# Patient Record
Sex: Male | Born: 1960 | Race: Black or African American | Hispanic: No | Marital: Single | Smoking: Current every day smoker
Health system: Southern US, Community
[De-identification: ages and names within clinical notes are randomized; demographics above are authoritative.]

## PROBLEM LIST (undated history)

## (undated) DIAGNOSIS — M86171 Other acute osteomyelitis, right ankle and foot: Secondary | ICD-10-CM

## (undated) DIAGNOSIS — Z86718 Personal history of other venous thrombosis and embolism: Secondary | ICD-10-CM

## (undated) DIAGNOSIS — F329 Major depressive disorder, single episode, unspecified: Secondary | ICD-10-CM

## (undated) DIAGNOSIS — F4321 Adjustment disorder with depressed mood: Secondary | ICD-10-CM

## (undated) DIAGNOSIS — F1011 Alcohol abuse, in remission: Secondary | ICD-10-CM

## (undated) DIAGNOSIS — F1491 Cocaine use, unspecified, in remission: Secondary | ICD-10-CM

## (undated) DIAGNOSIS — Z720 Tobacco use: Secondary | ICD-10-CM

## (undated) DIAGNOSIS — Z86010 Personal history of colonic polyps: Secondary | ICD-10-CM

## (undated) DIAGNOSIS — I739 Peripheral vascular disease, unspecified: Secondary | ICD-10-CM

## (undated) DIAGNOSIS — I8390 Asymptomatic varicose veins of unspecified lower extremity: Secondary | ICD-10-CM

## (undated) DIAGNOSIS — G629 Polyneuropathy, unspecified: Secondary | ICD-10-CM

## (undated) DIAGNOSIS — J302 Other seasonal allergic rhinitis: Secondary | ICD-10-CM

## (undated) DIAGNOSIS — I499 Cardiac arrhythmia, unspecified: Secondary | ICD-10-CM

## (undated) DIAGNOSIS — IMO0002 Reserved for concepts with insufficient information to code with codable children: Secondary | ICD-10-CM

## (undated) DIAGNOSIS — Z8631 Personal history of diabetic foot ulcer: Secondary | ICD-10-CM

## (undated) DIAGNOSIS — I10 Essential (primary) hypertension: Secondary | ICD-10-CM

## (undated) DIAGNOSIS — Z87898 Personal history of other specified conditions: Secondary | ICD-10-CM

## (undated) DIAGNOSIS — I480 Paroxysmal atrial fibrillation: Secondary | ICD-10-CM

## (undated) DIAGNOSIS — E1165 Type 2 diabetes mellitus with hyperglycemia: Secondary | ICD-10-CM

## (undated) HISTORY — PX: COLONOSCOPY: SHX174

## (undated) HISTORY — DX: Essential (primary) hypertension: I10

## (undated) HISTORY — DX: Personal history of diabetic foot ulcer: Z86.31

## (undated) HISTORY — DX: Personal history of other venous thrombosis and embolism: Z86.718

## (undated) HISTORY — DX: Personal history of colonic polyps: Z86.010

## (undated) HISTORY — DX: Polyneuropathy, unspecified: G62.9

---

## 1998-08-17 ENCOUNTER — Emergency Department (HOSPITAL_COMMUNITY): Admission: EM | Admit: 1998-08-17 | Discharge: 1998-08-17 | Payer: Self-pay | Admitting: Emergency Medicine

## 1998-08-17 ENCOUNTER — Encounter: Payer: Self-pay | Admitting: Emergency Medicine

## 1998-08-23 ENCOUNTER — Encounter: Admission: RE | Admit: 1998-08-23 | Discharge: 1998-08-23 | Payer: Self-pay | Admitting: Internal Medicine

## 1998-08-23 ENCOUNTER — Encounter: Payer: Self-pay | Admitting: Emergency Medicine

## 1998-08-23 ENCOUNTER — Ambulatory Visit (HOSPITAL_COMMUNITY): Admission: RE | Admit: 1998-08-23 | Discharge: 1998-08-23 | Payer: Self-pay | Admitting: Emergency Medicine

## 1998-09-06 ENCOUNTER — Encounter: Admission: RE | Admit: 1998-09-06 | Discharge: 1998-09-06 | Payer: Self-pay | Admitting: Hematology and Oncology

## 1998-09-20 ENCOUNTER — Encounter: Admission: RE | Admit: 1998-09-20 | Discharge: 1998-09-20 | Payer: Self-pay | Admitting: Internal Medicine

## 1998-11-15 ENCOUNTER — Encounter: Admission: RE | Admit: 1998-11-15 | Discharge: 1998-11-15 | Payer: Self-pay | Admitting: Internal Medicine

## 1998-11-22 ENCOUNTER — Encounter: Admission: RE | Admit: 1998-11-22 | Discharge: 1998-11-22 | Payer: Self-pay | Admitting: Hematology and Oncology

## 1998-11-29 ENCOUNTER — Encounter: Admission: RE | Admit: 1998-11-29 | Discharge: 1998-11-29 | Payer: Self-pay | Admitting: Internal Medicine

## 1998-12-13 ENCOUNTER — Encounter: Admission: RE | Admit: 1998-12-13 | Discharge: 1998-12-13 | Payer: Self-pay | Admitting: Internal Medicine

## 1998-12-20 ENCOUNTER — Encounter: Admission: RE | Admit: 1998-12-20 | Discharge: 1998-12-20 | Payer: Self-pay | Admitting: Internal Medicine

## 1998-12-27 ENCOUNTER — Encounter: Admission: RE | Admit: 1998-12-27 | Discharge: 1998-12-27 | Payer: Self-pay | Admitting: Internal Medicine

## 1999-01-10 ENCOUNTER — Encounter: Admission: RE | Admit: 1999-01-10 | Discharge: 1999-01-10 | Payer: Self-pay | Admitting: Internal Medicine

## 1999-10-04 ENCOUNTER — Encounter: Admission: RE | Admit: 1999-10-04 | Discharge: 1999-10-04 | Payer: Self-pay | Admitting: Internal Medicine

## 1999-11-01 ENCOUNTER — Encounter: Admission: RE | Admit: 1999-11-01 | Discharge: 1999-11-01 | Payer: Self-pay | Admitting: Internal Medicine

## 2000-11-30 ENCOUNTER — Encounter: Admission: RE | Admit: 2000-11-30 | Discharge: 2000-11-30 | Payer: Self-pay | Admitting: Internal Medicine

## 2001-03-27 ENCOUNTER — Encounter: Admission: RE | Admit: 2001-03-27 | Discharge: 2001-03-27 | Payer: Self-pay

## 2001-05-21 ENCOUNTER — Ambulatory Visit (HOSPITAL_COMMUNITY): Admission: RE | Admit: 2001-05-21 | Discharge: 2001-05-21 | Payer: Self-pay | Admitting: *Deleted

## 2002-09-14 ENCOUNTER — Emergency Department (HOSPITAL_COMMUNITY): Admission: EM | Admit: 2002-09-14 | Discharge: 2002-09-14 | Payer: Self-pay | Admitting: Emergency Medicine

## 2002-11-19 ENCOUNTER — Encounter: Admission: RE | Admit: 2002-11-19 | Discharge: 2002-11-19 | Payer: Self-pay | Admitting: Internal Medicine

## 2003-05-11 ENCOUNTER — Encounter: Admission: RE | Admit: 2003-05-11 | Discharge: 2003-05-11 | Payer: Self-pay | Admitting: Internal Medicine

## 2003-06-04 ENCOUNTER — Ambulatory Visit (HOSPITAL_COMMUNITY): Admission: RE | Admit: 2003-06-04 | Discharge: 2003-06-04 | Payer: Self-pay | Admitting: Family Medicine

## 2003-06-17 ENCOUNTER — Encounter (HOSPITAL_BASED_OUTPATIENT_CLINIC_OR_DEPARTMENT_OTHER): Admission: RE | Admit: 2003-06-17 | Discharge: 2003-09-15 | Payer: Self-pay | Admitting: Internal Medicine

## 2004-09-19 ENCOUNTER — Ambulatory Visit: Payer: Self-pay | Admitting: Internal Medicine

## 2004-09-19 ENCOUNTER — Inpatient Hospital Stay (HOSPITAL_COMMUNITY): Admission: AD | Admit: 2004-09-19 | Discharge: 2004-09-22 | Payer: Self-pay | Admitting: Internal Medicine

## 2005-08-30 ENCOUNTER — Emergency Department (HOSPITAL_COMMUNITY): Admission: EM | Admit: 2005-08-30 | Discharge: 2005-08-30 | Payer: Self-pay | Admitting: Emergency Medicine

## 2006-01-10 ENCOUNTER — Emergency Department (HOSPITAL_COMMUNITY): Admission: EM | Admit: 2006-01-10 | Discharge: 2006-01-10 | Payer: Self-pay | Admitting: Emergency Medicine

## 2006-01-15 ENCOUNTER — Emergency Department (HOSPITAL_COMMUNITY): Admission: EM | Admit: 2006-01-15 | Discharge: 2006-01-15 | Payer: Self-pay | Admitting: Emergency Medicine

## 2006-03-14 ENCOUNTER — Emergency Department (HOSPITAL_COMMUNITY): Admission: EM | Admit: 2006-03-14 | Discharge: 2006-03-14 | Payer: Self-pay | Admitting: Family Medicine

## 2006-07-05 ENCOUNTER — Emergency Department (HOSPITAL_COMMUNITY): Admission: EM | Admit: 2006-07-05 | Discharge: 2006-07-05 | Payer: Self-pay | Admitting: Family Medicine

## 2006-07-18 ENCOUNTER — Ambulatory Visit: Payer: Self-pay | Admitting: Internal Medicine

## 2006-07-18 ENCOUNTER — Encounter (INDEPENDENT_AMBULATORY_CARE_PROVIDER_SITE_OTHER): Payer: Self-pay | Admitting: Unknown Physician Specialty

## 2006-07-18 DIAGNOSIS — Z9189 Other specified personal risk factors, not elsewhere classified: Secondary | ICD-10-CM | POA: Insufficient documentation

## 2006-07-18 DIAGNOSIS — L84 Corns and callosities: Secondary | ICD-10-CM | POA: Insufficient documentation

## 2006-07-18 DIAGNOSIS — B351 Tinea unguium: Secondary | ICD-10-CM

## 2006-07-18 DIAGNOSIS — E1165 Type 2 diabetes mellitus with hyperglycemia: Secondary | ICD-10-CM

## 2006-07-18 DIAGNOSIS — I872 Venous insufficiency (chronic) (peripheral): Secondary | ICD-10-CM | POA: Insufficient documentation

## 2006-07-18 LAB — CONVERTED CEMR LAB
Blood Glucose, Fingerstick: 487
Potassium: 4 meq/L (ref 3.5–5.3)
Sodium: 131 meq/L — ABNORMAL LOW (ref 135–145)

## 2006-07-24 ENCOUNTER — Encounter (INDEPENDENT_AMBULATORY_CARE_PROVIDER_SITE_OTHER): Payer: Self-pay | Admitting: *Deleted

## 2006-07-26 ENCOUNTER — Telehealth: Payer: Self-pay | Admitting: *Deleted

## 2009-06-06 ENCOUNTER — Emergency Department (HOSPITAL_COMMUNITY): Admission: EM | Admit: 2009-06-06 | Discharge: 2009-06-06 | Payer: Self-pay | Admitting: Emergency Medicine

## 2010-03-30 ENCOUNTER — Emergency Department (HOSPITAL_COMMUNITY): Admission: EM | Admit: 2010-03-30 | Discharge: 2010-03-30 | Payer: Self-pay | Admitting: Emergency Medicine

## 2010-04-01 ENCOUNTER — Emergency Department (HOSPITAL_COMMUNITY): Admission: EM | Admit: 2010-04-01 | Discharge: 2010-04-01 | Payer: Self-pay | Admitting: Family Medicine

## 2010-04-11 ENCOUNTER — Emergency Department (HOSPITAL_COMMUNITY): Admission: EM | Admit: 2010-04-11 | Discharge: 2010-04-11 | Payer: Self-pay | Admitting: Emergency Medicine

## 2010-08-24 LAB — POCT I-STAT, CHEM 8
Glucose, Bld: 327 mg/dL — ABNORMAL HIGH (ref 70–99)
HCT: 51 % (ref 39.0–52.0)
Hemoglobin: 17.3 g/dL — ABNORMAL HIGH (ref 13.0–17.0)
Potassium: 4.1 mEq/L (ref 3.5–5.1)
Sodium: 135 mEq/L (ref 135–145)

## 2010-08-24 LAB — CULTURE, ROUTINE-ABSCESS: Gram Stain: NONE SEEN

## 2010-08-24 LAB — GLUCOSE, CAPILLARY: Glucose-Capillary: 312 mg/dL — ABNORMAL HIGH (ref 70–99)

## 2010-09-12 LAB — COMPREHENSIVE METABOLIC PANEL
ALT: 34 U/L (ref 0–53)
AST: 61 U/L — ABNORMAL HIGH (ref 0–37)
CO2: 24 mEq/L (ref 19–32)
Chloride: 100 mEq/L (ref 96–112)
GFR calc Af Amer: >60 mL/min (ref >60)
GFR calc non Af Amer: >60 mL/min (ref >60)
Potassium: 3.8 mEq/L (ref 3.5–5.1)
Sodium: 138 mEq/L (ref 135–145)
Total Bilirubin: 0.7 mg/dL (ref 0.3–1.2)

## 2010-09-12 LAB — URINALYSIS, ROUTINE W REFLEX MICROSCOPIC
Hgb urine dipstick: NEGATIVE
Nitrite: NEGATIVE
Specific Gravity, Urine: 1.037 — ABNORMAL HIGH (ref 1.005–1.030)
Urobilinogen, UA: 1 mg/dL (ref 0.0–1.0)
pH: 5.5 (ref 5.0–8.0)

## 2010-09-12 LAB — RAPID URINE DRUG SCREEN, HOSP PERFORMED
Barbiturates: NOT DETECTED
Benzodiazepines: NOT DETECTED
Opiates: NOT DETECTED

## 2010-09-12 LAB — ETHANOL: Alcohol, Ethyl (B): 162 mg/dL — ABNORMAL HIGH (ref 0–10)

## 2010-09-12 LAB — DIFFERENTIAL
Basophils Relative: 2 % — ABNORMAL HIGH (ref 0–1)
Eosinophils Relative: 0 % (ref 0–5)
Monocytes Absolute: 0.2 10*3/uL (ref 0.1–1.0)

## 2010-09-12 LAB — CBC
RBC: 5.39 MIL/uL (ref 4.22–5.81)
WBC: 7.3 10*3/uL (ref 4.0–10.5)

## 2010-09-12 LAB — URINE MICROSCOPIC-ADD ON

## 2010-12-22 ENCOUNTER — Ambulatory Visit (INDEPENDENT_AMBULATORY_CARE_PROVIDER_SITE_OTHER): Payer: Medicaid Other | Admitting: Family Medicine

## 2010-12-22 ENCOUNTER — Encounter: Payer: Self-pay | Admitting: Family Medicine

## 2010-12-22 DIAGNOSIS — E1169 Type 2 diabetes mellitus with other specified complication: Secondary | ICD-10-CM

## 2010-12-22 DIAGNOSIS — I10 Essential (primary) hypertension: Secondary | ICD-10-CM

## 2010-12-22 DIAGNOSIS — E11621 Type 2 diabetes mellitus with foot ulcer: Secondary | ICD-10-CM

## 2010-12-22 DIAGNOSIS — L97509 Non-pressure chronic ulcer of other part of unspecified foot with unspecified severity: Secondary | ICD-10-CM

## 2010-12-22 DIAGNOSIS — E119 Type 2 diabetes mellitus without complications: Secondary | ICD-10-CM

## 2010-12-22 LAB — CBC
Platelets: 210 10*3/uL (ref 150–400)
RDW: 13.5 % (ref 11.5–15.5)
WBC: 4.6 10*3/uL (ref 4.0–10.5)

## 2010-12-22 LAB — COMPREHENSIVE METABOLIC PANEL
ALT: 21 U/L (ref 0–53)
CO2: 24 mEq/L (ref 19–32)
Calcium: 10 mg/dL (ref 8.4–10.5)
Chloride: 102 mEq/L (ref 96–112)
Sodium: 135 mEq/L (ref 135–145)
Total Protein: 7.4 g/dL (ref 6.0–8.3)

## 2010-12-22 LAB — POCT GLYCOSYLATED HEMOGLOBIN (HGB A1C): Hemoglobin A1C: 9.8

## 2010-12-22 NOTE — Patient Instructions (Signed)
I will let you know the results of your labs and whether we need to adjust your medications.  I would like for you to see me again in a month to see how you are doing.  We are sending over a referral to podiatry to have your feet checked.

## 2010-12-22 NOTE — Progress Notes (Signed)
  Subjective:    Patient ID: Steven [name], male    DOB: Nov 20, 1960, 50 y.o.   MRN: [id number]  HPI Patient is here because he needs a regular doctor to follow his diabetes and want to be seen for his feet. Patient reports that he has had diabetes for a few years, does not check his blood sugars at home.  Currently only on Metformin but states he has been out >56months.  Does have history of sores on the bottom of his feet and on his legs in the past.  Currently he denies chest pain, increased thirst or urination, headache, nausea, vomiting, vision changes.  Does report a sore on the bottom of his foot currently that has been there for a few months.  Area has thick callus but he says is painful.  Denies any foul odor, drainage, bleeding, fever, or chills.    Review of Systems SEE HPI    Objective:   Physical Exam  Constitutional: He appears well-nourished. No distress.       Disheveled appearing  HENT:  Head: Normocephalic and atraumatic.  Eyes: Conjunctivae are normal. Pupils are equal, round, and reactive to light.  Cardiovascular: Normal rate, regular rhythm and normal heart sounds.   Pulmonary/Chest: Effort normal and breath sounds normal.  Musculoskeletal: Normal range of motion. He exhibits no edema.  Skin:       Large area on bottom of foot that is callused over but tender to touch.  No drainage.   Legs with chronic venous stasis changes bilaterally.  Psychiatric:       Very poor historian and has difficulty with medical information.  Insight and judgement seem very poor.          Assessment & Plan:

## 2010-12-25 ENCOUNTER — Encounter: Payer: Self-pay | Admitting: Family Medicine

## 2010-12-26 NOTE — Assessment & Plan Note (Signed)
Thickened callus with tenderness suspicious for underlying foot ulcer.  No signs of systemic infection at this time.  WIll refer to podiatry for further management as well as evaluation of onychomycosis.

## 2010-12-26 NOTE — Assessment & Plan Note (Signed)
Will get A1C today to assess level of glycemic control.  I suspect this will be high given the unknown amount of time he has been out of his medications.  Will contact his pharmacy to see when his last fill was.

## 2010-12-26 NOTE — Assessment & Plan Note (Addendum)
Blood pressure normal today, patient reports he has been on medication for this in the past.  Will contact pharmacy to see what this may be.   Will get chemistry today, if Cr looks ok would likely benefit from Ace-I given comorbidity of DMII.  Given red flags.

## 2010-12-29 ENCOUNTER — Telehealth: Payer: Self-pay | Admitting: Family Medicine

## 2010-12-29 MED ORDER — METFORMIN HCL 1000 MG PO TABS: 1000.0000 mg | ORAL_TABLET | Freq: Two times a day (BID) | ORAL | Status: DC

## 2010-12-29 NOTE — Telephone Encounter (Signed)
Spoke with Mr. Devereux about increasing Metformin, patient agreeable.  Reminded him that I wanted to see him again within one month of previous appt.to see how he is doing, patient agreeable to this as well.  Rx for metformin phoned in to pharmacy.

## 2011-03-08 ENCOUNTER — Inpatient Hospital Stay (INDEPENDENT_AMBULATORY_CARE_PROVIDER_SITE_OTHER)
Admission: RE | Admit: 2011-03-08 | Discharge: 2011-03-08 | Disposition: A | Discharge status: Home / Self Care | Payer: Medicaid Other | Source: Ambulatory Visit | Attending: Emergency Medicine | Admitting: Emergency Medicine

## 2011-03-08 DIAGNOSIS — R05 Cough: Secondary | ICD-10-CM

## 2011-03-08 DIAGNOSIS — M25519 Pain in unspecified shoulder: Secondary | ICD-10-CM

## 2011-03-08 DIAGNOSIS — J069 Acute upper respiratory infection, unspecified: Secondary | ICD-10-CM

## 2011-03-23 ENCOUNTER — Encounter: Payer: Self-pay | Admitting: Family Medicine

## 2011-03-23 ENCOUNTER — Ambulatory Visit (INDEPENDENT_AMBULATORY_CARE_PROVIDER_SITE_OTHER): Payer: Medicaid Other | Admitting: Family Medicine

## 2011-03-23 VITALS — BP 161/100 | HR 88 | Temp 98.1°F | Wt 216.9 lb

## 2011-03-23 DIAGNOSIS — F1010 Alcohol abuse, uncomplicated: Secondary | ICD-10-CM

## 2011-03-23 DIAGNOSIS — R079 Chest pain, unspecified: Secondary | ICD-10-CM

## 2011-03-23 DIAGNOSIS — E119 Type 2 diabetes mellitus without complications: Secondary | ICD-10-CM

## 2011-03-23 DIAGNOSIS — L97509 Non-pressure chronic ulcer of other part of unspecified foot with unspecified severity: Secondary | ICD-10-CM

## 2011-03-23 DIAGNOSIS — M25519 Pain in unspecified shoulder: Secondary | ICD-10-CM | POA: Insufficient documentation

## 2011-03-23 DIAGNOSIS — Z23 Encounter for immunization: Secondary | ICD-10-CM

## 2011-03-23 DIAGNOSIS — E1169 Type 2 diabetes mellitus with other specified complication: Secondary | ICD-10-CM

## 2011-03-23 DIAGNOSIS — I10 Essential (primary) hypertension: Secondary | ICD-10-CM

## 2011-03-23 DIAGNOSIS — R0789 Other chest pain: Secondary | ICD-10-CM | POA: Insufficient documentation

## 2011-03-23 DIAGNOSIS — E11621 Type 2 diabetes mellitus with foot ulcer: Secondary | ICD-10-CM

## 2011-03-23 DIAGNOSIS — R0781 Pleurodynia: Secondary | ICD-10-CM

## 2011-03-23 LAB — POCT GLYCOSYLATED HEMOGLOBIN (HGB A1C): Hemoglobin A1C: 9.1

## 2011-03-23 MED ORDER — TRAMADOL HCL 50 MG PO TABS: 50.0000 mg | ORAL_TABLET | Freq: Four times a day (QID) | ORAL | Status: AC | PRN

## 2011-03-23 MED ORDER — METHOCARBAMOL 750 MG PO TABS: 750.0000 mg | ORAL_TABLET | Freq: Four times a day (QID) | ORAL | Status: AC

## 2011-03-23 MED ORDER — LISINOPRIL 10 MG PO TABS: 10.0000 mg | ORAL_TABLET | Freq: Every day | ORAL | Status: DC

## 2011-03-23 NOTE — Patient Instructions (Signed)
It was good seeing you today.   Please go to the main hospital to get your chest x ray. I have sent over a blood pressure medicine called lisinopril please make an appointment to come back for lab work in one week to check your kidney function For your shoulder try the tramadol and methacarbamol for the pain.  I think the pain is coming from your neck, if this continues we should probably get some pictures of your neck. For your foot, please call your podiatrist to schedule a follow up appointment.  If you have questions please call our office.

## 2011-03-28 NOTE — Progress Notes (Signed)
  Subjective:    Patient ID: Steven [name], male    DOB: December 23, 1960, 50 y.o.   MRN: [id number]  HPI  1. Diabetes mellitus:  Comes in as a walk-in today like to followup on multiple things. For his diabetes he is currently only on metformin. Has been fairly noncompliant with his medications in the past. He does admit to missing occasional doses of his metformin. He has been in the process of moving and has been quite forgetful of a lot of his medications and been unable to take his blood sugars recently. 2. Foot ulcer: has had chronic foot ulceration of the right foot with thick callus formation. Originally referred to podiatry but has been unable to followup due to him being in the process of moving. Area on sole of foot is painful at times. Denies fever, chills, nausea, vomiting. 3. Shoulder pain: Has had shoulder pain for the past 3 weeks. Pain is located in the upper left trapezius. Denies any neck pain. He does admit to getting intoxicated and falling. Unsure that this is related to his current pain. Pain is mostly a throbbing pain with some radiation into the deltoid area. No numbness or tingling down the arm. 4. Rib pain: Has pain in his lower ribs on the right side. As above he did have a fairly recent fall after becoming intoxicated. He Does become intoxicated quite often especially on the weekends. Denies shortness of breath.  5. Hypertension: Blood pressure elevated today. He does have a history of hypertension in the past. Unsure of medications he  Has been on before. He denies headache, weakness, nausea, chest pain, shortness of breath, palpitations.  Review of Systems     Objective:   Physical Exam  Constitutional:       Disheveled appearing, NAD  HENT:  Head: Normocephalic and atraumatic.  Neck: Normal range of motion. Neck supple.       No bony tenderness   Cardiovascular: Normal rate, regular rhythm and normal heart sounds.  Exam reveals no gallop and no friction rub.   No  murmur heard. Pulmonary/Chest: Effort normal and breath sounds normal. No respiratory distress. He has no wheezes. He has no rales. He exhibits tenderness.       R lower ribs tender  Musculoskeletal:       Left shoulder: He exhibits tenderness. He exhibits normal range of motion and no bony tenderness.       AROM is full with pain PROM full and w/o pain Tenderness and spasm in in upper trapezius Hawkins and neer negative O'briens and empty can negativ Scarf negative Strength 5/5 Sensation intact. Biceps, Triceps and brachioradialis reflexes 2+ a  Skin:             Assessment & Plan:

## 2011-03-29 DIAGNOSIS — F1010 Alcohol abuse, uncomplicated: Secondary | ICD-10-CM | POA: Insufficient documentation

## 2011-03-29 NOTE — Assessment & Plan Note (Signed)
Hemoglobin A1c shows slightly improved glycemic control since increasing metformin 1000 mg. He is still suboptimally controlled will likely benefit from a  Sulfonylurea.  Question his compliance with his medications. Will call to discuss this with him.

## 2011-03-29 NOTE — Assessment & Plan Note (Signed)
BP elevated on today's visit, he has had a history of hypertension in the past.   I will add on lisinopril today for blood pressure control as well as renal prophylaxis with his diabetes he. I explained to him numerous times during the visit the importance of returning in one week to have his renal function rechecked with This new medication And to recheck his blood pressure.

## 2011-03-29 NOTE — Assessment & Plan Note (Signed)
Rib pain with recent fall concerning for possible rib fracture. Chest x-ray ordered to evaluate for this.  Given Red flags to look for at home.

## 2011-03-29 NOTE — Assessment & Plan Note (Signed)
Still persistent diabetic ulcer on the sole of right foot. I did trim away part of the callus and dead skin using a #15 blade today.   Advised him numerous times in the visit that he should revisit with his podiatrist Before this turns into an infected wound. He states that he will call his podiatrist once he returns home to schedule this appointment.

## 2011-03-29 NOTE — Assessment & Plan Note (Signed)
Good stability and good rotator cuff function. I think that his pain is related to Possible cervical disc disease or simply trapezius spasm. Will treat acutely with muscle relaxants as well as tramadol.

## 2011-03-29 NOTE — Assessment & Plan Note (Signed)
Patient with episodes of binge drinking on the weekends. He has had recent episodes of becoming so intoxicated he has fallen. I advised him on the adverse effects of alcohol and that he may or to consider cutting back his alcohol intake.

## 2011-04-17 ENCOUNTER — Encounter (HOSPITAL_COMMUNITY): Payer: Self-pay | Admitting: Emergency Medicine

## 2011-04-17 ENCOUNTER — Inpatient Hospital Stay (HOSPITAL_COMMUNITY)
Admission: EM | Admit: 2011-04-17 | Discharge: 2011-04-25 | DRG: 617 | Disposition: A | Discharge status: Home Health | Payer: Medicaid Other | Attending: Family Medicine | Admitting: Family Medicine

## 2011-04-17 ENCOUNTER — Emergency Department (HOSPITAL_COMMUNITY): Payer: Medicaid Other

## 2011-04-17 DIAGNOSIS — M9080 Osteopathy in diseases classified elsewhere, unspecified site: Secondary | ICD-10-CM | POA: Diagnosis present

## 2011-04-17 DIAGNOSIS — N179 Acute kidney failure, unspecified: Secondary | ICD-10-CM | POA: Diagnosis not present

## 2011-04-17 DIAGNOSIS — I872 Venous insufficiency (chronic) (peripheral): Secondary | ICD-10-CM | POA: Diagnosis present

## 2011-04-17 DIAGNOSIS — E1169 Type 2 diabetes mellitus with other specified complication: Principal | ICD-10-CM | POA: Diagnosis present

## 2011-04-17 DIAGNOSIS — L02619 Cutaneous abscess of unspecified foot: Secondary | ICD-10-CM | POA: Diagnosis present

## 2011-04-17 DIAGNOSIS — E871 Hypo-osmolality and hyponatremia: Secondary | ICD-10-CM

## 2011-04-17 DIAGNOSIS — L97909 Non-pressure chronic ulcer of unspecified part of unspecified lower leg with unspecified severity: Secondary | ICD-10-CM

## 2011-04-17 DIAGNOSIS — L97509 Non-pressure chronic ulcer of other part of unspecified foot with unspecified severity: Secondary | ICD-10-CM | POA: Diagnosis present

## 2011-04-17 DIAGNOSIS — M869 Osteomyelitis, unspecified: Secondary | ICD-10-CM | POA: Diagnosis present

## 2011-04-17 DIAGNOSIS — F17200 Nicotine dependence, unspecified, uncomplicated: Secondary | ICD-10-CM | POA: Diagnosis present

## 2011-04-17 DIAGNOSIS — I10 Essential (primary) hypertension: Secondary | ICD-10-CM | POA: Insufficient documentation

## 2011-04-17 DIAGNOSIS — E1165 Type 2 diabetes mellitus with hyperglycemia: Secondary | ICD-10-CM | POA: Insufficient documentation

## 2011-04-17 DIAGNOSIS — E785 Hyperlipidemia, unspecified: Secondary | ICD-10-CM | POA: Diagnosis present

## 2011-04-17 DIAGNOSIS — I739 Peripheral vascular disease, unspecified: Secondary | ICD-10-CM | POA: Diagnosis present

## 2011-04-17 DIAGNOSIS — F1210 Cannabis abuse, uncomplicated: Secondary | ICD-10-CM | POA: Diagnosis present

## 2011-04-17 DIAGNOSIS — R062 Wheezing: Secondary | ICD-10-CM | POA: Diagnosis not present

## 2011-04-17 DIAGNOSIS — F1010 Alcohol abuse, uncomplicated: Secondary | ICD-10-CM | POA: Diagnosis present

## 2011-04-17 DIAGNOSIS — IMO0002 Reserved for concepts with insufficient information to code with codable children: Principal | ICD-10-CM | POA: Diagnosis present

## 2011-04-17 DIAGNOSIS — Z86718 Personal history of other venous thrombosis and embolism: Secondary | ICD-10-CM

## 2011-04-17 DIAGNOSIS — E11621 Type 2 diabetes mellitus with foot ulcer: Secondary | ICD-10-CM | POA: Insufficient documentation

## 2011-04-17 DIAGNOSIS — E118 Type 2 diabetes mellitus with unspecified complications: Secondary | ICD-10-CM

## 2011-04-17 HISTORY — DX: Peripheral vascular disease, unspecified: I73.9

## 2011-04-17 LAB — CBC
MCH: 30.6 pg (ref 26.0–34.0)
Platelets: 215 10*3/uL (ref 150–400)
RBC: 5.04 MIL/uL (ref 4.22–5.81)
WBC: 15.7 10*3/uL — ABNORMAL HIGH (ref 4.0–10.5)

## 2011-04-17 LAB — GLUCOSE, CAPILLARY: Glucose-Capillary: 208 mg/dL — ABNORMAL HIGH (ref 70–99)

## 2011-04-17 LAB — DIFFERENTIAL
Eosinophils Absolute: 0 10*3/uL (ref 0.0–0.7)
Lymphocytes Relative: 14 % (ref 12–46)
Lymphs Abs: 2.1 10*3/uL (ref 0.7–4.0)
Neutrophils Relative %: 75 % (ref 43–77)

## 2011-04-17 LAB — BASIC METABOLIC PANEL
Calcium: 10.3 mg/dL (ref 8.4–10.5)
GFR calc non Af Amer: >90 mL/min (ref >90)
Sodium: 132 mEq/L — ABNORMAL LOW (ref 135–145)

## 2011-04-17 MED ORDER — MORPHINE SULFATE 4 MG/ML IJ SOLN
4.0000 mg | Freq: Once | INTRAMUSCULAR | Status: AC
  Administered 2011-04-17: 4 mg via INTRAVENOUS
  Filled 2011-04-17: qty 2

## 2011-04-17 MED ORDER — SODIUM CHLORIDE 0.9 % IV SOLN
Freq: Once | INTRAVENOUS | Status: AC
  Administered 2011-04-17: 23:00:00 via INTRAVENOUS

## 2011-04-17 MED ORDER — PIPERACILLIN-TAZOBACTAM 3.375 G IVPB
3.3750 g | Freq: Once | INTRAVENOUS | Status: AC
  Administered 2011-04-17: 3.375 g via INTRAVENOUS
  Filled 2011-04-17: qty 50

## 2011-04-17 MED ORDER — VANCOMYCIN HCL IN DEXTROSE 1-5 GM/200ML-% IV SOLN
1000.0000 mg | Freq: Once | INTRAVENOUS | Status: AC
  Administered 2011-04-17: 1000 mg via INTRAVENOUS

## 2011-04-17 NOTE — ED Provider Notes (Signed)
History     CSN: [id number] Arrival date & time: 04/17/2011  9:14 PM   First MD Initiated Contact with Patient 04/17/11 2122      Chief Complaint  Patient presents with  . Foot Ulcer    (Consider location/radiation/quality/duration/timing/severity/associated sxs/prior treatment) Patient is a 50 y.o. male presenting with lower extremity pain. The history is provided by the patient.  Foot Pain The current episode started more than 1 month ago. Associated symptoms include chills. Pertinent negatives include no nausea or vomiting. Associated symptoms comments: The patient has been being treated by [facility] for right plantar ulcer x 2 months. Now worse, with foul odor and increased pain and drainage x 2-3 days.. The symptoms are aggravated by nothing. The treatment provided no relief.    Past Medical History  Diagnosis Date  . DM (diabetes mellitus)   . HTN (hypertension)   . History of DVT (deep vein thrombosis)   . Personal history of diabetic foot ulcer     History reviewed. No pertinent past surgical history.  Family History  Problem Relation Age of Onset  . Diabetes Mother   . Stroke Brother     History  Substance Use Topics  . Smoking status: Current Everyday Smoker -- 0.5 packs/day    Types: Cigarettes  . Smokeless tobacco: Never Used  . Alcohol Use: 2.5 - 3.0 oz/week    5-6 drink(s) per week     Beer and liquor      Review of Systems  Constitutional: Positive for chills.  HENT: Negative.   Respiratory: Negative.   Cardiovascular: Negative.   Gastrointestinal: Negative for nausea and vomiting.  Skin:       See HPI.  Neurological: Negative.     Allergies  Review of patient's allergies indicates no known allergies.  Home Medications   Current Outpatient Rx  Name Route Sig Dispense Refill  . IBUPROFEN 200 MG PO TABS Oral Take 400 mg by mouth every 6 (six) hours as needed. For pain     . LISINOPRIL 10 MG PO TABS Oral Take 10 mg by mouth daily.       Marland Kitchen METFORMIN HCL 1000 MG PO TABS Oral Take 1,000 mg by mouth 2 (two) times daily with a meal.        BP 155/73  Pulse 67  Temp(Src) 99.7 F (37.6 C) (Oral)  Resp 18  SpO2 99%  Physical Exam  Constitutional: He is oriented to person, place, and time. He appears well-developed and well-nourished.  Neck: Normal range of motion.  Pulmonary/Chest: Effort normal.  Neurological: He is alert and oriented to person, place, and time.  Skin:       Ulceration of right foot on plantar surface overlying 3rd MTP. No bleeding. Active, malodorous drainage. Minimally tender. No cellulitic changes.    ED Course  Procedures (including critical care time)  Labs Reviewed  GLUCOSE, CAPILLARY - Abnormal; Notable for the following:    Glucose-Capillary 208 (*)    All other components within normal limits  BASIC METABOLIC PANEL - Abnormal; Notable for the following:    Sodium 132 (*)    Chloride 95 (*)    Glucose, Bld 196 (*)    All other components within normal limits  CBC - Abnormal; Notable for the following:    WBC 15.7 (*)    All other components within normal limits  DIFFERENTIAL - Abnormal; Notable for the following:    Neutro Abs 11.8 (*)    Monocytes Absolute 1.7 (*)  All other components within normal limits  POCT CBG MONITORING   Dg Foot Complete Right  04/17/2011  *RADIOLOGY REPORT*  Clinical Data: Soft tissue wound under right foot.  RIGHT FOOT COMPLETE - 3+ VIEW  Comparison: None.  Findings: Soft tissue defect under the plantar are surface of the forefoot is noted.  No destructive bone lesion.  No evidence of aggressive periosteal reaction.  There is a fracture line with evidence of healing at the base of the distal phalanx of the great toe.  Bony framework is otherwise intact.  Degenerative changes are noted.  There is marked deformity of the distal fibula worrisome for post-traumatic changes.  IMPRESSION: No definite evidence of osteomyelitis.  Healing fracture involving the  distal phalanx of the great toe.  Post-traumatic changes involving the distal fibula.  Original Report Authenticated By: Donavan Burnet, M.D.     No diagnosis found.    MDM          Rodena Medin, PA 04/23/11 [phone]

## 2011-04-17 NOTE — ED Notes (Signed)
Pt reports wound to bottom of rt foot for approx x2 months - pt admits to occasional chills at home, foul smelling drainage from wound site. Pt [facility] at which point pt was referred to come to ED for eval. Pt w/ wound to bottom of rt foot under second toe - wound is approx 3cmx3cmx2cm w/ yellow purulent drainage and a black center. Pt A&Ox4, in no acute distress, reports 10/10 pain to rt foot. Pt provided w/ sandwich and PO fluids.

## 2011-04-17 NOTE — ED Provider Notes (Signed)
Pt has hx of diabetes, has been going to the wound center for an ulcer on the sole of his right foot. They saw him today and sent him to the ED for evaluation, he has started to have drainage and foul smell. Unaware of fevers. Pt is poor historian. Pt has an ulcer on the sole of his right foot under the MT of his second toe with some redness surrounding it.   Medical screening examination/treatment/procedure(s) were conducted as a shared visit with non-physician practitioner(s) and myself.  I personally evaluated the patient during the encounter Devoria Albe, MD, Franz Dell, MD 04/17/11 2306

## 2011-04-17 NOTE — ED Notes (Signed)
Pt sent by PCP for eval of right foot diabetic ulcer x 3 months that may be infected

## 2011-04-18 ENCOUNTER — Encounter (HOSPITAL_COMMUNITY): Payer: Self-pay | Admitting: Family Medicine

## 2011-04-18 DIAGNOSIS — L97409 Non-pressure chronic ulcer of unspecified heel and midfoot with unspecified severity: Secondary | ICD-10-CM

## 2011-04-18 LAB — CBC
HCT: 41 % (ref 39.0–52.0)
HCT: 40.5 % (ref 39.0–52.0)
Hemoglobin: 14.3 g/dL (ref 13.0–17.0)
MCH: 30.5 pg (ref 26.0–34.0)
MCHC: 34.9 g/dL (ref 30.0–36.0)
MCHC: 33.8 g/dL (ref 30.0–36.0)
MCV: 87.5 fL (ref 78.0–100.0)
Platelets: 226 10*3/uL (ref 150–400)
RBC: 4.69 MIL/uL (ref 4.22–5.81)
RDW: 13.1 % (ref 11.5–15.5)
WBC: 12.9 10*3/uL — ABNORMAL HIGH (ref 4.0–10.5)

## 2011-04-18 LAB — COMPREHENSIVE METABOLIC PANEL
ALT: 7 U/L (ref 0–53)
Albumin: 2.8 g/dL — ABNORMAL LOW (ref 3.5–5.2)
Alkaline Phosphatase: 91 U/L (ref 39–117)
BUN: 7 mg/dL (ref 6–23)
Chloride: 97 mEq/L (ref 96–112)
GFR calc Af Amer: >90 mL/min (ref >90)
Glucose, Bld: 215 mg/dL — ABNORMAL HIGH (ref 70–99)
Potassium: 3.2 mEq/L — ABNORMAL LOW (ref 3.5–5.1)
Sodium: 133 mEq/L — ABNORMAL LOW (ref 135–145)
Total Bilirubin: 0.5 mg/dL (ref 0.3–1.2)

## 2011-04-18 LAB — GLUCOSE, CAPILLARY
Glucose-Capillary: 344 mg/dL — ABNORMAL HIGH (ref 70–99)
Glucose-Capillary: 232 mg/dL — ABNORMAL HIGH (ref 70–99)
Glucose-Capillary: 218 mg/dL — ABNORMAL HIGH (ref 70–99)

## 2011-04-18 LAB — LIPID PANEL
HDL: 40 mg/dL (ref >39)
LDL Cholesterol: 91 mg/dL (ref 0–99)
VLDL: 17 mg/dL (ref 0–40)

## 2011-04-18 MED ORDER — POTASSIUM CHLORIDE CRYS ER 20 MEQ PO TBCR
40.0000 meq | EXTENDED_RELEASE_TABLET | Freq: Once | ORAL | Status: AC
  Administered 2011-04-18: 40 meq via ORAL
  Filled 2011-04-18: qty 2

## 2011-04-18 MED ORDER — IBUPROFEN 400 MG PO TABS
400.0000 mg | ORAL_TABLET | Freq: Four times a day (QID) | ORAL | Status: DC | PRN
  Administered 2011-04-18 – 2011-04-21 (×10): 400 mg via ORAL
  Filled 2011-04-18 (×11): qty 1

## 2011-04-18 MED ORDER — PIPERACILLIN-TAZOBACTAM 3.375 G IVPB
3.3750 g | Freq: Three times a day (TID) | INTRAVENOUS | Status: DC
  Administered 2011-04-18 – 2011-04-22 (×12): 3.375 g via INTRAVENOUS
  Filled 2011-04-18 (×14): qty 50

## 2011-04-18 MED ORDER — LISINOPRIL 10 MG PO TABS
10.0000 mg | ORAL_TABLET | Freq: Every day | ORAL | Status: DC
  Administered 2011-04-18 – 2011-04-23 (×6): 10 mg via ORAL
  Filled 2011-04-18 (×6): qty 1

## 2011-04-18 MED ORDER — MORPHINE SULFATE 2 MG/ML IJ SOLN: 2.0000 mg | INTRAMUSCULAR | Status: DC | PRN

## 2011-04-18 MED ORDER — INSULIN ASPART 100 UNIT/ML ~~LOC~~ SOLN
0.0000 [IU] | Freq: Three times a day (TID) | SUBCUTANEOUS | Status: DC
  Administered 2011-04-18 – 2011-04-19 (×4): 3 [IU] via SUBCUTANEOUS
  Administered 2011-04-19: 2 [IU] via SUBCUTANEOUS
  Filled 2011-04-18: qty 3

## 2011-04-18 MED ORDER — MORPHINE SULFATE 2 MG/ML IJ SOLN
2.0000 mg | INTRAMUSCULAR | Status: DC | PRN
  Administered 2011-04-18 – 2011-04-21 (×11): 2 mg via INTRAVENOUS
  Filled 2011-04-18 (×11): qty 1

## 2011-04-18 MED ORDER — HEPARIN SODIUM (PORCINE) 5000 UNIT/ML IJ SOLN
5000.0000 [IU] | Freq: Three times a day (TID) | INTRAMUSCULAR | Status: DC
  Administered 2011-04-18 – 2011-04-25 (×17): 5000 [IU] via SUBCUTANEOUS
  Filled 2011-04-18 (×10): qty 1

## 2011-04-18 MED ORDER — THIAMINE HCL 100 MG/ML IJ SOLN
Freq: Once | INTRAVENOUS | Status: AC
  Administered 2011-04-18: 06:00:00 via INTRAVENOUS
  Filled 2011-04-18 (×2): qty 1000

## 2011-04-18 MED ORDER — VANCOMYCIN HCL IN DEXTROSE 1-5 GM/200ML-% IV SOLN
1000.0000 mg | Freq: Three times a day (TID) | INTRAVENOUS | Status: DC
  Administered 2011-04-18 – 2011-04-20 (×6): 1000 mg via INTRAVENOUS
  Filled 2011-04-18 (×10): qty 200

## 2011-04-18 NOTE — Progress Notes (Signed)
Clinical Social Worker attempted to complete psychosocial assessment but pt. was asleep. Clinical Social Worker will return later. Steven [name], MSW, Steven [name] [phone]

## 2011-04-18 NOTE — Consult Note (Signed)
Reason for Consult: Right foot ulcer with infection rule out osteomyelitis Referring Physician: Hospitalist service team  Steven [name] is an 50 y.o. male.  HPI: Patient is a 50 year old gentleman with a several month history of ulceration beneath the second and third metatarsal heads of the right foot patient is in pain swelling and drainage from the foot and presents at this time for evaluation and treatment. Patient has uncontrolled type 2 diabetes states he is on oral insulin at home.  Past Medical History  Diagnosis Date  . DM (diabetes mellitus)   . HTN (hypertension)   . History of DVT (deep vein thrombosis)   . Personal history of diabetic foot ulcer   . Peripheral vascular disease     History reviewed. No pertinent past surgical history.  Family History  Problem Relation Age of Onset  . Diabetes Mother   . Stroke Brother     Social History:  reports that he has been smoking Cigarettes.  He has a 15 pack-year smoking history. He has never used smokeless tobacco. He reports that he drinks about 2.5 - 3 ounces of alcohol per week. He reports that he uses illicit drugs (Marijuana) about twice per week.  Allergies: No Known Allergies  Medications: I have reviewed the patient's current medications.  Results for orders placed during the hospital encounter of 04/17/11 (from the past 48 hour(s))  GLUCOSE, CAPILLARY     Status: Abnormal   Collection Time   04/17/11  9:40 PM      Component Value Range Comment   Glucose-Capillary 208 (*) 70 - 99 (mg/dL)   CBC     Status: Abnormal   Collection Time   04/17/11 10:26 PM      Component Value Range Comment   WBC 15.7 (*) 4.0 - 10.5 (K/uL)    RBC 5.04  4.22 - 5.81 (MIL/uL)    Hemoglobin 15.4  13.0 - 17.0 (g/dL)    HCT 04.5  40.9 - 81.1 (%)    MCV 87.1  78.0 - 100.0 (fL)    MCH 30.6  26.0 - 34.0 (pg)    MCHC 35.1  30.0 - 36.0 (g/dL)    RDW 91.4  78.2 - 95.6 (%)    Platelets 215  150 - 400 (K/uL)   DIFFERENTIAL     Status:  Abnormal   Collection Time   04/17/11 10:26 PM      Component Value Range Comment   Neutrophils Relative 75  43 - 77 (%)    Neutro Abs 11.8 (*) 1.7 - 7.7 (K/uL)    Lymphocytes Relative 14  12 - 46 (%)    Lymphs Abs 2.1  0.7 - 4.0 (K/uL)    Monocytes Relative 11  3 - 12 (%)    Monocytes Absolute 1.7 (*) 0.1 - 1.0 (K/uL)    Eosinophils Relative 0  0 - 5 (%)    Eosinophils Absolute 0.0  0.0 - 0.7 (K/uL)    Basophils Relative 0  0 - 1 (%)    Basophils Absolute 0.0  0.0 - 0.1 (K/uL)   BASIC METABOLIC PANEL     Status: Abnormal   Collection Time   04/17/11 10:32 PM      Component Value Range Comment   Sodium 132 (*) 135 - 145 (mEq/L)    Potassium 3.7  3.5 - 5.1 (mEq/L)    Chloride 95 (*) 96 - 112 (mEq/L)    CO2 24  19 - 32 (mEq/L)    Glucose, Bld  196 (*) 70 - 99 (mg/dL)    BUN 7  6 - 23 (mg/dL)    Creatinine, Ser 0.98  0.50 - 1.35 (mg/dL)    Calcium 11.9  8.4 - 10.5 (mg/dL)    GFR calc non Af Amer >90  >90 (mL/min)    GFR calc Af Amer >90  >90 (mL/min)   CBC     Status: Abnormal   Collection Time   04/18/11  1:59 AM      Component Value Range Comment   WBC 14.8 (*) 4.0 - 10.5 (K/uL)    RBC 4.69  4.22 - 5.81 (MIL/uL)    Hemoglobin 14.3  13.0 - 17.0 (g/dL)    HCT 14.7  82.9 - 56.2 (%)    MCV 87.4  78.0 - 100.0 (fL)    MCH 30.5  26.0 - 34.0 (pg)    MCHC 34.9  30.0 - 36.0 (g/dL)    RDW 13.0  86.5 - 78.4 (%)    Platelets 211  150 - 400 (K/uL)   CREATININE, SERUM     Status: Normal   Collection Time   04/18/11  1:59 AM      Component Value Range Comment   Creatinine, Ser 0.61  0.50 - 1.35 (mg/dL)    GFR calc non Af Amer >90  >90 (mL/min)    GFR calc Af Amer >90  >90 (mL/min)   GLUCOSE, CAPILLARY     Status: Abnormal   Collection Time   04/18/11  2:52 AM      Component Value Range Comment   Glucose-Capillary 344 (*) 70 - 99 (mg/dL)   COMPREHENSIVE METABOLIC PANEL     Status: Abnormal   Collection Time   04/18/11  6:23 AM      Component Value Range Comment   Sodium 133 (*) 135 -  145 (mEq/L)    Potassium 3.2 (*) 3.5 - 5.1 (mEq/L)    Chloride 97  96 - 112 (mEq/L)    CO2 25  19 - 32 (mEq/L)    Glucose, Bld 215 (*) 70 - 99 (mg/dL)    BUN 7  6 - 23 (mg/dL)    Creatinine, Ser 6.96  0.50 - 1.35 (mg/dL)    Calcium 9.6  8.4 - 10.5 (mg/dL)    Total Protein 6.9  6.0 - 8.3 (g/dL)    Albumin 2.8 (*) 3.5 - 5.2 (g/dL)    AST 8  0 - 37 (U/L)    ALT 7  0 - 53 (U/L)    Alkaline Phosphatase 91  39 - 117 (U/L)    Total Bilirubin 0.5  0.3 - 1.2 (mg/dL)    GFR calc non Af Amer >90  >90 (mL/min)    GFR calc Af Amer >90  >90 (mL/min)   CBC     Status: Abnormal   Collection Time   04/18/11  6:23 AM      Component Value Range Comment   WBC 12.9 (*) 4.0 - 10.5 (K/uL)    RBC 4.63  4.22 - 5.81 (MIL/uL)    Hemoglobin 13.7  13.0 - 17.0 (g/dL)    HCT 29.5  28.4 - 13.2 (%)    MCV 87.5  78.0 - 100.0 (fL)    MCH 29.6  26.0 - 34.0 (pg)    MCHC 33.8  30.0 - 36.0 (g/dL)    RDW 44.0  10.2 - 72.5 (%)    Platelets 226  150 - 400 (K/uL)   HIV ANTIBODY  Status: Normal   Collection Time   04/18/11  6:23 AM      Component Value Range Comment   HIV NON REACTIVE  NON REACTIVE    RPR     Status: Normal   Collection Time   04/18/11  6:23 AM      Component Value Range Comment   RPR NON REACTIVE  NON REACTIVE    LIPID PANEL     Status: Normal   Collection Time   04/18/11  6:23 AM      Component Value Range Comment   Cholesterol 148  0 - 200 (mg/dL)    Triglycerides 83  <409 (mg/dL)    HDL 40  >81 (mg/dL)    Total CHOL/HDL Ratio 3.7      VLDL 17  0 - 40 (mg/dL)    LDL Cholesterol 91  0 - 99 (mg/dL)   GLUCOSE, CAPILLARY     Status: Abnormal   Collection Time   04/18/11  7:35 AM      Component Value Range Comment   Glucose-Capillary 242 (*) 70 - 99 (mg/dL)   SEDIMENTATION RATE     Status: Abnormal   Collection Time   04/18/11 11:26 AM      Component Value Range Comment   Sed Rate 50 (*) 0 - 16 (mm/hr)   GLUCOSE, CAPILLARY     Status: Abnormal   Collection Time   04/18/11 11:35 AM       Component Value Range Comment   Glucose-Capillary 232 (*) 70 - 99 (mg/dL)    Comment 1 Documented in Chart      Comment 2 Notify RN     GLUCOSE, CAPILLARY     Status: Abnormal   Collection Time   04/18/11  4:41 PM      Component Value Range Comment   Glucose-Capillary 219 (*) 70 - 99 (mg/dL)    Comment 1 Documented in Chart      Comment 2 Notify RN       Dg Foot Complete Right  04/17/2011  *RADIOLOGY REPORT*  Clinical Data: Soft tissue wound under right foot.  RIGHT FOOT COMPLETE - 3+ VIEW  Comparison: None.  Findings: Soft tissue defect under the plantar are surface of the forefoot is noted.  No destructive bone lesion.  No evidence of aggressive periosteal reaction.  There is a fracture line with evidence of healing at the base of the distal phalanx of the great toe.  Bony framework is otherwise intact.  Degenerative changes are noted.  There is marked deformity of the distal fibula worrisome for post-traumatic changes.  IMPRESSION: No definite evidence of osteomyelitis.  Healing fracture involving the distal phalanx of the great toe.  Post-traumatic changes involving the distal fibula.  Original Report Authenticated By: Donavan Burnet, M.D.    ROS Blood pressure 138/87, pulse 77, temperature 98.2 F (36.8 C), temperature source Oral, resp. rate 18, height 6\' 4"  (1.93 m), weight 92.942 kg (204 lb 14.4 oz), SpO2 98.00%. Physical Exam on physical exam patient is a good dorsalis pedis pulse he has is swelling with a necrotic ulcer beneath the second metatarsal head. With probing with a Q-tip this probes to bone includes both the second and third metatarsal head. Review of the radiograph shows no definite osteomyelitis but with the open wound to the metatarsal head patient most likely has a chronic osteomyelitis.  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 Plan we will obtain an MRI scan with  contrast I am concerned that he may have  involvement of the third metatarsal which would require a different surgical intervention. Patient will at least require a second ray amputation of the right foot. Patient does have good palpable pulses in review of his ABIs shows adequate circulation. Patient's current on Zosyn and I will follow up after the MRI is obtained.  Naraly Fritcher V 04/18/2011, 6:15 PM

## 2011-04-18 NOTE — Progress Notes (Signed)
*  PRELIMINARY RESULTS*  ABIs has been performed.  Pedal waveforms and ABIs are within normal limits.  Right ABI 1.08, Left ABI 1.11 with triphasic pedal waveforms X 4.  Steven [name] 04/18/2011, 2:34 PM

## 2011-04-18 NOTE — Progress Notes (Signed)
PGY-1 Progress Note  S: No acute events after admission. Pt states pain is the same  O.  Filed Vitals:   04/18/11 0530  BP: 126/80  Pulse: 88  Temp: 99.9 F (37.7 C)  Resp: 16   CBC    Component Value Date/Time   WBC 12.9* 04/18/2011 0623   RBC 4.63 04/18/2011 0623   HGB 13.7 04/18/2011 0623   HCT 40.5 04/18/2011 0623   PLT 226 04/18/2011 0623   MCV 87.5 04/18/2011 0623   MCH 29.6 04/18/2011 0623   MCHC 33.8 04/18/2011 0623   RDW 13.1 04/18/2011 0623   LYMPHSABS 2.1 04/17/2011 2226   MONOABS 1.7* 04/17/2011 2226   EOSABS 0.0 04/17/2011 2226   BASOSABS 0.0 04/17/2011 2226    BMET    Component Value Date/Time   NA 133* 04/18/2011 0623   K 3.2* 04/18/2011 0623   CL 97 04/18/2011 0623   CO2 25 04/18/2011 0623   GLUCOSE 215* 04/18/2011 0623   BUN 7 04/18/2011 0623   CREATININE 0.53 04/18/2011 0623   CREATININE 0.74 12/22/2010 1107   CALCIUM 9.6 04/18/2011 0623   GFRNONAA >90 04/18/2011 0623   GFRAA >90 04/18/2011 0623    CBG (last 3)   Basename 04/18/11 0735 04/18/11 0252 04/17/11 2140  GLUCAP 242* 344* 208*    On exam, he is lying in bed in NAD. Lungs CTAB, Heart RRR. Abd soft. Right lower extremity unchanged. Dressing is not in place. Wound has malodorous purluent discharge, tender to palpation of entire foot, sensory intact. Right lower leg with swelling, no open venous stasis wounds. Neuro grossly intact.  A/P: 50 yo male p/w infected diabetic ulcer of right foot - Wound care consult - F/U remainder of labs (HIV, RPR, drug screen, EtOh, ESR) - K+ was low, will replete PO x1 now - KVO fluids after banana bag is complete - Continue Vanc/Zosyn - Continue SSI - Follow B/P (restart Lisinopril, if needed) - Continue CIWA - Nicotine patch, if requested - Ppx Heparin - Carb modified diet - Dispo pending clinical improvement  Will discuss patient with primary team and attending Dr. Leveda Anna.  Dee Paden M. Lebron Nauert, M.D. 9:51 AM

## 2011-04-18 NOTE — Progress Notes (Signed)
ANTIBIOTIC CONSULT NOTE - INITIAL  Pharmacy Consult for vancomycin,zosyn Indication: diabetic foot ulcer  No Known Allergies  Patient Measurements:   Adjusted Body Weight:   Vital Signs: Temp: 99.7 F (37.6 C) (11/05 2329) Temp src: Oral (11/05 2329) BP: 155/73 mmHg (11/05 2329) Pulse Rate: 67  (11/05 2329) Intake/Output from previous day:   Intake/Output from this shift:    Labs:  Basename 04/18/11 0159 04/17/11 2232 04/17/11 2226  WBC 14.8* -- 15.7*  HGB 14.3 -- 15.4  PLT 211 -- 215  LABCREA -- -- --  CREATININE -- 0.53 --   The CrCl is unknown because both a height and weight (above a minimum accepted value) are required for this calculation. No results found for this basename: VANCOTROUGH:2,VANCOPEAK:2,VANCORANDOM:2,GENTTROUGH:2,GENTPEAK:2,GENTRANDOM:2,TOBRATROUGH:2,TOBRAPEAK:2,TOBRARND:2,AMIKACINPEAK:2,AMIKACINTROU:2,AMIKACIN:2, in the last 72 hours   Microbiology: No results found for this or any previous visit (from the past 720 hour(s)).  Medical History: Past Medical History  Diagnosis Date  . DM (diabetes mellitus)   . HTN (hypertension)   . History of DVT (deep vein thrombosis)   . Personal history of diabetic foot ulcer     Medications:   Assessment: 50 yo to start vanc and zosyn for diabetic foot ulcer  Goal of Therapy:  Vancomycin trough level 10-15 mcg/ml  Plan:  Follow up culture results  Jordana Dugue Poteet 04/18/2011,2:27 AM

## 2011-04-18 NOTE — Progress Notes (Signed)
Inpatient Diabetes Program Recommendations  AACE/ADA: New Consensus Statement on Inpatient Glycemic Control (2009)  Target Ranges:  Prepandial:   less than 140 mg/dL      Peak postprandial:   less than 180 mg/dL (1-2 hours)      Critically ill patients:  140 - 180 mg/dL   Reason for Visit: hyperglycemia, glucose greater than 200 mg/dL  Inpatient Diabetes Program Recommendations Insulin - Basal: lPlease add Lantus 10-15 units daily or at HS  Note: HgBA1C is high at 9.8.  Pt would benefit from Lantus at home as well if willing to use.

## 2011-04-18 NOTE — Consults (Signed)
WOC consult Note Reason for Consult:requested to evaluate patient for wound care to LLE.    Wound type: assessment of patient, has full thickness DM ulcer of LLE plantar surface between 1st and 2nd met head.    Measurement: 1.0cm x 1.5cm x 2.0cm   Wound bed: yellow wound base with macerated tissue present, tunnels 2cm towards 1 o'clock , pt does have pain with probing of this area   Drainage (amount, consistency, odor): yellow thick drainage with some note odor  Peri wound: hyperkeratotic periwound and noted maceration  Dressing procedure/placement/frequency: with noted drainage will recommend alginate dressing to be packed in to wound daily cover with dry dressing until ortho consult can be obtained for eval. For debridement of area.    Pt will benefit from wide debridement of area with wound care and offloading of area for wound healing to occur.  Would suggest ortho consult for debridement and fitting of offloading shoe/orthotic.   Re consult if needed, will not follow at this time. Thanks  Latice Waitman [employer], CWOCN [phone])

## 2011-04-18 NOTE — H&P (Signed)
PGY-1 History and Physical Exam Family Medicine Teaching Service Emanuelle Bastos M. Merril Isakson, MD Service Pager: [phone]  Steven [name] is an 50 y.o. male.   Chief Complaint: foot pain HPI: Pt is a 50 yo M with PMH of DM, chronic foot ulcer, venous insufficiency, HTN and alcohol abuse who presented to ED for worsening pain of chronic foot ulcer of right foot. Patient is a poor historian but he states this ulcer has been there for over one year. Starting within the last few days, the ulcer became acutely more painful and began having drainage. The drainage is purulent and malodorous. He states he has had increased swelling of the right leg. He is endorsing pain to the entire bottom of his foot as well as some pain on top of his foot. He has recently seen Dr. Ashley Royalty at the [facility] who strongly recommended a Podiatry visit for this. Pt states he has seen wound care in the past but he has been in the process of moving and has not seen anyone in a long time. Pt did see wound care today (he states it was at an urgent care center) who sent him to the ED for further evaluation. He denies fever, chills, headache, N/V, decreased appetite, changes in bowel regimen, dysuria. He endorses foot pain, difficulty walking and right leg swelling.  Past Medical History  Diagnosis Date  . DM (diabetes mellitus)   . HTN (hypertension)   . History of DVT (deep vein thrombosis)   . Personal history of diabetic foot ulcer     History reviewed. No pertinent past surgical history.  Family History  Problem Relation Age of Onset  . Diabetes Mother   . Stroke Brother    Social History:  reports that he has been smoking Cigarettes.  He has a 15 pack-year smoking history. He has never used smokeless tobacco. He reports that he drinks about 2.5 - 3 ounces of alcohol per week. He reports that he uses illicit drugs (Marijuana) about twice per week. Lives alone in an apartment in [place]. He is not employed; he is on  disability.  Allergies: No Known Allergies  Medications Prior to Admission  Medication Dose Route Frequency Provider Last Rate Last Dose  . 0.9 %  sodium chloride infusion   Intravenous Once Rodena Medin, PA 100 mL/hr at 04/17/11 2238    . heparin injection 5,000 Units  5,000 Units Subcutaneous Q8H Bennett Ram, MD      . ibuprofen (ADVIL,MOTRIN) tablet 400 mg  400 mg Oral Q6H PRN Kischa Altice, MD      . insulin aspart (novoLOG) injection 0-9 Units  0-9 Units Subcutaneous TID WC Chenita Ruda, MD      . morphine 2 MG/ML injection 2 mg  2 mg Intravenous Q2H PRN Angline Schweigert, MD      . morphine 4 MG/ML injection 4 mg  4 mg Intravenous Once Rodena Medin, PA   4 mg at 04/17/11 2215  . piperacillin-tazobactam (ZOSYN) IVPB 3.375 g  3.375 g Intravenous Once Rodena Medin, PA   3.375 g at 04/17/11 2243  . sodium chloride 0.9 % 1,000 mL with thiamine 100 mg, folic acid 1 mg, multivitamins adult 10 mL infusion   Intravenous Once Bren Borys, MD      . vancomycin (VANCOCIN) IVPB 1000 mg/200 mL premix  1,000 mg Intravenous Once Rodena Medin, PA   1,000 mg at 04/17/11 2215   Medications Prior to Admission  Medication Sig Dispense  Refill  . lisinopril (PRINIVIL,ZESTRIL) 10 MG tablet Take 10 mg by mouth daily.        . metFORMIN (GLUCOPHAGE) 1000 MG tablet Take 1,000 mg by mouth 2 (two) times daily with a meal.          Results for orders placed during the hospital encounter of 04/17/11 (from the past 48 hour(s))  GLUCOSE, CAPILLARY     Status: Abnormal   Collection Time   04/17/11  9:40 PM      Component Value Range Comment   Glucose-Capillary 208 (*) 70 - 99 (mg/dL)   CBC     Status: Abnormal   Collection Time   04/17/11 10:26 PM      Component Value Range Comment   WBC 15.7 (*) 4.0 - 10.5 (K/uL)    RBC 5.04  4.22 - 5.81 (MIL/uL)    Hemoglobin 15.4  13.0 - 17.0 (g/dL)    HCT 60.4  54.0 - 98.1 (%)    MCV 87.1  78.0 - 100.0 (fL)    MCH 30.6  26.0 - 34.0 (pg)    MCHC 35.1   30.0 - 36.0 (g/dL)    RDW 19.1  47.8 - 29.5 (%)    Platelets 215  150 - 400 (K/uL)   DIFFERENTIAL     Status: Abnormal   Collection Time   04/17/11 10:26 PM      Component Value Range Comment   Neutrophils Relative 75  43 - 77 (%)    Neutro Abs 11.8 (*) 1.7 - 7.7 (K/uL)    Lymphocytes Relative 14  12 - 46 (%)    Lymphs Abs 2.1  0.7 - 4.0 (K/uL)    Monocytes Relative 11  3 - 12 (%)    Monocytes Absolute 1.7 (*) 0.1 - 1.0 (K/uL)    Eosinophils Relative 0  0 - 5 (%)    Eosinophils Absolute 0.0  0.0 - 0.7 (K/uL)    Basophils Relative 0  0 - 1 (%)    Basophils Absolute 0.0  0.0 - 0.1 (K/uL)   BASIC METABOLIC PANEL     Status: Abnormal   Collection Time   04/17/11 10:32 PM      Component Value Range Comment   Sodium 132 (*) 135 - 145 (mEq/L)    Potassium 3.7  3.5 - 5.1 (mEq/L)    Chloride 95 (*) 96 - 112 (mEq/L)    CO2 24  19 - 32 (mEq/L)    Glucose, Bld 196 (*) 70 - 99 (mg/dL)    BUN 7  6 - 23 (mg/dL)    Creatinine, Ser 6.21  0.50 - 1.35 (mg/dL)    Calcium 30.8  8.4 - 10.5 (mg/dL)    GFR calc non Af Amer >90  >90 (mL/min)    GFR calc Af Amer >90  >90 (mL/min)   CBC     Status: Abnormal   Collection Time   04/18/11  1:59 AM      Component Value Range Comment   WBC 14.8 (*) 4.0 - 10.5 (K/uL)    RBC 4.69  4.22 - 5.81 (MIL/uL)    Hemoglobin 14.3  13.0 - 17.0 (g/dL)    HCT 65.7  84.6 - 96.2 (%)    MCV 87.4  78.0 - 100.0 (fL)    MCH 30.5  26.0 - 34.0 (pg)    MCHC 34.9  30.0 - 36.0 (g/dL)    RDW 95.2  84.1 - 32.4 (%)    Platelets 211  150 -  400 (K/uL)    Dg Foot Complete Right  04/17/2011  *RADIOLOGY REPORT*  Clinical Data: Soft tissue wound under right foot.  RIGHT FOOT COMPLETE - 3+ VIEW  Comparison: None.  Findings: Soft tissue defect under the plantar are surface of the forefoot is noted.  No destructive bone lesion.  No evidence of aggressive periosteal reaction.  There is a fracture line with evidence of healing at the base of the distal phalanx of the great toe.  Bony framework  is otherwise intact.  Degenerative changes are noted.  There is marked deformity of the distal fibula worrisome for post-traumatic changes.  IMPRESSION: No definite evidence of osteomyelitis.  Healing fracture involving the distal phalanx of the great toe.  Post-traumatic changes involving the distal fibula.  Original Report Authenticated By: Donavan Burnet, M.D.    Review of Systems  All other systems reviewed and are negative.  *Please see HPI for ROS  Blood pressure 155/73, pulse 67, temperature 99.7 F (37.6 C), temperature source Oral, resp. rate 18, SpO2 99.00%. Physical Exam  Vitals reviewed. Constitutional: He is oriented to person, place, and time. He appears well-developed and well-nourished. He is cooperative. He appears distressed (Mildly distressed).  HENT:  Head: Normocephalic and atraumatic.  Nose: Nose normal.  Mouth/Throat: Mucous membranes are not dry. Posterior oropharyngeal erythema present. No oropharyngeal exudate or posterior oropharyngeal edema.  Eyes: Lids are normal. Scleral icterus is present.  Neck: Normal range of motion. Neck supple. No thyromegaly present.  Cardiovascular: Normal rate and normal heart sounds.  Exam reveals no gallop and no friction rub.   No murmur heard.      Irregular rhythm  Respiratory: Effort normal and breath sounds normal. He has no wheezes. He has no rales.  GI: Soft. Bowel sounds are normal. He exhibits no distension and no mass. There is no tenderness.  Musculoskeletal:       Ulcerated callous of right foot proximal to 3rd toe. 2x2cm, thickened skin with central ulceration. Productive of purulent fluid. On probing, does not go to bone. No bleeding. Sensation of feet bilaterally intact and equal. Pulses of left foot palpable 2+. Pulses of right foot, not palpable but were located with doppler; monophasic. Patient has difficulty ambulating due to pain.  Neurological: He is alert and oriented to person, place, and time. No cranial nerve  deficit or sensory deficit.  Skin: Skin is warm and dry. Lesion and rash noted.       Multiple hyperpigmented macular lesions of torso, back and arms with excoriations. Pt had faint rash of palms.     Assessment/Plan 50 yo M with PMH of DM, HTN, chronic foot ulcer, venous stasis presenting with infected foot wound. 1. Admit to FPTS. Attending Dr. Sheffield Slider. Floor bed with fall precautions. 2. DM foot ulcer: Increased pain and discharge from wound within the last few days. Does appear to be an acute infection of chronic wound. XRay does not indicate osteomyelitis at this time. The ucler does not go to the bone, and no indication for surgical consult at this time. Will treat with Vanc and Zosyn for wide coverage of a foot ulcer in a diabetic. (Pharmacy to dose these antibiotics.) Will get wound culture and blood cultures. Wound care to see patient tomorrow for further recommendations on dressing the foot. Continue to monitor for fevers, increased pain, swelling or increased discharge. Will give Morphine 2mg  IV prn for pain as well as IBU prn.  3. Diabetes: Patient on Metformin as an outpatient. His  recent A1C was 9.8. Will do CBG checks qAc and Hs and give SSI as needed. Will also consider diabetes education while patient is in the hospital. 4. Hypertension: BP stable at this time. Continue home dose of Lisinopril at this time. Will continue to monitor blood pressure.  5. Alcohol use: Pt states he drinks every other day (beer and liquor.) Will start CIWA protocol and monitor for signs of withdrawal 6. Chronic Venous statis: Pt's right leg is more swollen than the left. He has signs of previous ulcerations. He will keep his leg elevated while in the hospital which should help with swelling, but will likely need compression hose as an outpatient. 7. HLD: Checking fasting lipid panel in the am. On no medications as an outpatient at this time.  8. Current everyday smoker: Pt smokes 1/2 ppd for the last 30 years.  Will offer Nicotine patch if he asks for it, as well as smoking cessation education. 9. Illicit drug use: Pt endorses using Marijuana. He has been positive for cocaine in the past. Will check UDS with EtOH level, as well as HIV and RPR since we are unsure of an IV drug use or other risky behaviors in the past. 10. FEN/GI: Carb modified diet. Banana bag x1 tonight, will KVO fluids in the morning. 11. PPx: Heparin 5000U Sq TID 12. Dispo: Pending clinical improvement.  Steven [name] 04/18/2011, 2:20 AM   R2 Addendum History and Physical   S: 50 yo M with known history of diabetes presents after being evaluated at an urgenct care for evalautaion and treatment of chronic diabeteic foot ulcer.  Please see Intern note for details of HPI, Allergies, Meds, PMHx, Soc Hx, ROS.      O:  VS: T 100.7  99.7, BP 155/73, HR 67, RR 18, O2sat 99 % on RA GEN: alert, thin male.  No distress.  HEENT: NCAT, Pupils pinpoint but round, scleral icterus . lower palpebral xanthomas.  Dry mucus membranes.  No thrush. No cervical lymphadenopathy.  CV: S1S2, PVC, no MRG Resp: nml WOB, CTA b/l ABD: NABSs, soft, NT, mildly distended  SKIN: xerotic, diffuse areas of excoriations.  RLE  Skin atrophic, lacks hairs, multiple small healed shallow ulcers consistent with arterial insufficiency. R foot swollen. Tender to palpation on mid dorsum and plantar surface immediately surrounding the wound. Wound is about 1 cm proximal to 3rd distal phalanx,  5x5 mm with surrounding  thick callus about 2 mm of circumferential undermining below calus. Depth is to subcutaneous tissue does not extend down to bone. No surrounding erythema or streaking. Right DP and PT pulses are non-palpable but are appreciated with doppler and are monophasic. L DP and PT pulses are 2+. EXT: R 5th toes is medial dislocated, non tender.  NEURO: A &O x 3, 5/5 strength bilateral upper and lower extremities.   A/P: 50 yo M with diabetic foot ulcer and evidence of  arterial insufficiency.  1. DM foot ulcer: reviewed complete foot x-ray. There is no evidence of osteomyelitis on x-ray. Given the chronic nature of patient's ulcer one would expect lytic changes on x-rays if there was bony involvement. Plan Td IV vein and Zosyn per pharmacy obtain blood and wound cultures consult wound care for the private. Currently patient does not be criteria for sepsis. Will discuss obtaining MRI in the morning with attending physician. Currently, I do not feel that an MRI is needed.   2. Hyperglycemia: Patient is a nondiabetic who is poorly controlled on oral therapy. Plan  to monitor CBGs q. a.c. and each bedtime and administer sliding scale insulin.  3. Hypertension: Hypertension likely multifactorial secondary to baseline hypertension and pain. Will continue patient's home at the hypertensive regimen as well as pain control with IV morphine and ibuprofen.  4. EtOH abuse: Patient reports drinking 3-4 beers every other day. Suspect that he drinks much more. Plan to obtain blood alcohol level. Place patient on CIWA.  protocol.  5. Hyponatremia: This is mildly hyponatremic and hypochloremic possibly secondary to poor by mouth intake and excessive alcohol intake. Plan to replete with IV fluid hydration recheck sodium and chloride in the morning.  6. History of illicit drug use: Patient has screen positive for cocaine in the past. We'll obtain urine drug screen this admission. Patient denies IV drug abuse. Given his risk factors will test for HIV.  7. DVT prophylaxis: Heparin 5000 units subcutaneous 3 times a day.

## 2011-04-18 NOTE — H&P (Signed)
I interviewed and examined this patient and discussed the care plan with Dr. Armen Pickup and [facility] and agree with assessment and plan as documented in their admission note for today. I found decreased sensation on filament testing in his distal great toes, but 2+ pedal pulses bilaterally except for 1+ left posterior tibial pulse. He is edentulous, has muddy sclerae, and mild nasal congestion. Otherwise I agree with the physical examination.    Marycatherine Maniscalco A. Sheffield Slider, MD Family Medicine Teaching Service Attending  04/18/2011 9:58 AM

## 2011-04-19 ENCOUNTER — Inpatient Hospital Stay (HOSPITAL_COMMUNITY): Payer: Medicaid Other

## 2011-04-19 LAB — BASIC METABOLIC PANEL
CO2: 26 mEq/L (ref 19–32)
Chloride: 97 mEq/L (ref 96–112)
Glucose, Bld: 198 mg/dL — ABNORMAL HIGH (ref 70–99)
Potassium: 3.7 mEq/L (ref 3.5–5.1)
Sodium: 133 mEq/L — ABNORMAL LOW (ref 135–145)

## 2011-04-19 LAB — CBC
Hemoglobin: 13.7 g/dL (ref 13.0–17.0)
MCH: 29.8 pg (ref 26.0–34.0)
RBC: 4.6 MIL/uL (ref 4.22–5.81)
WBC: 12.9 10*3/uL — ABNORMAL HIGH (ref 4.0–10.5)

## 2011-04-19 LAB — GLUCOSE, CAPILLARY
Glucose-Capillary: 112 mg/dL — ABNORMAL HIGH (ref 70–99)
Glucose-Capillary: 129 mg/dL — ABNORMAL HIGH (ref 70–99)

## 2011-04-19 LAB — HEMOGLOBIN A1C
Hgb A1c MFr Bld: 9.4 % — ABNORMAL HIGH (ref <5.7)
Mean Plasma Glucose: 223 mg/dL — ABNORMAL HIGH (ref <117)

## 2011-04-19 MED ORDER — INSULIN ASPART 100 UNIT/ML ~~LOC~~ SOLN
0.0000 [IU] | Freq: Three times a day (TID) | SUBCUTANEOUS | Status: DC
  Administered 2011-04-19 – 2011-04-20 (×3): 8 [IU] via SUBCUTANEOUS
  Administered 2011-04-20: 5 [IU] via SUBCUTANEOUS

## 2011-04-19 MED ORDER — GADOBENATE DIMEGLUMINE 529 MG/ML IV SOLN
20.0000 mL | Freq: Once | INTRAVENOUS | Status: AC
  Administered 2011-04-19: 20 mL via INTRAVENOUS

## 2011-04-19 MED ORDER — POLYETHYLENE GLYCOL 3350 17 G PO PACK
17.0000 g | PACK | Freq: Every day | ORAL | Status: DC
  Administered 2011-04-19 – 2011-04-22 (×4): 17 g via ORAL
  Filled 2011-04-19 (×4): qty 1

## 2011-04-19 NOTE — Progress Notes (Signed)
PGY-1 Progress Note  Radiologist concerned about MRI of foot. Contacted Dr. Lajoyce Corners about the results. Will proceed with surgery. Spoke with patient and his sister, Steven [name], about the plan. They do not have any questions at this time.   Amber M. Hairford, M.D.

## 2011-04-19 NOTE — Progress Notes (Signed)
Inpatient Diabetes Program Recommendations  AACE/ADA: New Consensus Statement on Inpatient Glycemic Control (2009)  Target Ranges:  Prepandial:   less than 140 mg/dL      Peak postprandial:   less than 180 mg/dL (1-2 hours)      Critically ill patients:  140 - 180 mg/dL   Reason for Visit: Glucose levels continue to be high  Inpatient Diabetes Program Recommendations Insulin - Basal: Fasting glucose levels still high and needs Lantus at least 10-15 uinits daily or HS while here.  Due to high HgBA1C, pt oculd benefit from Lantus at home as well.  Note: Another potential while here is to increase correction scale to moderate and add HS scale.

## 2011-04-19 NOTE — Progress Notes (Signed)
Seen and examined.  Appreciate ortho help.  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  Will likely need surgical intervention

## 2011-04-19 NOTE — Progress Notes (Signed)
Clinical Social Worker completed psychosocial assessment which can be found in the shadow chart. Theresia Bough, MSW, Theresia Majors [phone]

## 2011-04-19 NOTE — Progress Notes (Signed)
PGY-1 Daily Progress Note Family Medicine Teaching Service Steven [name] M. Dalis Beers, MD Service Pager: [phone]  Subjective: No acute events overnight. Patient has no complaints this morning. Patient was seen by ortho yesterday who wants an MRI, and then they will make a plan for surgery. Patient is aware of this and states understanding  Objective: Vital signs in last 24 hours: Temp:  [98.2 F (36.8 C)-98.9 F (37.2 C)] 98.9 F (37.2 C) (11/07 0702) Pulse Rate:  [70-81] 70  (11/07 0702) Resp:  [18-19] 19  (11/07 0702) BP: (120-139)/(76-87) 120/76 mmHg (11/07 0702) SpO2:  [93 %-99 %] 93 % (11/07 0702) Weight change:  Last BM Date: 04/16/11  Intake/Output from previous day: 11/06 0701 - 11/07 0700 In: 600 [P.O.:600] Out: 3400 [Urine:3400] Intake/Output this shift:    General appearance: alert, cooperative and no distress Head: Normocephalic, without obvious abnormality, atraumatic Resp: clear to auscultation bilaterally Cardio: regular rate and rhythm, S1, S2 normal, no murmur, click, rub or gallop GI: soft, non-tender; bowel sounds normal; no masses,  no organomegaly Extremities: right lower extremity with warmth from mid thigh. Large dressing in place on right foot, with moderate amount of drainage noted on bandage, with a foul odor. Moves all extremities. Sensation intact. Skin: Hyperpigmented macular lesions of arms and chest with excoriations. Hyperpigmentation of right lower extremity with swelling. No open wounds of leg. Neurologic: Grossly normal  Lab Results:  [facility] 04/19/11 0605 04/18/11 0623  WBC 12.9* 12.9*  HGB 13.7 13.7  HCT 40.1 40.5  PLT 228 226   BMET  Basename 04/19/11 0605 04/18/11 0623  NA 133* 133*  K 3.7 3.2*  CL 97 97  CO2 26 25  GLUCOSE 198* 215*  BUN 7 7  CREATININE 0.54 0.53  CALCIUM 9.9 9.6   CBG (last 3)   Basename 04/19/11 0621 04/19/11 0619 04/18/11 2159  GLUCAP 193* 112* 218*    Studies/Results: Dg Foot Complete  Right  04/17/2011  *RADIOLOGY REPORT*  Clinical Data: Soft tissue wound under right foot.  RIGHT FOOT COMPLETE - 3+ VIEW  Comparison: None.  Findings: Soft tissue defect under the plantar are surface of the forefoot is noted.  No destructive bone lesion.  No evidence of aggressive periosteal reaction.  There is a fracture line with evidence of healing at the base of the distal phalanx of the great toe.  Bony framework is otherwise intact.  Degenerative changes are noted.  There is marked deformity of the distal fibula worrisome for post-traumatic changes.  IMPRESSION: No definite evidence of osteomyelitis.  Healing fracture involving the distal phalanx of the great toe.  Post-traumatic changes involving the distal fibula.  Original Report Authenticated By: Donavan Burnet, M.D.   04/18/2011 Lower extremity Arterial Doppler Bilaterally Summary: Normal ABI's and pedal waveforms at rest.  Medications:  I have reviewed the patient's current medications. Scheduled:   . heparin  5,000 Units Subcutaneous Q8H  . insulin aspart  0-9 Units Subcutaneous TID WC  . lisinopril  10 mg Oral Daily  . piperacillin-tazobactam (ZOSYN)  IV  3.375 g Intravenous Q8H  . potassium chloride  40 mEq Oral Once  . vancomycin  1,000 mg Intravenous Q8H   Continuous:  AVW:[id number], morphine Anti-infectives     Start     Dose/Rate Route Frequency Ordered Stop   04/17/11 2200   vancomycin (VANCOCIN) IVPB 1000 mg/200 mL premix        1,000 mg 200 mL/hr over 60 Minutes Intravenous  Once 04/17/11 2200 04/17/11 2315  04/17/11 2200   piperacillin-tazobactam (ZOSYN) IVPB 3.375 g        3.375 g 12.5 mL/hr over 240 Minutes Intravenous  Once 04/17/11 2200 04/17/11 2313          Assessment/Plan: 50 yo male with PMH of uncontrolled DM, chronic foot ulcer, HTN, tobacco use and alcohol use presenting with 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 1. Foot wound- Chronic foot ulcer, now infected. On admission, Xray did not  show osteo. Wound culture final results pending. WBC stable at 12.9. Arterial dopplers yesterday were wnl. We consulted wound care as well as Orthopedic Surgery yesterday. We greatly appreciate their consult, especially Ortho's recommendations for further evaluation and treatment. We will get MRI today to evaluate the extent of infection and await Ortho's recs on how they would like to proceed with surgical treatment. For now, continue Vanc and Zosyn, as well as Morphine for pain control. PT/OT consults to help with ambulation. Continue to monitor patient and dress wounds, per wound consult.  2. DM- Uncontrolled. On Metformin as an outpatient. Admitted with CBG's around 300 and started on SSI. Pt required 11U in 24 hours. CBG now in 100's. Continue to monitor. Continue SSI. 3. HTN- On home dose of Lisinopril 10mg . Blood pressure stable. Continue to monitor. 4. Tobacco use- 1/2 ppd smoker. Pharmacy student discussed smoking with patient today. He did not seem aware that smoking made his circulation worse, and had numerous effects on his health. He does not state any desire to quit. He was offered Nicotine patches which he has refused. 5. Alcohol use- On CIWA protocol, but has not had any s/sx of withdrawal since admission 6. PPx- Heparin SQ  7. FEN/GI- Carb modified diet. IVF to [facility].  8. Dispo- Pending decision of ortho today, as well as overall clinical improvement.   Code: Full PCP: Ashley Royalty  LOS: 2 days   Lou Irigoyen 04/19/2011, 9:21 AM

## 2011-04-20 LAB — WOUND CULTURE

## 2011-04-20 LAB — BASIC METABOLIC PANEL
CO2: 25 mEq/L (ref 19–32)
Chloride: 94 mEq/L — ABNORMAL LOW (ref 96–112)
Creatinine, Ser: 0.54 mg/dL (ref 0.50–1.35)

## 2011-04-20 LAB — VANCOMYCIN, TROUGH: Vancomycin Tr: 10.4 ug/mL (ref 10.0–20.0)

## 2011-04-20 LAB — CBC
MCV: 86.5 fL (ref 78.0–100.0)
Platelets: 251 10*3/uL (ref 150–400)
RBC: 4.59 MIL/uL (ref 4.22–5.81)
WBC: 12.3 10*3/uL — ABNORMAL HIGH (ref 4.0–10.5)

## 2011-04-20 LAB — GLUCOSE, CAPILLARY
Glucose-Capillary: 252 mg/dL — ABNORMAL HIGH (ref 70–99)
Glucose-Capillary: 286 mg/dL — ABNORMAL HIGH (ref 70–99)

## 2011-04-20 MED ORDER — CHLORHEXIDINE GLUCONATE 4 % EX LIQD
60.0000 mL | Freq: Once | CUTANEOUS | Status: AC
  Administered 2011-04-21: 4 via TOPICAL
  Filled 2011-04-20: qty 118

## 2011-04-20 MED ORDER — VANCOMYCIN HCL 500 MG IV SOLR
500.0000 mg | INTRAVENOUS | Status: AC
  Filled 2011-04-20: qty 500

## 2011-04-20 MED ORDER — CEFAZOLIN SODIUM 1-5 GM-% IV SOLN
1.0000 g | INTRAVENOUS | Status: DC
  Filled 2011-04-20: qty 50

## 2011-04-20 MED ORDER — CEFAZOLIN SODIUM 1-5 GM-% IV SOLN
1.0000 g | INTRAVENOUS | Status: AC
  Administered 2011-04-21: 1 g via INTRAVENOUS
  Filled 2011-04-20: qty 50

## 2011-04-20 MED ORDER — CHLORHEXIDINE GLUCONATE 4 % EX LIQD
60.0000 mL | Freq: Once | CUTANEOUS | Status: DC
  Filled 2011-04-20: qty 118

## 2011-04-20 MED ORDER — VANCOMYCIN HCL 1000 MG IV SOLR
1500.0000 mg | Freq: Three times a day (TID) | INTRAVENOUS | Status: DC
  Administered 2011-04-20 – 2011-04-22 (×5): 1500 mg via INTRAVENOUS
  Filled 2011-04-20 (×8): qty 1500

## 2011-04-20 NOTE — Progress Notes (Signed)
PGY-1 Daily Progress Note Family Medicine Teaching Service Yutaka Holberg M. Charidy Cappelletti, MD Service Pager: [phone]  Subjective: Patient reporting pain in his right foot. Otherwise, no acute events. Pt is afebrile.  Objective: Vital signs in last 24 hours: Temp:  [98.7 F (37.1 C)-99.1 F (37.3 C)] 98.7 F (37.1 C) (11/08 0640) Pulse Rate:  [64-76] 64  (11/08 0640) Resp:  [16-20] 16  (11/08 0640) BP: (122-169)/(76-94) 127/81 mmHg (11/08 0956) SpO2:  [94 %-98 %] 95 % (11/08 0640) Weight change:  Last BM Date: 04/16/11  Intake/Output from previous day: 11/07 0701 - 11/08 0700 In: 1340 [P.O.:960; I.V.:80; IV Piggyback:300] Out: 2775 [Urine:2775] Intake/Output this shift: Total I/O In: 240 [P.O.:240] Out: 300 [Urine:300]  General appearance: Awake, alert, cooperative and no distress Head: Normocephalic, without obvious abnormality, atraumatic Resp: Clear to auscultation bilaterally, good effort Cardio: regular rate and rhythm, S1, S2 normal, no murmur, click, rub or gallop GI: soft, non-tender; bowel sounds normal Extremities: Right lower extremity with warmth from mid thigh to foot. Dressing in place on right foot; foul odor noted. Moves all extremities. Sensation intact. Skin: Hyperpigmented macular lesions of arms and chest with excoriations. Hyperpigmentation of right lower extremity with swelling. No open wounds of leg. Neurologic: Grossly normal  Lab Results:  Basename 04/20/11 0654 04/19/11 0605  WBC 12.3* 12.9*  HGB 13.6 13.7  HCT 39.7 40.1  PLT 251 228   BMET  Basename 04/20/11 0654 04/19/11 0605  NA 131* 133*  K 3.8 3.7  CL 94* 97  CO2 25 26  GLUCOSE 212* 198*  BUN 7 7  CREATININE 0.54 0.54  CALCIUM 9.9 9.9   CBG (last 3)   Basename 04/20/11 1112 04/20/11 0721 04/19/11 2127  GLUCAP 252* 231* 129*    Studies/Results: Mr Foot Right W Wo Contrast  04/19/2011  *RADIOLOGY REPORT*  Clinical Data: Nonhealing ulcer.  Question osteomyelitis.  MRI OF THE RIGHT  FOREFOOT WITHOUT AND WITH CONTRAST  Technique:  Multiplanar, multisequence MR imaging was performed both before and after administration of intravenous contrast.  Contrast: 20mL MULTIHANCE GADOBENATE DIMEGLUMINE 529 MG/ML IV SOLN  Comparison: Radiographs 04/17/2011.  Findings: There is a large irregular complex fluid collection within the plantar aspect of the forefoot.  This lies deep to a skin ulcer plantar to the second metatarsal head.  This primarily lies superficial to the plantar fascia, although extends more deeply in some areas and is associated with the flexor tendon sheaths.  Distally, there is extension of this fluid collection along the flexor tendons of the second ray into the plantar aspect of the second toe.  A component within the toe measures up to 1.8 cm in diameter.  The primary collection within the plantar aspect of the forefoot measures 2.5 x 4.2 x 7.4 cm and is associated with multiple foci of low T1 signal with susceptibility artifact, likely representing soft tissue emphysema.  There is a moderate effusion of the second metatarsal phalangeal joint with diffuse synovial enhancement following contrast.  There is also diffuse low-level marrow edema within the second metatarsal head and the second proximal phalanx.  Although gross cortical destruction is not identified, these osseous changes are suspicious for early osteomyelitis.  The other toes and metatarsals appear unremarkable.  There is diffuse forefoot soft tissue edema and enhancement consistent with cellulitis and/or myofasciitis.  No other focal fluid collections are identified.  IMPRESSION:  1.  Large complex abscess within the plantar aspect of the forefoot with distal extension along the flexor tendons into the second  toe. There is associated soft tissue emphysema. 2.  Probable early osteomyelitis of the second metatarsal head and second proximal phalanx.  Suspected septic arthritis of the second metatarsal phalangeal joint. 3.   Diffuse forefoot cellulitis and probable myofasciitis.  These results were called by telephone on 04/19/2011  at  1335 hours to  the patient's nurse Lauren, who verbally acknowledged these results.  Original Report Authenticated By: Gerrianne Scale, M.D.   04/18/2011 Lower extremity Arterial Doppler Bilaterally Summary: Normal ABI's and pedal waveforms at rest.  Medications:  I have reviewed the patient's current medications. Scheduled:    . heparin  5,000 Units Subcutaneous Q8H  . insulin aspart  0-15 Units Subcutaneous TID WC  . lisinopril  10 mg Oral Daily  . piperacillin-tazobactam (ZOSYN)  IV  3.375 g Intravenous Q8H  . polyethylene glycol  17 g Oral Daily  . vancomycin  1,000 mg Intravenous Q8H  . DISCONTD: insulin aspart  0-9 Units Subcutaneous TID WC   Continuous:  ONG:[id number], morphine Anti-infectives     Start     Dose/Rate Route Frequency Ordered Stop   04/18/11 0300   piperacillin-tazobactam (ZOSYN) IVPB 3.375 g        3.375 g 12.5 mL/hr over 240 Minutes Intravenous 3 times per day 04/18/11 0239     04/18/11 0300   vancomycin (VANCOCIN) IVPB 1000 mg/200 mL premix        1,000 mg 200 mL/hr over 60 Minutes Intravenous Every 8 hours 04/18/11 0240     04/17/11 2200   vancomycin (VANCOCIN) IVPB 1000 mg/200 mL premix        1,000 mg 200 mL/hr over 60 Minutes Intravenous  Once 04/17/11 2200 04/17/11 2315   04/17/11 2200   piperacillin-tazobactam (ZOSYN) IVPB 3.375 g        3.375 g 12.5 mL/hr over 240 Minutes Intravenous  Once 04/17/11 2200 04/17/11 2313          Assessment/Plan: 50 yo male with PMH of uncontrolled DM, chronic foot ulcer, HTN, tobacco use and alcohol use presenting with 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  1. Foot wound- Chronic foot ulcer, now infected. On admission, Xray did not show osteo. MRI did show osteo and soft tissue swelling. Wound culture final results show Group B strep, although this was probably an ineffective culture.  WBC stable at 12.3. Arterial dopplers were wnl. Wound care and Ortho consulted. Patient will have surgery tomorrow (04/21/11) for amputation of 2nd ray due to osteomyelitis. For now, continue Vanc and Zosyn, as well as Morphine for pain control. PT/OT consults to help with ambulation. He will need 4 weeks of antibiotics post-op. For now, we will plan on continuing IV abx at least 24 hours post-op then transition to PO for 4 weeks. He will also need home health at home, which I will order today since he will likely be going home over the weekend. Continue to monitor patient and dress wounds, per wound consult.   2. DM- Uncontrolled. On Metformin as an outpatient. Admitted with CBG's around 300 and started on SSI. Sensitivity changed to moderate yesterday. Required 16U in 24 hours. Since patient has an acute infection, we have not started Lantus yet. Will readdress this issue and possibly start Lantus, although I am not sure if patient will take it as an outpatient. Continue to monitor. Continue moderate SSI.  3. HTN- On home dose of Lisinopril 10mg . Blood pressure stable. Continue to monitor.  4. Tobacco use- 1/2 ppd smoker. He  did not seem aware that smoking made his circulation worse, and had numerous effects on his health. He does not state any desire to quit. He was offered Nicotine patches which he has refused.  5. Alcohol use- On CIWA protocol, but has not had any s/sx of withdrawal since admission  6. Social- Will have Theresia Bough CSW see patient today to discuss post-operative plans. Ortho states he will be stable for discharge following operation but he will need to be non-weightbearing which may require him to have assistance. I am not sure if going to his home will allow for this. Will ask Nelva Bush to further evaluate.  7. PPx- Heparin SQ   8. FEN/GI- Carb modified diet. IVF to [facility].   9. Dispo- Pending surgery and overall clinical improvement.   Code: Full PCP: Ashley Royalty  LOS: 3 days    Steven [name] 04/20/2011, 1:31 PM

## 2011-04-20 NOTE — Progress Notes (Signed)
  Subjective:    Patient ID: Steven [name], male    DOB: 1960-07-26, 50 y.o.   MRN: [id number]  HPI  Seen and examined.  Steven [name] feels good.  I fully agree with surg planned for tomorrow by Dr. Lajoyce Corners.  Will have PT see to begin instructing on non weight bearing he needs post op.    Review of Systems     Objective:   Physical Exam        Assessment & Plan:

## 2011-04-20 NOTE — Progress Notes (Signed)
Clinical Social Worker provided pt. With the Department of Social Services number contact number to follow up on his food stamp amount each month. No additional needs requested. Clinical Social Worker signing off. Theresia Bough, MSW, Theresia Majors [phone]

## 2011-04-20 NOTE — Progress Notes (Signed)
ANTIBIOTIC CONSULT NOTE - FOLLOW UP  Pharmacy Consult for Vancomycin, Zosyn Indication: Nonhealing foot ulcer  No Known Allergies  Patient Measurements: Height: 6\' 4"  (193 cm) Weight: 204 lb 14.4 oz (92.942 kg) IBW/kg (Calculated) : 86.8    Vital Signs: Temp: 98.7 F (37.1 C) (11/08 1402) Temp src: Oral (11/08 1402) BP: 115/73 mmHg (11/08 1402) Pulse Rate: 74  (11/08 1402) Intake/Output from previous day: 11/07 0701 - 11/08 0700 In: 1340 [P.O.:960; I.V.:80; IV Piggyback:300] Out: 2775 [Urine:2775] Intake/Output from this shift: Total I/O In: 480 [P.O.:480] Out: 600 [Urine:600]  Labs:  [facility] 04/20/11 0654 04/19/11 0605 04/18/11 0623  WBC 12.3* 12.9* 12.9*  HGB 13.6 13.7 13.7  PLT 251 228 226  LABCREA -- -- --  CREATININE 0.54 0.54 0.53   Estimated Creatinine Clearance: 132.6 ml/min (by C-G formula based on Cr of 0.54).  Basename 04/20/11 1300  VANCOTROUGH 10.4  VANCOPEAK --  VANCORANDOM --  GENTTROUGH --  GENTPEAK --  GENTRANDOM --  TOBRATROUGH --  TOBRAPEAK --  TOBRARND --  AMIKACINPEAK --  AMIKACINTROU --  AMIKACIN --     Microbiology: Recent Results (from the past 720 hour(s))  WOUND CULTURE     Status: Normal   Collection Time   04/18/11  3:30 AM      Component Value Range Status Comment   Specimen Description FOOT RIGHT   Final    Special Requests NONE   Final    Gram Stain     Final    Value: ABUNDANT WBC PRESENT, PREDOMINANTLY PMN     NO SQUAMOUS EPITHELIAL CELLS SEEN     ABUNDANT GRAM NEGATIVE RODS     MODERATE GRAM POSITIVE COCCI     IN PAIRS IN CHAINS   Culture     Final    Value: MODERATE GROUP B STREP(S.AGALACTIAE)ISOLATED     Note: TESTING AGAINST S. AGALACTIAE NOT ROUTINELY PERFORMED DUE TO PREDICTABILITY OF AMP/PEN/VAN SUSCEPTIBILITY.   Report Status 04/20/2011 FINAL   Final     Anti-infectives     Start     Dose/Rate Route Frequency Ordered Stop   04/18/11 0300  piperacillin-tazobactam (ZOSYN) IVPB 3.375 g       3.375  g 12.5 mL/hr over 240 Minutes Intravenous 3 times per day 04/18/11 0239     04/18/11 0300   vancomycin (VANCOCIN) IVPB 1000 mg/200 mL premix        1,000 mg 200 mL/hr over 60 Minutes Intravenous Every 8 hours 04/18/11 0240     04/17/11 2200   vancomycin (VANCOCIN) IVPB 1000 mg/200 mL premix        1,000 mg 200 mL/hr over 60 Minutes Intravenous  Once 04/17/11 2200 04/17/11 2315   04/17/11 2200  piperacillin-tazobactam (ZOSYN) IVPB 3.375 g       3.375 g 12.5 mL/hr over 240 Minutes Intravenous  Once 04/17/11 2200 04/17/11 2313          Assessment: Results of MRI noted: Probably early osteo. Vancomycin trough = 10.4 mcg/ml.  For Osteo, goal for Vancomycin should be 15-20 mcg/ml.  Goal of Therapy:  Vancomycin trough level 15-20 mcg/ml  Plan:  Increase Vancomycin to 1500 mg IV q 8 hours. Continue Zosyn for now, same schedule.   Tytianna Greenley, Elisha Headland, Pharm.D. 04/20/2011 2:17 PM

## 2011-04-20 NOTE — Progress Notes (Signed)
Seen and examined.  Please see my separate note

## 2011-04-20 NOTE — Progress Notes (Signed)
Inpatient Diabetes Program Recommendations  AACE/ADA: New Consensus Statement on Inpatient Glycemic Control (2009)  Target Ranges:  Prepandial:   less than 140 mg/dL      Peak postprandial:   less than 180 mg/dL (1-2 hours)      Critically ill patients:  140 - 180 mg/dL   Reason for Visit: Pt with infection and cbg's running greater than 200mg /dL  Inpatient Diabetes Program Recommendations Insulin - Basal: Fasting glucose levels still high and needs Lantus at least 10-15 uinits daily or HS while here.  Due to high HgBA1C, pt oculd benefit from Lantus at home as well.  Note: Noted hyperglycemia with probable cause as infection.  See AACE/ADA Concensus Statement on glucose control while in the hospital.  If basal insulin is not going to be utilized while here, then another means of glucose control would be in order. If moderate correction with meals does not normalize glucose, then consider correction q 4hrs and/or  increase to resistant correction.  If Lantus were to be used, less correction would most probably be needed.  Considering the HgBA1C results, of 9.8%, the uncontrolled hyperlgycemia is not temporary but has been ongoing.

## 2011-04-20 NOTE — Progress Notes (Signed)
  Review of the patient's MRI scan shows a midfoot abscess as well as osteomyelitis of the second metatarsal with ulceration.  We'll plan for right foot second ray amputation Friday morning. Plan for application of tissue graft possible placement of antibiotic beads.  Patient will need to be nonweightbearing postoperatively. Okay for discharge after surgery. Would recommend 4 weeks of antibiotics postoperatively.

## 2011-04-20 NOTE — Progress Notes (Signed)
Noted order for possible HHPT and wound care, will need specific orders for wound care, must be complex dressing for insurance to cover. Will continue to follow.  CRoyal RN MPH

## 2011-04-21 ENCOUNTER — Inpatient Hospital Stay (HOSPITAL_COMMUNITY): Payer: Medicaid Other | Admitting: Anesthesiology

## 2011-04-21 ENCOUNTER — Encounter (HOSPITAL_COMMUNITY): Payer: Self-pay | Admitting: Anesthesiology

## 2011-04-21 ENCOUNTER — Other Ambulatory Visit: Payer: Self-pay | Admitting: Orthopedic Surgery

## 2011-04-21 ENCOUNTER — Encounter (HOSPITAL_COMMUNITY)
Admission: EM | Disposition: A | Discharge status: Home / Self Care | Payer: Self-pay | Source: Home / Self Care | Attending: Family Medicine

## 2011-04-21 HISTORY — PX: AMPUTATION: SHX166

## 2011-04-21 LAB — CBC
HCT: 41.6 % (ref 39.0–52.0)
Hemoglobin: 14.4 g/dL (ref 13.0–17.0)
MCH: 30.2 pg (ref 26.0–34.0)
MCV: 87.2 fL (ref 78.0–100.0)
Platelets: 286 10*3/uL (ref 150–400)
RBC: 4.77 MIL/uL (ref 4.22–5.81)
WBC: 11.6 10*3/uL — ABNORMAL HIGH (ref 4.0–10.5)

## 2011-04-21 LAB — BASIC METABOLIC PANEL
CO2: 29 mEq/L (ref 19–32)
Calcium: 10.5 mg/dL (ref 8.4–10.5)
Chloride: 96 mEq/L (ref 96–112)
Glucose, Bld: 223 mg/dL — ABNORMAL HIGH (ref 70–99)
Potassium: 4.2 mEq/L (ref 3.5–5.1)
Sodium: 134 mEq/L — ABNORMAL LOW (ref 135–145)

## 2011-04-21 LAB — GLUCOSE, CAPILLARY
Glucose-Capillary: 222 mg/dL — ABNORMAL HIGH (ref 70–99)
Glucose-Capillary: 200 mg/dL — ABNORMAL HIGH (ref 70–99)

## 2011-04-21 LAB — PROTIME-INR: INR: 1.04 (ref 0.00–1.49)

## 2011-04-21 SURGERY — AMPUTATION, FOOT, RAY
Anesthesia: General | Site: Foot | Laterality: Right | Wound class: Dirty or Infected

## 2011-04-21 MED ORDER — ONDANSETRON HCL 4 MG/2ML IJ SOLN
INTRAMUSCULAR | Status: DC | PRN
  Administered 2011-04-21: 4 mg via INTRAVENOUS

## 2011-04-21 MED ORDER — FENTANYL CITRATE 0.05 MG/ML IJ SOLN
INTRAMUSCULAR | Status: DC | PRN
  Administered 2011-04-21: 100 ug via INTRAVENOUS
  Administered 2011-04-21: 50 ug via INTRAVENOUS

## 2011-04-21 MED ORDER — VANCOMYCIN HCL 1000 MG IV SOLR
1000.0000 mg | INTRAVENOUS | Status: DC | PRN
  Administered 2011-04-21: 1.5 g via INTRAVENOUS

## 2011-04-21 MED ORDER — ONDANSETRON HCL 4 MG PO TABS
4.0000 mg | ORAL_TABLET | Freq: Four times a day (QID) | ORAL | Status: DC | PRN
  Administered 2011-04-24: 4 mg via ORAL
  Filled 2011-04-21: qty 1

## 2011-04-21 MED ORDER — INSULIN ASPART 100 UNIT/ML ~~LOC~~ SOLN
0.0000 [IU] | Freq: Three times a day (TID) | SUBCUTANEOUS | Status: DC
  Administered 2011-04-21: 3 [IU] via SUBCUTANEOUS
  Administered 2011-04-21: 5 [IU] via SUBCUTANEOUS
  Administered 2011-04-22: 3 [IU] via SUBCUTANEOUS

## 2011-04-21 MED ORDER — METOCLOPRAMIDE HCL 10 MG PO TABS: 5.0000 mg | ORAL_TABLET | Freq: Three times a day (TID) | ORAL | Status: DC | PRN

## 2011-04-21 MED ORDER — OXYCODONE-ACETAMINOPHEN 5-325 MG PO TABS
1.0000 | ORAL_TABLET | ORAL | Status: DC | PRN
  Administered 2011-04-21 – 2011-04-24 (×10): 2 via ORAL
  Administered 2011-04-24: 1 via ORAL
  Administered 2011-04-24 – 2011-04-25 (×5): 2 via ORAL
  Filled 2011-04-21: qty 1
  Filled 2011-04-21 (×15): qty 2

## 2011-04-21 MED ORDER — ONDANSETRON HCL 4 MG/2ML IJ SOLN: 4.0000 mg | Freq: Four times a day (QID) | INTRAMUSCULAR | Status: DC | PRN

## 2011-04-21 MED ORDER — METHOCARBAMOL 500 MG PO TABS
500.0000 mg | ORAL_TABLET | Freq: Four times a day (QID) | ORAL | Status: DC | PRN
  Administered 2011-04-23 – 2011-04-24 (×2): 500 mg via ORAL
  Filled 2011-04-21 (×2): qty 1

## 2011-04-21 MED ORDER — METHOCARBAMOL 100 MG/ML IJ SOLN
500.0000 mg | Freq: Four times a day (QID) | INTRAVENOUS | Status: DC | PRN
  Filled 2011-04-21: qty 5

## 2011-04-21 MED ORDER — MEPERIDINE HCL 25 MG/ML IJ SOLN: 6.2500 mg | INTRAMUSCULAR | Status: DC | PRN

## 2011-04-21 MED ORDER — LIDOCAINE HCL (CARDIAC) 20 MG/ML IV SOLN
INTRAVENOUS | Status: DC | PRN
  Administered 2011-04-21: 100 mg via INTRAVENOUS

## 2011-04-21 MED ORDER — LACTATED RINGERS IV SOLN
INTRAVENOUS | Status: DC
  Administered 2011-04-21: 11:00:00 via INTRAVENOUS

## 2011-04-21 MED ORDER — DIPHENHYDRAMINE HCL 12.5 MG/5ML PO ELIX
12.5000 mg | ORAL_SOLUTION | ORAL | Status: DC | PRN
  Administered 2011-04-22 – 2011-04-23 (×2): 25 mg via ORAL
  Filled 2011-04-21 (×2): qty 10

## 2011-04-21 MED ORDER — SODIUM CHLORIDE 0.9 % IR SOLN
Status: DC | PRN
  Administered 2011-04-21: 1000 mL

## 2011-04-21 MED ORDER — WARFARIN SODIUM 7.5 MG PO TABS
7.5000 mg | ORAL_TABLET | Freq: Once | ORAL | Status: AC
  Administered 2011-04-21: 7.5 mg via ORAL
  Filled 2011-04-21 (×2): qty 1

## 2011-04-21 MED ORDER — HYDROMORPHONE HCL PF 1 MG/ML IJ SOLN
0.2500 mg | INTRAMUSCULAR | Status: DC | PRN
  Administered 2011-04-21 (×2): 0.5 mg via INTRAVENOUS

## 2011-04-21 MED ORDER — LACTATED RINGERS IV SOLN
INTRAVENOUS | Status: DC | PRN
  Administered 2011-04-21: 07:00:00 via INTRAVENOUS

## 2011-04-21 MED ORDER — HYDROCODONE-ACETAMINOPHEN 5-325 MG PO TABS
1.0000 | ORAL_TABLET | ORAL | Status: DC | PRN
  Administered 2011-04-22: 2 via ORAL
  Filled 2011-04-21: qty 2

## 2011-04-21 MED ORDER — SODIUM CHLORIDE 0.9 % IV SOLN: INTRAVENOUS | Status: DC

## 2011-04-21 MED ORDER — PROPOFOL 10 MG/ML IV EMUL
INTRAVENOUS | Status: DC | PRN
  Administered 2011-04-21: 200 mg via INTRAVENOUS

## 2011-04-21 MED ORDER — SENNOSIDES-DOCUSATE SODIUM 8.6-50 MG PO TABS
1.0000 | ORAL_TABLET | Freq: Every evening | ORAL | Status: DC | PRN
  Administered 2011-04-21: 1 via ORAL
  Filled 2011-04-21: qty 1

## 2011-04-21 MED ORDER — PATIENT'S GUIDE TO USING COUMADIN BOOK
Freq: Once | Status: DC
  Filled 2011-04-21: qty 1

## 2011-04-21 MED ORDER — PROMETHAZINE HCL 25 MG/ML IJ SOLN: 6.2500 mg | INTRAMUSCULAR | Status: DC | PRN

## 2011-04-21 MED ORDER — WARFARIN VIDEO
Freq: Once | Status: DC
  Filled 2011-04-21: qty 1

## 2011-04-21 MED ORDER — METOCLOPRAMIDE HCL 5 MG/ML IJ SOLN
5.0000 mg | Freq: Three times a day (TID) | INTRAMUSCULAR | Status: DC | PRN
  Filled 2011-04-21: qty 2

## 2011-04-21 MED ORDER — HYDROMORPHONE HCL PF 1 MG/ML IJ SOLN
0.5000 mg | INTRAMUSCULAR | Status: DC | PRN
  Administered 2011-04-21: 1 mg via INTRAVENOUS
  Filled 2011-04-21: qty 1

## 2011-04-21 MED ORDER — INSULIN GLARGINE 100 UNIT/ML ~~LOC~~ SOLN
10.0000 [IU] | Freq: Every day | SUBCUTANEOUS | Status: DC
  Administered 2011-04-21 – 2011-04-25 (×4): 10 [IU] via SUBCUTANEOUS
  Filled 2011-04-21 (×2): qty 3

## 2011-04-21 SURGICAL SUPPLY — 44 items
BANDAGE COBAN STERILE 4 (GAUZE/BANDAGES/DRESSINGS) ×2 IMPLANT
BANDAGE ESMARK 6X9 LF (GAUZE/BANDAGES/DRESSINGS) IMPLANT
BLADE SAW SGTL MED 73X18.5 STR (BLADE) IMPLANT
BNDG COHESIVE 4X5 TAN STRL (GAUZE/BANDAGES/DRESSINGS) IMPLANT
BNDG COHESIVE 6X5 TAN STRL LF (GAUZE/BANDAGES/DRESSINGS) IMPLANT
BNDG ESMARK 6X9 LF (GAUZE/BANDAGES/DRESSINGS)
CLOTH BEACON ORANGE TIMEOUT ST (SAFETY) ×2 IMPLANT
CUFF TOURNIQUET SINGLE 34IN LL (TOURNIQUET CUFF) IMPLANT
CUFF TOURNIQUET SINGLE 44IN (TOURNIQUET CUFF) IMPLANT
DRAPE U-SHAPE 47X51 STRL (DRAPES) ×2 IMPLANT
DRSG ADAPTIC 3X8 NADH LF (GAUZE/BANDAGES/DRESSINGS) ×2 IMPLANT
DRSG PAD ABDOMINAL 8X10 ST (GAUZE/BANDAGES/DRESSINGS) ×2 IMPLANT
DURAPREP 26ML APPLICATOR (WOUND CARE) ×2 IMPLANT
ELECT REM PT RETURN 9FT ADLT (ELECTROSURGICAL) ×2
ELECTRODE REM PT RTRN 9FT ADLT (ELECTROSURGICAL) ×1 IMPLANT
GLOVE BIOGEL PI IND STRL 9 (GLOVE) ×1 IMPLANT
GLOVE BIOGEL PI INDICATOR 9 (GLOVE) ×1
GLOVE SURG ORTHO 9.0 STRL STRW (GLOVE) ×2 IMPLANT
GOWN PREVENTION PLUS XLARGE (GOWN DISPOSABLE) ×2 IMPLANT
GOWN SRG XL XLNG 56XLVL 4 (GOWN DISPOSABLE) ×1 IMPLANT
GOWN STRL NON-REIN XL XLG LVL4 (GOWN DISPOSABLE) ×1
KIT BASIN OR (CUSTOM PROCEDURE TRAY) ×2 IMPLANT
KIT ROOM TURNOVER OR (KITS) ×2 IMPLANT
MANIFOLD NEPTUNE II (INSTRUMENTS) ×2 IMPLANT
MATRIX SURGICAL PSM 7X10CM (Tissue) ×2 IMPLANT
MICROMATRIX 500MG (Tissue) ×2 IMPLANT
NS IRRIG 1000ML POUR BTL (IV SOLUTION) ×2 IMPLANT
PACK ORTHO EXTREMITY (CUSTOM PROCEDURE TRAY) ×2 IMPLANT
PAD ARMBOARD 7.5X6 YLW CONV (MISCELLANEOUS) ×2 IMPLANT
PAD CAST 4YDX4 CTTN HI CHSV (CAST SUPPLIES) IMPLANT
PADDING CAST COTTON 4X4 STRL (CAST SUPPLIES)
PADDING WEBRIL 4 STERILE (GAUZE/BANDAGES/DRESSINGS) ×2 IMPLANT
SOLUTION PARTIC MCRMTRX 500MG (Tissue) ×1 IMPLANT
SPONGE GAUZE 4X4 12PLY (GAUZE/BANDAGES/DRESSINGS) ×2 IMPLANT
SPONGE LAP 18X18 X RAY DECT (DISPOSABLE) ×2 IMPLANT
STAPLER VISISTAT 35W (STAPLE) ×2 IMPLANT
STOCKINETTE IMPERVIOUS LG (DRAPES) IMPLANT
SUCTION FRAZIER TIP 10 FR DISP (SUCTIONS) ×2 IMPLANT
SUT ETHILON 2 0 PSLX (SUTURE) ×4 IMPLANT
TOWEL OR 17X24 6PK STRL BLUE (TOWEL DISPOSABLE) ×2 IMPLANT
TOWEL OR 17X26 10 PK STRL BLUE (TOWEL DISPOSABLE) ×2 IMPLANT
TUBE CONNECTING 12X1/4 (SUCTIONS) ×2 IMPLANT
UNDERPAD 30X30 INCONTINENT (UNDERPADS AND DIAPERS) ×2 IMPLANT
WATER STERILE IRR 1000ML POUR (IV SOLUTION) IMPLANT

## 2011-04-21 NOTE — Op Note (Signed)
04/17/2011 - 04/21/2011  8:16 AM  PATIENT:  Steven [name]  50 y.o. male  PRE-OPERATIVE DIAGNOSIS:  RIGHT FOOT 2ND RAY OSTEOMYELITIS  POST-OPERATIVE DIAGNOSIS:  RIGHT FOOT 2ND RAY OSTEOMYELITIS and large plantar abscess.  PROCEDURE:  Procedure(s): AMPUTATION RAY. Irrigation and debridement abscess plantar aspect right foot. Application of a cell tissue graft and a cell powder   SURGEON:  Surgeon(s): Nadara Mustard, MD      ANESTHESIA:   general  EBL:            SPECIMEN:  Source of Specimen:  Right foot second ray  DISPOSITION OF SPECIMEN:  PATHOLOGY    TOURNIQUET:  * No tourniquets in log *  DICTATION: .Dragon Dictation patient was brought to OR room 10 and underwent a general anesthetic. After adequate levels of anesthesia were obtained patient's right lower extremity was prepped using DuraPrep and draped into a sterile field. A V. incision was made around the dorsum of the second ray the second ray in the tissue plantar was resected in one block of tissue. There was a large abscess on the plantar aspect of the right foot and this abscess with debridement of skin soft tissue muscle and tendon. This was treated back to healthy viable tissue. This left a large soft tissue defect plantarly. Hemostasis was obtained. The dorsal incision was closed using 2-0 nylon. The plantar wound was then repaired using the Ace cell tissue graft. The Ace cell powder was also applied. The wounds were covered with Adaptic orthopedic sponges ABDs dressing web roll and Coban. Patient was extubated taken to the PACU in stable condition.  PLAN OF CARE: Admit to inpatient   PATIENT DISPOSITION:  PACU - hemodynamically stable.   Delay start of Pharmacological VTE agent (>24hrs) due to surgical blood loss or risk of bleeding:  no

## 2011-04-21 NOTE — Progress Notes (Signed)
ANTICOAGULATION CONSULT NOTE - Initial Consult  Pharmacy Consult for coumadin Indication: VTE prophylaxis and   No Known Allergies  Patient Measurements: Height: 6\' 4"  (193 cm) Weight: 204 lb 14.4 oz (92.942 kg) IBW/kg (Calculated) : 86.8  Adjusted Body Weight:   Vital Signs: Temp: 98.9 F (37.2 C) (11/09 1431) Temp src: Oral (11/09 1431) BP: 116/69 mmHg (11/09 1431) Pulse Rate: 72  (11/09 1431)  Labs:  Basename 04/21/11 0520 04/20/11 0654 04/19/11 0605  HGB 14.4 13.6 --  HCT 41.6 39.7 40.1  PLT 286 251 228  APTT -- -- --  LABPROT -- -- --  INR -- -- --  HEPARINUNFRC -- -- --  CREATININE 0.58 0.54 0.54  CKTOTAL -- -- --  CKMB -- -- --  TROPONINI -- -- --   Estimated Creatinine Clearance: 132.6 ml/min (by C-G formula based on Cr of 0.58).  Medical History: Past Medical History  Diagnosis Date  . DM (diabetes mellitus)   . HTN (hypertension)   . History of DVT (deep vein thrombosis)   . Personal history of diabetic foot ulcer   . Peripheral vascular disease     Medications:  Scheduled:    . ceFAZolin (ANCEF) IV  1 g Intravenous 60 min Pre-Op  . chlorhexidine  60 mL Topical Once  . heparin  5,000 Units Subcutaneous Q8H  . insulin aspart  0-9 Units Subcutaneous TID WC  . insulin glargine  10 Units Subcutaneous Q1200  . lisinopril  10 mg Oral Daily  . patient's guide to using coumadin book   Does not apply Once  . piperacillin-tazobactam (ZOSYN)  IV  3.375 g Intravenous Q8H  . polyethylene glycol  17 g Oral Daily  . vancomycin  1,500 mg Intravenous Q8H  . vancomycin  500 mg Intravenous NOW  . warfarin  7.5 mg Oral Once  . warfarin   Does not apply Once  . DISCONTD: insulin aspart  0-15 Units Subcutaneous TID WC    Assessment: 50 yo male s/p ortho procedure will be started on coumadin for DVT px. Coumadin score = 10 Goal of Therapy:  INR 2-3   Plan:  1) Coumadin 7.5mg  po x1 2) Daily PT/INR 3) Stat PT/INR Sadiel Mota, Tsz-Yin 04/21/2011,5:49 PM

## 2011-04-21 NOTE — Transfer of Care (Signed)
Immediate Anesthesia Transfer of Care Note  Patient: Steven [name]  Procedure(s) Performed:  AMPUTATION RAY - Rt foot 2nd ray ampt  Patient Location: PACU  Anesthesia Type: General  Level of Consciousness: awake, alert  and oriented  Airway & Oxygen Therapy: Patient Spontanous Breathing and Patient connected to nasal cannula oxygen  Post-op Assessment: Report given to PACU RN  Post vital signs: Reviewed and stable  Complications: No apparent anesthesia complications

## 2011-04-21 NOTE — Preoperative (Signed)
Beta Blockers   Reason not to administer Beta Blockers:Not Applicable 

## 2011-04-21 NOTE — Progress Notes (Signed)
Continue to follow for d/c needs, await dressing change orders, and will ask for HHPT visit , this will be limited by the fact that pt has Medicaid , will also ask for [facility] aide visits.  CRoyal RN MPH

## 2011-04-21 NOTE — Progress Notes (Signed)
PGY-1 Daily Progress Note Family Medicine Teaching Service Steven Caudle M. Tylor Gambrill, MD Service Pager: [phone]  Subjective: Patient had surgery this morning with ortho. According to Dr. Audrie Lia op note, it appears everything went well. We will follow up patient later today since he is not available for interview at this time.  Objective: Vital signs in last 24 hours: Temp:  [97.5 F (36.4 C)-98.7 F (37.1 C)] 97.8 F (36.6 C) (11/09 0830) Pulse Rate:  [67-88] 74  (11/09 0900) Resp:  [18-20] 18  (11/09 0900) BP: (115-139)/(70-98) 115/70 mmHg (11/09 0825) SpO2:  [96 %-100 %] 98 % (11/09 0930) Weight change:  Last BM Date: 04/16/11  Intake/Output from previous day: 11/08 0701 - 11/09 0700 In: 1420 [P.O.:1040; I.V.:80; IV Piggyback:300] Out: 3100 [Urine:3100] Intake/Output this shift: Total I/O In: 1200 [I.V.:700; IV Piggyback:500] Out: -   Physical Exam: Unable to do exam.  Lab Results:  [facility] 04/21/11 0520 04/20/11 0654  WBC 11.6* 12.3*  HGB 14.4 13.6  HCT 41.6 39.7  PLT 286 251   BMET  Basename 04/21/11 0520 04/20/11 0654  NA 134* 131*  K 4.2 3.8  CL 96 94*  CO2 29 25  GLUCOSE 223* 212*  BUN 7 7  CREATININE 0.58 0.54  CALCIUM 10.5 9.9   CBG (last 3)   Basename 04/21/11 0826 04/21/11 0543 04/20/11 2128  GLUCAP 222* 255* 237*    Studies/Results: Mr Foot Right W Wo Contrast  04/19/2011  *RADIOLOGY REPORT*  Clinical Data: Nonhealing ulcer.  Question osteomyelitis.  MRI OF THE RIGHT FOREFOOT WITHOUT AND WITH CONTRAST  Technique:  Multiplanar, multisequence MR imaging was performed both before and after administration of intravenous contrast.  Contrast: 20mL MULTIHANCE GADOBENATE DIMEGLUMINE 529 MG/ML IV SOLN  Comparison: Radiographs 04/17/2011.  Findings: There is a large irregular complex fluid collection within the plantar aspect of the forefoot.  This lies deep to a skin ulcer plantar to the second metatarsal head.  This primarily lies superficial to the plantar  fascia, although extends more deeply in some areas and is associated with the flexor tendon sheaths.  Distally, there is extension of this fluid collection along the flexor tendons of the second ray into the plantar aspect of the second toe.  A component within the toe measures up to 1.8 cm in diameter.  The primary collection within the plantar aspect of the forefoot measures 2.5 x 4.2 x 7.4 cm and is associated with multiple foci of low T1 signal with susceptibility artifact, likely representing soft tissue emphysema.  There is a moderate effusion of the second metatarsal phalangeal joint with diffuse synovial enhancement following contrast.  There is also diffuse low-level marrow edema within the second metatarsal head and the second proximal phalanx.  Although gross cortical destruction is not identified, these osseous changes are suspicious for early osteomyelitis.  The other toes and metatarsals appear unremarkable.  There is diffuse forefoot soft tissue edema and enhancement consistent with cellulitis and/or myofasciitis.  No other focal fluid collections are identified.  IMPRESSION:  1.  Large complex abscess within the plantar aspect of the forefoot with distal extension along the flexor tendons into the second toe. There is associated soft tissue emphysema. 2.  Probable early osteomyelitis of the second metatarsal head and second proximal phalanx.  Suspected septic arthritis of the second metatarsal phalangeal joint. 3.  Diffuse forefoot cellulitis and probable myofasciitis.  These results were called by telephone on 04/19/2011  at  1335 hours to  the patient's nurse Steven [name], who verbally acknowledged  these results.  Original Report Authenticated By: Steven [name], M.D.   04/18/2011 Lower extremity Arterial Doppler Bilaterally Summary: Normal ABI's and pedal waveforms at rest.  Medications:  I have reviewed the patient's current medications. Scheduled:    . ceFAZolin (ANCEF) IV  1 g Intravenous  60 min Pre-Op  . chlorhexidine  60 mL Topical Once  . heparin  5,000 Units Subcutaneous Q8H  . insulin aspart  0-15 Units Subcutaneous TID WC  . lisinopril  10 mg Oral Daily  . piperacillin-tazobactam (ZOSYN)  IV  3.375 g Intravenous Q8H  . polyethylene glycol  17 g Oral Daily  . vancomycin  1,500 mg Intravenous Q8H  . vancomycin  500 mg Intravenous NOW  . DISCONTD: ceFAZolin (ANCEF) IV  1 g Intravenous 60 min Pre-Op  . DISCONTD: chlorhexidine  60 mL Topical Once  . DISCONTD: vancomycin  1,000 mg Intravenous Q8H   Continuous:  ZOX:[id number], ibuprofen, meperidine, morphine, promethazine, DISCONTD: sodium chloride Anti-infectives     Start     Dose/Rate Route Frequency Ordered Stop   04/21/11 0800   ceFAZolin (ANCEF) IVPB 1 g/50 mL premix        1 g 100 mL/hr over 30 Minutes Intravenous 60 min pre-op 04/20/11 1545 04/21/11 0142   04/20/11 2200   vancomycin (VANCOCIN) 1,500 mg in sodium chloride 0.9 % 500 mL IVPB        1,500 mg 250 mL/hr over 120 Minutes Intravenous Every 8 hours 04/20/11 1429     04/20/11 1538   ceFAZolin (ANCEF) IVPB 1 g/50 mL premix  Status:  Discontinued        1 g 100 mL/hr over 30 Minutes Intravenous 60 min pre-op 04/20/11 1538 04/20/11 1545   04/20/11 1430   vancomycin (VANCOCIN) 500 mg in sodium chloride 0.9 % 100 mL IVPB        500 mg 100 mL/hr over 60 Minutes Intravenous NOW 04/20/11 1425 04/21/11 1430   04/18/11 0300   piperacillin-tazobactam (ZOSYN) IVPB 3.375 g        3.375 g 12.5 mL/hr over 240 Minutes Intravenous 3 times per day 04/18/11 0239     04/18/11 0300   vancomycin (VANCOCIN) IVPB 1000 mg/200 mL premix  Status:  Discontinued        1,000 mg 200 mL/hr over 60 Minutes Intravenous Every 8 hours 04/18/11 0240 04/20/11 1429   04/17/11 2200   vancomycin (VANCOCIN) IVPB 1000 mg/200 mL premix        1,000 mg 200 mL/hr over 60 Minutes Intravenous  Once 04/17/11 2200 04/17/11 2315   04/17/11 2200   piperacillin-tazobactam (ZOSYN) IVPB  3.375 g        3.375 g 12.5 mL/hr over 240 Minutes Intravenous  Once 04/17/11 2200 04/17/11 2313          Assessment/Plan: 50 yo male with PMH of uncontrolled DM, chronic foot ulcer, HTN, tobacco use and alcohol use presenting with 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  1. Foot wound- Chronic foot ulcer p/w infection. On admission, Xray did not show osteo but MRI on 1//7 did show osteo and soft tissue swelling/abscess. Arterial dopplers were wnl. Wound care and Ortho consulted. 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 For now, continue Vanc and Zosyn, as well as Morphine for pain control. PT/OT consults to help with ambulation. He will need 4 weeks of antibiotics post-op. We will plan on continuing IV abx  at least 24 hours post-op then transition to PO for 4 weeks. He will also need home health for PT and wound care. Will write a specific order as requested by case management, per wound care. Continue to monitor patient and dress wound.  2. DM- Uncontrolled. On Metformin as an outpatient. Admitted with CBG's around 300 and started on SSI. Sensitivity changed to moderate.Marland Kitchen Required 21U of SSI in 24 hours. Since patient has an acute infection, I have not started Lantus yet. Given his consistently high CBG, will start Lantus today. We appreciate DM coordinator's help on this patient. I am afraid he will not continue insulin regimen as an outpatient, but I do agree with tighter control while in the hospital. Will give Lantus 10U + sensitive SSI and continue CBG monitoring.   3. HTN- On home dose of Lisinopril 10mg . Blood pressure stable. Continue to monitor.  4. Tobacco use- 1/2 ppd smoker. He does not state any desire to quit. He was offered Nicotine patches which he has refused.  5. Alcohol use- On CIWA protocol, but has not had any s/sx of withdrawal since admission  6. Social- Steven [name] CSW saw  patient discuss post-operative plans. Ortho states he will be stable for discharge following operation but he will need to be non-weightbearing which may require him to have assistance. Will follow-up with Steven [name] today on plan for dischage.  7. PPx- Heparin SQ   8. FEN/GI- Carb modified diet. IVF to [facility].   9. Dispo- Pending patient does well post-op as well as overall clinical improvement. Most likely will switch to PO abx and pain medication tomorrow with discharge on Sunday, if home health can be arranged.  Code: Full PCP: Steven [name]  LOS: 4 days   Steven [name] 04/21/2011, 10:00 AM

## 2011-04-21 NOTE — Anesthesia Preprocedure Evaluation (Signed)
Anesthesia Evaluation  Patient identified by MRN, date of birth, ID band Patient awake    Reviewed: Allergy & Precautions, H&P , NPO status , Patient's Chart, lab work & pertinent test results  Airway Mallampati: I TM Distance: >3 FB Neck ROM: Full    Dental  (+) Edentulous Upper and Edentulous Lower   Pulmonary Current Smoker,  clear to auscultation  Pulmonary exam normal       Cardiovascular hypertension, Pt. on medications + Valvular Problems/Murmurs Regular Normal    Neuro/Psych Peripheral neuropathy Negative Psych ROS   GI/Hepatic negative GI ROS, Neg liver ROS,   Endo/Other  Diabetes mellitus-, Poorly Controlled, Type 2, Insulin Dependent and Oral Hypoglycemic Agents  Renal/GU negative Renal ROS  Genitourinary negative   Musculoskeletal negative musculoskeletal ROS (+)   Abdominal (+)  Abdomen: soft.    Peds negative pediatric ROS (+)  Hematology negative hematology ROS (+)   Anesthesia Other Findings   Reproductive/Obstetrics negative OB ROS                           Anesthesia Physical Anesthesia Plan  ASA: III  Anesthesia Plan: General   Post-op Pain Management:    Induction: Intravenous  Airway Management Planned: LMA  Additional Equipment:   Intra-op Plan:   Post-operative Plan: Extubation in OR  Informed Consent: I have reviewed the patients History and Physical, chart, labs and discussed the procedure including the risks, benefits and alternatives for the proposed anesthesia with the patient or authorized representative who has indicated his/her understanding and acceptance.     Plan Discussed with: CRNA, Anesthesiologist and Surgeon  Anesthesia Plan Comments:         Anesthesia Quick Evaluation

## 2011-04-21 NOTE — H&P (Signed)
  Patient was reexamined today prior to surgery for a right foot second ray amputation. There is no change in his physical exam status. The history and physical is updated with no interval change.

## 2011-04-21 NOTE — Anesthesia Postprocedure Evaluation (Signed)
  Anesthesia Post-op Note  Patient: Steven [name]  Procedure(s) Performed:  AMPUTATION RAY - Rt foot 2nd ray ampt  Patient Location: PACU  Anesthesia Type: General  Level of Consciousness: awake, alert  and oriented  Airway and Oxygen Therapy: Patient Spontanous Breathing  Post-op Pain: none  Post-op Assessment: Post-op Vital signs reviewed, Patient's Cardiovascular Status Stable, Respiratory Function Stable, Patent Airway, No signs of Nausea or vomiting and Pain level controlled  Post-op Vital Signs: Reviewed and stable  Complications: No apparent anesthesia complications

## 2011-04-21 NOTE — Progress Notes (Signed)
Seen and examined.  Also discussed in rounds.  Steven [name] is comfortable in his room post op.  Does have some sharp foot pain.  Dressing clean and dry.  He has no nausea, chest pain or SOB.  Continue preop meds/diet.

## 2011-04-21 NOTE — Progress Notes (Signed)
Physical Therapy Treatment Patient Details Name: Steven [name] MRN: [id number] DOB: [birth year] Today's Date: 04/21/2011  Pt just arrived to room from OR. Will follow up tomorrow/POD #1.  Thanks!    [facility], PT [phone] 04/21/2011, 10:38 AM

## 2011-04-22 LAB — COMPREHENSIVE METABOLIC PANEL
AST: 23 U/L (ref 0–37)
Albumin: 2.5 g/dL — ABNORMAL LOW (ref 3.5–5.2)
CO2: 28 mEq/L (ref 19–32)
Calcium: 10.2 mg/dL (ref 8.4–10.5)
Creatinine, Ser: 1.21 mg/dL (ref 0.50–1.35)
GFR calc non Af Amer: 67 mL/min — ABNORMAL LOW (ref >90)
Sodium: 136 mEq/L (ref 135–145)
Total Protein: 7.2 g/dL (ref 6.0–8.3)

## 2011-04-22 LAB — CBC
Hemoglobin: 13.5 g/dL (ref 13.0–17.0)
MCH: 29.7 pg (ref 26.0–34.0)
MCV: 88.1 fL (ref 78.0–100.0)
RBC: 4.54 MIL/uL (ref 4.22–5.81)

## 2011-04-22 LAB — GLUCOSE, CAPILLARY
Glucose-Capillary: 217 mg/dL — ABNORMAL HIGH (ref 70–99)
Glucose-Capillary: 208 mg/dL — ABNORMAL HIGH (ref 70–99)

## 2011-04-22 MED ORDER — POLYETHYLENE GLYCOL 3350 17 G PO PACK
17.0000 g | PACK | Freq: Two times a day (BID) | ORAL | Status: DC
  Administered 2011-04-22 – 2011-04-24 (×5): 17 g via ORAL
  Filled 2011-04-22 (×7): qty 1

## 2011-04-22 MED ORDER — INSULIN ASPART 100 UNIT/ML ~~LOC~~ SOLN
0.0000 [IU] | Freq: Three times a day (TID) | SUBCUTANEOUS | Status: DC
  Administered 2011-04-22 (×2): 5 [IU] via SUBCUTANEOUS
  Administered 2011-04-23 (×2): 3 [IU] via SUBCUTANEOUS
  Administered 2011-04-23: 5 [IU] via SUBCUTANEOUS
  Administered 2011-04-24: 2 [IU] via SUBCUTANEOUS
  Administered 2011-04-24 (×2): 3 [IU] via SUBCUTANEOUS
  Administered 2011-04-25: 8 [IU] via SUBCUTANEOUS
  Administered 2011-04-25: 5 [IU] via SUBCUTANEOUS
  Filled 2011-04-22: qty 3

## 2011-04-22 MED ORDER — VANCOMYCIN HCL 1000 MG IV SOLR
1500.0000 mg | Freq: Two times a day (BID) | INTRAVENOUS | Status: DC
  Filled 2011-04-22: qty 1500

## 2011-04-22 MED ORDER — CLINDAMYCIN HCL 300 MG PO CAPS
600.0000 mg | ORAL_CAPSULE | Freq: Three times a day (TID) | ORAL | Status: DC
  Administered 2011-04-22 – 2011-04-25 (×10): 600 mg via ORAL
  Filled 2011-04-22 (×13): qty 2

## 2011-04-22 MED ORDER — CIPROFLOXACIN HCL 750 MG PO TABS
750.0000 mg | ORAL_TABLET | Freq: Two times a day (BID) | ORAL | Status: DC
  Administered 2011-04-22 – 2011-04-25 (×6): 750 mg via ORAL
  Filled 2011-04-22 (×8): qty 1

## 2011-04-22 NOTE — Progress Notes (Signed)
ANTIBIOTIC CONSULT NOTE - FOLLOW UP  Pharmacy Consult for Vancomycin, Zosyn Indication: Nonhealing foot ulcer  No Known Allergies  Patient Measurements: Height: 6\' 4"  (193 cm) Weight: 204 lb 14.4 oz (92.942 kg) IBW/kg (Calculated) : 86.8    Vital Signs: Temp: 97.4 F (36.3 C) (11/10 0620) BP: 112/70 mmHg (11/10 0620) Pulse Rate: 69  (11/10 0620) Intake/Output from previous day: 11/09 0701 - 11/10 0700 In: 1680 [P.O.:480; I.V.:700; IV Piggyback:500] Out: 1400 [Urine:1400] Intake/Output from this shift: Total I/O In: 175 [P.O.:175] Out: 500 [Urine:500]  Labs:  [facility] 04/22/11 0700 04/21/11 0520 04/20/11 0654  WBC 11.8* 11.6* 12.3*  HGB 13.5 14.4 13.6  PLT 286 286 251  LABCREA -- -- --  CREATININE 1.21 0.58 0.54   Estimated Creatinine Clearance: 87.7 ml/min (by C-G formula based on Cr of 1.21).  Basename 04/20/11 1300  VANCOTROUGH 10.4  VANCOPEAK --  VANCORANDOM --  GENTTROUGH --  GENTPEAK --  GENTRANDOM --  TOBRATROUGH --  TOBRAPEAK --  TOBRARND --  AMIKACINPEAK --  AMIKACINTROU --  AMIKACIN --     Microbiology: Recent Results (from the past 720 hour(s))  WOUND CULTURE     Status: Normal   Collection Time   04/18/11  3:30 AM      Component Value Range Status Comment   Specimen Description FOOT RIGHT   Final    Special Requests NONE   Final    Gram Stain     Final    Value: ABUNDANT WBC PRESENT, PREDOMINANTLY PMN     NO SQUAMOUS EPITHELIAL CELLS SEEN     ABUNDANT GRAM NEGATIVE RODS     MODERATE GRAM POSITIVE COCCI     IN PAIRS IN CHAINS   Culture     Final    Value: MODERATE GROUP B STREP(S.AGALACTIAE)ISOLATED     Note: TESTING AGAINST S. AGALACTIAE NOT ROUTINELY PERFORMED DUE TO PREDICTABILITY OF AMP/PEN/VAN SUSCEPTIBILITY.   Report Status 04/20/2011 FINAL   Final     Anti-infectives     Start     Dose/Rate Route Frequency Ordered Stop   04/22/11 1800   vancomycin (VANCOCIN) 1,500 mg in sodium chloride 0.9 % 500 mL IVPB        1,500 mg 250  mL/hr over 120 Minutes Intravenous Every 12 hours 04/22/11 1152     04/21/11 0800   ceFAZolin (ANCEF) IVPB 1 g/50 mL premix        1 g 100 mL/hr over 30 Minutes Intravenous 60 min pre-op 04/20/11 1545 04/21/11 0142   04/20/11 2200   vancomycin (VANCOCIN) 1,500 mg in sodium chloride 0.9 % 500 mL IVPB  Status:  Discontinued        1,500 mg 250 mL/hr over 120 Minutes Intravenous Every 8 hours 04/20/11 1429 04/22/11 1153   04/20/11 1538   ceFAZolin (ANCEF) IVPB 1 g/50 mL premix  Status:  Discontinued        1 g 100 mL/hr over 30 Minutes Intravenous 60 min pre-op 04/20/11 1538 04/20/11 1545   04/20/11 1430   vancomycin (VANCOCIN) 500 mg in sodium chloride 0.9 % 100 mL IVPB        500 mg 100 mL/hr over 60 Minutes Intravenous NOW 04/20/11 1425 04/21/11 1430   04/18/11 0300   piperacillin-tazobactam (ZOSYN) IVPB 3.375 g        3.375 g 12.5 mL/hr over 240 Minutes Intravenous 3 times per day 04/18/11 0239     04/18/11 0300   vancomycin (VANCOCIN) IVPB 1000 mg/200 mL premix  Status:  Discontinued        1,000 mg 200 mL/hr over 60 Minutes Intravenous Every 8 hours 04/18/11 0240 04/20/11 1429   04/17/11 2200   vancomycin (VANCOCIN) IVPB 1000 mg/200 mL premix        1,000 mg 200 mL/hr over 60 Minutes Intravenous  Once 04/17/11 2200 04/17/11 2315   04/17/11 2200   piperacillin-tazobactam (ZOSYN) IVPB 3.375 g        3.375 g 12.5 mL/hr over 240 Minutes Intravenous  Once 04/17/11 2200 04/17/11 2313          Assessment: Results of MRI noted: Probably early osteo. Vancomycin trough = 10.4 mcg/ml.  For Osteo, goal for Vancomycin should be 15-20 mcg/ml.  Goal of Therapy:  Vancomycin trough level 15-20 mcg/ml  Plan:  Scr 1.2<---0.6, concern for sudden decrease in renal function, will decrease vancomycin to 1500 mg q12h and f/u renal function tomorrow Continue Zosyn for now, same schedule F/u plan to change to po antibiotics   Candi Profit, [place] R, Pharm.D. 04/22/2011 11:53 AM

## 2011-04-22 NOTE — Progress Notes (Signed)
Physical Therapy Evaluation Patient Details Name: Steven [name] MRN: [id number] DOB: 13-Aug-1960 Today's Date: 04/22/2011  Problem List:  Patient Active Problem List  Diagnoses  . ONYCHOMYCOSIS, TOENAILS  . DM  . VENOUS INSUFFICIENCY  . CALLUSES, FEET, BILATERAL  . HX, PERSONAL, HEALTH HAZARDS NEC  . Diabetic foot ulcers  . HTN (hypertension)  . Rib pain  . Shoulder pain  . ETOH abuse    Past Medical History:  Past Medical History  Diagnosis Date  . DM (diabetes mellitus)   . HTN (hypertension)   . History of DVT (deep vein thrombosis)   . Personal history of diabetic foot ulcer   . Peripheral vascular disease    Past Surgical History: History reviewed. No pertinent past surgical history.  PT Assessment/Plan/Recommendation PT Assessment Clinical Impression Statement: Patient s/p 2nd ray amputation right foot.  Pain limiting progress.  Patient has some safety awareness - stressed need to ask for assistance due to decreased balance at this time.   PT Recommendation/Assessment: Patient will need skilled PT in the acute care venue PT Problem List: Decreased strength;Decreased balance;Decreased mobility;Decreased knowledge of use of DME;Decreased safety awareness;Pain PT Therapy Diagnosis : Difficulty walking;Acute pain PT Plan PT Frequency: Min 4X/week PT Treatment/Interventions: DME instruction;Gait training;Functional mobility training;Therapeutic exercise;Cognitive remediation;Patient/family education PT Recommendation Follow Up Recommendations: Home health PT;Other (comment) (Recommend HH Aide for ADL's) Equipment Recommended: Rolling walker with 5" wheels PT Goals  Acute Rehab PT Goals PT Goal Formulation: With patient Time For Goal Achievement: 7 days Pt will go Supine/Side to Sit: Independently;with HOB 0 degrees PT Goal: Supine/Side to Sit - Progress: Not met Pt will go Sit to Supine/Side: Independently;with HOB 0 degrees PT Goal: Sit to Supine/Side - Progress:  Not met Pt will Transfer Sit to Stand/Stand to Sit: with modified independence;with upper extremity assist PT Transfer Goal: Sit to Stand/Stand to Sit - Progress: Not met Pt will Transfer Bed to Chair/Chair to Bed: with modified independence PT Transfer Goal: Bed to Chair/Chair to Bed - Progress: Not met Pt will Ambulate: 51 - 150 feet;with modified independence;with rolling walker PT Goal: Ambulate - Progress: Not met  PT Evaluation Precautions/Restrictions  Precautions Precautions: Fall Precaution Comments: Patient very impulsive with decreased safety awareness. Required Braces or Orthoses: Yes Other Brace/Splint: Post-op shoe right foot Restrictions Weight Bearing Restrictions: No Prior Functioning  Home Living Lives With: Alone Receives Help From: Family (Sister helps with getting groceries, etc.) Type of Home: Apartment Home Layout: One level Home Access: Level entry Bathroom Shower/Tub: Engineer, manufacturing systems: Standard Bathroom Accessibility: Yes How Accessible: Accessible via wheelchair Home Adaptive Equipment: Grab bars in shower;Wheelchair - powered Prior Function Level of Independence: Independent with basic ADLs;Independent with homemaking with ambulation;Independent with gait Driving: No Cognition Cognition Arousal/Alertness: Awake/alert Overall Cognitive Status: Impaired Attention: Impaired Current Attention Level: Selective Attention - Other Comments: Easily distracted.  Cues to redirect to task Orientation Level: Oriented X4 Following Commands: Follows one step commands consistently Safety/Judgement: Decreased awareness of safety precautions;Decreased safety judgement for tasks assessed Decreased Safety/Judgement: Impulsive;Decreased awareness of need for assistance Safety/Judgement - Other Comments: Instructed patient multiple times to ask for assistance to get out of bed.   Problem Solving: Requires assistance for problem solving Cognition -  Other Comments: Patient leaning out of bed to reach urinal on floor.  Bed elevated for foot.  Instructed patient to ask for assistance or place urinal where he can safely reach it. Sensation/Coordination Sensation Light Touch: Appears Intact Coordination Gross Motor Movements are Fluid and  Coordinated: Yes Extremity Assessment RUE Assessment RUE Assessment: Within Functional Limits LUE Assessment LUE Assessment: Within Functional Limits RLE Assessment RLE Assessment: Exceptions to [facility] RLE AROM (degrees) Overall AROM Right Lower Extremity: Within functional limits for tasks assessed;Other (Comment) (Unable to test ankle movement due to bandaging) Right Ankle Dorsiflexion 0-20:  (Unable to test DF/PF due to bandaging/surgery) RLE Strength RLE Overall Strength: Within Functional Limits for tasks assessed Right Ankle Dorsiflexion:  (Unable to test ankle DF/PF due to bandaging/surgery) LLE Assessment LLE Assessment: Within Functional Limits Mobility (including Balance) Bed Mobility Bed Mobility: Yes Supine to Sit: 5: Supervision;With rails;HOB elevated (Comment degrees) (HOB at 30 degrees) Supine to Sit Details (indicate cue type and reason): Cues for safe transition Sitting - Scoot to Edge of Bed: 5: Supervision;With rail Sit to Supine - Right: 5: Supervision;With rail;HOB elevated (comment degrees) (HOB at 30 degrees) Sit to Supine - Right Details (indicate cue type and reason): Cues for safety. Transfers Transfers: Yes Sit to Stand: 4: Min assist;With upper extremity assist;From bed Sit to Stand Details (indicate cue type and reason): Multiple cues for safety for proper hand placement and safe transition.  Assist for balance Stand to Sit: 4: Min assist;With upper extremity assist;To bed Stand to Sit Details: Cues to reach back for bed before sitting.  Assist for balance. Ambulation/Gait Ambulation/Gait: Yes Ambulation/Gait Assistance: 4: Min assist Ambulation/Gait Assistance  Details (indicate cue type and reason): Verbal and tactile cues for safe placement/use of RW.  Cues for gait sequence.  Verbal cues to hold head up and stand upright.  Patient with decreased balance, losing balance x3 during gait, particularly during turns. Ambulation Distance (Feet): 96 Feet Assistive device: Rolling walker Gait Pattern: Step-through pattern;Antalgic;Trunk flexed Gait velocity: Slow gait speed Stairs: No  Posture/Postural Control Posture/Postural Control: Postural limitations Postural Limitations: Patient with flexed posture in standing and with gait.  Head looking down at floor.  Cues to stand upright and look forward. Balance Balance Assessed: No   End of Session PT - End of Session Equipment Utilized During Treatment: Gait belt Activity Tolerance: Patient tolerated treatment well;Patient limited by pain Patient left: in bed;with call bell in reach (With RLE elevated) Nurse Communication: Mobility status for ambulation General Behavior During Session: [facility] for tasks performed Cognition: Impaired Cognitive Impairment: Patient with decreased safety awareness during session.  Vena Austria  [phone] 04/22/2011, 4:18 PM

## 2011-04-22 NOTE — Progress Notes (Signed)
PGY-1 Daily Progress Note Family Medicine Teaching Service Amber M. Hairford, MD Service Pager: [phone]  Subjective: Patient had surgery of RLE yesterday with Dr. Lajoyce Corners. Patient doing well this morning, no complaints. No acute events overnight.  Objective: Vital signs in last 24 hours: Temp:  [97.2 F (36.2 C)-98.9 F (37.2 C)] 97.4 F (36.3 C) (11/10 0620) Pulse Rate:  [69-76] 69  (11/10 0620) Resp:  [16-22] 19  (11/10 0620) BP: (105-116)/(69-74) 112/70 mmHg (11/10 0620) SpO2:  [94 %-100 %] 99 % (11/10 0620) Weight change:  Last BM Date: 04/20/11  Intake/Output from previous day: 11/09 0701 - 11/10 0700 In: 1680 [P.O.:480; I.V.:700; IV Piggyback:500] Out: 1400 [Urine:1400] Intake/Output this shift: Total I/O In: 175 [P.O.:175] Out: 500 [Urine:500]  Physical Exam:  General: AAO x2, NAD HEENT: Atraumatic, normocephalic Cardio: RRR, no MRG Pulm: Expiratory wheeze right lung base.  Abd: Soft, nontender, +BS Extremities: Large dressing in place right foot. Warmth of right LE to mid calf with tenderness. Sensation intact Neuro: Grossly intact  Lab Results:  Basename 04/22/11 0700 04/21/11 0520  WBC 11.8* 11.6*  HGB 13.5 14.4  HCT 40.0 41.6  PLT 286 286   BMET  Basename 04/22/11 0700 04/21/11 0520  NA 136 134*  K 3.9 4.2  CL 99 96  CO2 28 29  GLUCOSE 214* 223*  BUN 12 7  CREATININE 1.21 0.58  CALCIUM 10.2 10.5   CBG (last 3)   Basename 04/22/11 0612 04/21/11 2201 04/21/11 1607  GLUCAP 217* 200* 198*    Studies/Results: No results found. 04/18/2011 Lower extremity Arterial Doppler Bilaterally Summary: Normal ABI's and pedal waveforms at rest.  Medications:  I have reviewed the patient's current medications. Scheduled:    . heparin  5,000 Units Subcutaneous Q8H  . insulin aspart  0-9 Units Subcutaneous TID WC  . insulin glargine  10 Units Subcutaneous Q1200  . lisinopril  10 mg Oral Daily  . patient's guide to using coumadin book   Does not apply  Once  . piperacillin-tazobactam (ZOSYN)  IV  3.375 g Intravenous Q8H  . polyethylene glycol  17 g Oral Daily  . vancomycin  1,500 mg Intravenous Q8H  . vancomycin  500 mg Intravenous NOW  . warfarin  7.5 mg Oral Once  . warfarin   Does not apply Once  . DISCONTD: insulin aspart  0-15 Units Subcutaneous TID WC   Continuous:    . sodium chloride    . lactated ringers 125 mL/hr at 04/21/11 1030   JXB:[id number], ibuprofen, methocarbamol(ROBAXIN) IV, methocarbamol, metoCLOPramide (REGLAN) injection, metoCLOPramide, morphine, ondansetron (ZOFRAN) IV, ondansetron, oxyCODONE-acetaminophen, senna-docusate, DISCONTD: HYDROcodone-acetaminophen, DISCONTD: HYDROmorphone, DISCONTD: HYDROmorphone, DISCONTD: meperidine, DISCONTD: promethazine Anti-infectives     Start     Dose/Rate Route Frequency Ordered Stop   04/21/11 0800   ceFAZolin (ANCEF) IVPB 1 g/50 mL premix        1 g 100 mL/hr over 30 Minutes Intravenous 60 min pre-op 04/20/11 1545 04/21/11 0142   04/20/11 2200   vancomycin (VANCOCIN) 1,500 mg in sodium chloride 0.9 % 500 mL IVPB        1,500 mg 250 mL/hr over 120 Minutes Intravenous Every 8 hours 04/20/11 1429     04/20/11 1538   ceFAZolin (ANCEF) IVPB 1 g/50 mL premix  Status:  Discontinued        1 g 100 mL/hr over 30 Minutes Intravenous 60 min pre-op 04/20/11 1538 04/20/11 1545   04/20/11 1430   vancomycin (VANCOCIN) 500 mg in sodium chloride 0.9 % 100  mL IVPB        500 mg 100 mL/hr over 60 Minutes Intravenous NOW 04/20/11 1425 04/21/11 1430   04/18/11 0300  piperacillin-tazobactam (ZOSYN) IVPB 3.375 g       3.375 g 12.5 mL/hr over 240 Minutes Intravenous 3 times per day 04/18/11 0239     04/18/11 0300   vancomycin (VANCOCIN) IVPB 1000 mg/200 mL premix  Status:  Discontinued        1,000 mg 200 mL/hr over 60 Minutes Intravenous Every 8 hours 04/18/11 0240 04/20/11 1429   04/17/11 2200   vancomycin (VANCOCIN) IVPB 1000 mg/200 mL premix        1,000 mg 200 mL/hr over  60 Minutes Intravenous  Once 04/17/11 2200 04/17/11 2315   04/17/11 2200  piperacillin-tazobactam (ZOSYN) IVPB 3.375 g       3.375 g 12.5 mL/hr over 240 Minutes Intravenous  Once 04/17/11 2200 04/17/11 2313          Assessment/Plan: 50 yo male with PMH of uncontrolled DM, chronic foot ulcer, HTN, tobacco use and alcohol use presenting with 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  1. Foot wound- Chronic foot ulcer p/w infection. On admission, Xray did not show osteo but MRI on 11/7 did show osteo and soft tissue swelling/abscess. Arterial dopplers were wnl. Wound care and Ortho consulted. Patient had surgery yesterday with Dr. 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 Patient is on day #5 of Vanc and Zosyn. Will transition to PO Clinda and Cipro today (for 4 weeks) and continue to monitor. Will give Percocet prn for pain. PT/OT consults pending but patient will need to be completely not weightbearing at this time, per Dr. Lajoyce Corners.  He will also need home health for PT and wound care. Will write a specific order as requested by case management after wound care sees the patient. Continue to monitor. Patient will have follow-up appointment with Dr. Lajoyce Corners in one week.   2. DM- Uncontrolled. On Metformin as an outpatient. Admitted with CBG's around 300 and started on SSI. Sensitivity changed to moderate and still required large doses of SSI. Lantus 10U started yesterday. Continue Lantus and moderate SSI. We appreciate DM coordinator's help on this patient. Continue to monitor CBG  3. HTN- On home dose of Lisinopril 10mg . Blood pressure stable. Continue to monitor.  4. Tobacco use- 1/2 ppd smoker. He does not state any desire to quit. He was offered Nicotine patches which he has refused.  5. Alcohol use- On CIWA protocol, but has not had any s/sx of withdrawal since admission  6. Anticoagulation- Patient was started on  Coumadin post-op as part of an order set. He does not require Coumadin, and this was confirmed with Dr. Lajoyce Corners. Will d/c this order.  7. Wheezing- Patient had new wheezing on exam this morning. Patient is post-op and is an everyday smoker, therefore we will get a CXR to evaluate this change in lung exam. Patient has no complaints of cough or shortness of breath at this time, spO2 99% on RA. Continue to monitor.  6. Social- Theresia Bough CSW saw patient discuss post-operative plans. Ortho states he will be stable for discharge following operation but he will need to be non-weightbearing which may require him to have assistance. Case management contacted. Will arrang HHPT, and we are awaiting wound care to reassess him for specific orders for [facility].  7. PPx- Heparin SQ (D/C Coumadin)  8. FEN/GI- Carb modified diet. IVF to [facility].   9. Dispo- Pending patient does well post-op as well as overall clinical improvement. Most likely will be discharged in next 24-48 hours, if everything goes well.  Code: Full PCP: Ashley Royalty  LOS: 5 days   HAIRFORD, AMBER 04/22/2011, 9:42 AM

## 2011-04-22 NOTE — Progress Notes (Signed)
Seen and examined.  Agree with Dr. Algis Downs assess plan and management.

## 2011-04-23 ENCOUNTER — Inpatient Hospital Stay (HOSPITAL_COMMUNITY): Payer: Medicaid Other

## 2011-04-23 LAB — CBC
HCT: 39.1 % (ref 39.0–52.0)
HCT: 39.7 % (ref 39.0–52.0)
Hemoglobin: 12.7 g/dL — ABNORMAL LOW (ref 13.0–17.0)
MCH: 28.8 pg (ref 26.0–34.0)
MCH: 29.6 pg (ref 26.0–34.0)
MCHC: 32.5 g/dL (ref 30.0–36.0)
MCHC: 33.5 g/dL (ref 30.0–36.0)
MCV: 88.2 fL (ref 78.0–100.0)
RDW: 13.2 % (ref 11.5–15.5)
WBC: 10.4 10*3/uL (ref 4.0–10.5)

## 2011-04-23 LAB — BASIC METABOLIC PANEL
BUN: 14 mg/dL (ref 6–23)
Creatinine, Ser: 1.39 mg/dL — ABNORMAL HIGH (ref 0.50–1.35)
GFR calc non Af Amer: 57 mL/min — ABNORMAL LOW (ref >90)
Glucose, Bld: 117 mg/dL — ABNORMAL HIGH (ref 70–99)
Potassium: 3.8 mEq/L (ref 3.5–5.1)

## 2011-04-23 LAB — GLUCOSE, CAPILLARY
Glucose-Capillary: 134 mg/dL — ABNORMAL HIGH (ref 70–99)
Glucose-Capillary: 269 mg/dL — ABNORMAL HIGH (ref 70–99)
Glucose-Capillary: 195 mg/dL — ABNORMAL HIGH (ref 70–99)
Glucose-Capillary: 153 mg/dL — ABNORMAL HIGH (ref 70–99)

## 2011-04-23 NOTE — Progress Notes (Signed)
  Subjective:    Patient ID: Steven [name], male    DOB: 01-24-1961, 50 y.o.   MRN: [id number]  HPI Steven [name] feels good.  Pain is controled.  Has worked with PT and uses a walker successfully.  He lives alone.  I would like to aim for DC tomorrow.  He will need home health for dressing changes.  We will need to clarify with ortho about wound management.  He may also need home health PT.  Will need prolonged PO antibiotics and per surg may need further debridement.     Review of Systems     Objective:   Physical Exam        Assessment & Plan:

## 2011-04-23 NOTE — Progress Notes (Signed)
Seen and examined earlier.  See my separate progress note.  Agree with Dr. Edmonia James' management.

## 2011-04-23 NOTE — Progress Notes (Addendum)
PGY-1 Daily Progress Note Family Medicine Teaching Service Steven M. Hairford, MD Service Pager: [phone]  Subjective: Patient had surgery of RLE  with Dr. Lajoyce Corners this hospitalization. Patient doing well this morning, no complaints. No acute events overnight. Working with Pt.  Objective: Vital signs in last 24 hours: Temp:  [98 F (36.7 C)-98.7 F (37.1 C)] 98 F (36.7 C) (11/11 0542) Pulse Rate:  [60-78] 67  (11/11 0542) Resp:  [18-20] 18  (11/11 0542) BP: (120-153)/(78-81) 153/81 mmHg (11/11 0542) SpO2:  [95 %-100 %] 98 % (11/11 0542) Weight change:  Last BM Date: 04/20/11  Intake/Output from previous day: 11/10 0701 - 11/11 0700 In: 1225 [P.O.:1225] Out: 2425 [Urine:2425] Intake/Output this shift: Total I/O In: 480 [P.O.:480] Out: -   Physical Exam:  General: AAO x2, NAD HEENT: Atraumatic, normocephalic Cardio: RRR, no MRG Pulm:CTA bilateral. No wheezing  Abd: Soft, nontender, +BS Extremities: Large dressing in place right foot.  Neuro: Grossly intact  Lab Results:  Basename 04/23/11 0500 04/22/11 0700  WBC 11.0* 11.8*  HGB 12.7* 13.5  HCT 39.1 40.0  PLT 320 286   BMET  Basename 04/23/11 0500 04/22/11 0700  NA 138 136  K 3.8 3.9  CL 98 99  CO2 27 28  GLUCOSE 117* 214*  BUN 14 12  CREATININE 1.39* 1.21  CALCIUM 10.5 10.2   CBG (last 3)   Basename 04/23/11 0755 04/23/11 0611 04/22/11 2144  GLUCAP 152* 134* 109*    Studies/Results: Dg Chest 2 View  04/23/2011  *RADIOLOGY REPORT*  Clinical Data: Postop, shortness of breath  CHEST - 2 VIEW  Comparison: 06/06/2009  Findings: Lungs are essentially clear. No pleural effusion or pneumothorax.  Cardiomegaly.  Degenerative changes of the visualized thoracolumbar spine.  IMPRESSION: No evidence of acute cardiopulmonary disease.  Cardiomegaly.  Original Report Authenticated By: Charline Bills, M.D.   04/18/2011 Lower extremity Arterial Doppler Bilaterally Summary: Normal ABI's and pedal waveforms at  rest.  Medications:  I have reviewed the patient's current medications. Scheduled:    . ciprofloxacin  750 mg Oral BID  . clindamycin  600 mg Oral Q8H  . heparin  5,000 Units Subcutaneous Q8H  . insulin aspart  0-15 Units Subcutaneous TID WC  . insulin glargine  10 Units Subcutaneous Q1200  . lisinopril  10 mg Oral Daily  . polyethylene glycol  17 g Oral BID  . DISCONTD: piperacillin-tazobactam (ZOSYN)  IV  3.375 g Intravenous Q8H  . DISCONTD: vancomycin  1,500 mg Intravenous Q8H  . DISCONTD: vancomycin  1,500 mg Intravenous Q12H     JYN:[id number], ibuprofen, methocarbamol(ROBAXIN) IV, methocarbamol, ondansetron, oxyCODONE-acetaminophen, senna-docusate, DISCONTD: morphine Anti-infectives     Start     Dose/Rate Route Frequency Ordered Stop   04/22/11 1800   vancomycin (VANCOCIN) 1,500 mg in sodium chloride 0.9 % 500 mL IVPB  Status:  Discontinued        1,500 mg 250 mL/hr over 120 Minutes Intravenous Every 12 hours 04/22/11 1152 04/22/11 1350   04/22/11 1500   clindamycin (CLEOCIN) capsule 600 mg        600 mg Oral 3 times per day 04/22/11 1353     04/22/11 1500   ciprofloxacin (CIPRO) tablet 750 mg        750 mg Oral 2 times daily 04/22/11 1353     04/21/11 0800   ceFAZolin (ANCEF) IVPB 1 g/50 mL premix        1 g 100 mL/hr over 30 Minutes Intravenous 60 min pre-op 04/20/11 1545 04/21/11  0142   04/20/11 2200   vancomycin (VANCOCIN) 1,500 mg in sodium chloride 0.9 % 500 mL IVPB  Status:  Discontinued        1,500 mg 250 mL/hr over 120 Minutes Intravenous Every 8 hours 04/20/11 1429 04/22/11 1153   04/20/11 1538   ceFAZolin (ANCEF) IVPB 1 g/50 mL premix  Status:  Discontinued        1 g 100 mL/hr over 30 Minutes Intravenous 60 min pre-op 04/20/11 1538 04/20/11 1545   04/20/11 1430   vancomycin (VANCOCIN) 500 mg in sodium chloride 0.9 % 100 mL IVPB        500 mg 100 mL/hr over 60 Minutes Intravenous NOW 04/20/11 1425 04/21/11 1430   04/18/11 0300    piperacillin-tazobactam (ZOSYN) IVPB 3.375 g  Status:  Discontinued        3.375 g 12.5 mL/hr over 240 Minutes Intravenous 3 times per day 04/18/11 0239 04/22/11 1350   04/18/11 0300   vancomycin (VANCOCIN) IVPB 1000 mg/200 mL premix  Status:  Discontinued        1,000 mg 200 mL/hr over 60 Minutes Intravenous Every 8 hours 04/18/11 0240 04/20/11 1429   04/17/11 2200   vancomycin (VANCOCIN) IVPB 1000 mg/200 mL premix        1,000 mg 200 mL/hr over 60 Minutes Intravenous  Once 04/17/11 2200 04/17/11 2315   04/17/11 2200  piperacillin-tazobactam (ZOSYN) IVPB 3.375 g       3.375 g 12.5 mL/hr over 240 Minutes Intravenous  Once 04/17/11 2200 04/17/11 2313          Assessment/Plan: 50 yo male with PMH of uncontrolled DM, chronic foot ulcer, HTN, tobacco use and alcohol use presenting with 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  1. Foot wound- Chronic foot ulcer p/w infection. On admission, Xray did not show osteo but MRI on 11/7 did show osteo and soft tissue swelling/abscess. Arterial dopplers were wnl. Wound care and Ortho consulted. Patient had surgery with Dr. 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 Patient is on day #5 of Vanc and Zosyn. Transitioned  to PO Clinda and Cipro on 11/10/12and this it to be continued for a total of 4 weeks. Will give Percocet prn for pain. PT/OT consults pending but patient will need to be completely not weightbearing at this time, per Dr. Lajoyce Corners.  Per surgery, may need further debridement.  Would like ortho to clarify what they would like for wound management as we try to arrange home health care.  He will also need home health for PT and wound care/dressing changes. Will write a specific order as requested by case management after wound care sees the patient. Continue to monitor. Patient will have follow-up appointment with Dr. Lajoyce Corners in one week- need to  schedule this tomorrow am when his office is open.   2. DM- Uncontrolled. But improving blood sugars. On Metformin as an outpatient. Admitted with CBG's around 300 and started on SSI. Sensitivity changed to moderate and still required large doses of SSI. Lantus 10U started on 04/21/11. Will Continue Lantus and moderate SSI. Pt has required less SSI during past couple of days.  Most likely this is due to treatment of infection. Will monitor over the next 24 hours and in the AM will determine what insulin regimen we will send him home with.  Pt will need close followup as outpatient to continue to adjust insulin  in the setting of healing/improving infection.   Continue to monitor CBG  3. HTN-  Blood pressure stable. Continue to monitor. Increase in creatinine during past 2 days will hold aceinhibitor.   4. Elevated Creatinine- On admission creatinine 0.58, increased yesterday to 1.21 and today to 1.39.  Pt no longer has IV.  Transitioned to po antibiotic yesterday.  No elevation in BUN.  Will hold aceinhibitor for now.  Will obtain FeNA. Pt to drink lots of po fluids today.  Recheck bmet in am.   5. Tobacco use- 1/2 ppd smoker. He does not state any desire to quit. He was offered Nicotine patches which he has refused.  6. Alcohol use- On CIWA protocol, but has not had any s/sx of withdrawal since admission  7. Anticoagulation- Patient was started on Coumadin post-op as part of an order set. He does not require Coumadin, and this was confirmed with Dr. Lajoyce Corners. Will d/c this order.  8. Wheezing- Patient had new wheezing on exam this morning on 11/10. Patient is post-op and is an everyday smoker, therefore we obtained a CXR to evaluate this change in lung exam. CXR- wNL.  Exam this am wnl. Patient has no complaints of cough or shortness of breath at this time, spO2 99% on RA. Continue to monitor.  6. Social- Theresia Bough CSW saw patient discuss post-operative plans. Ortho states he will be stable for  discharge following operation but he will need to be non-weightbearing which may require him to have assistance. Case management contacted. Will arrang HHPT, and we are awaiting wound care to reassess him for specific orders for [facility].   7. PPx- Heparin SQ (D/C Coumadin)  8. FEN/GI- Carb modified diet. IVF to [facility].   9. Dispo- Pending continued clinical improvement. Most likely will be discharged in the morning.  Code: Full PCP: Ashley Royalty  LOS: 6 days   Steven [name] 04/23/2011, 10:59 AM

## 2011-04-23 NOTE — ED Provider Notes (Signed)
Medical screening examination/treatment/procedure(s) were performed by non-physician practitioner and as supervising physician I was immediately available for consultation/collaboration.  Hanley Seamen, MD 04/23/11 1109

## 2011-04-24 LAB — BASIC METABOLIC PANEL
Chloride: 102 mEq/L (ref 96–112)
Creatinine, Ser: 1.46 mg/dL — ABNORMAL HIGH (ref 0.50–1.35)
GFR calc Af Amer: 62 mL/min — ABNORMAL LOW (ref >90)
Sodium: 139 mEq/L (ref 135–145)

## 2011-04-24 LAB — GLUCOSE, CAPILLARY: Glucose-Capillary: 174 mg/dL — ABNORMAL HIGH (ref 70–99)

## 2011-04-24 MED ORDER — SODIUM CHLORIDE 0.9 % IV SOLN
INTRAVENOUS | Status: DC
  Administered 2011-04-24 – 2011-04-25 (×3): via INTRAVENOUS

## 2011-04-24 NOTE — Progress Notes (Signed)
PGY-1 Daily Progress Note Family Medicine Teaching Service Sydnie Sigmund M. Brigette Hopfer, MD Service Pager: [phone]  Subjective: No acute events overnight. Pt is not reporting any pain at this time.   Objective: Vital signs in last 24 hours: Temp:  [97.9 F (36.6 C)-98.8 F (37.1 C)] 98.6 F (37 C) (11/12 0515) Pulse Rate:  [64-80] 80  (11/12 0515) Resp:  [18-20] 18  (11/12 0515) BP: (139-182)/(80-102) 182/102 mmHg (11/12 0515) SpO2:  [93 %-98 %] 93 % (11/12 0515) Weight change:  Last BM Date: 04/20/11  Intake/Output from previous day: 11/11 0701 - 11/12 0700 In: 960 [P.O.:960] Out: 1800 [Urine:1800] Intake/Output this shift: Total I/O In: 360 [P.O.:360] Out: 1200 [Urine:1200]  Physical Exam:  General: Sitting in bed. AAO x3, NAD HEENT: Atraumatic, normocephalic. Pupils equal and reactive to light.  Cardio: RRR, no MRG Pulm: Expiratory wheeze right lung base.  Abd: Soft, nontender, +BS Extremities: Large dressing in place right foot. Warmth of right LE to mid calf with tenderness. Sensation intact Neuro: Grossly intact  Lab Results:  Basename 04/23/11 1200 04/23/11 0500  WBC 10.4 11.0*  HGB 13.3 12.7*  HCT 39.7 39.1  PLT 303 320   BMET  Basename 04/24/11 0525 04/23/11 0500  NA 139 138  K 4.0 3.8  CL 102 98  CO2 28 27  GLUCOSE 149* 117*  BUN 14 14  CREATININE 1.46* 1.39*  CALCIUM 10.7* 10.5   CBG (last 3)   Basename 04/24/11 1119 04/24/11 0707 04/23/11 2152  GLUCAP 177* 147* 153*    Studies/Results: Dg Chest 2 View  04/23/2011  *RADIOLOGY REPORT*  Clinical Data: Postop, shortness of breath  CHEST - 2 VIEW  Comparison: 06/06/2009  Findings: Lungs are essentially clear. No pleural effusion or pneumothorax.  Cardiomegaly.  Degenerative changes of the visualized thoracolumbar spine.  IMPRESSION: No evidence of acute cardiopulmonary disease.  Cardiomegaly.  Original Report Authenticated By: Charline Bills, M.D.   04/18/2011 Lower extremity Arterial Doppler  Bilaterally Summary: Normal ABI's and pedal waveforms at rest.  Medications:  I have reviewed the patient's current medications. Scheduled:    . ciprofloxacin  750 mg Oral BID  . clindamycin  600 mg Oral Q8H  . heparin  5,000 Units Subcutaneous Q8H  . insulin aspart  0-15 Units Subcutaneous TID WC  . insulin glargine  10 Units Subcutaneous Q1200  . polyethylene glycol  17 g Oral BID   Continuous:    . sodium chloride 125 mL/hr at 04/24/11 1429  . DISCONTD: sodium chloride     UVO:[id number], ibuprofen, methocarbamol(ROBAXIN) IV, methocarbamol, ondansetron, oxyCODONE-acetaminophen, senna-docusate Anti-infectives     Start     Dose/Rate Route Frequency Ordered Stop   04/22/11 1800   vancomycin (VANCOCIN) 1,500 mg in sodium chloride 0.9 % 500 mL IVPB  Status:  Discontinued        1,500 mg 250 mL/hr over 120 Minutes Intravenous Every 12 hours 04/22/11 1152 04/22/11 1350   04/22/11 1500   clindamycin (CLEOCIN) capsule 600 mg        600 mg Oral 3 times per day 04/22/11 1353     04/22/11 1500   ciprofloxacin (CIPRO) tablet 750 mg        750 mg Oral 2 times daily 04/22/11 1353     04/21/11 0800   ceFAZolin (ANCEF) IVPB 1 g/50 mL premix        1 g 100 mL/hr over 30 Minutes Intravenous 60 min pre-op 04/20/11 1545 04/21/11 0142   04/20/11 2200   vancomycin (VANCOCIN) 1,500  mg in sodium chloride 0.9 % 500 mL IVPB  Status:  Discontinued        1,500 mg 250 mL/hr over 120 Minutes Intravenous Every 8 hours 04/20/11 1429 04/22/11 1153   04/20/11 1538   ceFAZolin (ANCEF) IVPB 1 g/50 mL premix  Status:  Discontinued        1 g 100 mL/hr over 30 Minutes Intravenous 60 min pre-op 04/20/11 1538 04/20/11 1545   04/20/11 1430   vancomycin (VANCOCIN) 500 mg in sodium chloride 0.9 % 100 mL IVPB        500 mg 100 mL/hr over 60 Minutes Intravenous NOW 04/20/11 1425 04/21/11 1430   04/18/11 0300   piperacillin-tazobactam (ZOSYN) IVPB 3.375 g  Status:  Discontinued        3.375 g 12.5  mL/hr over 240 Minutes Intravenous 3 times per day 04/18/11 0239 04/22/11 1350   04/18/11 0300   vancomycin (VANCOCIN) IVPB 1000 mg/200 mL premix  Status:  Discontinued        1,000 mg 200 mL/hr over 60 Minutes Intravenous Every 8 hours 04/18/11 0240 04/20/11 1429   04/17/11 2200   vancomycin (VANCOCIN) IVPB 1000 mg/200 mL premix        1,000 mg 200 mL/hr over 60 Minutes Intravenous  Once 04/17/11 2200 04/17/11 2315   04/17/11 2200  piperacillin-tazobactam (ZOSYN) IVPB 3.375 g       3.375 g 12.5 mL/hr over 240 Minutes Intravenous  Once 04/17/11 2200 04/17/11 2313          Assessment/Plan: 50 yo male with PMH of uncontrolled DM, chronic foot ulcer, HTN, tobacco use and alcohol use presenting with 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  1. Foot wound- Chronic foot ulcer p/w infection. On admission, Xray did not show osteo but MRI on 11/7 did show osteo and soft tissue swelling/abscess. Arterial dopplers were wnl. Wound care and Ortho consulted. Patient had surgery yesterday with Dr. 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 Patient completed 5 days of Vanc and Zosyn. Transition to PO Clinda and Cipro on 04/22/11, will continue for a total of 4 weeks. Will give Percocet prn for pain. PT/OT consults pending but patient will need to be completely not weightbearing at this time, per Dr. Lajoyce Corners.  He will go home with home health PT and wound care. Will write a specific order as requested by case management after wound care sees the patient. Continue to monitor. Patient will have follow-up appointment with Dr. Lajoyce Corners in one week.   2. DM- Uncontrolled. On Metformin as an outpatient. Admitted with CBG's around 300 and started on SSI. Sensitivity changed to moderate and still required large doses of SSI. Lantus 10U started yesterday. Continue Lantus and moderate SSI. We appreciate DM coordinator's help on this patient,  and would like for her to see him again to discuss outpatient insulin.   3. HTN- Elevated BP today. Could be secondary to AKI as well as stopping his ACE. Starting IVF. Continue to monitor.  4. Tobacco use- 1/2 ppd smoker. He does not state any desire to quit. He was offered Nicotine patches which he has refused.  5. Alcohol use- On CIWA protocol, but has not had any s/sx of withdrawal since admission  6. Acute Kidney injury- Pt's creatnine has been trending upwards since his operation. ACE-I currently being held. Will start IVF today and recheck Bmet in the morning.   7. Wheezing-  Noted on exam over the weekend. Improved. CXR unremarkable. Continue to monitor.  6. Social- Theresia Bough CSW following patient as well as case management. Patient will go home with HHPT and HHRN for wound care. ([facility] Continue to follow with Nelva Bush and Elnita Maxwell to make sure these resources are in place.   7. PPx- Heparin SQ (  8. FEN/GI- Carb modified diet. IVF NS at 125 cc/hr  9. Dispo- Pending improvement of renal function.  Code: Full PCP: Ashley Royalty  LOS: 7 days   Jasaun Carn 04/24/2011, 2:32 PM

## 2011-04-24 NOTE — Progress Notes (Signed)
Pt has selected [facility] for HHPT  And walker. Both ordered and still await final decision re wound care, await surgeon recommendation.  CRoyal RN MPH

## 2011-04-24 NOTE — Progress Notes (Signed)
Physical Therapy Treatment Patient Details Name: Steven [name] MRN: [id number] DOB: Oct 25, 1960 Today's Date: 04/24/2011  PT Assessment/Plan  PT - Assessment/Plan Comments on Treatment Session: Pt progressing well, still limited by impulsivity and decreased safety awareness. VC throughout session for safety and need for assistance when not in therapy.  PT Plan: Discharge plan remains appropriate PT Frequency: Min 4X/week Follow Up Recommendations: Home health PT Equipment Recommended: Rolling walker with 5" wheels PT Goals  Acute Rehab PT Goals PT Goal Formulation: With patient Time For Goal Achievement: 7 days PT Goal: Supine/Side to Sit - Progress: Progressing toward goal PT Goal: Sit to Supine/Side - Progress: Progressing toward goal PT Transfer Goal: Sit to Stand/Stand to Sit - Progress: Progressing toward goal PT Transfer Goal: Bed to Chair/Chair to Bed - Progress: Progressing toward goal PT Goal: Ambulate - Progress: Progressing toward goal  PT Treatment Precautions/Restrictions  Precautions Precautions: Fall Precaution Comments: Patient very impulsive with decreased safety awareness. Required Braces or Orthoses: Yes Other Brace/Splint: Post-op shoe right foot Restrictions Weight Bearing Restrictions: Yes RLE Weight Bearing: Non weight bearing Mobility (including Balance) Bed Mobility Bed Mobility: Yes Supine to Sit: 6: Modified independent (Device/Increase time);HOB elevated (Comment degrees) (30) Supine to Sit Details (indicate cue type and reason): Cues for impulsivity due to safety concerns Sitting - Scoot to Edge of Bed: 6: Modified independent (Device/Increase time) Transfers Transfers: Yes Sit to Stand: 5: Supervision;From bed Sit to Stand Details (indicate cue type and reason): VC for hand placement and for safety to RW Stand to Sit: 5: Supervision;To chair/3-in-1 Stand to Sit Details: VC for hand placement for safety to  recliner Ambulation/Gait Ambulation/Gait: Yes Ambulation/Gait Assistance: 5: Supervision Ambulation/Gait Assistance Details (indicate cue type and reason): VC for RW safety due to impulsivity. Slight LOB x 2 posteriorly. VC for sequencing with WB status.  Ambulation Distance (Feet): 150 Feet Assistive device: Rolling walker Gait Pattern: Trunk flexed;Step-to pattern Gait velocity: Decreased gait speed Stairs: No    Exercise    End of Session PT - End of Session Equipment Utilized During Treatment: Gait belt Activity Tolerance: Patient tolerated treatment well Patient left: in chair;with call bell in reach Nurse Communication: Mobility status for ambulation General Behavior During Session: [facility] for tasks performed Cognition: [facility] for tasks performed Cognitive Impairment: Patient with decreased safety awareness during session.  Milana Kidney 04/24/2011, 10:36 AM

## 2011-04-24 NOTE — Progress Notes (Signed)
Noted surgical note per Dr Lajoyce Corners, will ask pt RN to provide any materials for reinforcement of wound if needed. Home health not appropriate for reinforcement of non draining wound.  CRoyal RN MPH [phone]

## 2011-04-24 NOTE — Progress Notes (Signed)
I have seen and examined this patient. I have discussed the evaluation with Dr Jenelle Mages and the team.  I agree with their findings and plans as documented in their progress note for today.

## 2011-04-25 LAB — BASIC METABOLIC PANEL
BUN: 14 mg/dL (ref 6–23)
Creatinine, Ser: 1.4 mg/dL — ABNORMAL HIGH (ref 0.50–1.35)
GFR calc Af Amer: 65 mL/min — ABNORMAL LOW (ref >90)
GFR calc non Af Amer: 56 mL/min — ABNORMAL LOW (ref >90)
Potassium: 4.3 mEq/L (ref 3.5–5.1)

## 2011-04-25 LAB — CBC
HCT: 40.2 % (ref 39.0–52.0)
MCHC: 32.3 g/dL (ref 30.0–36.0)
MCV: 88.7 fL (ref 78.0–100.0)
RDW: 13.4 % (ref 11.5–15.5)

## 2011-04-25 LAB — GLUCOSE, CAPILLARY: Glucose-Capillary: 159 mg/dL — ABNORMAL HIGH (ref 70–99)

## 2011-04-25 MED ORDER — OXYCODONE-ACETAMINOPHEN 5-325 MG PO TABS: 1.0000 | ORAL_TABLET | ORAL | Status: AC | PRN

## 2011-04-25 MED ORDER — GLIPIZIDE 10 MG PO TABS: 5.0000 mg | ORAL_TABLET | Freq: Two times a day (BID) | ORAL | Status: DC

## 2011-04-25 MED ORDER — CIPROFLOXACIN HCL 750 MG PO TABS: 750.0000 mg | ORAL_TABLET | Freq: Two times a day (BID) | ORAL | Status: AC

## 2011-04-25 MED ORDER — CLINDAMYCIN HCL 300 MG PO CAPS: 600.0000 mg | ORAL_CAPSULE | Freq: Three times a day (TID) | ORAL | Status: AC

## 2011-04-25 NOTE — Progress Notes (Signed)
Clinical Social Worker reviewed pt. Chart and noted that pt. Has a history of substance abuse. Clinical Social Worker attempted to provide pt. With resources however pt. Was in the bathroom. Clinical Social Worker provided RN with a list to give to the pt. As pt. Is to dc today. Theresia Bough, MSW, Theresia Majors [phone]

## 2011-04-25 NOTE — Progress Notes (Signed)
Pt refused therapy today. He said that he has been up a lot today around the room and is going home later today.   04/25/2011 Milana Kidney DPT PAGER: [phone] OFFICE: [phone]

## 2011-04-25 NOTE — Progress Notes (Signed)
I have seen and examined this patient. I have discussed the evaluation with Dr Mikel Cella and the [facility] team.  I agree with their findings and plans as documented in the progress note for today. He states that he is ambulating well with the walker

## 2011-04-25 NOTE — Progress Notes (Addendum)
Inpatient Diabetes Program Recommendations  AACE/ADA: New Consensus Statement on Inpatient Glycemic Control (2009)  Target Ranges:  Prepandial:   less than 140 mg/dL      Peak postprandial:   less than 180 mg/dL (1-2 hours)      Critically ill patients:  140 - 180 mg/dL   Reason for Visit: consult per MD for discharge insulin recommendations  Inpatient Diabetes Program Recommendations Insulin - Basal: Would not recommend pt to be started on lantus at home.  Pt extremely hesitant about taking insulin at home.  Concern would be with compliance due to history with etoh abuse.    Oral Agents: Pt could try a DPP4 Inhibitor as Januvia or Tradjenta or the GLP -1 agonists as Byetta or Victoza which does not hold the potential for hypoglycemia.    Note: At follow-up, please consider Januvia/Tradjenta or Byetta/Victoza as noted above.

## 2011-04-25 NOTE — Progress Notes (Signed)
PGY-1 Daily Progress Note Family Medicine Teaching Service Amber M. Hairford, MD Service Pager: [phone]  Subjective: No acute events overnight. Patient is concerned that his dressing has not been changed. He is eating well. No complaints  Objective: Vital signs in last 24 hours: Temp:  [97.7 F (36.5 C)-98.6 F (37 C)] 98.3 F (36.8 C) (11/13 0640) Pulse Rate:  [53-68] 63  (11/13 0640) Resp:  [18-20] 18  (11/13 0640) BP: (159-166)/(82-92) 159/88 mmHg (11/13 0640) SpO2:  [94 %-97 %] 96 % (11/13 0640) Weight change:  Last BM Date: 04/24/11  Intake/Output from previous day: 11/12 0701 - 11/13 0700 In: 1080 [P.O.:1080] Out: 2300 [Urine:2300] Intake/Output this shift:  Total I/O In: 240 [P.O.:240] Out: -   Physical Exam:  General: Sitting in bed. AAO x3, NAD HEENT: Atraumatic, normocephalic. Pupils equal and reactive to light.  Cardio: RRR, no MRG Pulm: Clear to auscultation bilaterally. Abd: Soft, nontender, +BS Extremities: Large dressing in place right foot. Warmth of right LE to mid calf with tenderness. Sensation intact. Moves all extremities. Neuro: Grossly intact  Lab Results:  Basename 04/25/11 0655 04/23/11 1200  WBC 7.9 10.4  HGB 13.0 13.3  HCT 40.2 39.7  PLT 325 303   BMET  Basename 04/25/11 0655 04/24/11 0525  NA 136 139  K 4.3 4.0  CL 101 102  CO2 27 28  GLUCOSE 217* 149*  BUN 14 14  CREATININE 1.40* 1.46*  CALCIUM 10.0 10.7*   CBG (last 3)   Basename 04/25/11 0649 04/24/11 2128 04/24/11 1618  GLUCAP 220* 201* 174*    Studies/Results: No results found. 04/18/2011 Lower extremity Arterial Doppler Bilaterally Summary: Normal ABI's and pedal waveforms at rest.  Medications:  I have reviewed the patient's current medications. Scheduled:    . ciprofloxacin  750 mg Oral BID  . clindamycin  600 mg Oral Q8H  . heparin  5,000 Units Subcutaneous Q8H  . insulin aspart  0-15 Units Subcutaneous TID WC  . insulin glargine  10 Units  Subcutaneous Q1200  . polyethylene glycol  17 g Oral BID   Continuous:    . sodium chloride 125 mL/hr at 04/25/11 0654  . DISCONTD: sodium chloride     HKV:[id number], ibuprofen, methocarbamol(ROBAXIN) IV, methocarbamol, ondansetron, oxyCODONE-acetaminophen, senna-docusate  Assessment/Plan: 50 yo male with PMH of uncontrolled DM, chronic foot ulcer, HTN, tobacco use and alcohol use presenting with 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  1. Foot wound- Chronic foot ulcer p/w infection. On admission, Xray did not show osteo but MRI on 11/7 did show osteo and soft tissue swelling/abscess. Arterial dopplers were wnl. Wound care and Ortho consulted. Patient had surgery on 04/21/11 with Dr. 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 Patient completed 5 days of Vanc and Zosyn.   -Transition to PO Clinda and Cipro on 04/22/11, will continue for a total of 4 weeks.   -Will give Percocet prn for pain.  - PT/OT consults pending but patient will need to be completely not weightbearing at this time, per Dr. Lajoyce Corners.  He will go home with home health PT and wound care.   - Follow-up with Dr. Lajoyce Corners on 11/15 at 1:15pm  -Follow-up with Dr. Ashley Royalty on 11/19 at 2:00pm  2. DM- Uncontrolled. On Metformin as an outpatient. Admitted with CBG's around 300 and started on SSI. Sensitivity changed to moderate and still required large doses of SSI.   - Started on Lantus 10mg   daily  - Required 11U of SSI  - Patient states he is willing to start insulin as an outpatient. I will defer to Dr. Ashley Royalty for further recommendations on this  3. HTN- Elevated BP today. Currently holding ACE  - Consider starting another HTN agent  -Continue fluids for now  4. Tobacco use- 1/2 ppd smoker. He does not state any desire to quit. He was offered Nicotine patches which he has refused.  5. Alcohol use- On CIWA protocol, but has not had any  s/sx of withdrawal since admission  6. Acute Kidney injury- Pt's creatnine has been trending upwards since his operation. ACE-I currently being held.   -IVF started yesterday with improvement in Cr to 1.4 from 1.46. Continue to encourage PO intake.  7. Social- Theresia Bough CSW following patient as well as case management. Patient will go home with HHPT and HHRN for wound care. ([facility] Continue to follow with Nelva Bush and Elnita Maxwell to make sure these resources are in place.   8. PPx- Heparin SQ  9. FEN/GI- Carb modified diet. IVF NS at 125 cc/hr  10. Dispo- Pending improvement of renal function. Possible discharge in next 24-48 hours.  Code: Full PCP: Ashley Royalty  LOS: 8 days   HAIRFORD, AMBER 04/25/2011, 10:16 AM

## 2011-04-26 ENCOUNTER — Encounter (HOSPITAL_COMMUNITY): Payer: Self-pay | Admitting: Orthopedic Surgery

## 2011-04-26 NOTE — Discharge Summary (Signed)
Physician Discharge Summary  Patient ID: PIO EATHERLY MRN: [id number] DOB/AGE: 1961-02-15 50 y.o.  Admit date: 04/17/2011 Discharge date: 04/26/2011  Admission Diagnoses: Diabetic foot ulcer Diabetes, hypertension, acute infection  Discharge Diagnoses:  Principal Problem:  *Diabetic foot ulcers Active Problems:  DM  VENOUS INSUFFICIENCY  HTN (hypertension)  ETOH abuse   Discharged Condition: stable  Hospital Course: Patient is a 50 year old past medical history controlled diabetes, hypertension, diabetic foot ulcer, venous insufficiency, and alcohol abuse, who presented to the emergency department after being referred from wound care for a acute infection of a chronic right foot diabetic foot ulcer. 1. Diabetic foot ulcer: Patient has had ulcer on the plantar aspect of his right foot for over a year. He has been followed by Dr. Everrett Coombe at family practice has been encouraging patient to see wound care for this. Upon presentation to the emergency department, patient had an x-ray of the foot, which did not show osteomyelitis. On our exam in the emergency department, we were not able to probe to bone. Patient was started on vancomycin and Zosyn for antibiotic coverage. We consult wound care as well as orthopedics for further workup for his foot. Dr. Lajoyce Corners with orthopedics felt that his foot did have osteomyelitis but referred him for an MRI of his foot. MRI revealed osteomyelitis of his second toe, as well as a extensive plantar abscess. Dr. Lajoyce Corners, take the patient to surgery on 04/21/2011 for a second rotate dictation as well as debridement of the plantar abscess. Patient had a graft placed over the abscess was made nonweightbearing. He completed 24 hour course of IV antibiotics postop, then he was transitioned to by mouth ciprofloxacin and clindamycin for a total of 28 days. Patient worked well with physical therapy while in the hospital, he will have home PT to continue helping him  rehabilitation after this operation. Patient will followup with Dr. Lajoyce Corners: 04/27/2011 and he will also followup with Dr. Everrett Coombe at family practice on 05/01/2011. Patient did well with the procedure, and states some understanding of why this happened to him although his health literacy is low. This will be a chronic problem for him and will need to be addressed multiple times by his primary care physician. 2. Diabetes: Uncontrolled. Last A1c was greater than 9. Patient was on metformin at home, which he states he was taking. Patient part high doses of sliding scale insulin while in the hospital and was started on a basal Lantus dose is 10 units daily to help control his sugars while he was inpatient. At discharge, spoke with Dr. Ashley Royalty about a plan for him. For now. Patient will continue metformin as well as glipizide one twice daily with meals only. Patient was not started on insulin when he left the hospital. Patient will followup with Dr. Everrett Coombe, who will further discuss her regimen with him. Patient also states he needs a new common nerve, which can be provided by the outpatient clinic. 3. Hypertension- with known history of hypertension. Well-controlled during his hospital admission on lisinopril. She was restarted on this at discharge. 4. Acute Kidney Injury- postop patient had an increase in his creatinine above his baseline. Patient was restarted on IV fluids and lisinopril was held. At discharge his creatinine was trending down, but would recommend per Dr. Ashley Royalty to recheck a BUN and creatinine at his next office visit to make sure this is further resolving.  Consults: orthopedic surgery  Significant Diagnostic Studies:  BMET    Component Value Date/Time  NA 136 04/25/2011 0655   K 4.3 04/25/2011 0655   CL 101 04/25/2011 0655   CO2 27 04/25/2011 0655   GLUCOSE 217* 04/25/2011 0655   BUN 14 04/25/2011 0655   CREATININE 1.40* 04/25/2011 0655   CREATININE 0.74 12/22/2010 1107     CALCIUM 10.0 04/25/2011 0655   GFRNONAA 56* 04/25/2011 0655   GFRAA 65* 04/25/2011 0655   Dg Foot Complete Right  04/17/2011  *RADIOLOGY REPORT*  Clinical Data: Soft tissue wound under right foot.  RIGHT FOOT COMPLETE - 3+ VIEW  Comparison: None.  Findings: Soft tissue defect under the plantar are surface of the forefoot is noted.  No destructive bone lesion.  No evidence of aggressive periosteal reaction.  There is a fracture line with evidence of healing at the base of the distal phalanx of the great toe.  Bony framework is otherwise intact.  Degenerative changes are noted.  There is marked deformity of the distal fibula worrisome for post-traumatic changes.  IMPRESSION: No definite evidence of osteomyelitis.  Healing fracture involving the distal phalanx of the great toe.  Post-traumatic changes involving the distal fibula.  Original Report Authenticated By: Donavan Burnet, M.D.   Dg Chest 2 View  04/23/2011  *RADIOLOGY REPORT*  Clinical Data: Postop, shortness of breath  CHEST - 2 VIEW  Comparison: 06/06/2009  Findings: Lungs are essentially clear. No pleural effusion or pneumothorax.  Cardiomegaly.  Degenerative changes of the visualized thoracolumbar spine.  IMPRESSION: No evidence of acute cardiopulmonary disease.  Cardiomegaly.  Original Report Authenticated By: Charline Bills, M.D.   Mr Foot Right W Wo Contrast  04/19/2011  *RADIOLOGY REPORT*  Clinical Data: Nonhealing ulcer.  Question osteomyelitis.  MRI OF THE RIGHT FOREFOOT WITHOUT AND WITH CONTRAST  Technique:  Multiplanar, multisequence MR imaging was performed both before and after administration of intravenous contrast.  Contrast: 20mL MULTIHANCE GADOBENATE DIMEGLUMINE 529 MG/ML IV SOLN  Comparison: Radiographs 04/17/2011.  Findings: There is a large irregular complex fluid collection within the plantar aspect of the forefoot.  This lies deep to a skin ulcer plantar to the second metatarsal head.  This primarily lies superficial to  the plantar fascia, although extends more deeply in some areas and is associated with the flexor tendon sheaths.  Distally, there is extension of this fluid collection along the flexor tendons of the second ray into the plantar aspect of the second toe.  A component within the toe measures up to 1.8 cm in diameter.  The primary collection within the plantar aspect of the forefoot measures 2.5 x 4.2 x 7.4 cm and is associated with multiple foci of low T1 signal with susceptibility artifact, likely representing soft tissue emphysema.  There is a moderate effusion of the second metatarsal phalangeal joint with diffuse synovial enhancement following contrast.  There is also diffuse low-level marrow edema within the second metatarsal head and the second proximal phalanx.  Although gross cortical destruction is not identified, these osseous changes are suspicious for early osteomyelitis.  The other toes and metatarsals appear unremarkable.  There is diffuse forefoot soft tissue edema and enhancement consistent with cellulitis and/or myofasciitis.  No other focal fluid collections are identified.  IMPRESSION:  1.  Large complex abscess within the plantar aspect of the forefoot with distal extension along the flexor tendons into the second toe. There is associated soft tissue emphysema. 2.  Probable early osteomyelitis of the second metatarsal head and second proximal phalanx.  Suspected septic arthritis of the second metatarsal phalangeal joint. 3.  Diffuse forefoot cellulitis and probable myofasciitis.  These results were called by telephone on 04/19/2011  at  1335 hours to  the patient's nurse Lauren, who verbally acknowledged these results.  Original Report Authenticated By: Gerrianne Scale, M.D.    Treatments: IV hydration and antibiotics: vancomycin, Zosyn, Cipro and clinda  Discharge Exam: Blood pressure 165/84, pulse 58, temperature 98 F (36.7 C), temperature source Oral, resp. rate 18, height 6\' 4"  (1.93  m), weight 204 lb 14.4 oz (92.942 kg), SpO2 98.00%. Dressing intact on RLE. Erythema of RLE improved, still had venous stasis.  Disposition: Home or Self Care  Discharge Orders    Future Appointments: Provider: Department: Dept Phone: Center:   05/01/2011 2:15 PM Everrett Coombe [facility] [phone] [facility]     Future Orders Please Complete By Expires   Diet - low sodium heart healthy      Increase activity slowly      Discharge instructions      Comments:   Please keep your appointments with Dr. Lajoyce Corners on Friday and Dr. Ashley Royalty on Monday. It is very important! I am giving you a new medication for your blood sugar. ONLY take this medication if you eat. Also, take your antibiotic every day for the next 4 weeks.   Call MD for:  temperature >100.4      Call MD for:  persistant nausea and vomiting      Call MD for:  severe uncontrolled pain      Call MD for:  redness, tenderness, or signs of infection (pain, swelling, redness, odor or green/yellow discharge around incision site)      Call MD for:  difficulty breathing, headache or visual disturbances      Call MD for:  extreme fatigue        Discharge Medication List as of 04/25/2011  3:16 PM    START taking these medications   Details  ciprofloxacin (CIPRO) 750 MG tablet Take 1 tablet (750 mg total) by mouth 2 (two) times daily., Starting 04/25/2011, Until Sun 05/21/11, Normal    clindamycin (CLEOCIN) 300 MG capsule Take 2 capsules (600 mg total) by mouth every 8 (eight) hours., Starting 04/25/2011, Until Sun 05/21/11, Normal    glipiZIDE (GLUCOTROL) 10 MG tablet Take 0.5 tablets (5 mg total) by mouth 2 (two) times daily before a meal., Starting 04/25/2011, Until Wed 04/24/12, Normal    oxyCODONE-acetaminophen (PERCOCET) 5-325 MG per tablet Take 1-2 tablets by mouth every 4 (four) hours as needed., Starting 04/25/2011, Until Fri 05/05/11, Print      CONTINUE these medications which have NOT CHANGED   Details  lisinopril  (PRINIVIL,ZESTRIL) 10 MG tablet Take 10 mg by mouth daily.  , Starting 03/23/2011, Until Fri 03/22/12, Historical Med    metFORMIN (GLUCOPHAGE) 1000 MG tablet Take 1,000 mg by mouth 2 (two) times daily with a meal.  , Starting 12/29/2010, Until Fri 12/29/11, Historical Med      STOP taking these medications     ibuprofen (ADVIL,MOTRIN) 200 MG tablet        Follow-up Information    Follow up with Nadara Mustard, MD on 04/27/2011. (at 1:15pm)    Contact information:   [address] [place] [address] [phone]       Follow up with MATTHEWS,CODY on 05/01/2011. (at 2:00 pm)    Contact information:   [address] [place] [address] [phone]          Signed: Rodman Pickle 04/26/2011, 3:11  PM   

## 2011-04-26 NOTE — Discharge Summary (Signed)
I interviewed and examined this patient and discussed the care plan with Dr. Mikel Cella and the [facility] team and agree with assessment and plan as documented in the discharge note for today. Rather than a new "common nerve" he will need a new glucometer.      Steven [name] A. Sheffield Slider, MD Family Medicine Teaching Service Attending  04/26/2011 7:00 PM

## 2011-04-27 ENCOUNTER — Other Ambulatory Visit (HOSPITAL_COMMUNITY): Payer: Self-pay | Admitting: Orthopedic Surgery

## 2011-05-01 ENCOUNTER — Ambulatory Visit (INDEPENDENT_AMBULATORY_CARE_PROVIDER_SITE_OTHER): Payer: Medicaid Other | Admitting: Family Medicine

## 2011-05-01 ENCOUNTER — Encounter (HOSPITAL_COMMUNITY): Payer: Self-pay | Admitting: Pharmacy Technician

## 2011-05-01 ENCOUNTER — Encounter: Payer: Self-pay | Admitting: Home Health Services

## 2011-05-01 ENCOUNTER — Telehealth: Payer: Self-pay | Admitting: Home Health Services

## 2011-05-01 VITALS — BP 187/93 | HR 73 | Temp 98.8°F | Wt 207.1 lb

## 2011-05-01 DIAGNOSIS — L97509 Non-pressure chronic ulcer of other part of unspecified foot with unspecified severity: Secondary | ICD-10-CM

## 2011-05-01 DIAGNOSIS — I10 Essential (primary) hypertension: Secondary | ICD-10-CM

## 2011-05-01 DIAGNOSIS — E119 Type 2 diabetes mellitus without complications: Secondary | ICD-10-CM

## 2011-05-01 DIAGNOSIS — E1169 Type 2 diabetes mellitus with other specified complication: Secondary | ICD-10-CM

## 2011-05-01 DIAGNOSIS — E11621 Type 2 diabetes mellitus with foot ulcer: Secondary | ICD-10-CM

## 2011-05-01 LAB — GLUCOSE, CAPILLARY: Glucose-Capillary: 459 mg/dL — ABNORMAL HIGH (ref 70–99)

## 2011-05-01 MED ORDER — BLOOD GLUCOSE METER KIT: PACK | Status: DC

## 2011-05-01 NOTE — Telephone Encounter (Signed)
Spoke with pt.  Needs help with this month's co-pays for 2 antibiotics, glipizide, lisinopril.  $12.  Approved by Dennison Nancy, dispensed $12 from [facility].   Pt is aware this is a 1 time donation and that he must meet with our social worker for future plans on medication co-payments.  Gave pt letter for pharmacist to sign and fax back to [place], indicated pt picked up medications as requested.

## 2011-05-01 NOTE — Patient Instructions (Addendum)
It was good seeing you today.  Take your metformin when you get home I will send over a prescription for a meter and strips to your pharmacy. Please fax over the form to show that you picked up your medications We will have our social worker Nelva Bush get in touch with you regarding resources for affording medications Please be sure to take all of your new medications as directed, return in one week after starting lisinopril for labwork

## 2011-05-02 ENCOUNTER — Other Ambulatory Visit: Payer: Self-pay

## 2011-05-02 ENCOUNTER — Encounter (HOSPITAL_COMMUNITY): Payer: Self-pay

## 2011-05-02 ENCOUNTER — Encounter (HOSPITAL_COMMUNITY)
Admission: RE | Admit: 2011-05-02 | Discharge: 2011-05-02 | Payer: Medicaid Other | Source: Ambulatory Visit | Attending: Orthopedic Surgery | Admitting: Orthopedic Surgery

## 2011-05-02 LAB — SURGICAL PCR SCREEN: MRSA, PCR: NEGATIVE

## 2011-05-02 MED ORDER — CEFAZOLIN SODIUM-DEXTROSE 2-3 GM-% IV SOLR
2.0000 g | INTRAVENOUS | Status: AC
  Administered 2011-05-03: 2 g via INTRAVENOUS
  Filled 2011-05-02: qty 50

## 2011-05-02 NOTE — Consult Note (Signed)
Anesthesia:  50 year old with hx of DM2, HTN, ETOH, and venous insufficiency scheduled for I&D of right foot wound abscess.  He was actually taken to the OR 04/21/11 for a 2nd toe ray amp.  LMA was used.  I was asked to evaluate his preoperative EKG.  At this time there are no comparison EKGs. He has no reported hx of CAD or CV symptoms.  Anticipate that since he tolerated a recent operative procedure, that he can proceed.  Unfortunately, his blood sugars do not appear well controlled, having ranged from 159 - 459 during his recent hospitalization.  Primarily glucose readings were in the 200s.  He will get a glucose check on arrival to Short Stay.  I have reviewed his 04/25/11 and 04/22/11 labs.  CXR from 04/23/11 also noted.

## 2011-05-02 NOTE — Pre-Procedure Instructions (Signed)
20 Steven [name]  05/02/2011   Your procedure is scheduled on: 05-03-2011  Report to [facility] at10:10 AM.  Call this number if you have problems the morning of surgery: [phone]   Remember:   Do not eat food:After Midnight.  Do not drink clear liquids: 4 Hours before arrival.  May have water,tea,soda,broth, apple juice,grape juice until 6:10 AM  Take these medicines the morning of surgery with A SIP OF WATER:  May have ain medcation if needed   Do not wear jewelry, make-up or nail polish.  Do not wear lotions, powders, or perfumes. You may wear deodorant.  Do not shave 48 hours prior to surgery.  Do not bring valuables to the hospital.  Contacts, dentures or bridgework may not be worn into surgery.  Leave suitcase in the car. After surgery it may be brought to your room.  For patients admitted to the hospital, checkout time is 11:00 AM the day of discharge.   Patients discharged the day of surgery will not be allowed to drive home.  Name and phone number of your driver:  Special Instructions: CHG Shower Use Special Wash: 1/2 bottle night before surgery and 1/2 bottle morning of surgery.   Please read over the following fact sheets that you were given: Surgical Site Infection Prevention

## 2011-05-03 ENCOUNTER — Ambulatory Visit (HOSPITAL_COMMUNITY)
Admission: RE | Admit: 2011-05-03 | Discharge: 2011-05-03 | Disposition: A | Discharge status: Home / Self Care | Payer: Medicaid Other | Source: Ambulatory Visit | Attending: Orthopedic Surgery | Admitting: Orthopedic Surgery

## 2011-05-03 ENCOUNTER — Encounter (HOSPITAL_COMMUNITY): Payer: Self-pay | Admitting: Vascular Surgery

## 2011-05-03 ENCOUNTER — Ambulatory Visit (HOSPITAL_COMMUNITY): Payer: Medicaid Other | Admitting: Vascular Surgery

## 2011-05-03 ENCOUNTER — Encounter (HOSPITAL_COMMUNITY): Payer: Self-pay | Admitting: *Deleted

## 2011-05-03 ENCOUNTER — Encounter (HOSPITAL_COMMUNITY)
Admission: RE | Disposition: A | Discharge status: Home / Self Care | Payer: Self-pay | Source: Ambulatory Visit | Attending: Orthopedic Surgery

## 2011-05-03 DIAGNOSIS — F17200 Nicotine dependence, unspecified, uncomplicated: Secondary | ICD-10-CM | POA: Insufficient documentation

## 2011-05-03 DIAGNOSIS — L03119 Cellulitis of unspecified part of limb: Secondary | ICD-10-CM | POA: Insufficient documentation

## 2011-05-03 DIAGNOSIS — E11621 Type 2 diabetes mellitus with foot ulcer: Secondary | ICD-10-CM

## 2011-05-03 DIAGNOSIS — K08109 Complete loss of teeth, unspecified cause, unspecified class: Secondary | ICD-10-CM | POA: Insufficient documentation

## 2011-05-03 DIAGNOSIS — Z794 Long term (current) use of insulin: Secondary | ICD-10-CM | POA: Insufficient documentation

## 2011-05-03 DIAGNOSIS — Z01810 Encounter for preprocedural cardiovascular examination: Secondary | ICD-10-CM | POA: Insufficient documentation

## 2011-05-03 DIAGNOSIS — I10 Essential (primary) hypertension: Secondary | ICD-10-CM | POA: Insufficient documentation

## 2011-05-03 DIAGNOSIS — E1149 Type 2 diabetes mellitus with other diabetic neurological complication: Secondary | ICD-10-CM | POA: Insufficient documentation

## 2011-05-03 DIAGNOSIS — L02619 Cutaneous abscess of unspecified foot: Secondary | ICD-10-CM | POA: Insufficient documentation

## 2011-05-03 DIAGNOSIS — L97409 Non-pressure chronic ulcer of unspecified heel and midfoot with unspecified severity: Secondary | ICD-10-CM | POA: Insufficient documentation

## 2011-05-03 HISTORY — PX: I&D EXTREMITY: SHX5045

## 2011-05-03 SURGERY — IRRIGATION AND DEBRIDEMENT EXTREMITY
Anesthesia: General | Site: Foot | Laterality: Right | Wound class: Dirty or Infected

## 2011-05-03 MED ORDER — HYDROMORPHONE HCL PF 1 MG/ML IJ SOLN
0.2500 mg | INTRAMUSCULAR | Status: DC | PRN
  Administered 2011-05-03 (×4): 0.5 mg via INTRAVENOUS

## 2011-05-03 MED ORDER — PROPOFOL 10 MG/ML IV EMUL
INTRAVENOUS | Status: DC | PRN
  Administered 2011-05-03: 50 mg via INTRAVENOUS
  Administered 2011-05-03: 150 mg via INTRAVENOUS

## 2011-05-03 MED ORDER — PROMETHAZINE HCL 25 MG/ML IJ SOLN: 6.2500 mg | INTRAMUSCULAR | Status: DC | PRN

## 2011-05-03 MED ORDER — FENTANYL CITRATE 0.05 MG/ML IJ SOLN: 100.0000 ug | Freq: Once | INTRAMUSCULAR | Status: DC

## 2011-05-03 MED ORDER — SODIUM CHLORIDE 0.9 % IR SOLN
Status: DC | PRN
  Administered 2011-05-03: 1000 mL

## 2011-05-03 MED ORDER — LACTATED RINGERS IV SOLN
INTRAVENOUS | Status: DC | PRN
  Administered 2011-05-03 (×2): via INTRAVENOUS

## 2011-05-03 MED ORDER — MIDAZOLAM HCL 2 MG/2ML IJ SOLN: 0.5000 mg | INTRAMUSCULAR | Status: DC | PRN

## 2011-05-03 MED ORDER — FENTANYL CITRATE 0.05 MG/ML IJ SOLN: 50.0000 ug | INTRAMUSCULAR | Status: DC | PRN

## 2011-05-03 MED ORDER — HYDROCODONE-ACETAMINOPHEN 5-500 MG PO TABS: 1.0000 | ORAL_TABLET | Freq: Four times a day (QID) | ORAL | Status: AC | PRN

## 2011-05-03 MED ORDER — FENTANYL CITRATE 0.05 MG/ML IJ SOLN
INTRAMUSCULAR | Status: DC | PRN
  Administered 2011-05-03: 100 ug via INTRAVENOUS
  Administered 2011-05-03: 50 ug via INTRAVENOUS

## 2011-05-03 MED ORDER — MEPERIDINE HCL 25 MG/ML IJ SOLN: 6.2500 mg | INTRAMUSCULAR | Status: DC | PRN

## 2011-05-03 MED ORDER — EPHEDRINE SULFATE 50 MG/ML IJ SOLN
INTRAMUSCULAR | Status: DC | PRN
  Administered 2011-05-03: 5 mg via INTRAVENOUS

## 2011-05-03 MED ORDER — MIDAZOLAM HCL 2 MG/ML PO SYRP: 0.5000 mg/kg | ORAL_SOLUTION | Freq: Once | ORAL | Status: DC

## 2011-05-03 MED ORDER — HYDROMORPHONE HCL PF 1 MG/ML IJ SOLN
INTRAMUSCULAR | Status: AC
  Filled 2011-05-03: qty 1

## 2011-05-03 SURGICAL SUPPLY — 49 items
BANDAGE COBAN STERILE 6 (GAUZE/BANDAGES/DRESSINGS) ×2 IMPLANT
BANDAGE GAUZE ELAST BULKY 4 IN (GAUZE/BANDAGES/DRESSINGS) ×2 IMPLANT
BLADE SURG 10 STRL SS (BLADE) ×2 IMPLANT
BNDG COHESIVE 4X5 TAN STRL (GAUZE/BANDAGES/DRESSINGS) ×2 IMPLANT
BNDG COHESIVE 6X5 TAN STRL LF (GAUZE/BANDAGES/DRESSINGS) ×4 IMPLANT
BNDG GAUZE STRTCH 6 (GAUZE/BANDAGES/DRESSINGS) ×6 IMPLANT
CLOTH BEACON ORANGE TIMEOUT ST (SAFETY) ×2 IMPLANT
COTTON STERILE ROLL (GAUZE/BANDAGES/DRESSINGS) ×2 IMPLANT
COVER SURGICAL LIGHT HANDLE (MISCELLANEOUS) ×2 IMPLANT
CUFF TOURNIQUET SINGLE 18IN (TOURNIQUET CUFF) ×2 IMPLANT
CUFF TOURNIQUET SINGLE 24IN (TOURNIQUET CUFF) IMPLANT
CUFF TOURNIQUET SINGLE 34IN LL (TOURNIQUET CUFF) IMPLANT
CUFF TOURNIQUET SINGLE 44IN (TOURNIQUET CUFF) IMPLANT
DRAPE U-SHAPE 47X51 STRL (DRAPES) ×2 IMPLANT
DRSG ADAPTIC 3X8 NADH LF (GAUZE/BANDAGES/DRESSINGS) ×2 IMPLANT
DURAPREP 26ML APPLICATOR (WOUND CARE) ×2 IMPLANT
ELECT CAUTERY BLADE 6.4 (BLADE) IMPLANT
ELECT REM PT RETURN 9FT ADLT (ELECTROSURGICAL)
ELECTRODE REM PT RTRN 9FT ADLT (ELECTROSURGICAL) IMPLANT
GLOVE BIOGEL PI IND STRL 9 (GLOVE) ×1 IMPLANT
GLOVE BIOGEL PI INDICATOR 9 (GLOVE) ×1
GLOVE SURG ORTHO 9.0 STRL STRW (GLOVE) ×2 IMPLANT
GOWN PREVENTION PLUS XLARGE (GOWN DISPOSABLE) ×2 IMPLANT
GOWN SRG XL XLNG 56XLVL 4 (GOWN DISPOSABLE) ×1 IMPLANT
GOWN STRL NON-REIN XL XLG LVL4 (GOWN DISPOSABLE) ×1
HANDPIECE INTERPULSE COAX TIP (DISPOSABLE)
KIT BASIN OR (CUSTOM PROCEDURE TRAY) ×2 IMPLANT
KIT ROOM TURNOVER OR (KITS) ×2 IMPLANT
MANIFOLD NEPTUNE II (INSTRUMENTS) ×2 IMPLANT
MICROMATRIX 1000MG (Tissue) ×2 IMPLANT
NS IRRIG 1000ML POUR BTL (IV SOLUTION) ×2 IMPLANT
PACK ORTHO EXTREMITY (CUSTOM PROCEDURE TRAY) ×2 IMPLANT
PAD ARMBOARD 7.5X6 YLW CONV (MISCELLANEOUS) ×2 IMPLANT
PADDING CAST COTTON 6X4 STRL (CAST SUPPLIES) ×2 IMPLANT
SET HNDPC FAN SPRY TIP SCT (DISPOSABLE) IMPLANT
SOLUTION PARTIC MCRMTRX 1000MG (Tissue) ×1 IMPLANT
SPONGE GAUZE 4X4 12PLY (GAUZE/BANDAGES/DRESSINGS) ×2 IMPLANT
SPONGE GAUZE 4X4 STERILE 39 (GAUZE/BANDAGES/DRESSINGS) ×2 IMPLANT
SPONGE LAP 18X18 X RAY DECT (DISPOSABLE) ×2 IMPLANT
STOCKINETTE IMPERVIOUS 9X36 MD (GAUZE/BANDAGES/DRESSINGS) ×2 IMPLANT
SWAB CULTURE LIQ STUART DBL (MISCELLANEOUS) IMPLANT
TOWEL OR 17X24 6PK STRL BLUE (TOWEL DISPOSABLE) ×2 IMPLANT
TOWEL OR 17X26 10 PK STRL BLUE (TOWEL DISPOSABLE) ×2 IMPLANT
TUBE ANAEROBIC SPECIMEN COL (MISCELLANEOUS) IMPLANT
TUBE CONNECTING 12X1/4 (SUCTIONS) ×2 IMPLANT
UNDERPAD 30X30 INCONTINENT (UNDERPADS AND DIAPERS) ×2 IMPLANT
WATER STERILE IRR 1000ML POUR (IV SOLUTION) ×2 IMPLANT
YANKAUER SUCT BULB TIP NO VENT (SUCTIONS) ×2 IMPLANT
surgical matrix 7cm x 10cm ×2 IMPLANT

## 2011-05-03 NOTE — Transfer of Care (Signed)
Immediate Anesthesia Transfer of Care Note  Patient: Steven [name]  Procedure(s) Performed:  IRRIGATION AND DEBRIDEMENT EXTREMITY - Irrigation and Debridement Right Foot and Place Acell Xenograft  Patient Location: PACU  Anesthesia Type: General  Level of Consciousness: awake and alert   Airway & Oxygen Therapy: Patient Spontanous Breathing and Patient connected to nasal cannula oxygen  Post-op Assessment: Report given to PACU RN, Post -op Vital signs reviewed and stable and Patient moving all extremities  Post vital signs: Reviewed and stable  Complications: No apparent anesthesia complications

## 2011-05-03 NOTE — Anesthesia Procedure Notes (Signed)
Procedure Name: LMA Insertion Date/Time: 05/03/2011 12:09 PM Performed by: Carmela Rima Pre-anesthesia Checklist: Patient identified, Timeout performed, Emergency Drugs available, Suction available and Patient being monitored Patient Re-evaluated:Patient Re-evaluated prior to inductionOxygen Delivery Method: Circle System Utilized Preoxygenation: Pre-oxygenation with 100% oxygen Intubation Type: IV induction Ventilation: Mask ventilation without difficulty LMA: LMA inserted LMA Size: 4.0 Tube type: Oral Number of attempts: 1 Placement Confirmation: ETT inserted through vocal cords under direct vision,  breath sounds checked- equal and bilateral,  positive ETCO2 and CO2 detector Tube secured with: Tape Dental Injury: Teeth and Oropharynx as per pre-operative assessment

## 2011-05-03 NOTE — H&P (Signed)
Steven [name] is an 50 y.o. male.   Chief Complaint: Ulcer plantar aspect right foot HPI: Patient presents status post foot salvage surgery with ray amputation and debridement of a large abscess on the plantar aspect of the right foot he presents at this time for repeat irrigation and debridement and placement of tissue graft.  Past Medical History  Diagnosis Date  . DM (diabetes mellitus)   . HTN (hypertension)   . History of DVT (deep vein thrombosis)   . Personal history of diabetic foot ulcer   . Peripheral vascular disease     Past Surgical History  Procedure Date  . Amputation 04/21/2011    Procedure: AMPUTATION RAY;  Surgeon: Nadara Mustard, MD;  Location: [facility] OR;  Service: Orthopedics;  Laterality: Right;  Rt foot 2nd ray ampt    Family History  Problem Relation Age of Onset  . Diabetes Mother   . Stroke Brother    Social History:  reports that he has been smoking Cigarettes.  He has a 15 pack-year smoking history. He has never used smokeless tobacco. He reports that he drinks about 2.5 - 3 ounces of alcohol per week. He reports that he uses illicit drugs.  Allergies: No Known Allergies  Medications Prior to Admission  Medication Dose Route Frequency Provider Last Rate Last Dose  . ceFAZolin (ANCEF) IVPB 2 g/50 mL premix  2 g Intravenous 60 min Pre-Op Nadara Mustard, MD       Medications Prior to Admission  Medication Sig Dispense Refill  . ciprofloxacin (CIPRO) 750 MG tablet Take 1 tablet (750 mg total) by mouth 2 (two) times daily.  50 tablet  2  . clindamycin (CLEOCIN) 300 MG capsule Take 2 capsules (600 mg total) by mouth every 8 (eight) hours.  75 capsule  0  . glipiZIDE (GLUCOTROL) 10 MG tablet Take 0.5 tablets (5 mg total) by mouth 2 (two) times daily before a meal.  60 tablet  0  . lisinopril (PRINIVIL,ZESTRIL) 10 MG tablet Take 10 mg by mouth daily.        . metFORMIN (GLUCOPHAGE) 1000 MG tablet Take 1,000 mg by mouth 2 (two) times daily with a meal.        .  oxyCODONE-acetaminophen (PERCOCET) 5-325 MG per tablet Take 1-2 tablets by mouth every 4 (four) hours as needed.  45 tablet  0    Results for orders placed during the hospital encounter of 05/03/11 (from the past 48 hour(s))  GLUCOSE, CAPILLARY     Status: Abnormal   Collection Time   05/03/11 10:28 AM      Component Value Range Comment   Glucose-Capillary 282 (*) 70 - 99 (mg/dL)    No results found.  ROS  Blood pressure 162/83, pulse 68, temperature 98.4 F (36.9 C), temperature source Oral, resp. rate 18, SpO2 98.00%. Physical Exam on examination patient is ischemic changes to the right lower extremity thin atrophic skin he does have palpable pulses. He is a large ulcer on the plantar aspect of the right foot.  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 Plan patient at present will proceed with irrigation debridement of the wound with placement of xenograft tissue graft. Plan for discharge to home postoperatively.  Johonna Binette V 05/03/2011, 11:49 AM

## 2011-05-03 NOTE — Op Note (Signed)
OPERATIVE REPORT  DATE OF SURGERY: 05/03/2011  PATIENT:  Steven [name],  50 y.o. male  PRE-OPERATIVE DIAGNOSIS:  Abscess Ulcer Right Foot  POST-OPERATIVE DIAGNOSIS:  Abscess Ulcer Right Foot  PROCEDURE:  Procedure(s): IRRIGATION AND DEBRIDEMENT EXTREMITY  SURGEON:  Surgeon(s): Nadara Mustard, MD  ANESTHESIA:   general  EBL: Minimal  SPECIMEN:  No Specimen  TOURNIQUET:  * No tourniquets in log *  PROCEDURE DETAILS: Patient was brought to OR room 10 and underwent a general anesthetic. After adequate levels of anesthesia were obtained patient's right lower extremity was prepped using DuraPrep and draped into a sterile field. The wound was debridement of skin soft tissue the wound was 12 x 8 cm x 3 cm deep. This was debrided back to bleeding viable healthy tissue. The a-CELL tissue graft was applied to the wound with the Ace cell powder deep within the wound. This was stapled in place this is covered by Mepitel ABDs 4 x 4's Kerlix and Coban. Patient was extubated taken the PACU in stable condition plan for discharge to home  PLAN OF CARE: Discharge to home after PACU  PATIENT DISPOSITION:  PACU - hemodynamically stable.   Nadara Mustard, MD 05/03/2011 12:44 PM

## 2011-05-03 NOTE — Anesthesia Preprocedure Evaluation (Addendum)
Anesthesia Evaluation  Patient identified by MRN, date of birth, ID band Patient awake    Reviewed: Allergy & Precautions, H&P , NPO status , Patient's Chart, lab work & pertinent test results  Airway Mallampati: II TM Distance: >3 FB Neck ROM: full    Dental No notable dental hx. (+) Edentulous Upper and Edentulous Lower   Pulmonary neg pulmonary ROS,  clear to auscultation  Pulmonary exam normal       Cardiovascular hypertension, On Medications neg cardio ROS regular Normal    Neuro/Psych Negative Neurological ROS  Negative Psych ROS   GI/Hepatic negative GI ROS, Neg liver ROS,   Endo/Other  Negative Endocrine ROSWell Controlled, Type 2  Renal/GU negative Renal ROS  Genitourinary negative   Musculoskeletal   Abdominal   Peds  Hematology negative hematology ROS (+)   Anesthesia Other Findings   Reproductive/Obstetrics negative OB ROS                           Anesthesia Physical Anesthesia Plan  ASA: III  Anesthesia Plan: General   Post-op Pain Management:    Induction: Intravenous  Airway Management Planned: LMA  Additional Equipment:   Intra-op Plan:   Post-operative Plan: Extubation in OR  Informed Consent: I have reviewed the patients History and Physical, chart, labs and discussed the procedure including the risks, benefits and alternatives for the proposed anesthesia with the patient or authorized representative who has indicated his/her understanding and acceptance.     Plan Discussed with: CRNA  Anesthesia Plan Comments:         Anesthesia Quick Evaluation

## 2011-05-03 NOTE — Anesthesia Postprocedure Evaluation (Signed)
  Anesthesia Post-op Note  Patient: Steven [name]  Procedure(s) Performed:  IRRIGATION AND DEBRIDEMENT EXTREMITY - Irrigation and Debridement Right Foot and Place Acell Xenograft  Patient Location: PACU  Anesthesia Type: General  Level of Consciousness: awake and alert   Airway and Oxygen Therapy: Patient Spontanous Breathing and Patient connected to nasal cannula oxygen  Post-op Pain: moderate  Post-op Assessment: Post-op Vital signs reviewed, Patient's Cardiovascular Status Stable, Respiratory Function Stable, Patent Airway and No signs of Nausea or vomiting  Post-op Vital Signs: stable  Complications: No apparent anesthesia complications

## 2011-05-03 NOTE — Preoperative (Signed)
Beta Blockers   Reason not to administer Beta Blockers:Not Applicable 

## 2011-05-07 NOTE — Assessment & Plan Note (Signed)
Poorly controlled and poorly compliant.  CBG 459 today, has not taken any meds.  I am very hesitant to start insulin on him because of his compliance issues and his inability to check his blood sugars even when he had a working meter.  I will send over an rx for a new meter.  Told him to take metformin when he gets home and get started on glipizide.  Given red flags.

## 2011-05-07 NOTE — Assessment & Plan Note (Addendum)
Has not started on lisinopril.  BP elevated.  Explained to him consequences of HTN especially in combination with diabetes.  Asked him to start on lisinopril, will need to f/u in 1 week after starting this to check creatinine.

## 2011-05-07 NOTE — Assessment & Plan Note (Signed)
Recent infection of ulceration with subsequent amputation of 2nd ray on R foot.  Plan for reirrigation on Wednesday.  Has not gotten antibiotics filled, states he can not afford these.  $12 dollars dispersed from indigent fund to cover co-pays for 2 abx, ACE-I and glipizide.  Explained to him that this would be a one time opportunity and that he would need to arrange a way to get his medications in the future.

## 2011-05-07 NOTE — Progress Notes (Signed)
  Subjective:    Patient ID: Steven [name], male    DOB: 1961/04/22, 50 y.o.   MRN: [id number]  HPI 1.Foot ulceration:  Recently hospitalized for infected diabetic foot ulcer.  Underwent amputation of 2nd ray by Dr. Lajoyce Corners.  States he did follow up with Dr. Lajoyce Corners as directed and they are planning on "cleaning out" foot and replacing graft again on 11/21.  Recently had dressing changed at Dr. Burna Sis office.  He is staying off the foot as directed.  Since his discharge he has not gotten his antibiotics filled because he states he can not afford the $3 co-pay on this medication.  He denies fever, chill, nausea and vomiting.  2. DM:  Was on metformin only prior to hospitalization.  Discharged on metformin and glipizide.  Patient has strong history of non-compliance with medications.  Never got glipizide filled since discharge.  Has not been checking a glucose at home because he does not have a working meter.  He has had symptoms of polyuria and polydipsia 3. HTN:  BP remains elevated.  Never got lisinopril filled, 2/2 to being unable to afford co-pay. Denies chest pain, headache, palpitations.    Review of Systems     Objective:   Physical Exam  Constitutional: He is oriented to person, place, and time.       Disheveled, NAD, in wheelchair  Cardiovascular: Normal rate, regular rhythm and normal heart sounds.   Pulmonary/Chest: Effort normal and breath sounds normal.  Abdominal: Soft. Bowel sounds are normal. He exhibits no distension.  Musculoskeletal:       Dressing on food clean/dry  Neurological: He is alert and oriented to person, place, and time.          Assessment & Plan:

## 2011-05-09 ENCOUNTER — Encounter (HOSPITAL_COMMUNITY): Payer: Self-pay | Admitting: Orthopedic Surgery

## 2011-05-12 ENCOUNTER — Telehealth: Payer: Self-pay | Admitting: Home Health Services

## 2011-05-12 ENCOUNTER — Telehealth: Payer: Self-pay | Admitting: Family Medicine

## 2011-05-12 NOTE — Telephone Encounter (Signed)
Needs more orders for his wound care - today was last day and needs to continue. Has an appt w/ Ashley Royalty on Monday

## 2011-05-12 NOTE — Telephone Encounter (Signed)
Request needs to be sent to Dr. Lajoyce Corners who performed surgery, to determine continued need for wound care.

## 2011-05-12 NOTE — Telephone Encounter (Signed)
The nurse has called back and would really like to get an order because otherwise todays visit will be a DC visit instead of a continuation visit.

## 2011-05-12 NOTE — Telephone Encounter (Signed)
Fwd. To Dr.Matthews for review. .Steven [name]  

## 2011-05-12 NOTE — Telephone Encounter (Signed)
Spoke with Steven [name].  Pt reports being able to get medications but has not faxed back proof of pick up from Pharmacy.  Pt states that [facility] has the paperwork.  Pt reports taking medications daily without missing a day.

## 2011-05-15 ENCOUNTER — Telehealth: Payer: Self-pay | Admitting: *Deleted

## 2011-05-15 ENCOUNTER — Encounter: Payer: Self-pay | Admitting: Family Medicine

## 2011-05-15 ENCOUNTER — Ambulatory Visit (INDEPENDENT_AMBULATORY_CARE_PROVIDER_SITE_OTHER): Payer: Medicaid Other | Admitting: Family Medicine

## 2011-05-15 VITALS — BP 134/95 | HR 93 | Temp 98.5°F | Wt 206.0 lb

## 2011-05-15 DIAGNOSIS — I10 Essential (primary) hypertension: Secondary | ICD-10-CM

## 2011-05-15 DIAGNOSIS — R197 Diarrhea, unspecified: Secondary | ICD-10-CM | POA: Insufficient documentation

## 2011-05-15 DIAGNOSIS — E1169 Type 2 diabetes mellitus with other specified complication: Secondary | ICD-10-CM

## 2011-05-15 DIAGNOSIS — L97509 Non-pressure chronic ulcer of other part of unspecified foot with unspecified severity: Secondary | ICD-10-CM

## 2011-05-15 DIAGNOSIS — Z23 Encounter for immunization: Secondary | ICD-10-CM

## 2011-05-15 DIAGNOSIS — E11621 Type 2 diabetes mellitus with foot ulcer: Secondary | ICD-10-CM

## 2011-05-15 LAB — BASIC METABOLIC PANEL
CO2: 25 mEq/L (ref 19–32)
Chloride: 100 mEq/L (ref 96–112)
Glucose, Bld: 160 mg/dL — ABNORMAL HIGH (ref 70–99)
Potassium: 4.3 mEq/L (ref 3.5–5.3)
Sodium: 135 mEq/L (ref 135–145)

## 2011-05-15 NOTE — Patient Instructions (Signed)
It was good seeing you today.   Bring in  a stool sample in as soon as possible so that we can check for a bacteria called C. Difficile to see if that is causing your diarrhea. If your wound care nurse is still not coming out please give our office or Dr. Audrie Lia office a call so that you can continue to get wound care.  I would like to see you back in two weeks to be sure everything is still going well with your foot.

## 2011-05-15 NOTE — Telephone Encounter (Signed)
Called [facility] again and spoke with Clydie Braun. She called Dr.[name] already for wound care orders. Lorenda Hatchet, Renato Battles

## 2011-05-23 NOTE — Assessment & Plan Note (Signed)
Concern for C. Diff colitis given recent hospitalization and antibiotics. Patient states he is unable to give sample today.  Asked to bring in sample to be tested for C. diff

## 2011-05-23 NOTE — Progress Notes (Signed)
  Subjective:    Patient ID: Steven [name], male    DOB: 09/25/1960, 50 y.o.   MRN: [id number]  HPI  1.  F/u foot:  Recently had 2nd ray amputation on R foot with skin graft placement.  After discharge he was unable to afford antibiotics and given assistance through [employer].  He did get medications filled and is taking these.  He had been having a HH nurse come out for dressing changes, however he states his phone got cut off so he is unsure when she is coming again.  The last time she came was last Wednesday.  He denies any pain or drainage from the foot. He is supposed to f/u with dr. Lajoyce Corners this week as well 2.  Diarrhea:  Has developed diarrhea since being on antibiotics.  Stool is watery with every bowel movement.  He denies abdominal pain, nausea, vomiting.  No blood in stool  3. HTN/Diabetes;  Just recently started on lisinopril.  Tolerating medication well.  Denies headache, chest pain, palpitations, lightheadedness.  Review of Systems     Objective:   Physical Exam  Constitutional:       Disheveled appearing, no distress   Cardiovascular: Normal rate and regular rhythm.   Pulmonary/Chest: Effort normal and breath sounds normal.  Abdominal: Soft. Bowel sounds are normal. He exhibits no distension. There is no tenderness.  Skin:       Foot undressed and cleaned with soap and water.  Graft in place.  No drainage or erythema  Silvadene gauze placed and wrapped with curlex and ace bandage          Assessment & Plan:

## 2011-05-23 NOTE — Assessment & Plan Note (Signed)
Spoke with Dr. Lajoyce Corners and foot cleaned and redressed per his recommendations.  Dr. Lajoyce Corners will have his wound care nurse continue his dressing changes.

## 2011-05-23 NOTE — Assessment & Plan Note (Signed)
Recently got lisinopril filled and started taking, BP improved.  Will check bmet to eval cr on ace-i

## 2011-05-29 ENCOUNTER — Encounter: Payer: Self-pay | Admitting: Family Medicine

## 2011-05-29 ENCOUNTER — Ambulatory Visit (INDEPENDENT_AMBULATORY_CARE_PROVIDER_SITE_OTHER): Payer: Medicaid Other | Admitting: Family Medicine

## 2011-05-29 DIAGNOSIS — L97509 Non-pressure chronic ulcer of other part of unspecified foot with unspecified severity: Secondary | ICD-10-CM

## 2011-05-29 DIAGNOSIS — L02212 Cutaneous abscess of back [any part, except buttock]: Secondary | ICD-10-CM

## 2011-05-29 DIAGNOSIS — E1169 Type 2 diabetes mellitus with other specified complication: Secondary | ICD-10-CM

## 2011-05-29 DIAGNOSIS — L03319 Cellulitis of trunk, unspecified: Secondary | ICD-10-CM

## 2011-05-29 MED ORDER — GLIPIZIDE 10 MG PO TABS: 5.0000 mg | ORAL_TABLET | Freq: Two times a day (BID) | ORAL | Status: DC

## 2011-05-29 MED ORDER — TRAMADOL HCL 50 MG PO TABS: 50.0000 mg | ORAL_TABLET | Freq: Four times a day (QID) | ORAL | Status: DC | PRN

## 2011-05-29 NOTE — Patient Instructions (Addendum)
I would like for you to use ibuprofen every 800mg  every 8 hours for the next week to see if this helps improve your shoulder Also I would like for you to try the tramadol for pain control of your foot and shoulder, Dr. Lajoyce Corners can decide if he wants to put you on anything stronger. I will see you back in 4 weeks to see how your shoulder pain is, please bring a log of your sugars when you return.

## 2011-06-12 DIAGNOSIS — L723 Sebaceous cyst: Secondary | ICD-10-CM | POA: Insufficient documentation

## 2011-06-12 NOTE — Assessment & Plan Note (Signed)
Is to f/u with Dr. Lajoyce Corners next week.  Wound nurse continues to come out.  No red flags today.

## 2011-06-12 NOTE — Progress Notes (Signed)
  Subjective:    Patient ID: Steven [name], male    DOB: 06-Jan-1961, 50 y.o.   MRN: [id number]  HPI 1.  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  Has been following up with Dr. Lajoyce Corners as directed.  Wound nurse is now coming back out to house for dressing changes.  Per patient Dr. Lajoyce Corners is pleased with progress thus far.  Still supposed to stay off foot.  Completed antibiotics.  Requesting refill on pain medication today.   2.  Bump on back:  Has noticed "bump on back" over the past week.  Area sore to touch.    Thinks it needs to be "popped".  Had previous excision of cyst  Many years ago.  Denies fever, chills, nausea.    Review of Systems Per HPI    Objective:   Physical Exam  Constitutional: He appears well-developed and well-nourished.  Musculoskeletal:       Foot dressed, but dressing appears clean/dry and intact  Skin:       Large pustule on upper back, fluctuant without erythema or warmth.  there is a small area that appears to have been draining, no active drainage currently           Assessment & Plan:

## 2011-06-12 NOTE — Assessment & Plan Note (Addendum)
Area with head and previous drainage.  Has had cyst removed from area before.  Large amount of caseous material expressed from area without I&D.  Fluctuance gone s/p drainage.

## 2011-06-28 ENCOUNTER — Ambulatory Visit: Payer: Medicaid Other | Admitting: Family Medicine

## 2011-07-26 ENCOUNTER — Other Ambulatory Visit: Payer: Self-pay | Admitting: Family Medicine

## 2011-07-26 MED ORDER — GLIPIZIDE 10 MG PO TABS: 5.0000 mg | ORAL_TABLET | Freq: Two times a day (BID) | ORAL | Status: DC

## 2011-07-31 ENCOUNTER — Encounter: Payer: Self-pay | Admitting: Family Medicine

## 2011-07-31 ENCOUNTER — Ambulatory Visit (INDEPENDENT_AMBULATORY_CARE_PROVIDER_SITE_OTHER): Payer: Medicaid Other | Admitting: Family Medicine

## 2011-07-31 VITALS — BP 155/98 | HR 82 | Ht 76.0 in | Wt 214.0 lb

## 2011-07-31 DIAGNOSIS — L03211 Cellulitis of face: Secondary | ICD-10-CM

## 2011-07-31 DIAGNOSIS — I10 Essential (primary) hypertension: Secondary | ICD-10-CM

## 2011-07-31 DIAGNOSIS — E119 Type 2 diabetes mellitus without complications: Secondary | ICD-10-CM

## 2011-07-31 DIAGNOSIS — G479 Sleep disorder, unspecified: Secondary | ICD-10-CM

## 2011-07-31 DIAGNOSIS — G478 Other sleep disorders: Secondary | ICD-10-CM

## 2011-07-31 DIAGNOSIS — L0201 Cutaneous abscess of face: Secondary | ICD-10-CM

## 2011-07-31 LAB — POCT GLYCOSYLATED HEMOGLOBIN (HGB A1C): Hemoglobin A1C: 7.3

## 2011-07-31 MED ORDER — GLUCOSE BLOOD VI STRP: ORAL_STRIP | Status: DC

## 2011-07-31 MED ORDER — METFORMIN HCL 1000 MG PO TABS: 1000.0000 mg | ORAL_TABLET | Freq: Two times a day (BID) | ORAL | Status: DC

## 2011-07-31 MED ORDER — GLIPIZIDE 10 MG PO TABS: 5.0000 mg | ORAL_TABLET | Freq: Two times a day (BID) | ORAL | Status: DC

## 2011-07-31 MED ORDER — DOXYCYCLINE HYCLATE 100 MG PO TABS: 100.0000 mg | ORAL_TABLET | Freq: Two times a day (BID) | ORAL | Status: AC

## 2011-07-31 MED ORDER — TRAMADOL HCL 50 MG PO TABS: 50.0000 mg | ORAL_TABLET | Freq: Four times a day (QID) | ORAL | Status: DC | PRN

## 2011-07-31 MED ORDER — LISINOPRIL 20 MG PO TABS
20.0000 mg | ORAL_TABLET | Freq: Every day | ORAL | Status: DC
Start: 2011-07-31 — End: 2011-11-09

## 2011-07-31 NOTE — Patient Instructions (Signed)
Thank you for coming in today, it was good to see you I am going to increase your lisinopril to help with your blood pressure Continue your current diabetes medications (glipizide and metformin) I am going to send over an antibiotic for your face, I want to see you back in two weeks to see if this is getting better.  If the area is getting worse or you start to have redness around the eye or vision changes come back sooner. For your sleep, try not to nap during the day.  You can try melatonin or diphenhydramine(benadryl) that you can get over the counter

## 2011-08-07 DIAGNOSIS — G479 Sleep disorder, unspecified: Secondary | ICD-10-CM | POA: Insufficient documentation

## 2011-08-07 DIAGNOSIS — L0201 Cutaneous abscess of face: Secondary | ICD-10-CM | POA: Insufficient documentation

## 2011-08-07 NOTE — Assessment & Plan Note (Signed)
Advised to focus on sleep hygiene and not napping as much during the day.  Will not start any pharmacological treatment at this time.

## 2011-08-07 NOTE — Assessment & Plan Note (Addendum)
BP still elevated, will increase lisinopril to 20 mg. Recheck 2 weeks

## 2011-08-07 NOTE — Assessment & Plan Note (Addendum)
A1c improved to 7.8 from 9.4.  Continue with curent diet changes.  Plans to walk some once foot is healed.  Continue current medications.  Return in 3 months

## 2011-08-07 NOTE — Assessment & Plan Note (Addendum)
No area of fluctuance that would be drainable.  Will treat with doxycycline, advised to return if getting larger or redness spreading. Recheck 2 weeks

## 2011-08-07 NOTE — Progress Notes (Signed)
  Subjective:    Patient ID: Steven [name], male    DOB: 03/27/1961, 51 y.o.   MRN: [id number]  HPI  1.DM:  Is checking CBG's but only sporadically, states when he checks he gets numbers around 130.  Has changed diet, eating more baked or boiled meats and is drinking diet sodas.  He does not eat a whole lot of sweets.    2. BP:  BP still elevated, not checking BP at home.  Is taking  Lisinopril.  Denies headache, chest pain, nausea or vomiting, weakness, shortness of breath   3. Bump on face:  Has had "bump" on L cheek x1.5 weeks.  Area is non-painful, denies fever, vision change.  No drainage from area.  4. Decreased sleep:  Complains of decreased sleep.  No trouble with falling asleep but wakes up in the middle of the night frequently.  He is taking naps during the day that typically last 2.5 hours.    Review of Systems     Objective:   Physical Exam  Constitutional: He appears well-nourished. No distress.  HENT:  Head: Normocephalic and atraumatic.  Cardiovascular: Normal rate, regular rhythm and normal heart sounds.   Pulmonary/Chest: Effort normal and breath sounds normal.  Musculoskeletal: He exhibits no edema.       Foot wrapped from prior ray amputation.  Undressed to view.  Looks to be healing well.  Wet to dry applied and rewrapped.    Skin:       Raised, indurated area on L cheek.  No fluctuance.  Non tender.            Assessment & Plan:

## 2011-08-14 ENCOUNTER — Ambulatory Visit: Payer: Medicaid Other | Admitting: Family Medicine

## 2011-08-30 ENCOUNTER — Encounter: Payer: Self-pay | Admitting: Family Medicine

## 2011-08-30 ENCOUNTER — Ambulatory Visit (INDEPENDENT_AMBULATORY_CARE_PROVIDER_SITE_OTHER): Payer: Medicaid Other | Admitting: Family Medicine

## 2011-08-30 VITALS — BP 139/87 | HR 86 | Temp 98.3°F | Ht 73.0 in | Wt 213.0 lb

## 2011-08-30 DIAGNOSIS — I10 Essential (primary) hypertension: Secondary | ICD-10-CM

## 2011-08-30 DIAGNOSIS — J3489 Other specified disorders of nose and nasal sinuses: Secondary | ICD-10-CM

## 2011-08-30 DIAGNOSIS — R21 Rash and other nonspecific skin eruption: Secondary | ICD-10-CM

## 2011-08-30 MED ORDER — FLUTICASONE PROPIONATE 50 MCG/ACT NA SUSP: 2.0000 | Freq: Every day | NASAL | Status: DC

## 2011-08-30 NOTE — Progress Notes (Signed)
  Subjective:    Patient ID: Steven [name], male    DOB: 09/07/1960, 51 y.o.   MRN: [id number]  HPI 1. HTN:  Ran out of lisinopril yesterday but had been taking as directed.  Does not check BP at home.  Denies chest pain, headache, shortness of breath, palpitations.  No change in diet.  No exercise 2/2 decreased ambulation from prior foot surgery (currently using motorized wheelchair).  2. Sinus problem:  Woke up this morning with pressure and congestion on left side of nostril along with some nose itchiness. Has had before when weather starts to change.  Does have slight cough and clear rhinorrhea with this.  Cough worse in the morning.  Denies purulent nasal discharge, fever, headache.  3. Bumps on back:  Has had recurrent "bumps" on back.  Typically come to a head and drain spontaneously.   Occasionally painful.  Wants to know what he can do to prevent this..    Review of Systems     Objective:   Physical Exam  Constitutional: He appears well-nourished. No distress.       Disheveled appearing.   HENT:  Head: Normocephalic and atraumatic.  Nose: Mucosal edema present. No rhinorrhea. Right sinus exhibits no maxillary sinus tenderness and no frontal sinus tenderness. Left sinus exhibits no maxillary sinus tenderness and no frontal sinus tenderness.  Mouth/Throat: Oropharynx is clear and moist.       Most teeth extracted, no abscess seen  Cardiovascular: Regular rhythm and normal heart sounds.   Pulmonary/Chest: Effort normal and breath sounds normal.  Musculoskeletal: He exhibits no edema.          Assessment & Plan:

## 2011-08-30 NOTE — Assessment & Plan Note (Signed)
URI vs allergic rhinitis, will give him a trial of intra-nasal steroid to see if this improves symptoms.

## 2011-08-30 NOTE — Assessment & Plan Note (Signed)
Multiple small pustular areas on back and shoulders.  No areas of fluctuance or induration.  Has had abscesses on back before.  Suggested trying CHG bath or bleach in water to help in preventing these.

## 2011-08-30 NOTE — Assessment & Plan Note (Signed)
BP better controlled today.  Continue lisinipril at 20mg .  F/u in 2 months, f/u DM at that time as well.

## 2011-08-30 NOTE — Patient Instructions (Signed)
Thank you for coming in today, it was good to see you Your blood pressure looks pretty good today, go to your pharmacy to pick up your refills on your medications For the recurrent bumps you can try bathing using a medicine called chlorhexidine or you can put 1/2 cup of bleach in a bathtub full of water. I have sent in a nose spray for you to see if this helps your symptoms, if your symptoms get worse or you develop fever please return sooner You can try benadryl to see if this helps with sleep, it may also improve your sinus symptoms. I will see you back in 2 months to follow up.

## 2011-09-21 ENCOUNTER — Other Ambulatory Visit: Payer: Self-pay | Admitting: Family Medicine

## 2011-10-03 ENCOUNTER — Telehealth: Payer: Self-pay | Admitting: *Deleted

## 2011-10-03 DIAGNOSIS — E119 Type 2 diabetes mellitus without complications: Secondary | ICD-10-CM

## 2011-10-03 MED ORDER — GLIPIZIDE 5 MG PO TABS: 5.0000 mg | ORAL_TABLET | Freq: Two times a day (BID) | ORAL | Status: DC

## 2011-10-03 NOTE — Telephone Encounter (Signed)
Received call from pharmacy asking if they can change Glipizide to a 5 mg tab twice daily instead of patient having to cut a 10 mg tab.  States he runs out too early frequently and she is concerned that he is taking the whole tablet.  Consulted with Dr. McDiarmid and he advises OK to change. Patient notified of this change. He states he was unable to break the 10 mg tab  so  at times has been taking the whole tablet . RX called to pharmacy.   Will forward message to Dr. Ashley Royalty  to make sure this is the dosage he wants him to stay on.

## 2011-10-09 NOTE — Telephone Encounter (Signed)
Yes, 5mg  BID is fine for him.  May need to increase at next appt.  Looks like 10mg  tab was ordered when he was in hospital  Thanks

## 2011-10-10 ENCOUNTER — Emergency Department (HOSPITAL_COMMUNITY): Payer: Medicaid Other

## 2011-10-10 ENCOUNTER — Encounter (HOSPITAL_COMMUNITY): Payer: Self-pay | Admitting: Emergency Medicine

## 2011-10-10 ENCOUNTER — Emergency Department (HOSPITAL_COMMUNITY)
Admission: EM | Admit: 2011-10-10 | Discharge: 2011-10-10 | Disposition: A | Discharge status: Home / Self Care | Payer: Medicaid Other | Attending: Emergency Medicine | Admitting: Emergency Medicine

## 2011-10-10 DIAGNOSIS — J329 Chronic sinusitis, unspecified: Secondary | ICD-10-CM | POA: Insufficient documentation

## 2011-10-10 DIAGNOSIS — I739 Peripheral vascular disease, unspecified: Secondary | ICD-10-CM | POA: Insufficient documentation

## 2011-10-10 DIAGNOSIS — Z79899 Other long term (current) drug therapy: Secondary | ICD-10-CM | POA: Insufficient documentation

## 2011-10-10 DIAGNOSIS — Z760 Encounter for issue of repeat prescription: Secondary | ICD-10-CM

## 2011-10-10 DIAGNOSIS — Z86718 Personal history of other venous thrombosis and embolism: Secondary | ICD-10-CM | POA: Insufficient documentation

## 2011-10-10 DIAGNOSIS — I10 Essential (primary) hypertension: Secondary | ICD-10-CM | POA: Insufficient documentation

## 2011-10-10 DIAGNOSIS — R51 Headache: Secondary | ICD-10-CM | POA: Insufficient documentation

## 2011-10-10 DIAGNOSIS — E119 Type 2 diabetes mellitus without complications: Secondary | ICD-10-CM | POA: Insufficient documentation

## 2011-10-10 DIAGNOSIS — F17200 Nicotine dependence, unspecified, uncomplicated: Secondary | ICD-10-CM | POA: Insufficient documentation

## 2011-10-10 LAB — ETHANOL: Alcohol, Ethyl (B): 11 mg/dL (ref 0–11)

## 2011-10-10 LAB — POCT I-STAT, CHEM 8
BUN: 8 mg/dL (ref 6–23)
Calcium, Ion: 1.26 mmol/L (ref 1.12–1.32)
Chloride: 104 mEq/L (ref 96–112)
Glucose, Bld: 103 mg/dL — ABNORMAL HIGH (ref 70–99)

## 2011-10-10 MED ORDER — LISINOPRIL 20 MG PO TABS
20.0000 mg | ORAL_TABLET | Freq: Once | ORAL | Status: AC
  Administered 2011-10-10: 20 mg via ORAL
  Filled 2011-10-10: qty 1

## 2011-10-10 MED ORDER — OXYCODONE HCL 5 MG PO TABS
10.0000 mg | ORAL_TABLET | Freq: Once | ORAL | Status: AC
  Administered 2011-10-10: 10 mg via ORAL
  Filled 2011-10-10: qty 2

## 2011-10-10 MED ORDER — LISINOPRIL 20 MG PO TABS: 20.0000 mg | ORAL_TABLET | Freq: Every day | ORAL | Status: DC

## 2011-10-10 MED ORDER — HYDROCODONE-ACETAMINOPHEN 5-500 MG PO TABS: 1.0000 | ORAL_TABLET | Freq: Four times a day (QID) | ORAL | Status: AC | PRN

## 2011-10-10 MED ORDER — AMOXICILLIN-POT CLAVULANATE 875-125 MG PO TABS: 1.0000 | ORAL_TABLET | Freq: Two times a day (BID) | ORAL | Status: AC

## 2011-10-10 NOTE — Discharge Instructions (Signed)
Sinus Headache A sinus headache is when your sinuses become clogged or swollen. Sinus headaches can range from mild to severe.  CAUSES A sinus headache can have different causes, such as:  Colds.   Sinus infections.   Allergies.  SYMPTOMS  Symptoms of a sinus headache may vary and can include:  Headache.   Pain or pressure in the face.   Congested or runny nose.   Fever.   Inability to smell.   Pain in upper teeth.  Weather changes can make symptoms worse. TREATMENT  The treatment of a sinus headache depends on the cause.  Sinus pain caused by a sinus infection may be treated with antibiotic medicine.   Sinus pain caused by allergies may be helped by allergy medicines (antihistamines) and medicated nasal sprays.   Sinus pain caused by congestion may be helped by flushing the nose and sinuses with saline solution.  HOME CARE INSTRUCTIONS   If antibiotics are prescribed, take them as directed. Finish them even if you start to feel better.   Only take over-the-counter or prescription medicines for pain, discomfort, or fever as directed by your caregiver.   If you have congestion, use a nasal spray to help reduce pressure.  SEEK IMMEDIATE MEDICAL CARE IF:  You have a fever.   You have headaches more than once a week.   You have sensitivity to light or sound.   You have repeated nausea and vomiting.   You have vision problems.   You have sudden, severe pain in your face or head.   You have a seizure.   You are confused.   Your sinus headaches do not get better after treatment. Many people think they have a sinus headache when they actually have migraines or tension headaches.  MAKE SURE YOU:   Understand these instructions.   Will watch your condition.   Will get help right away if you are not doing well or get worse.  Document Released: 07/06/2004 Document Revised: 05/18/2011 Document Reviewed: 08/27/2010 [facility] Patient Information 2012 [place],  [place].Medication Refill, Emergency Department We have refilled your medication today as a courtesy to you. It is best for your medical care, however, to take care of getting refills done through your primary caregiver's office. They have your records and can do a better job of follow-up than we can in the emergency department. On maintenance medications, we often only prescribe enough medications to get you by until you are able to see your regular caregiver. This is a more expensive way to refill medications. In the future, please plan for refills so that you will not have to use the emergency department for this. Thank you for your help. Your help allows [place] to better take care of the daily emergencies that enter our department. Document Released: 09/15/2003 Document Revised: 05/18/2011 Document Reviewed: 05/29/2005 [facility] Patient Information 2012 [place], [place].

## 2011-10-10 NOTE — ED Provider Notes (Signed)
History     CSN: [id number]  Arrival date & time 10/10/11  1191   First MD Initiated Contact with Patient 10/10/11 0542      Chief Complaint  Patient presents with  . Headache  . Hypertension     The history is provided by the patient.   the patient reports a severe headache for 2 days.  He reports is worse today.  He has been out of his lisinopril for approximately 2 weeks.  He denies recent head trauma.  He denies anticoagulant use.  He has a history of diabetes hypertension and DVTs not on anticoagulation.  He denies changes in his vision.  He has no photophobia or phonophobia.  His had no difficulty urinating.  He denies chest pain or shortness of breath.  He denies abdominal pain.  He does report drinking alcohol this evening.  He also has a history of cocaine abuse.    Past Medical History  Diagnosis Date  . DM (diabetes mellitus)   . HTN (hypertension)   . History of DVT (deep vein thrombosis)   . Personal history of diabetic foot ulcer   . Peripheral vascular disease     Past Surgical History  Procedure Date  . Amputation 04/21/2011    Procedure: AMPUTATION RAY;  Surgeon: Nadara Mustard, MD;  Location: [facility] OR;  Service: Orthopedics;  Laterality: Right;  Rt foot 2nd ray ampt  . I&d extremity 05/03/2011    Procedure: IRRIGATION AND DEBRIDEMENT EXTREMITY;  Surgeon: Nadara Mustard, MD;  Location: [facility] OR;  Service: Orthopedics;  Laterality: Right;  Irrigation and Debridement Right Foot and Place Acell Xenograft    Family History  Problem Relation Age of Onset  . Diabetes Mother   . Stroke Brother     History  Substance Use Topics  . Smoking status: Current Everyday Smoker -- 0.5 packs/day for 30 years    Types: Cigarettes  . Smokeless tobacco: Never Used  . Alcohol Use: 2.5 - 3.0 oz/week    5-6 drink(s) per week     t states he hs rank any alcholol rcently      Review of Systems  HENT:       Does report sinus discomfort   Neurological: Positive for headaches.    All other systems reviewed and are negative.    Allergies  Review of patient's allergies indicates no known allergies.  Home Medications   Current Outpatient Rx  Name Route Sig Dispense Refill  . FLUTICASONE PROPIONATE 50 MCG/ACT NA SUSP Nasal Place 2 sprays into the nose daily. 16 g 2  . GLIPIZIDE 5 MG PO TABS Oral Take 1 tablet (5 mg total) by mouth 2 (two) times daily before a meal. 60 tablet 1    Only take this medication WHEN YOU EAT! Do not tak ...  . LISINOPRIL 20 MG PO TABS Oral Take 1 tablet (20 mg total) by mouth daily. 30 tablet 3  . METFORMIN HCL 1000 MG PO TABS  TAKE ONE TABLET BY MOUTH TWICE DAILY WITH MEALS 60 tablet 3  . TRAMADOL HCL 50 MG PO TABS Oral Take 1 tablet (50 mg total) by mouth every 6 (six) hours as needed for pain. Maximum dose= 8 tablets per day 60 tablet 0  . AMOXICILLIN-POT CLAVULANATE 875-125 MG PO TABS Oral Take 1 tablet by mouth 2 (two) times daily. 20 tablet 0  . HYDROCODONE-ACETAMINOPHEN 5-500 MG PO TABS Oral Take 1-2 tablets by mouth every 6 (six) hours as needed for pain.  15 tablet 0  . LISINOPRIL 20 MG PO TABS Oral Take 1 tablet (20 mg total) by mouth daily. 30 tablet 1    BP 159/78  Pulse 77  Temp(Src) 98.5 F (36.9 C) (Oral)  Resp 18  SpO2 98%  Physical Exam  Nursing note and vitals reviewed. Constitutional: He is oriented to person, place, and time. He appears well-developed and well-nourished.  HENT:  Head: Normocephalic and atraumatic.  Eyes: EOM are normal. Pupils are equal, round, and reactive to light.  Neck: Normal range of motion.  Cardiovascular: Normal rate, regular rhythm, normal heart sounds and intact distal pulses.   Pulmonary/Chest: Effort normal and breath sounds normal. No respiratory distress.  Abdominal: Soft. He exhibits no distension. There is no tenderness.  Musculoskeletal: Normal range of motion.  Neurological: He is alert and oriented to person, place, and time.       5/5 strength in major muscle groups of   bilateral upper and lower extremities. Speech normal. No facial asymetry.   Skin: Skin is warm and dry.  Psychiatric: He has a normal mood and affect. Judgment normal.    ED Course  Procedures (including critical care time)  Labs Reviewed  GLUCOSE, CAPILLARY - Abnormal; Notable for the following:    Glucose-Capillary 108 (*)    All other components within normal limits  POCT I-STAT, CHEM 8 - Abnormal; Notable for the following:    Glucose, Bld 103 (*)    All other components within normal limits  ETHANOL   Ct Head Wo Contrast  10/10/2011  *RADIOLOGY REPORT*  Clinical Data: Headache, hypertension.  CT HEAD WITHOUT CONTRAST  Technique:  Contiguous axial images were obtained from the base of the skull through the vertex without contrast.  Comparison: 06/06/2009  Findings: There is no evidence for acute hemorrhage, hydrocephalus, mass lesion, or abnormal extra-axial fluid collection.  No definite CT evidence for acute infarction.  Partially opacified left maxillary sinus.  Otherwise, the visualized paranasal sinuses and mastoid air cells are predominately clear.  IMPRESSION: No acute intracranial abnormality identified.  Left maxillary sinus partially opacified.  May represent acute sinusitis in the appropriate clinical setting.  Original Report Authenticated By: Waneta Martins, M.D.     1. Headache   2. Sinus infection   3. Medication refill       MDM  Patient feels better after the pain medication.  His blood pressure in the emergency department 165/77 is not severe.  He was given his own home medication of lisinopril.  A prescription for lisinopril as been written.  Given the severity of his headache and his hypertension and medication noncompliance a CT scan of his head was obtained which demonstrates no evidence of bleed.  He did however show that there may be some sinus disease and the patient has had coughing congestion and reported sinus discomfort in the history.  Patient  placed on Augmentin for suspected sinusitis and sinus related headache        Lyanne Co, MD 10/10/11 [phone]

## 2011-10-10 NOTE — ED Notes (Signed)
[id number]<AV> Expected date:<BR> Expected time:<BR> Means of arrival:<BR> Comments:<BR> 51 yo male/headache-hx HTN

## 2011-10-10 NOTE — ED Notes (Signed)
Brought in by EMS with c/o headache and high blood pressure. Per EMS, pt ran out Lisinopril and has had not taken his BP medicine for a few days. Pt has been having headache since 2 days ago, the "worst" today.

## 2011-10-31 ENCOUNTER — Ambulatory Visit (INDEPENDENT_AMBULATORY_CARE_PROVIDER_SITE_OTHER): Payer: Medicaid Other | Admitting: Family Medicine

## 2011-10-31 ENCOUNTER — Encounter: Payer: Self-pay | Admitting: Family Medicine

## 2011-10-31 VITALS — BP 146/92 | HR 75 | Temp 98.8°F | Ht 73.0 in | Wt 222.0 lb

## 2011-10-31 DIAGNOSIS — E119 Type 2 diabetes mellitus without complications: Secondary | ICD-10-CM

## 2011-10-31 DIAGNOSIS — T148XXA Other injury of unspecified body region, initial encounter: Secondary | ICD-10-CM

## 2011-10-31 DIAGNOSIS — I10 Essential (primary) hypertension: Secondary | ICD-10-CM

## 2011-10-31 LAB — BASIC METABOLIC PANEL
BUN: 13 mg/dL (ref 6–23)
CO2: 26 mEq/L (ref 19–32)
Chloride: 103 mEq/L (ref 96–112)
Creat: 0.98 mg/dL (ref 0.50–1.35)
Glucose, Bld: 187 mg/dL — ABNORMAL HIGH (ref 70–99)

## 2011-10-31 NOTE — Patient Instructions (Signed)
Thank you for coming in today, it was good to see you Your diabetes control is improved, continue your current medications Be sure to follow up with Dr. Lajoyce Corners to have your foot looked at. I have sent refills in on your medications I will see you back in 3 months or sooner as needed

## 2011-11-02 ENCOUNTER — Telehealth: Payer: Self-pay | Admitting: Family Medicine

## 2011-11-02 NOTE — Telephone Encounter (Signed)
Calling to check status on documents for  [name].

## 2011-11-09 DIAGNOSIS — T148XXA Other injury of unspecified body region, initial encounter: Secondary | ICD-10-CM | POA: Insufficient documentation

## 2011-11-09 NOTE — Progress Notes (Addendum)
  Subjective:    Patient ID: Steven [name], male    DOB: [birth year], 51 y.o.   MRN: [id number]  HPI 1. DM:  Checking sugar at home, typically around 150.  Using medications without side effects.  Needs opthalmology referral.  Denies any lows.    2. HTN:  Does not check BP at home.  Takes lisinopril daily, however has not taken today yet.  Denies chest pain, palpitations, headache, nausea or vomiting.    3.  Blister on foot:  Has had blister on his foot x1 day.  States that he "popped" it yesterday.  Has been applying silvadene and keeping covered. Area non tender, no bleeding.  Denies fever, chills, purulent drainage.     Review of Systems PEr HPI    Objective:   Physical Exam  Constitutional: He appears well-nourished.       In motorized wheelchair   Cardiovascular: Normal rate, regular rhythm and normal heart sounds.   Pulmonary/Chest: Effort normal and breath sounds normal.  Musculoskeletal: Normal range of motion. He exhibits no edema.       R Foot with small blister on bottom of foot.  Does not appear to be ulceration.   No erythema, drainage or tenderness.          Assessment & Plan:

## 2011-11-09 NOTE — Assessment & Plan Note (Signed)
A1c improved, at goal at this point.  Will make no changes to medications.  Advised to continue medications  As directed, and check glucose regularly.  D-LDL and BMEt checked today.

## 2011-11-09 NOTE — Assessment & Plan Note (Addendum)
Blister on bottom of R foot, does not appear ulcerated or infected  at this time.  Part of skin removed by patient, covered with silvadene and redressed.   He is scheduled to follow up with Dr. Lajoyce Corners this week for his foot.

## 2011-11-09 NOTE — Telephone Encounter (Signed)
Received paperwork, will complete this week and mail back.

## 2011-11-09 NOTE — Assessment & Plan Note (Signed)
BP elevated today, has not taken lisinopril yet today.  Reminded to take medications before coming to appointments and to try to take at the same time each day.

## 2011-11-19 ENCOUNTER — Encounter: Payer: Self-pay | Admitting: Family Medicine

## 2011-11-19 DIAGNOSIS — G629 Polyneuropathy, unspecified: Secondary | ICD-10-CM

## 2011-11-19 HISTORY — DX: Polyneuropathy, unspecified: G62.9

## 2011-12-15 ENCOUNTER — Other Ambulatory Visit: Payer: Self-pay | Admitting: Family Medicine

## 2011-12-19 ENCOUNTER — Ambulatory Visit: Payer: Medicaid Other

## 2011-12-19 ENCOUNTER — Encounter: Payer: Self-pay | Admitting: Family Medicine

## 2011-12-19 ENCOUNTER — Ambulatory Visit (INDEPENDENT_AMBULATORY_CARE_PROVIDER_SITE_OTHER): Payer: Medicaid Other | Admitting: Family Medicine

## 2011-12-19 VITALS — BP 119/84 | HR 95 | Temp 98.3°F | Ht 73.0 in | Wt 221.4 lb

## 2011-12-19 DIAGNOSIS — M25512 Pain in left shoulder: Secondary | ICD-10-CM

## 2011-12-19 DIAGNOSIS — R197 Diarrhea, unspecified: Secondary | ICD-10-CM

## 2011-12-19 DIAGNOSIS — M25519 Pain in unspecified shoulder: Secondary | ICD-10-CM

## 2011-12-19 MED ORDER — MELOXICAM 15 MG PO TABS: 15.0000 mg | ORAL_TABLET | Freq: Every day | ORAL | Status: DC

## 2011-12-19 NOTE — Assessment & Plan Note (Signed)
Will watch for now. Most likely related to metformin. Advised to follow up with PCP in two weeks.

## 2011-12-19 NOTE — Patient Instructions (Addendum)
It was nice to meet you Steven [name]. 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 I will refer you to Physical Therapy for evaluation. I sent high dose MOBIC to your pharmacy.  Use as directed x 14 days. If you continue to have worsening pain, develop numbness/tingling in arms, please return to clinic. Follow up with your PCP as needed.

## 2011-12-19 NOTE — Progress Notes (Signed)
Subjective:     Patient ID: Steven [name], male   DOB: November 26, 1960, 51 y.o.   MRN: [id number]  HPI Mr. Oshields is a 51 y/o gentleman with a history of hypertension, diabetes mellitus, and alcohol abuse who presents to clinic today complaining of shoulder pain and diarrhea.   He states that the shoulder pain has been occuring for about the past two months. He describes the pain as a sharp pain localized between his neck and shoulder. The pain is relatively constant, and is not exacerbated by movement. He states he has tried muscle relaxants, tylenol, heating pads, and strengthening exercises to no effect.  Additionally, pain does not wake up him at night.  He denies any recent trauma or injury to shoulder.   He reports the diarrhea has occurred for approximately 1 week, and it seems to occur every time he eats a meal. He states the stool is watery and brown colored, but he denies any black stools or frank blood. He wonders if it could be related to his medication.  DM is well controlled per PCP.  He denies abdominal pain, fever, chills, nausea, vomiting, or decrease in appetite.   Review of Systems As per above.     Objective:   Physical Exam Gen: No acute distress HEENT: NCAT, neck is supple without lymphadenopathy CV: RRR, no murmurs, rubs, or gallops Pulm: LCAT Abd: Soft, non-tender Ext: No edema MSK: ROM intact, Strength 5/5 throughout, tenderness to palpation over the superior aspect of his left trapezius muscle; negative nerve impingement signs Skin: No rash Neuro: Alert and oriented to person, place, and time.     Assessment:  51 year old man presenting to clinic for evaluation of shoulder pain and diarrhea.   Plan:  1. Shoulder pain: Appears to be musculoskeletal in nature. Will write a prescription for meloxicam for symptom control. Recommended to continue using heating pad on tender area. Also counseled that perhaps a steroid injection could help within the future.  2.  Diarrhea: Will watch for now. Most likely related to metformin. Advised to follow up with PCP in two weeks.

## 2011-12-19 NOTE — Assessment & Plan Note (Signed)
On physical exam, patient's strength and impingement testing all normal.  He had a localized tender point under trapezius.  Will start MOBIC 15 mg daily x 14 days.  Last Creatinine was normal.  This could also be frozen shoulder with his history of DM, however DM well controlled and exam was benign.  Patient may also be drug seeking; when I left the room, he continued to shout out "Give me stronger pain meds."  Will refer to PT as well since he does not seem to understand home exercises.

## 2011-12-25 ENCOUNTER — Telehealth: Payer: Self-pay | Admitting: Family Medicine

## 2011-12-25 NOTE — Telephone Encounter (Signed)
Request for Independent Assessment for In-Home Care services to be completed by [place]\.

## 2012-01-02 ENCOUNTER — Ambulatory Visit (INDEPENDENT_AMBULATORY_CARE_PROVIDER_SITE_OTHER): Payer: Medicaid Other | Admitting: Family Medicine

## 2012-01-02 ENCOUNTER — Encounter: Payer: Self-pay | Admitting: Family Medicine

## 2012-01-02 VITALS — BP 144/90 | HR 83 | Ht 73.0 in | Wt 227.0 lb

## 2012-01-02 DIAGNOSIS — M25519 Pain in unspecified shoulder: Secondary | ICD-10-CM

## 2012-01-02 MED ORDER — GABAPENTIN 300 MG PO CAPS: 300.0000 mg | ORAL_CAPSULE | Freq: Three times a day (TID) | ORAL | Status: DC

## 2012-01-02 MED ORDER — PREDNISONE 50 MG PO TABS: 50.0000 mg | ORAL_TABLET | Freq: Every day | ORAL | Status: DC

## 2012-01-02 NOTE — Telephone Encounter (Signed)
Form completed placed up front for patient to pick up.

## 2012-01-02 NOTE — Progress Notes (Signed)
  Subjective:    Patient ID: Steven [name], male    DOB: [birth year], 51 y.o.   MRN: [id number]  HPI 1. Neck/Shoulder pain:  Patient with c/o L neck and Shoulder pain. Present for a few months, worsening over the past couple of weeks. States pain starts at his neck and radiates down his L arm.  State pain feels like "electricity" at times.  He reports numbness in digits 3-5 on the L hand.  Strength is normal.      Review of Systems No pain or weakness in other extremities.      Objective:   Physical Exam  Constitutional: He appears well-nourished. No distress.       In motorized wheelchair   HENT:  Head: Normocephalic and atraumatic.  Cardiovascular: Normal rate and regular rhythm.   Musculoskeletal:       Tenderness in L Lateral neck as well as upper trapezius.  Decreased sensation in 4th and 5th digits on L compared to R.  Strength is 5/5 and he has FROM in shoulder.  Spurling sign positive on the L, patient states feels like electric shock.   Neurological: He is alert.          Assessment & Plan:

## 2012-01-02 NOTE — Patient Instructions (Addendum)
Thank you for coming in today, it was good to see you I want you to go to get x-rays of your neck.  You can have this done at the hospital I am sending in two medications to help with your pain 1. Prednisone 2. Gabapentin While you are taking the prednisone be sure to check your sugars daily.  If you are getting numbers higher than 400 give our office a call.

## 2012-01-02 NOTE — Assessment & Plan Note (Signed)
Radicular in nature, with positive spurlings.  Will get neck films.  Will start on steroid burst, discussed keeping close eye on glucose while on this.  Will also start gabapentin.  F/u with me in 4 weeks.

## 2012-01-05 ENCOUNTER — Ambulatory Visit (HOSPITAL_COMMUNITY)
Admission: RE | Admit: 2012-01-05 | Discharge: 2012-01-05 | Disposition: A | Discharge status: Home / Self Care | Payer: Medicaid Other | Source: Ambulatory Visit | Attending: Family Medicine | Admitting: Family Medicine

## 2012-01-05 DIAGNOSIS — M5412 Radiculopathy, cervical region: Secondary | ICD-10-CM | POA: Insufficient documentation

## 2012-01-05 DIAGNOSIS — M5030 Other cervical disc degeneration, unspecified cervical region: Secondary | ICD-10-CM | POA: Insufficient documentation

## 2012-01-05 DIAGNOSIS — M25519 Pain in unspecified shoulder: Secondary | ICD-10-CM | POA: Insufficient documentation

## 2012-01-08 ENCOUNTER — Ambulatory Visit: Payer: Medicaid Other | Attending: Family Medicine | Admitting: Physical Therapy

## 2012-01-08 DIAGNOSIS — M25519 Pain in unspecified shoulder: Secondary | ICD-10-CM | POA: Insufficient documentation

## 2012-01-08 DIAGNOSIS — R293 Abnormal posture: Secondary | ICD-10-CM | POA: Insufficient documentation

## 2012-01-08 DIAGNOSIS — M546 Pain in thoracic spine: Secondary | ICD-10-CM | POA: Insufficient documentation

## 2012-01-08 DIAGNOSIS — IMO0001 Reserved for inherently not codable concepts without codable children: Secondary | ICD-10-CM | POA: Insufficient documentation

## 2012-01-08 DIAGNOSIS — M542 Cervicalgia: Secondary | ICD-10-CM | POA: Insufficient documentation

## 2012-01-18 ENCOUNTER — Other Ambulatory Visit: Payer: Self-pay | Admitting: Family Medicine

## 2012-01-18 ENCOUNTER — Other Ambulatory Visit: Payer: Self-pay | Admitting: *Deleted

## 2012-01-18 DIAGNOSIS — I10 Essential (primary) hypertension: Secondary | ICD-10-CM

## 2012-01-18 DIAGNOSIS — E119 Type 2 diabetes mellitus without complications: Secondary | ICD-10-CM

## 2012-01-18 MED ORDER — GLIPIZIDE 5 MG PO TABS: 5.0000 mg | ORAL_TABLET | Freq: Two times a day (BID) | ORAL | Status: DC

## 2012-01-18 MED ORDER — LISINOPRIL 20 MG PO TABS: 20.0000 mg | ORAL_TABLET | Freq: Every day | ORAL | Status: DC

## 2012-01-23 ENCOUNTER — Ambulatory Visit: Payer: Medicaid Other | Attending: Family Medicine | Admitting: Rehabilitation

## 2012-01-23 DIAGNOSIS — M546 Pain in thoracic spine: Secondary | ICD-10-CM | POA: Insufficient documentation

## 2012-01-23 DIAGNOSIS — M25519 Pain in unspecified shoulder: Secondary | ICD-10-CM | POA: Insufficient documentation

## 2012-01-23 DIAGNOSIS — R293 Abnormal posture: Secondary | ICD-10-CM | POA: Insufficient documentation

## 2012-01-23 DIAGNOSIS — IMO0001 Reserved for inherently not codable concepts without codable children: Secondary | ICD-10-CM | POA: Insufficient documentation

## 2012-01-23 DIAGNOSIS — M542 Cervicalgia: Secondary | ICD-10-CM | POA: Insufficient documentation

## 2012-01-30 ENCOUNTER — Ambulatory Visit: Payer: Medicaid Other | Admitting: Rehabilitation

## 2012-02-06 ENCOUNTER — Ambulatory Visit: Payer: Medicaid Other | Admitting: Rehabilitation

## 2012-02-29 ENCOUNTER — Emergency Department (HOSPITAL_COMMUNITY)
Admission: EM | Admit: 2012-02-29 | Discharge: 2012-02-29 | Disposition: A | Discharge status: Home / Self Care | Payer: Medicaid Other | Attending: Emergency Medicine | Admitting: Emergency Medicine

## 2012-02-29 ENCOUNTER — Encounter (HOSPITAL_COMMUNITY): Payer: Self-pay | Admitting: Emergency Medicine

## 2012-02-29 DIAGNOSIS — K859 Acute pancreatitis without necrosis or infection, unspecified: Secondary | ICD-10-CM

## 2012-02-29 DIAGNOSIS — F17200 Nicotine dependence, unspecified, uncomplicated: Secondary | ICD-10-CM | POA: Insufficient documentation

## 2012-02-29 DIAGNOSIS — G609 Hereditary and idiopathic neuropathy, unspecified: Secondary | ICD-10-CM | POA: Insufficient documentation

## 2012-02-29 DIAGNOSIS — E119 Type 2 diabetes mellitus without complications: Secondary | ICD-10-CM | POA: Insufficient documentation

## 2012-02-29 DIAGNOSIS — Z86718 Personal history of other venous thrombosis and embolism: Secondary | ICD-10-CM | POA: Insufficient documentation

## 2012-02-29 DIAGNOSIS — I739 Peripheral vascular disease, unspecified: Secondary | ICD-10-CM | POA: Insufficient documentation

## 2012-02-29 DIAGNOSIS — I10 Essential (primary) hypertension: Secondary | ICD-10-CM | POA: Insufficient documentation

## 2012-02-29 LAB — CBC WITH DIFFERENTIAL/PLATELET
Eosinophils Absolute: 0.1 10*3/uL (ref 0.0–0.7)
Eosinophils Relative: 1 % (ref 0–5)
HCT: 45.4 % (ref 39.0–52.0)
Hemoglobin: 15.9 g/dL (ref 13.0–17.0)
Lymphs Abs: 2.4 10*3/uL (ref 0.7–4.0)
MCH: 29.8 pg (ref 26.0–34.0)
MCV: 85.2 fL (ref 78.0–100.0)
Monocytes Absolute: 0.6 10*3/uL (ref 0.1–1.0)
Monocytes Relative: 10 % (ref 3–12)
Neutrophils Relative %: 48 % (ref 43–77)
RBC: 5.33 MIL/uL (ref 4.22–5.81)

## 2012-02-29 LAB — URINE MICROSCOPIC-ADD ON

## 2012-02-29 LAB — COMPREHENSIVE METABOLIC PANEL
Alkaline Phosphatase: 56 U/L (ref 39–117)
BUN: 9 mg/dL (ref 6–23)
GFR calc Af Amer: 85 mL/min — ABNORMAL LOW (ref >90)
Glucose, Bld: 248 mg/dL — ABNORMAL HIGH (ref 70–99)
Potassium: 4.4 mEq/L (ref 3.5–5.1)
Total Protein: 7.5 g/dL (ref 6.0–8.3)

## 2012-02-29 LAB — URINALYSIS, ROUTINE W REFLEX MICROSCOPIC
Ketones, ur: 15 mg/dL — AB
Leukocytes, UA: NEGATIVE
Nitrite: NEGATIVE
Protein, ur: NEGATIVE mg/dL
pH: 5.5 (ref 5.0–8.0)

## 2012-02-29 MED ORDER — PROMETHAZINE HCL 25 MG PO TABS: 25.0000 mg | ORAL_TABLET | Freq: Four times a day (QID) | ORAL | Status: DC | PRN

## 2012-02-29 MED ORDER — OXYCODONE-ACETAMINOPHEN 5-325 MG PO TABS: 1.0000 | ORAL_TABLET | Freq: Four times a day (QID) | ORAL | Status: DC | PRN

## 2012-02-29 MED ORDER — OXYCODONE-ACETAMINOPHEN 5-325 MG PO TABS
1.0000 | ORAL_TABLET | Freq: Once | ORAL | Status: AC
  Administered 2012-02-29: 1 via ORAL
  Filled 2012-02-29: qty 1

## 2012-02-29 NOTE — ED Provider Notes (Signed)
History     CSN: [id number]  Arrival date & time 02/29/12  0546   First MD Initiated Contact with Patient 02/29/12 0601      Chief Complaint  Patient presents with  . Abdominal Pain    (Consider location/radiation/quality/duration/timing/severity/associated sxs/prior treatment) HPI Comments: Patient with no history of abdominal surgery presents today with chief complaint of abdominal pain. He reports the pain began Monday (3 days ago), had a gradual onset and describes the pain as achy and constant. The pain is not worse at during any time of day and is not associated with eating. The pain began on the right lower abdomen and is now worse on the left side. He has never had pain like this before. He has taken Maalox which has provided no relief. The course is constant and unchanged. He states he has had "watery" bowel movement yesterday. His last bowel movement was yesterday. He also made himself vomit yesterday due to the nausea. No other vomiting. His sugar has been running in the 200's, which he states is abnormal. He does not check his sugar daily but does take his medication as directed. Past medical history of diabetes mellitus and hypertension. He denies fever, chills, nausea, chest pain, shortness of breath, constipation, change in appetite, dysuria and hematuria. He drinks alcohol (#3-40oz/day) and smokes. He has never had a colonoscopy. He denies family history of abdominal problems.  Patient is a 51 y.o. male presenting with abdominal pain. The history is provided by the patient.  Abdominal Pain The primary symptoms of the illness include abdominal pain, nausea, vomiting and diarrhea. The primary symptoms of the illness do not include fever, shortness of breath or dysuria.  Symptoms associated with the illness do not include chills, constipation or hematuria. Significant associated medical issues do not include PUD or diverticulitis.    Past Medical History  Diagnosis Date  . DM  (diabetes mellitus)   . HTN (hypertension)   . History of DVT (deep vein thrombosis)   . Personal history of diabetic foot ulcer   . Peripheral vascular disease   . Peripheral neuropathy 11/19/2011  . Personal history of diabetic foot ulcer     Past Surgical History  Procedure Date  . Amputation 04/21/2011    Procedure: AMPUTATION RAY;  Surgeon: Nadara Mustard, MD;  Location: [facility] OR;  Service: Orthopedics;  Laterality: Right;  Rt foot 2nd ray ampt  . I&d extremity 05/03/2011    Procedure: IRRIGATION AND DEBRIDEMENT EXTREMITY;  Surgeon: Nadara Mustard, MD;  Location: [facility] OR;  Service: Orthopedics;  Laterality: Right;  Irrigation and Debridement Right Foot and Place Acell Xenograft    Family History  Problem Relation Age of Onset  . Diabetes Mother   . Stroke Brother     History  Substance Use Topics  . Smoking status: Current Every Day Smoker -- 0.5 packs/day for 30 years    Types: Cigarettes  . Smokeless tobacco: Never Used  . Alcohol Use: 2.5 - 3.0 oz/week    5-6 drink(s) per week     t states he hs rank any alcholol rcently      Review of Systems  Constitutional: Negative for fever, chills and appetite change.  HENT: Negative for sore throat and rhinorrhea.   Eyes: Negative for redness.  Respiratory: Negative for cough and shortness of breath.   Cardiovascular: Negative for chest pain.  Gastrointestinal: Positive for nausea, vomiting, abdominal pain and diarrhea. Negative for constipation and blood in stool.  Genitourinary: Negative for  dysuria and hematuria.  Musculoskeletal: Negative for myalgias.  Skin: Negative for rash.  Neurological: Negative for headaches.    Allergies  Review of patient's allergies indicates no known allergies.  Home Medications   Current Outpatient Rx  Name Route Sig Dispense Refill  . FLUTICASONE PROPIONATE 50 MCG/ACT NA SUSP Nasal Place 2 sprays into the nose daily. 16 g 2  . GABAPENTIN 300 MG PO CAPS Oral Take 1 capsule (300 mg total) by  mouth 3 (three) times daily. 90 capsule 1  . GLIPIZIDE 5 MG PO TABS Oral Take 1 tablet (5 mg total) by mouth 2 (two) times daily before a meal. 60 tablet 2  . LISINOPRIL 20 MG PO TABS Oral Take 1 tablet (20 mg total) by mouth daily. 30 tablet 1  . METFORMIN HCL 1000 MG PO TABS  TAKE ONE TABLET BY MOUTH TWICE DAILY WITH MEALS 60 tablet 3    BP 134/101  Pulse 103  Temp 97.7 F (36.5 C) (Oral)  Resp 18  SpO2 98%  Physical Exam  Nursing note and vitals reviewed. Constitutional: He appears well-developed and well-nourished.       In no acute distress  HENT:  Head: Normocephalic and atraumatic.  Eyes: Conjunctivae normal are normal. Right eye exhibits no discharge. Left eye exhibits no discharge.  Neck: Normal range of motion. Neck supple.  Cardiovascular: Normal rate, regular rhythm and normal heart sounds.   No murmur heard. Pulmonary/Chest: Effort normal and breath sounds normal. No respiratory distress. He has no wheezes.  Abdominal: Soft. Normal appearance and bowel sounds are normal. He exhibits no mass. There is tenderness in the left upper quadrant and left lower quadrant. There is no rebound and no guarding.  Musculoskeletal: Normal range of motion.  Neurological: He is alert.  Skin: Skin is warm and dry.  Psychiatric: He has a normal mood and affect.    ED Course  Procedures (including critical care time)  Labs Reviewed  GLUCOSE, CAPILLARY - Abnormal; Notable for the following:    Glucose-Capillary 219 (*)     All other components within normal limits  COMPREHENSIVE METABOLIC PANEL - Abnormal; Notable for the following:    Sodium 132 (*)     Glucose, Bld 248 (*)     GFR calc non Af Amer 73 (*)     GFR calc Af Amer 85 (*)     All other components within normal limits  URINALYSIS, ROUTINE W REFLEX MICROSCOPIC - Abnormal; Notable for the following:    Glucose, UA >1000 (*)     Bilirubin Urine SMALL (*)     Ketones, ur 15 (*)     All other components within normal  limits  LIPASE, BLOOD - Abnormal; Notable for the following:    Lipase 212 (*)     All other components within normal limits  URINE MICROSCOPIC-ADD ON - Abnormal; Notable for the following:    Casts HYALINE CASTS (*)     All other components within normal limits  CBC WITH DIFFERENTIAL   No results found.   1. Pancreatitis     6:42 AM Patient seen and examined. D/w Dr. Hyacinth Meeker who has seen. Pending labs.  Vital signs reviewed and are as follows: Filed Vitals:   02/29/12 0553  BP: 134/101  Pulse: 103  Temp: 97.7 F (36.5 C)  Resp: 18   Plan: awaiting labs, if abnormal CT. If reassuring, d/c home with course of abx to treat for colitis/diverticulitis, [facility] follow-up.   7:48 AM Lipase  212. Patient informed of results. PO fluid trial and pain medicine given.   Counseled patient extensively on need to decrease alcohol use. Patient urged to follow up with his primary care physician regarding this. Urged patient to intake only clear non-alcoholic liquids for the next 69-62 hours.  The patient was urged to return to the Emergency Department immediately with worsening of current symptoms, worsening abdominal pain, persistent vomiting, blood noted in stools, fever, or any other concerns. The patient verbalized understanding.   Patient counseled on use of narcotic pain medications. Counseled not to combine these medications with others containing tylenol. Urged not to drink alcohol, drive, or perform any other activities that requires focus while taking these medications. The patient verbalizes understanding and agrees with the plan.   MDM  Patient with up early mild pancreatitis. He is tolerating oral fluids and solids. He does not appear dehydrated. His white blood cell count is normal. Would be low risk mortality with Ranson criteria even if LDH positive. Patient is appropriate for discharge and wishes to go home.        [place] 02/29/12 [phone]

## 2012-02-29 NOTE — ED Notes (Signed)
Pt reports generalized belly pain; denies SOB; chest pain; nausea; diarr; vomiting. Reports sugars have been running high in 200's.

## 2012-02-29 NOTE — ED Provider Notes (Signed)
51 year old male who presents with left-sided abdominal pain. States he's had pain for approximately 3 days, occasionally on the right side but predominantly the left side. He has mild nausea, forced himself to vomit one time but otherwise has been tolerating food including a [place] sandwich last night. He admits to using some baking soda to help with his stomach and has subsequently had several watery stools which are nonbloody. The pain is crampy, waxes and wanes in intensity and is not associated with fevers, chills, no prior surgery.  Physical exam:  Well-appearing, soft and non-peritoneal abdomen with mild left-sided periumbilical tenderness, no pain at McBurney's point, no right upper quadrant tenderness, clear lungs, clear heart, pulse 90. Oropharynx is clear and moist, lower strumming is without edema, no obvious rashes, mental status is normal and answers questions appropriately.  Assessment:  Well-appearing, no peritoneal signs, doubt significant pathologic sources of pain including AAA, mesenteric ischemia, bowel perforation, abscess. More likely to be early colitis or diverticulitis was a possibility of pancreatitis though his pain is not in the epigastrium. Vital signs at this time reveal no fever, hypotension and on my exam he is not tachycardic. Labs are pending, will order pain medication,   Medical screening examination/treatment/procedure(s) were conducted as a shared visit with non-physician practitioner(s) and myself.  I personally evaluated the patient during the encounter    Vida Roller, MD 03/01/12 [phone]

## 2012-02-29 NOTE — ED Notes (Signed)
PT's request for food and drink denied until further evaluation and test results.

## 2012-03-01 NOTE — ED Provider Notes (Signed)
Medical screening examination/treatment/procedure(s) were conducted as a shared visit with non-physician practitioner(s) and myself.  I personally evaluated the patient during the encounter  Please see my separate respective documentation pertaining to this patient encounter   Vida Roller, MD 03/01/12 [phone]

## 2012-03-05 ENCOUNTER — Encounter: Payer: Self-pay | Admitting: Family Medicine

## 2012-03-05 ENCOUNTER — Ambulatory Visit (INDEPENDENT_AMBULATORY_CARE_PROVIDER_SITE_OTHER): Payer: Medicaid Other | Admitting: Family Medicine

## 2012-03-05 VITALS — BP 136/89 | HR 90 | Temp 99.0°F | Ht 73.0 in | Wt 220.0 lb

## 2012-03-05 DIAGNOSIS — K859 Acute pancreatitis without necrosis or infection, unspecified: Secondary | ICD-10-CM

## 2012-03-05 LAB — COMPREHENSIVE METABOLIC PANEL
CO2: 22 mEq/L (ref 19–32)
Calcium: 10.2 mg/dL (ref 8.4–10.5)
Chloride: 98 mEq/L (ref 96–112)
Creat: 1.1 mg/dL (ref 0.50–1.35)
Glucose, Bld: 359 mg/dL — ABNORMAL HIGH (ref 70–99)
Total Bilirubin: 0.3 mg/dL (ref 0.3–1.2)

## 2012-03-05 LAB — LIPASE: Lipase: 18 U/L (ref 11–59)

## 2012-03-05 MED ORDER — OXYCODONE-ACETAMINOPHEN 5-325 MG PO TABS: 1.0000 | ORAL_TABLET | Freq: Four times a day (QID) | ORAL | Status: DC | PRN

## 2012-03-05 NOTE — Patient Instructions (Addendum)
Thank you for coming in today, it was good to see you You have pancreatitis.  Be sure to continue to drink lots of clear fluids.   AVOID ANY PRODUCTS WITH ALCOHOL AS THEY WILL MAKE YOUR PROBLEM WORSE Return to our clinic or go to the emergency room if you develop fever, chills, worsening pain, or are unable to eat or drink

## 2012-03-07 NOTE — Assessment & Plan Note (Addendum)
Pain likely to be related to pancreatitis.  No peritoneal signs, fever or increased pain.  He is able to tolerate PO without any problem.  I will give him a few more days of percocet to be used at night.  Will recheck lipase to ensure this is resolving and check CMET to assess hepatic and renal function to be sure he is remaining hydrated.  Advised to avoid EtOH as this will make this more likely to reoccur.

## 2012-03-07 NOTE — Progress Notes (Signed)
  Subjective:    Patient ID: Steven [name], male    DOB: 1960/11/30, 51 y.o.   MRN: [id number]  HPI  1. Abdominal pain:  Here for ED f/u for abdominal pain.  Dx with pancreatitis in ED on 9/19, however was able to tolerate PO and had good pain control with oral pain medications so was discharged home.  Here today with complaint that he still has abdominal pain.  Pain seems to bother him worse when he is laying flat at night.  Better with sitting up.  He is still tolerating PO and has been eating clear liquids for the past couple of days.  Oxycodone rx'ed in ED and this controlled pain well.  He does have a hx of EtOH abuse but he has abstained from drinking since this episode began.  Denies any symptoms of withdrawal including sweats, tremor, restlessness.   Review of Systems No fever, nausea, vomiting, blood in stool, abdominal bruising, decreased urine output.     Objective:   Physical Exam  Constitutional: He appears well-nourished. No distress.  HENT:  Head: Normocephalic and atraumatic.  Mouth/Throat: Oropharynx is clear and moist. No oropharyngeal exudate.  Eyes: Conjunctivae normal are normal. No scleral icterus.  Neck: Neck supple.  Cardiovascular: Normal rate and regular rhythm.   Pulmonary/Chest: Effort normal and breath sounds normal.  Abdominal: Soft. Bowel sounds are normal. There is tenderness. There is no rebound and no guarding.  Musculoskeletal: He exhibits no edema.  Neurological: He is alert.          Assessment & Plan:

## 2012-03-29 ENCOUNTER — Other Ambulatory Visit: Payer: Self-pay | Admitting: *Deleted

## 2012-03-29 DIAGNOSIS — I10 Essential (primary) hypertension: Secondary | ICD-10-CM

## 2012-03-29 MED ORDER — LISINOPRIL 20 MG PO TABS: 20.0000 mg | ORAL_TABLET | Freq: Every day | ORAL | Status: DC

## 2012-04-23 ENCOUNTER — Encounter (HOSPITAL_COMMUNITY): Payer: Self-pay | Admitting: Pharmacy Technician

## 2012-04-23 ENCOUNTER — Other Ambulatory Visit (HOSPITAL_COMMUNITY): Payer: Self-pay | Admitting: Orthopedic Surgery

## 2012-04-23 ENCOUNTER — Encounter (HOSPITAL_COMMUNITY): Payer: Self-pay | Admitting: *Deleted

## 2012-04-23 ENCOUNTER — Telehealth: Payer: Self-pay | Admitting: *Deleted

## 2012-04-23 MED ORDER — DEXTROSE 5 % IV SOLN
3.0000 g | INTRAVENOUS | Status: AC
  Administered 2012-04-24: 3 g via INTRAVENOUS
  Filled 2012-04-23: qty 3000

## 2012-04-23 NOTE — Progress Notes (Signed)
I notified Steven [name] through voice mail to have Dr Lajoyce Corners sign orders.

## 2012-04-23 NOTE — Telephone Encounter (Signed)
Due to patient having Medicaid, office calling to request NPI #  to authorize appt for surgery (amputation) tomorrow. NPI # given.  Gaylene Brooks, RN

## 2012-04-24 ENCOUNTER — Ambulatory Visit (HOSPITAL_COMMUNITY): Payer: Medicaid Other | Admitting: Anesthesiology

## 2012-04-24 ENCOUNTER — Ambulatory Visit (HOSPITAL_COMMUNITY)
Admission: RE | Admit: 2012-04-24 | Discharge: 2012-04-25 | Disposition: A | Discharge status: Home / Self Care | Payer: Medicaid Other | Source: Ambulatory Visit | Attending: Orthopedic Surgery | Admitting: Orthopedic Surgery

## 2012-04-24 ENCOUNTER — Encounter (HOSPITAL_COMMUNITY)
Admission: RE | Disposition: A | Discharge status: Home / Self Care | Payer: Self-pay | Source: Ambulatory Visit | Attending: Orthopedic Surgery

## 2012-04-24 ENCOUNTER — Encounter (HOSPITAL_COMMUNITY): Payer: Self-pay | Admitting: Anesthesiology

## 2012-04-24 ENCOUNTER — Ambulatory Visit (HOSPITAL_COMMUNITY): Payer: Medicaid Other

## 2012-04-24 DIAGNOSIS — M25519 Pain in unspecified shoulder: Secondary | ICD-10-CM

## 2012-04-24 DIAGNOSIS — Z86718 Personal history of other venous thrombosis and embolism: Secondary | ICD-10-CM | POA: Insufficient documentation

## 2012-04-24 DIAGNOSIS — E1149 Type 2 diabetes mellitus with other diabetic neurological complication: Secondary | ICD-10-CM | POA: Insufficient documentation

## 2012-04-24 DIAGNOSIS — E1142 Type 2 diabetes mellitus with diabetic polyneuropathy: Secondary | ICD-10-CM | POA: Insufficient documentation

## 2012-04-24 DIAGNOSIS — E1169 Type 2 diabetes mellitus with other specified complication: Secondary | ICD-10-CM | POA: Insufficient documentation

## 2012-04-24 DIAGNOSIS — Z23 Encounter for immunization: Secondary | ICD-10-CM | POA: Insufficient documentation

## 2012-04-24 DIAGNOSIS — M9080 Osteopathy in diseases classified elsewhere, unspecified site: Secondary | ICD-10-CM | POA: Insufficient documentation

## 2012-04-24 DIAGNOSIS — M869 Osteomyelitis, unspecified: Secondary | ICD-10-CM

## 2012-04-24 DIAGNOSIS — E119 Type 2 diabetes mellitus without complications: Secondary | ICD-10-CM

## 2012-04-24 DIAGNOSIS — I10 Essential (primary) hypertension: Secondary | ICD-10-CM | POA: Insufficient documentation

## 2012-04-24 HISTORY — DX: Asymptomatic varicose veins of unspecified lower extremity: I83.90

## 2012-04-24 HISTORY — PX: TOE AMPUTATION: SHX809

## 2012-04-24 HISTORY — PX: AMPUTATION: SHX166

## 2012-04-24 LAB — COMPREHENSIVE METABOLIC PANEL
ALT: 10 U/L (ref 0–53)
AST: 11 U/L (ref 0–37)
CO2: 25 mEq/L (ref 19–32)
Calcium: 9.8 mg/dL (ref 8.4–10.5)
Chloride: 101 mEq/L (ref 96–112)
GFR calc non Af Amer: >90 mL/min (ref >90)
Sodium: 138 mEq/L (ref 135–145)

## 2012-04-24 LAB — APTT: aPTT: 28 seconds (ref 24–37)

## 2012-04-24 LAB — GLUCOSE, CAPILLARY
Glucose-Capillary: 188 mg/dL — ABNORMAL HIGH (ref 70–99)
Glucose-Capillary: 179 mg/dL — ABNORMAL HIGH (ref 70–99)

## 2012-04-24 LAB — CBC
Hemoglobin: 14.7 g/dL (ref 13.0–17.0)
MCHC: 34.1 g/dL (ref 30.0–36.0)
RBC: 5.03 MIL/uL (ref 4.22–5.81)

## 2012-04-24 LAB — SURGICAL PCR SCREEN: MRSA, PCR: NEGATIVE

## 2012-04-24 SURGERY — AMPUTATION, FOOT, RAY
Anesthesia: General | Site: Foot | Laterality: Right | Wound class: Dirty or Infected

## 2012-04-24 MED ORDER — HYDROMORPHONE HCL PF 1 MG/ML IJ SOLN
0.2500 mg | INTRAMUSCULAR | Status: DC | PRN
  Administered 2012-04-24 (×3): 0.5 mg via INTRAVENOUS

## 2012-04-24 MED ORDER — CEFAZOLIN SODIUM-DEXTROSE 2-3 GM-% IV SOLR
2.0000 g | Freq: Four times a day (QID) | INTRAVENOUS | Status: AC
  Administered 2012-04-24 – 2012-04-25 (×3): 2 g via INTRAVENOUS
  Filled 2012-04-24 (×3): qty 50

## 2012-04-24 MED ORDER — METFORMIN HCL 500 MG PO TABS
1000.0000 mg | ORAL_TABLET | Freq: Two times a day (BID) | ORAL | Status: DC
  Administered 2012-04-24 – 2012-04-25 (×2): 1000 mg via ORAL
  Filled 2012-04-24 (×4): qty 2

## 2012-04-24 MED ORDER — 0.9 % SODIUM CHLORIDE (POUR BTL) OPTIME
TOPICAL | Status: DC | PRN
  Administered 2012-04-24: 1000 mL

## 2012-04-24 MED ORDER — OXYCODONE-ACETAMINOPHEN 5-325 MG PO TABS
1.0000 | ORAL_TABLET | ORAL | Status: DC | PRN
  Administered 2012-04-24 – 2012-04-25 (×5): 2 via ORAL
  Filled 2012-04-24 (×5): qty 2

## 2012-04-24 MED ORDER — ONDANSETRON HCL 4 MG/2ML IJ SOLN: 4.0000 mg | Freq: Four times a day (QID) | INTRAMUSCULAR | Status: DC | PRN

## 2012-04-24 MED ORDER — OXYCODONE HCL 5 MG/5ML PO SOLN: 5.0000 mg | Freq: Once | ORAL | Status: DC | PRN

## 2012-04-24 MED ORDER — GLIPIZIDE 5 MG PO TABS
5.0000 mg | ORAL_TABLET | Freq: Two times a day (BID) | ORAL | Status: DC
  Administered 2012-04-24 – 2012-04-25 (×2): 5 mg via ORAL
  Filled 2012-04-24 (×4): qty 1

## 2012-04-24 MED ORDER — FENTANYL CITRATE 0.05 MG/ML IJ SOLN
INTRAMUSCULAR | Status: DC | PRN
  Administered 2012-04-24 (×2): 50 ug via INTRAVENOUS

## 2012-04-24 MED ORDER — PROPOFOL 10 MG/ML IV BOLUS
INTRAVENOUS | Status: DC | PRN
  Administered 2012-04-24: 200 mg via INTRAVENOUS

## 2012-04-24 MED ORDER — DIPHENHYDRAMINE HCL 50 MG/ML IJ SOLN
INTRAMUSCULAR | Status: AC
  Filled 2012-04-24: qty 1

## 2012-04-24 MED ORDER — DIPHENHYDRAMINE HCL 50 MG/ML IJ SOLN
12.5000 mg | INTRAMUSCULAR | Status: DC | PRN
  Administered 2012-04-24: 12.5 mg via INTRAVENOUS

## 2012-04-24 MED ORDER — MUPIROCIN 2 % EX OINT
TOPICAL_OINTMENT | Freq: Two times a day (BID) | CUTANEOUS | Status: DC
  Administered 2012-04-24: 1 via NASAL
  Filled 2012-04-24 (×2): qty 22

## 2012-04-24 MED ORDER — DEXTROSE 5 % IV SOLN: 3.0000 g | INTRAVENOUS | Status: DC

## 2012-04-24 MED ORDER — HYDROCODONE-ACETAMINOPHEN 5-325 MG PO TABS: 1.0000 | ORAL_TABLET | ORAL | Status: DC | PRN

## 2012-04-24 MED ORDER — FLUTICASONE PROPIONATE 50 MCG/ACT NA SUSP
2.0000 | Freq: Every day | NASAL | Status: DC
  Administered 2012-04-25: 2 via NASAL
  Filled 2012-04-24: qty 16

## 2012-04-24 MED ORDER — OXYCODONE HCL 5 MG PO TABS: 5.0000 mg | ORAL_TABLET | Freq: Once | ORAL | Status: DC | PRN

## 2012-04-24 MED ORDER — METOCLOPRAMIDE HCL 5 MG/ML IJ SOLN: 5.0000 mg | Freq: Three times a day (TID) | INTRAMUSCULAR | Status: DC | PRN

## 2012-04-24 MED ORDER — HYDROMORPHONE HCL PF 1 MG/ML IJ SOLN: 0.5000 mg | INTRAMUSCULAR | Status: DC | PRN

## 2012-04-24 MED ORDER — MIDAZOLAM HCL 5 MG/5ML IJ SOLN
INTRAMUSCULAR | Status: DC | PRN
  Administered 2012-04-24: 1 mg via INTRAVENOUS

## 2012-04-24 MED ORDER — ONDANSETRON HCL 4 MG PO TABS: 4.0000 mg | ORAL_TABLET | Freq: Four times a day (QID) | ORAL | Status: DC | PRN

## 2012-04-24 MED ORDER — GABAPENTIN 300 MG PO CAPS
300.0000 mg | ORAL_CAPSULE | Freq: Three times a day (TID) | ORAL | Status: DC
  Administered 2012-04-24 – 2012-04-25 (×3): 300 mg via ORAL
  Filled 2012-04-24 (×5): qty 1

## 2012-04-24 MED ORDER — HYDROMORPHONE HCL PF 1 MG/ML IJ SOLN
INTRAMUSCULAR | Status: AC
  Filled 2012-04-24: qty 2

## 2012-04-24 MED ORDER — SODIUM CHLORIDE 0.9 % IV SOLN
INTRAVENOUS | Status: DC
  Administered 2012-04-24: 15:00:00 via INTRAVENOUS

## 2012-04-24 MED ORDER — PROMETHAZINE HCL 25 MG/ML IJ SOLN: 6.2500 mg | INTRAMUSCULAR | Status: DC | PRN

## 2012-04-24 MED ORDER — LIDOCAINE HCL (CARDIAC) 20 MG/ML IV SOLN
INTRAVENOUS | Status: DC | PRN
  Administered 2012-04-24: 100 mg via INTRAVENOUS

## 2012-04-24 MED ORDER — LISINOPRIL 20 MG PO TABS
20.0000 mg | ORAL_TABLET | Freq: Every day | ORAL | Status: DC
  Administered 2012-04-25: 20 mg via ORAL
  Filled 2012-04-24: qty 1

## 2012-04-24 MED ORDER — PNEUMOCOCCAL VAC POLYVALENT 25 MCG/0.5ML IJ INJ
0.5000 mL | INJECTION | INTRAMUSCULAR | Status: AC
  Administered 2012-04-25: 0.5 mL via INTRAMUSCULAR
  Filled 2012-04-24: qty 0.5

## 2012-04-24 MED ORDER — LACTATED RINGERS IV SOLN
INTRAVENOUS | Status: DC
  Administered 2012-04-24: 10:00:00 via INTRAVENOUS

## 2012-04-24 MED ORDER — ONDANSETRON HCL 4 MG/2ML IJ SOLN
INTRAMUSCULAR | Status: DC | PRN
  Administered 2012-04-24: 4 mg via INTRAVENOUS

## 2012-04-24 MED ORDER — METOCLOPRAMIDE HCL 10 MG PO TABS: 5.0000 mg | ORAL_TABLET | Freq: Three times a day (TID) | ORAL | Status: DC | PRN

## 2012-04-24 MED ORDER — INFLUENZA VIRUS VACC SPLIT PF IM SUSP
0.5000 mL | INTRAMUSCULAR | Status: AC
  Administered 2012-04-25: 0.5 mL via INTRAMUSCULAR
  Filled 2012-04-24: qty 0.5

## 2012-04-24 MED ORDER — LACTATED RINGERS IV SOLN
INTRAVENOUS | Status: DC | PRN
  Administered 2012-04-24: 10:00:00 via INTRAVENOUS

## 2012-04-24 SURGICAL SUPPLY — 40 items
BANDAGE ESMARK 6X9 LF (GAUZE/BANDAGES/DRESSINGS) IMPLANT
BANDAGE GAUZE ELAST BULKY 4 IN (GAUZE/BANDAGES/DRESSINGS) ×2 IMPLANT
BLADE SAW SGTL MED 73X18.5 STR (BLADE) IMPLANT
BNDG COHESIVE 4X5 TAN STRL (GAUZE/BANDAGES/DRESSINGS) IMPLANT
BNDG COHESIVE 6X5 TAN STRL LF (GAUZE/BANDAGES/DRESSINGS) ×2 IMPLANT
BNDG ESMARK 6X9 LF (GAUZE/BANDAGES/DRESSINGS)
CLOTH BEACON ORANGE TIMEOUT ST (SAFETY) ×2 IMPLANT
CUFF TOURNIQUET SINGLE 34IN LL (TOURNIQUET CUFF) IMPLANT
CUFF TOURNIQUET SINGLE 44IN (TOURNIQUET CUFF) IMPLANT
DRAPE U-SHAPE 47X51 STRL (DRAPES) ×4 IMPLANT
DRSG ADAPTIC 3X8 NADH LF (GAUZE/BANDAGES/DRESSINGS) ×2 IMPLANT
DRSG PAD ABDOMINAL 8X10 ST (GAUZE/BANDAGES/DRESSINGS) ×2 IMPLANT
DURAPREP 26ML APPLICATOR (WOUND CARE) ×2 IMPLANT
ELECT REM PT RETURN 9FT ADLT (ELECTROSURGICAL) ×2
ELECTRODE REM PT RTRN 9FT ADLT (ELECTROSURGICAL) ×1 IMPLANT
GLOVE BIOGEL PI IND STRL 9 (GLOVE) ×1 IMPLANT
GLOVE BIOGEL PI INDICATOR 9 (GLOVE) ×1
GLOVE SURG ORTHO 9.0 STRL STRW (GLOVE) ×2 IMPLANT
GOWN PREVENTION PLUS XLARGE (GOWN DISPOSABLE) ×2 IMPLANT
GOWN SRG XL XLNG 56XLVL 4 (GOWN DISPOSABLE) ×1 IMPLANT
GOWN STRL NON-REIN XL XLG LVL4 (GOWN DISPOSABLE) ×1
KIT BASIN OR (CUSTOM PROCEDURE TRAY) ×2 IMPLANT
KIT ROOM TURNOVER OR (KITS) ×2 IMPLANT
MANIFOLD NEPTUNE II (INSTRUMENTS) IMPLANT
NS IRRIG 1000ML POUR BTL (IV SOLUTION) ×2 IMPLANT
PACK ORTHO EXTREMITY (CUSTOM PROCEDURE TRAY) ×2 IMPLANT
PAD ARMBOARD 7.5X6 YLW CONV (MISCELLANEOUS) ×4 IMPLANT
PAD CAST 4YDX4 CTTN HI CHSV (CAST SUPPLIES) IMPLANT
PADDING CAST COTTON 4X4 STRL (CAST SUPPLIES)
SPONGE GAUZE 4X4 12PLY (GAUZE/BANDAGES/DRESSINGS) ×2 IMPLANT
SPONGE LAP 18X18 X RAY DECT (DISPOSABLE) ×2 IMPLANT
STAPLER VISISTAT 35W (STAPLE) ×2 IMPLANT
STOCKINETTE IMPERVIOUS LG (DRAPES) IMPLANT
SUCTION FRAZIER TIP 10 FR DISP (SUCTIONS) ×2 IMPLANT
SUT ETHILON 2 0 PSLX (SUTURE) ×4 IMPLANT
TOWEL OR 17X24 6PK STRL BLUE (TOWEL DISPOSABLE) ×2 IMPLANT
TOWEL OR 17X26 10 PK STRL BLUE (TOWEL DISPOSABLE) ×2 IMPLANT
TUBE CONNECTING 12X1/4 (SUCTIONS) ×2 IMPLANT
UNDERPAD 30X30 INCONTINENT (UNDERPADS AND DIAPERS) ×2 IMPLANT
WATER STERILE IRR 1000ML POUR (IV SOLUTION) IMPLANT

## 2012-04-24 NOTE — Transfer of Care (Signed)
Immediate Anesthesia Transfer of Care Note  Patient: Steven [name]  Procedure(s) Performed: Procedure(s) (LRB) with comments: AMPUTATION RAY (Right) - Right foot 1st ray amputation  Patient Location: PACU  Anesthesia Type:General  Level of Consciousness: awake, alert  and oriented  Airway & Oxygen Therapy: Patient Spontanous Breathing and Patient connected to nasal cannula oxygen  Post-op Assessment: Report given to PACU RN and Post -op Vital signs reviewed and stable  Post vital signs: Reviewed and stable  Complications: No apparent anesthesia complications

## 2012-04-24 NOTE — Evaluation (Signed)
Physical Therapy Evaluation Patient Details Name: Steven [name] MRN: [id number] DOB: 05-16-1961 Today's Date: 04/24/2012 Time: 8295-6213 PT Time Calculation (min): 25 min  PT Assessment / Plan / Recommendation Clinical Impression  Pt is a 51 y/o  male s/p 1st ray amputation of R foot.  Pt will be seen once more by PT to evaluate pt on crutches.   No PT follow-up needed.      PT Assessment  Patient needs continued PT services    Follow Up Recommendations  No PT follow up    Does the patient have the potential to tolerate intense rehabilitation      Barriers to Discharge Decreased caregiver support lives alone     Equipment Recommendations  None recommended by PT    Recommendations for Other Services     Frequency Min 5X/week    Precautions / Restrictions Restrictions Weight Bearing Restrictions: Yes RLE Weight Bearing: Touchdown weight bearing   Pertinent Vitals/Pain Pt c/o 8/10 pain in foot.        Mobility  Bed Mobility Bed Mobility: Supine to Sit;Sit to Supine Supine to Sit: 7: Independent Sit to Supine: 7: Independent Transfers Transfers: Sit to Stand;Stand to Sit Sit to Stand: 6: Modified independent (Device/Increase time) Stand to Sit: 6: Modified independent (Device/Increase time) Details for Transfer Assistance: cues for tdwb on R LE.  Ambulation/Gait Ambulation/Gait Assistance: 6: Modified independent (Device/Increase time) Ambulation Distance (Feet): 30 Feet Assistive device: Rolling walker Ambulation/Gait Assistance Details: pt demonstrates safety and independence.  Gait Pattern: Step-to pattern;Decreased weight shift to right General Gait Details: Pt TDWB on R heel.  Stairs: No Wheelchair Mobility Wheelchair Mobility: No    Shoulder Instructions     Exercises     PT Diagnosis: Abnormality of gait;Acute pain  PT Problem List: Pain;Decreased mobility PT Treatment Interventions: Gait training   PT Goals Acute Rehab PT Goals PT Goal  Formulation: With patient Time For Goal Achievement: 04/25/12 Potential to Achieve Goals: Good Pt will Ambulate: 16 - 50 feet;with modified independence;with crutches PT Goal: Ambulate - Progress: Goal set today  Visit Information  Last PT Received On: 04/24/12    Subjective Data  Subjective: Agree to PT eval Patient Stated Goal: return to home   Prior Functioning  Home Living Lives With: Alone Available Help at Discharge: Friend(s);Available PRN/intermittently Type of Home: Apartment Home Access: Level entry Home Layout: One level Bathroom Shower/Tub: Tub/shower unit;Curtain Firefighter: Standard Bathroom Accessibility: Yes How Accessible: Accessible via wheelchair;Accessible via walker Home Adaptive Equipment: Bedside commode/3-in-1;Grab bars in shower;Grab bars around toilet;Walker - standard;Wheelchair - powered Prior Function Level of Independence: Independent Able to Take Stairs?: Yes Driving: No Vocation: On disability Communication Communication: No difficulties Dominant Hand: Right    Cognition  Overall Cognitive Status: Appears within functional limits for tasks assessed/performed Arousal/Alertness: Awake/alert Orientation Level: Appears intact for tasks assessed Behavior During Session: [facility] for tasks performed    Extremity/Trunk Assessment Right Upper Extremity Assessment RUE ROM/Strength/Tone: Within functional levels Left Upper Extremity Assessment LUE ROM/Strength/Tone: Within functional levels Right Lower Extremity Assessment RLE ROM/Strength/Tone: Unable to fully assess Left Lower Extremity Assessment LLE ROM/Strength/Tone: Within functional levels   Balance    End of Session PT - End of Session Equipment Utilized During Treatment: Gait belt Activity Tolerance: Patient tolerated treatment well Patient left: in bed;with call bell/phone within reach Nurse Communication: Mobility status  GP Functional Assessment Tool Used: clinical  judgement Functional Limitation: Mobility: Walking and moving around Mobility: Walking and Moving Around Current Status ([id number]): At  least 1 percent but less than 20 percent impaired, limited or restricted Mobility: Walking and Moving Around Goal Status [phone]): 0 percent impaired, limited or restricted   Steven [name] 04/24/2012, 7:28 PM  Steven [name] DPT [phone]

## 2012-04-24 NOTE — Op Note (Signed)
OPERATIVE REPORT  DATE OF SURGERY: 04/24/2012  PATIENT:  Steven [name],  51 y.o. 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 Risks and benefits were discussed including infection neurovascular injury nonhealing of the wound need for higher level amputation. Patient states he understands and wished to proceed at this time. Description of procedure patient brought to operating room underwent a general anesthetic. After adequate levels of anesthesia were obtained patient's right lower family was prepped using DuraPrep and draped into a sterile field. Patient underwent a racquet incision which ellipsed out the ulcer and bone in one block of tissue. The first ray was resected to the base of the first metatarsal medial cuneiforms joint. Electrocautery was used for hemostasis the wound is irrigated with normal saline there is no evidence of abscess no necrotic tissue. The skin was closed using 2-0 nylon. The wound was covered Adaptic orthopedic sponges ABDs dressing Kerlix and Coban. Patient was extubated taken to the PACU in stable condition plan for overnight observation.  PLAN OF CARE: Admit for overnight observation  PATIENT DISPOSITION:  PACU - hemodynamically stable.   Nadara Mustard, MD 04/24/2012 11:09 AM

## 2012-04-24 NOTE — Progress Notes (Signed)
Orthopedic Tech Progress Note Patient Details:  Steven [name] Feb 01, 1961 [id number]  Patient ID: Steven [name], male   DOB: 10/14/1960, 51 y.o.   MRN: [id number] Pt already has post op shoe;rn notified  Nikki Dom 04/24/2012, 2:12 PM

## 2012-04-24 NOTE — Anesthesia Procedure Notes (Signed)
Procedure Name: LMA Insertion Date/Time: 04/24/2012 10:34 AM Performed by: Marena Chancy Pre-anesthesia Checklist: Patient identified, Timeout performed, Emergency Drugs available, Suction available and Patient being monitored Patient Re-evaluated:Patient Re-evaluated prior to inductionOxygen Delivery Method: Circle system utilized Preoxygenation: Pre-oxygenation with 100% oxygen Intubation Type: IV induction LMA: LMA inserted LMA Size: 5.0 Placement Confirmation: positive ETCO2 Tube secured with: Tape Dental Injury: Teeth and Oropharynx as per pre-operative assessment

## 2012-04-24 NOTE — Anesthesia Preprocedure Evaluation (Addendum)
Anesthesia Evaluation  Patient identified by MRN, date of birth, ID band Patient awake    Reviewed: Allergy & Precautions, H&P , NPO status , Patient's Chart, lab work & pertinent test results  Airway Mallampati: II TM Distance: >3 FB Neck ROM: full    Dental No notable dental hx. (+) Edentulous Upper and Edentulous Lower   Pulmonary neg pulmonary ROS,  breath sounds clear to auscultation  Pulmonary exam normal       Cardiovascular hypertension, On Medications negative cardio ROS  Rhythm:regular Rate:Normal     Neuro/Psych  Neuromuscular disease negative neurological ROS  negative psych ROS   GI/Hepatic negative GI ROS, Neg liver ROS,   Endo/Other  negative endocrine ROSdiabetes  Renal/GU negative Renal ROS  negative genitourinary   Musculoskeletal   Abdominal   Peds  Hematology negative hematology ROS (+)   Anesthesia Other Findings   Reproductive/Obstetrics negative OB ROS                           Anesthesia Physical Anesthesia Plan  ASA: III  Anesthesia Plan: General   Post-op Pain Management:    Induction: Intravenous  Airway Management Planned: LMA  Additional Equipment:   Intra-op Plan:   Post-operative Plan: Extubation in OR  Informed Consent: I have reviewed the patients History and Physical, chart, labs and discussed the procedure including the risks, benefits and alternatives for the proposed anesthesia with the patient or authorized representative who has indicated his/her understanding and acceptance.     Plan Discussed with: CRNA and Surgeon  Anesthesia Plan Comments:         Anesthesia Quick Evaluation

## 2012-04-24 NOTE — Anesthesia Postprocedure Evaluation (Signed)
  Anesthesia Post-op Note  Patient: Steven [name]  Procedure(s) Performed: Procedure(s) (LRB) with comments: AMPUTATION RAY (Right) - Right foot 1st ray amputation  Patient Location: PACU  Anesthesia Type:General  Level of Consciousness: awake  Airway and Oxygen Therapy: Patient Spontanous Breathing  Post-op Pain: mild  Post-op Assessment: Post-op Vital signs reviewed, Patient's Cardiovascular Status Stable, Respiratory Function Stable, Patent Airway, No signs of Nausea or vomiting and Pain level controlled  Post-op Vital Signs: stable  Complications: No apparent anesthesia complications

## 2012-04-24 NOTE — H&P (Signed)
Steven [name] is an 51 y.o. male.   Chief Complaint: Osteomyelitis right foot great toe HPI: Patient is a 51 year old gentleman with diabetic insensate  neuropathy who has had a chronic ulcer of the right great toe. Due to failure conservative care chronic ulceration osteomyelitis patient presents at this time for surgical intervention.  Past Medical History  Diagnosis Date  . DM (diabetes mellitus)   . HTN (hypertension)   . History of DVT (deep vein thrombosis)   . Personal history of diabetic foot ulcer   . Peripheral vascular disease   . Peripheral neuropathy 11/19/2011  . Personal history of diabetic foot ulcer   . Insomnia   . Sinus congestion   . Varicose vein     legs    Past Surgical History  Procedure Date  . Amputation 04/21/2011    Procedure: AMPUTATION RAY;  Surgeon: Nadara Mustard, MD;  Location: [facility] OR;  Service: Orthopedics;  Laterality: Right;  Rt foot 2nd ray ampt  . I&d extremity 05/03/2011    Procedure: IRRIGATION AND DEBRIDEMENT EXTREMITY;  Surgeon: Nadara Mustard, MD;  Location: [facility] OR;  Service: Orthopedics;  Laterality: Right;  Irrigation and Debridement Right Foot and Place Acell Xenograft    Family History  Problem Relation Age of Onset  . Diabetes Mother   . Stroke Brother    Social History:  reports that he has been smoking Cigarettes.  He has a 15 pack-year smoking history. He has never used smokeless tobacco. He reports that he does not drink alcohol or use illicit drugs.  Allergies: No Known Allergies  No prescriptions prior to admission    No results found for this or any previous visit (from the past 48 hour(s)). No results found.  Review of Systems  All other systems reviewed and are negative.    There were no vitals taken for this visit. Physical Exam  On examination patient is a palpable dorsalis pedis pulse there is ulceration abscess osteomyelitis of the right great toe. Assessment/Plan Assessment: Osteomyelitis abscess right great  toe.  Plan: We'll plan for right foot first ray amputation. Risks and benefits were discussed including infection neurovascular injury nonhealing of the wound need for additional surgery. Patient states he understands and wished to proceed at this time.  Steven [name] 04/24/2012, 6:43 AM

## 2012-04-24 NOTE — Preoperative (Signed)
Beta Blockers   Reason not to administer Beta Blockers:Not Applicable 

## 2012-04-25 ENCOUNTER — Encounter (HOSPITAL_COMMUNITY): Payer: Self-pay | Admitting: General Practice

## 2012-04-25 LAB — GLUCOSE, CAPILLARY: Glucose-Capillary: 164 mg/dL — ABNORMAL HIGH (ref 70–99)

## 2012-04-25 MED ORDER — OXYCODONE-ACETAMINOPHEN 5-325 MG PO TABS: 1.0000 | ORAL_TABLET | ORAL | Status: DC | PRN

## 2012-04-25 NOTE — Progress Notes (Signed)
Orthopedic Tech Progress Note Patient Details:  Steven [name] 04-30-1961 [id number] Crutches ordered for discharge for home use. Fit to previous sizing with PT crutches. Ortho Devices Type of Ortho Device: Crutches Ortho Device/Splint Interventions: Ordered   [place] 04/25/2012, 9:42 AM

## 2012-04-25 NOTE — Discharge Summary (Signed)
Physician Discharge Summary  Patient ID: Steven [name] MRN: [id number] DOB/AGE: [birth year] 51 y.o.  Admit date: 04/24/2012 Discharge date: 04/25/2012  Admission Diagnoses: Osteomyelitis with ulceration right foot first metatarsal Discharge Diagnoses: Same Active Problems:  * No active hospital problems. *    Discharged Condition: stable  Hospital Course: Patient's hospital course was essentially unremarkable. He underwent a right foot first ray amputation. Postoperatively patient was stable and he was discharged to home in stable condition.  Consults: None  Significant Diagnostic Studies: labs: Routine labs  Treatments: surgery: See operative note  Discharge Exam: Blood pressure 116/66, pulse 70, temperature 98 F (36.7 C), temperature source Oral, resp. rate 16, SpO2 100.00%. Incision/Wound: dressing clean and dry  Disposition: 01-Home or Self Care  Discharge Orders    Future Orders Please Complete By Expires   Diet - low sodium heart healthy      Call MD / Call 911      Comments:   If you experience chest pain or shortness of breath, CALL 911 and be transported to the hospital emergency room.  If you develope a fever above 101 F, pus (white drainage) or increased drainage or redness at the wound, or calf pain, call your surgeon's office.   Constipation Prevention      Comments:   Drink plenty of fluids.  Prune juice may be helpful.  You may use a stool softener, such as Colace (over the counter) 100 mg twice a day.  Use MiraLax (over the counter) for constipation as needed.   Increase activity slowly as tolerated          Medication List     As of 04/25/2012  6:24 AM    TAKE these medications         fluticasone 50 MCG/ACT nasal spray   Commonly known as: FLONASE   Place 2 sprays into the nose daily.      gabapentin 300 MG capsule   Commonly known as: NEURONTIN   Take 1 capsule (300 mg total) by mouth 3 (three) times daily.      glipiZIDE 5 MG tablet   Commonly known as: GLUCOTROL   Take 1 tablet (5 mg total) by mouth 2 (two) times daily before a meal.      lisinopril 20 MG tablet   Commonly known as: PRINIVIL,ZESTRIL   Take 1 tablet (20 mg total) by mouth daily.      metFORMIN 1000 MG tablet   Commonly known as: GLUCOPHAGE   TAKE ONE TABLET BY MOUTH TWICE DAILY WITH MEALS      oxyCODONE-acetaminophen 5-325 MG per tablet   Commonly known as: PERCOCET/ROXICET   Take 1 tablet by mouth every 4 (four) hours as needed for pain.           Follow-up Information    Follow up with Steven Lesperance V, MD. In 1 week.   Contact information:   [address] [place] [address] [phone]          Signed: Nadara [name] 04/25/2012, 6:24 AM

## 2012-04-25 NOTE — Progress Notes (Signed)
Physical Therapy Treatment Patient Details Name: Steven [name] MRN: [id number] DOB: [birth year] Today's Date: 04/25/2012 Time: 0835-0900 PT Time Calculation (min): 25 min  PT Assessment / Plan / Recommendation  Mobility  Bed Mobility Bed Mobility: Supine to Sit;Sit to Supine Supine to Sit: 7: Independent Sit to Supine: 7: Independent Transfers Transfers: Sit to Stand;Stand to Sit Sit to Stand: 7: Independent Stand to Sit: 7: Independent;To bed Ambulation/Gait Ambulation/Gait Assistance: 6: Modified independent (Device/Increase time) Ambulation Distance (Feet): 80 Feet Assistive device: Crutches Ambulation/Gait Assistance Details: cues for sequencing with crutches and WB on hands vs armpits.   Gait Pattern: Step-to pattern;Decreased weight shift to right General Gait Details: Pt TDWB on R heel.  Stairs: No Wheelchair Mobility Wheelchair Mobility: No    PT Diagnosis:    PT Problem List:   PT Treatment Interventions:     PT Goals Acute Rehab PT Goals PT Goal Formulation: With patient Time For Goal Achievement: 04/25/12 Potential to Achieve Goals: Good Pt will Ambulate: 16 - 50 feet;with modified independence;with crutches PT Goal: Ambulate - Progress: Met  Visit Information  Last PT Received On: 04/25/12 Assistance Needed: +1    Subjective Data  Subjective: I feel alright Patient Stated Goal: return to home   Cognition  Overall Cognitive Status: Appears within functional limits for tasks assessed/performed Arousal/Alertness: Awake/alert Orientation Level: Appears intact for tasks assessed Behavior During Session: [facility] for tasks performed    Balance     End of Session PT - End of Session Equipment Utilized During Treatment: Gait belt Activity Tolerance: Patient tolerated treatment well Patient left: in bed;with call bell/phone within reach Nurse Communication: Mobility status   GP Functional Assessment Tool Used: clinical judgement Functional Limitation:  Mobility: Walking and moving around Mobility: Walking and Moving Around Current Status [phone]): 0 percent impaired, limited or restricted Mobility: Walking and Moving Around Goal Status [phone]): 0 percent impaired, limited or restricted Mobility: Walking and Moving Around Discharge Status [phone]): 0 percent impaired, limited or restricted   Nasser Ku 04/25/2012, 9:15 AM Theron Arista L. Soriyah Osberg DPT [phone]

## 2012-04-25 NOTE — Progress Notes (Signed)
Orthopedic Tech Progress Note Patient Details:  Steven [name] 11-09-1960 [id number] Patient already had post op shoe from home Ortho Devices Type of Ortho Device: Postop boot   [name] 04/25/2012, 9:42 AM

## 2012-05-27 ENCOUNTER — Other Ambulatory Visit: Payer: Self-pay | Admitting: *Deleted

## 2012-05-28 MED ORDER — METFORMIN HCL 1000 MG PO TABS: 1000.0000 mg | ORAL_TABLET | Freq: Two times a day (BID) | ORAL | Status: DC

## 2012-06-07 ENCOUNTER — Other Ambulatory Visit: Payer: Self-pay | Admitting: *Deleted

## 2012-06-07 DIAGNOSIS — I10 Essential (primary) hypertension: Secondary | ICD-10-CM

## 2012-06-08 MED ORDER — LISINOPRIL 20 MG PO TABS: 20.0000 mg | ORAL_TABLET | Freq: Every day | ORAL | Status: DC

## 2012-06-13 ENCOUNTER — Ambulatory Visit: Payer: Medicaid Other | Admitting: Family Medicine

## 2012-06-20 ENCOUNTER — Other Ambulatory Visit: Payer: Self-pay | Admitting: *Deleted

## 2012-06-20 DIAGNOSIS — M25519 Pain in unspecified shoulder: Secondary | ICD-10-CM

## 2012-06-20 MED ORDER — GABAPENTIN 300 MG PO CAPS: 300.0000 mg | ORAL_CAPSULE | Freq: Three times a day (TID) | ORAL | Status: DC

## 2012-08-12 ENCOUNTER — Other Ambulatory Visit: Payer: Self-pay | Admitting: *Deleted

## 2012-08-12 DIAGNOSIS — I10 Essential (primary) hypertension: Secondary | ICD-10-CM

## 2012-08-12 MED ORDER — LISINOPRIL 20 MG PO TABS: 20.0000 mg | ORAL_TABLET | Freq: Every day | ORAL | Status: DC

## 2012-08-30 ENCOUNTER — Telehealth: Payer: Self-pay | Admitting: Family Medicine

## 2012-08-30 ENCOUNTER — Emergency Department (HOSPITAL_COMMUNITY): Payer: Medicaid Other

## 2012-08-30 ENCOUNTER — Inpatient Hospital Stay (HOSPITAL_COMMUNITY)
Admission: EM | Admit: 2012-08-30 | Discharge: 2012-09-03 | DRG: 638 | Disposition: A | Discharge status: Home / Self Care | Payer: Medicaid Other | Attending: Family Medicine | Admitting: Family Medicine

## 2012-08-30 ENCOUNTER — Encounter (HOSPITAL_COMMUNITY): Payer: Self-pay | Admitting: *Deleted

## 2012-08-30 DIAGNOSIS — K5900 Constipation, unspecified: Secondary | ICD-10-CM | POA: Diagnosis present

## 2012-08-30 DIAGNOSIS — L02619 Cutaneous abscess of unspecified foot: Secondary | ICD-10-CM | POA: Diagnosis present

## 2012-08-30 DIAGNOSIS — E1165 Type 2 diabetes mellitus with hyperglycemia: Secondary | ICD-10-CM

## 2012-08-30 DIAGNOSIS — I872 Venous insufficiency (chronic) (peripheral): Secondary | ICD-10-CM

## 2012-08-30 DIAGNOSIS — Z79899 Other long term (current) drug therapy: Secondary | ICD-10-CM

## 2012-08-30 DIAGNOSIS — M869 Osteomyelitis, unspecified: Secondary | ICD-10-CM

## 2012-08-30 DIAGNOSIS — G609 Hereditary and idiopathic neuropathy, unspecified: Secondary | ICD-10-CM

## 2012-08-30 DIAGNOSIS — M9080 Osteopathy in diseases classified elsewhere, unspecified site: Secondary | ICD-10-CM | POA: Diagnosis present

## 2012-08-30 DIAGNOSIS — IMO0002 Reserved for concepts with insufficient information to code with codable children: Principal | ICD-10-CM

## 2012-08-30 DIAGNOSIS — E1142 Type 2 diabetes mellitus with diabetic polyneuropathy: Secondary | ICD-10-CM | POA: Diagnosis present

## 2012-08-30 DIAGNOSIS — Z86718 Personal history of other venous thrombosis and embolism: Secondary | ICD-10-CM

## 2012-08-30 DIAGNOSIS — E1149 Type 2 diabetes mellitus with other diabetic neurological complication: Secondary | ICD-10-CM | POA: Diagnosis present

## 2012-08-30 DIAGNOSIS — S98139A Complete traumatic amputation of one unspecified lesser toe, initial encounter: Secondary | ICD-10-CM

## 2012-08-30 DIAGNOSIS — F17200 Nicotine dependence, unspecified, uncomplicated: Secondary | ICD-10-CM | POA: Diagnosis present

## 2012-08-30 DIAGNOSIS — F1010 Alcohol abuse, uncomplicated: Secondary | ICD-10-CM

## 2012-08-30 DIAGNOSIS — G629 Polyneuropathy, unspecified: Secondary | ICD-10-CM

## 2012-08-30 DIAGNOSIS — M86171 Other acute osteomyelitis, right ankle and foot: Secondary | ICD-10-CM

## 2012-08-30 DIAGNOSIS — I739 Peripheral vascular disease, unspecified: Secondary | ICD-10-CM | POA: Diagnosis present

## 2012-08-30 DIAGNOSIS — R197 Diarrhea, unspecified: Secondary | ICD-10-CM

## 2012-08-30 DIAGNOSIS — I10 Essential (primary) hypertension: Secondary | ICD-10-CM

## 2012-08-30 HISTORY — DX: Other acute osteomyelitis, right ankle and foot: M86.171

## 2012-08-30 LAB — COMPREHENSIVE METABOLIC PANEL
ALT: 10 U/L (ref 0–53)
AST: 13 U/L (ref 0–37)
Alkaline Phosphatase: 85 U/L (ref 39–117)
CO2: 22 mEq/L (ref 19–32)
Chloride: 95 mEq/L — ABNORMAL LOW (ref 96–112)
Creatinine, Ser: 0.8 mg/dL (ref 0.50–1.35)
GFR calc non Af Amer: >90 mL/min (ref >90)
Potassium: 3.5 mEq/L (ref 3.5–5.1)
Total Bilirubin: 0.3 mg/dL (ref 0.3–1.2)

## 2012-08-30 LAB — CBC
Hemoglobin: 15 g/dL (ref 13.0–17.0)
MCH: 28.6 pg (ref 26.0–34.0)
MCHC: 34.4 g/dL (ref 30.0–36.0)
MCV: 80.3 fL (ref 78.0–100.0)
MCV: 83 fL (ref 78.0–100.0)
Platelets: 170 10*3/uL (ref 150–400)
Platelets: 163 10*3/uL (ref 150–400)
RBC: 5.47 MIL/uL (ref 4.22–5.81)
RBC: 5.25 MIL/uL (ref 4.22–5.81)
WBC: 6.2 10*3/uL (ref 4.0–10.5)

## 2012-08-30 LAB — GLUCOSE, CAPILLARY: Glucose-Capillary: 95 mg/dL (ref 70–99)

## 2012-08-30 LAB — CREATININE, SERUM: Creatinine, Ser: 0.97 mg/dL (ref 0.50–1.35)

## 2012-08-30 MED ORDER — PIPERACILLIN-TAZOBACTAM 3.375 G IVPB 30 MIN
3.3750 g | Freq: Once | INTRAVENOUS | Status: AC
  Administered 2012-08-30: 3.375 g via INTRAVENOUS
  Filled 2012-08-30: qty 50

## 2012-08-30 MED ORDER — TRAZODONE 25 MG HALF TABLET
25.0000 mg | ORAL_TABLET | Freq: Every evening | ORAL | Status: DC | PRN
  Administered 2012-08-31 – 2012-09-02 (×4): 25 mg via ORAL
  Filled 2012-08-30 (×4): qty 1

## 2012-08-30 MED ORDER — INSULIN ASPART 100 UNIT/ML ~~LOC~~ SOLN
0.0000 [IU] | Freq: Every day | SUBCUTANEOUS | Status: DC
  Administered 2012-08-31: 2 [IU] via SUBCUTANEOUS
  Administered 2012-09-01: 3 [IU] via SUBCUTANEOUS
  Administered 2012-09-02: 5 [IU] via SUBCUTANEOUS

## 2012-08-30 MED ORDER — INSULIN ASPART 100 UNIT/ML ~~LOC~~ SOLN
10.0000 [IU] | Freq: Once | SUBCUTANEOUS | Status: AC
  Administered 2012-08-30: 10 [IU] via SUBCUTANEOUS
  Filled 2012-08-30: qty 1

## 2012-08-30 MED ORDER — SODIUM CHLORIDE 0.9 % IV SOLN
INTRAVENOUS | Status: DC
  Administered 2012-08-31 – 2012-09-03 (×3): via INTRAVENOUS

## 2012-08-30 MED ORDER — VANCOMYCIN HCL 10 G IV SOLR
1500.0000 mg | Freq: Two times a day (BID) | INTRAVENOUS | Status: DC
  Administered 2012-08-30 – 2012-09-02 (×6): 1500 mg via INTRAVENOUS
  Filled 2012-08-30 (×7): qty 1500

## 2012-08-30 MED ORDER — LISINOPRIL 20 MG PO TABS
20.0000 mg | ORAL_TABLET | Freq: Every day | ORAL | Status: DC
  Administered 2012-08-30 – 2012-09-03 (×5): 20 mg via ORAL
  Filled 2012-08-30 (×5): qty 1

## 2012-08-30 MED ORDER — GABAPENTIN 300 MG PO CAPS
300.0000 mg | ORAL_CAPSULE | Freq: Three times a day (TID) | ORAL | Status: DC
  Administered 2012-08-30 – 2012-09-03 (×11): 300 mg via ORAL
  Filled 2012-08-30 (×13): qty 1

## 2012-08-30 MED ORDER — PIPERACILLIN-TAZOBACTAM 3.375 G IVPB
3.3750 g | Freq: Three times a day (TID) | INTRAVENOUS | Status: DC
  Administered 2012-08-30 – 2012-09-03 (×11): 3.375 g via INTRAVENOUS
  Filled 2012-08-30 (×15): qty 50

## 2012-08-30 MED ORDER — ONDANSETRON HCL 4 MG/2ML IJ SOLN: 4.0000 mg | Freq: Four times a day (QID) | INTRAMUSCULAR | Status: DC | PRN

## 2012-08-30 MED ORDER — ONDANSETRON HCL 4 MG PO TABS: 4.0000 mg | ORAL_TABLET | Freq: Four times a day (QID) | ORAL | Status: DC | PRN

## 2012-08-30 MED ORDER — ACETAMINOPHEN 325 MG PO TABS: 650.0000 mg | ORAL_TABLET | Freq: Four times a day (QID) | ORAL | Status: DC | PRN

## 2012-08-30 MED ORDER — VANCOMYCIN HCL IN DEXTROSE 1-5 GM/200ML-% IV SOLN
1000.0000 mg | Freq: Once | INTRAVENOUS | Status: AC
  Administered 2012-08-30: 1000 mg via INTRAVENOUS
  Filled 2012-08-30: qty 200

## 2012-08-30 MED ORDER — INSULIN REGULAR HUMAN 100 UNIT/ML IJ SOLN
10.0000 [IU] | Freq: Once | INTRAMUSCULAR | Status: DC
  Filled 2012-08-30: qty 0.1

## 2012-08-30 MED ORDER — HEPARIN SODIUM (PORCINE) 5000 UNIT/ML IJ SOLN
5000.0000 [IU] | Freq: Three times a day (TID) | INTRAMUSCULAR | Status: DC
  Administered 2012-08-30 – 2012-09-03 (×11): 5000 [IU] via SUBCUTANEOUS
  Filled 2012-08-30 (×14): qty 1

## 2012-08-30 MED ORDER — HYDROCODONE-ACETAMINOPHEN 5-325 MG PO TABS
1.0000 | ORAL_TABLET | Freq: Three times a day (TID) | ORAL | Status: DC | PRN
  Administered 2012-08-31 – 2012-09-03 (×7): 1 via ORAL
  Filled 2012-08-30 (×6): qty 1
  Filled 2012-08-30: qty 2
  Filled 2012-08-30 (×2): qty 1

## 2012-08-30 MED ORDER — ACETAMINOPHEN 650 MG RE SUPP: 650.0000 mg | Freq: Four times a day (QID) | RECTAL | Status: DC | PRN

## 2012-08-30 MED ORDER — INSULIN ASPART 100 UNIT/ML ~~LOC~~ SOLN
0.0000 [IU] | Freq: Three times a day (TID) | SUBCUTANEOUS | Status: DC
  Administered 2012-08-31 (×2): 3 [IU] via SUBCUTANEOUS
  Administered 2012-08-31: 9 [IU] via SUBCUTANEOUS
  Administered 2012-09-01: 3 [IU] via SUBCUTANEOUS

## 2012-08-30 NOTE — H&P (Signed)
Family Medicine Teaching [facility] Admission History and Physical Service Pager: [phone]  Patient name: Steven [name] Medical record number: [id number] Date of birth: August 01, 1960 Age: 52 y.o. Gender: male  Primary Care Provider: Chiana Wamser, DO  Chief Complaint: Foot ulceration  Assessment and Plan: Steven [name] is a 52 y.o. 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  Xray revealed osteomyelitis of the fifth metatarsal head and fifth metatarsal base.  # Osteomyeliltis - No leukocytosis or systemic signs of infection.  - Will admit to inpatient, Med Surg - Patient started on empiric Vanc and Zosyn in the ED.  Will continue (pharmacy to dose) - Obtaining MRI for further evaluation for deep abscess that may require surgical drainage. - Orthopedics consulted.  Patient will likely need further intervention with debridement with potential further amputation.  He has 1+ distal pulses but can consider ABI to see to what extent an amputation would be needed.  He does not desire surgical intervention but will consider if needed.  - Will monitor closely during admission.  # DM-2, uncontrolled with peripheral neuropathy - Will continue home Gabapentin. - SSI, sensitive with QHS coverage.  May need to increase in the setting of infection.  - CBG's TID AC and QHS - Obtaining A1C (last A1C was 10/2011) - He has very poor understanding of his disease and further consequences of not caring for himself. Will have diabetes educator see.   # HTN  - Continuing home lisinopril 20 mg, can consider increased dose if BP remains elevated.   # Alcohol abuse - CIWA protocol -I have talked with him about cutting back on multiple occasions but he remains ambiguous in actually doing this.   FEN/GI: NS @ 100 mL/hr; Carb modified  diet Prophylaxis: Heparin SQ Disposition: Pending clinical improvement Code Status: Full code  History of Present Illness:  Steven [name] is a 52 y.o. 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  Patient reports that he has had developing foot ulcers on the lateral aspect of his foot x 1 month.  He states that they have been draining and foul smelling.   At the request of his home health nurse, he presented today for further evaluation.  He denies any fevers, chills, night sweats. He also denies chest pain, SOB.  Of note, he reports recent elevated blood sugars at home (most recent being 400).  ROS: Denies fevers, chills, N/V, diarrhea, chest pain, SOB.  Dizziness reported yesterday.  Patient Active Problem List  Diagnosis  . ONYCHOMYCOSIS, TOENAILS  . DM  . VENOUS INSUFFICIENCY  . CALLUSES, FEET, BILATERAL  . HX, PERSONAL, HEALTH HAZARDS NEC  . HTN (hypertension)  . Shoulder pain  . ETOH abuse  . Sleep disturbance  . Generalized pustular eruption  . Blister  . Peripheral neuropathy  . Diarrhea  . Pancreatitis   Past Medical History: Past Medical History  Diagnosis Date  . DM (diabetes mellitus)   . HTN (hypertension)   . History of DVT (deep vein thrombosis)   . Personal history of diabetic foot ulcer   . Peripheral vascular disease   . Peripheral neuropathy 11/19/2011  . Personal history of diabetic foot ulcer   . Insomnia   .  Sinus congestion   . Varicose vein     legs   Past Surgical History: Past Surgical History  Procedure Laterality Date  . Amputation  04/21/2011    Procedure: AMPUTATION RAY;  Surgeon: Nadara Mustard, MD;  Location: [facility] OR;  Service: Orthopedics;  Laterality: Right;  Rt foot 2nd ray ampt  . I&d extremity  05/03/2011    Procedure: IRRIGATION AND DEBRIDEMENT EXTREMITY;  Surgeon: Nadara Mustard, MD;  Location:  [facility] OR;  Service: Orthopedics;  Laterality: Right;  Irrigation and Debridement Right Foot and Place Acell Xenograft  . Toe amputation  04/24/2012    great toe   right foot  . Amputation  04/24/2012    Procedure: AMPUTATION RAY;  Surgeon: Nadara Mustard, MD;  Location: [facility] OR;  Service: Orthopedics;  Laterality: Right;  Right foot 1st ray amputation   Social History: History  Substance Use Topics  . Smoking status: Current Every Day Smoker -- 0.50 packs/day for 30 years    Types: Cigarettes  . Smokeless tobacco: Never Used  . Alcohol Use: No     Comment: t states he hs rank any alcholol rcently   For any additional social history documentation, please refer to relevant sections of EMR.  Family History: Family History  Problem Relation Age of Onset  . Diabetes Mother   . Stroke Brother    Allergies: No Known Allergies No current facility-administered medications on file prior to encounter.   Current Outpatient Prescriptions on File Prior to Encounter  Medication Sig Dispense Refill  . glipiZIDE (GLUCOTROL) 5 MG tablet Take 1 tablet (5 mg total) by mouth 2 (two) times daily before a meal.  60 tablet  2  . fluticasone (FLONASE) 50 MCG/ACT nasal spray Place 2 sprays into the nose daily.  16 g  2   Review Of Systems: Per HPI. Otherwise 12 point review of systems was performed and was unremarkable.  Physical Exam: BP 126/82  Pulse 82  Temp(Src) 98 F (36.7 C) (Oral)  Resp 20  SpO2 97% Exam: General: chronically ill appearing gentlemen, resting comfortably in bed. NAD. HEENT: NCAT.  Cardiovascular: RRR. No murmurs, rubs, or gallops. Respiratory: CTAB. No rales, rhonchi, or wheezing. Abdomen: obese, soft, nontender, nondistended. Extremities: Right foot - s/p amputation of 1st and 2nd metatarsals and digits.  Approximately 4 cm area of ulceration noted on medial aspect - appears to be healing well, no drainage/discharged noted.  On  The lateral aspect, 2 dime sized ulcerations  noted with purulent discharge.  These are tender to palpation.  1+ DP and PT pulses. Skin: No rashes noted. Neuro: AO x 3. No focal deficits.  Labs and Imaging: CBC BMET   Recent Labs Lab 08/30/12 1220  WBC 6.2  HGB 15.6  HCT 43.9  PLT 170    Recent Labs Lab 08/30/12 1220  NA 130*  K 3.5  CL 95*  CO2 22  BUN 12  CREATININE 0.80  GLUCOSE 350*  CALCIUM 9.6     Dg Foot Complete Right 08/30/2012   IMPRESSION:  Open skin ulcers on the lateral aspect of the foot with underlying osteomyelitis involving the fifth metatarsal head and fifth metatarsal base.  Everlene Other, DO 08/30/2012, 4:43 PM  PGY-3 Addendum  I have seen and evaluated the patient along with Dr. Adriana Simas and agree with the note as outlined above with any changes or additions made in [place].

## 2012-08-30 NOTE — ED Notes (Signed)
Phlebotomy at bedside.

## 2012-08-30 NOTE — Telephone Encounter (Signed)
Nurse is calling because the patient is having a problem with his foot.  According to the aide, he is wrapping it himself and it is really raw, his toe is falling off but he is keeping it on by wrapping it up.  They are trying to convince the patient to come in to be seen but after speaking to [place], I told them to call 911 for him to go to the hospital.

## 2012-08-30 NOTE — ED Notes (Signed)
Pt's CBG is 348 mg/dl.RN [facility] notified.

## 2012-08-30 NOTE — Telephone Encounter (Signed)
Fwd. To PCP for info .Steven [name]  

## 2012-08-30 NOTE — ED Notes (Signed)
Pt receives wound care to right foot, called ems due to increase in swelling today and foul odor to foot. Bandaged pta cbg 419.

## 2012-08-30 NOTE — ED Provider Notes (Signed)
History     CSN: [id number]  Arrival date & time 08/30/12  1123   First MD Initiated Contact with Patient 08/30/12 1300      Chief Complaint  Patient presents with  . Foot Pain  . Hyperglycemia    (Consider location/radiation/quality/duration/timing/severity/associated sxs/prior treatment) HPI Comments: Patient presents with wounds to his right foot. He status post a partial amputation of his right foot in November of last year due  to a nonhealing ulcer. He has underlying diabetic neuropathy. He states that since his surgery he's had the wound that hasn't healed. His home health aide noticed some foul-smelling discharge to home today which is why he came over. He's also had 2 ulcers on the lateral aspect of his foot that started within the last week. He's noted some drainage from these areas with increased pain to the areas. He denies he fevers or chills. He denies any recent illnesses. He has constant throbbing pain to ulcer on his foot  Patient is a 52 y.o. male presenting with lower extremity pain.  Foot Pain Pertinent negatives include no chest pain, no abdominal pain, no headaches and no shortness of breath.    Past Medical History  Diagnosis Date  . DM (diabetes mellitus)   . HTN (hypertension)   . History of DVT (deep vein thrombosis)   . Personal history of diabetic foot ulcer   . Peripheral vascular disease   . Peripheral neuropathy 11/19/2011  . Personal history of diabetic foot ulcer   . Insomnia   . Sinus congestion   . Varicose vein     legs    Past Surgical History  Procedure Laterality Date  . Amputation  04/21/2011    Procedure: AMPUTATION RAY;  Surgeon: Nadara Mustard, MD;  Location: [facility] OR;  Service: Orthopedics;  Laterality: Right;  Rt foot 2nd ray ampt  . I&d extremity  05/03/2011    Procedure: IRRIGATION AND DEBRIDEMENT EXTREMITY;  Surgeon: Nadara Mustard, MD;  Location: [facility] OR;  Service: Orthopedics;  Laterality: Right;  Irrigation and Debridement Right  Foot and Place Acell Xenograft  . Toe amputation  04/24/2012    great toe   right foot  . Amputation  04/24/2012    Procedure: AMPUTATION RAY;  Surgeon: Nadara Mustard, MD;  Location: [facility] OR;  Service: Orthopedics;  Laterality: Right;  Right foot 1st ray amputation    Family History  Problem Relation Age of Onset  . Diabetes Mother   . Stroke Brother     History  Substance Use Topics  . Smoking status: Current Every Day Smoker -- 0.50 packs/day for 30 years    Types: Cigarettes  . Smokeless tobacco: Never Used  . Alcohol Use: No     Comment: t states he hs rank any alcholol rcently      Review of Systems  Constitutional: Negative for fever, chills, diaphoresis and fatigue.  HENT: Negative for congestion, rhinorrhea and sneezing.   Eyes: Negative.   Respiratory: Negative for cough, chest tightness and shortness of breath.   Cardiovascular: Negative for chest pain and leg swelling.  Gastrointestinal: Negative for nausea, vomiting, abdominal pain, diarrhea and blood in stool.  Genitourinary: Negative for frequency, hematuria, flank pain and difficulty urinating.  Musculoskeletal: Negative for back pain and arthralgias.       Foot ulcers  Skin: Negative for rash.  Neurological: Negative for dizziness, speech difficulty, weakness, numbness and headaches.    Allergies  Review of patient's allergies indicates no known allergies.  Home Medications   Current Outpatient Rx  Name  Route  Sig  Dispense  Refill  . diphenhydrAMINE (BENADRYL) 25 mg capsule   Oral   Take 25 mg by mouth at bedtime as needed for sleep.         Marland Kitchen doxycycline (VIBRAMYCIN) 100 MG capsule   Oral   Take 100 mg by mouth 2 (two) times daily.         . fluticasone (FLONASE) 50 MCG/ACT nasal spray   Nasal   Place 2 sprays into the nose daily.         Marland Kitchen gabapentin (NEURONTIN) 300 MG capsule   Oral   Take 300 mg by mouth 3 (three) times daily.         Marland Kitchen glipiZIDE (GLUCOTROL) 5 MG tablet    Oral   Take 1 tablet (5 mg total) by mouth 2 (two) times daily before a meal.   60 tablet   2   . lisinopril (PRINIVIL,ZESTRIL) 20 MG tablet   Oral   Take 20 mg by mouth daily. Needs appointment for further refills         . EXPIRED: fluticasone (FLONASE) 50 MCG/ACT nasal spray   Nasal   Place 2 sprays into the nose daily.   16 g   2     BP 126/82  Pulse 82  Temp(Src) 98 F (36.7 C) (Oral)  Resp 20  SpO2 97%  Physical Exam  Constitutional: He is oriented to person, place, and time. He appears well-developed and well-nourished.  HENT:  Head: Normocephalic and atraumatic.  Eyes: Pupils are equal, round, and reactive to light.  Neck: Normal range of motion. Neck supple.  Cardiovascular: Normal rate, regular rhythm and normal heart sounds.   Pulmonary/Chest: Effort normal and breath sounds normal. No respiratory distress. He has no wheezes. He has no rales. He exhibits no tenderness.  Abdominal: Soft. Bowel sounds are normal. There is no tenderness. There is no rebound and no guarding.  Musculoskeletal: Normal range of motion. He exhibits no edema.  Patient is status post amputation of the first and second metatarsals and digits of the right foot. He has a 3-4 cm ulcerated area on the medial aspect of the foot with some slight yellowish discharge. There is some mild erythema to the dorsum of the foot but no extension of the foot there is no fluctuance or induration. There's 2 smaller 1 cm ulcerated areas on the lateral aspect of the right foot with some slight yellowish discharge. The more proximal wound is tender on palpation but there is no underlying induration or fluctuance.  Lymphadenopathy:    He has no cervical adenopathy.  Neurological: He is alert and oriented to person, place, and time.  Skin: Skin is warm and dry. No rash noted.  Psychiatric: He has a normal mood and affect.    ED Course  Procedures (including critical care time)  Results for orders placed during  the hospital encounter of 08/30/12  CBC      Result Value Range   WBC 6.2  4.0 - 10.5 K/uL   RBC 5.47  4.22 - 5.81 MIL/uL   Hemoglobin 15.6  13.0 - 17.0 g/dL   HCT 16.1  09.6 - 04.5 %   MCV 80.3  78.0 - 100.0 fL   MCH 28.5  26.0 - 34.0 pg   MCHC 35.5  30.0 - 36.0 g/dL   RDW 40.9  81.1 - 91.4 %   Platelets 170  150 -  400 K/uL  COMPREHENSIVE METABOLIC PANEL      Result Value Range   Sodium 130 (*) 135 - 145 mEq/L   Potassium 3.5  3.5 - 5.1 mEq/L   Chloride 95 (*) 96 - 112 mEq/L   CO2 22  19 - 32 mEq/L   Glucose, Bld 350 (*) 70 - 99 mg/dL   BUN 12  6 - 23 mg/dL   Creatinine, Ser 4.40  0.50 - 1.35 mg/dL   Calcium 9.6  8.4 - 10.2 mg/dL   Total Protein 7.2  6.0 - 8.3 g/dL   Albumin 3.4 (*) 3.5 - 5.2 g/dL   AST 13  0 - 37 U/L   ALT 10  0 - 53 U/L   Alkaline Phosphatase 85  39 - 117 U/L   Total Bilirubin 0.3  0.3 - 1.2 mg/dL   GFR calc non Af Amer >90  >90 mL/min   GFR calc Af Amer >90  >90 mL/min  GLUCOSE, CAPILLARY      Result Value Range   Glucose-Capillary 373 (*) 70 - 99 mg/dL  GLUCOSE, CAPILLARY      Result Value Range   Glucose-Capillary 348 (*) 70 - 99 mg/dL   Comment 1 Notify RN     Comment 2 Documented in Chart     Dg Foot Complete Right  08/30/2012  *RADIOLOGY REPORT*  Clinical Data: Right foot pain. Status post amputation.  RIGHT FOOT COMPLETE - 3+ VIEW  Comparison: 04/17/2011.  Findings: Since the prior x-rays the first ray has been resected and part of the second ray has been amputated.  There is diffuse soft tissue swelling/edema involving the forefoot. Small pockets of gas are noted in the subcutaneous tissues overlying the fifth metatarsal base and head likely due to skin ulcerations.  There is underlying osteomyelitis in both of these areas.  IMPRESSION:  Open skin ulcers on the lateral aspect of the foot with underlying osteomyelitis involving the fifth metatarsal head and fifth metatarsal base   Original Report Authenticated By: Rudie Meyer, M.D.       1.  Osteomyelitis       MDM  Patient osteomyelitis of the fifth metatarsal. He is nonseptic appearing. He was started on IV antibiotics including Zosyn and vancomycin. I did discuss the patient with the on-call orthopedist with [facility] orthopedics to request that we consult his primary care physician for admission. I did consult the family practice service for admission and they are coming to see the patient.        Rolan Bucco, MD 08/30/12 [phone]

## 2012-08-30 NOTE — Consult Note (Signed)
Reason for Consult:Right foot osteomyelitis. Referring Physician: Dr. Adriana Simas Consulting Physician:Razia Screws E  Orthopedic Diagnosis:1)Right lateral foot diabetic ulcers with cellulitis and underlying osteomyelitis of the  5 metatarsal base and head. 2) Diabetes oral agent controlled out of control today, with peripheral neuropathy.  3) History of DVT 4)Peripheral vascular disease, likely small vessel.  ZOX:WRUEA A Steven [name] is an 52 y.o. male.with history of right foot diabetic ulcers. Underwent right 2nd ray amputation after serial debridements in 04/2011. In 04/2012 developed a right foot medial abscess with great toe osteomyelitis and a right great toe Amputation was performed. Last seen by Dr. Lajoyce Corners 2/16 in his office with some persistent open wounds over the right medial Foot. Presented today with increased glucose and a swollen warm right foot from the mid right foot to the remaining 3rd through 5 Metatarsophalangeal joint. Has 2 ulcers over the lateral right foot distal lateral 5th ray and proximal right 5th metatarsal.These Are draining but also have good granulation. Xray showed gas in the soft tissue over the lateral right foot consistent with the open Ulcers and radiographic signs of osteomyelitis of the right 5th MT base and head. Dr. Audrie Lia group was consulted due to his Previous treatment of Mr. Verrette since 2012.  No fever, no chills. Pain of the right foot.   Past Medical History  Diagnosis Date  . DM (diabetes mellitus)   . HTN (hypertension)   . History of DVT (deep vein thrombosis)   . Personal history of diabetic foot ulcer   . Peripheral vascular disease   . Peripheral neuropathy 11/19/2011  . Personal history of diabetic foot ulcer   . Insomnia   . Sinus congestion   . Varicose vein     legs  . Osteomyelitis of foot, right, acute 08/30/2012    Past Surgical History  Procedure Laterality Date  . Amputation  04/21/2011    Procedure: AMPUTATION RAY;  Surgeon: Nadara Mustard, MD;  Location: [facility] OR;  Service: Orthopedics;  Laterality: Right;  Rt foot 2nd ray ampt  . I&d extremity  05/03/2011    Procedure: IRRIGATION AND DEBRIDEMENT EXTREMITY;  Surgeon: Nadara Mustard, MD;  Location: [facility] OR;  Service: Orthopedics;  Laterality: Right;  Irrigation and Debridement Right Foot and Place Acell Xenograft  . Toe amputation  04/24/2012    great toe   right foot  . Amputation  04/24/2012    Procedure: AMPUTATION RAY;  Surgeon: Nadara Mustard, MD;  Location: [facility] OR;  Service: Orthopedics;  Laterality: Right;  Right foot 1st ray amputation    Family History  Problem Relation Age of Onset  . Diabetes Mother   . Stroke Brother     Social History:  reports that he has been smoking Cigarettes.  He has a 15 pack-year smoking history. He has never used smokeless tobacco. He reports that he does not drink alcohol or use illicit drugs.  Allergies: No Known Allergies  Medications:  Prior to Admission:  Prescriptions prior to admission  Medication Sig Dispense Refill  . diphenhydrAMINE (BENADRYL) 25 mg capsule Take 25 mg by mouth at bedtime as needed for sleep.      Marland Kitchen doxycycline (VIBRAMYCIN) 100 MG capsule Take 100 mg by mouth 2 (two) times daily.      . fluticasone (FLONASE) 50 MCG/ACT nasal spray Place 2 sprays into the nose daily.      Marland Kitchen gabapentin (NEURONTIN) 300 MG capsule Take 300 mg by mouth 3 (three) times daily.      Marland Kitchen  glipiZIDE (GLUCOTROL) 5 MG tablet Take 1 tablet (5 mg total) by mouth 2 (two) times daily before a meal.  60 tablet  2  . lisinopril (PRINIVIL,ZESTRIL) 20 MG tablet Take 20 mg by mouth daily. Needs appointment for further refills      . fluticasone (FLONASE) 50 MCG/ACT nasal spray Place 2 sprays into the nose daily.  16 g  2    Results for orders placed during the hospital encounter of 08/30/12 (from the past 48 hour(s))  GLUCOSE, CAPILLARY     Status: Abnormal   Collection Time    08/30/12 11:39 AM      Result Value Range   Glucose-Capillary 373  (*) 70 - 99 mg/dL  GLUCOSE, CAPILLARY     Status: Abnormal   Collection Time    08/30/12 12:08 PM      Result Value Range   Glucose-Capillary 348 (*) 70 - 99 mg/dL   Comment 1 Notify RN     Comment 2 Documented in Chart    CBC     Status: None   Collection Time    08/30/12 12:20 PM      Result Value Range   WBC 6.2  4.0 - 10.5 K/uL   RBC 5.47  4.22 - 5.81 MIL/uL   Hemoglobin 15.6  13.0 - 17.0 g/dL   HCT 16.1  09.6 - 04.5 %   MCV 80.3  78.0 - 100.0 fL   MCH 28.5  26.0 - 34.0 pg   MCHC 35.5  30.0 - 36.0 g/dL   RDW 40.9  81.1 - 91.4 %   Platelets 170  150 - 400 K/uL  COMPREHENSIVE METABOLIC PANEL     Status: Abnormal   Collection Time    08/30/12 12:20 PM      Result Value Range   Sodium 130 (*) 135 - 145 mEq/L   Potassium 3.5  3.5 - 5.1 mEq/L   Chloride 95 (*) 96 - 112 mEq/L   CO2 22  19 - 32 mEq/L   Glucose, Bld 350 (*) 70 - 99 mg/dL   BUN 12  6 - 23 mg/dL   Creatinine, Ser 7.82  0.50 - 1.35 mg/dL   Calcium 9.6  8.4 - 95.6 mg/dL   Total Protein 7.2  6.0 - 8.3 g/dL   Albumin 3.4 (*) 3.5 - 5.2 g/dL   AST 13  0 - 37 U/L   ALT 10  0 - 53 U/L   Alkaline Phosphatase 85  39 - 117 U/L   Total Bilirubin 0.3  0.3 - 1.2 mg/dL   GFR calc non Af Amer >90  >90 mL/min   GFR calc Af Amer >90  >90 mL/min   Comment:            The eGFR has been calculated     using the CKD EPI equation.     This calculation has not been     validated in all clinical     situations.     eGFR's persistently     <90 mL/min signify     possible Chronic Kidney Disease.  CBC     Status: None   Collection Time    08/30/12  7:51 PM      Result Value Range   WBC 5.5  4.0 - 10.5 K/uL   RBC 5.25  4.22 - 5.81 MIL/uL   Hemoglobin 15.0  13.0 - 17.0 g/dL   HCT 21.3  08.6 - 57.8 %   MCV 83.0  78.0 - 100.0 fL   MCH 28.6  26.0 - 34.0 pg   MCHC 34.4  30.0 - 36.0 g/dL   RDW 16.1  09.6 - 04.5 %   Platelets 163  150 - 400 K/uL  CREATININE, SERUM     Status: None   Collection Time    08/30/12  7:51 PM       Result Value Range   Creatinine, Ser 0.97  0.50 - 1.35 mg/dL   GFR calc non Af Amer >90  >90 mL/min   GFR calc Af Amer >90  >90 mL/min   Comment:            The eGFR has been calculated     using the CKD EPI equation.     This calculation has not been     validated in all clinical     situations.     eGFR's persistently     <90 mL/min signify     possible Chronic Kidney Disease.    Dg Foot Complete Right  08/30/2012  *RADIOLOGY REPORT*  Clinical Data: Right foot pain. Status post amputation.  RIGHT FOOT COMPLETE - 3+ VIEW  Comparison: 04/17/2011.  Findings: Since the prior x-rays the first ray has been resected and part of the second ray has been amputated.  There is diffuse soft tissue swelling/edema involving the forefoot. Small pockets of gas are noted in the subcutaneous tissues overlying the fifth metatarsal base and head likely due to skin ulcerations.  There is underlying osteomyelitis in both of these areas.  IMPRESSION:  Open skin ulcers on the lateral aspect of the foot with underlying osteomyelitis involving the fifth metatarsal head and fifth metatarsal base   Original Report Authenticated By: Rudie Meyer, M.D.     @ROS @ Blood pressure 134/78, pulse 71, temperature 98 F (36.7 C), temperature source Oral, resp. rate 20, height 6' 0.84" (1.85 m), weight 99.8 kg (220 lb 0.3 oz), SpO2 99.00%.  Orthopaedic Exam: This patient is awake alert oriented x4. He is lying in his hospital bed right foot elevated on a pillow. Clean dry dressing is in place without inch. He has right lateral 3 toes and foot dressing was removed the foot is warm the toes have normal capillary refill her swelling about right forefoot extending from the mid tarsal joints to the metatarsal phalangeal joints. This is diffuse involving the remaining lateral 3 digits. There is a open wound over the medial aspect of the right foot that is granulating 100% measuring about a centimeter and a half over its widest central  and distal area and then tapering proximally over a length of about 3-3-1/2 cm. There are 2 smaller ulcers of the lateral aspect of the right foot this wound over the distal lateral aspect of the metatarsal and the metatarsal phalangeal joint with the granulation present at its base. A smaller ulcer is over the proximal aspect of the lateral fifth metatarsal at the metatarsal base. Both of these do not show any significant drainage today but it is warm and to palpation. X-rays as above. Evaluation of patient's chart shows no cultures has regards to this patient's foot.  Discussion: And discussed with Mr. Sleight use situation regarding a running 3 to foot and concerns regarding infection involving bone and the remaining 3 rays. Unfortunately considering his size which is close to 200 and the size of the foot most likely will not bear the weight of his body without concern of of the further breakdown with Charcot changes  and further ulceration. I discussed with Mr. Borton the current situation he is reluctant to consider surgery in the form of amputation above the foot area and wishes to keep his foot if he can. I have explained to him that the risk involved treating this without surgical debridement is the possibility of smoldering infection which can lead to further systemic infection and potentially even death. His entire lab work is not complete arterial studies can be of benefit in determining the healing potential of this foot and leg and also whether or not a amputation and another level will be successful. In addition MRI scan would allow for evaluation for possible further abscess which has occurred in this foot in the past. And potentially would require acceleration of the surgical treatment and further discussion regarding the possible need for a higher level surgical amputation. For now is to [place] does not really wish to consider surgery in the form of amputation will need to wait for the results of  further studies to see if there is localized abscess would require surgical drainage and debridement.  Assessment/Plan: Right foot cellulitis with diabetic ulcer x2 right lateral proximal and distal fifth metatarsal with radiographic signs of osteomyelitis. Patient has had worsening of infection of the right foot with clinical signs of cellulitis swelling and warmth and radiographic signs of osteomyelitis. Diabetes mellitus diet controlled poorly controlled today likely secondary to infection. Peripheral vascular disease most likely small vessel level relative to diabetes as right foot is warm. Rule out gout or other possible cause for acute warmth of the right foot in the face of granulating and healing ulcers over both the medial foot and lateral foot.  Plan: Obtain serum uric acid level, sed rate and CRP.Marland Kitchen Obtain MRI scan right foot in order to determine if localized abscess is present which would require surgical debridement and drainage. We'll follow and consider surgery if abscess is definable and requires drainage. Deoni Cosey E 08/30/2012, 9:21 PM

## 2012-08-30 NOTE — Progress Notes (Signed)
ANTIBIOTIC CONSULT NOTE - INITIAL  Pharmacy Consult for Vancomycin/Zosyn Indication: right foot osteomyelitis  No Known Allergies  Patient Measurements: Height: 6' 0.83" (185 cm) Weight: 220 lb 0.3 oz (99.8 kg) IBW/kg (Calculated) : 79.52  Vital Signs: Temp: 98 F (36.7 C) (03/21 1545) Temp src: Oral (03/21 1545) BP: 134/78 mmHg (03/21 1815) Pulse Rate: 71 (03/21 1815) Intake/Output from previous day:   Intake/Output from this shift:    Labs:  Recent Labs  08/30/12 1220  WBC 6.2  HGB 15.6  PLT 170  CREATININE 0.80   Estimated Creatinine Clearance: 132.3 ml/min (by C-G formula based on Cr of 0.8). No results found for this basename: VANCOTROUGH, VANCOPEAK, VANCORANDOM, GENTTROUGH, GENTPEAK, GENTRANDOM, TOBRATROUGH, TOBRAPEAK, TOBRARND, AMIKACINPEAK, AMIKACINTROU, AMIKACIN,  in the last 72 hours   Microbiology: No results found for this or any previous visit (from the past 720 hour(s)).  Medical History: Past Medical History  Diagnosis Date  . DM (diabetes mellitus)   . HTN (hypertension)   . History of DVT (deep vein thrombosis)   . Personal history of diabetic foot ulcer   . Peripheral vascular disease   . Peripheral neuropathy 11/19/2011  . Personal history of diabetic foot ulcer   . Insomnia   . Sinus congestion   . Varicose vein     legs  . Osteomyelitis of foot, right, acute 08/30/2012    Medications:   (Not in a hospital admission) Assessment: 52 y/o male patient admitted with right foot and wound with h/o partial amputation due to osteomyelitis requiring broad spectrum IV antibiotics. Patient received zosyn 3.375g in ED at 1700 and Vancomycin 1g at 1556, will continue therapy.  Goal of Therapy:  Vancomycin trough level 15-20 mcg/ml  Plan:  Vancomycin 1500mg  IV q12h, zosyn 3.375g IV q8h, monitor renal function, f/u c&s, and obtain vanc levels as indicated.  Verlene Mayer, PharmD, BCPS Pager [phone] 08/30/2012,7:08 PM

## 2012-08-31 ENCOUNTER — Inpatient Hospital Stay (HOSPITAL_COMMUNITY): Payer: Medicaid Other

## 2012-08-31 LAB — HEMOGLOBIN A1C
Hgb A1c MFr Bld: 10.5 % — ABNORMAL HIGH (ref <5.7)
Mean Plasma Glucose: 255 mg/dL — ABNORMAL HIGH (ref <117)

## 2012-08-31 LAB — GLUCOSE, CAPILLARY
Glucose-Capillary: 235 mg/dL — ABNORMAL HIGH (ref 70–99)
Glucose-Capillary: 272 mg/dL — ABNORMAL HIGH (ref 70–99)
Glucose-Capillary: 358 mg/dL — ABNORMAL HIGH (ref 70–99)
Glucose-Capillary: 242 mg/dL — ABNORMAL HIGH (ref 70–99)
Glucose-Capillary: 241 mg/dL — ABNORMAL HIGH (ref 70–99)

## 2012-08-31 LAB — C-REACTIVE PROTEIN: CRP: 0.8 mg/dL — ABNORMAL HIGH (ref <0.60)

## 2012-08-31 LAB — SEDIMENTATION RATE: Sed Rate: 10 mm/hr (ref 0–16)

## 2012-08-31 MED ORDER — GADOBENATE DIMEGLUMINE 529 MG/ML IV SOLN
20.0000 mL | Freq: Once | INTRAVENOUS | Status: AC | PRN
  Administered 2012-08-31: 20 mL via INTRAVENOUS

## 2012-08-31 NOTE — H&P (Signed)
FMTS Attending Admission Note: Steven Neal MD [phone] pager office [phone] I  have seen and examined this patient, reviewed their chart. I have discussed this patient with the resident. I agree with the resident's findings, assessment and care plan. 

## 2012-08-31 NOTE — Progress Notes (Signed)
Patient ID: Steven [name], male   DOB: 1960/08/20, 52 y.o.   MRN: [id number] MRI without definite abscess. Will start soaks with Dial soap. Any attempt at salvage will be dependent on his response To IV antibiotics.

## 2012-08-31 NOTE — Progress Notes (Signed)
Subjective: Complaining of nasal congestion this morning, typically uses flonase.  Denies foot pain.  Appetite is good.   Objective: Vital signs in last 24 hours: Temp:  [97.1 F (36.2 C)-98.2 F (36.8 C)] 97.8 F (36.6 C) (03/22 0629) Pulse Rate:  [71-97] 72 (03/22 0629) Resp:  [18-20] 18 (03/22 0629) BP: (92-153)/(72-95) 142/77 mmHg (03/22 0629) SpO2:  [96 %-100 %] 99 % (03/22 0629) Weight:  [220 lb 0.3 oz (99.8 kg)] 220 lb 0.3 oz (99.8 kg) (03/21 1645) Weight change:  Last BM Date: 08/29/12  Intake/Output from previous day: 03/21 0701 - 03/22 0700 In: 1400 [P.O.:600; I.V.:800] Out: 700 [Urine:700] Intake/Output this shift:    General appearance: alert and no distress Resp: clear to auscultation bilaterally Cardio: regular rate and rhythm, S1, S2 normal, no murmur, click, rub or gallop GI: soft, non-tender; bowel sounds normal; no masses,  no organomegaly Extremities: 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  There are two ulcerations on the lateral aspect of the R foot with foul smelling drainage.  Pulses: 1+bilaterally  Lab Results:  Recent Labs  08/30/12 1220 08/30/12 1951  WBC 6.2 5.5  HGB 15.6 15.0  HCT 43.9 43.6  PLT 170 163   BMET  Recent Labs  08/30/12 1220 08/30/12 1951  NA 130*  --   K 3.5  --   CL 95*  --   CO2 22  --   GLUCOSE 350*  --   BUN 12  --   CREATININE 0.80 0.97  CALCIUM 9.6  --     Studies/Results: Dg Foot Complete Right  08/30/2012  *RADIOLOGY REPORT*  Clinical Data: Right foot pain. Status post amputation.  RIGHT FOOT COMPLETE - 3+ VIEW  Comparison: 04/17/2011.  Findings: Since the prior x-rays the first ray has been resected and part of the second ray has been amputated.  There is diffuse soft tissue swelling/edema involving the forefoot. Small pockets of gas are noted in the subcutaneous tissues overlying the fifth metatarsal base and head likely due to skin ulcerations.  There is underlying osteomyelitis in both of these areas.   IMPRESSION:  Open skin ulcers on the lateral aspect of the foot with underlying osteomyelitis involving the fifth metatarsal head and fifth metatarsal base   Original Report Authenticated By: Rudie Meyer, M.D.     Medications:  I have reviewed the patient's current medications. Scheduled: . gabapentin  300 mg Oral TID  . heparin  5,000 Units Subcutaneous Q8H  . insulin aspart  0-5 Units Subcutaneous QHS  . insulin aspart  0-9 Units Subcutaneous TID WC  . lisinopril  20 mg Oral Daily  . piperacillin-tazobactam (ZOSYN)  IV  3.375 g Intravenous Q8H  . vancomycin  1,500 mg Intravenous Q12H   Continuous: . sodium chloride     WJX:BJYNWGNFAOZHY, acetaminophen, HYDROcodone-acetaminophen, ondansetron (ZOFRAN) IV, ondansetron, traZODone  Assessment/Plan: Steven [name] is a 52 y.o. 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 Xray revealed osteomyelitis of the fifth metatarsal head and fifth metatarsal base.   # Osteomyeliltis - No leukocytosis or systemic signs of infection.  - Continue empiric Vanc and Zosyn - Obtaining MRI for further evaluation for deep abscess that may require surgical drainage.  - Orthopedics consulted. Patient will likely need further intervention with debridement with potential further amputation, will await MRI results. He has  1+ distal pulses but can consider ABI to see to what extent an amputation would be needed.  - Will continue to monitor closely  # DM-2, uncontrolled with peripheral neuropathy  - Will continue home Gabapentin.  - SSI, sensitive with QHS coverage. May need to increase in the setting of infection.  - CBG's TID AC and QHS  - A1c elevated significantly from prior check.  Likely due to compliance issues. - He has very poor understanding of his disease and further consequences of not  caring for himself. Will have diabetes educator see.   # HTN  - Continuing home lisinopril 20 mg, -BP looks ok on current medications  # Alcohol abuse  - CIWA protocol  -I have talked with him about cutting back on multiple occasions but he remains ambiguous in actually doing this.   FEN/GI: NS @ 100 mL/hr; Carb modified diet  Prophylaxis: Heparin SQ  Disposition: Pending clinical improvement  Code Status: Full code   LOS: 1 day   Steven [name] 08/31/2012, 7:10 AM

## 2012-08-31 NOTE — Progress Notes (Signed)
Patient ID: Steven [name], male   DOB: Feb 13, 1961, 52 y.o.   MRN: [id number] Subjective:     Pain in his improved.  Patient is awake alert and oriented x4.  Patient reports pain as mild.    Objective:   VITALS:  Temp:  [97.1 F (36.2 C)-98.6 F (37 C)] 98.6 F (37 C) (03/22 0952) Pulse Rate:  [71-97] 73 (03/22 0952) Resp:  [18-20] 18 (03/22 0952) BP: (92-153)/(62-95) 115/62 mmHg (03/22 0952) SpO2:  [96 %-100 %] 100 % (03/22 0952) Weight:  [99.8 kg (220 lb 0.3 oz)] 99.8 kg (220 lb 0.3 oz) (03/21 1645)  ABD soft Intact pulses distally Dorsiflexion/Plantar flexion intact Incision: dressing C/D/I and scant drainage   LABS  Recent Labs  08/30/12 1220 08/30/12 1951  HGB 15.6 15.0  WBC 6.2 5.5  PLT 170 163    Recent Labs  08/30/12 1220 08/30/12 1951  NA 130*  --   K 3.5  --   CL 95*  --   CO2 22  --   BUN 12  --   CREATININE 0.80 0.97  GLUCOSE 350*  --    No results found for this basename: LABPT, INR,  in the last 72 hours   Assessment/Plan:    Right foot cellulitis. Awaiting MRI to eval if need for surgical intervention. Unfortunately if further partial foot amputation is performed then it is unlikely the foot will  be able to tolerate patient body weight and trans tibial amputation may then be necessary. This patient is not receptive to considering BKA at this point.  Continue ABX therapy due to Post-op infection Pending MRI right foot  Jhoanna Heyde E 08/31/2012, 11:17 AM

## 2012-08-31 NOTE — Progress Notes (Signed)
FMTS Attending Daily Note: Sara Neal MD [phone] pager office [phone] I  have seen and examined this patient, reviewed their chart. I have discussed this patient with the resident. I agree with the resident's findings, assessment and care plan. 

## 2012-09-01 DIAGNOSIS — R197 Diarrhea, unspecified: Secondary | ICD-10-CM

## 2012-09-01 DIAGNOSIS — L97909 Non-pressure chronic ulcer of unspecified part of unspecified lower leg with unspecified severity: Secondary | ICD-10-CM

## 2012-09-01 LAB — BASIC METABOLIC PANEL
Calcium: 9 mg/dL (ref 8.4–10.5)
GFR calc Af Amer: >90 mL/min (ref >90)
GFR calc non Af Amer: >90 mL/min (ref >90)
Glucose, Bld: 230 mg/dL — ABNORMAL HIGH (ref 70–99)
Sodium: 134 mEq/L — ABNORMAL LOW (ref 135–145)

## 2012-09-01 LAB — GLUCOSE, CAPILLARY: Glucose-Capillary: 202 mg/dL — ABNORMAL HIGH (ref 70–99)

## 2012-09-01 MED ORDER — INSULIN ASPART 100 UNIT/ML ~~LOC~~ SOLN
0.0000 [IU] | Freq: Three times a day (TID) | SUBCUTANEOUS | Status: DC
  Administered 2012-09-01: 3 [IU] via SUBCUTANEOUS
  Administered 2012-09-01: 15 [IU] via SUBCUTANEOUS
  Administered 2012-09-02 (×2): 3 [IU] via SUBCUTANEOUS
  Administered 2012-09-02: 8 [IU] via SUBCUTANEOUS
  Administered 2012-09-03: 5 [IU] via SUBCUTANEOUS
  Administered 2012-09-03: 3 [IU] via SUBCUTANEOUS

## 2012-09-01 MED ORDER — BISACODYL 5 MG PO TBEC
5.0000 mg | DELAYED_RELEASE_TABLET | Freq: Every day | ORAL | Status: DC | PRN
  Filled 2012-09-01: qty 1

## 2012-09-01 MED ORDER — POLYETHYLENE GLYCOL 3350 17 G PO PACK
17.0000 g | PACK | Freq: Every day | ORAL | Status: DC
  Administered 2012-09-01: 17 g via ORAL
  Filled 2012-09-01 (×4): qty 1

## 2012-09-01 NOTE — Progress Notes (Signed)
Patient ID: Steven [name], male   DOB: 02-17-1961, 52 y.o.   MRN: [id number] Subjective:    Less swelling, less pain.  Patient reports pain as mild.    Objective:   VITALS:  Temp:  [98.1 F (36.7 C)-98.2 F (36.8 C)] 98.2 F (36.8 C) (03/23 0546) Pulse Rate:  [65-100] 65 (03/23 1044) Resp:  [16] 16 (03/23 0546) BP: (129-146)/(81-87) 143/85 mmHg (03/23 1044) SpO2:  [100 %] 100 % (03/23 0546)  ABD soft Intact pulses distally Incision: scant drainage Compartment soft Swelling right foot is markedly better no fluctuance. Has osteo changes on MRI involving right 5th MT base and 3rd MT head.    LABS  Recent Labs  08/30/12 1220 08/30/12 1951  HGB 15.6 15.0  WBC 6.2 5.5  PLT 170 163    Recent Labs  08/30/12 1220 08/30/12 1951 09/01/12 0500  NA 130*  --  134*  K 3.5  --  3.8  CL 95*  --  101  CO2 22  --  23  BUN 12  --  12  CREATININE 0.80 0.97 0.83  GLUCOSE 350*  --  230*   No results found for this basename: LABPT, INR,  in the last 72 hours   Assessment/Plan:    Osteomyelitis right 3rd MT head and right 5th MTbase. Cellulitis improving.  Advance diet Up with therapy Continue ABX therapy due to infection Discharge home with home health on antibiotic treatment. Will likely follow up with Dr. Lajoyce Corners in 1 week post discharge  Sohail Capraro E 09/01/2012, 1:48 PM

## 2012-09-01 NOTE — Progress Notes (Signed)
FMTS Attending Daily Note: Swain Acree MD [phone] pager office [phone] I  have seen and examined this patient, reviewed their chart. I have discussed this patient with the resident. I agree with the resident's findings, assessment and care plan. 

## 2012-09-01 NOTE — Progress Notes (Signed)
Daily Progress Note Family Medicine Teaching Service Leilah Polimeni M. Lariya Kinzie, MD Service Pager: [phone]  Subjective: Patient doing well this morning. States he is eating ok. He is questioning exactly "what is going on". Also reports he has not had a bowel movement since last week.  Objective: Vital signs in last 24 hours: Temp:  [97.6 F (36.4 C)-98.6 F (37 C)] 98.2 F (36.8 C) (03/23 0546) Pulse Rate:  [63-100] 74 (03/23 0546) Resp:  [16-18] 16 (03/23 0546) BP: (115-146)/(62-87) 129/81 mmHg (03/23 0546) SpO2:  [100 %] 100 % (03/23 0546) Weight change:  Last BM Date: 08/29/12  Intake/Output from previous day: 03/22 0701 - 03/23 0700 In: 1700 [P.O.:1080; I.V.:620] Out: 850 [Urine:850] Intake/Output this shift:    General appearance: sitting up in bed, alert and no distress Resp: clear to auscultation bilaterally Cardio: regular rate and rhythm, S1, S2 normal, no murmur, click, rub or gallop GI: soft, non-tender; bowel sounds normal Extremities: 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 Bandage in place on right foot, C/D/I. There are two ulcerations on the lateral aspect of the R foot with some drainage. Red ulcer base. Pulses: 1+bilaterally  Lab Results:  Recent Labs  08/30/12 1220 08/30/12 1951  WBC 6.2 5.5  HGB 15.6 15.0  HCT 43.9 43.6  PLT 170 163   BMET  Recent Labs  08/30/12 1220 08/30/12 1951 09/01/12 0500  NA 130*  --  134*  K 3.5  --  3.8  CL 95*  --  101  CO2 22  --  23  GLUCOSE 350*  --  230*  BUN 12  --  12  CREATININE 0.80 0.97 0.83  CALCIUM 9.6  --  9.0    Studies/Results: Mr Foot Right W Wo Contrast  08/31/2012  *RADIOLOGY REPORT*  Clinical Data: Osteomyelitis.  Ulcerations along the lateral foot. Diabetes and peripheral neuropathy.  Query deep abscess requiring surgical drainage.  MRI OF THE RIGHT FOREFOOT WITHOUT AND WITH CONTRAST  Technique:  Multiplanar, multisequence MR imaging was performed both before and after administration of  intravenous contrast.  Contrast: 20mL MULTIHANCE GADOBENATE DIMEGLUMINE 529 MG/ML IV SOLN  Comparison: Multiple exams, including 08/30/2012 and 04/19/2011  Findings: Prior resection of the first digit at the Lisfranc joint, and of the second digit at the distal metatarsal diaphysis.  Today's exam confirms abnormal edema and enhancement in the base of the fifth metatarsal with an overlying drainage tract to the scan, compatible with draining osteomyelitis.  The head of the metatarsal does not demonstrate significant edema or enhancement, although there is some adjacent cellulitis tracking along the lateral foot. Abnormal edema and enhancement in the head of the third metatarsal suggests early osteomyelitis.  There is diffuse edema and low-level enhancement in the subcutaneous tissues of the dorsum of the foot along with abnormal edema and enhancement in the soft tissues along the amputation site.  Low-level edema is noted tracking along the plantar musculature of the foot, which is otherwise atrophic.  No drainable deep tissue abscess.  IMPRESSION:  1.  Osteomyelitis of the fifth metatarsal base with a sinus tract draining to the scan. 2.  Low-level edema and enhancement in the third metatarsal head suggesting early osteomyelitis.  The fifth metatarsal head has a relatively normal marrow appearance. 3.  Diffuse subcutaneous edema and enhancement most confluent along the medial foot and lateral foot, favoring cellulitis.  Myositis is suspected along the plantar musculature of the foot.  No deep soft tissue abscess observed in the forefoot.  Original Report Authenticated By: Gaylyn Rong, M.D.    Dg Foot Complete Right  08/30/2012  *RADIOLOGY REPORT*  Clinical Data: Right foot pain. Status post amputation.  RIGHT FOOT COMPLETE - 3+ VIEW  Comparison: 04/17/2011.  Findings: Since the prior x-rays the first ray has been resected and part of the second ray has been amputated.  There is diffuse soft tissue  swelling/edema involving the forefoot. Small pockets of gas are noted in the subcutaneous tissues overlying the fifth metatarsal base and head likely due to skin ulcerations.  There is underlying osteomyelitis in both of these areas.  IMPRESSION:  Open skin ulcers on the lateral aspect of the foot with underlying osteomyelitis involving the fifth metatarsal head and fifth metatarsal base   Original Report Authenticated By: Rudie Meyer, M.D.     Medications:  I have reviewed the patient's current medications. Scheduled: . gabapentin  300 mg Oral TID  . heparin  5,000 Units Subcutaneous Q8H  . insulin aspart  0-5 Units Subcutaneous QHS  . insulin aspart  0-9 Units Subcutaneous TID WC  . lisinopril  20 mg Oral Daily  . piperacillin-tazobactam (ZOSYN)  IV  3.375 g Intravenous Q8H  . vancomycin  1,500 mg Intravenous Q12H   Continuous: . sodium chloride 100 mL/hr at 08/31/12 1348   ZOX:[id number], acetaminophen, HYDROcodone-acetaminophen, ondansetron (ZOFRAN) IV, ondansetron, traZODone  Assessment/Plan: BARTLOMIEJ JENKINSON is a 52 y.o. 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 Xray revealed osteomyelitis of the fifth metatarsal head and fifth metatarsal base.   # Osteomyeliltis - No leukocytosis or systemic signs of infection.  - Continue empiric Vanc and Zosyn. Needs new PIV due to infiltration. Awaiting IV team. - MRI does not show abscess, therefore doing foot soaks per Ortho. - Orthopedics consulted. Appreciate their input and excellent care of this mutual patient - He has 1+ distal pulses but can consider ABI to see to what extent an amputation would be needed.  - Will continue to monitor closely  # DM-2, uncontrolled with peripheral neuropathy  - Will continue home Gabapentin.  - SSI, increased to moderate. - CBG's TID  AC and QHS  - A1c elevated significantly from prior check.  Likely due to compliance issues. - He has very poor understanding of his disease and further consequences of not caring for himself. Will have diabetes educator see.   # HTN  - Continuing home lisinopril 20 mg, - BP stable 140's/80's  # Alcohol abuse  - CIWA protocol   # Constipation - Add Miralax and stool softener daily   FEN/GI: NS @ 100 mL/hr; Carb modified diet  Prophylaxis: Heparin SQ  Disposition: Pending clinical improvement  Code Status: Full code   LOS: 2 days   Reizel Calzada 09/01/2012, 9:33 AM

## 2012-09-01 NOTE — Progress Notes (Signed)
VASCULAR LAB PRELIMINARY  PRELIMINARY  PRELIMINARY  PRELIMINARY   VASCULAR LAB PRELIMINARY  ARTERIAL  ABI completed:    RIGHT    LEFT    PRESSURE WAVEFORM  PRESSURE WAVEFORM  BRACHIAL 156 T BRACHIAL    DP   DP    AT 199 M AT 120 M  PT 164 M PT 136 M  PER   PER    GREAT TOE  NA GREAT TOE  NA    RIGHT LEFT  ABI 1.0 0.87     Rasul Decola, RVT 09/01/2012, 12:21 PM      Belladonna Lubinski, RVT 09/01/2012, 12:21 PM

## 2012-09-02 DIAGNOSIS — M869 Osteomyelitis, unspecified: Secondary | ICD-10-CM

## 2012-09-02 LAB — GLUCOSE, CAPILLARY
Glucose-Capillary: 183 mg/dL — ABNORMAL HIGH (ref 70–99)
Glucose-Capillary: 195 mg/dL — ABNORMAL HIGH (ref 70–99)
Glucose-Capillary: 274 mg/dL — ABNORMAL HIGH (ref 70–99)

## 2012-09-02 MED ORDER — VANCOMYCIN HCL 10 G IV SOLR: 1750.0000 mg | Freq: Two times a day (BID) | INTRAVENOUS | Status: DC

## 2012-09-02 MED ORDER — VANCOMYCIN HCL 10 G IV SOLR
1750.0000 mg | Freq: Two times a day (BID) | INTRAVENOUS | Status: DC
  Administered 2012-09-02 – 2012-09-03 (×2): 1750 mg via INTRAVENOUS
  Filled 2012-09-02 (×4): qty 1750

## 2012-09-02 MED ORDER — SODIUM CHLORIDE 0.9 % IJ SOLN: 10.0000 mL | INTRAMUSCULAR | Status: DC | PRN

## 2012-09-02 MED ORDER — LEVOFLOXACIN 750 MG PO TABS: 750.0000 mg | ORAL_TABLET | Freq: Every day | ORAL | Status: DC

## 2012-09-02 NOTE — Progress Notes (Addendum)
Inpatient Diabetes Program Recommendations  AACE/ADA: New Consensus Statement on Inpatient Glycemic Control (2013)  Target Ranges:  Prepandial:   less than 140 mg/dL      Peak postprandial:   less than 180 mg/dL (1-2 hours)      Critically ill patients:  140 - 180 mg/dL  Results for THEDFORD, BUNTON (MRN [id number]) as of 09/02/2012 11:06  Ref. Range 09/01/2012 07:15 09/01/2012 11:23 09/01/2012 16:20 09/01/2012 21:35 09/02/2012 07:24  Glucose-Capillary Latest Range: 70-99 mg/dL 409 (H) 811 (H) 914 (H) 290 (H) 183 (H)   Inpatient Diabetes Program Recommendations Insulin - Basal: Start Lantus or Levemir 15 units  A1C=10.5 ADDENDUM: Diabetes Coordinator spoke with patient concerning A1C=10.5.  Patient is interested in going to the [facility] ([facility] for OP education.  Patient states that he has never been to OP edu classes.  Patient said he has low literacy so that was mentioned in the OP referral.   Thank you  Piedad Climes BSN, RN,CDE Inpatient Diabetes Coordinator [phone] (team pager)

## 2012-09-02 NOTE — Progress Notes (Signed)
ANTIBIOTIC CONSULT NOTE - FOLLOW UP  Pharmacy Consult for Vancomycin and Zosyn Indication: Osteomyelitis  No Known Allergies  Patient Measurements: Height: 6' 0.83" (185 cm) Weight: 220 lb 0.3 oz (99.8 kg) IBW/kg (Calculated) : 79.52 Adjusted Body Weight:   Vital Signs: Temp: 98.2 F (36.8 C) (03/24 0623) BP: 149/85 mmHg (03/24 0623) Pulse Rate: 64 (03/24 0623) Intake/Output from previous day: 03/23 0701 - 03/24 0700 In: 2750 [P.O.:2400; IV Piggyback:350] Out: 1980 [Urine:1980] Intake/Output from this shift:    Labs:  Recent Labs  08/30/12 1951 09/01/12 0500  WBC 5.5  --   HGB 15.0  --   PLT 163  --   CREATININE 0.97 0.83   Estimated Creatinine Clearance: 127.5 ml/min (by C-G formula based on Cr of 0.83).  Recent Labs  09/02/12 1033  VANCOTROUGH 14.2     Microbiology: No results found for this or any previous visit (from the past 720 hour(s)).  Anti-infectives   Start     Dose/Rate Route Frequency Ordered Stop   08/30/12 2300  vancomycin (VANCOCIN) 1,500 mg in sodium chloride 0.9 % 500 mL IVPB     1,500 mg 250 mL/hr over 120 Minutes Intravenous Every 12 hours 08/30/12 1913     08/30/12 2200  piperacillin-tazobactam (ZOSYN) IVPB 3.375 g     3.375 g 12.5 mL/hr over 240 Minutes Intravenous 3 times per day 08/30/12 1913     08/30/12 1545  piperacillin-tazobactam (ZOSYN) IVPB 3.375 g     3.375 g 100 mL/hr over 30 Minutes Intravenous  Once 08/30/12 1542 08/30/12 1842   08/30/12 1545  vancomycin (VANCOCIN) IVPB 1000 mg/200 mL premix     1,000 mg 200 mL/hr over 60 Minutes Intravenous  Once 08/30/12 1542 08/30/12 1721      Assessment: 51yom on Vancomycin and Zosyn Day 3 for osteomyelitis of right foot. Patient is currently afebrile, WBC wnl and no cultures reported. Renal function has remained stable during admission. Vancomycin trough (14.2) drawn this AM was just below goal level and true trough may be slightly lower as last dose was given ~1 hour late - will  increase dose but keep q12h regimen to facilitate discharge/home antibiotics.  Goal of Therapy:  Vancomycin trough level 15-20 mcg/ml  Plan:  1. Increase Vancomycin to 1750mg  IV q12h 2. Continue Zosyn 3.375g IV q8h - infuse over 4hrs 3. Follow-up renal function, clinical status and discharge plans  Cleon Dew [phone] 09/02/2012,1:32 PM

## 2012-09-02 NOTE — Progress Notes (Signed)
Utilization review completed. Rickardo Brinegar, RN, BSN. 

## 2012-09-02 NOTE — Progress Notes (Signed)
Peripherally Inserted Central Catheter/Midline Placement  The IV Nurse has discussed with the patient and/or persons authorized to consent for the patient, the purpose of this procedure and the potential benefits and risks involved with this procedure.  The benefits include less needle sticks, lab draws from the catheter and patient may be discharged home with the catheter.  Risks include, but not limited to, infection, bleeding, blood clot (thrombus formation), and puncture of an artery; nerve damage and irregular heat beat.  Alternatives to this procedure were also discussed.  PICC/Midline Placement Documentation        Steven [name] 09/02/2012, 3:05 PM

## 2012-09-02 NOTE — Progress Notes (Signed)
FMTS Attending Admission Note: Steven Klahn,MD I  have seen and examined this patient, reviewed their chart. I have discussed this patient with the resident. I agree with the resident's findings, assessment and care plan.  Patient seen today,doing well,denies pain,I discussed his health condition (Osteomyelitis and uncontrolled DM) with him and need for prolong A/B treatment as well as achieving good DM control.I agree with the resident,addition of Metformin to his DM regimen will be helpful. Upon reviewing of his report,he has abnormal ABI,to consider vascular surg assessment during this admission or as out patient.

## 2012-09-02 NOTE — Progress Notes (Signed)
Daily Progress Note Family Medicine Teaching Service Service Pager: [phone]  Subjective: Doing well. Asking if he gets to go home today.  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 Bandage in place on right foot, C/D/I. NT on exam.  Lab Results:  Recent Labs  08/30/12 1220 08/30/12 1951  WBC 6.2 5.5  HGB 15.6 15.0  HCT 43.9 43.6  PLT 170 163   BMET  Recent Labs  08/30/12 1220 08/30/12 1951 09/01/12 0500  NA 130*  --  134*  K 3.5  --  3.8  CL 95*  --  101  CO2 22  --  23  GLUCOSE 350*  --  230*  BUN 12  --  12  CREATININE 0.80 0.97 0.83  CALCIUM 9.6  --  9.0    Studies/Results: Mr Foot Right W Wo Contrast  08/31/2012 IMPRESSION:  1.  Osteomyelitis of the fifth metatarsal base with a sinus tract draining to the scan. 2.  Low-level edema and enhancement in the third metatarsal head suggesting early osteomyelitis.  The fifth metatarsal head has a relatively normal marrow appearance. 3.  Diffuse subcutaneous edema and enhancement most confluent along the medial foot and lateral foot, favoring cellulitis.  Myositis is suspected along the plantar musculature of the foot.  No deep soft tissue abscess observed in the forefoot.   Original Report Authenticated By: Gaylyn Rong, M.D.     Medications:  I have reviewed the patient's current medications. Scheduled: .  gabapentin  300 mg Oral TID  . heparin  5,000 Units Subcutaneous Q8H  . insulin aspart  0-15 Units Subcutaneous TID WC  . insulin aspart  0-5 Units Subcutaneous QHS  . lisinopril  20 mg Oral Daily  . piperacillin-tazobactam (ZOSYN)  IV  3.375 g Intravenous Q8H  . polyethylene glycol  17 g Oral Daily  . vancomycin  1,500 mg Intravenous Q12H   Continuous: . sodium chloride 100 mL/hr at 09/01/12 1140   GNF:[id number], acetaminophen, bisacodyl, HYDROcodone-acetaminophen, ondansetron (ZOFRAN) IV, ondansetron, traZODone  Assessment/Plan: Steven [name] is a 52 y.o. 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 Xray revealed osteomyelitis of the fifth metatarsal head and fifth metatarsal base.   # Osteomyeliltis - No leukocytosis or systemic signs of infection.  - spoke with Dr. Otelia Sergeant and appreciate his help, advised could transition to PO cipro/doxy and watch for a day to see if he responds to this, if he doesn't, then would need to go home on IV antibiotics - will need f/u with Dr. Lajoyce Corners in 1 week - MRI does not show abscess, therefore doing foot soaks per Ortho. - Orthopedics consulted. Appreciate their input and excellent care of this mutual patient - ABI of 1 in right leg  and 0.87 in left leg  - Will continue to monitor closely  # DM-2, uncontrolled with peripheral neuropathy  - Will continue home Gabapentin.  - SSI, increased to moderate. - CBG's TID AC and QHS  - A1c elevated significantly from prior check.  Likely due to compliance issues. - He has very poor understanding of his disease and further consequences of not caring for himself. Will have diabetes educator see.  - plan to not discharge on insulin-patient on glipizide 5 mg BID at home, will consider discharging on metformin with close f/u with PCP  #  HTN-stable - Continuing home lisinopril 20 mg  # Alcohol abuse  - CIWA protocol   # Constipation - Add Miralax and stool softener daily  FEN/GI: NS @ 100 mL/hr; Carb modified diet  Prophylaxis: Heparin SQ  Disposition: likely home on IV antibiotics today  Code Status: Full code   LOS: 3 days   Marikay Alar 09/02/2012, 9:17 AM

## 2012-09-03 DIAGNOSIS — I872 Venous insufficiency (chronic) (peripheral): Secondary | ICD-10-CM

## 2012-09-03 MED ORDER — HEPARIN SOD (PORK) LOCK FLUSH 100 UNIT/ML IV SOLN
250.0000 [IU] | INTRAVENOUS | Status: AC | PRN
  Administered 2012-09-03: 500 [IU]

## 2012-09-03 MED ORDER — LEVOFLOXACIN 750 MG PO TABS: 750.0000 mg | ORAL_TABLET | Freq: Every day | ORAL | Status: DC

## 2012-09-03 MED ORDER — VANCOMYCIN HCL 10 G IV SOLR: 1750.0000 mg | Freq: Two times a day (BID) | INTRAVENOUS | Status: DC

## 2012-09-03 NOTE — Discharge Summary (Signed)
Physician Discharge Summary  Patient ID: CADELL GABRIELSON MRN: [id number] DOB: 08/15/1960 Age: 52 y.o.  Admit date: 08/30/2012 Discharge date: 09/03/2012 Admitting Physician: Nestor Ramp, MD  PCP: MATTHEWS,CODY, DO  Consultants: Ortho, Dr. Otelia Sergeant     Discharge Diagnosis: Principal Problem:   Osteomyelitis of foot Active Problems:   Diabetes mellitus type II, uncontrolled   HTN (hypertension)   ETOH abuse    Hospital Course Steven [name] is a 52 y.o. 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 Xray revealed osteomyelitis of the fifth metatarsal head and fifth metatarsal base.   # Osteomyeliltis: patient presented with ulceration on the lateral aspect of his right foot with XR that revealed osteo. MRI was obtained that revealed osteo in the 5th metatarsal base and 3rd metatarsal head. Patient was placed on vanc and zosyn IV. Ortho was consulted and following MRI resulted stated no amputation at this time. Started on dial foot soaks and continued antibiotics per ortho. Patient remained without elevated WBC or signs of systemic infection throughout hospitalization. Ortho recommended discharge home with continued antibiotics and f/u with Dr. Lajoyce Corners in 1 week. Per discussion with ID patient was discharged on levaquin and Vanc. Additionally ABI was ordered and revealed ABI of 1 in right leg and 0.87 in left leg, will need vascular in outpatient setting.   # DM-2, uncontrolled with peripheral neuropathy: patient was maintained on SSI in hospital. Continued patients gabapentin. A1c was 10.5. Patient felt to have poor understanding of his diabetes so DM educator was consulted. Patient not felt to be good candidate for insulin at discharge. Patient discharged on home regimen of glipizide.  # HTN-continued home lisinopril 20 mg   # Alcohol  abuse- CIWA protocol   # Constipation- Miralax and stool softener daily  Problem List 1. Osteomyelitis 2. DM 2, uncontrolled 3. HTN 4. Alcohol abuse 5. Constipation     Procedures/Imaging:  Mr Foot Right W Wo Contrast  08/31/2012  IMPRESSION:  1.  Osteomyelitis of the fifth metatarsal base with a sinus tract draining to the scan. 2.  Low-level edema and enhancement in the third metatarsal head suggesting early osteomyelitis.  The fifth metatarsal head has a relatively normal marrow appearance. 3.  Diffuse subcutaneous edema and enhancement most confluent along the medial foot and lateral foot, favoring cellulitis.  Myositis is suspected along the plantar musculature of the foot.  No deep soft tissue abscess observed in the forefoot.   Original Report Authenticated By: Gaylyn Rong, M.D.    Dg Foot Complete Right  08/30/2012  IMPRESSION:  Open skin ulcers on the lateral aspect of the foot with underlying osteomyelitis involving the fifth metatarsal head and fifth metatarsal base   Original Report Authenticated By: Rudie Meyer, M.D.     Labs  CBC  Recent Labs Lab 08/30/12 1220 08/30/12 1951  WBC 6.2 5.5  HGB 15.6 15.0  HCT 43.9 43.6  PLT 170 163   BMET  Recent Labs Lab 08/30/12 1220 08/30/12 1951 09/01/12 0500  NA 130*  --  134*  K 3.5  --  3.8  CL 95*  --  101  CO2 22  --  23  BUN 12  --  12  CREATININE 0.80 0.97 0.83  CALCIUM 9.6  --  9.0  PROT 7.2  --   --   BILITOT  0.3  --   --   ALKPHOS 85  --   --   ALT 10  --   --   AST 13  --   --   GLUCOSE 350*  --  230*   Results for orders placed during the hospital encounter of 08/30/12 (from the past 72 hour(s))  BASIC METABOLIC PANEL     Status: Abnormal   Collection Time    09/01/12  5:00 AM      Result Value Range   Sodium 134 (*) 135 - 145 mEq/L   Potassium 3.8  3.5 - 5.1 mEq/L   Chloride 101  96 - 112 mEq/L   CO2 23  19 - 32 mEq/L   Glucose, Bld 230 (*) 70 - 99 mg/dL   BUN 12  6 - 23 mg/dL    Creatinine, Ser 6.57  0.50 - 1.35 mg/dL   Calcium 9.0  8.4 - 84.6 mg/dL   GFR calc non Af Amer >90  >90 mL/min   GFR calc Af Amer >90  >90 mL/min   Comment:            The eGFR has been calculated     using the CKD EPI equation.     This calculation has not been     validated in all clinical     situations.     eGFR's persistently     <90 mL/min signify     possible Chronic Kidney Disease.  GLUCOSE, CAPILLARY     Status: Abnormal   Collection Time    09/01/12  7:15 AM      Result Value Range   Glucose-Capillary 202 (*) 70 - 99 mg/dL  GLUCOSE, CAPILLARY     Status: Abnormal   Collection Time    09/01/12 11:23 AM      Result Value Range   Glucose-Capillary 361 (*) 70 - 99 mg/dL   Comment 1 Notify RN    GLUCOSE, CAPILLARY     Status: Abnormal   Collection Time    09/01/12  4:20 PM      Result Value Range   Glucose-Capillary 185 (*) 70 - 99 mg/dL   Comment 1 Notify RN    GLUCOSE, CAPILLARY     Status: Abnormal   Collection Time    09/01/12  9:35 PM      Result Value Range   Glucose-Capillary 290 (*) 70 - 99 mg/dL  GLUCOSE, CAPILLARY     Status: Abnormal   Collection Time    09/02/12  7:24 AM      Result Value Range   Glucose-Capillary 183 (*) 70 - 99 mg/dL  VANCOMYCIN, TROUGH     Status: None   Collection Time    09/02/12 10:33 AM      Result Value Range   Vancomycin Tr 14.2  10.0 - 20.0 ug/mL  GLUCOSE, CAPILLARY     Status: Abnormal   Collection Time    09/02/12 11:11 AM      Result Value Range   Glucose-Capillary 195 (*) 70 - 99 mg/dL   Comment 1 Notify RN    GLUCOSE, CAPILLARY     Status: Abnormal   Collection Time    09/02/12  4:00 PM      Result Value Range   Glucose-Capillary 274 (*) 70 - 99 mg/dL   Comment 1 Notify RN    GLUCOSE, CAPILLARY     Status: Abnormal   Collection Time    09/02/12 10:28 PM  Result Value Range   Glucose-Capillary 382 (*) 70 - 99 mg/dL  GLUCOSE, CAPILLARY     Status: Abnormal   Collection Time    09/03/12  6:25 AM       Result Value Range   Glucose-Capillary 200 (*) 70 - 99 mg/dL       Patient condition at time of discharge/disposition: stable  Disposition-home   Follow up issues: 1. Ortho f/u with Dr. Donell Beers patient attends this 2. Poorly controlled DM-patient might benefit from increased dosing of current medication or addition of metformin. 3. ABI borderline in hospital given DM-will likely need vascular surgery outpatient evaluation  Discharge follow up:  Follow-up Information   Follow up with MATTHEWS,CODY, DO. (Thursday April 3rd @ 11 AM )    Contact information:   [address] [place] [address] [phone]       Follow up with Nadara Mustard, MD. Schedule an appointment as soon as possible for a visit in 1 week.   Contact information:   [address] [place] [address] [phone]      Discharge Orders   Future Appointments Provider Department Dept Phone   09/12/2012 11:00 AM Everrett Coombe, DO [facility] [phone]   Future Orders Complete By Expires     Ambulatory referral to Nutrition and Diabetic Education  As directed     Comments:      A1C=10.5 and patient does not read well.    Call MD for:  persistant nausea and vomiting  As directed     Call MD for:  redness, tenderness, or signs of infection (pain, swelling, redness, odor or green/yellow discharge around incision site)  As directed     Call MD for:  redness, tenderness, or signs of infection (pain, swelling, redness, odor or green/yellow discharge around incision site)  As directed     Call MD for:  severe uncontrolled pain  As directed     Call MD for:  severe uncontrolled pain  As directed     Diet - low sodium heart healthy  As directed     Increase activity slowly  As directed         Discharge Instructions: Please refer to Patient Instructions section of EMR for full details.  Patient was counseled important signs and symptoms that should prompt return to medical  care, changes in medications, dietary instructions, activity restrictions, and follow up appointments.  Significant instructions noted below:    Discharge Medications   Medication List    STOP taking these medications       doxycycline 100 MG capsule  Commonly known as:  VIBRAMYCIN      TAKE these medications       diphenhydrAMINE 25 mg capsule  Commonly known as:  BENADRYL  Take 25 mg by mouth at bedtime as needed for sleep.     fluticasone 50 MCG/ACT nasal spray  Commonly known as:  FLONASE  Place 2 sprays into the nose daily.     gabapentin 300 MG capsule  Commonly known as:  NEURONTIN  Take 300 mg by mouth 3 (three) times daily.     glipiZIDE 5 MG tablet  Commonly known as:  GLUCOTROL  Take 1 tablet (5 mg total) by mouth 2 (two) times daily before a meal.     levofloxacin 750 MG tablet  Commonly known as:  LEVAQUIN  Take 1 tablet (750 mg total) by mouth daily.     lisinopril 20 MG tablet  Commonly known as:  PRINIVIL,ZESTRIL  Take 20 mg by mouth daily. Needs appointment for further refills     sodium chloride 0.9 % SOLN 500 mL with vancomycin 10 G SOLR 1,750 mg  Inject 1,750 mg into the vein every 12 (twelve) hours.       Marikay Alar, MD of [facility] 09/03/2012 10:01 PM

## 2012-09-03 NOTE — Progress Notes (Signed)
Pt discharged to home accompanied by friend. Discharge instructions and rx given and explained and pt stated understanding. Pts PICC was hep locked by IV team prior to discharge. Pt left unit in a stable condition via wheelchair.

## 2012-09-03 NOTE — Progress Notes (Signed)
FMTS Attending Admission Note: Steven [name] [phone] pager office [phone] I  have seen and examined this patient, reviewed their chart. I have discussed this patient with the resident. I agree with the resident's findings, assessment and care plan.

## 2012-09-03 NOTE — Care Management Note (Signed)
CARE MANAGEMENT NOTE 09/03/2012  Patient:  Steven [name],Steven [name]   Account Number:  [id number]  Date Initiated:  09/03/2012  Documentation initiated by:  Vance Peper  Subjective/Objective Assessment:   52 yr old male adm for right foot ulcer     Action/Plan:   Patient will need IV antibiotics. Has PICC Line   Anticipated DC Date:  09/03/2012   Anticipated DC Plan:  HOME W HOME HEALTH SERVICES      DC Planning Services  CM consult      [facility] Choice  HOME HEALTH   Choice offered to / List presented to:          [facility] arranged  HH-1 RN  IV Antibiotics      HH agency  [facility]   Status of service:  Completed, signed off Medicare Important Message given?   (If response is "NO", the following Medicare IM given date fields will be blank) Date Medicare IM given:   Date Additional Medicare IM given:    Discharge Disposition:  HOME W HOME HEALTH SERVICES  Per UR Regulation:    If discussed at Long Length of Stay Meetings, dates discussed:    Comments:

## 2012-09-03 NOTE — Progress Notes (Signed)
Daily Progress Note Family Medicine Teaching Service Service Pager: [phone]  Subjective: Doing well. Going home today   Objective: Vital signs in last 24 hours: Temp:  [97.2 F (36.2 C)-98.7 F (37.1 C)] 97.2 F (36.2 C) (03/25 0800) Pulse Rate:  [66-73] 73 (03/25 0800) Resp:  [18] 18 (03/25 0800) BP: (150-170)/(72-86) 150/72 mmHg (03/25 0800) SpO2:  [98 %-100 %] 98 % (03/25 0800) Weight change:  Last BM Date: 09/02/12  Intake/Output from previous day: 03/24 0701 - 03/25 0700 In: 240 [P.O.:240] Out: 200 [Urine:200] Intake/Output this shift: Total I/O In: 250 [P.O.:250] Out: -   General appearance: NAD, laying comfortably in bed Resp: CTAB, no wheezes or crackles Cardio: rrr, no mrg appreciated GI: S, NT, ND, +BS Extremities: 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 Bandage in place on right foot, C/D/I. NT on exam.  Lab Results: No results found for this basename: WBC, HGB, HCT, PLT,  in the last 72 hours BMET  Recent Labs  09/01/12 0500  NA 134*  K 3.8  CL 101  CO2 23  GLUCOSE 230*  BUN 12  CREATININE 0.83  CALCIUM 9.0    Studies/Results: Mr Foot Right W Wo Contrast  08/31/2012 IMPRESSION:  1.  Osteomyelitis of the fifth metatarsal base with a sinus tract draining to the scan. 2.  Low-level edema and enhancement in the third metatarsal head suggesting early osteomyelitis.  The fifth metatarsal head has a relatively normal marrow appearance. 3.  Diffuse subcutaneous edema and enhancement most confluent along the medial foot and lateral foot, favoring cellulitis.  Myositis is suspected along the plantar musculature of the foot.  No deep soft tissue abscess observed in the forefoot.   Original Report Authenticated By: Gaylyn Rong, M.D.     Medications:  I have reviewed the patient's current medications. Scheduled: . gabapentin  300 mg Oral TID  . heparin  5,000 Units Subcutaneous Q8H  . insulin aspart  0-15 Units Subcutaneous TID WC  . insulin  aspart  0-5 Units Subcutaneous QHS  . lisinopril  20 mg Oral Daily  . piperacillin-tazobactam (ZOSYN)  IV  3.375 g Intravenous Q8H  . polyethylene glycol  17 g Oral Daily  . vancomycin  1,750 mg Intravenous Q12H   Continuous: . sodium chloride 100 mL/hr at 09/03/12 0520   AVW:[id number], acetaminophen, bisacodyl, HYDROcodone-acetaminophen, ondansetron (ZOFRAN) IV, ondansetron, sodium chloride, traZODone  Assessment/Plan: Steven [name] is a 52 y.o. 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 Xray revealed osteomyelitis of the fifth metatarsal head and fifth metatarsal base.   # Osteomyeliltis - No leukocytosis or systemic signs of infection.  - Will go home on Levaquin and Vanc with PICC in place with home health RN for dosing x 14 days.  - will need f/u with Dr. Lajoyce Corners in 1 week - MRI does not show abscess, therefore doing foot soaks per Ortho. - Orthopedics consulted. Appreciate their input and excellent care of this mutual patient - ABI of 1 in right leg and 0.87 in left leg, will need vascular in outpatient setting  - Will continue to monitor closely  # DM-2, uncontrolled with peripheral neuropathy  - Will continue home Gabapentin.  - SSI, increased to moderate. - CBG's TID AC and QHS  - A1c elevated significantly from prior check.  Likely due to compliance issues. -  He has very poor understanding of his disease and further consequences of not caring for himself. Will have diabetes educator see.  - plan to not discharge on insulin-patient on glipizide 5 mg BID at home, will consider discharging on metformin with close f/u with PCP  # HTN-stable - Continuing home lisinopril 20 mg  # Alcohol abuse  - CIWA protocol   # Constipation - Add Miralax and stool softener daily  FEN/GI: NS @ 100 mL/hr; Carb modified diet   Prophylaxis: Heparin SQ  Disposition:  home on IV antibiotics today  Code Status: Full code   LOS: 4 days   Gildardo Cranker 09/03/2012, 9:51 AM

## 2012-09-03 NOTE — Progress Notes (Signed)
1400 dose of IV Zosyn not given per Gildardo Cranker, DO. Pt discharged.

## 2012-09-09 IMAGING — CR DG CHEST 2V
1 series · 1 of 1 positions shown · non-contrast
Comparison: 06/06/2009

CLINICAL DATA: Postop, shortness of breath

CHEST - 2 VIEW

[view not recorded]
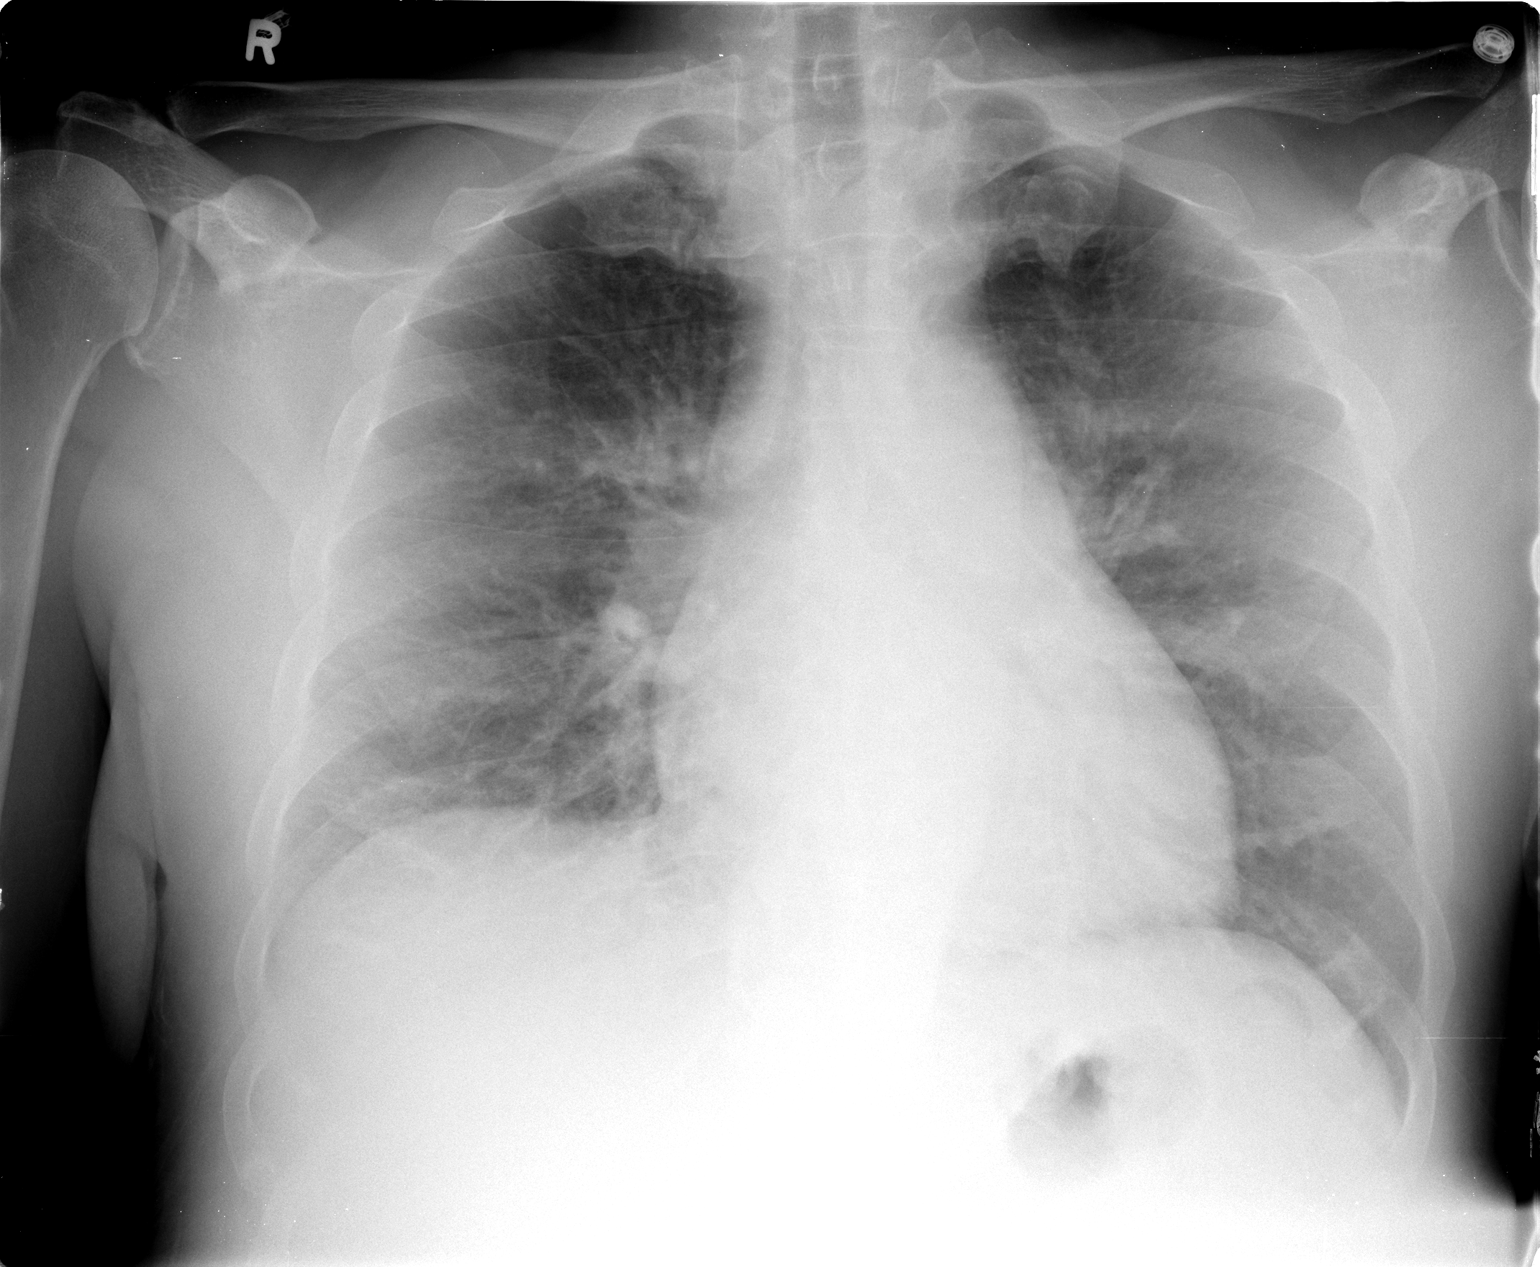

[1 of 1 positions shown; findings below may reference images not displayed]

FINDINGS: Lungs are essentially clear. No pleural effusion or
pneumothorax.

Cardiomegaly.

Degenerative changes of the visualized thoracolumbar spine.
IMPRESSION: No evidence of acute cardiopulmonary disease.

Cardiomegaly.

## 2012-09-12 ENCOUNTER — Ambulatory Visit (INDEPENDENT_AMBULATORY_CARE_PROVIDER_SITE_OTHER): Payer: Medicaid Other | Admitting: Family Medicine

## 2012-09-12 ENCOUNTER — Encounter: Payer: Self-pay | Admitting: Family Medicine

## 2012-09-12 VITALS — BP 124/81 | HR 98 | Ht 73.0 in | Wt 223.8 lb

## 2012-09-12 DIAGNOSIS — E1165 Type 2 diabetes mellitus with hyperglycemia: Secondary | ICD-10-CM

## 2012-09-12 DIAGNOSIS — M869 Osteomyelitis, unspecified: Secondary | ICD-10-CM

## 2012-09-12 MED ORDER — GLUCOSE BLOOD VI STRP: ORAL_STRIP | Status: DC

## 2012-09-12 MED ORDER — LISINOPRIL 20 MG PO TABS: 20.0000 mg | ORAL_TABLET | Freq: Every day | ORAL | Status: DC

## 2012-09-12 NOTE — Patient Instructions (Addendum)
Thank you for coming in today, it was good to see you Keep your follow up with Dr. Lajoyce Corners Check your glucose and record to bring with you when you follow up with me in 2 weeks.

## 2012-09-15 NOTE — Assessment & Plan Note (Addendum)
Poorly controlled.  Currently only on glipizide. Will send in strips refil and see what his glucose is running when he returns in two weeks.  I think  He will probably  need insulin therapy but will require teaching at pharmacy clinic.

## 2012-09-15 NOTE — Assessment & Plan Note (Signed)
Stable, still on antibiotics and doing daily foot soaks.  He has a follow up with Dr. Lajoyce Corners on 4/7.

## 2012-09-15 NOTE — Progress Notes (Signed)
  Subjective:    Patient ID: Nunzio Cory, male    DOB: [birth year], 52 y.o.   MRN: [id number]  Diabetes    1. Hospital F/u:  Here for hospital f/u after recent hospitalization for OM of the foot.  Reports that he is doing well.  He is currently taking levaquin and is self administering vancomycin through a picc line.  Lab work is being monitored by [facility].  He is also doing daily dial soap soaks of the foot.  He has a follow up with Dr. Lajoyce Corners on 4/7.  He denies fever, chills, pain.  2.  DM:  Has not been monitoring sugars since d/c from hospital because he has been out of test strips.  Currently using glipizide only. Denies polyuria, polydipsia   Review of Systems Per HPI    Objective:   Physical Exam  Constitutional: He appears well-nourished. No distress.  In motorized wheelchair   Musculoskeletal:  R foot wrapped and dressing is CDI.            Assessment & Plan:

## 2012-09-16 ENCOUNTER — Telehealth: Payer: Self-pay | Admitting: Family Medicine

## 2012-09-16 NOTE — Telephone Encounter (Signed)
Patient called because he said his pharmacy did not have his prescription for his test strips ready when he went by.   I called the pharmacy and they had them ready as of yesterday.

## 2012-09-20 ENCOUNTER — Other Ambulatory Visit: Payer: Self-pay | Admitting: *Deleted

## 2012-09-20 MED ORDER — GLUCOSE BLOOD VI STRP: ORAL_STRIP | Status: DC

## 2012-09-26 ENCOUNTER — Encounter: Payer: Self-pay | Admitting: Family Medicine

## 2012-09-26 ENCOUNTER — Ambulatory Visit (INDEPENDENT_AMBULATORY_CARE_PROVIDER_SITE_OTHER): Payer: Medicaid Other | Admitting: Family Medicine

## 2012-09-26 VITALS — BP 150/96 | HR 92 | Ht 73.0 in | Wt 224.0 lb

## 2012-09-26 DIAGNOSIS — I10 Essential (primary) hypertension: Secondary | ICD-10-CM

## 2012-09-26 DIAGNOSIS — M869 Osteomyelitis, unspecified: Secondary | ICD-10-CM

## 2012-09-26 DIAGNOSIS — E119 Type 2 diabetes mellitus without complications: Secondary | ICD-10-CM

## 2012-09-26 DIAGNOSIS — IMO0001 Reserved for inherently not codable concepts without codable children: Secondary | ICD-10-CM

## 2012-09-26 DIAGNOSIS — E1165 Type 2 diabetes mellitus with hyperglycemia: Secondary | ICD-10-CM

## 2012-09-26 DIAGNOSIS — Z716 Tobacco abuse counseling: Secondary | ICD-10-CM

## 2012-09-26 DIAGNOSIS — Z7189 Other specified counseling: Secondary | ICD-10-CM

## 2012-09-26 DIAGNOSIS — F17200 Nicotine dependence, unspecified, uncomplicated: Secondary | ICD-10-CM

## 2012-09-26 MED ORDER — GLIPIZIDE 5 MG PO TABS: 5.0000 mg | ORAL_TABLET | Freq: Two times a day (BID) | ORAL | Status: DC

## 2012-09-26 MED ORDER — FLUTICASONE PROPIONATE 50 MCG/ACT NA SUSP: 2.0000 | Freq: Every day | NASAL | Status: DC

## 2012-09-26 NOTE — Patient Instructions (Addendum)
General Instructions:  Follow up with me in one month so we can recheck your blood pressure and bring a log of your sugars or your meter.  Try to quit smoking  Treatment Goals:  Goals (1 Years of Data) as of 09/26/12         As of Today 09/12/12 09/03/12 09/03/12 09/03/12     Blood Pressure    . Blood Pressure < 130/80  150/96 124/81 160/83 158/75 150/72     Result Component    . HEMOGLOBIN A1C < 7.0            Progress Toward Treatment Goals:  Treatment Goal 09/26/2012  Hemoglobin A1C unchanged  Blood pressure deteriorated  Stop smoking smoking the same amount    Self Care Goals & Plans:  Self Care Goal 09/26/2012  Manage my medications take my medicines as prescribed; refill my medications on time; bring my medications to every visit  Monitor my health keep track of my blood glucose; bring my glucose meter and log to each visit; keep track of my blood pressure; check my feet daily  Eat healthy foods drink diet soda or water instead of juice or soda; eat foods that are low in salt; eat more vegetables; eat baked foods instead of fried foods  Stop smoking go to the [employer] ([email or web address]); set a quit date and stop smoking    Home Blood Glucose Monitoring 09/26/2012  Check my blood sugar 2 times a day  When to check my blood sugar before breakfast; at bedtime     Care Management & Community Referrals:  Referral 09/26/2012  Referrals made for care management support health educator

## 2012-09-29 NOTE — Assessment & Plan Note (Signed)
Still receiving Vancomycin through PICC, completed levaquin.  Being followed by Dr. Lajoyce Corners.

## 2012-09-29 NOTE — Assessment & Plan Note (Signed)
Poorly controlled previously, possible better controlled now given reported numbers.  Will have him record numbers or bring meter and follow up in one month.  No changes to medications at this time.

## 2012-09-29 NOTE — Progress Notes (Signed)
  Subjective:    Patient ID: Steven [name], male    DOB: 1960-07-25, 52 y.o.   MRN: [id number]  HPI  1. CHRONIC DIABETES  Disease Monitoring  Blood Sugar Ranges: Reports glucose averaging around 150 fasting.   Polyuria: no   Visual problems: no   Medication Compliance: yes  Medication Side Effects  Hypoglycemia: no   Preventitive Health  Eye Exam: Had last year, unsure of date.  Retina scanner today.   Foot Exam: Currently being treated for OM of R foot, known neuropathy and venous insufficiency.    Diet pattern:  Reports that he is eating more baked foods but also eating a lot of carbs including breads and pastas  Exercise: Minimal, sedentary due to current food infection.    2. HTN:  BP elevated today, well controlled at last visit.  He has taken his lisinopril today.  Does not monitor BP at home.  No symptoms of low blood pressure, chest pain, headache, palpitations, vision changes.      Review of Systems Per HPI    Objective:   Physical Exam  Constitutional: He appears well-nourished. No distress.  In powered wheel chair   HENT:  Head: Normocephalic and atraumatic.  Cardiovascular: Normal rate and regular rhythm.   Musculoskeletal: He exhibits no edema.  R foot dressed. CDI, non tender.  L foot with callused areas. Oncychomycosis present bilataerally.   Skin:  PICC site without erythema or induration.           Assessment & Plan:

## 2012-09-29 NOTE — Assessment & Plan Note (Signed)
BP elevated today, controlled at last appointment.  Will recheck at next appointment, if still elevated will consider increasing lisinopril vs addition of additional agent.

## 2012-10-17 ENCOUNTER — Encounter: Payer: Self-pay | Admitting: *Deleted

## 2012-10-17 ENCOUNTER — Encounter: Payer: Medicaid Other | Attending: Family Medicine | Admitting: *Deleted

## 2012-10-17 VITALS — Ht 76.0 in | Wt 226.2 lb

## 2012-10-17 DIAGNOSIS — E119 Type 2 diabetes mellitus without complications: Secondary | ICD-10-CM | POA: Insufficient documentation

## 2012-10-17 DIAGNOSIS — E1165 Type 2 diabetes mellitus with hyperglycemia: Secondary | ICD-10-CM

## 2012-10-17 DIAGNOSIS — Z713 Dietary counseling and surveillance: Secondary | ICD-10-CM | POA: Insufficient documentation

## 2012-10-17 NOTE — Progress Notes (Signed)
  Medical Nutrition Therapy:  Appt start time: 1645 end time:  1745.  Assessment:  Primary concerns today: new diagnosis of diabetes  Lives alone, cooks own meals and own shopping for food. SMBG in AM usually, states current range is around 150 mg/dl.    MEDICATIONS: see list   DIETARY INTAKE:  Usual eating pattern includes 2-3 meals and 0-1 snacks per day.  Everyday foods include mostly meats and starches, not many vegetables or fruits.  Avoided foods include: none stated.    24-hr recall:  B ( AM): eggs and meat, diluted OJ  Snk ( AM): cheese crackers, more diluted OJ or water  L ( PM): skips occasionally OR cheeseburger on bun, hush puppies or fries, regular soda Snk ( PM): none D ( PM): baked meats, beans, rice, not usually any vegetables, drinks water or whole milk Snk ( PM): none unless wakes up in middle of night and gets a snack of left overs Beverages: diluted OJ, regular soda, water or whole milk  Usual physical activity: limited due to foot problems  Estimated energy needs: 1600 calories 180 g carbohydrates 120 g protein 44 g fat  Progress Towards Goal(s):  In progress.   Nutritional Diagnosis:  NB-1.1 Food and nutrition-related knowledge deficit As related to diabetes management.  As evidenced by A1c of 10.5 % on 08/30/2012.    Intervention:  Nutrition counseling and diabetes education initiated. Discussed basic physiology of diabetes, SMBG and rationale of checking BG at alternate times of day, A1c, Carb Counting and reading food labels, and benefits of increased activity.   Handouts given during visit include: Living Well with Diabetes Carb Counting and Food Label handouts Meal Plan Card  Monitoring/Evaluation:  Dietary intake, exercise, reading food labels, and body weight in 1 month(s).

## 2012-10-18 ENCOUNTER — Ambulatory Visit: Payer: Medicaid Other | Admitting: Dietician

## 2012-10-25 ENCOUNTER — Encounter: Payer: Self-pay | Admitting: Family Medicine

## 2012-10-25 ENCOUNTER — Ambulatory Visit (INDEPENDENT_AMBULATORY_CARE_PROVIDER_SITE_OTHER): Payer: Medicaid Other | Admitting: Family Medicine

## 2012-10-25 VITALS — BP 170/93 | HR 100 | Temp 98.4°F | Ht 76.0 in | Wt 223.6 lb

## 2012-10-25 DIAGNOSIS — R059 Cough, unspecified: Secondary | ICD-10-CM

## 2012-10-25 DIAGNOSIS — Z7189 Other specified counseling: Secondary | ICD-10-CM

## 2012-10-25 DIAGNOSIS — R05 Cough: Secondary | ICD-10-CM

## 2012-10-25 DIAGNOSIS — F17200 Nicotine dependence, unspecified, uncomplicated: Secondary | ICD-10-CM

## 2012-10-25 DIAGNOSIS — M869 Osteomyelitis, unspecified: Secondary | ICD-10-CM

## 2012-10-25 DIAGNOSIS — Z716 Tobacco abuse counseling: Secondary | ICD-10-CM

## 2012-10-25 DIAGNOSIS — IMO0002 Reserved for concepts with insufficient information to code with codable children: Secondary | ICD-10-CM

## 2012-10-25 DIAGNOSIS — E1165 Type 2 diabetes mellitus with hyperglycemia: Secondary | ICD-10-CM

## 2012-10-25 DIAGNOSIS — I10 Essential (primary) hypertension: Secondary | ICD-10-CM

## 2012-10-25 MED ORDER — LISINOPRIL-HYDROCHLOROTHIAZIDE 20-25 MG PO TABS: 1.0000 | ORAL_TABLET | Freq: Every day | ORAL | Status: DC

## 2012-10-25 NOTE — Progress Notes (Signed)
  Subjective:    Patient ID: Steven [name], male    DOB: 1960-12-23, 52 y.o.   MRN: [id number]  HPI  1. [id number] DIABETES  Disease Monitoring  Blood Sugar Ranges: 80-120, but does not check very often.  Improved with treatment of OM  Polyuria: no   Visual problems: no   Medication Compliance: yes  Medication Side Effects  Hypoglycemia: no     2. OM:  OM of R foot.  Recently finished prolonged course of vancomycin.  He is being followed by Dr. Lajoyce Corners at [facility] ortho.  Pain is improved, still doing dressing changes.  3. HTN:  CHRONIC HYPERTENSION  Disease Monitoring  Blood pressure range: No self montioring at home.  Chest pain: no   Dyspnea: no   Claudication: no   Medication compliance: yes  Medication Side Effects  Lightheadedness: no   Urinary frequency: no   Edema: no   Preventitive Healthcare:  Exercise: no   Diet Pattern: No dietary or salt restrictions   4.  Cough:  Reports cough for the past 4-5 days. He also has nasal congestion and scratchy throat.  Has had this before and flonase has been helpful but he has not tried this yet.  He does also continue to smoke  Past Medical History  Diagnosis Date  . DM (diabetes mellitus)   . HTN (hypertension)   . History of DVT (deep vein thrombosis)   . Personal history of diabetic foot ulcer   . Peripheral vascular disease   . Peripheral neuropathy 11/19/2011  . Personal history of diabetic foot ulcer   . Insomnia   . Sinus congestion   . Varicose vein     legs  . Osteomyelitis of foot, right, acute 08/30/2012     Review of Systems Per HPI    Objective:   Physical Exam  Constitutional: He appears well-nourished. No distress.  HENT:  Head: Normocephalic and atraumatic.  Mouth/Throat: No oropharyngeal exudate.  Turbinates boggy  Neck: Neck supple.  Cardiovascular: Normal rate and regular rhythm.   Pulmonary/Chest: Effort normal and breath sounds normal. No respiratory distress.  Musculoskeletal: He  exhibits no edema.          Assessment & Plan:

## 2012-10-25 NOTE — Patient Instructions (Addendum)
Thank you for coming in today, it was good to see you Use the flonase for your congestion I am going to change your lisinopril to a new medication called lisinopril/hctz, pick this up.  Follow up with me in 4-6 weeks to recheck your blood pressure and check lab work.

## 2012-10-27 DIAGNOSIS — R05 Cough: Secondary | ICD-10-CM | POA: Insufficient documentation

## 2012-10-27 DIAGNOSIS — R059 Cough, unspecified: Secondary | ICD-10-CM | POA: Insufficient documentation

## 2012-10-27 NOTE — Assessment & Plan Note (Signed)
Discussed tobacco cessation 

## 2012-10-27 NOTE — Assessment & Plan Note (Signed)
Reports better control of glucose.  Will check A1c again in 2 months.

## 2012-10-27 NOTE — Assessment & Plan Note (Signed)
Followed by Dr. Lajoyce Corners, recently completed prolonged course of vancomycin

## 2012-10-27 NOTE — Assessment & Plan Note (Signed)
Likely related to allergies vs viral illness.  Will have him restart flonase.  Follow up if not improving.

## 2012-10-27 NOTE — Assessment & Plan Note (Signed)
BP not well controlled, will change lisinopril to lisinopril/hctz.  FOllow up in 4 weeks.

## 2012-11-18 ENCOUNTER — Ambulatory Visit: Payer: Medicaid Other | Admitting: *Deleted

## 2012-11-22 ENCOUNTER — Ambulatory Visit: Payer: Medicaid Other | Admitting: Family Medicine

## 2012-12-06 ENCOUNTER — Encounter: Payer: Self-pay | Admitting: Family Medicine

## 2012-12-06 ENCOUNTER — Ambulatory Visit (INDEPENDENT_AMBULATORY_CARE_PROVIDER_SITE_OTHER): Payer: Medicaid Other | Admitting: Family Medicine

## 2012-12-06 VITALS — BP 106/71 | HR 73 | Ht 73.0 in

## 2012-12-06 DIAGNOSIS — E785 Hyperlipidemia, unspecified: Secondary | ICD-10-CM

## 2012-12-06 DIAGNOSIS — I10 Essential (primary) hypertension: Secondary | ICD-10-CM

## 2012-12-06 DIAGNOSIS — E1165 Type 2 diabetes mellitus with hyperglycemia: Secondary | ICD-10-CM

## 2012-12-06 LAB — BASIC METABOLIC PANEL
CO2: 23 mEq/L (ref 19–32)
Chloride: 98 mEq/L (ref 96–112)
Glucose, Bld: 300 mg/dL — ABNORMAL HIGH (ref 70–99)
Potassium: 4.4 mEq/L (ref 3.5–5.3)
Sodium: 131 mEq/L — ABNORMAL LOW (ref 135–145)

## 2012-12-06 LAB — POCT GLYCOSYLATED HEMOGLOBIN (HGB A1C): Hemoglobin A1C: 10.7

## 2012-12-06 MED ORDER — ATORVASTATIN CALCIUM 20 MG PO TABS: 20.0000 mg | ORAL_TABLET | Freq: Every day | ORAL | Status: DC

## 2012-12-06 MED ORDER — ASPIRIN 81 MG PO TABS: 81.0000 mg | ORAL_TABLET | Freq: Every day | ORAL | Status: DC

## 2012-12-06 NOTE — Patient Instructions (Addendum)
Thank you for coming in today, it was good to see you Start taking the lipitor for cholesterol Also start taking an 81mg  baby aspirin daily Your blood pressure looks good today.  Follow up in 2-3 months

## 2012-12-08 DIAGNOSIS — E785 Hyperlipidemia, unspecified: Secondary | ICD-10-CM | POA: Insufficient documentation

## 2012-12-08 NOTE — Assessment & Plan Note (Signed)
BP well controlled with lisinopril/hctz.  Recheck renal function and electrolytes today

## 2012-12-08 NOTE — Assessment & Plan Note (Addendum)
Has remained poorly controlled on oral medications. Poorly compliant with checking glucose and with diet.  Have had discussion with him regarding insulin in the past to which he has beenresistant to. Will have him f/u with his new PCP to discuss trying to get better glycemic control  .  Needs eye exam, referral placed.  ASA 81mg  daily for primary prevention of MI and CVA.

## 2012-12-08 NOTE — Assessment & Plan Note (Signed)
Has not been using lipitor as previously prescribed, will send back in.

## 2012-12-08 NOTE — Progress Notes (Signed)
  Subjective:    Patient ID: Steven [name], male    DOB: 09/06/1960, 52 y.o.   MRN: [id number]  Hypertension    1. CHRONIC HYPERTENSION  Disease Monitoring  Blood pressure range: No self monitoring at home  Chest pain: no   Dyspnea: no   Claudication: no   Medication compliance: yes, tolerating recent change in medication.   Medication Side Effects  Lightheadedness: no   Urinary frequency: no   Edema: no   Impotence: yes   Preventitive Healthcare:  Exercise: no   Diet Pattern: No restrictions  Salt Restriction: Does try to limit but is not sure what to limit to.  2. CHRONIC DIABETES  Disease Monitoring  Blood Sugar Ranges: Has not been checking lately  Polyuria: no   Visual problems: no   Medication Compliance: yes  Medication Side Effects  Hypoglycemia: no   Preventitive Health Care  Eye Exam: Needs referral  Foot Exam: done today   3. HLD:  Has not been taking lipitor, he is unsure why.  Past Medical History  Diagnosis Date  . DM (diabetes mellitus)   . HTN (hypertension)   . History of DVT (deep vein thrombosis)   . Personal history of diabetic foot ulcer   . Peripheral vascular disease   . Peripheral neuropathy 11/19/2011  . Personal history of diabetic foot ulcer   . Insomnia   . Sinus congestion   . Varicose vein     legs  . Osteomyelitis of foot, right, acute 08/30/2012      Review of Systems Per HPI    Objective:   Physical Exam  Constitutional: He appears well-nourished. No distress.  In power wheel chair   HENT:  Mouth/Throat: Oropharynx is clear and moist.  Cardiovascular: Normal rate, regular rhythm and normal heart sounds.   Pulmonary/Chest: Effort normal and breath sounds normal.  Musculoskeletal: He exhibits no edema.  See diabetic foot exam   Neurological: He is alert.          Assessment & Plan:

## 2012-12-11 ENCOUNTER — Telehealth: Payer: Self-pay | Admitting: Family Medicine

## 2012-12-11 NOTE — Telephone Encounter (Signed)
Pt advised to sched appt w/new PCP. Pt agreed to make appt.

## 2012-12-11 NOTE — Telephone Encounter (Signed)
Message copied by Barnie Alderman on Wed Dec 11, 2012 11:10 AM ------      Message from: Everrett Coombe      Created: Sun Dec 08, 2012  5:47 PM       Please have patient make appt for 2-3 weeks with new pcp to discuss diabetes ------

## 2012-12-19 ENCOUNTER — Other Ambulatory Visit: Payer: Self-pay

## 2012-12-25 ENCOUNTER — Encounter: Payer: Self-pay | Admitting: Family Medicine

## 2012-12-25 DIAGNOSIS — E11319 Type 2 diabetes mellitus with unspecified diabetic retinopathy without macular edema: Secondary | ICD-10-CM | POA: Insufficient documentation

## 2012-12-27 ENCOUNTER — Ambulatory Visit: Payer: Medicaid Other | Admitting: *Deleted

## 2013-01-15 ENCOUNTER — Telehealth: Payer: Self-pay | Admitting: Family Medicine

## 2013-01-15 NOTE — Telephone Encounter (Signed)
Received request from [facility] in [place] to certify for Medicaid that patient needs evaluation for Personal Care Services. I could not find anything in the chart to support that patient needs in-home PCS services and I have not ever met the patient. I called [facility] to inquire what the request was for, and they said the patient had contacted them requesting services. Informed them that I had not met patient and that he would need to have office visit before I would be able to certify for any necessary services. They will pass this information along to patient.

## 2013-01-31 ENCOUNTER — Other Ambulatory Visit: Payer: Self-pay | Admitting: *Deleted

## 2013-01-31 DIAGNOSIS — E119 Type 2 diabetes mellitus without complications: Secondary | ICD-10-CM

## 2013-02-02 MED ORDER — GLIPIZIDE 5 MG PO TABS: 5.0000 mg | ORAL_TABLET | Freq: Two times a day (BID) | ORAL | Status: DC

## 2013-02-12 ENCOUNTER — Ambulatory Visit: Payer: Medicaid Other | Admitting: Family Medicine

## 2013-02-17 ENCOUNTER — Ambulatory Visit: Payer: Medicaid Other | Admitting: Family Medicine

## 2013-02-27 ENCOUNTER — Ambulatory Visit (INDEPENDENT_AMBULATORY_CARE_PROVIDER_SITE_OTHER): Payer: Medicaid Other | Admitting: Family Medicine

## 2013-02-27 VITALS — BP 126/86 | HR 90 | Ht 73.0 in | Wt 220.0 lb

## 2013-02-27 DIAGNOSIS — Z1211 Encounter for screening for malignant neoplasm of colon: Secondary | ICD-10-CM

## 2013-02-27 DIAGNOSIS — IMO0002 Reserved for concepts with insufficient information to code with codable children: Secondary | ICD-10-CM

## 2013-02-27 DIAGNOSIS — L97509 Non-pressure chronic ulcer of other part of unspecified foot with unspecified severity: Secondary | ICD-10-CM

## 2013-02-27 DIAGNOSIS — E1165 Type 2 diabetes mellitus with hyperglycemia: Secondary | ICD-10-CM

## 2013-02-27 DIAGNOSIS — L97519 Non-pressure chronic ulcer of other part of right foot with unspecified severity: Secondary | ICD-10-CM

## 2013-02-27 DIAGNOSIS — E785 Hyperlipidemia, unspecified: Secondary | ICD-10-CM

## 2013-02-27 DIAGNOSIS — I10 Essential (primary) hypertension: Secondary | ICD-10-CM

## 2013-02-27 DIAGNOSIS — G609 Hereditary and idiopathic neuropathy, unspecified: Secondary | ICD-10-CM

## 2013-02-27 DIAGNOSIS — Z23 Encounter for immunization: Secondary | ICD-10-CM

## 2013-02-27 DIAGNOSIS — G629 Polyneuropathy, unspecified: Secondary | ICD-10-CM

## 2013-02-27 MED ORDER — GABAPENTIN 300 MG PO CAPS: ORAL_CAPSULE | ORAL | Status: DC

## 2013-02-27 NOTE — Patient Instructions (Addendum)
It was nice to meet you today!  I recommend you get a tetanus shot. See the handout on how to schedule your colonoscopy. Please return for an appointment specifically to discuss the wheelchair evaluation.  We are getting an MRI of your foot. If it starts to hurt worse, you have more drainage, fevers, or any other problems please return to the clinic or go to the Emergency Room.  Have the eye doctor send the records to [place]. Call your pharmacy for refills, they should have refills on file there.  You can take two pills of the gabapentin at night for the pain in your legs. Take just one pill at breakfast and one at lunch.  I will call you if your test results are not normal.  Otherwise, I will send you a letter.  If you do not hear from me with in 2 weeks please call our office.      Be well, Dr. Pollie Meyer

## 2013-02-27 NOTE — Assessment & Plan Note (Signed)
Checking direct LDL and CMET today.

## 2013-02-27 NOTE — Assessment & Plan Note (Addendum)
Will check A1c today. Last A1c noted to be poorly controlled (>10) but pt reports getting CBG values better than this would suggest.   Cardiac: on aspirin, statin. Will check direct LDL today. Renal: on ACE inhibitor. Check CMET today. Eye: exam in July 2014 showed diabetic retinopathy, needs f/u visit with ophtho in 6 months. Foot: foot exam done today, has poor sensation on bottom of feet, see progress note re: current foot wound

## 2013-02-27 NOTE — Assessment & Plan Note (Signed)
Well-controlled.  Continue current regimen. 

## 2013-02-27 NOTE — Progress Notes (Signed)
Patient ID: Steven [name], male   DOB: April 11, 1961, 52 y.o.   MRN: [id number]   HPI:  DM: Currently taking glipizide 5mg  BID (although reports two pills BID yesterday due to being out of his BP medicine - counseled not to do this). Last eye exam was 3 months ago at eye doctor, pt will have them send records to [place]. 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 Check CBGs at home - twice a day. States that he never gets above 200. Lowest he has gotten is 85. He checks his sugar before eating in the morning. Denies polyuria or polydipsia. Takes gabapentin 300mg  TID for diabetic peripheral neuropathy. States that his pain has been getting worse at night especially.  HTN: Does not check BP at home. Denies CP or SOB. Occasional headaches. Takes it easy in the morning to not feel like he will pass out. Denies vision changes.  Foot sore: Has sore on the right side of his foot. It has been draining foul smelling pus for about one month. Not painful to him. Denies fevers. Has hx of two prior ray amputations of R foot (1st & 2nd rays).  ROS: See HPI  PMFSH: hx uncontrolled T2DM. Uses motorized wheelchair to get around, requests new one - advised to schedule separate appointment for wheelchair assessment.  PHYSICAL EXAM: BP 126/86  Pulse 90  Ht 6\' 1"  (1.854 m)  Wt 220 lb (99.791 kg)  BMI 29.03 kg/m2 Gen: NAD, in motorized wheelchair Heart: RRR Lungs: CTAB Neuro: grossly nonfocal, speech intact Ext: monofilament exam done - has diminished sensation over both feet on the plantar surface. 2+ DP pulse on left, 1+ DP pulse on R foot. R foot has quarter-sized chronic appearing ulcer on the lateral surface overlying the fifth metatarsal. Appears dry and crusted, but probes at least 1/2 cm into the wound with a swab. Both feet appear chronically dry, with onychomycosis of the toe nails. No sores noted on L foot.  ASSESSMENT/PLAN:  # Health  maintenance:  -has never had colonoscopy, will refer to GI for screening colonoscopy (since has medicaid and needs referral) -recommend tetanus shot to patient -flu shot given today  # Foot wound: Concern for chronic osteomyelitis. Probes fairly deeply, but unable to tell if it probes to bone. Has prior hx of osteomyelitis in this same foot, requiring two separate amputations. Orthopedist is Dr. Lajoyce Corners.  -Will obtain MRI of R foot without contrast to evaluate for osteo.  -Also check CRP -Pt seen and examined by Dr. Mauricio Po as well, who agrees with this plan.   # See also problem based charting - f/u in 1-2 weeks for wheelchair assessment

## 2013-02-27 NOTE — Assessment & Plan Note (Signed)
Will increase QHS dose of gabapentin from 300mg  to 600mg  to see if he will have better control. Keep morning and lunchtime doses the same (300mg ).

## 2013-02-28 LAB — C-REACTIVE PROTEIN: CRP: <0.5 mg/dL (ref <0.60)

## 2013-02-28 LAB — COMPREHENSIVE METABOLIC PANEL
AST: 17 U/L (ref 0–37)
Albumin: 4 g/dL (ref 3.5–5.2)
BUN: 15 mg/dL (ref 6–23)
CO2: 26 mEq/L (ref 19–32)
Calcium: 10 mg/dL (ref 8.4–10.5)
Chloride: 98 mEq/L (ref 96–112)
Glucose, Bld: 319 mg/dL — ABNORMAL HIGH (ref 70–99)
Potassium: 4.3 mEq/L (ref 3.5–5.3)

## 2013-02-28 LAB — LDL CHOLESTEROL, DIRECT: Direct LDL: 80 mg/dL

## 2013-03-06 ENCOUNTER — Other Ambulatory Visit: Payer: Self-pay | Admitting: Family Medicine

## 2013-03-06 ENCOUNTER — Telehealth: Payer: Self-pay | Admitting: Family Medicine

## 2013-03-06 MED ORDER — ATORVASTATIN CALCIUM 40 MG PO TABS: 40.0000 mg | ORAL_TABLET | Freq: Every day | ORAL | Status: DC

## 2013-03-06 NOTE — Telephone Encounter (Addendum)
Called patient to discuss hemoglobin A1c result of 11.4, and that my recommendation is for him to start insulin. He is agreeable and reports he is able to come to the clinic tomorrow after his scheduled MRI, to meet with Dr. Raymondo Band and receive teaching on insulin therapy. Will notify front office so that pt can be scheduled for this appointment. Also discussed case with Dr. Raymondo Band.   Additionally, pt reports he is feeling well, denies fevers/illness. Advised that if he were to have those this weekend, to go to ER for evaluation (given concern for osteomyelitis, which is why we are getting the MRI).  Also advised that his cholesterol was a little high, so we should increase his lipitor. Will send in new rx for him for lipitor 40mg  daily.  Latrelle Dodrill, MD

## 2013-03-07 ENCOUNTER — Other Ambulatory Visit: Payer: Self-pay | Admitting: Family Medicine

## 2013-03-07 ENCOUNTER — Encounter: Payer: Self-pay | Admitting: Pharmacist

## 2013-03-07 ENCOUNTER — Ambulatory Visit (HOSPITAL_COMMUNITY)
Admission: RE | Admit: 2013-03-07 | Discharge: 2013-03-07 | Disposition: A | Discharge status: Home / Self Care | Payer: Medicaid Other | Source: Ambulatory Visit | Attending: Family Medicine | Admitting: Family Medicine

## 2013-03-07 ENCOUNTER — Ambulatory Visit (INDEPENDENT_AMBULATORY_CARE_PROVIDER_SITE_OTHER): Payer: Medicaid Other | Admitting: Pharmacist

## 2013-03-07 VITALS — BP 103/71 | HR 97

## 2013-03-07 DIAGNOSIS — L97519 Non-pressure chronic ulcer of other part of right foot with unspecified severity: Secondary | ICD-10-CM

## 2013-03-07 DIAGNOSIS — IMO0001 Reserved for inherently not codable concepts without codable children: Secondary | ICD-10-CM

## 2013-03-07 DIAGNOSIS — E1165 Type 2 diabetes mellitus with hyperglycemia: Secondary | ICD-10-CM

## 2013-03-07 DIAGNOSIS — X58XXXA Exposure to other specified factors, initial encounter: Secondary | ICD-10-CM | POA: Insufficient documentation

## 2013-03-07 DIAGNOSIS — M869 Osteomyelitis, unspecified: Secondary | ICD-10-CM | POA: Insufficient documentation

## 2013-03-07 DIAGNOSIS — S92309A Fracture of unspecified metatarsal bone(s), unspecified foot, initial encounter for closed fracture: Secondary | ICD-10-CM | POA: Insufficient documentation

## 2013-03-07 MED ORDER — GADOBENATE DIMEGLUMINE 529 MG/ML IV SOLN
20.0000 mL | Freq: Once | INTRAVENOUS | Status: AC | PRN
  Administered 2013-03-07: 20 mL via INTRAVENOUS

## 2013-03-07 MED ORDER — INSULIN GLARGINE 100 UNIT/ML SOLOSTAR PEN: 15.0000 [IU] | PEN_INJECTOR | Freq: Every morning | SUBCUTANEOUS | Status: DC

## 2013-03-07 NOTE — Progress Notes (Signed)
S:    Patient arrives for his diabetes management visit using a motorized wheelchair. Patient reports having a long standing history of diabetes. He reports not eating very regularly. He has difficulties swallowing and drink ~80 ounces of beer every day and doesn't feel like eating.   O:  . Lab Results  Component Value Date   HGBA1C 11.4 02/27/2013     home fasting CBG readings of 180-250. The lowest was 85.   He does not take post prandial BG.   A/P: Patient has a long standing history of diabetes and is currently uncontrolled. Denies hypoglycemic events and is able to verbalize appropriate hypoglycemia management plan.  Reports adherence with medication. Control is suboptimal due to erratic diet, excessive drinking, and possible medication adherence. Started basal insulin Lantus (insulin glargine) 15 units every morning. Written patient instructions provided.  Follow up in Pharmacist Clinic Visit in one week. Total time in face to face counseling 40 minutes.  Patient seen with Valene Bors, PharmD Candidate,  Anthony Sar, PharmD Resident, and Georga Kaufmann, PharmD Resident.

## 2013-03-07 NOTE — Patient Instructions (Addendum)
Thanks for coming in today.  We started Lantus 15 units every morning.   Please make an appointment with the pharmacy clinic to come back to see [place] in one week (03/14/13). Bring your blood sugar numbers.

## 2013-03-12 ENCOUNTER — Ambulatory Visit: Payer: Medicaid Other | Admitting: Family Medicine

## 2013-03-14 ENCOUNTER — Encounter: Payer: Self-pay | Admitting: Pharmacist

## 2013-03-14 ENCOUNTER — Ambulatory Visit (INDEPENDENT_AMBULATORY_CARE_PROVIDER_SITE_OTHER): Payer: Medicaid Other | Admitting: Pharmacist

## 2013-03-14 VITALS — BP 106/57 | HR 89

## 2013-03-14 DIAGNOSIS — Z7189 Other specified counseling: Secondary | ICD-10-CM

## 2013-03-14 DIAGNOSIS — Z716 Tobacco abuse counseling: Secondary | ICD-10-CM

## 2013-03-14 DIAGNOSIS — E1165 Type 2 diabetes mellitus with hyperglycemia: Secondary | ICD-10-CM

## 2013-03-14 DIAGNOSIS — F17200 Nicotine dependence, unspecified, uncomplicated: Secondary | ICD-10-CM

## 2013-03-14 NOTE — Assessment & Plan Note (Signed)
Diabetes currently uncontrolled.  Denies hypoglycemic events.  Reports adherence with medication. Control is suboptimal due to his diet and alcohol intake. Increased dose of basal insulin Lantus (insulin glargine) to 20 units daily.  Patient was educated on risk of loosing foot and smoking.  He was encouraged to consider quitting smoking. He was was also encouraged to continue food intake rather than alcohol.  Written patient instructions provided. Total time in face to face counseling 40 minutes.  Patient seen with Piedad Climes, PharmD Resident, and Georga Kaufmann, PharmD Resident.

## 2013-03-14 NOTE — Patient Instructions (Addendum)
Increase Lantus dose to 20 units daily.  Continue to elevate your foot when resting.  Make sure to eat consistent meals.   Consider quitting smoking.   Follow-up with Dr. Pollie Meyer in 2 weeks so she can look at your foot and we can give you more Lantus.

## 2013-03-14 NOTE — Assessment & Plan Note (Signed)
Patient remains precontemplative RE tobacco cessation.  Patient encouraged to consider in the near future.  Offered assistance in the future if interested.

## 2013-03-14 NOTE — Progress Notes (Signed)
S:    Patient arrives in his motorized wheelchair.  Presents for diabetes follow-up.  Patient has taken glipizide since August 2014 and was started on Lantus at his last pharmacy clinic visit a week ago. He reports smoking 1/2 ppd and drinking beer (about a 6-pack per day). He reports that he has started eating breakfast (eggs and bacon), lunch (sandwich with chips), and sometimes dinner (roast with rice and gravy).  He does not exercise due to his limited mobility.  He is eager to finish the visit and go home.   O:  . Lab Results  Component Value Date   HGBA1C 11.4 02/27/2013     home fasting CBG readings of 240s (patient reported).  He did not bring meter with him today. Physical exam of right foot deferred to Dr. Armen Pickup.  A/P: Diabetes currently uncontrolled.  Denies hypoglycemic events.  Reports adherence with medication. Control is suboptimal due to his diet and alcohol intake. Increased dose of basal insulin Lantus (insulin glargine) to 20 units daily.  Patient was educated on risk of loosing foot and smoking.  He was encouraged to consider quitting smoking. He was was also encouraged to continue food intake rather than alcohol.  Written patient instructions provided. Total time in face to face counseling 40 minutes.  Patient seen with Piedad Climes, PharmD Resident, and Georga Kaufmann, PharmD Resident. Marland Kitchen

## 2013-03-17 NOTE — Progress Notes (Addendum)
Patient ID: KAELAN AMBLE, male   DOB: 1960-11-24, 52 y.o.   MRN: [id number] Patient's R foot ulcer in a diabetic evaluated.  S: mild TTP O: R foot 5th metatarsal base:  1.5 x 1.5 raised circular callus with surrounding hyperkeratosis. No erythema, edema, or fluctuance.  2+ DP pulse.   MRI 03/07/13:  IMPRESSION:  1. Persistent mild abnormal signal in the 5th metatarsal base  consistent with reactive edema and treated osteomyelitis. The  overlying ulceration shows granulation tissue.  2. Interval fracture of the distal 3rd metatarsal.  A/P: Continue elevation Pain control Recommend patient to PCP to discuss long term foot care, good candidate for podiatry.

## 2013-03-18 NOTE — Progress Notes (Signed)
Patient ID: Steven [name], male   DOB: 07/05/1960, 52 y.o.   MRN: [id number] Reviewed: Agree with Dr. Koval's management and documentation. 

## 2013-03-18 NOTE — Progress Notes (Signed)
Patient ID: Steven [name], male   DOB: 1961/02/17, 52 y.o.   MRN: [id number] Reviewed: Agree with Dr. Macky Lower management and documentation.

## 2013-03-18 NOTE — Assessment & Plan Note (Signed)
Patient has a long standing history of diabetes and is currently uncontrolled. Denies hypoglycemic events and is able to verbalize appropriate hypoglycemia management plan.  Reports adherence with medication. Control is suboptimal due to erratic diet, excessive drinking, and possible medication adherence. Started basal insulin Lantus (insulin glargine) 15 units every morning. Written patient instructions provided.  Follow up in Pharmacist Clinic Visit in one week. Total time in face to face counseling 40 minutes.  Patient seen with Valene Bors, PharmD Candidate,  Anthony Sar, PharmD Resident, and Georga Kaufmann, PharmD Resident.

## 2013-03-20 ENCOUNTER — Encounter: Payer: Self-pay | Admitting: Internal Medicine

## 2013-03-31 ENCOUNTER — Encounter: Payer: Self-pay | Admitting: Family Medicine

## 2013-03-31 ENCOUNTER — Ambulatory Visit (INDEPENDENT_AMBULATORY_CARE_PROVIDER_SITE_OTHER): Payer: Medicaid Other | Admitting: Family Medicine

## 2013-03-31 VITALS — BP 127/85 | HR 99 | Temp 99.1°F | Ht 76.0 in | Wt 226.0 lb

## 2013-03-31 DIAGNOSIS — R2689 Other abnormalities of gait and mobility: Secondary | ICD-10-CM

## 2013-03-31 DIAGNOSIS — L97509 Non-pressure chronic ulcer of other part of unspecified foot with unspecified severity: Secondary | ICD-10-CM

## 2013-03-31 DIAGNOSIS — E1165 Type 2 diabetes mellitus with hyperglycemia: Secondary | ICD-10-CM

## 2013-03-31 DIAGNOSIS — E1169 Type 2 diabetes mellitus with other specified complication: Secondary | ICD-10-CM

## 2013-03-31 DIAGNOSIS — E11621 Type 2 diabetes mellitus with foot ulcer: Secondary | ICD-10-CM

## 2013-03-31 DIAGNOSIS — R29818 Other symptoms and signs involving the nervous system: Secondary | ICD-10-CM

## 2013-03-31 MED ORDER — INSULIN GLARGINE 100 UNIT/ML SOLOSTAR PEN: 25.0000 [IU] | PEN_INJECTOR | Freq: Every morning | SUBCUTANEOUS | Status: DC

## 2013-03-31 NOTE — Progress Notes (Signed)
Patient ID: Steven [name], male   DOB: Apr 03, 1961, 52 y.o.   MRN: [id number]   HPI:  Diabetes: Patient has been taking 20 units of insulin in the mornings. Checks his blood sugars at home, states that his lowest fasting in the morning has been 150, and his high CBG during the day has been in the 260s. He denies any chest pain or shortness of breath. He denies having any problems with his insulin, but does need a refill.  Foot wound: Recently had an MRI for a diabetic foot wound of his right foot, which showed no evidence of active osteomyelitis. He now endorses having a wound of his left foot overlying the fifth metatarsal. Has had prior ray amputations of the right foot done by Dr. Lajoyce Corners.  Motorized wheelchair assessment: Patient currently is in a motorized wheelchair. States he's had this for 6 years. It is the first one he has ever had. He uses it every time he leaves the house. He is not able to walk without it due to to swelling in his legs, has to stop and rest every few hours. He has never had a doctor say that he needed a wheelchair before, he got it from his aunt when she died. He states he needs a new chair because his battery has been going dead. His aunt had the chair for 5 years before he did. It has had multiple flat tires over the years. He uses a chair to get to the bathroom by riding into the bathroom. He does stand up to cook meals, propping his leg up on the side of the chair. States he cannot stand for long periods he sits down in order to put on his pants and shoes, then stands up to flat on his pants. He does live alone.  ROS: See HPI  PMFSH: hx uncontrolled diabetes, lives alone      PHYSICAL EXAM: BP 127/85  Pulse 99  Temp(Src) 99.1 F (37.3 C) (Oral)  Ht 6\' 4"  (1.93 m)  Wt 226 lb (102.513 kg)  BMI 27.52 kg/m2 Gen: No acute distress, mildly irritable affect when discussing medical necessity of motorized wheelchair HEENT: Normocephalic, atraumatic Heart: Regular rate and  rhythm, no murmurs Lungs: Clear to auscultation bilaterally, normal respiratory effort Extremities: Left foot has open draining ulcer that is approximately 1 cm in diameter, with purulent drainage, probes deeply. No surrounding erythema. Right lower extremity has ulcer overlying fifth metatarsal which is improved in appearance and does not probe deeply. Some drainage noted on sock from this ulcer. Neuro: Grossly nonfocal, speech intact, gait slightly unsteady.  ASSESSMENT/PLAN:  # Diabetic foot wound: Right foot ulcer appears improved. Recent MRI had no evidence of acute osteomyelitis. Left foot wound is new and probes deep enough that I am concerned for osteomyelitis. Will place referral to Dr. Lajoyce Corners so the patient may be seen there again. Precepted with Dr. Mauricio Po, who also examined patient and agrees with this plan.  # Motorized wheelchair evaluation: Will refer patient to physical therapy for evaluation to see if they agree that he needs a motorized wheelchair.  See problem based charting for additional assessment/plan.  FOLLOW UP: F/u in one month for further insulin titration, also followup with Dr. Lajoyce Corners for diabetic foot wound.  [place] J. Pollie Meyer, MD [facility]

## 2013-03-31 NOTE — Patient Instructions (Addendum)
We will set up an appointment with Dr. Lajoyce Corners for your left foot wound.  We will also set up an appointment with physical therapy for a motorized wheelchair evaluation.  Increase your insulin to 25 units in the morning. Keep checking your blood sugars several times per day, especially in the morning before you eat anything. If you get any numbers less than 80, call us. If you feel dizzy or lightheaded, check your blood sugar and call us right away. If it is less than 100 and you feel sick (dizzy/lightheaded) eat something sweet.  Follow up with me in 1 month to keep adjusting your insulin dose.  Be well, Dr. Pollie Meyer

## 2013-04-04 NOTE — Assessment & Plan Note (Signed)
Tolerating insulin 20 units daily. As patient reports lowest CBG is 150 when fasting in the morning, and has had some elevated sugars in the 260s, will increase by 5 units daily for a total of 25 units each morning. Followup in one month to further titrate insulin.

## 2013-04-18 ENCOUNTER — Other Ambulatory Visit (HOSPITAL_COMMUNITY): Payer: Self-pay | Admitting: Orthopedic Surgery

## 2013-04-22 ENCOUNTER — Other Ambulatory Visit (HOSPITAL_COMMUNITY): Payer: Self-pay | Admitting: *Deleted

## 2013-04-22 ENCOUNTER — Encounter (HOSPITAL_COMMUNITY): Payer: Self-pay | Admitting: Pharmacy Technician

## 2013-04-22 ENCOUNTER — Encounter (HOSPITAL_COMMUNITY)
Admission: RE | Admit: 2013-04-22 | Discharge: 2013-04-22 | Disposition: A | Discharge status: Home / Self Care | Payer: Medicaid Other | Source: Ambulatory Visit | Attending: Orthopedic Surgery | Admitting: Orthopedic Surgery

## 2013-04-22 ENCOUNTER — Encounter (HOSPITAL_COMMUNITY): Payer: Self-pay

## 2013-04-22 LAB — COMPREHENSIVE METABOLIC PANEL
Albumin: 3.4 g/dL — ABNORMAL LOW (ref 3.5–5.2)
Alkaline Phosphatase: 128 U/L — ABNORMAL HIGH (ref 39–117)
BUN: 14 mg/dL (ref 6–23)
CO2: 29 mEq/L (ref 19–32)
Potassium: 4.4 mEq/L (ref 3.5–5.1)
Sodium: 133 mEq/L — ABNORMAL LOW (ref 135–145)
Total Bilirubin: 0.2 mg/dL — ABNORMAL LOW (ref 0.3–1.2)
Total Protein: 8.3 g/dL (ref 6.0–8.3)

## 2013-04-22 LAB — CBC
HCT: 42.6 % (ref 39.0–52.0)
MCHC: 34.3 g/dL (ref 30.0–36.0)
Platelets: 308 10*3/uL (ref 150–400)
RDW: 13.6 % (ref 11.5–15.5)

## 2013-04-22 LAB — APTT: aPTT: 26 seconds (ref 24–37)

## 2013-04-22 LAB — PROTIME-INR: Prothrombin Time: 11.8 seconds (ref 11.6–15.2)

## 2013-04-22 MED ORDER — CEFAZOLIN SODIUM-DEXTROSE 2-3 GM-% IV SOLR
2.0000 g | INTRAVENOUS | Status: AC
  Administered 2013-04-23: 2 g via INTRAVENOUS
  Filled 2013-04-22: qty 50

## 2013-04-22 NOTE — Progress Notes (Signed)
Anesthesia Chart Review:  52 year old with hx of smoking, poorly controlled DM2, HTN, ETOH, PVD, DVT scheduled for left fifth toe ray amputation on 04/23/13 by Dr. Lajoyce Corners.  PAT appointment was earlier today. He had a right 2nd toe ray amp in 04/2011 and right 1st toe ray amp 04/2012. PCP is with [facility], last visit on 03/31/13 with Dr. Pollie Meyer.  She referred him to Dr. Lajoyce Corners due to concerns for osteomyelitis on his diabetic foot wound.  His insulin dose was increased at that time  EKG on 04/22/13 showed NSR, LVH, nonspecific lateral ST abnormality, septal infarct (age undetermined). Anteroseptal ST abnormality that was noted on prior EKG on 05/02/11 and felt most consistent with early repolarization as patient was asymptomatic.  The interpreting cardiologist agreed EKG appeared stable.  CXR on 04/24/12 showed no edema or consolidation.   Preoperative labs noted.  His glucose is significantly elevated at 378. I have called his glucose and EKG results to [name] at Dr. Audrie Lia office.  She is aware that if patient's glucose is significantly elevated on arrival then Dr. Lajoyce Corners will need to address the urgency of the procedure to determine if procedure should be canceled or delayed until sugars are better controlled.  As noted, his PCP did increase his insulin just a few weeks ago.    Velna Ochs [facility] Stay [facility]/Anesthesiology Phone [phone] 04/22/2013 2:40 PM

## 2013-04-22 NOTE — Pre-Procedure Instructions (Signed)
Steven [name]  04/22/2013   Your procedure is scheduled on:  Wednesday, April 23, 2013 at 10:35 AM.   Report to [facility] Entrance "A" at 8:30 AM.   Call this number if you have problems the morning of surgery: [phone]   Remember:   Do not eat food or drink liquids after midnight tonight, 04/22/13.   Take these medicines the morning of surgery with A SIP OF WATER: gabapentin (NEURONTIN), fluticasone (FLONASE)  Do not take any of your diabetic medicines in the AM.    Do not wear jewelry.  Do not wear lotions, powders, or cologne. You may wear deodorant.             Men may shave face and neck.  Do not bring valuables to the hospital.  [facility] is not responsible                  for any belongings or valuables.               Contacts, dentures or bridgework may not be worn into surgery.  Leave suitcase in the car. After surgery it may be brought to your room.  For patients admitted to the hospital, discharge time is determined by your                treatment team.               Patients discharged the day of surgery will not be allowed to drive  home.  Name and phone number of your driver: Family/friend   Special Instructions: Shower using CHG 2 nights before surgery and the night before surgery.  If you shower the day of surgery use CHG.  Use special wash - you have one bottle of CHG for all showers.  You should use approximately 1/3 of the bottle for each shower.   Please read over the following fact sheets that you were given: Pain Booklet, Coughing and Deep Breathing and Surgical Site Infection Prevention

## 2013-04-23 ENCOUNTER — Ambulatory Visit (HOSPITAL_COMMUNITY)
Admission: RE | Admit: 2013-04-23 | Discharge: 2013-04-23 | Disposition: A | Discharge status: Home / Self Care | Payer: Medicaid Other | Source: Ambulatory Visit | Attending: Orthopedic Surgery | Admitting: Orthopedic Surgery

## 2013-04-23 ENCOUNTER — Encounter (HOSPITAL_COMMUNITY): Payer: Medicaid Other | Admitting: Vascular Surgery

## 2013-04-23 ENCOUNTER — Encounter (HOSPITAL_COMMUNITY): Payer: Self-pay | Admitting: *Deleted

## 2013-04-23 ENCOUNTER — Ambulatory Visit (HOSPITAL_COMMUNITY): Payer: Medicaid Other | Admitting: Anesthesiology

## 2013-04-23 ENCOUNTER — Encounter (HOSPITAL_COMMUNITY)
Admission: RE | Disposition: A | Discharge status: Home / Self Care | Payer: Self-pay | Source: Ambulatory Visit | Attending: Orthopedic Surgery

## 2013-04-23 DIAGNOSIS — E1142 Type 2 diabetes mellitus with diabetic polyneuropathy: Secondary | ICD-10-CM | POA: Insufficient documentation

## 2013-04-23 DIAGNOSIS — Z86718 Personal history of other venous thrombosis and embolism: Secondary | ICD-10-CM | POA: Insufficient documentation

## 2013-04-23 DIAGNOSIS — M869 Osteomyelitis, unspecified: Secondary | ICD-10-CM | POA: Insufficient documentation

## 2013-04-23 DIAGNOSIS — E1159 Type 2 diabetes mellitus with other circulatory complications: Secondary | ICD-10-CM | POA: Insufficient documentation

## 2013-04-23 DIAGNOSIS — E1149 Type 2 diabetes mellitus with other diabetic neurological complication: Secondary | ICD-10-CM | POA: Insufficient documentation

## 2013-04-23 DIAGNOSIS — I798 Other disorders of arteries, arterioles and capillaries in diseases classified elsewhere: Secondary | ICD-10-CM | POA: Insufficient documentation

## 2013-04-23 DIAGNOSIS — L97509 Non-pressure chronic ulcer of other part of unspecified foot with unspecified severity: Secondary | ICD-10-CM | POA: Insufficient documentation

## 2013-04-23 DIAGNOSIS — I10 Essential (primary) hypertension: Secondary | ICD-10-CM | POA: Insufficient documentation

## 2013-04-23 DIAGNOSIS — E1165 Type 2 diabetes mellitus with hyperglycemia: Secondary | ICD-10-CM

## 2013-04-23 DIAGNOSIS — E1169 Type 2 diabetes mellitus with other specified complication: Secondary | ICD-10-CM | POA: Insufficient documentation

## 2013-04-23 DIAGNOSIS — M9080 Osteopathy in diseases classified elsewhere, unspecified site: Secondary | ICD-10-CM | POA: Insufficient documentation

## 2013-04-23 HISTORY — PX: AMPUTATION: SHX166

## 2013-04-23 LAB — GLUCOSE, CAPILLARY: Glucose-Capillary: 193 mg/dL — ABNORMAL HIGH (ref 70–99)

## 2013-04-23 SURGERY — AMPUTATION, FOOT, RAY
Anesthesia: General | Site: Foot | Laterality: Left | Wound class: Contaminated

## 2013-04-23 MED ORDER — INSULIN ASPART 100 UNIT/ML ~~LOC~~ SOLN
SUBCUTANEOUS | Status: AC
  Filled 2013-04-23: qty 1

## 2013-04-23 MED ORDER — FENTANYL CITRATE 0.05 MG/ML IJ SOLN
INTRAMUSCULAR | Status: DC | PRN
  Administered 2013-04-23: 100 ug via INTRAVENOUS
  Administered 2013-04-23 (×2): 50 ug via INTRAVENOUS

## 2013-04-23 MED ORDER — LACTATED RINGERS IV SOLN
INTRAVENOUS | Status: DC
  Administered 2013-04-23: 10:00:00 via INTRAVENOUS

## 2013-04-23 MED ORDER — INSULIN ASPART 100 UNIT/ML ~~LOC~~ SOLN
6.0000 [IU] | Freq: Once | SUBCUTANEOUS | Status: AC
  Administered 2013-04-23: 6 [IU] via SUBCUTANEOUS

## 2013-04-23 MED ORDER — LIDOCAINE HCL (CARDIAC) 20 MG/ML IV SOLN
INTRAVENOUS | Status: DC | PRN
  Administered 2013-04-23: 50 mg via INTRAVENOUS

## 2013-04-23 MED ORDER — OXYCODONE-ACETAMINOPHEN 5-325 MG PO TABS: 1.0000 | ORAL_TABLET | ORAL | Status: DC | PRN

## 2013-04-23 MED ORDER — PROPOFOL 10 MG/ML IV BOLUS
INTRAVENOUS | Status: DC | PRN
  Administered 2013-04-23: 180 mg via INTRAVENOUS

## 2013-04-23 MED ORDER — PHENYLEPHRINE HCL 10 MG/ML IJ SOLN
INTRAMUSCULAR | Status: DC | PRN
  Administered 2013-04-23: 80 ug via INTRAVENOUS

## 2013-04-23 MED ORDER — LACTATED RINGERS IV SOLN
INTRAVENOUS | Status: DC | PRN
  Administered 2013-04-23: 11:00:00 via INTRAVENOUS

## 2013-04-23 MED ORDER — MIDAZOLAM HCL 5 MG/5ML IJ SOLN
INTRAMUSCULAR | Status: DC | PRN
  Administered 2013-04-23: 2 mg via INTRAVENOUS

## 2013-04-23 SURGICAL SUPPLY — 41 items
BANDAGE ESMARK 6X9 LF (GAUZE/BANDAGES/DRESSINGS) IMPLANT
BANDAGE GAUZE ELAST BULKY 4 IN (GAUZE/BANDAGES/DRESSINGS) ×2 IMPLANT
BLADE SAW SGTL MED 73X18.5 STR (BLADE) IMPLANT
BNDG COHESIVE 4X5 TAN STRL (GAUZE/BANDAGES/DRESSINGS) ×2 IMPLANT
BNDG COHESIVE 6X5 TAN STRL LF (GAUZE/BANDAGES/DRESSINGS) ×2 IMPLANT
BNDG ESMARK 6X9 LF (GAUZE/BANDAGES/DRESSINGS)
CLOTH BEACON ORANGE TIMEOUT ST (SAFETY) ×2 IMPLANT
CUFF TOURNIQUET SINGLE 34IN LL (TOURNIQUET CUFF) IMPLANT
CUFF TOURNIQUET SINGLE 44IN (TOURNIQUET CUFF) IMPLANT
DRAPE U-SHAPE 47X51 STRL (DRAPES) ×4 IMPLANT
DRSG ADAPTIC 3X8 NADH LF (GAUZE/BANDAGES/DRESSINGS) ×2 IMPLANT
DRSG PAD ABDOMINAL 8X10 ST (GAUZE/BANDAGES/DRESSINGS) ×2 IMPLANT
DURAPREP 26ML APPLICATOR (WOUND CARE) ×2 IMPLANT
ELECT REM PT RETURN 9FT ADLT (ELECTROSURGICAL) ×2
ELECTRODE REM PT RTRN 9FT ADLT (ELECTROSURGICAL) ×1 IMPLANT
GLOVE BIOGEL PI IND STRL 9 (GLOVE) ×1 IMPLANT
GLOVE BIOGEL PI INDICATOR 9 (GLOVE) ×1
GLOVE SURG ORTHO 9.0 STRL STRW (GLOVE) ×2 IMPLANT
GOWN PREVENTION PLUS XLARGE (GOWN DISPOSABLE) ×2 IMPLANT
GOWN SRG XL XLNG 56XLVL 4 (GOWN DISPOSABLE) ×1 IMPLANT
GOWN STRL NON-REIN XL XLG LVL4 (GOWN DISPOSABLE) ×1
KIT BASIN OR (CUSTOM PROCEDURE TRAY) ×2 IMPLANT
KIT ROOM TURNOVER OR (KITS) ×2 IMPLANT
MANIFOLD NEPTUNE II (INSTRUMENTS) ×2 IMPLANT
NS IRRIG 1000ML POUR BTL (IV SOLUTION) ×2 IMPLANT
PACK ORTHO EXTREMITY (CUSTOM PROCEDURE TRAY) ×2 IMPLANT
PAD ARMBOARD 7.5X6 YLW CONV (MISCELLANEOUS) ×4 IMPLANT
PAD CAST 4YDX4 CTTN HI CHSV (CAST SUPPLIES) ×1 IMPLANT
PADDING CAST COTTON 4X4 STRL (CAST SUPPLIES) ×1
PADDING CAST COTTON 6X4 STRL (CAST SUPPLIES) ×2 IMPLANT
SPONGE GAUZE 4X4 12PLY (GAUZE/BANDAGES/DRESSINGS) ×2 IMPLANT
SPONGE LAP 18X18 X RAY DECT (DISPOSABLE) ×2 IMPLANT
STAPLER VISISTAT 35W (STAPLE) IMPLANT
STOCKINETTE IMPERVIOUS LG (DRAPES) IMPLANT
SUCTION FRAZIER TIP 10 FR DISP (SUCTIONS) IMPLANT
SUT ETHILON 2 0 PSLX (SUTURE) ×4 IMPLANT
TOWEL OR 17X24 6PK STRL BLUE (TOWEL DISPOSABLE) ×2 IMPLANT
TOWEL OR 17X26 10 PK STRL BLUE (TOWEL DISPOSABLE) ×2 IMPLANT
TUBE CONNECTING 12X1/4 (SUCTIONS) IMPLANT
UNDERPAD 30X30 INCONTINENT (UNDERPADS AND DIAPERS) ×2 IMPLANT
WATER STERILE IRR 1000ML POUR (IV SOLUTION) IMPLANT

## 2013-04-23 NOTE — Progress Notes (Signed)
Dr Jacklynn Bue called and informed of CBG results new orders noted. Insulin given as ordered.

## 2013-04-23 NOTE — H&P (Signed)
Steven [name] is an 52 y.o. male.   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 Patient is failed prolonged conservative care.  Past Medical History  Diagnosis Date  . DM (diabetes mellitus)   . HTN (hypertension)   . History of DVT (deep vein thrombosis)   . Personal history of diabetic foot ulcer   . Peripheral vascular disease   . Peripheral neuropathy 11/19/2011  . Personal history of diabetic foot ulcer   . Insomnia   . Sinus congestion   . Varicose vein     legs  . Osteomyelitis of foot, right, acute 08/30/2012    Past Surgical History  Procedure Laterality Date  . Amputation  04/21/2011    Procedure: AMPUTATION RAY;  Surgeon: Nadara Mustard, MD;  Location: [facility] OR;  Service: Orthopedics;  Laterality: Right;  Rt foot 2nd ray ampt  . I&d extremity  05/03/2011    Procedure: IRRIGATION AND DEBRIDEMENT EXTREMITY;  Surgeon: Nadara Mustard, MD;  Location: [facility] OR;  Service: Orthopedics;  Laterality: Right;  Irrigation and Debridement Right Foot and Place Acell Xenograft  . Toe amputation  04/24/2012    great toe   right foot  . Amputation  04/24/2012    Procedure: AMPUTATION RAY;  Surgeon: Nadara Mustard, MD;  Location: [facility] OR;  Service: Orthopedics;  Laterality: Right;  Right foot 1st ray amputation    Family History  Problem Relation Age of Onset  . Diabetes Mother   . Stroke Brother    Social History:  reports that he has been smoking Cigarettes.  He has a 15 pack-year smoking history. He has never used smokeless tobacco. He reports that he drinks about 2.5 ounces of alcohol per week. He reports that he does not use illicit drugs.  Allergies: No Known Allergies  No prescriptions prior to admission    Results for orders placed during the hospital encounter of 04/22/13 (from the past 48 hour(s))  APTT      Status: None   Collection Time    04/22/13  8:46 AM      Result Value Range   aPTT 26  24 - 37 seconds  CBC     Status: None   Collection Time    04/22/13  8:46 AM      Result Value Range   WBC 6.0  4.0 - 10.5 K/uL   RBC 4.83  4.22 - 5.81 MIL/uL   Hemoglobin 14.6  13.0 - 17.0 g/dL   HCT 40.9  81.1 - 91.4 %   MCV 88.2  78.0 - 100.0 fL   MCH 30.2  26.0 - 34.0 pg   MCHC 34.3  30.0 - 36.0 g/dL   RDW 78.2  95.6 - 21.3 %   Platelets 308  150 - 400 K/uL  COMPREHENSIVE METABOLIC PANEL     Status: Abnormal   Collection Time    04/22/13  8:46 AM      Result Value Range   Sodium 133 (*) 135 - 145 mEq/L   Potassium 4.4  3.5 - 5.1 mEq/L   Chloride 94 (*) 96 - 112 mEq/L   CO2 29  19 - 32 mEq/L   Glucose, Bld 378 (*) 70 - 99 mg/dL   BUN 14  6 - 23 mg/dL   Creatinine, Ser 0.86  0.50 - 1.35 mg/dL   Calcium  10.3  8.4 - 10.5 mg/dL   Total Protein 8.3  6.0 - 8.3 g/dL   Albumin 3.4 (*) 3.5 - 5.2 g/dL   AST 27  0 - 37 U/L   ALT 33  0 - 53 U/L   Alkaline Phosphatase 128 (*) 39 - 117 U/L   Total Bilirubin 0.2 (*) 0.3 - 1.2 mg/dL   GFR calc non Af Amer >90  >90 mL/min   GFR calc Af Amer >90  >90 mL/min   Comment: (NOTE)     The eGFR has been calculated using the CKD EPI equation.     This calculation has not been validated in all clinical situations.     eGFR's persistently <90 mL/min signify possible Chronic Kidney     Disease.  PROTIME-INR     Status: None   Collection Time    04/22/13  8:46 AM      Result Value Range   Prothrombin Time 11.8  11.6 - 15.2 seconds   INR 0.88  0.00 - 1.49   No results found.  Review of Systems  All other systems reviewed and are negative.    There were no vitals taken for this visit. Physical Exam  On examination patient has palpable pulses. He has ulceration and osteomyelitis of the fifth metatarsal head left foot Assessment/Plan Assessment: Osteomyelitis left foot fifth metatarsal head.  Plan: Will plan for left foot fifth ray amputation. Risks  and benefits were discussed including infection neurovascular injury pain DVT need for additional surgery. Patient states he understands and wished to proceed at this time.  Monte Bronder V 04/23/2013, 6:24 AM

## 2013-04-23 NOTE — Op Note (Signed)
OPERATIVE REPORT  DATE OF SURGERY: 04/23/2013  PATIENT:  Steven [name],  52 y.o. male  PRE-OPERATIVE DIAGNOSIS:  Osteomyelitis Left Foot 5th MT  POST-OPERATIVE DIAGNOSIS:  Osteomyelitis Left Foot 5th MT  PROCEDURE:  Procedure(s): AMPUTATION RAY left foot fifth ray  SURGEON:  Surgeon(s): Nadara Mustard, MD  ANESTHESIA:   general  EBL:  Minimal ML  SPECIMEN:  Source of Specimen:  Left foot fifth toe  TOURNIQUET:  * No tourniquets in log *  PROCEDURE DETAILS: Patient is a 52 year old gentleman diabetic insensate neuropathy with chronic ulcer beneath the fifth metatarsal head patient is failed conservative wound care has osteomyelitis and presents at this time for left foot fifth ray amputation. Risks and benefits were discussed including persistent infection nonhealing of the wound need for additional surgery. Patient states he understands and wished to proceed at this time. Description of procedure patient brought to or room and underwent a general anesthetic. After adequate levels of anesthesia were obtained patient's left lower extremity was prepped using DuraPrep and draped into a sterile field. A racquet incision was made around the toe and the ulcer. The metatarsal was transected near midshaft. The wounds irrigated normal saline the stasis was obtained the incision was closed using 2-0 nylon. The wound was covered with Adaptic orthopedic sponges AB dressing Kerlix and Coban. Patient was extubated taken to the PACU in stable condition plan for discharge to home.  PLAN OF CARE: Discharge to home after PACU  PATIENT DISPOSITION:  PACU - hemodynamically stable.   Nadara Mustard, MD 04/23/2013 11:03 AM

## 2013-04-23 NOTE — Anesthesia Preprocedure Evaluation (Signed)
Anesthesia Evaluation  Patient identified by MRN, date of birth, ID band Patient awake    Reviewed: Allergy & Precautions, H&P , NPO status , Patient's Chart, lab work & pertinent test results  Airway Mallampati: II TM Distance: >3 FB Neck ROM: full    Dental no notable dental hx. (+) Edentulous Upper and Edentulous Lower   Pulmonary neg pulmonary ROS, Current Smoker,  breath sounds clear to auscultation  Pulmonary exam normal       Cardiovascular hypertension, On Medications + Peripheral Vascular Disease negative cardio ROS  Rhythm:regular Rate:Normal     Neuro/Psych  Neuromuscular disease negative neurological ROS  negative psych ROS   GI/Hepatic negative GI ROS, Neg liver ROS,   Endo/Other  negative endocrine ROSdiabetes  Renal/GU      Musculoskeletal   Abdominal   Peds  Hematology negative hematology ROS (+) Blood dyscrasia, ,   Anesthesia Other Findings   Reproductive/Obstetrics negative OB ROS                           Anesthesia Physical Anesthesia Plan  ASA: III  Anesthesia Plan: General   Post-op Pain Management:    Induction:   Airway Management Planned: LMA  Additional Equipment:   Intra-op Plan:   Post-operative Plan: Extubation in OR  Informed Consent: I have reviewed the patients History and Physical, chart, labs and discussed the procedure including the risks, benefits and alternatives for the proposed anesthesia with the patient or authorized representative who has indicated his/her understanding and acceptance.     Plan Discussed with: CRNA and Surgeon  Anesthesia Plan Comments:         Anesthesia Quick Evaluation

## 2013-04-23 NOTE — Progress Notes (Signed)
Orthopedic Tech Progress Note Patient Details:  SACRAMENTO MONDS 02-16-1961 [id number]  Ortho Devices Type of Ortho Device: Postop shoe/boot Ortho Device/Splint Interventions: Application   Shawnie Pons 04/23/2013, 12:02 PM

## 2013-04-23 NOTE — Transfer of Care (Signed)
Immediate Anesthesia Transfer of Care Note  Patient: Steven [name]  Procedure(s) Performed: Procedure(s) with comments: AMPUTATION RAY (Left) - Left Foot 5th Ray Amputation  Patient Location: PACU  Anesthesia Type:General  Level of Consciousness: sedated  Airway & Oxygen Therapy: Patient Spontanous Breathing and Patient connected to face mask oxygen  Post-op Assessment: Report given to PACU RN and Post -op Vital signs reviewed and stable  Post vital signs: Reviewed and stable  Complications: No apparent anesthesia complications

## 2013-04-23 NOTE — Anesthesia Postprocedure Evaluation (Signed)
  Anesthesia Post-op Note  Patient: Steven [name]  Procedure(s) Performed: Procedure(s) with comments: AMPUTATION RAY (Left) - Left Foot 5th Ray Amputation  Patient Location: PACU  Anesthesia Type:General  Level of Consciousness: awake  Airway and Oxygen Therapy: Patient Spontanous Breathing  Post-op Pain: none  Post-op Assessment: Post-op Vital signs reviewed  Post-op Vital Signs: stable  Complications: No apparent anesthesia complications

## 2013-04-28 ENCOUNTER — Encounter (HOSPITAL_COMMUNITY): Payer: Self-pay | Admitting: Orthopedic Surgery

## 2013-04-28 ENCOUNTER — Telehealth: Payer: Self-pay | Admitting: Family Medicine

## 2013-04-28 NOTE — Telephone Encounter (Signed)
Vendor called to inquire about papers sent with patient for motarized wheelchair evaluation.  Papers sent in September.  Patient was seen by provider in Oct, and referred to PT.  Need to have provider send in documents asap before time to file expire in 2 weeks.  Need also to document PT's evaluation of patient as well.  Can call [employer] at [phone] and give pt's ID # [id number].

## 2013-04-29 NOTE — Telephone Encounter (Signed)
Will fwd. To PCP for review .Steven [name]  

## 2013-04-30 NOTE — Telephone Encounter (Signed)
Has patient been seen by physical therapy? I have not received a report of them having seen him, nor am I able to see it in the computer. Please call to ask this.  Latrelle Dodrill, MD

## 2013-05-01 ENCOUNTER — Encounter: Payer: Self-pay | Admitting: Family Medicine

## 2013-05-01 ENCOUNTER — Ambulatory Visit (INDEPENDENT_AMBULATORY_CARE_PROVIDER_SITE_OTHER): Payer: Medicaid Other | Admitting: Family Medicine

## 2013-05-01 VITALS — BP 137/75 | HR 90 | Temp 98.2°F | Ht 76.0 in | Wt 233.9 lb

## 2013-05-01 DIAGNOSIS — E1165 Type 2 diabetes mellitus with hyperglycemia: Secondary | ICD-10-CM

## 2013-05-01 MED ORDER — CETIRIZINE HCL 10 MG PO TABS: 10.0000 mg | ORAL_TABLET | Freq: Every day | ORAL | Status: DC

## 2013-05-01 MED ORDER — ALBUTEROL SULFATE HFA 108 (90 BASE) MCG/ACT IN AERS: 2.0000 | INHALATION_SPRAY | Freq: Four times a day (QID) | RESPIRATORY_TRACT | Status: DC | PRN

## 2013-05-01 MED ORDER — FLUTICASONE PROPIONATE 50 MCG/ACT NA SUSP: 2.0000 | Freq: Every day | NASAL | Status: DC

## 2013-05-01 NOTE — Patient Instructions (Signed)
It was great to see you again today!  Check your blood sugar every day and write down the numbers, especially in the morning. Follow up with me in 2 weeks at which point we'll work on your insulin.  I will get in touch with our referral person about physical therapy.  I sent in an inhaler and some allergy medicine for you to try. Come back in 2 weeks to recheck how you are doing.  Be well, Dr. Pollie Meyer

## 2013-05-01 NOTE — Progress Notes (Signed)
Patient ID: Steven [name], male   DOB: 23-Mar-1961, 52 y.o.   MRN: [id number]  HPI:  Diabetes: Patient is currently taking insulin 25 units daily. He does not check his blood sugars regularly, is only checking them twice per week. The lowest CBG he has gotten is 125.   Cough/congestion: reports having had a cough at night and sinus congestion x 2 months. He feels short of breath with lying down. He does notice himself wheezing. He is producing green/clear whitish sputum. Denies having any fevers. He is eating & drinking okay. Has not been around sick contacts. No chest pain. Occasionally has coughed during the day. No hemoptysis.   Motorized Wheelchair: requests again for me to fill out his form for a new electric wheelchair. He has not yet seen physical therapy. He has not been called to get this scheduled.  ROS: See HPI  PMFSH: smokes 1/2 ppd x at least 8 years Recently underwent ray amputation of one of his toes due to osteomyelitis, currently completing a course of doxycycline 100mg  BID x 1 month.  PHYSICAL EXAM: BP 137/75  Pulse 90  Temp(Src) 98.2 F (36.8 C) (Oral)  Ht 6\' 4"  (1.93 m)  Wt 233 lb 14.4 oz (106.096 kg)  BMI 28.48 kg/m2  SpO2 96% Gen: NAD, rides in electric wheelchair HEENT: TM's clear bilaterally, NCAT, MMM, no exudates Heart: RRR Lungs: CTAB, NWOB, no crackles or wheezes Neuro: grossly nonfocal, speech intact  ASSESSMENT/PLAN:  # Cough/congestion: possibly related to allergies, has been on flonase in the past but reports he needs a refill. Will rx zyrtec, flonase, and albuterol inhaler for prn use. F/u in 2 weeks to monitor for improvement. If no improvement at that time, will need to consider other etiologies for chronic cough with sputum production - could certainly have COPD given significant smoking hx and I don't see that he's ever had PFT's done. No signs of volume overload to suggest decompensated CHF (and pt does not carry dx of CHF). Could consider echo if  other workup unrevealing in the future.  # Request for wheelchair: still needs to be evaluated by physical therapy. He was not seen by PT when he was hospitalized recently for his amputation. I have told him I will look into why he has not yet been called by talking with our referral coordinator tomorrow, 11/21.   See problem based charting for additional assessment/plan.  FOLLOW UP: F/u in 2 weeks for improvement in cough/congestion & titration of Lantus. F/u with PT for evaluation for wheelchair.  [place] J. Pollie Meyer, MD [facility]

## 2013-05-06 NOTE — Assessment & Plan Note (Addendum)
Pt is not checking CBG's regularly. I am thus still unable to titrate his Lantus safely. I have stressed the importance of checking his CBG's at least every morning while fasting. He will f/u in 2 weeks with his CBG numbers at which point we will likely need to increase his Lantus.

## 2013-05-16 ENCOUNTER — Ambulatory Visit (AMBULATORY_SURGERY_CENTER): Payer: Self-pay

## 2013-05-16 VITALS — Ht 76.0 in | Wt 230.0 lb

## 2013-05-16 DIAGNOSIS — Z1211 Encounter for screening for malignant neoplasm of colon: Secondary | ICD-10-CM

## 2013-05-16 MED ORDER — SUPREP BOWEL PREP KIT 17.5-3.13-1.6 GM/177ML PO SOLN: 1.0000 | Freq: Once | ORAL | Status: DC

## 2013-05-19 ENCOUNTER — Ambulatory Visit: Payer: Medicaid Other | Admitting: Family Medicine

## 2013-05-29 ENCOUNTER — Encounter: Payer: Self-pay | Admitting: Internal Medicine

## 2013-05-29 ENCOUNTER — Ambulatory Visit (AMBULATORY_SURGERY_CENTER): Payer: Medicaid Other | Admitting: Internal Medicine

## 2013-05-29 VITALS — BP 122/75 | HR 71 | Temp 97.6°F | Resp 29 | Ht 76.0 in | Wt 230.0 lb

## 2013-05-29 DIAGNOSIS — Z860100 Personal history of colon polyps, unspecified: Secondary | ICD-10-CM

## 2013-05-29 DIAGNOSIS — Z1211 Encounter for screening for malignant neoplasm of colon: Secondary | ICD-10-CM

## 2013-05-29 DIAGNOSIS — D126 Benign neoplasm of colon, unspecified: Secondary | ICD-10-CM

## 2013-05-29 HISTORY — DX: Personal history of colon polyps, unspecified: Z86.0100

## 2013-05-29 HISTORY — DX: Personal history of colonic polyps: Z86.010

## 2013-05-29 LAB — GLUCOSE, CAPILLARY
Glucose-Capillary: 261 mg/dL — ABNORMAL HIGH (ref 70–99)
Glucose-Capillary: 239 mg/dL — ABNORMAL HIGH (ref 70–99)

## 2013-05-29 MED ORDER — ASPIRIN 81 MG PO TABS: 81.0000 mg | ORAL_TABLET | Freq: Every day | ORAL | Status: DC

## 2013-05-29 MED ORDER — SODIUM CHLORIDE 0.9 % IV SOLN: 500.0000 mL | INTRAVENOUS | Status: DC

## 2013-05-29 NOTE — Progress Notes (Signed)
Called to room to assist during endoscopic procedure.  Patient ID and intended procedure confirmed with present staff. Received instructions for my participation in the procedure from the performing physician.  

## 2013-05-29 NOTE — Op Note (Signed)
 [facility] [address] [place], [address]   COLONOSCOPY PROCEDURE REPORT  PATIENT: Steven [name], Steven [name]  MR#: [id number] BIRTHDATE: 1961/02/01 , 52  yrs. old GENDER: Male ENDOSCOPIST: Iva Boop, MD, [facility] REFERRED BY:   Levert Feinstein, MD PROCEDURE DATE:  05/29/2013 PROCEDURE:   Colonoscopy with biopsy and snare polypectomy First Screening Colonoscopy - Avg.  risk and is 50 yrs.  old or older Yes.  Prior Negative Screening - Now for repeat screening. N/A  History of Adenoma - Now for follow-up colonoscopy & has been > or = to 3 yrs.  N/A  Polyps Removed Today? Yes. ASA CLASS:   Class III INDICATIONS:average risk screening. MEDICATIONS: Propofol (Diprivan) 600 mg IV, MAC sedation, administered by CRNA, and These medications were titrated to patient response per physician's verbal order  DESCRIPTION OF PROCEDURE:   After the risks benefits and alternatives of the procedure were thoroughly explained, informed consent was obtained.  A digital rectal exam revealed no abnormalities of the rectum, A digital rectal exam revealed no prostatic nodules, and A digital rectal exam revealed the prostate was not enlarged.   The [id number]  endoscope was introduced through the anus and advanced to the cecum, which was identified by both the appendix and ileocecal valve. No adverse events experienced.   The quality of the prep was Suprep adequate The instrument was then slowly withdrawn as the colon was fully examined.      COLON FINDINGS: Ten sessile polyps measuring 2-60m in size were found at the cecum, in the ascending colon, at the hepatic flexure, in the transverse colon, and sigmoid colon.  A polypectomy was performed with cold forceps, with a cold snare and using snare cautery.  Separate techniques each polyp. The resection was complete and the polyp tissue was completely retrieved.   The colon mucosa was otherwise normal.  Retroflexed views  revealed no abnormalities. The time to cecum=3 minutes 26 seconds.  Withdrawal time=23 minutes 09 seconds.  The scope was withdrawn and the procedure completed. COMPLICATIONS: There were no complications.  ENDOSCOPIC IMPRESSION: 1.   Ten sessile polyps measuring 2-60m in size were found at the cecum, in the ascending colon, at the hepatic flexure, in the transverse colon, and sigmoid colon; polypectomy was performed with cold forceps, with a cold snare and using snare cautery 2.   The colon mucosa was otherwise normal - adequate prep - first colonoscopy  RECOMMENDATIONS: Timing of repeat colonoscopy will be determined by pathology findings.   eSigned:  Iva Boop, MD, [facility] 05/29/2013 8:51 AM   cc: The Patient  and Levert Feinstein, MD   PATIENT NAME:  Steven [name], Steven [name] MR#: [id number]

## 2013-05-29 NOTE — Progress Notes (Signed)
Dr. Leone Payor advised that pt. Blood sugar 239 post procedure.  No orders given.

## 2013-05-29 NOTE — Patient Instructions (Addendum)
I found and removed 10 polyps today. They all look benign but I suspect you will need a repeat colonoscopy in 1 year to follow-up.  I will let you know pathology results and when to have another routine colonoscopy by mail.  I appreciate the opportunity to care for you. Iva Boop, MD, FACG  YOU HAD AN ENDOSCOPIC PROCEDURE TODAY AT THE [facility]: Refer to the procedure report that was given to you for any specific questions about what was found during the examination.  If the procedure report does not answer your questions, please call your gastroenterologist to clarify.  If you requested that your care partner not be given the details of your procedure findings, then the procedure report has been included in a sealed envelope for you to review at your convenience later.  YOU SHOULD EXPECT: Some feelings of bloating in the abdomen. Passage of more gas than usual.  Walking can help get rid of the air that was put into your GI tract during the procedure and reduce the bloating. If you had a lower endoscopy (such as a colonoscopy or flexible sigmoidoscopy) you may notice spotting of blood in your stool or on the toilet paper. If you underwent a bowel prep for your procedure, then you may not have a normal bowel movement for a few days.  DIET: Your first meal following the procedure should be a light meal and then it is ok to progress to your normal diet.  A half-sandwich or bowl of soup is an example of a good first meal.  Heavy or fried foods are harder to digest and may make you feel nauseous or bloated.  Likewise meals heavy in dairy and vegetables can cause extra gas to form and this can also increase the bloating.  Drink plenty of fluids but you should avoid alcoholic beverages for 24 hours.  ACTIVITY: Your care partner should take you home directly after the procedure.  You should plan to take it easy, moving slowly for the rest of the day.  You can resume normal activity the day  after the procedure however you should NOT DRIVE or use heavy machinery for 24 hours (because of the sedation medicines used during the test).    SYMPTOMS TO REPORT IMMEDIATELY: A gastroenterologist can be reached at any hour.  During normal business hours, 8:30 AM to 5:00 PM Monday through Friday, call [phone].  After hours and on weekends, please call the GI answering service at [phone] who will take a message and have the physician on call contact you.   Following lower endoscopy (colonoscopy or flexible sigmoidoscopy):  Excessive amounts of blood in the stool  Significant tenderness or worsening of abdominal pains  Swelling of the abdomen that is new, acute  Fever of 100F or higher  Following upper endoscopy (EGD)  Vomiting of blood or coffee ground material  New chest pain or pain under the shoulder blades  Painful or persistently difficult swallowing  New shortness of breath  Fever of 100F or higher  Black, tarry-looking stools  FOLLOW UP: If any biopsies were taken you will be contacted by phone or by letter within the next 1-3 weeks.  Call your gastroenterologist if you have not heard about the biopsies in 3 weeks.  Our staff will call the home number listed on your records the next business day following your procedure to check on you and address any questions or concerns that you may have at that time regarding  the information given to you following your procedure. This is a courtesy call and so if there is no answer at the home number and we have not heard from you through the emergency physician on call, we will assume that you have returned to your regular daily activities without incident.  SIGNATURES/CONFIDENTIALITY: You and/or your care partner have signed paperwork which will be entered into your electronic medical record.  These signatures attest to the fact that that the information above on your After Visit Summary has been reviewed and is understood.   Full responsibility of the confidentiality of this discharge information lies with you and/or your care-partner.   Polyp information given.  Dr. Raelyn Ensign will advise you about next colonoscopy based on your pathology reports.  Hold aspirin, aspirin products and non steroidal medications for 2 weeks.

## 2013-05-29 NOTE — Progress Notes (Signed)
Lidocaine-40mg IV prior to Propofol InductionPropofol given over incremental dosages 

## 2013-05-30 ENCOUNTER — Telehealth: Payer: Self-pay

## 2013-05-30 NOTE — Telephone Encounter (Signed)
  Follow up Call-  Call back number 05/29/2013  Post procedure Call Back phone  # [phone]  Permission to leave phone message Yes     Patient questions:  Do you have a fever, pain , or abdominal swelling? no Pain Score  0 *  Have you tolerated food without any problems? yes  Have you been able to return to your normal activities? yes  Do you have any questions about your discharge instructions: Diet   no Medications  no Follow up visit  no  Do you have questions or concerns about your Care? no  Actions: * If pain score is 4 or above: No action needed, pain <4.

## 2013-06-08 ENCOUNTER — Encounter: Payer: Self-pay | Admitting: Internal Medicine

## 2013-06-08 NOTE — Progress Notes (Signed)
Quick Note:  9 tubular adenomas max 20 mm and 1 tubulovillous adenoma Repeat colonoscopy 05/2014 ______

## 2013-06-30 ENCOUNTER — Telehealth: Payer: Self-pay | Admitting: Family Medicine

## 2013-06-30 NOTE — Telephone Encounter (Signed)
Contact ID # [id number] Tito Dine called from [facility] asking if the paperwork was complete. Please advise

## 2013-07-01 ENCOUNTER — Ambulatory Visit (INDEPENDENT_AMBULATORY_CARE_PROVIDER_SITE_OTHER): Payer: Medicaid Other | Admitting: Family Medicine

## 2013-07-01 ENCOUNTER — Encounter: Payer: Self-pay | Admitting: Family Medicine

## 2013-07-01 VITALS — BP 121/82 | HR 90 | Temp 97.2°F | Resp 16 | Wt 229.0 lb

## 2013-07-01 DIAGNOSIS — J310 Chronic rhinitis: Secondary | ICD-10-CM

## 2013-07-01 DIAGNOSIS — B9789 Other viral agents as the cause of diseases classified elsewhere: Secondary | ICD-10-CM

## 2013-07-01 DIAGNOSIS — T485X5A Adverse effect of other anti-common-cold drugs, initial encounter: Principal | ICD-10-CM

## 2013-07-01 DIAGNOSIS — J069 Acute upper respiratory infection, unspecified: Secondary | ICD-10-CM

## 2013-07-01 MED ORDER — AEROCHAMBER PLUS MISC: Status: DC

## 2013-07-01 NOTE — Telephone Encounter (Signed)
[place], Are you able to check on the status of this pt's referral to physical therapy? I don't see that he's been seen by them yet and I referred him several months ago. Thanks, [place]

## 2013-07-01 NOTE — Patient Instructions (Signed)
Dear Steven [name], Thank you for coming in to clinic today. It was good to meet you!  Today we discussed your Nasal Congestion. 1. It seems that your nose is congested, but I do not see any evidence of a sinus infection requiring antibiotics. 2. I believe that your congestion is most likely caused by overuse of the Oxymetazoline nose spray (Nasal Relief), if you use this more than 5 days then it can cause congestion! Please STOP using this nose spray at least for the next 1-2 weeks. 3. Continue your Fluticasone (steroid nasal spray) 2 sprays in each nostril in the morning and at night for 1 week, and then you can return to 2 sprays daily. 4. Continue your other medications at this time. 5. If your congestion worsens, or you develop fever/chills, HA, worsening cough, shortness of breath or breathing difficulties, please call and come back sooner. 6. Also, I have sent a prescription for a spacer tube for your inhaler.  Some important numbers from today's visit: BP - 121/82   Please schedule a follow-up appointment with Dr. [name] within 1-2 weeks if your symptoms do not improve or worsen.  If you have any other questions or concerns, please feel free to call the clinic to contact me. You may also schedule an earlier appointment if necessary.  However, if your symptoms get significantly worse, please go to the Emergency Department to seek immediate medical attention.  [name], [place]

## 2013-07-01 NOTE — Telephone Encounter (Signed)
Will fwd. To PCP for review .Wannetta Langland  

## 2013-07-01 NOTE — Progress Notes (Signed)
Subjective:     Patient ID: Steven [name], male   DOB: 1960-12-31, 53 y.o.   MRN: [id number]  Patient presented for a same day visit.  HPI  URI SYMPTOMS: Reports 2 weeks of persistent nasal congestion. Initially began with runny nose, congestion, and cough. Since runny nose has improved, but mostly clear congestion remains with worsening productive cough at nighttime. - Brought all medicines. Taking Oxymetazoline (OTC) 1-2 sprays daily x 2 weeks, Fluticasone nasal spray x 1 each nare, Albuterol MDI nightly (not during day) without significant relief - Demonstrated Albuterol technique, poor seal with mouth decreased delivery of medicine  PMH: Significant for DM2 (poorly controlled, last HgbA1c 11.4), HTN, HLD. No prior dx COPD  Social Hx: Current smoker 0.5ppd, x 30 yrs  Immunizations - Has received influenza this season  Review of Systems  See above HPI, additionally: Denies fever/chills, prior recent illness, HA, sinus/facial pressure/pain, tooth pain, CP, dyspnea, abdominal pain, nausea / vomiting, diarrhea, weakness, numbness, or tingling.     Objective:   Physical Exam  There were no vitals taken for this visit.  Gen - chronically ill appearing but well, sitting in motorized wheel-chair, NAD HEENT - NCAT, PERRL, EOMI, b/l nares with congestion, red swollen turbinates, b/l TM's normal, oropharynx clear, MMM Neck - supple, non-tender, no LAD Heart - RRR, no murmurs heard Lungs - CTAB, no wheezing, crackles, or rhonchi. Normal work of breathing. Abd - soft, NTND, no masses, +active BS Ext - non-tender, no edema, peripheral pulses intact +2 b/l Skin - warm, dry, no rashes Neuro - awake, alert, oriented, grossly non-focal     Assessment:     See specific A&P problem list for details.      Plan:     See specific A&P problem list for details.

## 2013-07-02 DIAGNOSIS — J310 Chronic rhinitis: Secondary | ICD-10-CM | POA: Insufficient documentation

## 2013-07-02 DIAGNOSIS — T485X5A Adverse effect of other anti-common-cold drugs, initial encounter: Principal | ICD-10-CM

## 2013-07-02 NOTE — Telephone Encounter (Signed)
Received message that pt has been scheduled for wheelchair evaluation. Will await this eval before completing any forms for [employer].

## 2013-07-02 NOTE — Assessment & Plan Note (Addendum)
Clinically suggestive of persistent viral URI etiology, however given significant history of Oxymetazoline use >1 daily x 2 weeks concern for likely topical decongestant overuse. - No clinical concerns for sinus infection (afebrile, no facial pain/pressure, no HA, no purulent drainage).  Plan: 1. DC Oxymetazoline x 1-2 weeks, advised use in future only 3-5 days max 2. Increase Fluticasone to 2 sprays each nare BID x 1 week, then down to 2 sprays daily 3. Advised may use OTC Nasal Saline spray 4. Additionally, ordered spacer for Albuterol given poor technique, advised on appropriate technique 4. RTC 1-2 weeks if not improving/worsening, or develops fever

## 2013-07-24 ENCOUNTER — Ambulatory Visit: Payer: Medicaid Other | Attending: Family Medicine | Admitting: Physical Therapy

## 2013-07-24 DIAGNOSIS — M546 Pain in thoracic spine: Secondary | ICD-10-CM | POA: Insufficient documentation

## 2013-07-24 DIAGNOSIS — M25519 Pain in unspecified shoulder: Secondary | ICD-10-CM | POA: Insufficient documentation

## 2013-07-24 DIAGNOSIS — M542 Cervicalgia: Secondary | ICD-10-CM | POA: Insufficient documentation

## 2013-07-24 DIAGNOSIS — IMO0001 Reserved for inherently not codable concepts without codable children: Secondary | ICD-10-CM | POA: Insufficient documentation

## 2013-07-24 DIAGNOSIS — R293 Abnormal posture: Secondary | ICD-10-CM | POA: Insufficient documentation

## 2013-08-08 ENCOUNTER — Other Ambulatory Visit: Payer: Self-pay | Admitting: *Deleted

## 2013-08-08 MED ORDER — ASPIRIN 81 MG PO TABS: 81.0000 mg | ORAL_TABLET | Freq: Every day | ORAL | Status: DC

## 2013-08-11 ENCOUNTER — Telehealth: Payer: Self-pay | Admitting: Family Medicine

## 2013-08-11 NOTE — Telephone Encounter (Addendum)
Dr. [name] please call [employer] to discuss disposition of form they needed for Mr. Pantoja wheelchair eval he had at the [facility] on 2/12.  Were they left with you to complete and fax back or did they go to the [facility]?  Please advise vendor. Please use ref # [id number] when calling about Mr. Nick.

## 2013-08-13 NOTE — Telephone Encounter (Signed)
[place] red team, can you call outpatient rehab and have them fax over a copy of pt's wheelchair evaluation? I do not have a copy of this as the eval was sent over to our office and signed by another physician (who was covering my inbox while I was away). The evaluation was faxed back to outpatient rehab so I do not have access to a copy of it.  I am unable to answer [employer]'s question about where his form ended up. I have not seen this patient in the clinic in several months, nor do I know what form they are referring to.   Leeanne Rio, MD

## 2013-08-14 NOTE — Telephone Encounter (Signed)
Barbara with [employer] called for an update on the evaluation

## 2013-08-15 NOTE — Telephone Encounter (Signed)
Called outpatient Rehab [phone].  They are closed on Friday at 2pm.  Will attempt on Monday.  Steven [name], Steven [name], [place]

## 2013-08-19 NOTE — Telephone Encounter (Signed)
Spoke with Steven [name] at [facility], Neuro Rehab, [phone].  He will fax evaluation and fax today.  Steven [name], Steven [name], [place]

## 2013-08-19 NOTE — Telephone Encounter (Signed)
Evaluation results faxed and placed in MD box for review.  Steven [name], Steven [name], Steven [name]

## 2013-08-20 NOTE — Telephone Encounter (Signed)
Wheelchair evaluation reviewed. Attempted to call [facility], but they are not open this evening. I will try again at a later time.  Leeanne Rio, MD

## 2013-08-21 ENCOUNTER — Ambulatory Visit: Payer: Medicaid Other | Admitting: Family Medicine

## 2013-08-21 NOTE — Telephone Encounter (Signed)
[facility] calls today, advised caller that Dr. [name] did attempt to call them on 3/11. They will wait for Dr. [name] to call again. Please advise

## 2013-08-21 NOTE — Telephone Encounter (Signed)
I called pt and obtained his verbal permission to call [employer] and discuss his PHI with them, he gave his permission.  I then attempted again to call [facility] again but waited on hold without an answer for 5 minutes before hanging up. Will try again at a later time. If [employer] calls again please ask them to give me a direct number which will allow me to reach someone quickly. I do not have time to sit and wait on hold.

## 2013-08-22 ENCOUNTER — Telehealth: Payer: Self-pay | Admitting: Family Medicine

## 2013-08-22 NOTE — Telephone Encounter (Signed)
Patient calls back, will not be bringing paperwork now. Please disregard.

## 2013-08-22 NOTE — Telephone Encounter (Signed)
Noted.  Marixa Mellott L, CMA  

## 2013-08-22 NOTE — Telephone Encounter (Signed)
Form placed in MD box.  Rulon Abdalla L, CMA  

## 2013-08-22 NOTE — Telephone Encounter (Signed)
Steven [name] did bring in the [facility] form to be completed. He had taken it to [facility] and was told it did need to be completed by pts PCP>

## 2013-08-22 NOTE — Telephone Encounter (Signed)
Margarette Asal (friend) will be bringing over a form to be filled out so he can ride [facility]. He says everything is on the form Please let him know when it is done

## 2013-08-24 NOTE — Telephone Encounter (Signed)
Form signed on Friday 3/13 and placed on [place] RN's desk.  Leeanne Rio, MD

## 2013-08-25 ENCOUNTER — Telehealth: Payer: Self-pay | Admitting: *Deleted

## 2013-08-25 NOTE — Telephone Encounter (Signed)
Pt called and informed that [facility] Eligibility form is completed and will be faxed to office.  Pt can pick up forms in front office.  Derl Barrow, RN

## 2013-08-27 NOTE — Telephone Encounter (Signed)
Will forward to MD.   Malani Lees L, CMA  

## 2013-08-27 NOTE — Telephone Encounter (Signed)
Pamala Hurry called again about the wheelchair evaulation. Number is [phone] which is a call center Reference # is [id number]

## 2013-09-01 NOTE — Telephone Encounter (Signed)
Called [employer] back. They will fax over paperwork so that we can order his wheelchair. Will await this paperwork.  Leeanne Rio, MD

## 2013-09-08 ENCOUNTER — Ambulatory Visit: Payer: Medicaid Other | Admitting: Family Medicine

## 2013-09-08 ENCOUNTER — Encounter: Payer: Self-pay | Admitting: Family Medicine

## 2013-09-08 NOTE — Telephone Encounter (Signed)
Received request from [employer] for more information for pt's SCAT application. Wrote & printed letter with the requested information. Will fax back to [employer].  Leeanne Rio, MD

## 2013-09-09 ENCOUNTER — Telehealth: Payer: Self-pay | Admitting: Family Medicine

## 2013-09-09 NOTE — Telephone Encounter (Signed)
Will FWD to MD.  I did not see a diagnosis that would be appropriate.  Caileb Rhue, Loralyn Freshwater, [place]

## 2013-09-09 NOTE — Telephone Encounter (Signed)
Steven [name] w/ [facility]:  She needs diagnosed disability or medical condition Please advise

## 2013-09-11 NOTE — Telephone Encounter (Signed)
LMVM for Kennyth Lose to please return my call.  When she does, please give MD message below.  Henna Derderian, Loralyn Freshwater, [place]

## 2013-09-11 NOTE — Telephone Encounter (Signed)
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 Please inform [facility] contact.  Leeanne Rio, MD

## 2013-09-16 NOTE — Telephone Encounter (Signed)
Paperwork received. Pt has appt scheduled on 4/9 to discuss wheelchair, will await this appt to send in paperwork. He no showed to an appointment with me last week.  Leeanne Rio, MD

## 2013-09-18 ENCOUNTER — Encounter: Payer: Self-pay | Admitting: Family Medicine

## 2013-09-18 ENCOUNTER — Ambulatory Visit (INDEPENDENT_AMBULATORY_CARE_PROVIDER_SITE_OTHER): Payer: Medicaid Other | Admitting: Family Medicine

## 2013-09-18 ENCOUNTER — Telehealth: Payer: Self-pay | Admitting: Family Medicine

## 2013-09-18 VITALS — BP 113/78 | HR 106 | Temp 98.3°F | Ht 76.0 in | Wt 228.0 lb

## 2013-09-18 DIAGNOSIS — IMO0001 Reserved for inherently not codable concepts without codable children: Secondary | ICD-10-CM

## 2013-09-18 DIAGNOSIS — IMO0002 Reserved for concepts with insufficient information to code with codable children: Secondary | ICD-10-CM

## 2013-09-18 DIAGNOSIS — R29818 Other symptoms and signs involving the nervous system: Secondary | ICD-10-CM

## 2013-09-18 DIAGNOSIS — R2689 Other abnormalities of gait and mobility: Secondary | ICD-10-CM

## 2013-09-18 DIAGNOSIS — R21 Rash and other nonspecific skin eruption: Secondary | ICD-10-CM

## 2013-09-18 DIAGNOSIS — E1165 Type 2 diabetes mellitus with hyperglycemia: Secondary | ICD-10-CM

## 2013-09-18 LAB — POCT GLYCOSYLATED HEMOGLOBIN (HGB A1C)

## 2013-09-18 MED ORDER — HYDROCORTISONE 1 % EX OINT: 1.0000 "application " | TOPICAL_OINTMENT | Freq: Two times a day (BID) | CUTANEOUS | Status: DC | PRN

## 2013-09-18 NOTE — Patient Instructions (Signed)
Today we did your wheelchair assessment. [facility] will be in touch with you about getting it.  For your rash, I think these are bug bites. I've sent in a prescription for some hydrocortisone ointment. Follow up if they are not improving in the next week.  Schedule a separate visit to come back and follow up on your chronic medical problems, sometime within the next month.  Be well, Dr. [name]

## 2013-09-18 NOTE — Telephone Encounter (Signed)
Patient having issues with transportation. Please advise.

## 2013-09-18 NOTE — Telephone Encounter (Signed)
Clinical Social Worker (CSW) contacted pt and gave him the number to [place] transportation [phone]. CSW informed pt that he needs to call in advance-they typically cannot provide same day transportation. Pt will contact the clinic back if he needs to reschedule for another day.  Hunt Oris, MSW, [place]

## 2013-09-18 NOTE — Progress Notes (Signed)
Patient ID: Steven [name], male   DOB: [birth year], 53 y.o.   MRN: [id number]  HPI:  Power wheelchair assessment: pt presents for a wheelchair assessment. He was evaluated by physical therapy in February and they recommended a power wheelchair. He has decreased mobility secondary to his multiple ray amputations and poorly controlled diabetes. A representative from [facility] accompanies him to today's appointment to assist in the wheelchair ordering process.  Bumps on arms: Pt also c/o bumps on his arms. They have been there for about one week. They are pruritic. He was outside prior to noticing them. They are only on his arms, nowhere else. Has not eaten any new foods or taken new medicines. No fevers or oral sores.   ROS: See HPI  [place]: hx DM poorly controlled with diabetic retinopathy, HTN, HLD  PHYSICAL EXAM: BP 113/78  Pulse 106  Temp(Src) 98.3 F (36.8 C) (Oral)  Ht 6\' 4"  (1.93 m)  Wt 228 lb (103.42 kg)  BMI 27.76 kg/m2 Gen: NAD HEENT: NCAT, MMM, no oral lesions Lungs: normal respiratory effort Neuro: grossly nonfocal, speech normal MSK: ambulates across room unsteadily and has to grab onto items to walk, still unsteady Skin: ~1cm erythematous papules present on arms, appearance consistent with possible mosquito/other bug bites. No signs of superinfection  ASSESSMENT/PLAN:  # Power wheelchair assessment: I have reviewed and concur with the PT evaluation. Patient needs a new power wheelchair and meets medical qualifications. Orders signed today with [facility] representative. He needs a power wheelchair in order to safely ambulate/maneuver within his home. He is willing and motivated to use it consistently within his home.  # Rash on arms: appearance consistent with bug bites. No red flags. - will rx hydrocortisone ointment for prn use - f/u if not improving in the next week.  FOLLOW UP: F/u in several weeks for chronic medical problems (need to address poorly  controlled DM, etc with patient - could not do this today as the purpose of this visit was to evaluate for power wheelchair).  [place]. [name], [place]

## 2013-09-19 NOTE — Telephone Encounter (Signed)
Spoke with Kennyth Lose and she has the information that she needs at this point in time.  No additional attention needed.  [place]

## 2013-10-01 ENCOUNTER — Other Ambulatory Visit: Payer: Self-pay | Admitting: *Deleted

## 2013-10-01 MED ORDER — GABAPENTIN 300 MG PO CAPS: ORAL_CAPSULE | ORAL | Status: DC

## 2013-10-01 MED ORDER — ATORVASTATIN CALCIUM 40 MG PO TABS
40.0000 mg | ORAL_TABLET | Freq: Every day | ORAL | Status: DC
Start: 2013-10-01 — End: 2013-12-18

## 2013-10-23 ENCOUNTER — Encounter: Payer: Self-pay | Admitting: Family Medicine

## 2013-10-23 ENCOUNTER — Ambulatory Visit (INDEPENDENT_AMBULATORY_CARE_PROVIDER_SITE_OTHER): Payer: Medicaid Other | Admitting: Family Medicine

## 2013-10-23 VITALS — BP 118/74 | HR 87 | Temp 97.8°F | Resp 18

## 2013-10-23 DIAGNOSIS — E1165 Type 2 diabetes mellitus with hyperglycemia: Secondary | ICD-10-CM

## 2013-10-23 DIAGNOSIS — IMO0002 Reserved for concepts with insufficient information to code with codable children: Secondary | ICD-10-CM

## 2013-10-23 DIAGNOSIS — IMO0001 Reserved for inherently not codable concepts without codable children: Secondary | ICD-10-CM

## 2013-10-23 MED ORDER — INSULIN GLARGINE 100 UNIT/ML SOLOSTAR PEN: 35.0000 [IU] | PEN_INJECTOR | Freq: Every morning | SUBCUTANEOUS | Status: DC

## 2013-10-23 NOTE — Patient Instructions (Addendum)
It was great to see you again today!  Increase your insulin to 35 units daily. Check your blood sugar every morning before you eat and write it down on the log.  Dr. Ricki Laforge (eye doctor) phone number is [phone]. Please call his office to set up an appointment to have your eyes examined.  Be well, Dr. [name]   Insulin Glargine injection What is this medicine? INSULIN GLARGINE (IN su lin GLAR geen) is a human-made form of insulin. This drug lowers the amount of sugar in your blood. It is a long-acting insulin that is usually given once a day. This medicine may be used for other purposes; ask your health care provider or pharmacist if you have questions. COMMON BRAND NAME(S): Lantus SoloStar, Lantus What should I tell my health care provider before I take this medicine? They need to know if you have any of these conditions: -episodes of hypoglycemia -kidney disease -liver disease -an unusual or allergic reaction to insulin, metacresol, other medicines, foods, dyes, or preservatives -pregnant or trying to get pregnant -breast-feeding How should I use this medicine? This medicine is for injection under the skin. Use this medicine at the same time each day. Use exactly as directed. This insulin should never be mixed in the same syringe with other insulins before injection. Do not vigorously shake before use. You will be taught how to use this medicine and how to adjust doses for activities and illness. Do not use more insulin than prescribed. Always check the appearance of your insulin before using it. This medicine should be clear and colorless like water. Do not use it if it is cloudy, thickened, colored, or has solid particles in it. It is important that you put your used needles and syringes in a special sharps container. Do not put them in a trash can. If you do not have a sharps container, call your pharmacist or healthcare provider to get one. Talk to your pediatrician regarding  the use of this medicine in children. Special care may be needed. Overdosage: If you think you have taken too much of this medicine contact a poison control center or emergency room at once. NOTE: This medicine is only for you. Do not share this medicine with others. What if I miss a dose? It is important not to miss a dose. Your health care professional or doctor should discuss a plan for missed doses with you. If you do miss a dose, follow their plan. Do not take double doses. What may interact with this medicine? -other medicines for diabetes Many medications may cause an increase or decrease in blood sugar, these include: -alcohol containing beverages -aspirin and aspirin-like drugs -chloramphenicol -chromium -diuretics -male hormones, like estrogens or progestins and birth control pills -heart medicines -isoniazid -male hormones or anabolic steroids -medicines for weight loss -medicines for allergies, asthma, cold, or cough -medicines for mental problems -medicines called MAO Inhibitors like Nardil, Parnate, Marplan, Eldepryl -niacin -NSAIDs, medicines for pain and inflammation, like ibuprofen or naproxen -pentamidine -phenytoin -probenecid -quinolone antibiotics like ciprofloxacin, levofloxacin, ofloxacin -some herbal dietary supplements -steroid medicines like prednisone or cortisone -thyroid medicine Some medications can hide the warning symptoms of low blood sugar. You may need to monitor your blood sugar more closely if you are taking one of these medications. These include: -beta-blockers such as atenolol, metoprolol, propranolol -clonidine -guanethidine -reserpine This list may not describe all possible interactions. Give your health care provider a list of all the medicines, herbs, non-prescription drugs, or dietary supplements you  use. Also tell them if you smoke, drink alcohol, or use illegal drugs. Some items may interact with your medicine. What should I watch  for while using this medicine? Visit your health care professional or doctor for regular checks on your progress. A test called the HbA1C (A1C) will be monitored. This is a simple blood test. It measures your blood sugar control over the last 2 to 3 months. You will receive this test every 3 to 6 months. Learn how to check your blood sugar. Learn the symptoms of low and high blood sugar and how to manage them. Always carry a quick-source of sugar with you in case you have symptoms of low blood sugar. Examples include hard sugar candy or glucose tablets. Make sure others know that you can choke if you eat or drink when you develop serious symptoms of low blood sugar, such as seizures or unconsciousness. They must get medical help at once. Tell your doctor or health care professional if you have high blood sugar. You might need to change the dose of your medicine. If you are sick or exercising more than usual, you might need to change the dose of your medicine. Do not skip meals. Ask your doctor or health care professional if you should avoid alcohol. Many nonprescription cough and cold products contain sugar or alcohol. These can affect blood sugar. Make sure that you have the right kind of syringe for the type of insulin you use. Try not to change the brand and type of insulin or syringe unless your health care professional or doctor tells you to. Switching insulin brand or type can cause dangerously high or low blood sugar. Always keep an extra supply of insulin, syringes, and needles on hand. Use a syringe one time only. Throw away syringe and needle in a closed container to prevent accidental needle sticks. Insulin pens and cartridges should never be shared. Sharing may result in passing of viruses like hepatitis or HIV. Wear a medical ID bracelet or chain, and carry a card that describes your disease and details of your medicine and dosage times. What side effects may I notice from receiving this  medicine? Side effects that you should report to your health care professional or doctor as soon as possible: -allergic reactions like skin rash, itching or hives, swelling of the face, lips, or tongue -breathing problems -signs and symptoms of high blood sugar such as dizziness, dry mouth, dry skin, fruity breath, nausea, stomach pain, increased hunger or thirst, increased urination -signs and symptoms of low blood sugar such as feeling anxious, confusion, dizziness, increased hunger, unusually weak or tired, sweating, shakiness, cold, irritable, headache, blurred vision, fast heartbeat, loss of consciousness Side effects that usually do not require medical attention (report to your health care professional or doctor if they continue or are bothersome): -increase or decrease in fatty tissue under the skin due to overuse of a particular injection site -itching, burning, swelling, or rash at site where injected This list may not describe all possible side effects. Call your doctor for medical advice about side effects. You may report side effects to FDA at 1-800-FDA-1088. Where should I keep my medicine? Keep out of the reach of children. Store unopened vials in a refrigerator between 2 and 8 degrees C (36 and 46 degrees F). Do not freeze or use if the insulin has been frozen. Opened vials (vials currently in use) may be stored in the refrigerator or at room temperature, at approximately 25 degrees C (77  degrees F) or cooler. Keeping your insulin at room temperature decreases the amount of pain during injection. Once opened, your insulin can be used for 28 days. After 28 days, the vial should be thrown away. Store unopened pen-injector cartridges in a refrigerator between 2 and 8 degrees C (36 and 46 degrees F.) Do not freeze or use if the insulin has been frozen. Insulin cartridges inserted into the [employer] system should be kept at room temperature, approximately 25 degrees C (77 degrees F) or cooler.  Do not store in the refrigerator. Once inserted into the [employer] system, the insulin can be used for 28 days. After 28 days, the cartridge should be thrown away. Protect from light and excessive heat. Throw away any unused medicine after the expiration date or after the specified time for room temperature storage has passed. NOTE: This sheet is a summary. It may not cover all possible information. If you have questions about this medicine, talk to your doctor, pharmacist, or health care provider.  2014, Elsevier/Gold Standard. (2012-09-11 13:05:20)

## 2013-10-23 NOTE — Progress Notes (Signed)
Patient ID: JERROLD HASKELL, male   DOB: 13-Mar-1961, 53 y.o.   MRN: [id number]  HPI:  Diabetes: currently taking lantus 25 units every morning. Checks sugars in the mornings. Fastings are normally about 200. Lowest he ever got was 85. Typically 150's-200's. Highest fasting around 350. No problems with low blood sugars. Knows what to do if he feels hypoglycemic. Denies chest pain or SOB. Due for eye appt.  ROS: See HPI  [place]: hx diabetes, osteo requiring several ray amputations  PHYSICAL EXAM: BP 118/74  Pulse 87  Temp(Src) 97.8 F (36.6 C) (Oral)  Resp 18  SpO2 98% Gen: NAD, in motorized wheelchair HEENT: NCAT Heart: RRR Lungs: CTAB, NWOB Neuro: grossly nonfocal, speech normal  ASSESSMENT/PLAN:  See problem based charting for additional assessment/plan.   FOLLOW UP: F/u in 3 weeks for further titration of insulin.  [place]. [name], [place]

## 2013-10-28 ENCOUNTER — Telehealth: Payer: Self-pay | Admitting: Family Medicine

## 2013-10-28 DIAGNOSIS — E11319 Type 2 diabetes mellitus with unspecified diabetic retinopathy without macular edema: Secondary | ICD-10-CM

## 2013-10-28 NOTE — Telephone Encounter (Signed)
This appears to be for [facility] [place], based on google. Okay to refer to them. Will enter order.  Leeanne Rio, MD

## 2013-10-28 NOTE — Telephone Encounter (Signed)
Pt called eye dr at [phone]. He doesn't know the name of the dr.  He was told a referral needed to be fax.   Fax    [phone] Please call him when this is done

## 2013-10-28 NOTE — Telephone Encounter (Signed)
Forward to PCP to place referral for diabetic eye exam.Steven [name]

## 2013-10-29 ENCOUNTER — Telehealth: Payer: Self-pay | Admitting: Family Medicine

## 2013-10-29 DIAGNOSIS — IMO0001 Reserved for inherently not codable concepts without codable children: Secondary | ICD-10-CM

## 2013-10-29 DIAGNOSIS — E1165 Type 2 diabetes mellitus with hyperglycemia: Principal | ICD-10-CM

## 2013-10-29 MED ORDER — GLUCOSE BLOOD VI STRP: ORAL_STRIP | Status: DC

## 2013-10-29 NOTE — Telephone Encounter (Signed)
Needs refill on test strips Same pharmacy

## 2013-10-29 NOTE — Telephone Encounter (Signed)
Sent in to [place], MD

## 2013-11-02 NOTE — Assessment & Plan Note (Signed)
Poorly controlled as evidenced by A1c of >14 last month. Will increase lantus to 35 units daily. Reiterated importance of checking at least fasting sugar daily. Printed off and gave him a CBG log to fill out. F/u in 3 weeks for further titration of insulin.  Cardiac: on aspirin, statin Renal: on ACE Eye: due for eye appt, gave phone # and instructed pt to call and schedule appt with his eye doctor Foot: due June 2015

## 2013-11-05 ENCOUNTER — Telehealth: Payer: Self-pay | Admitting: *Deleted

## 2013-11-05 NOTE — Telephone Encounter (Signed)
Received fax from [facility] needing a Rx for Accu -check Fast Clix Lancets.  Please fax to [phone].  Derl Barrow, RN

## 2013-11-06 MED ORDER — ACCU-CHEK FASTCLIX LANCETS MISC: Status: DC

## 2013-11-06 NOTE — Telephone Encounter (Signed)
Rx sent in. Thanks! Brittany J McIntyre, MD  

## 2013-11-06 NOTE — Addendum Note (Signed)
Addended by: Leeanne Rio on: 11/06/2013 08:26 AM   Modules accepted: Orders

## 2013-11-17 ENCOUNTER — Ambulatory Visit: Payer: Medicaid Other | Admitting: Family Medicine

## 2013-11-17 ENCOUNTER — Other Ambulatory Visit (HOSPITAL_COMMUNITY): Payer: Self-pay | Admitting: Orthopedic Surgery

## 2013-11-18 ENCOUNTER — Other Ambulatory Visit: Payer: Self-pay | Admitting: *Deleted

## 2013-11-20 ENCOUNTER — Encounter (HOSPITAL_COMMUNITY): Payer: Self-pay | Admitting: *Deleted

## 2013-11-20 MED ORDER — CEFAZOLIN SODIUM-DEXTROSE 2-3 GM-% IV SOLR
2.0000 g | INTRAVENOUS | Status: AC
  Administered 2013-11-21: 2 g via INTRAVENOUS
  Filled 2013-11-20: qty 50

## 2013-11-20 MED ORDER — LISINOPRIL-HYDROCHLOROTHIAZIDE 20-25 MG PO TABS: 1.0000 | ORAL_TABLET | Freq: Every day | ORAL | Status: DC

## 2013-11-20 NOTE — Progress Notes (Signed)
Mr Steven [name] is going to bring medications to hospital with him. "cant read too good."  I instructed Mr Steven [name] to not take Insulin in am, use inhaler if needed.  Patient reports morning blood sugar runs around 120.

## 2013-11-21 ENCOUNTER — Ambulatory Visit (HOSPITAL_COMMUNITY): Payer: Medicaid Other

## 2013-11-21 ENCOUNTER — Encounter (HOSPITAL_COMMUNITY): Payer: Medicaid Other | Admitting: Certified Registered"

## 2013-11-21 ENCOUNTER — Ambulatory Visit (HOSPITAL_COMMUNITY)
Admission: RE | Admit: 2013-11-21 | Discharge: 2013-11-22 | Disposition: A | Discharge status: Home / Self Care | Payer: Medicaid Other | Source: Ambulatory Visit | Attending: Orthopedic Surgery | Admitting: Orthopedic Surgery

## 2013-11-21 ENCOUNTER — Ambulatory Visit (HOSPITAL_COMMUNITY): Payer: Medicaid Other | Admitting: Certified Registered"

## 2013-11-21 ENCOUNTER — Encounter (HOSPITAL_COMMUNITY): Payer: Self-pay | Admitting: *Deleted

## 2013-11-21 ENCOUNTER — Encounter (HOSPITAL_COMMUNITY)
Admission: RE | Disposition: A | Discharge status: Home / Self Care | Payer: Medicaid Other | Source: Ambulatory Visit | Attending: Orthopedic Surgery

## 2013-11-21 DIAGNOSIS — Z794 Long term (current) use of insulin: Secondary | ICD-10-CM | POA: Insufficient documentation

## 2013-11-21 DIAGNOSIS — E1159 Type 2 diabetes mellitus with other circulatory complications: Secondary | ICD-10-CM | POA: Insufficient documentation

## 2013-11-21 DIAGNOSIS — I96 Gangrene, not elsewhere classified: Secondary | ICD-10-CM | POA: Insufficient documentation

## 2013-11-21 DIAGNOSIS — G4700 Insomnia, unspecified: Secondary | ICD-10-CM | POA: Insufficient documentation

## 2013-11-21 DIAGNOSIS — I10 Essential (primary) hypertension: Secondary | ICD-10-CM | POA: Insufficient documentation

## 2013-11-21 DIAGNOSIS — E1165 Type 2 diabetes mellitus with hyperglycemia: Secondary | ICD-10-CM

## 2013-11-21 DIAGNOSIS — F17200 Nicotine dependence, unspecified, uncomplicated: Secondary | ICD-10-CM | POA: Insufficient documentation

## 2013-11-21 DIAGNOSIS — E1169 Type 2 diabetes mellitus with other specified complication: Secondary | ICD-10-CM | POA: Insufficient documentation

## 2013-11-21 DIAGNOSIS — M9080 Osteopathy in diseases classified elsewhere, unspecified site: Secondary | ICD-10-CM | POA: Insufficient documentation

## 2013-11-21 DIAGNOSIS — E1142 Type 2 diabetes mellitus with diabetic polyneuropathy: Secondary | ICD-10-CM | POA: Insufficient documentation

## 2013-11-21 DIAGNOSIS — E1149 Type 2 diabetes mellitus with other diabetic neurological complication: Secondary | ICD-10-CM | POA: Insufficient documentation

## 2013-11-21 DIAGNOSIS — IMO0002 Reserved for concepts with insufficient information to code with codable children: Secondary | ICD-10-CM

## 2013-11-21 DIAGNOSIS — M86179 Other acute osteomyelitis, unspecified ankle and foot: Secondary | ICD-10-CM | POA: Diagnosis present

## 2013-11-21 DIAGNOSIS — M869 Osteomyelitis, unspecified: Secondary | ICD-10-CM

## 2013-11-21 DIAGNOSIS — Z86718 Personal history of other venous thrombosis and embolism: Secondary | ICD-10-CM | POA: Insufficient documentation

## 2013-11-21 HISTORY — PX: AMPUTATION: SHX166

## 2013-11-21 LAB — CBC
HCT: 43.7 % (ref 39.0–52.0)
Hemoglobin: 15.2 g/dL (ref 13.0–17.0)
MCH: 30.3 pg (ref 26.0–34.0)
MCHC: 34.8 g/dL (ref 30.0–36.0)
MCV: 87.1 fL (ref 78.0–100.0)
PLATELETS: 292 10*3/uL (ref 150–400)
RBC: 5.02 MIL/uL (ref 4.22–5.81)
RDW: 14.2 % (ref 11.5–15.5)
WBC: 11.6 10*3/uL — AB (ref 4.0–10.5)

## 2013-11-21 LAB — COMPREHENSIVE METABOLIC PANEL
ALT: 13 U/L (ref 0–53)
AST: 20 U/L (ref 0–37)
Albumin: 3.1 g/dL — ABNORMAL LOW (ref 3.5–5.2)
Alkaline Phosphatase: 110 U/L (ref 39–117)
BUN: 17 mg/dL (ref 6–23)
CALCIUM: 10.6 mg/dL — AB (ref 8.4–10.5)
CO2: 22 meq/L (ref 19–32)
Chloride: 90 mEq/L — ABNORMAL LOW (ref 96–112)
Creatinine, Ser: 0.84 mg/dL (ref 0.50–1.35)
GFR calc Af Amer: >90 mL/min (ref >90)
Glucose, Bld: 292 mg/dL — ABNORMAL HIGH (ref 70–99)
Potassium: 5.5 mEq/L — ABNORMAL HIGH (ref 3.7–5.3)
Sodium: 132 mEq/L — ABNORMAL LOW (ref 137–147)
Total Bilirubin: 0.4 mg/dL (ref 0.3–1.2)
Total Protein: 9 g/dL — ABNORMAL HIGH (ref 6.0–8.3)

## 2013-11-21 LAB — PROTIME-INR
INR: 1.05 (ref 0.00–1.49)
Prothrombin Time: 13.5 seconds (ref 11.6–15.2)

## 2013-11-21 LAB — GLUCOSE, CAPILLARY
GLUCOSE-CAPILLARY: 236 mg/dL — AB (ref 70–99)
GLUCOSE-CAPILLARY: 295 mg/dL — AB (ref 70–99)
Glucose-Capillary: 277 mg/dL — ABNORMAL HIGH (ref 70–99)
Glucose-Capillary: 292 mg/dL — ABNORMAL HIGH (ref 70–99)
Glucose-Capillary: 275 mg/dL — ABNORMAL HIGH (ref 70–99)

## 2013-11-21 LAB — APTT: aPTT: 27 seconds (ref 24–37)

## 2013-11-21 SURGERY — AMPUTATION, FOOT, RAY
Anesthesia: General | Site: Foot | Laterality: Left

## 2013-11-21 MED ORDER — ROCURONIUM BROMIDE 50 MG/5ML IV SOLN
INTRAVENOUS | Status: AC
  Filled 2013-11-21: qty 1

## 2013-11-21 MED ORDER — LISINOPRIL-HYDROCHLOROTHIAZIDE 20-25 MG PO TABS: 1.0000 | ORAL_TABLET | Freq: Every day | ORAL | Status: DC

## 2013-11-21 MED ORDER — OXYCODONE HCL 5 MG PO TABS: 5.0000 mg | ORAL_TABLET | Freq: Once | ORAL | Status: DC | PRN

## 2013-11-21 MED ORDER — MIDAZOLAM HCL 2 MG/2ML IJ SOLN
INTRAMUSCULAR | Status: AC
  Filled 2013-11-21: qty 2

## 2013-11-21 MED ORDER — MEPERIDINE HCL 25 MG/ML IJ SOLN: 6.2500 mg | INTRAMUSCULAR | Status: DC | PRN

## 2013-11-21 MED ORDER — LACTATED RINGERS IV SOLN
INTRAVENOUS | Status: DC
  Administered 2013-11-21: 12:00:00 via INTRAVENOUS

## 2013-11-21 MED ORDER — PHENYLEPHRINE 40 MCG/ML (10ML) SYRINGE FOR IV PUSH (FOR BLOOD PRESSURE SUPPORT)
PREFILLED_SYRINGE | INTRAVENOUS | Status: AC
  Filled 2013-11-21: qty 10

## 2013-11-21 MED ORDER — INSULIN ASPART 100 UNIT/ML ~~LOC~~ SOLN
0.0000 [IU] | Freq: Three times a day (TID) | SUBCUTANEOUS | Status: DC
  Administered 2013-11-21: 8 [IU] via SUBCUTANEOUS
  Administered 2013-11-22: 11 [IU] via SUBCUTANEOUS
  Administered 2013-11-22: 12 [IU] via SUBCUTANEOUS

## 2013-11-21 MED ORDER — GABAPENTIN 600 MG PO TABS
600.0000 mg | ORAL_TABLET | Freq: Every day | ORAL | Status: DC
  Administered 2013-11-21: 600 mg via ORAL
  Filled 2013-11-21 (×2): qty 1

## 2013-11-21 MED ORDER — HYDROMORPHONE HCL PF 1 MG/ML IJ SOLN: 0.5000 mg | INTRAMUSCULAR | Status: DC | PRN

## 2013-11-21 MED ORDER — CEFAZOLIN SODIUM 1-5 GM-% IV SOLN
1.0000 g | Freq: Four times a day (QID) | INTRAVENOUS | Status: AC
  Administered 2013-11-21 – 2013-11-22 (×2): 1 g via INTRAVENOUS
  Filled 2013-11-21 (×2): qty 50

## 2013-11-21 MED ORDER — OXYCODONE HCL 5 MG/5ML PO SOLN: 5.0000 mg | Freq: Once | ORAL | Status: DC | PRN

## 2013-11-21 MED ORDER — PROPOFOL 10 MG/ML IV BOLUS
INTRAVENOUS | Status: AC
  Filled 2013-11-21: qty 20

## 2013-11-21 MED ORDER — INSULIN ASPART 100 UNIT/ML ~~LOC~~ SOLN
4.0000 [IU] | Freq: Three times a day (TID) | SUBCUTANEOUS | Status: DC
  Administered 2013-11-21 – 2013-11-22 (×3): 4 [IU] via SUBCUTANEOUS

## 2013-11-21 MED ORDER — INSULIN GLARGINE 100 UNIT/ML ~~LOC~~ SOLN
30.0000 [IU] | Freq: Every day | SUBCUTANEOUS | Status: DC
  Administered 2013-11-21: 30 [IU] via SUBCUTANEOUS
  Filled 2013-11-21 (×2): qty 0.3

## 2013-11-21 MED ORDER — FENTANYL CITRATE 0.05 MG/ML IJ SOLN
INTRAMUSCULAR | Status: DC | PRN
  Administered 2013-11-21: 100 ug via INTRAVENOUS

## 2013-11-21 MED ORDER — DIPHENHYDRAMINE HCL 12.5 MG/5ML PO ELIX: 12.5000 mg | ORAL_SOLUTION | ORAL | Status: DC | PRN

## 2013-11-21 MED ORDER — METHOCARBAMOL 500 MG PO TABS: 500.0000 mg | ORAL_TABLET | Freq: Four times a day (QID) | ORAL | Status: DC | PRN

## 2013-11-21 MED ORDER — ONDANSETRON HCL 4 MG/2ML IJ SOLN: 4.0000 mg | Freq: Once | INTRAMUSCULAR | Status: DC | PRN

## 2013-11-21 MED ORDER — ALBUTEROL SULFATE HFA 108 (90 BASE) MCG/ACT IN AERS: 2.0000 | INHALATION_SPRAY | Freq: Four times a day (QID) | RESPIRATORY_TRACT | Status: DC | PRN

## 2013-11-21 MED ORDER — HYDROCHLOROTHIAZIDE 25 MG PO TABS
25.0000 mg | ORAL_TABLET | Freq: Every day | ORAL | Status: DC
  Administered 2013-11-21 – 2013-11-22 (×2): 25 mg via ORAL
  Filled 2013-11-21 (×2): qty 1

## 2013-11-21 MED ORDER — GABAPENTIN 300 MG PO CAPS
300.0000 mg | ORAL_CAPSULE | Freq: Two times a day (BID) | ORAL | Status: DC
  Administered 2013-11-22 (×2): 300 mg via ORAL
  Filled 2013-11-21 (×3): qty 1

## 2013-11-21 MED ORDER — CEFAZOLIN SODIUM 1-5 GM-% IV SOLN: 1.0000 g | Freq: Four times a day (QID) | INTRAVENOUS | Status: DC

## 2013-11-21 MED ORDER — LIDOCAINE HCL (CARDIAC) 20 MG/ML IV SOLN
INTRAVENOUS | Status: DC | PRN
  Administered 2013-11-21: 20 mg via INTRAVENOUS

## 2013-11-21 MED ORDER — ONDANSETRON HCL 4 MG/2ML IJ SOLN: 4.0000 mg | Freq: Four times a day (QID) | INTRAMUSCULAR | Status: DC | PRN

## 2013-11-21 MED ORDER — MIDAZOLAM HCL 5 MG/5ML IJ SOLN
INTRAMUSCULAR | Status: DC | PRN
  Administered 2013-11-21: 2 mg via INTRAVENOUS

## 2013-11-21 MED ORDER — METHOCARBAMOL 1000 MG/10ML IJ SOLN
500.0000 mg | Freq: Four times a day (QID) | INTRAVENOUS | Status: DC | PRN
  Filled 2013-11-21: qty 5

## 2013-11-21 MED ORDER — LISINOPRIL 20 MG PO TABS
20.0000 mg | ORAL_TABLET | Freq: Every day | ORAL | Status: DC
  Administered 2013-11-21 – 2013-11-22 (×2): 20 mg via ORAL
  Filled 2013-11-21 (×2): qty 1

## 2013-11-21 MED ORDER — SODIUM CHLORIDE 0.9 % IV SOLN: INTRAVENOUS | Status: DC

## 2013-11-21 MED ORDER — ONDANSETRON HCL 4 MG PO TABS: 4.0000 mg | ORAL_TABLET | Freq: Four times a day (QID) | ORAL | Status: DC | PRN

## 2013-11-21 MED ORDER — DOCUSATE SODIUM 100 MG PO CAPS
100.0000 mg | ORAL_CAPSULE | Freq: Two times a day (BID) | ORAL | Status: DC
  Administered 2013-11-21 – 2013-11-22 (×2): 100 mg via ORAL
  Filled 2013-11-21 (×3): qty 1

## 2013-11-21 MED ORDER — FENTANYL CITRATE 0.05 MG/ML IJ SOLN
INTRAMUSCULAR | Status: AC
  Filled 2013-11-21: qty 5

## 2013-11-21 MED ORDER — ALBUTEROL SULFATE (2.5 MG/3ML) 0.083% IN NEBU: 2.5000 mg | INHALATION_SOLUTION | Freq: Four times a day (QID) | RESPIRATORY_TRACT | Status: DC | PRN

## 2013-11-21 MED ORDER — ONDANSETRON HCL 4 MG/2ML IJ SOLN
INTRAMUSCULAR | Status: DC | PRN
  Administered 2013-11-21: 4 mg via INTRAVENOUS

## 2013-11-21 MED ORDER — PROPOFOL 10 MG/ML IV BOLUS
INTRAVENOUS | Status: DC | PRN
  Administered 2013-11-21: 160 mg via INTRAVENOUS

## 2013-11-21 MED ORDER — 0.9 % SODIUM CHLORIDE (POUR BTL) OPTIME
TOPICAL | Status: DC | PRN
  Administered 2013-11-21: 1000 mL

## 2013-11-21 MED ORDER — FLUTICASONE PROPIONATE 50 MCG/ACT NA SUSP
2.0000 | Freq: Every day | NASAL | Status: DC
  Filled 2013-11-21: qty 16

## 2013-11-21 MED ORDER — HYDROMORPHONE HCL PF 1 MG/ML IJ SOLN: 0.2500 mg | INTRAMUSCULAR | Status: DC | PRN

## 2013-11-21 MED ORDER — METOCLOPRAMIDE HCL 5 MG/ML IJ SOLN: 5.0000 mg | Freq: Three times a day (TID) | INTRAMUSCULAR | Status: DC | PRN

## 2013-11-21 MED ORDER — METOCLOPRAMIDE HCL 10 MG PO TABS: 5.0000 mg | ORAL_TABLET | Freq: Three times a day (TID) | ORAL | Status: DC | PRN

## 2013-11-21 MED ORDER — OXYCODONE-ACETAMINOPHEN 5-325 MG PO TABS
1.0000 | ORAL_TABLET | ORAL | Status: DC | PRN
  Administered 2013-11-21: 2 via ORAL
  Administered 2013-11-22 (×3): 1 via ORAL
  Filled 2013-11-21: qty 1
  Filled 2013-11-21: qty 2
  Filled 2013-11-21 (×2): qty 1

## 2013-11-21 MED ORDER — SUCCINYLCHOLINE CHLORIDE 20 MG/ML IJ SOLN
INTRAMUSCULAR | Status: AC
  Filled 2013-11-21: qty 1

## 2013-11-21 MED ORDER — LIDOCAINE HCL (CARDIAC) 20 MG/ML IV SOLN
INTRAVENOUS | Status: AC
  Filled 2013-11-21: qty 5

## 2013-11-21 SURGICAL SUPPLY — 30 items
BANDAGE GAUZE ELAST BULKY 4 IN (GAUZE/BANDAGES/DRESSINGS) ×3 IMPLANT
BLADE SAW SGTL MED 73X18.5 STR (BLADE) IMPLANT
BNDG COHESIVE 4X5 TAN STRL (GAUZE/BANDAGES/DRESSINGS) ×3 IMPLANT
BNDG GAUZE ELAST 4 BULKY (GAUZE/BANDAGES/DRESSINGS) ×3 IMPLANT
COVER SURGICAL LIGHT HANDLE (MISCELLANEOUS) ×3 IMPLANT
DRAPE U-SHAPE 47X51 STRL (DRAPES) ×6 IMPLANT
DRSG ADAPTIC 3X8 NADH LF (GAUZE/BANDAGES/DRESSINGS) ×3 IMPLANT
DRSG PAD ABDOMINAL 8X10 ST (GAUZE/BANDAGES/DRESSINGS) ×3 IMPLANT
DURAPREP 26ML APPLICATOR (WOUND CARE) ×3 IMPLANT
ELECT REM PT RETURN 9FT ADLT (ELECTROSURGICAL) ×3
ELECTRODE REM PT RTRN 9FT ADLT (ELECTROSURGICAL) ×1 IMPLANT
GLOVE BIOGEL PI IND STRL 9 (GLOVE) ×1 IMPLANT
GLOVE BIOGEL PI INDICATOR 9 (GLOVE) ×2
GLOVE SURG ORTHO 9.0 STRL STRW (GLOVE) ×3 IMPLANT
GOWN STRL REUS W/ TWL XL LVL3 (GOWN DISPOSABLE) ×2 IMPLANT
GOWN STRL REUS W/TWL XL LVL3 (GOWN DISPOSABLE) ×4
KIT BASIN OR (CUSTOM PROCEDURE TRAY) ×3 IMPLANT
KIT ROOM TURNOVER OR (KITS) ×3 IMPLANT
NS IRRIG 1000ML POUR BTL (IV SOLUTION) ×3 IMPLANT
PACK ORTHO EXTREMITY (CUSTOM PROCEDURE TRAY) ×3 IMPLANT
PAD ARMBOARD 7.5X6 YLW CONV (MISCELLANEOUS) ×6 IMPLANT
SPONGE GAUZE 4X4 12PLY (GAUZE/BANDAGES/DRESSINGS) ×3 IMPLANT
SPONGE GAUZE 4X4 12PLY STER LF (GAUZE/BANDAGES/DRESSINGS) ×3 IMPLANT
SPONGE LAP 18X18 X RAY DECT (DISPOSABLE) ×3 IMPLANT
STOCKINETTE IMPERVIOUS LG (DRAPES) IMPLANT
SUT ETHILON 2 0 PSLX (SUTURE) ×6 IMPLANT
TOWEL OR 17X24 6PK STRL BLUE (TOWEL DISPOSABLE) ×3 IMPLANT
TOWEL OR 17X26 10 PK STRL BLUE (TOWEL DISPOSABLE) ×3 IMPLANT
UNDERPAD 30X30 INCONTINENT (UNDERPADS AND DIAPERS) ×3 IMPLANT
WATER STERILE IRR 1000ML POUR (IV SOLUTION) ×3 IMPLANT

## 2013-11-21 NOTE — Progress Notes (Signed)
[facility] 277 Dr Conrad [place] called and informed. States he will be over to see the pt.

## 2013-11-21 NOTE — Anesthesia Procedure Notes (Signed)
Procedure Name: LMA Insertion Date/Time: 11/21/2013 1:46 PM Performed by: Maeola Harman Pre-anesthesia Checklist: Patient identified, Emergency Drugs available, Suction available, Patient being monitored and Timeout performed Patient Re-evaluated:Patient Re-evaluated prior to inductionOxygen Delivery Method: Circle system utilized Preoxygenation: Pre-oxygenation with 100% oxygen Intubation Type: IV induction LMA: LMA inserted LMA Size: 5.0 Number of attempts: 1 Placement Confirmation: positive ETCO2 and breath sounds checked- equal and bilateral Tube secured with: Tape Dental Injury: Teeth and Oropharynx as per pre-operative assessment

## 2013-11-21 NOTE — Progress Notes (Signed)
Pt has a d/c home order, but doesn't have anybody to stay with him tonight at home. Informed Dr.[name] & the pt is admitting for observation. Informed his sister via phone.  Steven [name]

## 2013-11-21 NOTE — Anesthesia Preprocedure Evaluation (Addendum)
Anesthesia Evaluation  Patient identified by MRN, date of birth, ID band Patient awake    Reviewed: Allergy & Precautions, H&P , NPO status , Patient's Chart, lab work & pertinent test results  Airway Mallampati: I TM Distance: >3 FB Neck ROM: Full    Dental  (+) Dental Advisory Given, Edentulous Upper, Edentulous Lower   Pulmonary Current Smoker,  breath sounds clear to auscultation        Cardiovascular hypertension, Pt. on medications + Peripheral Vascular Disease Rhythm:Regular     Neuro/Psych  Neuromuscular disease    GI/Hepatic   Endo/Other  diabetes, Poorly Controlled, Type 2, Insulin Dependent  Renal/GU      Musculoskeletal   Abdominal (+)  Abdomen: soft. Bowel sounds: normal.  Peds  Hematology   Anesthesia Other Findings   Reproductive/Obstetrics                         Anesthesia Physical Anesthesia Plan  ASA: III  Anesthesia Plan: General   Post-op Pain Management:    Induction: Intravenous  Airway Management Planned: LMA  Additional Equipment:   Intra-op Plan:   Post-operative Plan: Extubation in OR  Informed Consent: I have reviewed the patients History and Physical, chart, labs and discussed the procedure including the risks, benefits and alternatives for the proposed anesthesia with the patient or authorized representative who has indicated his/her understanding and acceptance.     Plan Discussed with: CRNA and Surgeon  Anesthesia Plan Comments:         Anesthesia Quick Evaluation

## 2013-11-21 NOTE — Discharge Instructions (Signed)
Keep dressing left foot clean dry and intact. Minimize weightbearing. Followup in the office next week.  General Anesthesia, Adult, Care After  Refer to this sheet in the next few weeks. These instructions provide you with information on caring for yourself after your procedure. Your health care provider may also give you more specific instructions. Your treatment has been planned according to current medical practices, but problems sometimes occur. Call your health care provider if you have any problems or questions after your procedure.  WHAT TO EXPECT AFTER THE PROCEDURE  After the procedure, it is typical to experience:  Sleepiness.  Nausea and vomiting. HOME CARE INSTRUCTIONS  For the first 24 hours after general anesthesia:  Have a responsible person with you.  Do not drive a car. If you are alone, do not take public transportation.  Do not drink alcohol.  Do not take medicine that has not been prescribed by your health care provider.  Do not sign important papers or make important decisions.  You may resume a normal diet and activities as directed by your health care provider.  Change bandages (dressings) as directed.  If you have questions or problems that seem related to general anesthesia, call the hospital and ask for the anesthetist or anesthesiologist on call. SEEK MEDICAL CARE IF:  You have nausea and vomiting that continue the day after anesthesia.  You develop a rash. SEEK IMMEDIATE MEDICAL CARE IF:  You have difficulty breathing.  You have chest pain.  You have any allergic problems. Document Released: 09/04/2000 Document Revised: 01/29/2013 Document Reviewed: 12/12/2012  [facility] Patient Information 2014 [place], [place].   What to eat:  For your first meals, you should eat lightly; only small meals initially.  If you do not have nausea, you may eat larger meals.  Avoid spicy, greasy and heavy food.

## 2013-11-21 NOTE — H&P (Signed)
Steven [name] is an 53 y.o. male.   Chief Complaint: Osteomyelitis ulceration left foot great toe HPI: Patient is a 53 year old gentleman with diabetic insensate neuropathy who presents with osteomyelitis ulceration right great toe that has failed conservative care.  Past Medical History  Diagnosis Date  . DM (diabetes mellitus)   . HTN (hypertension)   . History of DVT (deep vein thrombosis)   . Personal history of diabetic foot ulcer   . Peripheral vascular disease   . Peripheral neuropathy 11/19/2011  . Personal history of diabetic foot ulcer   . Insomnia   . Sinus congestion   . Varicose vein     legs  . Osteomyelitis of foot, right, acute 08/30/2012  . Personal history of colonic adenomas 05/29/2013    Past Surgical History  Procedure Laterality Date  . Amputation  04/21/2011    Procedure: AMPUTATION RAY;  Surgeon: Steven Minion, MD;  Location: [place];  Service: Orthopedics;  Laterality: Right;  Rt foot 2nd ray ampt  . I&d extremity  05/03/2011    Procedure: IRRIGATION AND DEBRIDEMENT EXTREMITY;  Surgeon: Steven Minion, MD;  Location: [place];  Service: Orthopedics;  Laterality: Right;  Irrigation and Debridement Right Foot and Place Acell Xenograft  . Toe amputation  04/24/2012    great toe   right foot  . Amputation  04/24/2012    Procedure: AMPUTATION RAY;  Surgeon: Steven Minion, MD;  Location: [place];  Service: Orthopedics;  Laterality: Right;  Right foot 1st ray amputation  . Amputation Left 04/23/2013    Procedure: AMPUTATION RAY;  Surgeon: Steven Minion, MD;  Location: [place];  Service: Orthopedics;  Laterality: Left;  Left Foot 5th Ray Amputation  . Colonoscopy      Family History  Problem Relation Age of Onset  . Diabetes Mother   . Stroke Brother   . Colon cancer Neg Hx    Social History:  reports that he has been smoking Cigarettes.  He has a 15 pack-year smoking history. He has never used smokeless tobacco. He reports that he drinks about 2.5 - 3 ounces of alcohol  per week. He reports that he does not use illicit drugs.  Allergies: No Known Allergies  No prescriptions prior to admission    No results found for this or any previous visit (from the past 48 hour(s)). No results found.  Review of Systems  All other systems reviewed and are negative.   There were no vitals taken for this visit. Physical Exam  On examination patient has a palpable dorsalis pedis pulse there is ulceration abscess and osteomyelitis of the great toe. Assessment/Plan Assessment: Ulceration osteomyelitis right great toe.  Plan: We'll plan for first ray amputation. Risks and benefits were discussed including nonhealing of the wound persistent infection need for additional surgery. Patient states he understands and wished to proceed at this time.  Steven [name] V 11/21/2013, 6:22 AM

## 2013-11-21 NOTE — Transfer of Care (Signed)
Immediate Anesthesia Transfer of Care Note  Patient: Steven [name]  Procedure(s) Performed: Procedure(s) with comments: AMPUTATION RAY (Left) - Left Foot 1st & 2nd Ray Amputation  Patient Location: PACU  Anesthesia Type:General  Level of Consciousness: awake, alert  and oriented  Airway & Oxygen Therapy: Patient connected to face mask oxygen  Post-op Assessment: Report given to PACU RN  Post vital signs: stable  Complications: No apparent anesthesia complications

## 2013-11-21 NOTE — Op Note (Signed)
11/21/2013  2:12 PM  PATIENT:  Steven [name]    PRE-OPERATIVE DIAGNOSIS:  Abscess Osteomyelitis Left Great Toe  POST-OPERATIVE DIAGNOSIS: Abscess osteomyelitis left great toe and left second toe.  PROCEDURE:  AMPUTATION RAY left foot first ray. Amputation second toe at the MTP joint.  SURGEON:  Newt Minion, MD  PHYSICIAN ASSISTANT:None ANESTHESIA:   General  PREOPERATIVE INDICATIONS:  Steven [name] is a  53 y.o. male with a diagnosis of Abscess Osteomyelitis Left Great Toe who failed conservative measures and elected for surgical management.    The risks benefits and alternatives were discussed with the patient preoperatively including but not limited to the risks of infection, bleeding, nerve injury, cardiopulmonary complications, the need for revision surgery, among others, and the patient was willing to proceed.  OPERATIVE IMPLANTS: None  OPERATIVE FINDINGS: Osteomyelitis involving the second toe  OPERATIVE PROCEDURE: Patient was brought to the operating room and underwent a general anesthetic. After adequate levels of anesthesia were obtained patient's left lower extremity was prepped using DuraPrep draped into a sterile field. A timeout was called. A racquet incision was made around the first ray. The second toe had an ulcer and after debridement the ulcer did go down to bone so the first ray amputation was performed as well as the second toe through the MTP joint amputation. Electrocautery was used for hemostasis. The wound was irrigated with normal saline. The skin was closed using 2-0 nylon. The wound is covered with a sterile compressive dressing. Patient was extubated taken to the PACU in stable condition.

## 2013-11-21 NOTE — Anesthesia Postprocedure Evaluation (Signed)
Anesthesia Post Note  Patient: Steven [name]  Procedure(s) Performed: Procedure(s) (LRB): AMPUTATION RAY (Left)  Anesthesia type: general  Patient location: PACU  Post pain: Pain level controlled  Post assessment: Patient's Cardiovascular Status Stable  Last Vitals:  Filed Vitals:   11/21/13 1545  BP: 131/87  Pulse: 95  Temp:   Resp: 23    Post vital signs: Reviewed and stable  Level of consciousness: sedated  Complications: No apparent anesthesia complications

## 2013-11-22 LAB — GLUCOSE, CAPILLARY
Glucose-Capillary: 300 mg/dL — ABNORMAL HIGH (ref 70–99)
Glucose-Capillary: 321 mg/dL — ABNORMAL HIGH (ref 70–99)

## 2013-11-22 MED ORDER — PNEUMOCOCCAL VAC POLYVALENT 25 MCG/0.5ML IJ INJ
0.5000 mL | INJECTION | Freq: Once | INTRAMUSCULAR | Status: AC
  Administered 2013-11-22: 0.5 mL via INTRAMUSCULAR
  Filled 2013-11-22: qty 0.5

## 2013-11-22 NOTE — Progress Notes (Signed)
Orthopedic Tech Progress Note Patient Details:  Steven [name] 04/28/1961 [id number]  Ortho Devices Type of Ortho Device: Postop shoe/boot Ortho Device/Splint Interventions: Application   Cammer, Theodoro Parma 11/22/2013, 2:27 PM

## 2013-11-22 NOTE — Progress Notes (Signed)
  Discharge instructions reviewed and handout given. IV out. Patient ready for discharged. Pt's sister to be ride.

## 2013-11-22 NOTE — Evaluation (Signed)
Physical Therapy Evaluation and Discharge Patient Details Name: Steven [name] MRN: [id number] DOB: 08-Apr-1961 Today's Date: 11/22/2013   History of Present Illness  53 y.o. male s/p amputation of first ray of left foot and second toe at MTP joint left foot.  Clinical Impression  Patient evaluated by Physical Therapy with no further acute PT needs identified. All education has been completed and the patient has no further questions. He reports that he will have care as needed from near-by friends but requests further PT services at home. Feel he would benefit from HHPT due to bilateral bandaging of feet, which may be a risk for balance loss on surfaces other than a hard floor. See below for any follow-up Physial Therapy or equipment needs. PT is signing off. Thank you for this referral.     Follow Up Recommendations Home health PT;Supervision for mobility/OOB    Equipment Recommendations  3in1 (PT)    Recommendations for Other Services  (Pt declines having OT consult)     Precautions / Restrictions Precautions Precautions: None Restrictions Weight Bearing Restrictions: Yes RLE Weight Bearing: Weight bearing as tolerated LLE Weight Bearing: Weight bearing as tolerated      Mobility  Bed Mobility Overal bed mobility: Modified Independent             General bed mobility comments: No cues or assist needed  Transfers Overall transfer level: Needs assistance Equipment used: Rolling walker (2 wheeled) Transfers: Sit to/from Stand Sit to Stand: Supervision         General transfer comment: Supervision for safety with VCs for hand placement. Performed from lowest bed setting. Requires extra time. Good stability once standing with RW under UEs for support.  Ambulation/Gait Ambulation/Gait assistance: Supervision Ambulation Distance (Feet): 65 Feet Assistive device: Rolling walker (2 wheeled) Gait Pattern/deviations: Step-through pattern;Decreased stride length;Antalgic    Gait velocity interpretation: Below normal speed for age/gender General Gait Details: Ambulates slowly but has good stability with UEs on RW. Offered to adjust RW height to more appropriate level but pt states he does not want it changed. No loss of balance noted during distance ambulated.  Stairs            Wheelchair Mobility    Modified Rankin (Stroke Patients Only)       Balance Overall balance assessment: Needs assistance Sitting-balance support: No upper extremity supported;Feet supported Sitting balance-Leahy Scale: Good     Standing balance support: Bilateral upper extremity supported Standing balance-Leahy Scale: Fair Standing balance comment: Stands briefly without UE support                             Pertinent Vitals/Pain 9/10 pain Nurse notified Patient repositioned in chair for comfort.     Home Living Family/patient expects to be discharged to:: Private residence Living Arrangements: Alone Available Help at Discharge: Friend(s) (states he can call friends to help as needed) Type of Home: Apartment Home Access: Level entry     Home Layout: One level Home Equipment: Walker - 2 wheels;Cane - single point;Wheelchair - power      Prior Function Level of Independence: Independent               Hand Dominance   Dominant Hand: Right    Extremity/Trunk Assessment   Upper Extremity Assessment: Overall WFL for tasks assessed           Lower Extremity Assessment: RLE deficits/detail;LLE deficits/detail RLE Deficits / Details: Unable to  assess right foot due to bandaging. able to move ankles and toes. LLE Deficits / Details: Unable to assess left foot due to bandaging. able to move ankles and toes.     Communication   Communication: No difficulties  Cognition Arousal/Alertness: Awake/alert Behavior During Therapy: WFL for tasks assessed/performed Overall Cognitive Status: Within Functional Limits for tasks assessed                       General Comments General comments (skin integrity, edema, etc.): Unable to assess feet due to bandaging. Pt complains of "tingling" in feet. Educated on keeping LEs elevated.    Exercises General Exercises - Lower Extremity Ankle Circles/Pumps: AROM;Both;10 reps;Seated      Assessment/Plan    PT Assessment Patent does not need any further PT services  PT Diagnosis     PT Problem List    PT Treatment Interventions     PT Goals (Current goals can be found in the Care Plan section) Acute Rehab PT Goals Patient Stated Goal: Go home PT Goal Formulation: No goals set, d/c therapy    Frequency     Barriers to discharge        Co-evaluation               End of Session Equipment Utilized During Treatment: Gait belt Activity Tolerance: Patient tolerated treatment well Patient left: in chair;with call bell/phone within reach Nurse Communication: Mobility status    Functional Assessment Tool Used: Clinical observation - assessment Functional Limitation: Mobility: Walking and moving around Mobility: Walking and Moving Around Current Status ([id number]): At least 1 percent but less than 20 percent impaired, limited or restricted Mobility: Walking and Moving Around Goal Status [phone]): At least 1 percent but less than 20 percent impaired, limited or restricted Mobility: Walking and Moving Around Discharge Status [phone]): At least 1 percent but less than 20 percent impaired, limited or restricted    Time: 0939-1002 PT Time Calculation (min): 23 min   Charges:   PT Evaluation $Initial PT Evaluation Tier I: 1 Procedure PT Treatments $Gait Training: 8-22 mins   PT G Codes:   Functional Assessment Tool Used: Clinical observation - assessment Functional Limitation: Mobility: Walking and moving around   [place], [place]  Ellouise Newer 11/22/2013, 11:24 AM

## 2013-11-22 NOTE — Progress Notes (Signed)
   Subjective:  Patient reports pain as none.    Objective:   VITALS:   Filed Vitals:   11/21/13 1700 11/21/13 1953 11/22/13 0203 11/22/13 0611  BP: 136/90 103/68 115/71 103/72  Pulse: 94 90 85 90  Temp: 99.3 F (37.4 C) 98.9 F (37.2 C) 98.4 F (36.9 C) 98.7 F (37.1 C)  TempSrc:  Oral Oral Oral  Resp: 18 18 18 18   Height:      Weight:      SpO2: 99% 98% 98% 98%    Dressings intact Toe wwp   Lab Results  Component Value Date   WBC 11.6* 11/21/2013   HGB 15.2 11/21/2013   HCT 43.7 11/21/2013   MCV 87.1 11/21/2013   PLT 292 11/21/2013     Assessment/Plan:  1 Day Post-Op   - Up with PT/OT - DVT ppx - SCDs, ambulation - WBAT left lower extremity - Pain control - Discharge planning - home today after PT  Problem List Items Addressed This Visit     Endocrine   Diabetes mellitus type II, uncontrolled   Relevant Medications      insulin glargine (LANTUS) 100 UNIT/ML injection      insulin glargine (LANTUS) injection 30 Units      insulin aspart (novoLOG) injection 0-15 Units      insulin aspart (novoLOG) injection 4 Units      lisinopril (PRINIVIL,ZESTRIL) tablet 20 mg   Other Relevant Orders      Call MD / Call 911      Diet - low sodium heart healthy      Constipation Prevention      Increase activity slowly as tolerated      Post-op shoe     Musculoskeletal and Integument   Osteomyelitis of foot - Primary   Relevant Medications      ceFAZolin (ANCEF) IVPB 2 g/50 mL premix (Completed)      ceFAZolin (ANCEF) IVPB 1 g/50 mL premix (Completed)   Other Relevant Orders      Call MD / Call 911      Diet - low sodium heart healthy      Constipation Prevention      Increase activity slowly as tolerated      Post-op shoe      Call MD / Call 911      Diet - low sodium heart healthy      Constipation Prevention      Diet general      Increase activity slowly as tolerated      Driving restrictions       Marianna Payment 11/22/2013, 7:58  AM [phone]

## 2013-11-22 NOTE — Discharge Summary (Signed)
Physician Discharge Summary      Patient ID: Steven [name] MRN: [id number] DOB/AGE: 1960/06/26 53 y.o.  Admit date: 11/21/2013 Discharge date: 11/22/2013  Admission Diagnoses:  Osteomyelitis of left 3rd toe  Discharge Diagnoses:  Active Problems:   Osteomyelitis of ankle or foot, acute   Gangrene of foot   Past Medical History  Diagnosis Date  . DM (diabetes mellitus)   . HTN (hypertension)   . History of DVT (deep vein thrombosis)   . Personal history of diabetic foot ulcer   . Peripheral vascular disease   . Peripheral neuropathy 11/19/2011  . Personal history of diabetic foot ulcer   . Insomnia   . Sinus congestion   . Varicose vein     legs  . Osteomyelitis of foot, right, acute 08/30/2012  . Personal history of colonic adenomas 05/29/2013    Surgeries: Procedure(s): AMPUTATION RAY on 11/21/2013   Consultants (if any):    Discharged Condition: Improved  Hospital Course: Steven [name] is an 53 y.o. male who was admitted 11/21/2013 with a diagnosis of above mentioned condition and went to the operating room on 11/21/2013 and underwent the above named procedures.    He was given perioperative antibiotics:      Anti-infectives   Start     Dose/Rate Route Frequency Ordered Stop   11/21/13 2200  ceFAZolin (ANCEF) IVPB 1 g/50 mL premix     1 g 100 mL/hr over 30 Minutes Intravenous Every 6 hours 11/21/13 1706 11/22/13 0718   11/21/13 1700  ceFAZolin (ANCEF) IVPB 1 g/50 mL premix  Status:  Discontinued     1 g 100 mL/hr over 30 Minutes Intravenous Every 6 hours 11/21/13 1658 11/21/13 1706   11/21/13 0600  ceFAZolin (ANCEF) IVPB 2 g/50 mL premix     2 g 100 mL/hr over 30 Minutes Intravenous On call to O.R. 11/20/13 1437 11/21/13 1347    .  He was given sequential compression devices, early ambulation for DVT prophylaxis.  He benefited maximally from the hospital stay and there were no complications.    Recent vital signs:  Filed Vitals:   11/22/13 0611  BP:  103/72  Pulse: 90  Temp: 98.7 F (37.1 C)  Resp: 18    Recent laboratory studies:  Lab Results  Component Value Date   HGB 15.2 11/21/2013   HGB 14.6 04/22/2013   HGB 15.0 08/30/2012   Lab Results  Component Value Date   WBC 11.6* 11/21/2013   PLT 292 11/21/2013   Lab Results  Component Value Date   INR 1.05 11/21/2013   Lab Results  Component Value Date   NA 132* 11/21/2013   K 5.5* 11/21/2013   CL 90* 11/21/2013   CO2 22 11/21/2013   BUN 17 11/21/2013   CREATININE 0.84 11/21/2013   GLUCOSE 292* 11/21/2013    Discharge Medications:     Medication List         ACCU-CHEK FASTCLIX LANCETS Misc  Check blood sugar 4x daily     albuterol 108 (90 BASE) MCG/ACT inhaler  Commonly known as:  PROVENTIL HFA;VENTOLIN HFA  Inhale 2 puffs into the lungs every 6 (six) hours as needed for wheezing or shortness of breath.     atorvastatin 40 MG tablet  Commonly known as:  LIPITOR  Take 1 tablet (40 mg total) by mouth daily.     cetirizine 10 MG tablet  Commonly known as:  ZYRTEC  Take 1 tablet (10 mg total) by mouth daily.  doxycycline 100 MG capsule  Commonly known as:  VIBRAMYCIN  Take 100 mg by mouth 2 (two) times daily.     fluticasone 50 MCG/ACT nasal spray  Commonly known as:  FLONASE  Place 2 sprays into both nostrils daily.     gabapentin 300 MG capsule  Commonly known as:  NEURONTIN  Take 300-600 mg by mouth 3 (three) times daily. Take 300mg  in the morning, take 300mg  at lunch, and take 600mg  at bedtime.     glucose blood test strip  Commonly known as:  ACCU-CHEK AVIVA PLUS  Use to Test Blood Sugars four times Daily.  Dx: 250.02     hydrocortisone cream 1 %  Apply 1 application topically 2 (two) times daily as needed for itching.     ibuprofen 200 MG tablet  Commonly known as:  ADVIL,MOTRIN  Take 200 mg by mouth every 6 (six) hours as needed for moderate pain.     insulin glargine 100 UNIT/ML injection  Commonly known as:  LANTUS  Inject 30 Units into the  skin at bedtime.     lisinopril-hydrochlorothiazide 20-25 MG per tablet  Commonly known as:  PRINZIDE,ZESTORETIC  Take 1 tablet by mouth daily.     oxyCODONE-acetaminophen 5-325 MG per tablet  Commonly known as:  PERCOCET/ROXICET  Take 1 tablet by mouth 2 (two) times daily as needed for severe pain.        Diagnostic Studies: Dg Chest 2 View  11/21/2013   CLINICAL DATA:  Osteomyelitis left great toe.  Diabetes.  EXAM: CHEST  2 VIEW  COMPARISON:  04/24/2012  FINDINGS: Heart is normal size. No confluent airspace opacities or effusions. No acute bony abnormality.  IMPRESSION: No active disease.   Electronically Signed   By: Rolm Baptise M.D.   On: 11/21/2013 12:57    Disposition: 01-Home or Self Care  Discharge Instructions   Call MD / Call 911    Complete by:  As directed   If you experience chest pain or shortness of breath, CALL 911 and be transported to the hospital emergency room.  If you develope a fever above 101 F, pus (white drainage) or increased drainage or redness at the wound, or calf pain, call your surgeon's office.     Call MD / Call 911    Complete by:  As directed   If you experience chest pain or shortness of breath, CALL 911 and be transported to the hospital emergency room.  If you develope a fever above 101.5 F, pus (white drainage) or increased drainage or redness at the wound, or calf pain, call your surgeon's office.     Constipation Prevention    Complete by:  As directed   Drink plenty of fluids.  Prune juice may be helpful.  You may use a stool softener, such as Colace (over the counter) 100 mg twice a day.  Use MiraLax (over the counter) for constipation as needed.     Constipation Prevention    Complete by:  As directed   Drink plenty of fluids.  Prune juice may be helpful.  You may use a stool softener, such as Colace (over the counter) 100 mg twice a day.  Use MiraLax (over the counter) for constipation as needed.     Diet - low sodium heart healthy     Complete by:  As directed      Diet - low sodium heart healthy    Complete by:  As directed      Diet  general    Complete by:  As directed      Driving restrictions    Complete by:  As directed   No driving while taking narcotic pain meds.     Increase activity slowly as tolerated    Complete by:  As directed      Increase activity slowly as tolerated    Complete by:  As directed      Post-op shoe    Complete by:  As directed            Follow-up Information   Follow up with [name],[name], MD In 1 week.   Specialty:  Orthopedic Surgery   Contact information:   [address] [place] [address] [phone]        Signed: Marianna Payment 11/22/2013, 2:24 PM

## 2013-11-24 ENCOUNTER — Encounter (HOSPITAL_COMMUNITY): Payer: Self-pay | Admitting: Orthopedic Surgery

## 2013-12-03 ENCOUNTER — Inpatient Hospital Stay (HOSPITAL_COMMUNITY)
Admission: EM | Admit: 2013-12-03 | Discharge: 2013-12-09 | DRG: 853 | Disposition: A | Discharge status: Rehabilitation Facility | Payer: Medicaid Other | Attending: Family Medicine | Admitting: Family Medicine

## 2013-12-03 ENCOUNTER — Emergency Department (HOSPITAL_COMMUNITY): Payer: Medicaid Other

## 2013-12-03 ENCOUNTER — Encounter (HOSPITAL_COMMUNITY): Payer: Self-pay | Admitting: Emergency Medicine

## 2013-12-03 DIAGNOSIS — Z833 Family history of diabetes mellitus: Secondary | ICD-10-CM

## 2013-12-03 DIAGNOSIS — I70299 Other atherosclerosis of native arteries of extremities, unspecified extremity: Secondary | ICD-10-CM | POA: Diagnosis present

## 2013-12-03 DIAGNOSIS — N179 Acute kidney failure, unspecified: Secondary | ICD-10-CM | POA: Diagnosis present

## 2013-12-03 DIAGNOSIS — E871 Hypo-osmolality and hyponatremia: Secondary | ICD-10-CM | POA: Diagnosis present

## 2013-12-03 DIAGNOSIS — F1410 Cocaine abuse, uncomplicated: Secondary | ICD-10-CM

## 2013-12-03 DIAGNOSIS — L0292 Furuncle, unspecified: Secondary | ICD-10-CM | POA: Diagnosis present

## 2013-12-03 DIAGNOSIS — I739 Peripheral vascular disease, unspecified: Secondary | ICD-10-CM

## 2013-12-03 DIAGNOSIS — F1010 Alcohol abuse, uncomplicated: Secondary | ICD-10-CM

## 2013-12-03 DIAGNOSIS — A480 Gas gangrene: Secondary | ICD-10-CM | POA: Diagnosis present

## 2013-12-03 DIAGNOSIS — Z86718 Personal history of other venous thrombosis and embolism: Secondary | ICD-10-CM

## 2013-12-03 DIAGNOSIS — G629 Polyneuropathy, unspecified: Secondary | ICD-10-CM | POA: Diagnosis present

## 2013-12-03 DIAGNOSIS — F1491 Cocaine use, unspecified, in remission: Secondary | ICD-10-CM | POA: Diagnosis present

## 2013-12-03 DIAGNOSIS — L03119 Cellulitis of unspecified part of limb: Secondary | ICD-10-CM

## 2013-12-03 DIAGNOSIS — Z87898 Personal history of other specified conditions: Secondary | ICD-10-CM | POA: Diagnosis present

## 2013-12-03 DIAGNOSIS — S98919A Complete traumatic amputation of unspecified foot, level unspecified, initial encounter: Secondary | ICD-10-CM

## 2013-12-03 DIAGNOSIS — Z823 Family history of stroke: Secondary | ICD-10-CM

## 2013-12-03 DIAGNOSIS — I4891 Unspecified atrial fibrillation: Secondary | ICD-10-CM

## 2013-12-03 DIAGNOSIS — L02619 Cutaneous abscess of unspecified foot: Secondary | ICD-10-CM | POA: Diagnosis present

## 2013-12-03 DIAGNOSIS — IMO0002 Reserved for concepts with insufficient information to code with codable children: Secondary | ICD-10-CM

## 2013-12-03 DIAGNOSIS — Z794 Long term (current) use of insulin: Secondary | ICD-10-CM

## 2013-12-03 DIAGNOSIS — I10 Essential (primary) hypertension: Secondary | ICD-10-CM

## 2013-12-03 DIAGNOSIS — L02419 Cutaneous abscess of limb, unspecified: Secondary | ICD-10-CM | POA: Diagnosis present

## 2013-12-03 DIAGNOSIS — M86171 Other acute osteomyelitis, right ankle and foot: Secondary | ICD-10-CM

## 2013-12-03 DIAGNOSIS — R6520 Severe sepsis without septic shock: Secondary | ICD-10-CM

## 2013-12-03 DIAGNOSIS — I70269 Atherosclerosis of native arteries of extremities with gangrene, unspecified extremity: Secondary | ICD-10-CM | POA: Diagnosis present

## 2013-12-03 DIAGNOSIS — Z720 Tobacco use: Secondary | ICD-10-CM

## 2013-12-03 DIAGNOSIS — F17200 Nicotine dependence, unspecified, uncomplicated: Secondary | ICD-10-CM | POA: Diagnosis present

## 2013-12-03 DIAGNOSIS — E1142 Type 2 diabetes mellitus with diabetic polyneuropathy: Secondary | ICD-10-CM | POA: Diagnosis present

## 2013-12-03 DIAGNOSIS — A419 Sepsis, unspecified organism: Principal | ICD-10-CM

## 2013-12-03 DIAGNOSIS — E1159 Type 2 diabetes mellitus with other circulatory complications: Secondary | ICD-10-CM | POA: Diagnosis present

## 2013-12-03 DIAGNOSIS — E1169 Type 2 diabetes mellitus with other specified complication: Secondary | ICD-10-CM

## 2013-12-03 DIAGNOSIS — I8390 Asymptomatic varicose veins of unspecified lower extremity: Secondary | ICD-10-CM | POA: Diagnosis present

## 2013-12-03 DIAGNOSIS — S88119A Complete traumatic amputation at level between knee and ankle, unspecified lower leg, initial encounter: Secondary | ICD-10-CM

## 2013-12-03 DIAGNOSIS — M9080 Osteopathy in diseases classified elsewhere, unspecified site: Secondary | ICD-10-CM | POA: Diagnosis present

## 2013-12-03 DIAGNOSIS — M86179 Other acute osteomyelitis, unspecified ankle and foot: Secondary | ICD-10-CM | POA: Diagnosis present

## 2013-12-03 DIAGNOSIS — I872 Venous insufficiency (chronic) (peripheral): Secondary | ICD-10-CM

## 2013-12-03 DIAGNOSIS — D62 Acute posthemorrhagic anemia: Secondary | ICD-10-CM | POA: Diagnosis not present

## 2013-12-03 DIAGNOSIS — I498 Other specified cardiac arrhythmias: Secondary | ICD-10-CM | POA: Diagnosis not present

## 2013-12-03 DIAGNOSIS — E1149 Type 2 diabetes mellitus with other diabetic neurological complication: Secondary | ICD-10-CM | POA: Diagnosis present

## 2013-12-03 DIAGNOSIS — E1165 Type 2 diabetes mellitus with hyperglycemia: Secondary | ICD-10-CM | POA: Diagnosis present

## 2013-12-03 DIAGNOSIS — L0293 Carbuncle, unspecified: Secondary | ICD-10-CM

## 2013-12-03 DIAGNOSIS — I96 Gangrene, not elsewhere classified: Secondary | ICD-10-CM

## 2013-12-03 DIAGNOSIS — E11319 Type 2 diabetes mellitus with unspecified diabetic retinopathy without macular edema: Secondary | ICD-10-CM | POA: Diagnosis present

## 2013-12-03 DIAGNOSIS — Z79899 Other long term (current) drug therapy: Secondary | ICD-10-CM

## 2013-12-03 DIAGNOSIS — E876 Hypokalemia: Secondary | ICD-10-CM | POA: Diagnosis not present

## 2013-12-03 DIAGNOSIS — M869 Osteomyelitis, unspecified: Secondary | ICD-10-CM | POA: Diagnosis present

## 2013-12-03 DIAGNOSIS — E785 Hyperlipidemia, unspecified: Secondary | ICD-10-CM

## 2013-12-03 DIAGNOSIS — E1139 Type 2 diabetes mellitus with other diabetic ophthalmic complication: Secondary | ICD-10-CM | POA: Diagnosis present

## 2013-12-03 DIAGNOSIS — L97809 Non-pressure chronic ulcer of other part of unspecified lower leg with unspecified severity: Secondary | ICD-10-CM | POA: Diagnosis present

## 2013-12-03 HISTORY — DX: Reserved for concepts with insufficient information to code with codable children: IMO0002

## 2013-12-03 HISTORY — DX: Cocaine use, unspecified, in remission: F14.91

## 2013-12-03 HISTORY — DX: Alcohol abuse, in remission: F10.11

## 2013-12-03 HISTORY — DX: Type 2 diabetes mellitus with hyperglycemia: E11.65

## 2013-12-03 HISTORY — DX: Personal history of other specified conditions: Z87.898

## 2013-12-03 HISTORY — DX: Paroxysmal atrial fibrillation: I48.0

## 2013-12-03 HISTORY — DX: Tobacco use: Z72.0

## 2013-12-03 LAB — BASIC METABOLIC PANEL
BUN: 31 mg/dL — ABNORMAL HIGH (ref 6–23)
CALCIUM: 10.6 mg/dL — AB (ref 8.4–10.5)
CO2: 26 meq/L (ref 19–32)
CREATININE: 1.23 mg/dL (ref 0.50–1.35)
Chloride: 87 mEq/L — ABNORMAL LOW (ref 96–112)
GFR calc Af Amer: 75 mL/min — ABNORMAL LOW (ref >90)
GFR calc non Af Amer: 64 mL/min — ABNORMAL LOW (ref >90)
GLUCOSE: 226 mg/dL — AB (ref 70–99)
Potassium: 3.8 mEq/L (ref 3.7–5.3)
Sodium: 130 mEq/L — ABNORMAL LOW (ref 137–147)

## 2013-12-03 LAB — CBC WITH DIFFERENTIAL/PLATELET
Basophils Absolute: 0 10*3/uL (ref 0.0–0.1)
Basophils Relative: 0 % (ref 0–1)
EOS PCT: 1 % (ref 0–5)
Eosinophils Absolute: 0.1 10*3/uL (ref 0.0–0.7)
HEMATOCRIT: 41.7 % (ref 39.0–52.0)
Hemoglobin: 14.6 g/dL (ref 13.0–17.0)
LYMPHS ABS: 2.9 10*3/uL (ref 0.7–4.0)
LYMPHS PCT: 20 % (ref 12–46)
MCH: 29.6 pg (ref 26.0–34.0)
MCHC: 35 g/dL (ref 30.0–36.0)
MCV: 84.4 fL (ref 78.0–100.0)
MONO ABS: 0.9 10*3/uL (ref 0.1–1.0)
Monocytes Relative: 7 % (ref 3–12)
Neutro Abs: 10.5 10*3/uL — ABNORMAL HIGH (ref 1.7–7.7)
Neutrophils Relative %: 72 % (ref 43–77)
Platelets: 404 10*3/uL — ABNORMAL HIGH (ref 150–400)
RBC: 4.94 MIL/uL (ref 4.22–5.81)
RDW: 14 % (ref 11.5–15.5)
WBC: 14.5 10*3/uL — AB (ref 4.0–10.5)

## 2013-12-03 LAB — I-STAT CG4 LACTIC ACID, ED: Lactic Acid, Venous: 1.89 mmol/L (ref 0.5–2.2)

## 2013-12-03 LAB — TROPONIN I: Troponin I: <0.3 ng/mL (ref <0.30)

## 2013-12-03 MED ORDER — INSULIN GLARGINE 100 UNIT/ML ~~LOC~~ SOLN: 30.0000 [IU] | Freq: Every day | SUBCUTANEOUS | Status: DC

## 2013-12-03 MED ORDER — GABAPENTIN 300 MG PO CAPS
300.0000 mg | ORAL_CAPSULE | Freq: Two times a day (BID) | ORAL | Status: DC
  Administered 2013-12-04 – 2013-12-09 (×12): 300 mg via ORAL
  Filled 2013-12-03 (×15): qty 1

## 2013-12-03 MED ORDER — CLINDAMYCIN PHOSPHATE 600 MG/50ML IV SOLN
600.0000 mg | Freq: Three times a day (TID) | INTRAVENOUS | Status: DC
  Administered 2013-12-04 – 2013-12-06 (×8): 600 mg via INTRAVENOUS
  Filled 2013-12-03 (×11): qty 50

## 2013-12-03 MED ORDER — NICOTINE 14 MG/24HR TD PT24
14.0000 mg | MEDICATED_PATCH | Freq: Every day | TRANSDERMAL | Status: DC
  Administered 2013-12-04 – 2013-12-09 (×6): 14 mg via TRANSDERMAL
  Filled 2013-12-03 (×6): qty 1

## 2013-12-03 MED ORDER — GABAPENTIN 600 MG PO TABS
600.0000 mg | ORAL_TABLET | Freq: Every day | ORAL | Status: DC
  Administered 2013-12-04 – 2013-12-08 (×6): 600 mg via ORAL
  Filled 2013-12-03 (×7): qty 1

## 2013-12-03 MED ORDER — PIPERACILLIN-TAZOBACTAM 3.375 G IVPB
3.3750 g | Freq: Three times a day (TID) | INTRAVENOUS | Status: DC
  Administered 2013-12-04 (×2): 3.375 g via INTRAVENOUS
  Filled 2013-12-03 (×4): qty 50

## 2013-12-03 MED ORDER — HEPARIN (PORCINE) IN NACL 100-0.45 UNIT/ML-% IJ SOLN
1250.0000 [IU]/h | INTRAMUSCULAR | Status: DC
  Filled 2013-12-03: qty 250

## 2013-12-03 MED ORDER — INSULIN ASPART 100 UNIT/ML ~~LOC~~ SOLN
0.0000 [IU] | Freq: Three times a day (TID) | SUBCUTANEOUS | Status: DC
  Administered 2013-12-04 – 2013-12-05 (×2): 3 [IU] via SUBCUTANEOUS
  Administered 2013-12-05: 5 [IU] via SUBCUTANEOUS
  Administered 2013-12-05: 15 [IU] via SUBCUTANEOUS
  Administered 2013-12-06: 2 [IU] via SUBCUTANEOUS
  Administered 2013-12-07: 5 [IU] via SUBCUTANEOUS
  Administered 2013-12-07: 3 [IU] via SUBCUTANEOUS
  Administered 2013-12-08: 5 [IU] via SUBCUTANEOUS
  Administered 2013-12-08: 8 [IU] via SUBCUTANEOUS
  Administered 2013-12-09: 3 [IU] via SUBCUTANEOUS

## 2013-12-03 MED ORDER — SODIUM CHLORIDE 0.9 % IV BOLUS (SEPSIS)
1000.0000 mL | Freq: Once | INTRAVENOUS | Status: AC
  Administered 2013-12-03: 1000 mL via INTRAVENOUS

## 2013-12-03 MED ORDER — FENTANYL CITRATE 0.05 MG/ML IJ SOLN
50.0000 ug | Freq: Once | INTRAMUSCULAR | Status: AC
  Administered 2013-12-03: 50 ug via INTRAVENOUS
  Filled 2013-12-03: qty 2

## 2013-12-03 MED ORDER — ONDANSETRON HCL 4 MG PO TABS: 4.0000 mg | ORAL_TABLET | Freq: Four times a day (QID) | ORAL | Status: DC | PRN

## 2013-12-03 MED ORDER — ATORVASTATIN CALCIUM 40 MG PO TABS
40.0000 mg | ORAL_TABLET | Freq: Every day | ORAL | Status: DC
  Administered 2013-12-04 – 2013-12-09 (×6): 40 mg via ORAL
  Filled 2013-12-03 (×6): qty 1

## 2013-12-03 MED ORDER — GABAPENTIN 300 MG PO CAPS
300.0000 mg | ORAL_CAPSULE | Freq: Three times a day (TID) | ORAL | Status: DC
  Filled 2013-12-03: qty 2

## 2013-12-03 MED ORDER — FENTANYL CITRATE 0.05 MG/ML IJ SOLN
25.0000 ug | INTRAMUSCULAR | Status: DC | PRN
  Administered 2013-12-03 – 2013-12-05 (×10): 50 ug via INTRAVENOUS
  Filled 2013-12-03 (×10): qty 2

## 2013-12-03 MED ORDER — INSULIN GLARGINE 100 UNIT/ML ~~LOC~~ SOLN
15.0000 [IU] | Freq: Every day | SUBCUTANEOUS | Status: DC
  Administered 2013-12-04: 15 [IU] via SUBCUTANEOUS
  Filled 2013-12-03 (×3): qty 0.15

## 2013-12-03 MED ORDER — ACETAMINOPHEN 650 MG RE SUPP: 650.0000 mg | Freq: Four times a day (QID) | RECTAL | Status: DC | PRN

## 2013-12-03 MED ORDER — INSULIN ASPART 100 UNIT/ML ~~LOC~~ SOLN
0.0000 [IU] | Freq: Every day | SUBCUTANEOUS | Status: DC
  Administered 2013-12-03: 2 [IU] via SUBCUTANEOUS
  Administered 2013-12-04: 3 [IU] via SUBCUTANEOUS
  Administered 2013-12-06: 2 [IU] via SUBCUTANEOUS
  Administered 2013-12-07: 4 [IU] via SUBCUTANEOUS

## 2013-12-03 MED ORDER — SODIUM CHLORIDE 0.9 % IJ SOLN
3.0000 mL | Freq: Two times a day (BID) | INTRAMUSCULAR | Status: DC
  Administered 2013-12-04 – 2013-12-09 (×8): 3 mL via INTRAVENOUS

## 2013-12-03 MED ORDER — HEPARIN BOLUS VIA INFUSION
5000.0000 [IU] | Freq: Once | INTRAVENOUS | Status: DC
  Filled 2013-12-03: qty 5000

## 2013-12-03 MED ORDER — PIPERACILLIN-TAZOBACTAM 4.5 G IVPB
4.5000 g | Freq: Once | INTRAVENOUS | Status: AC
  Administered 2013-12-03: 4.5 g via INTRAVENOUS
  Filled 2013-12-03 (×2): qty 100

## 2013-12-03 MED ORDER — SODIUM CHLORIDE 0.9 % IV SOLN: INTRAVENOUS | Status: DC

## 2013-12-03 MED ORDER — VANCOMYCIN HCL IN DEXTROSE 1-5 GM/200ML-% IV SOLN
1000.0000 mg | Freq: Three times a day (TID) | INTRAVENOUS | Status: DC
  Administered 2013-12-03 – 2013-12-06 (×8): 1000 mg via INTRAVENOUS
  Filled 2013-12-03 (×11): qty 200

## 2013-12-03 MED ORDER — ALBUTEROL SULFATE (2.5 MG/3ML) 0.083% IN NEBU: 2.5000 mg | INHALATION_SOLUTION | RESPIRATORY_TRACT | Status: DC | PRN

## 2013-12-03 MED ORDER — PANTOPRAZOLE SODIUM 40 MG IV SOLR
40.0000 mg | INTRAVENOUS | Status: DC
  Administered 2013-12-04 (×2): 40 mg via INTRAVENOUS
  Filled 2013-12-03 (×4): qty 40

## 2013-12-03 MED ORDER — ACETAMINOPHEN 325 MG PO TABS: 650.0000 mg | ORAL_TABLET | Freq: Four times a day (QID) | ORAL | Status: DC | PRN

## 2013-12-03 MED ORDER — ONDANSETRON HCL 4 MG/2ML IJ SOLN: 4.0000 mg | Freq: Four times a day (QID) | INTRAMUSCULAR | Status: DC | PRN

## 2013-12-03 MED ORDER — PIPERACILLIN-TAZOBACTAM 3.375 G IVPB: 3.3750 g | Freq: Three times a day (TID) | INTRAVENOUS | Status: DC

## 2013-12-03 NOTE — ED Notes (Addendum)
PA at the bedside. See PA note for wound assessment.

## 2013-12-03 NOTE — Progress Notes (Signed)
ANTICOAGULATION CONSULT NOTE - Initial Consult  Pharmacy Consult for Heparin  Indication: atrial fibrillation, new onset  No Known Allergies  Patient Measurements: Height: 6\' 4"  (193 cm) Weight: 211 lb 6.7 oz (95.9 kg) IBW/kg (Calculated) : 86.8  Vital Signs: Temp: 98.5 F (36.9 C) (06/24 2251) Temp src: Oral (06/24 2251) BP: 136/74 mmHg (06/24 2251) Pulse Rate: 88 (06/24 2251)  Labs:  Recent Labs  12/03/13 1701  HGB 14.6  HCT 41.7  PLT 404*  CREATININE 1.23  TROPONINI <0.30    Estimated Creatinine Clearance: 83.3 ml/min (by C-G formula based on Cr of 1.23).   Medical History: Past Medical History  Diagnosis Date  . DM (diabetes mellitus)   . HTN (hypertension)   . History of DVT (deep vein thrombosis)   . Personal history of diabetic foot ulcer   . Peripheral vascular disease   . Peripheral neuropathy 11/19/2011  . Personal history of diabetic foot ulcer   . Insomnia   . Sinus congestion   . Varicose vein     legs  . Osteomyelitis of foot, right, acute 08/30/2012  . Personal history of colonic adenomas 05/29/2013    Assessment: 53 y/o M with new onset afib likely in setting of sepsis/infection to start heparin per pharmacy. CBC/renal function ok, other labs as above.   Goal of Therapy:  Heparin level 0.3-0.7 units/ml Monitor platelets by anticoagulation protocol: Yes   Plan:  -Heparin 5000 units BOLUS -Start heparin drip at 1250 units/hr -0600 HL -Daily CBC/HL -Monitor for bleeding  Narda Bonds 12/03/2013,10:59 PM

## 2013-12-03 NOTE — ED Notes (Signed)
Phlebotomy at the bedside obtaining second cultures and blood.

## 2013-12-03 NOTE — ED Notes (Signed)
Per EMS, Patient has a sore on his right foot that "popped" yesterday and started to drain out a purulent discharge with fourl odor. Patient is had surgery on the left foot two weeks ago and the wound is healing properly. 132/80, 110 Irregular, 18 RR, 99 % on RA.

## 2013-12-03 NOTE — ED Provider Notes (Signed)
Medical screening examination/treatment/procedure(s) were conducted as a shared visit with non-physician practitioner(s) and myself.  I personally evaluated the patient during the encounter.   EKG Interpretation None       Results for orders placed during the hospital encounter of 12/03/13  CBC WITH DIFFERENTIAL      Result Value Ref Range   WBC 14.5 (*) 4.0 - 10.5 K/uL   RBC 4.94  4.22 - 5.81 MIL/uL   Hemoglobin 14.6  13.0 - 17.0 g/dL   HCT 41.7  39.0 - 52.0 %   MCV 84.4  78.0 - 100.0 fL   MCH 29.6  26.0 - 34.0 pg   MCHC 35.0  30.0 - 36.0 g/dL   RDW 14.0  11.5 - 15.5 %   Platelets 404 (*) 150 - 400 K/uL   Neutrophils Relative % 72  43 - 77 %   Neutro Abs 10.5 (*) 1.7 - 7.7 K/uL   Lymphocytes Relative 20  12 - 46 %   Lymphs Abs 2.9  0.7 - 4.0 K/uL   Monocytes Relative 7  3 - 12 %   Monocytes Absolute 0.9  0.1 - 1.0 K/uL   Eosinophils Relative 1  0 - 5 %   Eosinophils Absolute 0.1  0.0 - 0.7 K/uL   Basophils Relative 0  0 - 1 %   Basophils Absolute 0.0  0.0 - 0.1 K/uL  BASIC METABOLIC PANEL      Result Value Ref Range   Sodium 130 (*) 137 - 147 mEq/L   Potassium 3.8  3.7 - 5.3 mEq/L   Chloride 87 (*) 96 - 112 mEq/L   CO2 26  19 - 32 mEq/L   Glucose, Bld 226 (*) 70 - 99 mg/dL   BUN 31 (*) 6 - 23 mg/dL   Creatinine, Ser 1.23  0.50 - 1.35 mg/dL   Calcium 10.6 (*) 8.4 - 10.5 mg/dL   GFR calc non Af Amer 64 (*) >90 mL/min   GFR calc Af Amer 75 (*) >90 mL/min  TROPONIN I      Result Value Ref Range   Troponin I <0.30  <0.30 ng/mL  I-STAT CG4 LACTIC ACID, ED      Result Value Ref Range   Lactic Acid, Venous 1.89  0.5 - 2.2 mmol/L   Dg Chest 2 View  12/03/2013   CLINICAL DATA:  chest pain  EXAM: CHEST  2 VIEW  COMPARISON:  Two-view chest radiograph 11/21/2013  FINDINGS: Low lung volumes. The heart size and mediastinal contours are within normal limits. Both lungs are clear. The visualized skeletal structures are unremarkable.  IMPRESSION: No active cardiopulmonary disease.    Electronically Signed   By: Margaree Mackintosh M.D.   On: 12/03/2013 18:26   Dg Chest 2 View  11/21/2013   CLINICAL DATA:  Osteomyelitis left great toe.  Diabetes.  EXAM: CHEST  2 VIEW  COMPARISON:  04/24/2012  FINDINGS: Heart is normal size. No confluent airspace opacities or effusions. No acute bony abnormality.  IMPRESSION: No active disease.   Electronically Signed   By: Rolm Baptise M.D.   On: 11/21/2013 12:57   Dg Foot Complete Right  12/03/2013   CLINICAL DATA:  Pain and swelling.  EXAM: RIGHT FOOT COMPLETE - 3+ VIEW  COMPARISON:  Radiographs 08/30/2012 and MRI 03/07/2013.  FINDINGS: Diffuse hindfoot soft tissue swelling and air in the soft tissues worrisome for infection (gas gangrene). There is an open wound on the lateral aspect of the foot near the base  of the fifth metatarsal. Findings suspicious for osteomyelitis.  IMPRESSION: Diffuse hindfoot swelling and gas in the soft tissues worrisome for infection (gas gangrene).  Open wound near the base of the fifth metatarsal with underlying osteomyelitis.   Electronically Signed   By: Kalman Jewels M.D.   On: 12/03/2013 18:25    CRITICAL CARE Performed by: Fredia Sorrow Total critical care time: 30 Critical care time was exclusive of separately billable procedures and treating other patients. Critical care was necessary to treat or prevent imminent or life-threatening deterioration. Critical care was time spent personally by me on the following activities: development of treatment plan with patient and/or surrogate as well as nursing, discussions with consultants, evaluation of patient's response to treatment, examination of patient, obtaining history from patient or surrogate, ordering and performing treatments and interventions, ordering and review of laboratory studies, ordering and review of radiographic studies, pulse oximetry and re-evaluation of patient's condition.  Patient with history of diabetes. Recently had some toe amputations of  the left foot by Dr. Sharol Given. Patient presents today with open wound to the right foot was some crepitus clinically consistent with some gas gangrene. X-ray confirms that. Patient also arrived hypotensive with blood pressures in the upper T. Patient treated as septic. Cultures done patient started on Zosyn and vancomycin. Patient given 1 L of fluid blood pressure now up to the 300 systolic. Patient also with an atrial for Bill patient pattern. Heart rate up to as high as 150s. Mostly around 120s. Wasn't clear whether this related all to the A. fib or partly due to the sepsis. Due to low blood pressures that was not addressed. Patient initially addressed with fluids. Patient will require admission.  Patient alert and cooperative mentating fine.  Fredia Sorrow, MD 12/03/13 [phone]

## 2013-12-03 NOTE — H&P (Signed)
[facility] Admission History and Physical Service Pager: [phone]  Patient name: Steven [name] Medical record number: [id number] Date of birth: 04-25-1961 Age: 53 y.o. Gender: male  Primary Care [place]: Chrisandra Netters, MD Consultants: Orthopedics, Cardiology Code Status: Full  Chief Complaint: infected Left foot ulcers  Assessment and Plan: MARCH STEYER is a 53 y.o. male presenting with severe sepsis due to worsening infected Left-foot DM ulcers, found to have osteomyelitis and gas-gangrene, additionally found to be in new onset AFib with RVR. PMH is significant for DM-2 (poorly controlled, complicated peripheral neuropathy), multiple bilateral toe amputations (Dr. Sharol Given) with h/o osteomyelitis, HTN, PVD.  # Severe Sepsis, with hypotension, secondary to osteomyelitis from R-foot infected ulcers - Presented with sepsis criteria: tachy 140-150s, WBC 14.9, additionally with hypotension 88-90s/50s, some response to IVF resuscitation with 3L NS bolus. Otherwise, no evidence of end organ damage initially, Troponin (negative x 1), lactic acid (1.89, nml) - Admit to FPTS, SDU status, vitals per protocol - Currently with normal mental status, improving BP, afebrile - s/p 2L NS bolus in ED, ordered 3rd 1L bolus - Continue IVF resuscitation with NS @ 150 cc/hr - Ordered Cortisol in AM, concern with frequent hospitalizations, acute illness may be low leading to hypotension with poor physiologic response.  If low, would dose with Hydrocortisone q 6 hrs - Close monitoring of BP for response to IVF. If worsening of hypotension or other clinical concerns, low threshold to consult PCCM for evaluation and potential admission if concern for septic shock  # Osteomyelitis with gas gangrene, from worsening R-foot DM ulcers - Followed by Dr. Sharol Given (Orthopedics). Significant history multiple toe amputations bilaterally with h/o osteomyelitis. - In ED, R-foot X-ray with gas  gangrene and osteomyelitis. Clinical concern for extension of osteo into ankle with tenderness, chronic ulcers seem acutely worse with foul drainage, concern that pt may need extensive amputation - Follow-up Blood culture x 2 (collected 6/24) - Continue Vancomycin and Zosyn IV per pharm, addition of Clindamycin for toxin control and coverage of C. perfringens  - Pain control: Fentanyl 25-60mg IV q 2 hr PRN, Tylenol, Gabapentin (home) - cautious with hypotension - Elevated WBC 14.5, f/u CBC in AM - Ordered ESR, CRP - Suspect will require MRI RLE for determining extension of infection - NPO at this time (pending possible surgery, follow-up with Ortho) - Consult Orthopedic Surgery - greatly appreciate recommendations / management  # Atrial Fibrillation with RVR, Paroxysmal, unknown duration - No significant known history of Atrial Fibrillation or any cardiac history. Known PVD, with additional risk factors (DM2) - In ED, with atrial fibrillation (unknown onset), with RVR up to 140-150s, some improvement s/p IVF.  With his low pressures, would not start on CCB/BB and would consider amiodraone/digoxin.  However, unsure if his hypotension is caused by severe sepsis or Afib.   - Started Heparin gtt, therapeutic for anticoagulation - (CHADsVASC score 3) - Consult Cardiology - greatly appreciate recommendations / management  # Acute Kidney Injury - Baseline Cr 0.8-1.00, with nml GFR, no history of chronic kidney disease per review - Elevated Cr 1.23 on admission, also elevated BUN 31, with BUN:Cr of >25, suggestive of pre-renal hypovolemia - Continue IVF rehydration, NS @ 150 cc/hr, s/p 3L - Hold ACEi, nephrotoxic agents  # DM, Type 2, poorly controlled - Last HgbA1c >14.0 (09/18/13) - Resume home Lantus 30u tonight, titrate PRN - Moderate SSI - CBG q 6 hr, while NPO - In setting of acute infection, anticipate increase in  Insulin usage  # Hyponatremia, suspected hypovolemia / dehydration and  dietary  - Na 130 on admission, h/o chronic low Na 130-132 - IVF rehydration - Monitor BMET  # HTN - Currently hypotensive, with concerns of severe sepsis - IVF resuscitation with IV boluses - Hold home anti-HTN agents (Lisinopril - HCTZ)  # HLD - Resume home statin starting tomorrow  # Tobacco abuse - Active smoker 0.5ppd, >30 yrs - Nicotine patch 57m daily  FEN/GI: s/p 3L IV bolus, NS @ 150 cc/hrr / Protonix IV Prophylaxis: therapeutic Heparin gtt per pharmacy (AFib)  Disposition: Admitted to FPTS, SDU status, severe sepsis with R-foot DM ulcers with osteomyelitis / gas gangrene, new onset AFib RVR, consulted Cardiology and Orthopedics, pending further evaluation, anticipate surgery, continue IV antibiotics. Pending clinical improvement  History of Present Illness:   Steven BACCHIis a 53y.o. male presenting with right foot DM ulcer, complains of worsening chronic skin ulcers on sides of Left foot for several days, recently with boil formation over past 2 days. Reports recent hospitalization by Orthopedics (Dr. DSharol Given for Left toe amputation (11/21/13), discharged home on Doxycyline PO course. Admits compliance with antibiotics, however R-foot infection seemed to get worse, "boil busted yesterday" after soaking in warm water, reported foul smelling drainage. Home health RN advised to come in to hospital, presented to ED today. Complains of generalized pain in R-foot across foot near both ulcers and up ankle, otherwise denies pain in lower legs or left foot. Admits to being compliant with insulin and other medications. States he is eating / drinking well, did have diarrhea x 1 episode yesterday, otherwise no other complaints.  Admits to sweats (no fever or chills). Denies CP, SOB, palpitations, diarrhea or bloody stools.  Review Of Systems: Per HPI with the following additions: none Otherwise 12 point review of systems was performed and was unremarkable.  Patient Active Problem List    Diagnosis Date Noted  . Osteomyelitis of ankle or foot, acute 11/21/2013  . Gangrene of foot 11/21/2013  . 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 Personal history of colonic adenomas 05/29/2013  . Diabetic retinopathy 12/25/2012  . HLD (hyperlipidemia) 12/08/2012  . Tobacco abuse counseling 09/26/2012  . Osteomyelitis of foot 08/30/2012  . Peripheral neuropathy 11/19/2011  . Sleep disturbance 08/07/2011  . ETOH abuse 03/29/2011  . Shoulder pain 03/23/2011  . HTN (hypertension) 12/22/2010  . ONYCHOMYCOSIS, TOENAILS 07/18/2006  . Diabetes mellitus type II, uncontrolled 07/18/2006  . VENOUS INSUFFICIENCY 07/18/2006  . CALLUSES, FEET, BILATERAL 07/18/2006  . HX, PERSONAL, HEALTH HAZARDS [place]/11/2006   Past Medical History: Past Medical History  Diagnosis Date  . DM (diabetes mellitus)   . HTN (hypertension)   . History of DVT (deep vein thrombosis)   . Personal history of diabetic foot ulcer   . Peripheral vascular disease   . Peripheral neuropathy 11/19/2011  . Personal history of diabetic foot ulcer   . Insomnia   . Sinus congestion   . Varicose vein     legs  . Osteomyelitis of foot, right, acute 08/30/2012  . Personal history of colonic adenomas 05/29/2013   Past Surgical History: Past Surgical History  Procedure Laterality Date  . Amputation  04/21/2011    Procedure: AMPUTATION RAY;  Surgeon: MNewt Minion MD;  [place]  Service: [place]opedics;  Laterality: Right;  Rt foot 2nd ray ampt  . I&d extremity  05/03/2011    Procedure: IRRIGATION AND DEBRIDEMENT EXTREMITY;  Surgeon: MNewt Minion MD;  [facility];  Service: Orthopedics;  Laterality: Right;  Irrigation and Debridement Right Foot and Place Acell Xenograft  . Toe amputation  04/24/2012    great toe   right foot  . Amputation  04/24/2012    Procedure: AMPUTATION RAY;  Surgeon: Newt Minion, MD;  Location: [place];  Service: Orthopedics;  Laterality: Right;  Right foot 1st ray amputation  . Amputation  Left 04/23/2013    Procedure: AMPUTATION RAY;  Surgeon: Newt Minion, MD;  Location: [place];  Service: Orthopedics;  Laterality: Left;  Left Foot 5th Ray Amputation  . Colonoscopy    . Amputation Left 11/21/2013    Procedure: AMPUTATION RAY;  Surgeon: Newt Minion, MD;  Location: [place];  Service: Orthopedics;  Laterality: Left;  Left Foot 1st & 2nd Ray Amputation   Social History: History  Substance Use Topics  . Smoking status: Current Every Day Smoker -- 0.50 packs/day for 30 years    Types: Cigarettes  . Smokeless tobacco: Never Used  . Alcohol Use: 2.5 - 3.0 oz/week    5-6 drink(s) per week     Comment: t states he hs rank any alcholol rcently.  drinks off and on   Additional social history: - Lives home alone, with home RN x3 weekly - Wheelchair for mobility - Smoking 0.5ppd active, 30 years (request nicotine patch) - Occasional EtOH, h/o EtOH abuse   Please also refer to relevant sections of EMR.  Family History: Family History  Problem Relation Age of Onset  . Diabetes Mother   . Stroke Brother   . Colon cancer Neg Hx    Allergies and Medications: No Known Allergies No current facility-administered medications on file prior to encounter.   Current Outpatient Prescriptions on File Prior to Encounter  Medication Sig Dispense Refill  . albuterol (PROVENTIL HFA;VENTOLIN HFA) 108 (90 BASE) MCG/ACT inhaler Inhale 2 puffs into the lungs every 6 (six) hours as needed for wheezing or shortness of breath.  1 Inhaler  0  . atorvastatin (LIPITOR) 40 MG tablet Take 1 tablet (40 mg total) by mouth daily.  90 tablet  0  . cetirizine (ZYRTEC) 10 MG tablet Take 1 tablet (10 mg total) by mouth daily.  30 tablet  5  . doxycycline (VIBRAMYCIN) 100 MG capsule Take 100 mg by mouth 2 (two) times daily.      . fluticasone (FLONASE) 50 MCG/ACT nasal spray Place 2 sprays into both nostrils daily.  16 g  1  . gabapentin (NEURONTIN) 300 MG capsule Take 300-600 mg by mouth 3 (three) times daily.  Take 31m in the morning, take 3027mat lunch, and take 60069mt bedtime.      . hydrocortisone cream 1 % Apply 1 application topically 2 (two) times daily as needed for itching.      . insulin glargine (LANTUS) 100 UNIT/ML injection Inject 30 Units into the skin at bedtime.      . lMarland Kitchensinopril-hydrochlorothiazide (PRINZIDE,ZESTORETIC) 20-25 MG per tablet Take 1 tablet by mouth daily.  90 tablet  0    Objective: BP 105/76  Pulse 156  Temp(Src) 98.3 F (36.8 C) (Oral)  Resp 25  Ht 6' 0.05" (1.83 m)  Wt 207 lb 10.8 oz (94.2 kg)  BMI 28.13 kg/m2  SpO2 100% Exam: General: laying in bed, talking on phone initially, uncomfortable due to L-foot pain, otherwise cooperative, NAD  HEENT: NCAT, PERRL, EOMI, oropharynx clear, mild dry MM Neck: No JVD Cardiovascular: tachycardic, irregularly irregular, no murmurs Respiratory: Mostly CTAB,  without significant wheezing, crackles, or rhonchi. Normal resp effort, speaks in full sentences. Abdomen: soft, NTND, +active BS Extremities: bilateral feet s/p multiple toe amputations / revisions, recent L-foot toe amputation with recent stitches in place, R-Foot: x 2 large open ulcers (medial & lateral) aspect of ankle, foul smelling drainage, generalized tenderness to touch up to high ankle. Bilateral dorsalis pedis pulses +1 intact, no edema, symmetrical Skin: warm, dry, ulcers as above Neuro: awake, alert, oriented, grossly non-focal, grip strength 5/5 bilaterally, lower ext strength not fully tested due to pain. Gait not tested due to acute foot infection  Labs and Imaging: CBC BMET   Recent Labs Lab 12/03/13 1701  WBC 14.5*  HGB 14.6  HCT 41.7  PLT 404*    Recent Labs Lab 12/03/13 1701  NA 130*  K 3.8  CL 87*  CO2 26  BUN 31*  CREATININE 1.23  GLUCOSE 226*  CALCIUM 10.6*     Lactic Acid - 1.89 Troponin - negative x1  ESR - ordered CRP - ordered TSH - ordered Cortisol - ordered in AM HgbA1c - ordered  Blood Cultures (x 2,  collected 12/03/13)  6/24 CXR 2v FINDINGS:  Low lung volumes. The heart size and mediastinal contours are within  normal limits. Both lungs are clear. The visualized skeletal  structures are unremarkable.  IMPRESSION:  No active cardiopulmonary disease.  6/24 Right Foot, complete IMPRESSION:  Diffuse hindfoot swelling and gas in the soft tissues worrisome for  infection (gas gangrene).  Open wound near the base of the fifth metatarsal with underlying  osteomyelitis.  [name], DO 12/03/2013, 7:11 PM PGY-1, [place] Intern pager: [phone], text pages welcome   I have seen and evaluated the pt with Dr. Parks Ranger, please see his excellent H&P for further information.  My additions are in red.  Tamela Oddi Awanda Mink, DO of [facility] 12/03/2013, 8:50 PM

## 2013-12-03 NOTE — ED Notes (Signed)
Patient still away at [place].

## 2013-12-03 NOTE — ED Notes (Signed)
Patient presents with both feet wrapped up with gauze. Patient states he has pain 9/10 in the right foot.

## 2013-12-03 NOTE — Progress Notes (Signed)
ANTIBIOTIC CONSULT NOTE - INITIAL  Pharmacy Consult for Zosyn and Vancomycin Indication: Osteomyelitis  No Known Allergies  Patient Measurements: Height: 6' 0.05" (183 cm) Weight: 207 lb 10.8 oz (94.2 kg) IBW/kg (Calculated) : 77.71 Adjusted Body Weight:  Vital Signs: Temp: 98.3 F (36.8 C) (06/24 1603) Temp src: Oral (06/24 1603) BP: 93/79 mmHg (06/24 1930) Pulse Rate: 26 (06/24 1930) Intake/Output from previous day:   Intake/Output from this shift: Total I/O In: 1150 [I.V.:1150] Out: -   Labs:  Recent Labs  12/03/13 1701  WBC 14.5*  HGB 14.6  PLT 404*  CREATININE 1.23   Estimated Creatinine Clearance: 80.9 ml/min (by C-G formula based on Cr of 1.23). No results found for this basename: VANCOTROUGH, VANCOPEAK, VANCORANDOM, GENTTROUGH, GENTPEAK, GENTRANDOM, TOBRATROUGH, TOBRAPEAK, TOBRARND, AMIKACINPEAK, AMIKACINTROU, AMIKACIN,  in the last 72 hours   Microbiology: No results found for this or any previous visit (from the past 720 hour(s)).  Medical History: Past Medical History  Diagnosis Date  . DM (diabetes mellitus)   . HTN (hypertension)   . History of DVT (deep vein thrombosis)   . Personal history of diabetic foot ulcer   . Peripheral vascular disease   . Peripheral neuropathy 11/19/2011  . Personal history of diabetic foot ulcer   . Insomnia   . Sinus congestion   . Varicose vein     legs  . Osteomyelitis of foot, right, acute 08/30/2012  . Personal history of colonic adenomas 05/29/2013    Medications:   Assessment: 53 yr old pt with DM and recent amputation of  some toes on his foot that weren't healing and appeared worrisome for gangrene. Other toes were suspicious for osteomyelitis. The pt also was hypotensive and in afib on arrival. Zosyn and Vanc were ordered per pharmacy.     Goal of Therapy:  Vancomycin trough level 15-20 mcg/ml  Plan: Vancomycin 1 Gm IV q8h Levels when appropriate Zosyn 4.5 Gm initially, then 3.375 Gm IV q8h.      Minta Balsam 12/03/2013,8:18 PM

## 2013-12-03 NOTE — ED Notes (Addendum)
Margreta Journey, RN at the bedside. Attempting IV access.

## 2013-12-03 NOTE — Consult Note (Signed)
WOC wound consult note Reason for Consult:Right foot ulcers Wound type:Infected neuropathic, non-healing  Noted that ortho has already seen patient (he is a long-standing patient of Dr. Eather Colas) and patient is scheduled for amputation tomorrow.  No need for [place] Nurse to see.   [place] nursing team will not follow, but will remain available to this patient, the nursing and medical team.  Please re-consult if needed. Thanks, Maudie Flakes, MSN, RN, [place], [place], [place] [phone])

## 2013-12-03 NOTE — ED Provider Notes (Signed)
CSN: [id number]     Arrival date & time 12/03/13  1554 History   First MD Initiated Contact with Patient 12/03/13 1601     Chief Complaint  Patient presents with  . Recurrent Skin Infections   HPI  Steven [name] is a 53 y.o. male with a PMH of osteomyelitis, diabetic foot ulcer, DVT, DM, HTN, PVD, and varicose veins who presents to the ED for evaluation of recurrent skin infections. History was provided by the patient. Patient has a sore on his right foot which "popped" yesterday, which has been draining a foul smelling purulent drainage. He denies any trauma. He has had severe pain to the right foot which is worse with weight bearing and movement. He has numbness and altered sensation at baseline. No fever, chills, change in appetite/activity, chest pain, SOB, abdominal pain, nausea, emesis, diarrhea, headache, dizziness or lightheadedness. Patient's orthopedic specialist is Dr. Sharol Given.    Past Medical History  Diagnosis Date  . DM (diabetes mellitus)   . HTN (hypertension)   . History of DVT (deep vein thrombosis)   . Personal history of diabetic foot ulcer   . Peripheral vascular disease   . Peripheral neuropathy 11/19/2011  . Personal history of diabetic foot ulcer   . Insomnia   . Sinus congestion   . Varicose vein     legs  . Osteomyelitis of foot, right, acute 08/30/2012  . Personal history of colonic adenomas 05/29/2013   Past Surgical History  Procedure Laterality Date  . Amputation  04/21/2011    Procedure: AMPUTATION RAY;  Surgeon: Newt Minion, MD;  Location: [place];  Service: Orthopedics;  Laterality: Right;  Rt foot 2nd ray ampt  . I&d extremity  05/03/2011    Procedure: IRRIGATION AND DEBRIDEMENT EXTREMITY;  Surgeon: Newt Minion, MD;  Location: [place];  Service: Orthopedics;  Laterality: Right;  Irrigation and Debridement Right Foot and Place Acell Xenograft  . Toe amputation  04/24/2012    great toe   right foot  . Amputation  04/24/2012    Procedure: AMPUTATION RAY;   Surgeon: Newt Minion, MD;  Location: [place];  Service: Orthopedics;  Laterality: Right;  Right foot 1st ray amputation  . Amputation Left 04/23/2013    Procedure: AMPUTATION RAY;  Surgeon: Newt Minion, MD;  Location: [place];  Service: Orthopedics;  Laterality: Left;  Left Foot 5th Ray Amputation  . Colonoscopy    . Amputation Left 11/21/2013    Procedure: AMPUTATION RAY;  Surgeon: Newt Minion, MD;  Location: [place];  Service: Orthopedics;  Laterality: Left;  Left Foot 1st & 2nd Ray Amputation   Family History  Problem Relation Age of Onset  . Diabetes Mother   . Stroke Brother   . Colon cancer Neg Hx    History  Substance Use Topics  . Smoking status: Current Every Day Smoker -- 0.50 packs/day for 30 years    Types: Cigarettes  . Smokeless tobacco: Never Used  . Alcohol Use: 2.5 - 3.0 oz/week    5-6 drink(s) per week     Comment: t states he hs rank any alcholol rcently.  drinks off and on    Review of Systems  Constitutional: Negative for fever, chills, activity change, appetite change and fatigue.  Respiratory: Negative for cough and shortness of breath.   Cardiovascular: Negative for chest pain.  Gastrointestinal: Negative for nausea, vomiting, abdominal pain and diarrhea.  Musculoskeletal: Positive for arthralgias, gait problem and myalgias.  Skin: Positive  for wound. Negative for color change.  Neurological: Positive for numbness. Negative for dizziness, weakness, light-headedness and headaches.    Allergies  Review of patient's allergies indicates no known allergies.  Home Medications   Prior to Admission medications   Medication Sig Start Date End Date Taking? Authorizing Rosslyn Pasion  albuterol (PROVENTIL HFA;VENTOLIN HFA) 108 (90 BASE) MCG/ACT inhaler Inhale 2 puffs into the lungs every 6 (six) hours as needed for wheezing or shortness of breath. 05/01/13  Yes Leeanne Rio, MD  atorvastatin (LIPITOR) 40 MG tablet Take 1 tablet (40 mg total) by mouth daily.  10/01/13  Yes Leeanne Rio, MD  cetirizine (ZYRTEC) 10 MG tablet Take 1 tablet (10 mg total) by mouth daily. 05/01/13  Yes Leeanne Rio, MD  doxycycline (VIBRAMYCIN) 100 MG capsule Take 100 mg by mouth 2 (two) times daily.   Yes Historical Carman Essick, MD  fluticasone (FLONASE) 50 MCG/ACT nasal spray Place 2 sprays into both nostrils daily. 05/01/13  Yes Leeanne Rio, MD  gabapentin (NEURONTIN) 300 MG capsule Take 300-600 mg by mouth 3 (three) times daily. Take 300mg  in the morning, take 300mg  at lunch, and take 600mg  at bedtime.   Yes Historical Martel Galvan, MD  hydrocortisone cream 1 % Apply 1 application topically 2 (two) times daily as needed for itching.   Yes Historical Apphia Cropley, MD  insulin glargine (LANTUS) 100 UNIT/ML injection Inject 30 Units into the skin at bedtime.   Yes Historical Kyran Connaughton, MD  lisinopril-hydrochlorothiazide (PRINZIDE,ZESTORETIC) 20-25 MG per tablet Take 1 tablet by mouth daily.   Yes Leeanne Rio, MD   BP 93/53  Pulse 55  Temp(Src) 98.3 F (36.8 C) (Oral)  Resp 16  SpO2 100%  Filed Vitals:   12/03/13 1600 12/03/13 1603 12/03/13 1630  BP: 93/53 93/53 88/59   Pulse: 88 55 78  Temp: 98.3 F (36.8 C) 98.3 F (36.8 C)   TempSrc: Oral Oral   Resp: 18 16 17   SpO2: 95% 100% 96%    Physical Exam  Nursing note and vitals reviewed. Constitutional: He is oriented to person, place, and time. He appears well-developed and well-nourished. No distress.  Non-toxic  HENT:  Head: Normocephalic and atraumatic.  Right Ear: External ear normal.  Left Ear: External ear normal.  Mouth/Throat: Oropharynx is clear and moist.  Eyes: Conjunctivae are normal. Right eye exhibits no discharge. Left eye exhibits no discharge.  Neck: Normal range of motion. Neck supple.  Cardiovascular: Exam reveals no gallop and no friction rub.   No murmur heard. Irregular rate and rhythm. Tachycardic. Dorsalis pedis pulses present and equal bilaterally  Pulmonary/Chest:  Effort normal and breath sounds normal. No respiratory distress. He has no wheezes. He has no rales. He exhibits no tenderness.  Abdominal: Soft. He exhibits no distension. There is no tenderness.  Musculoskeletal: Normal range of motion. He exhibits tenderness. He exhibits no edema.       Feet:  Diffuse tenderness to the right foot. Purulent drainage and foul odor from large heel ulcer on the medial right foot. Also small circular ulcers to the lateral right foot with purulent drainage.   Left foot with sutures to the medial foot. No wound dehiscence, edema, erythema, drainage, or tenderness.   Neurological: He is alert and oriented to person, place, and time.  Skin: Skin is warm and dry. He is not diaphoretic.    ED Course  Procedures (including critical care time) Labs Review Labs Reviewed  I-STAT CG4 LACTIC ACID, ED    Imaging Review  Dg Chest 2 View  12/03/2013   CLINICAL DATA:  chest pain  EXAM: CHEST  2 VIEW  COMPARISON:  Two-view chest radiograph 11/21/2013  FINDINGS: Low lung volumes. The heart size and mediastinal contours are within normal limits. Both lungs are clear. The visualized skeletal structures are unremarkable.  IMPRESSION: No active cardiopulmonary disease.   Electronically Signed   By: Margaree Mackintosh M.D.   On: 12/03/2013 18:26   Dg Foot Complete Right  12/03/2013   CLINICAL DATA:  Pain and swelling.  EXAM: RIGHT FOOT COMPLETE - 3+ VIEW  COMPARISON:  Radiographs 08/30/2012 and MRI 03/07/2013.  FINDINGS: Diffuse hindfoot soft tissue swelling and air in the soft tissues worrisome for infection (gas gangrene). There is an open wound on the lateral aspect of the foot near the base of the fifth metatarsal. Findings suspicious for osteomyelitis.  IMPRESSION: Diffuse hindfoot swelling and gas in the soft tissues worrisome for infection (gas gangrene).  Open wound near the base of the fifth metatarsal with underlying osteomyelitis.   Electronically Signed   By: Kalman Jewels  M.D.   On: 12/03/2013 18:25     EKG Interpretation None      Results for orders placed during the hospital encounter of 12/03/13  CBC WITH DIFFERENTIAL      Result Value Ref Range   WBC 14.5 (*) 4.0 - 10.5 K/uL   RBC 4.94  4.22 - 5.81 MIL/uL   Hemoglobin 14.6  13.0 - 17.0 g/dL   HCT 41.7  39.0 - 52.0 %   MCV 84.4  78.0 - 100.0 fL   MCH 29.6  26.0 - 34.0 pg   MCHC 35.0  30.0 - 36.0 g/dL   RDW 14.0  11.5 - 15.5 %   Platelets 404 (*) 150 - 400 K/uL   Neutrophils Relative % 72  43 - 77 %   Neutro Abs 10.5 (*) 1.7 - 7.7 K/uL   Lymphocytes Relative 20  12 - 46 %   Lymphs Abs 2.9  0.7 - 4.0 K/uL   Monocytes Relative 7  3 - 12 %   Monocytes Absolute 0.9  0.1 - 1.0 K/uL   Eosinophils Relative 1  0 - 5 %   Eosinophils Absolute 0.1  0.0 - 0.7 K/uL   Basophils Relative 0  0 - 1 %   Basophils Absolute 0.0  0.0 - 0.1 K/uL  BASIC METABOLIC PANEL      Result Value Ref Range   Sodium 130 (*) 137 - 147 mEq/L   Potassium 3.8  3.7 - 5.3 mEq/L   Chloride 87 (*) 96 - 112 mEq/L   CO2 26  19 - 32 mEq/L   Glucose, Bld 226 (*) 70 - 99 mg/dL   BUN 31 (*) 6 - 23 mg/dL   Creatinine, Ser 1.23  0.50 - 1.35 mg/dL   Calcium 10.6 (*) 8.4 - 10.5 mg/dL   GFR calc non Af Amer 64 (*) >90 mL/min   GFR calc Af Amer 75 (*) >90 mL/min  TROPONIN I      Result Value Ref Range   Troponin I <0.30  <0.30 ng/mL  I-STAT CG4 LACTIC ACID, ED      Result Value Ref Range   Lactic Acid, Venous 1.89  0.5 - 2.2 mmol/L     MDM   ALEXEI DOSWELL is a 53 y.o. male with a PMH of osteomyelitis, diabetic foot ulcer, DVT, DM, HTN, PVD, and varicose veins who presents to the ED for evaluation  of recurrent skin infections. Patient found to have osteomyelitis of the right foot with gangrene. Blood cultures drawn. Patient started on vancomycin and zosyn. Patient also likely septic with leukocytosis (14.5), hypotension and tachycardia. Patient's BP responded to IV fluids in the ED. Also found to have new onset atrial fibrillation.  No chest pain or SOB. Troponin negative. Chest x-ray negative for an acute cardiopulmonary process. BS elevated (226) however no evidence of DKA. Patient admitted with orthopedics consulted.   Consults  7:35 PM = Spoke with Dr. Erlinda Hong who will come and see the patient      Final impressions: 1. Osteomyelitis of ankle or foot, acute, right   2. Atrial fibrillation, unspecified   3. Gangrene of foot   4. Diabetes mellitus type II, uncontrolled   5. Sepsis, due to unspecified organism      Mercy Moore PA-C   This patient was discussed with Dr. Huel Cote, PA-C 12/03/13 2246

## 2013-12-03 NOTE — ED Notes (Signed)
Note sent to Pharmacy to obtain medication.

## 2013-12-03 NOTE — ED Notes (Signed)
Attempted IV x2. Phlebotomy at the bedside.

## 2013-12-03 NOTE — Consult Note (Signed)
CARDIOLOGY CONSULT NOTE  Patient ID: Steven [name], MRN: [id number], DOB/AGE: 1960-12-24 53 y.o. Admit date: 12/03/2013 Date of Consult: 12/03/2013  Primary Physician: Chrisandra Netters, MD Primary Cardiologist: unassigned  Chief Complaint: skin concerns regarding recent surgery Reason for Consultation: afib with RVR  HPI: 53 y.o. male w/ PMHx significant for DM2 with complications of foot ulcerations, osteomyelitis s/p multiple amputations, most recently 6/12 who presented to [facility] on 12/03/2013 with concerns about his surgical site as well as his R foot which he reports has increased pain and drainage.  At the ER, he was found to be tachycardic into the 170s atrial fibrillation but was completely asymptomatic and unaware. He reports no history of arrhythmias, chest discomfort, palpitations, shortness of breath, PND or orthopnea. He was told a long time ago that he had a "hole in his heart" but he can't supply any more details than that (denies history of echo or heart ultrasound).  In the ER, was hypotensive and given broad spectrum abx, 1 L NS. Heart rates improved and then he spontaneously converted back to sinus. Was unaware of conversion.     Past Medical History  Diagnosis Date  . DM (diabetes mellitus)   . HTN (hypertension)   . History of DVT (deep vein thrombosis)   . Personal history of diabetic foot ulcer   . Peripheral vascular disease   . Peripheral neuropathy 11/19/2011  . Personal history of diabetic foot ulcer   . Insomnia   . Sinus congestion   . Varicose vein     legs  . Osteomyelitis of foot, right, acute 08/30/2012  . Personal history of colonic adenomas 05/29/2013      Surgical History:  Past Surgical History  Procedure Laterality Date  . Amputation  04/21/2011    Procedure: AMPUTATION RAY;  Surgeon: Newt Minion, MD;  Location: [place];  Service: Orthopedics;  Laterality: Right;  Rt foot 2nd ray ampt  . I&d extremity  05/03/2011    Procedure:  IRRIGATION AND DEBRIDEMENT EXTREMITY;  Surgeon: Newt Minion, MD;  Location: [place];  Service: Orthopedics;  Laterality: Right;  Irrigation and Debridement Right Foot and Place Acell Xenograft  . Toe amputation  04/24/2012    great toe   right foot  . Amputation  04/24/2012    Procedure: AMPUTATION RAY;  Surgeon: Newt Minion, MD;  Location: [place];  Service: Orthopedics;  Laterality: Right;  Right foot 1st ray amputation  . Amputation Left 04/23/2013    Procedure: AMPUTATION RAY;  Surgeon: Newt Minion, MD;  Location: [place];  Service: Orthopedics;  Laterality: Left;  Left Foot 5th Ray Amputation  . Colonoscopy    . Amputation Left 11/21/2013    Procedure: AMPUTATION RAY;  Surgeon: Newt Minion, MD;  Location: [place];  Service: Orthopedics;  Laterality: Left;  Left Foot 1st & 2nd Ray Amputation     Home Meds: Prior to Admission medications   Medication Sig Start Date End Date Taking? Authorizing Provider  albuterol (PROVENTIL HFA;VENTOLIN HFA) 108 (90 BASE) MCG/ACT inhaler Inhale 2 puffs into the lungs every 6 (six) hours as needed for wheezing or shortness of breath. 05/01/13  Yes Leeanne Rio, MD  atorvastatin (LIPITOR) 40 MG tablet Take 1 tablet (40 mg total) by mouth daily. 10/01/13  Yes Leeanne Rio, MD  cetirizine (ZYRTEC) 10 MG tablet Take 1 tablet (10 mg total) by mouth daily. 05/01/13  Yes Leeanne Rio, MD  doxycycline (VIBRAMYCIN) 100 MG capsule  Take 100 mg by mouth 2 (two) times daily.   Yes Historical Provider, MD  fluticasone (FLONASE) 50 MCG/ACT nasal spray Place 2 sprays into both nostrils daily. 05/01/13  Yes Leeanne Rio, MD  gabapentin (NEURONTIN) 300 MG capsule Take 300-600 mg by mouth 3 (three) times daily. Take 300mg  in the morning, take 300mg  at lunch, and take 600mg  at bedtime.   Yes Historical Provider, MD  hydrocortisone cream 1 % Apply 1 application topically 2 (two) times daily as needed for itching.   Yes Historical Provider, MD  insulin  glargine (LANTUS) 100 UNIT/ML injection Inject 30 Units into the skin at bedtime.   Yes Historical Provider, MD  lisinopril-hydrochlorothiazide (PRINZIDE,ZESTORETIC) 20-25 MG per tablet Take 1 tablet by mouth daily.   Yes Leeanne Rio, MD    Inpatient Medications:   . [START ON 12/04/2013] piperacillin-tazobactam (ZOSYN)  IV    . vancomycin Stopped (12/03/13 2006)    Allergies: No Known Allergies  History   Social History  . Marital Status: Single    Spouse Name: N/A    Number of Children: N/A  . Years of Education: N/A   Occupational History  . Not on file.   Social History Main Topics  . Smoking status: Current Every Day Smoker -- 0.50 packs/day for 30 years    Types: Cigarettes  . Smokeless tobacco: Never Used  . Alcohol Use: 2.5 - 3.0 oz/week    5-6 drink(s) per week     Comment: t states he hs rank any alcholol rcently.  drinks off and on  . Drug Use: No     Comment: ast use of cocane an marjuana  . Sexual Activity: Yes    Birth Control/ Protection: Injection   Other Topics Concern  . Not on file   Social History Narrative  . No narrative on file     Family History  Problem Relation Age of Onset  . Diabetes Mother   . Stroke Brother   . Colon cancer Neg Hx      Review of Systems: General: negative for weight changes.  Cardiovascular: see HPI Dermatological: see HPI Respiratory: negative for cough or wheezing Urologic: negative for hematuria Abdominal: negative for nausea, vomiting, diarrhea, bright red blood per rectum, melena, or hematemesis Neurologic: negative for visual changes, syncope, or dizziness All other systems reviewed and are otherwise negative except as noted above.  Labs:  Recent Labs  12/03/13 1701  TROPONINI <0.30   Lab Results  Component Value Date   WBC 14.5* 12/03/2013   HGB 14.6 12/03/2013   HCT 41.7 12/03/2013   MCV 84.4 12/03/2013   PLT 404* 12/03/2013    Recent Labs Lab 12/03/13 1701  NA 130*  K 3.8  CL 87*   CO2 26  BUN 31*  CREATININE 1.23  CALCIUM 10.6*  GLUCOSE 226*   Lab Results  Component Value Date   CHOL 148 04/18/2011   HDL 40 04/18/2011   LDLCALC 91 04/18/2011   TRIG 83 04/18/2011   No results found for this basename: DDIMER    Radiology/Studies:  Dg Chest 2 View  12/03/2013   CLINICAL DATA:  chest pain  EXAM: CHEST  2 VIEW  COMPARISON:  Two-view chest radiograph 11/21/2013  FINDINGS: Low lung volumes. The heart size and mediastinal contours are within normal limits. Both lungs are clear. The visualized skeletal structures are unremarkable.  IMPRESSION: No active cardiopulmonary disease.   Electronically Signed   By: Margaree Mackintosh M.D.   On:  12/03/2013 18:26   Dg Foot Complete Right  12/03/2013   CLINICAL DATA:  Pain and swelling.  EXAM: RIGHT FOOT COMPLETE - 3+ VIEW  COMPARISON:  Radiographs 08/30/2012 and MRI 03/07/2013.  FINDINGS: Diffuse hindfoot soft tissue swelling and air in the soft tissues worrisome for infection (gas gangrene). There is an open wound on the lateral aspect of the foot near the base of the fifth metatarsal. Findings suspicious for osteomyelitis.  IMPRESSION: Diffuse hindfoot swelling and gas in the soft tissues worrisome for infection (gas gangrene).  Open wound near the base of the fifth metatarsal with underlying osteomyelitis.   Electronically Signed   By: Kalman Jewels M.D.   On: 12/03/2013 18:25    EKG: coarse afib with RVR, LVH by AVL criteria, nonspec TW flattening laterally  Physical Exam: Blood pressure 93/79, pulse 26, temperature 98.3 F (36.8 C), temperature source Oral, resp. rate 23, height 6' 0.05" (1.83 m), weight 94.2 kg (207 lb 10.8 oz), SpO2 96.00%. General: in no acute distress, strong odor in the ER Head: Normocephalic, atraumatic, sclera non-icteric, no xanthomas, nares are without discharge.  Neck: Supple. Negative for carotid bruits. JVD not elevated. Lungs: Clear bilaterally to auscultation without wheezes, rales, or rhonchi.  Breathing is unlabored. Heart: RRR with S1 S2. No murmurs, rubs, or gallops appreciated. Abdomen: Soft, non-tender, non-distended with normoactive bowel sounds. No hepatomegaly. No rebound/guarding. No obvious abdominal masses. Msk:   tone appear normal for age. Extremities: peripherals warm, LE with amputations, right ankle with necrotic appearing lesion with drainage, L surgical site intact Neuro: Alert and oriented X 3. Moves all extremities spontaneously. Psych:  Responds to questions appropriately with a normal affect.   Assessment and Plan:  Problem List 1. Atrial fibrillation with RVR, now back in sinus 2. Potential osteomyelitis of LE, s/p recent L amputation, SIRS (hypotensive, elevated WBC) 3. PVD by ABIs 4. HTN 5. DM2  53 y.o. male w/ PMHx significant for DM2 with complications of foot ulcerations, osteomyelitis s/p multiple amputations, most recently 6/12 who presented to [facility] on 12/03/2013 with concerns for osteomyelitis/cellulitis of his LE ulcerations now meeting SIRS criteria, complicated by atrial fibrillation with RVR, completely asymptomatic and now resolved.  Likely trigger for atrial fibrillation is the increased adrenaline state of his infection but further evaluation is needed included transthoracic echo and TSH/free T4 to rule out structural heart disease and thyroid screen. Troponin negative.  At this point, he is back in sinus. Would typically recommend and oral B blocker or calcium channel blocker but given his relative hypotension, this is contraindicated. Will need to reconsider once SIRS resolved.  Additionally, his risk of stroke due to the afib is somewhat elevated with a CHADS2 Vasc score of 3 indicating that long term anticoagulation is recommended. Briefly discussed with patient and finances (Medicaid) do make cost a consideration (novel oral anticoagulants are expensive, unsure which ones are covered). For now, continue aspirin 160 mg qday or  greater. Also will need to figure out if surgery will be needed for his foot infections which also influence when anticoagulation is to be started.  Regarding his PVD, should remain on ACEI (on hold for now due to hypotension) and statin (on atorvastatin 40).   Summary of recommendations: -echo, TSH, free T4 -continue statin, aspirin -restart ACEI and add nodal blocker agent (beta blocker) when SIRS resolved.  Thank you for this consult. We will continue to follow. Please call with questions.  Signed, Elias Else, Mila Homer MD 12/03/2013, 8:14 PM

## 2013-12-03 NOTE — ED Notes (Signed)
Janett Billow, RN at the bedside attempting to place IV.

## 2013-12-03 NOTE — Consult Note (Addendum)
ORTHOPAEDIC CONSULTATION  REQUESTING PHYSICIAN: Lupita Dawn, MD  Chief Complaint: Right foot abscess, ulcers  HPI: Steven [name] is a 53 y.o. male who complains of right foot pus, drainage, pain and ulcers for the last few days.  He's a patient of Dr. Jess Barters.  Sharol Given has performed multiple amputations of his toes.  During his last clinic visit, Dr. Jess Barters recommendation was to perform a BKA due to his ischemic, nonhealing ulcers and location of ulcers.  Patient was resistant to this and opted for abx instead.  He presents today with worsening of foot ulcers, pus, pain.  Ortho consulted.  Past Medical History  Diagnosis Date  . DM (diabetes mellitus)   . HTN (hypertension)   . History of DVT (deep vein thrombosis)   . Personal history of diabetic foot ulcer   . Peripheral vascular disease   . Peripheral neuropathy 11/19/2011  . Personal history of diabetic foot ulcer   . Insomnia   . Sinus congestion   . Varicose vein     legs  . Osteomyelitis of foot, right, acute 08/30/2012  . Personal history of colonic adenomas 05/29/2013   Past Surgical History  Procedure Laterality Date  . Amputation  04/21/2011    Procedure: AMPUTATION RAY;  Surgeon: Newt Minion, MD;  Location: [place];  Service: Orthopedics;  Laterality: Right;  Rt foot 2nd ray ampt  . I&d extremity  05/03/2011    Procedure: IRRIGATION AND DEBRIDEMENT EXTREMITY;  Surgeon: Newt Minion, MD;  Location: [place];  Service: Orthopedics;  Laterality: Right;  Irrigation and Debridement Right Foot and Place Acell Xenograft  . Toe amputation  04/24/2012    great toe   right foot  . Amputation  04/24/2012    Procedure: AMPUTATION RAY;  Surgeon: Newt Minion, MD;  Location: [place];  Service: Orthopedics;  Laterality: Right;  Right foot 1st ray amputation  . Amputation Left 04/23/2013    Procedure: AMPUTATION RAY;  Surgeon: Newt Minion, MD;  Location: [place];  Service: Orthopedics;  Laterality: Left;  Left Foot 5th Ray Amputation  .  Colonoscopy    . Amputation Left 11/21/2013    Procedure: AMPUTATION RAY;  Surgeon: Newt Minion, MD;  Location: [place];  Service: Orthopedics;  Laterality: Left;  Left Foot 1st & 2nd Ray Amputation   History   Social History  . Marital Status: Single    Spouse Name: N/A    Number of Children: N/A  . Years of Education: N/A   Social History Main Topics  . Smoking status: Current Every Day Smoker -- 0.50 packs/day for 30 years    Types: Cigarettes  . Smokeless tobacco: Never Used  . Alcohol Use: 2.5 - 3.0 oz/week    5-6 drink(s) per week     Comment: t states he hs rank any alcholol rcently.  drinks off and on  . Drug Use: No     Comment: ast use of cocane an marjuana  . Sexual Activity: Yes    Birth Control/ Protection: Injection   Other Topics Concern  . None   Social History Narrative  . None   Family History  Problem Relation Age of Onset  . Diabetes Mother   . Stroke Brother   . Colon cancer Neg Hx    No Known Allergies Prior to Admission medications   Medication Sig Start Date End Date Taking? Authorizing Provider  albuterol (PROVENTIL HFA;VENTOLIN HFA) 108 (90 BASE) MCG/ACT inhaler Inhale 2 puffs  into the lungs every 6 (six) hours as needed for wheezing or shortness of breath. 05/01/13  Yes Leeanne Rio, MD  atorvastatin (LIPITOR) 40 MG tablet Take 1 tablet (40 mg total) by mouth daily. 10/01/13  Yes Leeanne Rio, MD  cetirizine (ZYRTEC) 10 MG tablet Take 1 tablet (10 mg total) by mouth daily. 05/01/13  Yes Leeanne Rio, MD  doxycycline (VIBRAMYCIN) 100 MG capsule Take 100 mg by mouth 2 (two) times daily.   Yes Historical Provider, MD  fluticasone (FLONASE) 50 MCG/ACT nasal spray Place 2 sprays into both nostrils daily. 05/01/13  Yes Leeanne Rio, MD  gabapentin (NEURONTIN) 300 MG capsule Take 300-600 mg by mouth 3 (three) times daily. Take 300mg  in the morning, take 300mg  at lunch, and take 600mg  at bedtime.   Yes Historical Provider, MD    hydrocortisone cream 1 % Apply 1 application topically 2 (two) times daily as needed for itching.   Yes Historical Provider, MD  insulin glargine (LANTUS) 100 UNIT/ML injection Inject 30 Units into the skin at bedtime.   Yes Historical Provider, MD  lisinopril-hydrochlorothiazide (PRINZIDE,ZESTORETIC) 20-25 MG per tablet Take 1 tablet by mouth daily.   Yes Leeanne Rio, MD   Dg Chest 2 View  12/03/2013   CLINICAL DATA:  chest pain  EXAM: CHEST  2 VIEW  COMPARISON:  Two-view chest radiograph 11/21/2013  FINDINGS: Low lung volumes. The heart size and mediastinal contours are within normal limits. Both lungs are clear. The visualized skeletal structures are unremarkable.  IMPRESSION: No active cardiopulmonary disease.   Electronically Signed   By: Margaree Mackintosh M.D.   On: 12/03/2013 18:26   Dg Foot Complete Right  12/03/2013   CLINICAL DATA:  Pain and swelling.  EXAM: RIGHT FOOT COMPLETE - 3+ VIEW  COMPARISON:  Radiographs 08/30/2012 and MRI 03/07/2013.  FINDINGS: Diffuse hindfoot soft tissue swelling and air in the soft tissues worrisome for infection (gas gangrene). There is an open wound on the lateral aspect of the foot near the base of the fifth metatarsal. Findings suspicious for osteomyelitis.  IMPRESSION: Diffuse hindfoot swelling and gas in the soft tissues worrisome for infection (gas gangrene).  Open wound near the base of the fifth metatarsal with underlying osteomyelitis.   Electronically Signed   By: Kalman Jewels M.D.   On: 12/03/2013 18:25    Positive ROS: All other systems have been reviewed and were otherwise negative with the exception of those mentioned in the HPI and as above.  Physical Exam: General: Alert, no acute distress Cardiovascular: No pedal edema Respiratory: No cyanosis, no use of accessory musculature GI: No organomegaly, abdomen is soft and non-tender Skin: No lesions in the area of chief complaint Neurologic: Sensation intact distally Psychiatric:  Patient is competent for consent with normal mood and affect Lymphatic: No axillary or cervical lymphadenopathy  MUSCULOSKELETAL:  - large medial heel ulcer with black eschar and frank pus with mal odor, wound probes to bone - 3 small wounds on lateral border of foot that probes down to bone - foot is hypersensitive to touch - 2+ DP pulse - postsurgical changes - frank pus  Assessment: Right foot ischemic ulcers, abscess  Plan: - discussed with patient that given findings and severe tissue loss and location of tissue loss, limb salvage is not possible - recommend BKA - discussed r/b/a to BKA with patient, he voiced understanding and wishes to proceed - consent signed - NPO after midnight - plan for surgery tomorrow, please hold  anticoagulation if possible  Thank you for the consult and the opportunity to see Mr. Camrin Gearheart. Eduard Roux, MD [place] 9:58 PM

## 2013-12-04 ENCOUNTER — Inpatient Hospital Stay (HOSPITAL_COMMUNITY): Payer: Medicaid Other | Admitting: Anesthesiology

## 2013-12-04 ENCOUNTER — Encounter (HOSPITAL_COMMUNITY): Admission: EM | Disposition: A | Discharge status: Rehabilitation Facility | Payer: Self-pay | Source: Home / Self Care

## 2013-12-04 ENCOUNTER — Encounter (HOSPITAL_COMMUNITY): Payer: Medicaid Other | Admitting: Anesthesiology

## 2013-12-04 ENCOUNTER — Encounter (HOSPITAL_COMMUNITY): Payer: Self-pay | Admitting: Physician Assistant

## 2013-12-04 DIAGNOSIS — Z720 Tobacco use: Secondary | ICD-10-CM | POA: Diagnosis present

## 2013-12-04 DIAGNOSIS — F1491 Cocaine use, unspecified, in remission: Secondary | ICD-10-CM | POA: Diagnosis present

## 2013-12-04 DIAGNOSIS — I739 Peripheral vascular disease, unspecified: Secondary | ICD-10-CM | POA: Diagnosis present

## 2013-12-04 DIAGNOSIS — Z87898 Personal history of other specified conditions: Secondary | ICD-10-CM | POA: Diagnosis present

## 2013-12-04 DIAGNOSIS — I517 Cardiomegaly: Secondary | ICD-10-CM

## 2013-12-04 DIAGNOSIS — I4891 Unspecified atrial fibrillation: Secondary | ICD-10-CM

## 2013-12-04 DIAGNOSIS — A419 Sepsis, unspecified organism: Secondary | ICD-10-CM

## 2013-12-04 HISTORY — PX: AMPUTATION: SHX166

## 2013-12-04 LAB — CBC
HCT: 31.1 % — ABNORMAL LOW (ref 39.0–52.0)
Hemoglobin: 10.5 g/dL — ABNORMAL LOW (ref 13.0–17.0)
MCH: 28.5 pg (ref 26.0–34.0)
MCHC: 33.8 g/dL (ref 30.0–36.0)
MCV: 84.5 fL (ref 78.0–100.0)
PLATELETS: 424 10*3/uL — AB (ref 150–400)
RBC: 3.68 MIL/uL — AB (ref 4.22–5.81)
RDW: 14.3 % (ref 11.5–15.5)
WBC: 12.6 10*3/uL — AB (ref 4.0–10.5)

## 2013-12-04 LAB — GLUCOSE, CAPILLARY
GLUCOSE-CAPILLARY: 165 mg/dL — AB (ref 70–99)
GLUCOSE-CAPILLARY: 168 mg/dL — AB (ref 70–99)
GLUCOSE-CAPILLARY: 175 mg/dL — AB (ref 70–99)
GLUCOSE-CAPILLARY: 188 mg/dL — AB (ref 70–99)
GLUCOSE-CAPILLARY: 212 mg/dL — AB (ref 70–99)
Glucose-Capillary: 137 mg/dL — ABNORMAL HIGH (ref 70–99)
Glucose-Capillary: 154 mg/dL — ABNORMAL HIGH (ref 70–99)

## 2013-12-04 LAB — HEPARIN LEVEL (UNFRACTIONATED)

## 2013-12-04 LAB — RAPID URINE DRUG SCREEN, HOSP PERFORMED
Amphetamines: NOT DETECTED
BARBITURATES: NOT DETECTED
Benzodiazepines: NOT DETECTED
Cocaine: POSITIVE — AB
Opiates: NOT DETECTED
Tetrahydrocannabinol: NOT DETECTED

## 2013-12-04 LAB — BASIC METABOLIC PANEL
BUN: 19 mg/dL (ref 6–23)
CO2: 24 meq/L (ref 19–32)
Calcium: 8.6 mg/dL (ref 8.4–10.5)
Chloride: 97 mEq/L (ref 96–112)
Creatinine, Ser: 0.85 mg/dL (ref 0.50–1.35)
GFR calc Af Amer: >90 mL/min (ref >90)
Glucose, Bld: 144 mg/dL — ABNORMAL HIGH (ref 70–99)
Potassium: 3.4 mEq/L — ABNORMAL LOW (ref 3.7–5.3)
SODIUM: 135 meq/L — AB (ref 137–147)

## 2013-12-04 LAB — C-REACTIVE PROTEIN: CRP: 28.7 mg/dL — AB (ref <0.60)

## 2013-12-04 LAB — SEDIMENTATION RATE: SED RATE: 120 mm/h — AB (ref 0–16)

## 2013-12-04 LAB — HEMOGLOBIN A1C
Hgb A1c MFr Bld: 14.6 % — ABNORMAL HIGH (ref <5.7)
Mean Plasma Glucose: 372 mg/dL — ABNORMAL HIGH (ref <117)

## 2013-12-04 LAB — MRSA PCR SCREENING: MRSA by PCR: NEGATIVE

## 2013-12-04 LAB — TSH: TSH: 0.457 u[IU]/mL (ref 0.350–4.500)

## 2013-12-04 SURGERY — AMPUTATION BELOW KNEE
Anesthesia: General | Site: Leg Lower | Laterality: Right

## 2013-12-04 MED ORDER — LIDOCAINE HCL (CARDIAC) 10 MG/ML IV SOLN
INTRAVENOUS | Status: DC | PRN
  Administered 2013-12-04: 60 mg via INTRAVENOUS

## 2013-12-04 MED ORDER — LACTATED RINGERS IV SOLN
INTRAVENOUS | Status: DC | PRN
  Administered 2013-12-04: 15:00:00 via INTRAVENOUS

## 2013-12-04 MED ORDER — PHENYLEPHRINE HCL 10 MG/ML IJ SOLN
INTRAMUSCULAR | Status: DC | PRN
  Administered 2013-12-04 (×5): 80 ug via INTRAVENOUS

## 2013-12-04 MED ORDER — HYDROMORPHONE HCL PF 1 MG/ML IJ SOLN
INTRAMUSCULAR | Status: DC | PRN
  Administered 2013-12-04 (×2): 1 mg via INTRAVENOUS

## 2013-12-04 MED ORDER — POTASSIUM CHLORIDE IN NACL 40-0.9 MEQ/L-% IV SOLN
INTRAVENOUS | Status: DC
  Administered 2013-12-04 – 2013-12-06 (×2): 150 mL/h via INTRAVENOUS
  Administered 2013-12-06: 10 mL/h via INTRAVENOUS
  Filled 2013-12-04 (×11): qty 1000

## 2013-12-04 MED ORDER — HYDROMORPHONE HCL PF 1 MG/ML IJ SOLN
INTRAMUSCULAR | Status: AC
  Filled 2013-12-04: qty 1

## 2013-12-04 MED ORDER — LIDOCAINE HCL (CARDIAC) 20 MG/ML IV SOLN
INTRAVENOUS | Status: AC
  Filled 2013-12-04: qty 5

## 2013-12-04 MED ORDER — HYDROMORPHONE HCL PF 1 MG/ML IJ SOLN
0.2500 mg | INTRAMUSCULAR | Status: DC | PRN
  Administered 2013-12-04: 0.5 mg via INTRAVENOUS
  Filled 2013-12-04: qty 1

## 2013-12-04 MED ORDER — MIDAZOLAM HCL 5 MG/5ML IJ SOLN
INTRAMUSCULAR | Status: DC | PRN
  Administered 2013-12-04: 2 mg via INTRAVENOUS

## 2013-12-04 MED ORDER — ONDANSETRON HCL 4 MG/2ML IJ SOLN
INTRAMUSCULAR | Status: AC
  Filled 2013-12-04: qty 2

## 2013-12-04 MED ORDER — FOLIC ACID 5 MG/ML IJ SOLN
1.0000 mg | Freq: Every day | INTRAMUSCULAR | Status: DC
  Administered 2013-12-04 – 2013-12-05 (×2): 1 mg via INTRAVENOUS
  Filled 2013-12-04 (×2): qty 0.2

## 2013-12-04 MED ORDER — LACTATED RINGERS IV SOLN
INTRAVENOUS | Status: DC
  Administered 2013-12-04: 15:00:00 via INTRAVENOUS

## 2013-12-04 MED ORDER — OXYCODONE HCL 5 MG PO TABS
5.0000 mg | ORAL_TABLET | ORAL | Status: DC | PRN
  Administered 2013-12-04: 10 mg via ORAL
  Administered 2013-12-05: 15 mg via ORAL
  Administered 2013-12-05: 10 mg via ORAL
  Administered 2013-12-05: 15 mg via ORAL
  Administered 2013-12-05: 10 mg via ORAL
  Administered 2013-12-05 – 2013-12-06 (×4): 15 mg via ORAL
  Administered 2013-12-06: 10 mg via ORAL
  Administered 2013-12-06 – 2013-12-09 (×11): 15 mg via ORAL
  Filled 2013-12-04 (×4): qty 3
  Filled 2013-12-04: qty 2
  Filled 2013-12-04 (×3): qty 3
  Filled 2013-12-04: qty 2
  Filled 2013-12-04 (×5): qty 3
  Filled 2013-12-04: qty 2
  Filled 2013-12-04 (×2): qty 3
  Filled 2013-12-04: qty 2
  Filled 2013-12-04 (×4): qty 3

## 2013-12-04 MED ORDER — FENTANYL CITRATE 0.05 MG/ML IJ SOLN
INTRAMUSCULAR | Status: DC | PRN
  Administered 2013-12-04: 100 ug via INTRAVENOUS
  Administered 2013-12-04: 50 ug via INTRAVENOUS
  Administered 2013-12-04: 100 ug via INTRAVENOUS

## 2013-12-04 MED ORDER — SODIUM CHLORIDE 0.9 % IV SOLN
INTRAVENOUS | Status: DC | PRN
Start: 2013-12-04 — End: 2013-12-04
  Administered 2013-12-04: 1000 mL

## 2013-12-04 MED ORDER — HYDROMORPHONE HCL PF 1 MG/ML IJ SOLN
1.0000 mg | INTRAMUSCULAR | Status: DC | PRN
  Administered 2013-12-04: 1 mg via INTRAVENOUS
  Administered 2013-12-04: 0.5 mg via INTRAVENOUS
  Administered 2013-12-05: 1 mg via INTRAVENOUS
  Filled 2013-12-04 (×2): qty 1

## 2013-12-04 MED ORDER — MIDAZOLAM HCL 2 MG/2ML IJ SOLN
INTRAMUSCULAR | Status: AC
  Filled 2013-12-04: qty 2

## 2013-12-04 MED ORDER — HYDROCODONE-ACETAMINOPHEN 5-325 MG PO TABS: 1.0000 | ORAL_TABLET | Freq: Four times a day (QID) | ORAL | Status: DC | PRN

## 2013-12-04 MED ORDER — FENTANYL CITRATE 0.05 MG/ML IJ SOLN
INTRAMUSCULAR | Status: AC
  Filled 2013-12-04: qty 5

## 2013-12-04 MED ORDER — PROPOFOL 10 MG/ML IV BOLUS
INTRAVENOUS | Status: DC | PRN
  Administered 2013-12-04: 170 mg via INTRAVENOUS

## 2013-12-04 MED ORDER — PIPERACILLIN-TAZOBACTAM 3.375 G IVPB
3.3750 g | INTRAVENOUS | Status: AC
  Administered 2013-12-04: 3.375 g via INTRAVENOUS
  Filled 2013-12-04: qty 50

## 2013-12-04 MED ORDER — THIAMINE HCL 100 MG/ML IJ SOLN
100.0000 mg | Freq: Every day | INTRAMUSCULAR | Status: DC
Start: 2013-12-04 — End: 2013-12-05
  Administered 2013-12-04 – 2013-12-05 (×2): 100 mg via INTRAVENOUS
  Filled 2013-12-04 (×2): qty 1

## 2013-12-04 MED ORDER — PIPERACILLIN-TAZOBACTAM 3.375 G IVPB
3.3750 g | Freq: Three times a day (TID) | INTRAVENOUS | Status: DC
  Administered 2013-12-04 – 2013-12-06 (×5): 3.375 g via INTRAVENOUS
  Filled 2013-12-04 (×7): qty 50

## 2013-12-04 SURGICAL SUPPLY — 46 items
BANDAGE ESMARK 6X9 LF (GAUZE/BANDAGES/DRESSINGS) ×1 IMPLANT
BANDAGE GAUZE ELAST BULKY 4 IN (GAUZE/BANDAGES/DRESSINGS) IMPLANT
BLADE SAW RECIP 87.9 MT (BLADE) ×3 IMPLANT
BNDG COHESIVE 4X5 TAN STRL (GAUZE/BANDAGES/DRESSINGS) ×3 IMPLANT
BNDG COHESIVE 6X5 TAN STRL LF (GAUZE/BANDAGES/DRESSINGS) ×3 IMPLANT
BNDG ESMARK 6X9 LF (GAUZE/BANDAGES/DRESSINGS) ×3
COVER SURGICAL LIGHT HANDLE (MISCELLANEOUS) ×3 IMPLANT
CUFF TOURNIQUET SINGLE 34IN LL (TOURNIQUET CUFF) ×3 IMPLANT
DRAPE EXTREMITY T 121X128X90 (DRAPE) ×3 IMPLANT
DRAPE ORTHO SPLIT 77X108 STRL (DRAPES)
DRAPE PROXIMA HALF (DRAPES) ×3 IMPLANT
DRAPE SURG ORHT 6 SPLT 77X108 (DRAPES) IMPLANT
DRAPE U-SHAPE 47X51 STRL (DRAPES) ×3 IMPLANT
DRSG PAD ABDOMINAL 8X10 ST (GAUZE/BANDAGES/DRESSINGS) ×3 IMPLANT
ELECT CAUTERY BLADE 6.4 (BLADE) ×3 IMPLANT
ELECT REM PT RETURN 9FT ADLT (ELECTROSURGICAL) ×3
ELECTRODE REM PT RTRN 9FT ADLT (ELECTROSURGICAL) ×1 IMPLANT
EVACUATOR 1/8 PVC DRAIN (DRAIN) ×3 IMPLANT
FACESHIELD WRAPAROUND (MASK) IMPLANT
GAUZE XEROFORM 5X9 LF (GAUZE/BANDAGES/DRESSINGS) ×3 IMPLANT
GLOVE SURG SS PI 7.5 STRL IVOR (GLOVE) ×6 IMPLANT
GOWN STRL REIN XL XLG (GOWN DISPOSABLE) ×3 IMPLANT
IV NS 1000ML (IV SOLUTION) ×4
IV NS 1000ML BAXH (IV SOLUTION) ×2 IMPLANT
KIT BASIN OR (CUSTOM PROCEDURE TRAY) ×3 IMPLANT
NS IRRIG 1000ML POUR BTL (IV SOLUTION) ×3 IMPLANT
PACK GENERAL/GYN (CUSTOM PROCEDURE TRAY) ×3 IMPLANT
PAD ARMBOARD 7.5X6 YLW CONV (MISCELLANEOUS) ×3 IMPLANT
PAD CAST 4YDX4 CTTN HI CHSV (CAST SUPPLIES) ×1 IMPLANT
PADDING CAST ABS 4INX4YD NS (CAST SUPPLIES) ×2
PADDING CAST ABS COTTON 4X4 ST (CAST SUPPLIES) ×1 IMPLANT
PADDING CAST COTTON 4X4 STRL (CAST SUPPLIES) ×2
SET IRRIG Y TYPE TUR BLADDER L (SET/KITS/TRAYS/PACK) ×3 IMPLANT
SPONGE GAUZE 4X4 12PLY (GAUZE/BANDAGES/DRESSINGS) ×3 IMPLANT
SPONGE GAUZE 4X4 12PLY STER LF (GAUZE/BANDAGES/DRESSINGS) ×3 IMPLANT
SPONGE LAP 18X18 X RAY DECT (DISPOSABLE) IMPLANT
STAPLER VISISTAT 35W (STAPLE) IMPLANT
STOCKINETTE IMPERVIOUS LG (DRAPES) ×3 IMPLANT
SUT ETHILON 2 0 PSLX (SUTURE) ×9 IMPLANT
SUT MON AB 2-0 CT1 36 (SUTURE) ×6 IMPLANT
SUT PDS AB 0 CT 36 (SUTURE) ×6 IMPLANT
SUT SILK 0 TIES 10X30 (SUTURE) ×3 IMPLANT
SUT SILK 2 0 SH CR/8 (SUTURE) IMPLANT
SUT VIC AB 2-0 CT1 27 (SUTURE) ×4
SUT VIC AB 2-0 CT1 TAPERPNT 27 (SUTURE) ×2 IMPLANT
TOWEL OR 17X26 10 PK STRL BLUE (TOWEL DISPOSABLE) ×9 IMPLANT

## 2013-12-04 NOTE — Op Note (Signed)
Date of surgery: 12/04/2013  Preoperative diagnosis: Right ischemic nonhealing ulcers of the right foot with abscess and osteomyelitis  Postoperative diagnosis: Same  Procedure: Transtibial amputation of the right lower extremity.  Surgeon: Eduard Roux, M.D.  Anesthesia: Gen.  Estimated blood loss: 729 cc  Complications: None next  Drains: One medium Hemovac  Condition to PACU: Stable  Indications for procedure: Steven [name] is a 53 year old gentleman with ischemic nonhealing ulcers of the right foot with significant soft tissue defects of the entire foot. He is indicated for below the knee amputation as limb salvage was not an option. The risks benefits alternatives to surgery were discussed with the patient and he elected to proceed.  Description of procedure: The patient was identified in the preoperative holding area. The operative site was marked by the surgeon and confirmed with the patient. He is brought back to the operating room. His placed supine on the table. General anesthesia was induced by the anesthesiologist. A nonsterile tourniquet was placed on the right upper thigh. Right lower extremity was prepped and draped in standard sterile fashion. Preoperative antibiotics were given. A timeout was performed. A fishmouth incision with a large posterior flap was used. Sharp dissection was carried through the muscle bellies. The neurovascular bundles were identified and tagged. The nerves were resected and allowed to retract up into the wound. The vascular structures were ligated. The tibia was osteotomized approximately 16 cm below the level of the joint. The fibula was osteotomized approximately 1-2 cm proximal to the level of the tibial osteotomy. The ends of the bone were smoothed off using a saw. The wound was then thoroughly irrigated with 2 L of saline. The tourniquet was deflated. Hemostasis was obtained. The wound was then closed in layer fashion using 0 PDS for the fascia, 2-0  Monocryl for the deep skin layer, and 2-0 nylon for the skin. Prior to closing of the fascia a medium Hemovac was placed deep to the fascia and brought out medially. A sterile dressing was applied. The patient was extubated and transferred to the PACU in stable condition.  Disposition: The patient will be nonweightbearing to the right lower extremity. We will follow the drain output and remove it when it's appropriate. He will require suture removal in approximately 3 weeks.  Steven Cecil, MD [place] 5:02 PM

## 2013-12-04 NOTE — Anesthesia Preprocedure Evaluation (Signed)
Anesthesia Evaluation  Patient identified by MRN, date of birth, ID band Patient awake    Reviewed: Allergy & Precautions, H&P , NPO status , Patient's Chart, lab work & pertinent test results  Airway Mallampati: II      Dental   Pulmonary Current Smoker,          Cardiovascular hypertension, + Peripheral Vascular Disease     Neuro/Psych    GI/Hepatic negative GI ROS, Neg liver ROS,   Endo/Other  diabetes  Renal/GU      Musculoskeletal   Abdominal   Peds  Hematology   Anesthesia Other Findings   Reproductive/Obstetrics                           Anesthesia Physical Anesthesia Plan  ASA: III  Anesthesia Plan: General   Post-op Pain Management:    Induction:   Airway Management Planned: LMA  Additional Equipment:   Intra-op Plan:   Post-operative Plan: Extubation in OR  Informed Consent: I have reviewed the patients History and Physical, chart, labs and discussed the procedure including the risks, benefits and alternatives for the proposed anesthesia with the patient or authorized representative who has indicated his/her understanding and acceptance.   Dental advisory given  Plan Discussed with: CRNA and Anesthesiologist  Anesthesia Plan Comments:         Anesthesia Quick Evaluation

## 2013-12-04 NOTE — Care Management Note (Unsigned)
    Page 1 of 1   12/04/2013     2:36:55 PM CARE MANAGEMENT NOTE 12/04/2013  Patient:  Steven [name],Steven [name]   Account Number:  [id number]  Date Initiated:  12/04/2013  Documentation initiated by:  GRAVES-BIGELOW,BRENDA  Subjective/Objective Assessment:   Pt in with afib.     Action/Plan:   CM consulted for medications. Xarelto is [name] preferred medication with Medicaid and will not need prior authorization. Pt is active with [facility] for [facility] RN. Pt will need resumption orders for services.   Anticipated DC Date:  12/05/2013   Anticipated DC Plan:  [place]  CM consult      [facility] Choice  HOME HEALTH   Choice offered to / List presented to:  C-1 Patient           Status of service:  In process, will continue to follow Medicare Important Message given?   (If response is "NO", the following Medicare IM given date fields will be blank) Date Medicare IM given:   Date Additional Medicare IM given:    Discharge Disposition:  [place]  Per UR Regulation:  Reviewed for med. necessity/level of care/duration of stay  If discussed at [place] of Stay Meetings, dates discussed:    Comments:

## 2013-12-04 NOTE — Progress Notes (Signed)
Family Medicine Teaching Service Daily Progress Note Intern Pager: [phone]  Patient name: Steven [name] Medical record number: [id number] Date of birth: [birth year] Age: 53 y.o. Gender: male  Primary Care Provider: Chrisandra Netters, MD Consultants: Orthopedics, cardiology Code Status: Full  Pt Overview and Major Events to Date:  6/24: Admitted for RLE osteomyelitis and gangrene; vanc/zosyn/clinda started 6/25: To OR for R BKA  Assessment and Plan: Steven [name] is a 53 y.o. male presenting with severe sepsis due to worsening infected Left-foot DM ulcers, found to have osteomyelitis and gas-gangrene, additionally found to be in new onset AFib with RVR. PMH is significant for DM-2 (poorly controlled, complicated peripheral neuropathy), multiple bilateral toe amputations (Dr. Sharol Given) with h/o osteomyelitis, HTN, PVD.   Sepsis secondary to osteomyelitis from R-foot infected ulcers  - NS @ 150 cc/hr s/p 3L NS boluses - Cortisol pending: If low, would dose with Hydrocortisone q 6 hrs   Osteomyelitis with gas gangrene, from worsening R-foot DM ulcers  - BKA today - Follow-up blood culture x 2 (collected 6/24)  - Vancomycin, zosyn, clindamycin (6/24- ) - Pain control: Fentanyl 25-41mg IV q 2 hr PRN, Tylenol, Gabapentin (home) - cautious with hypotension  - Leukocytosis improving 14.5 > 12.6 - ESR 120, CRP pending  Paroxysmal atrial fibrillation with RVR, unknown duration  - Spontaneously converted to NSR, pressures improved; will add beta blocker as pressures allow - Holding heparin pre-operatively, will need anticoagulation (CHADsVASC score 3 for DM, HTN, PAD)  - Consult Cardiology - greatly appreciate recommendations / management  - Echocardiogram pending  Anemia: Normocytic Hgb 10.5 - Monitor with daily CBC  PAD: shown with ABIs 2014  Acute Kidney Injury: Resolved - Hold ACEi, nephrotoxic agents   T2DM, uncontrolled - Last HgbA1c >14.0 (09/18/13)  - Hold home Lantus 30u  tonight, given NPO p MN - Moderate SSI  - CBG q 6 hr, while NPO   Hyponatremia, hypovolemic: Improving - Na 130 > 135 - IVF rehydration  - Monitor BMET   Hypokalemia, mild: K 3.4, replete with  HTN  - Hold home anti-HTN agents (Lisinopril - HCTZ) due to low BPs  HLD  - Resume home statin   Tobacco abuse  - Active smoker 0.5ppd, >30 yrs  - Nicotine patch 165mdaily   FEN/GI: s/p 3L IV bolus, NS w/4060mL KCl @ 150 cc/hr / Protonix IV  Prophylaxis: SCD  Disposition: To OR for R BKA today, continued cardiac work up to follow  Subjective:  Reports continued pain in the ankle, is very hungry and frustrated that he can't eat anything. Has not noticed any palpitations, denies CP, SOB.   Objective: Temp:  [98.3 F (36.8 C)-99.7 F (37.6 C)] 99.7 F (37.6 C) (06/25 0346) Pulse Rate:  [26-156] 89 (06/25 0346) Resp:  [15-25] 22 (06/25 0346) BP: (88-136)/(53-90) 98/63 mmHg (06/25 0346) SpO2:  [91 %-100 %] 95 % (06/25 0346) Weight:  [207 lb 10.8 oz (94.2 kg)-211 lb 6.7 oz (95.9 kg)] 211 lb 6.7 oz (95.9 kg) (06/24 2251) Physical Exam: General: 53 22o. sex resting quietly in NAD Cardiovascular: RRR no murmur, NSR on ECG Respiratory: Non-labored, coarse breath sounds bilaterallty Abdomen: +BS, soft, NT, ND Extremities: LLE: healing wound without evidence of infection; RLE: multiple malodorous, erythematous pus-draining ulcerations including medial heel and lateral maleolus.   Laboratory:  Recent Labs Lab 12/03/13 1701 12/04/13 0525  WBC 14.5* 12.6*  HGB 14.6 10.5*  HCT 41.7 31.1*  PLT 404* 424*    Recent Labs Lab 12/03/13 1701  12/04/13 0525  NA 130* 135*  K 3.8 3.4*  CL 87* 97  CO2 26 24  BUN 31* 19  CREATININE 1.23 0.85  CALCIUM 10.6* 8.6  GLUCOSE 226* 144*   Lactic Acid - 1.89  Troponin - negative x1  ESR - 120 CRP - pending TSH - 0.457 Cortisol - pending HgbA1c - pending  Blood Cultures (x 2, collected 12/03/13)   6/24 CXR 2v  FINDINGS:  Low lung  volumes. The heart size and mediastinal contours are within  normal limits. Both lungs are clear. The visualized skeletal  structures are unremarkable.  IMPRESSION:  No active cardiopulmonary disease.   6/24 Right Foot, complete  IMPRESSION:  Diffuse hindfoot swelling and gas in the soft tissues worrisome for  infection (gas gangrene).  Open wound near the base of the fifth metatarsal with underlying  osteomyelitis.  Vance Gather, MD 12/04/2013, 7:54 AM PGY-1, [place] Intern pager: [phone], text pages welcome

## 2013-12-04 NOTE — Transfer of Care (Signed)
Immediate Anesthesia Transfer of Care Note  Patient: Steven [name]  Procedure(s) Performed: Procedure(s): AMPUTATION BELOW KNEE (Right)  Patient Location: PACU  Anesthesia Type:General  Level of Consciousness: awake, alert , oriented and patient cooperative  Airway & Oxygen Therapy: Patient Spontanous Breathing and Patient connected to nasal cannula oxygen  Post-op Assessment: Report given to PACU RN, Post -op Vital signs reviewed and stable and Patient moving all extremities  Post vital signs: Reviewed and stable  Complications: No apparent anesthesia complications

## 2013-12-04 NOTE — H&P (Signed)
FMTS Attending Note  I personally saw and evaluated the patient. The plan of care was discussed with the resident team. I agree with the assessment and plan as documented by the resident.   53 year old male with past medical history of gangrene and osteomyelitis of bilateral feet status post multiple amputations, poorly controlled diabetes, hypertension, hyperlipidemia, tobacco abuse, peripheral neuropathy, peripheral arterial disease presents with infected right diabetic foot ulcers, sepsis, and new onset atrial fibrillation. Patient recently underwent amputation of left great toe 11/24/2013, over the past 2 days he is noticed increased erythema and drainage from his right lower extremity, he's had associated sweats however no fevers or chills, patient was also found to be in atrial fibrillation with rapid ventricular rate while in the emergency room, patient reports no associated dyspnea or chest pain, atrial fibrillation is resolved with fluid boluses, please refer to resident note for additional history of present illness.  Vitals: Reviewed General: Pleasant African American male, lying in hospital bed HEENT: Normocephalic, pupils are equal in size bilaterally, no scleral icterus, nasal septum midline, moist mucous members, uvula midline, neck was supple, no anterior posterior cervical lymphadenopathy Cardiac: Regular rate and rhythm, S1 and S2 present, no murmurs, no heaves or thrills Respiratory: Coarse breath sounds bilaterally with good air entry, normal work of breathing Abdomen: Soft, nontender, bowel sounds present, no rebound, no guarding Extremities: Trace pedal edema Skin: On the left lower extremity there is a healing wound from recent amputation without evidence of acute infection, on the right lower extremity there are multiple areas that are erythematous and draining pus (primarily over the lateral aspect of the right foot as well as the medial aspect of the heel)  Reviewed lab work,  imaging, and EKG  Assessment and plan: 53 year-old male admitted with sepsis secondary to osteomyelitis and subsequent A. fib with RVR 1. Osteomyelitis/gas gangrene of right lower extremity - patient currently on empiric antibiotics including Zosyn, vancomycin, and clindamycin, orthopedic surgery has evaluated the patient and have elected to take the patient to the OR for right BKA as a feel that the limb is not salvageable at this time 2. Sepsis due to also myelitis - due to osteomyelitis, continue empiric antibiotics, followup blood cultures 3. A. fib with RVR-currently in normal sinus rhythm status post fluid bolus, blood pressure is no longer hypotensive, no documented previous atrial fibrillation, CHADSVASC score of 3, awaiting 2-D echocardiogram, appreciate cardiology recommendations in regards to anticoagulation after amputation 4. Pain control-patient currently on when necessary fentanyl, transition to by mouth pain medications as tolerated after amputation 5. Uncontrolled type 2 diabetes- agree with resident plan to restart home Lantus and continue insulin sliding scale 6. Hypertension-patient hypotensive at time of admission likely secondary to sepsis, holding home antihypertensive medications, restart once pressures have improved 7. Hyperlipidemia-restart home statin after amputation 8. Tobacco abuse-counseled patient on tobacco cessation 9. Peripheral arterial disease-last ABI performed in March of 2014 which showed moderate severe disease of the right lower extremity and critical disease of the left lower extremity, consider repeat ABI to evaluate peripheral arterial disease  Dossie Arbour M.D. 6. Acute kidney injury - suspect prerenal cause in the setting of sepsis, monitor after rehydration and treatment for sepsis

## 2013-12-04 NOTE — Progress Notes (Signed)
[facility]  Patient Status: Active (receiving services up to time of hospitalization)  [facility] is providing the following services: RN  If patient discharges after hours, please call [phone].   Steven [name] 12/04/2013, 10:30 AM

## 2013-12-04 NOTE — Progress Notes (Signed)
Utilization Review Completed.Steven [name] T6/25/2015  

## 2013-12-04 NOTE — Progress Notes (Signed)
Orthopedic Tech Progress Note Patient Details:  [name] 07/12/1960 [id number]  Patient ID: Steven [name], male   DOB: 1960/08/26, 53 y.o.   MRN: [id number] Called in bio-tech brace order education fitting  Hildred Priest 12/04/2013, 6:13 PM

## 2013-12-04 NOTE — Progress Notes (Signed)
Patient: Steven [name] / Admit Date: 12/03/2013 / Date of Encounter: 12/04/2013, 9:02 AM  Subjective  C/o R ankle pain. No CP or SOB.  Objective   Telemetry: converted to NSR after 8pm, holding with PACs  Physical Exam: Blood pressure 97/57, pulse 87, temperature 99.2 F (37.3 C), temperature source Oral, resp. rate 15, height 6\' 4"  (1.93 m), weight 211 lb 6.7 oz (95.9 kg), SpO2 93.00%. General: Well developed, well nourished, in no acute distress. Head: Normocephalic, atraumatic, sclera non-icteric, no xanthomas, nares are without discharge. Neck: Negative for carotid bruits. JVP not elevated. Lungs: Clear bilaterally to auscultation without wheezes, rales, or rhonchi. Breathing is unlabored. Heart: RRR S1 S2 without murmurs, rubs, or gallops.  Abdomen: Soft, non-tender, non-distended with normoactive bowel sounds. No rebound/guarding. Extremities: No clubbing or cyanosis. S/p multiple toe amputations. No edema. R ankle is wrapped, did not unwrap to look at lesions. L surgical site also presently wrapped. Neuro: Alert and oriented X 3. Moves all extremities spontaneously. Psych:  Responds to questions appropriately with a normal affect.  Intake/Output Summary (Last 24 hours) at 12/04/13 0902 Last data filed at 12/03/13 2300  Gross per 24 hour  Intake   2150 ml  Output    725 ml  Net   1425 ml    Inpatient Medications:  . atorvastatin  40 mg Oral Daily  . clindamycin (CLEOCIN) IV  600 mg Intravenous 3 times per day  . gabapentin  300 mg Oral BID WC   And  . gabapentin  600 mg Oral QHS  . insulin aspart  0-15 Units Subcutaneous TID WC  . insulin aspart  0-5 Units Subcutaneous QHS  . insulin glargine  15 Units Subcutaneous QHS  . nicotine  14 mg Transdermal Daily  . pantoprazole (PROTONIX) IV  40 mg Intravenous Q24H  . piperacillin-tazobactam (ZOSYN)  IV  3.375 g Intravenous 3 times per day  . sodium chloride  3 mL Intravenous Q12H  . vancomycin  1,000 mg Intravenous Q8H    Infusions:  . sodium chloride      Labs:  Recent Labs  12/03/13 1701 12/04/13 0525  NA 130* 135*  K 3.8 3.4*  CL 87* 97  CO2 26 24  GLUCOSE 226* 144*  BUN 31* 19  CREATININE 1.23 0.85  CALCIUM 10.6* 8.6   No results found for this basename: AST, ALT, ALKPHOS, BILITOT, PROT, ALBUMIN,  in the last 72 hours  Recent Labs  12/03/13 1701 12/04/13 0525  WBC 14.5* 12.6*  NEUTROABS 10.5*  --   HGB 14.6 10.5*  HCT 41.7 31.1*  MCV 84.4 84.5  PLT 404* 424*    Recent Labs  12/03/13 1701  TROPONINI <0.30   No components found with this basename: POCBNP,  No results found for this basename: HGBA1C,  in the last 72 hours   Radiology/Studies:  Dg Chest 2 View  12/03/2013   CLINICAL DATA:  chest pain  EXAM: CHEST  2 VIEW  COMPARISON:  Two-view chest radiograph 11/21/2013  FINDINGS: Low lung volumes. The heart size and mediastinal contours are within normal limits. Both lungs are clear. The visualized skeletal structures are unremarkable.  IMPRESSION: No active cardiopulmonary disease.   Electronically Signed   By: Margaree Mackintosh M.D.   On: 12/03/2013 18:26   Dg Chest 2 View  11/21/2013   CLINICAL DATA:  Osteomyelitis left great toe.  Diabetes.  EXAM: CHEST  2 VIEW  COMPARISON:  04/24/2012  FINDINGS: Heart is normal size. No confluent airspace  opacities or effusions. No acute bony abnormality.  IMPRESSION: No active disease.   Electronically Signed   By: Rolm Baptise M.D.   On: 11/21/2013 12:57   Dg Foot Complete Right  12/03/2013   CLINICAL DATA:  Pain and swelling.  EXAM: RIGHT FOOT COMPLETE - 3+ VIEW  COMPARISON:  Radiographs 08/30/2012 and MRI 03/07/2013.  FINDINGS: Diffuse hindfoot soft tissue swelling and air in the soft tissues worrisome for infection (gas gangrene). There is an open wound on the lateral aspect of the foot near the base of the fifth metatarsal. Findings suspicious for osteomyelitis.  IMPRESSION: Diffuse hindfoot swelling and gas in the soft tissues worrisome  for infection (gas gangrene).  Open wound near the base of the fifth metatarsal with underlying osteomyelitis.   Electronically Signed   By: Kalman Jewels M.D.   On: 12/03/2013 18:25     Assessment and Plan  53 yo male with history of uncontrolled DM, HTN, HL, tobacco abuse has had multiple amputations of toes. During his last clinic visit, Dr. Jess Barters recommendation was to perform a BKA due to his ischemic, nonhealing ulcers and location of ulcers. Patient was resistant to this and opted for abx instead. He underwent amputation of L foot first ray and left 2nd toe 11/21/13. He presents back 12/03/2013 with worsening pain, pus, and was found to be hypotensive/septic and with PAF-RVR (newly recognized).  1. Severe sepsis/hypotension from osteomyelitis with gas gangrene from R-foot infected ulcers 2. Paroxysmal atrial fibrillation with RVR,  3. PAD by ABIs 2014 4. AKI, resolved 5. Diabetes mellitus, uncontrolled, A1C 14 in 09/2013 6. HTN 7. Ongoing tobacco abuse, prior h/o EtOH/cocaine abuse  Likely trigger for atrial fibrillation is the increased adrenaline state of his infection. CHADSVASC = tentatively 3 for DM, HTN, vascular disease but not yet started on anticoagulation or aspirin given impending ortho surgery (possibly today per notes). Would start heparin when OK with primary teams before making a decision about oral anticoagulation. Care mgmt consult to evaluate cost of NOAC vs Coumadin. TSH wnl. 2D echo pending. BP too soft for BB or CCB at present - will need to reconsider once SIRS resolved. Can consider digoxin or amiodarone if he goes back into rapid AF. Will defer lyte management to primary team. Regarding PVD, may benefit from vascular f/u although this may be futile if DM continues to be uncontrolled with ongoing smoking. At some point may need ischemic workup for risk stratification given known vascular disease and multiple cardiac risk factors. He denies current angina.  Will also check  UDS given h/o cocaine abuse (UDS + 2010).  Signed, Melina Copa PA-C As above, patient seen and examined. He has converted to sinus rhythm. Await echocardiogram; TSH normal. He has multiple embolic risk factors including diabetes mellitus, hypertension and vascular disease. Once all procedures are complete he will need long-term anticoagulation. I will add low-dose metoprolol. We will plan a functional study once sepsis/infection resolves. This could be done as an outpatient. Kirk Ruths

## 2013-12-04 NOTE — H&P (Signed)

## 2013-12-04 NOTE — Progress Notes (Signed)
Echo Lab  2D Echocardiogram completed.  Rilley Stash L Jordin Vicencio, RDCS 12/04/2013 10:50 AM

## 2013-12-04 NOTE — Progress Notes (Signed)
FMTS ATTENDING  NOTE Ronette Deter  I have discussed this patient with the admitting attending and the resident. I agree with their findings, assessment and care plan.

## 2013-12-05 ENCOUNTER — Encounter (HOSPITAL_COMMUNITY): Payer: Self-pay | Admitting: Orthopaedic Surgery

## 2013-12-05 DIAGNOSIS — F1410 Cocaine abuse, uncomplicated: Secondary | ICD-10-CM | POA: Diagnosis present

## 2013-12-05 LAB — GLUCOSE, CAPILLARY
GLUCOSE-CAPILLARY: 283 mg/dL — AB (ref 70–99)
GLUCOSE-CAPILLARY: 197 mg/dL — AB (ref 70–99)
Glucose-Capillary: 359 mg/dL — ABNORMAL HIGH (ref 70–99)
Glucose-Capillary: 333 mg/dL — ABNORMAL HIGH (ref 70–99)
Glucose-Capillary: 124 mg/dL — ABNORMAL HIGH (ref 70–99)
Glucose-Capillary: 246 mg/dL — ABNORMAL HIGH (ref 70–99)

## 2013-12-05 LAB — CBC
HCT: 31.6 % — ABNORMAL LOW (ref 39.0–52.0)
Hemoglobin: 10.4 g/dL — ABNORMAL LOW (ref 13.0–17.0)
MCH: 28.5 pg (ref 26.0–34.0)
MCHC: 32.9 g/dL (ref 30.0–36.0)
MCV: 86.6 fL (ref 78.0–100.0)
PLATELETS: 375 10*3/uL (ref 150–400)
RBC: 3.65 MIL/uL — AB (ref 4.22–5.81)
RDW: 14.5 % (ref 11.5–15.5)
WBC: 11.5 10*3/uL — ABNORMAL HIGH (ref 4.0–10.5)

## 2013-12-05 LAB — CORTISOL-AM, BLOOD: CORTISOL - AM: 11.1 ug/dL (ref 4.3–22.4)

## 2013-12-05 MED ORDER — DILTIAZEM HCL 30 MG PO TABS
30.0000 mg | ORAL_TABLET | Freq: Four times a day (QID) | ORAL | Status: DC
  Administered 2013-12-05 – 2013-12-07 (×10): 30 mg via ORAL
  Filled 2013-12-05 (×13): qty 1

## 2013-12-05 MED ORDER — RIVAROXABAN 20 MG PO TABS
20.0000 mg | ORAL_TABLET | Freq: Every day | ORAL | Status: DC
  Administered 2013-12-05 – 2013-12-08 (×4): 20 mg via ORAL
  Filled 2013-12-05 (×5): qty 1

## 2013-12-05 MED ORDER — VITAMIN B-1 100 MG PO TABS
100.0000 mg | ORAL_TABLET | Freq: Every day | ORAL | Status: DC
  Administered 2013-12-06 – 2013-12-09 (×4): 100 mg via ORAL
  Filled 2013-12-05 (×4): qty 1

## 2013-12-05 MED ORDER — RIVAROXABAN 20 MG PO TABS: 20.0000 mg | ORAL_TABLET | Freq: Every day | ORAL | Status: DC

## 2013-12-05 MED ORDER — LIVING WELL WITH DIABETES BOOK
Freq: Once | Status: AC
  Administered 2013-12-05: 11:00:00
  Filled 2013-12-05: qty 1

## 2013-12-05 MED ORDER — INSULIN ASPART 100 UNIT/ML ~~LOC~~ SOLN: 4.0000 [IU] | Freq: Three times a day (TID) | SUBCUTANEOUS | Status: DC

## 2013-12-05 MED ORDER — FOLIC ACID 1 MG PO TABS
1.0000 mg | ORAL_TABLET | Freq: Every day | ORAL | Status: DC
  Administered 2013-12-06 – 2013-12-09 (×4): 1 mg via ORAL
  Filled 2013-12-05 (×4): qty 1

## 2013-12-05 MED ORDER — INSULIN GLARGINE 100 UNIT/ML ~~LOC~~ SOLN
30.0000 [IU] | Freq: Every day | SUBCUTANEOUS | Status: DC
  Administered 2013-12-05 – 2013-12-08 (×4): 30 [IU] via SUBCUTANEOUS
  Filled 2013-12-05 (×6): qty 0.3

## 2013-12-05 MED ORDER — PANTOPRAZOLE SODIUM 40 MG PO TBEC
40.0000 mg | DELAYED_RELEASE_TABLET | Freq: Every day | ORAL | Status: DC
  Administered 2013-12-05 – 2013-12-08 (×4): 40 mg via ORAL
  Filled 2013-12-05 (×4): qty 1

## 2013-12-05 NOTE — Progress Notes (Signed)
   Subjective:  Patient reports pain as mild.  afib resolved. No signs of sepsis  Objective:   VITALS:   Filed Vitals:   12/04/13 2320 12/05/13 0028 12/05/13 0314 12/05/13 0728  BP: 142/84 138/87 142/76 126/75  Pulse: 89 131 89 83  Temp: 98.5 F (36.9 C)  98.8 F (37.1 C) 99.3 F (37.4 C)  TempSrc: Oral  Oral Oral  Resp: 19 13 20 11   Height:      Weight:      SpO2: 100% 98% 99% 98%    AFVSS Dressing c/d/i HVAC in place   Lab Results  Component Value Date   WBC 11.5* 12/05/2013   HGB 10.4* 12/05/2013   HCT 31.6* 12/05/2013   MCV 86.6 12/05/2013   PLT 375 12/05/2013     Assessment/Plan:  1 Day Post-Op   - Expected postop acute blood loss anemia - will monitor for symptoms - Up with PT/OT - likely needs CIR - NWB right lower extremity - recommend aspirin 325 BID for DVT prophylaxis but will defer to primary team vs cards - may start anticoagulation if needed for afib - HVAC removed  Marianna Payment 12/05/2013, 8:03 AM [phone]

## 2013-12-05 NOTE — Progress Notes (Addendum)
Per care management, Xarelto is preferred NOAC by insurance and will be $3 without prior authorization. Will start this today - per ortho "may start anticoagulation if needed for afib." Normal CrCl, so dose will be 20mg  qsupper. Have requested that first dose be started this morning then we can start qsupper dosing. Dayna Dunn PA-C

## 2013-12-05 NOTE — Progress Notes (Signed)
Family Medicine Teaching Service Daily Progress Note Intern Pager: [phone]  Patient name: Steven [name] Medical record number: [id number] Date of birth: January 07, 1961 Age: 53 y.o. 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 male presenting with severe sepsis due to worsening infected Left-foot DM ulcers, found to have osteomyelitis and gas-gangrene, additionally found to be in new onset AFib with RVR. PMH is significant for DM-2 (poorly controlled, complicated peripheral neuropathy), multiple bilateral toe amputations (Dr. Sharol Given) with h/o osteomyelitis, HTN, PVD.   Osteomyelitis with gas gangrene: POD #1 s/p transtibial amputation of RLE - Follow-up blood culture x 2 (collected 6/24): NGTD - Vancomycin, zosyn, clindamycin (6/24- ) Will likely D/C this if blood cultures remain negative another 24 hours.  - Pain control: Fentanyl 25-67mg IV q 2 hr PRN, Tylenol, Gabapentin (home) - cautious with hypotension  - Leukocytosis improving 14.5 > 12.6 > 11.5 - NWB RLE, up with PT/OT  Sepsis: Resolved  Paroxysmal atrial fibrillation with RVR, unknown duration: Transient SVT o/n - Adding diltiazem [id number], avoid BB as cocaine + - CHADs-VASC score 3 for DM, HTN, PAD: Starting xarelto per cardiology - Echocardiogram: EF 55-60%, no wall motion abnormalities - Ischemic work up as outpatient  Acute blood loss anemia: Stable from yesterday 14.6 > 10.5 > 10.4 - Monitor with daily CBC  PAD: shown with ABIs 2014  Acute Kidney Injury: Resolved - Hold ACEi, nephrotoxic agents   T2DM, uncontrolled: Hb A1c 14.6% - Restart 30u tonight - moderate SSI  - Monitor insulin requirement on hospital diet and titrate  as necessary  Hyponatremia, hypovolemic: Improving - Na 130 > 135 - Monitor BMET   Hypokalemia, mild: K 3.4, repleted in IVF  HTN  - Hold home anti-HTN agents (Lisinopril - HCTZ) due to low BPs - Started dilt as above  HLD  - Resume home statin   Tobacco abuse  - Active smoker 0.5ppd, >30 yrs  - Nicotine patch 1469maily   FEN/GI: Saline lock IV / Carb-modified diet Prophylaxis: Xarelto  Disposition: Start anti-coagulation, continued monitoring.   Subjective:  Dislikes the low carbohydrate breakfast, reports pain is uncontrolled even right after getting medication. He denies SOB, CP, palpitations.  Objective: Temp:  [97.9 F (36.6 C)-99.3 F (37.4 C)] 99.3 F (37.4 C) (06/26 0728) Pulse Rate:  [76-153] 83 (06/26 0728) Resp:  [11-23] 11 (06/26 0728) BP: (119-145)/(69-96) 126/75 mmHg (06/26 0728) SpO2:  [93 %-100 %] 98 % (06/26 0728) Physical Exam: General: [age]o. male resting quietly in NAD Cardiovascular: RRR no murmur, NSR on ECG Respiratory: Non-labored, coarse breath sounds bilaterally Abdomen: +BS, soft, NT, ND Extremities: LLE: healing wound without evidence of infection; RLE s/p transtibial amputation, bandage in place c/d/i  Laboratory:  Recent Labs Lab 12/03/13 1701 12/04/13 0525 12/05/13 0305  WBC 14.5* 12.6* 11.5*  HGB 14.6 10.5* 10.4*  HCT 41.7 31.1* 31.6*  PLT 404* 424* 375    Recent Labs Lab 12/03/13 1701 12/04/13 0525  NA 130* 135*  K 3.8 3.4*  CL 87* 97  CO2 26 24  BUN 31* 19  CREATININE 1.23 0.85  CALCIUM 10.6* 8.6  GLUCOSE 226* 144*   Lactic Acid - 1.89  Troponin - negative x1  ESR - 120 CRP - pending TSH -  0.457 Cortisol - pending HgbA1c - pending  Blood Cultures (x 2, collected 12/03/13)   6/24 CXR 2v  FINDINGS:  Low lung volumes. The heart size and mediastinal contours are within  normal limits. Both lungs are clear. The visualized skeletal  structures are unremarkable.  IMPRESSION:  No active cardiopulmonary  disease.   6/24 Right Foot, complete  IMPRESSION:  Diffuse hindfoot swelling and gas in the soft tissues worrisome for  infection (gas gangrene).  Open wound near the base of the fifth metatarsal with underlying  osteomyelitis.  Vance Gather, MD 12/05/2013, 8:47 AM PGY-1, [place] Intern pager: [phone], text pages welcome

## 2013-12-05 NOTE — Progress Notes (Signed)
Discussed with P.T. Pt with Medicaid [place] [facility]. Limited therapy options at home or in SNF. Would like to assess if pt would be agreeable to an inpt rehab admission if rehab MD felt appropriate. Noted Dr. Erlinda Hong states pt likely will need CIR admission. I will order and we will assess on Monday if he will be a candidate. Please call me with any questions. [phone]

## 2013-12-05 NOTE — Evaluation (Signed)
Physical Therapy Evaluation Patient Details Name: Steven [name] MRN: [id number] DOB: 1960-08-05 Today's Date: 12/05/2013   History of Present Illness  pt presents with R BKA and recent amputation of L foot first and second rays.    Clinical Impression  Pt mobility very limited at this point and is unable to tell PT if he has any WBing restrictions with recent L toe amputations. Pt states he lives alone at this time and his family can only check on him. At this time feel pt will need ST-SNF at D/C, however ? If pt agreeable, not very receptive when PT discussed it. Will continue to follow.      Follow Up Recommendations SNF    Equipment Recommendations   (TBD)    Recommendations for Other Services       Precautions / Restrictions Precautions Precautions: Fall Restrictions Weight Bearing Restrictions: Yes RLE Weight Bearing: Non weight bearing LLE Weight Bearing: Weight bearing as tolerated Other Position/Activity Restrictions: L LE WBAT according to old notes, pt has no recollection of any WBing status.        Mobility  Bed Mobility                  Transfers Overall transfer level: Needs assistance Equipment used: None Transfers: Squat Pivot Transfers     Squat pivot transfers: Min assist     General transfer comment: pt sitting on 3-in-1 on arrival.  pt able to squat pivot towards L side with heavy reliance on UEs.  Attempted to give pt cueing for technique, however pt does not want cueing and states "Just hold your thought.  I'm gonna figure this out."    Ambulation/Gait                Stairs            Wheelchair Mobility    Modified Rankin (Stroke Patients Only)       Balance Overall balance assessment: Needs assistance Sitting-balance support: No upper extremity supported;Feet supported Sitting balance-Leahy Scale: Good     Standing balance support: Bilateral upper extremity supported Standing balance-Leahy Scale: Zero                                Pertinent Vitals/Pain R residual limb painful, but did not rate when asked.      Home Living Family/patient expects to be discharged to:: Unsure Living Arrangements: Alone Available Help at Discharge: Friend(s) (states he can call friends to help as needed) Type of Home: Apartment Home Access: Level entry     Home Layout: One level Home Equipment: Walker - 2 wheels;Cane - single point;Wheelchair - power      Prior Function Level of Independence: Needs assistance   Gait / Transfers Assistance Needed: Using crutches per pt, however during last admit was using RW.    ADL's / Homemaking Assistance Needed: pt states he has "a lady" who cleans for him.          Hand Dominance   Dominant Hand: Right    Extremity/Trunk Assessment   Upper Extremity Assessment: Overall WFL for tasks assessed           Lower Extremity Assessment: RLE deficits/detail;LLE deficits/detail RLE Deficits / Details: New BKA.   LLE Deficits / Details: pt with recent L first and second ray amputations.  pt indicates pain in foot during mobility.       Communication   Communication: No  difficulties  Cognition Arousal/Alertness: Awake/alert Behavior During Therapy: WFL for tasks assessed/performed Overall Cognitive Status: Within Functional Limits for tasks assessed                      General Comments      Exercises        Assessment/Plan    PT Assessment Patient needs continued PT services  PT Diagnosis Difficulty walking;Generalized weakness;Acute pain   PT Problem List Decreased strength;Decreased activity tolerance;Decreased balance;Decreased mobility;Decreased knowledge of use of DME;Decreased knowledge of precautions;Pain  PT Treatment Interventions DME instruction;Gait training;Functional mobility training;Therapeutic activities;Therapeutic exercise;Balance training;Patient/family education   PT Goals (Current goals can be found in the Care  Plan section) Acute Rehab PT Goals Patient Stated Goal: Go home PT Goal Formulation: With patient Time For Goal Achievement: 12/19/13 Potential to Achieve Goals: Good    Frequency Min 3X/week   Barriers to discharge Decreased caregiver support      Co-evaluation               End of Session   Activity Tolerance: Patient tolerated treatment well Patient left: in chair;with call bell/phone within reach Nurse Communication: Mobility status         Time: 9509-3267 PT Time Calculation (min): 21 min   Charges:   PT Evaluation $Initial PT Evaluation Tier I: 1 Procedure PT Treatments $Therapeutic Activity: 8-22 mins   PT G CodesCatarina [name], [place] [phone] 12/05/2013, 3:46 PM

## 2013-12-05 NOTE — Progress Notes (Signed)
Patient: Steven [name] / Admit Date: 12/03/2013 / Date of Encounter: 12/05/2013, 7:34 AM  Subjective  No CP or SOB.  Objective   Telemetry: transient SVT  Physical Exam: Blood pressure 142/76, pulse 89, temperature 98.8 F (37.1 C), temperature source Oral, resp. rate 20, height 6\' 4"  (1.93 m), weight 211 lb 6.7 oz (95.9 kg), SpO2 99.00%. General: Well developed, well nourished, in no acute distress. Head: Normal Neck: Supple Lungs: CTA  Heart: RRR Abdomen: Soft, non-tender, non-distended  Extremities: s/p right BKA Neuro: Grossly intact   Intake/Output Summary (Last 24 hours) at 12/05/13 0734 Last data filed at 12/05/13 0643  Gross per 24 hour  Intake 3485.5 ml  Output   2500 ml  Net  985.5 ml    Inpatient Medications:  . atorvastatin  40 mg Oral Daily  . clindamycin (CLEOCIN) IV  600 mg Intravenous 3 times per day  . folic acid  1 mg Intravenous Daily  . gabapentin  300 mg Oral BID WC   And  . gabapentin  600 mg Oral QHS  . insulin aspart  0-15 Units Subcutaneous TID WC  . insulin aspart  0-5 Units Subcutaneous QHS  . insulin glargine  15 Units Subcutaneous QHS  . nicotine  14 mg Transdermal Daily  . pantoprazole (PROTONIX) IV  40 mg Intravenous Q24H  . piperacillin-tazobactam (ZOSYN)  IV  3.375 g Intravenous Q8H  . sodium chloride  3 mL Intravenous Q12H  . thiamine  100 mg Intravenous Daily  . vancomycin  1,000 mg Intravenous Q8H   Infusions:  . 0.9 % NaCl with KCl 40 mEq / L 150 mL/hr (12/04/13 1245)  . lactated ringers 10 mL/hr at 12/04/13 1452    Labs:  Recent Labs  12/03/13 1701 12/04/13 0525  NA 130* 135*  K 3.8 3.4*  CL 87* 97  CO2 26 24  GLUCOSE 226* 144*  BUN 31* 19  CREATININE 1.23 0.85  CALCIUM 10.6* 8.6   Recent Labs  12/03/13 1701 12/04/13 0525 12/05/13 0305  WBC 14.5* 12.6* 11.5*  NEUTROABS 10.5*  --   --   HGB 14.6 10.5* 10.4*  HCT 41.7 31.1* 31.6*  MCV 84.4 84.5 86.6  PLT 404* 424* 375    Recent Labs  12/03/13 1701    TROPONINI <0.30    Recent Labs  12/04/13 0525  HGBA1C 14.6*     Radiology/Studies:  Dg Chest 2 View  12/03/2013   CLINICAL DATA:  chest pain  EXAM: CHEST  2 VIEW  COMPARISON:  Two-view chest radiograph 11/21/2013  FINDINGS: Low lung volumes. The heart size and mediastinal contours are within normal limits. Both lungs are clear. The visualized skeletal structures are unremarkable.  IMPRESSION: No active cardiopulmonary disease.   Electronically Signed   By: Margaree Mackintosh M.D.   On: 12/03/2013 18:26   Dg Chest 2 View  11/21/2013   CLINICAL DATA:  Osteomyelitis left great toe.  Diabetes.  EXAM: CHEST  2 VIEW  COMPARISON:  04/24/2012  FINDINGS: Heart is normal size. No confluent airspace opacities or effusions. No acute bony abnormality.  IMPRESSION: No active disease.   Electronically Signed   By: Rolm Baptise M.D.   On: 11/21/2013 12:57   Dg Foot Complete Right  12/03/2013   CLINICAL DATA:  Pain and swelling.  EXAM: RIGHT FOOT COMPLETE - 3+ VIEW  COMPARISON:  Radiographs 08/30/2012 and MRI 03/07/2013.  FINDINGS: Diffuse hindfoot soft tissue swelling and air in the soft tissues worrisome for infection (gas gangrene).  There is an open wound on the lateral aspect of the foot near the base of the fifth metatarsal. Findings suspicious for osteomyelitis.  IMPRESSION: Diffuse hindfoot swelling and gas in the soft tissues worrisome for infection (gas gangrene).  Open wound near the base of the fifth metatarsal with underlying osteomyelitis.   Electronically Signed   By: Kalman Jewels M.D.   On: 12/03/2013 18:25     Assessment and Plan  53 yo male with history of uncontrolled DM, HTN, HL, tobacco abuse has had multiple amputations of toes. During his last clinic visit, Dr. Jess Barters recommendation was to perform a BKA due to his ischemic, nonhealing ulcers and location of ulcers. Patient was resistant to this and opted for abx instead. He presents back 12/03/2013 with worsening pain, pus, and was found to  be hypotensive/septic and with PAF-RVR (newly recognized). Converted to sinus spontaneously  1. Severe sepsis/hypotension from osteomyelitis with gas gangrene from R-foot infected ulcers; now s/p BKA 2. Paroxysmal atrial fibrillation with RVR now sinus 3. PAD by ABIs 2014 4. AKI, resolved 5. Diabetes mellitus, uncontrolled, A1C 14 in 09/2013 6. HTN 7. Ongoing tobacco abuse 8. Drug screen positive for cocaine.  Likely trigger for atrial fibrillation is the increased adrenaline state of his infection. CHADSVASC = 3 for DM, HTN, vascular disease. Add apixaban 5 BID when ok with surgery. TSH wnl. Echo with normal LV function. Recurrent brief SVT last PM; add cardizem 30 mg q 6; change to CD in AM if stable; no beta blocker in setting of cocaine. Will need ischemic workup for risk stratification given known vascular disease and multiple cardiac risk factors as outpatient once he recovers from surgery.   Kirk Ruths

## 2013-12-05 NOTE — Progress Notes (Signed)
FMTS ATTENDING  NOTE [name],MD I  have seen and examined this patient, reviewed their chart. I have discussed this patient with the resident. I agree with the resident's findings, assessment and care plan. 

## 2013-12-05 NOTE — Progress Notes (Signed)
ANTIBIOTIC CONSULT NOTE - FOLLOW UP  Pharmacy Consult for vancomycin, zosyn Indication: Osteomyelitis    No Known Allergies  Patient Measurements: Height: 6\' 4"  (193 cm) Weight: 211 lb 6.7 oz (95.9 kg) IBW/kg (Calculated) : 86.8  Vital Signs: Temp: 99.3 F (37.4 C) (06/26 0728) Temp src: Oral (06/26 0728) BP: 126/75 mmHg (06/26 0728) Pulse Rate: 83 (06/26 0728) Intake/Output from previous day: 06/25 0701 - 06/26 0700 In: 3635.5 [P.O.:720; I.V.:2553; IV Piggyback:362.5] Out: 2500 [Urine:2300; Blood:200] Intake/Output from this shift:    Labs:  Recent Labs  12/03/13 1701 12/04/13 0525 12/05/13 0305  WBC 14.5* 12.6* 11.5*  HGB 14.6 10.5* 10.4*  PLT 404* 424* 375  CREATININE 1.23 0.85  --    Estimated Creatinine Clearance: 120.6 ml/min (by C-G formula based on Cr of 0.85). No results found for this basename: VANCOTROUGH, VANCOPEAK, VANCORANDOM, GENTTROUGH, GENTPEAK, GENTRANDOM, TOBRATROUGH, TOBRAPEAK, TOBRARND, AMIKACINPEAK, AMIKACINTROU, AMIKACIN,  in the last 72 hours   Microbiology: Recent Results (from the past 720 hour(s))  CULTURE, BLOOD (ROUTINE X 2)     Status: None   Collection Time    12/03/13  5:01 PM      Result Value Ref Range Status   Specimen Description BLOOD LEFT HAND   Final   Special Requests BOTTLES DRAWN AEROBIC ONLY 5CC   Final   Culture  Setup Time     Final   Value: 12/03/2013 22:06     Performed at [facility]   Culture     Final   Value:        BLOOD CULTURE RECEIVED NO GROWTH TO DATE CULTURE WILL BE HELD FOR 5 DAYS BEFORE ISSUING A FINAL NEGATIVE REPORT     Performed at [facility]   Report Status PENDING   Incomplete  CULTURE, BLOOD (ROUTINE X 2)     Status: None   Collection Time    12/03/13  5:21 PM      Result Value Ref Range Status   Specimen Description BLOOD RIGHT ARM   Final   Special Requests BOTTLES DRAWN AEROBIC AND ANAEROBIC 10CC   Final   Culture  Setup Time     Final   Value: 12/04/2013 01:04   Performed at [facility]   Culture     Final   Value:        BLOOD CULTURE RECEIVED NO GROWTH TO DATE CULTURE WILL BE HELD FOR 5 DAYS BEFORE ISSUING A FINAL NEGATIVE REPORT     Performed at [facility]   Report Status PENDING   Incomplete  MRSA PCR SCREENING     Status: None   Collection Time    12/03/13 10:54 PM      Result Value Ref Range Status   MRSA by PCR NEGATIVE  NEGATIVE Final   Comment:            The GeneXpert MRSA Assay (FDA     approved for NASAL specimens     only), is one component of a     comprehensive MRSA colonization     surveillance program. It is not     intended to diagnose MRSA     infection nor to guide or     monitor treatment for     MRSA infections.    Anti-infectives   Start     Dose/Rate Route Frequency Ordered Stop   12/04/13 2300  piperacillin-tazobactam (ZOSYN) IVPB 3.375 g     3.375 g 12.5 mL/hr over 240 Minutes  Intravenous Every 8 hours 12/04/13 1731     12/04/13 1545  piperacillin-tazobactam (ZOSYN) IVPB 3.375 g     3.375 g 12.5 mL/hr over 240 Minutes Intravenous To Surgery 12/04/13 1539 12/04/13 1930   12/04/13 0400  piperacillin-tazobactam (ZOSYN) IVPB 3.375 g  Status:  Discontinued     3.375 g 12.5 mL/hr over 240 Minutes Intravenous 3 times per day 12/03/13 1659 12/03/13 1705   12/04/13 0200  piperacillin-tazobactam (ZOSYN) IVPB 3.375 g  Status:  Discontinued     3.375 g 12.5 mL/hr over 240 Minutes Intravenous 3 times per day 12/03/13 1705 12/04/13 1731   12/03/13 2200  clindamycin (CLEOCIN) IVPB 600 mg     600 mg 100 mL/hr over 30 Minutes Intravenous 3 times per day 12/03/13 2049     12/03/13 1800  vancomycin (VANCOCIN) IVPB 1000 mg/200 mL premix     1,000 mg 200 mL/hr over 60 Minutes Intravenous Every 8 hours 12/03/13 1708     12/03/13 1730  piperacillin-tazobactam (ZOSYN) IVPB 4.5 g     4.5 g 200 mL/hr over 30 Minutes Intravenous  Once 12/03/13 1656 12/03/13 1841      Assessment: 53 yo male with 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 WBC= 11.5 (decreased), afeb, SCr= 0.85 and CrCl > 100. Noted MD plans for d/c antibiotics if cultures remain negative. Patient also noted on Xarelto for afib.   Vanc 6/24>> Zosyn 6/24>>  Clinda per MD 6/25>>  6/24 blood x 2 - ngtd 6/24 MRSA screen neg   Goal of Therapy:  Vancomycin trough level 15-20 mcg/ml  Plan:  -No vancomycin/zosyn dose changes needed -Could consider d/c clindamycin? -Will follow renal function, cultures and clinical progress -Will consider a vancomycin trough if therapy continues  Hildred Laser, Pharm D 12/05/2013 11:20 AM

## 2013-12-05 NOTE — Progress Notes (Addendum)
Inpatient Diabetes Program Recommendations  AACE/ADA: New Consensus Statement on Inpatient Glycemic Control (2013)  Target Ranges:  Prepandial:   less than 140 mg/dL      Peak postprandial:   less than 180 mg/dL (1-2 hours)      Critically ill patients:  140 - 180 mg/dL     Results for DAITON, COWLES (MRN [id number]) as of 12/05/2013 07:33  Ref. Range 12/04/2013 05:25  Hemoglobin A1C Latest Range: <5.7 % 14.6 (H)    Results for ISHAAQ, PENNA (MRN [id number]) as of 12/05/2013 07:33  Ref. Range 12/04/2013 23:22 12/05/2013 06:02  Glucose-Capillary Latest Range: 70-99 mg/dL 283 (H) 359 (H)     **POD#1 Transtibial amputation for gangrene/osteomyelitis.  **Note Lantus 15 units QHS restarted last PM along with Novolog Moderate SSI.  AM CBG today 359 mg/dl.  **DM Coordinator to see patient today to discuss elevated A1c and need for better CBG control at home    MD- Please consider the following to improve CBG control to optimize healing:  1. Increase Lantus to Home dose of 30 units QHS 2. Add Novolog Meal Coverage- Novolog 4 units tid with meals 3. Patient likely needs rapid-acting insulin like Novolog or Humalog at home for better control of CBGs   Addendum 1000am:  Spoke with patient about his A1c of 14.6%.  Explained what an A1c is and what it measures.  Reminded patient that his goal A1c is 7% or less per ADA standards to prevent both acute and long-term complications.  Encouraged patient to check his CBGs at least tid at home and to record all CBGs in a logbook for his PCP to review.  Patient stated to me that he didn't realize he had DM.  I asked patient to clarify what he meant and patient than stated that he knows he has diabetes but didn't realize he had DM until he already had problems.  I asked patient if he has ever been for Outpatient DM education and patient stated he has but it was a while ago.  I offered to make a referral to the Nutrition and DM management center for patient  after d/c so that patient can follow up with a Certified DM educator after d/c for further education about his disease.  Patient made a comment that that center doesn't take Medicaid and then he made several other unintelligible comments about the fact that he has Medicaid.  I reassured patient that the Nutrition and DM center does take Medicaid coverage and that I will place the referral for him. The center will contact him after d/c for an appointment and he can choose an appointment date at that time.  Patient complained a lot about the food he has received here in the hospital.  Explained to patient that he is on a carb modified diet and that we must limit his carbohydrate consumption to better control his CBGs.  Reminded patient that our goal is to optimize his blood sugar control to allow for quicker healing.  Also discussed with patient that the physicians may send him home on meal time insulin to improve his CBG control at home.  Reminded patient that it is imperative that he closely follow his PCP at the [facility] to get his CBGs under better control.  Patient stated he wanted to do this, but I am unsure how motivated he is at this time.     Will follow Wyn Quaker RN, MSN, CDE Diabetes Coordinator Inpatient Diabetes Program Team  Pager: [phone] (8a-10p)

## 2013-12-06 DIAGNOSIS — IMO0001 Reserved for inherently not codable concepts without codable children: Secondary | ICD-10-CM

## 2013-12-06 DIAGNOSIS — F1410 Cocaine abuse, uncomplicated: Secondary | ICD-10-CM

## 2013-12-06 DIAGNOSIS — F1010 Alcohol abuse, uncomplicated: Secondary | ICD-10-CM

## 2013-12-06 DIAGNOSIS — M86179 Other acute osteomyelitis, unspecified ankle and foot: Secondary | ICD-10-CM

## 2013-12-06 DIAGNOSIS — I10 Essential (primary) hypertension: Secondary | ICD-10-CM

## 2013-12-06 DIAGNOSIS — E1165 Type 2 diabetes mellitus with hyperglycemia: Secondary | ICD-10-CM

## 2013-12-06 DIAGNOSIS — E785 Hyperlipidemia, unspecified: Secondary | ICD-10-CM

## 2013-12-06 LAB — BASIC METABOLIC PANEL
BUN: 4 mg/dL — ABNORMAL LOW (ref 6–23)
CHLORIDE: 98 meq/L (ref 96–112)
CO2: 21 mEq/L (ref 19–32)
Calcium: 8.6 mg/dL (ref 8.4–10.5)
Creatinine, Ser: 0.68 mg/dL (ref 0.50–1.35)
GFR calc Af Amer: >90 mL/min (ref >90)
GFR calc non Af Amer: >90 mL/min (ref >90)
GLUCOSE: 163 mg/dL — AB (ref 70–99)
POTASSIUM: 4.7 meq/L (ref 3.7–5.3)
SODIUM: 132 meq/L — AB (ref 137–147)

## 2013-12-06 LAB — GLUCOSE, CAPILLARY
GLUCOSE-CAPILLARY: 156 mg/dL — AB (ref 70–99)
GLUCOSE-CAPILLARY: 139 mg/dL — AB (ref 70–99)
Glucose-Capillary: 115 mg/dL — ABNORMAL HIGH (ref 70–99)
Glucose-Capillary: 90 mg/dL (ref 70–99)
Glucose-Capillary: 203 mg/dL — ABNORMAL HIGH (ref 70–99)

## 2013-12-06 LAB — CBC
HEMATOCRIT: 30.4 % — AB (ref 39.0–52.0)
HEMOGLOBIN: 10.1 g/dL — AB (ref 13.0–17.0)
MCH: 28.9 pg (ref 26.0–34.0)
MCHC: 33.2 g/dL (ref 30.0–36.0)
MCV: 86.9 fL (ref 78.0–100.0)
Platelets: 370 10*3/uL (ref 150–400)
RBC: 3.5 MIL/uL — AB (ref 4.22–5.81)
RDW: 14.8 % (ref 11.5–15.5)
WBC: 11.1 10*3/uL — AB (ref 4.0–10.5)

## 2013-12-06 MED ORDER — CLINDAMYCIN HCL 300 MG PO CAPS
300.0000 mg | ORAL_CAPSULE | Freq: Four times a day (QID) | ORAL | Status: DC
  Administered 2013-12-06 – 2013-12-08 (×8): 300 mg via ORAL
  Filled 2013-12-06 (×12): qty 1

## 2013-12-06 NOTE — Progress Notes (Signed)
Subjective:  Complains of pain in his leg. No chest pain or shortness of breath. No recurrent atrial fibrillation overnight.  Objective:  Vital Signs in the last 24 hours: BP 128/75  Pulse 83  Temp(Src) 98.9 F (37.2 C) (Oral)  Resp 11  Ht 6\' 4"  (1.93 m)  Wt 95.9 kg (211 lb 6.7 oz)  BMI 25.75 kg/m2  SpO2 97%  Physical Exam: Middle-aged black male in no acute distress Lungs:  Clear  Cardiac:  Regular rhythm, normal S1 and S2, no S3  Intake/Output from previous day: 06/26 0701 - 06/27 0700 In: 3825 [P.O.:720; I.V.:3300; IV Piggyback:300] Out: 2350 [Urine:2350] Weight Filed Weights   12/03/13 1611 12/03/13 2251  Weight: 94.2 kg (207 lb 10.8 oz) 95.9 kg (211 lb 6.7 oz)    Lab Results: Basic Metabolic Panel:  Recent Labs  12/04/13 0525 12/06/13 0239  NA 135* 132*  K 3.4* 4.7  CL 97 98  CO2 24 21  GLUCOSE 144* 163*  BUN 19 4*  CREATININE 0.85 0.68    CBC:  Recent Labs  12/03/13 1701  12/05/13 0305 12/06/13 0239  WBC 14.5*  < > 11.5* 11.1*  NEUTROABS 10.5*  --   --   --   HGB 14.6  < > 10.4* 10.1*  HCT 41.7  < > 31.6* 30.4*  MCV 84.4  < > 86.6 86.9  PLT 404*  < > 375 370  < > = values in this interval not displayed.  PROTIME: Lab Results  Component Value Date   INR 1.05 11/21/2013   INR 0.88 04/22/2013   INR 0.99 04/24/2012    Telemetry: Sinus rhythm overnight, no recurrent SVT or atrial fibrillation  Assessment/Plan:  1. Paroxysmal atrial fibrillation with rapid ventricular response 2. Prior severe sepsis and hypotension 3. Drug screen positive for cocaine  Recommendations:  Continue diltiazem. XARELTO started last night.   Kerry Hough  MD [facility]  12/06/2013, 10:29 AM

## 2013-12-06 NOTE — Progress Notes (Signed)
Pt arrived to the 6E20 via bed. Pt alert and oriented x4. NWB. Has a compression dressing on his right stump clean dry and intact. Denies any pain on assessment. VS stable. Pt oriented to staff and unit. Will cont to monitor.

## 2013-12-06 NOTE — Progress Notes (Signed)
Pt transferred via bed with all belongings to 6E20. VSS. Report given and all questions answered.

## 2013-12-06 NOTE — Progress Notes (Signed)
Family Medicine Teaching Service Daily Progress Note Intern Pager: [phone]  Patient name: Steven [name] Medical record number: [id number] Date of birth: January 27, 1961 Age: 53 y.o. 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 male presenting with severe sepsis due to worsening infected Left-foot DM ulcers, found to have osteomyelitis and gas-gangrene, additionally found to be in new onset AFib with RVR. PMH is significant for DM-2 (poorly controlled, complicated peripheral neuropathy), multiple bilateral toe amputations (Dr. Sharol Given) with h/o osteomyelitis, HTN, PVD.   Osteomyelitis with gas gangrene: POD #2 s/p transtibial amputation of RLE - Follow-up blood culture x 2 (collected 6/24): NGTD - Vancomycin, zosyn, clindamycin (6/24-6/27 ), de-escalate to PO clindamycin - Pain control: Fentanyl 25-57mg IV q 2 hr PRN, oxycodone 15 mg q 3PRN Tylenol, Gabapentin (home) - cautious with hypotension  - Leukocytosis improving 14.5 > 12.6 > 11.5>11.1 - Blood cultures NGTD - NWB RLE, up with PT/OT  Sepsis: Resolved  Paroxysmal atrial fibrillation with RVR, unknown duration: Transient SVT o/n - continue diltiazem [id number], avoid BB as cocaine + - CHADs-VASC score 3 for DM, HTN, PAD: continue xarelto per cardiology - Echocardiogram: EF 55-60%, no wall motion abnormalities - Ischemic work up as outpatient  Acute blood loss anemia: Stable from yesterday 14.6 > 10.5 > 10.4 - Monitor with daily CBC  PAD: shown with ABIs 2014  Acute Kidney Injury: Resolved - Hold ACEi, nephrotoxic agents   T2DM, uncontrolled: Hb A1c 14.6% - CBGs mostly 100s, range 124-333 -  Continue lantus 30u, titrate up according to novolog need - moderate SSI  - Monitor insulin requirement on hospital diet and titrate as necessary  Hyponatremia, hypovolemic: stable - Na 130 > 135>132 - Monitor BMET   Hypokalemia, mild: Resolved, K 4.7  HTN  - Hold home anti-HTN agents (Lisinopril - HCTZ) due to low BPs - Started dilt as above  HLD  - Resume home statin   Tobacco abuse  - Active smoker 0.5ppd, >30 yrs  - Nicotine patch 1460maily   FEN/GI: KVO IVF, carb mod diet Prophylaxis: Xarelto  Disposition: transfer to tele  Subjective:  Denies any improvement in pain, even right after Iv pain meds. No other complaints, breathing well.   Objective: Temp:  [98.8 F (37.1 C)-99.9 F (37.7 C)] 98.9 F (37.2 C) (06/27 0700) Pulse Rate:  [83-94] 83 (06/27 0700) Resp:  [10-20] 11 (06/27 0700) BP: (111-131)/(64-89) 128/75 mmHg (06/27 0700) SpO2:  [94 %-98 %] 97 % (06/27 0700) Physical Exam: General: [age]o. male resting quietly in NAD Cardiovascular: RRR no murmur, NSR on ECG Respiratory: Non-labored, slight crackles at the bases BL Abdomen: +BS, soft, NT, ND Extremities: LLE: Clean dry intact bandage placed this am; RLE s/p transtibial amputation, bandage in place c/d/i  Laboratory:  Recent Labs Lab 12/04/13 0525 12/05/13 0305 12/06/13 0239  WBC 12.6* 11.5* 11.1*  HGB 10.5* 10.4* 10.1*  HCT 31.1* 31.6* 30.4*  PLT 424* 375 370    Recent Labs Lab 12/03/13 1701 12/04/13 0525 12/06/13 0239  NA 130* 135* 132*  K 3.8 3.4* 4.7  CL 87* 97 98  CO2 '26 24 21  ' BUN 31* 19 4*  CREATININE 1.23 0.85 0.68  CALCIUM 10.6* 8.6 8.6  GLUCOSE 226* 144* 163*   Lactic Acid - 1.89  Troponin - negative x1  ESR - 120 CRP - 28.7 TSH - 0.457 Cortisol - 11.1 HgbA1c - 14.6 Blood Cultures (x 2, collected 12/03/13)   6/24 CXR 2v  FINDINGS:  Low lung volumes. The heart size and mediastinal contours are within  normal limits. Both lungs are clear. The visualized skeletal   structures are unremarkable.  IMPRESSION:  No active cardiopulmonary disease.   6/24 Right Foot, complete  IMPRESSION:  Diffuse hindfoot swelling and gas in the soft tissues worrisome for  infection (gas gangrene).  Open wound near the base of the fifth metatarsal with underlying  osteomyelitis.  Steven Euler, MD 12/06/2013, 9:15 AM PGY-2, [place] Intern pager: [phone], text pages welcome

## 2013-12-06 NOTE — Progress Notes (Signed)
FMTS ATTENDING  NOTE Steven Eniola,MD I  have seen and examined this patient, reviewed their chart. I have discussed this patient with the resident. I agree with the resident's findings, assessment and care plan. 

## 2013-12-07 DIAGNOSIS — F17200 Nicotine dependence, unspecified, uncomplicated: Secondary | ICD-10-CM

## 2013-12-07 DIAGNOSIS — I872 Venous insufficiency (chronic) (peripheral): Secondary | ICD-10-CM

## 2013-12-07 DIAGNOSIS — I739 Peripheral vascular disease, unspecified: Secondary | ICD-10-CM

## 2013-12-07 DIAGNOSIS — A419 Sepsis, unspecified organism: Secondary | ICD-10-CM

## 2013-12-07 DIAGNOSIS — R6520 Severe sepsis without septic shock: Secondary | ICD-10-CM

## 2013-12-07 DIAGNOSIS — I96 Gangrene, not elsewhere classified: Secondary | ICD-10-CM

## 2013-12-07 LAB — GLUCOSE, CAPILLARY
Glucose-Capillary: 113 mg/dL — ABNORMAL HIGH (ref 70–99)
Glucose-Capillary: 194 mg/dL — ABNORMAL HIGH (ref 70–99)
Glucose-Capillary: 225 mg/dL — ABNORMAL HIGH (ref 70–99)

## 2013-12-07 LAB — CBC
HCT: 28.3 % — ABNORMAL LOW (ref 39.0–52.0)
HEMOGLOBIN: 9.3 g/dL — AB (ref 13.0–17.0)
MCH: 28.4 pg (ref 26.0–34.0)
MCHC: 32.9 g/dL (ref 30.0–36.0)
MCV: 86.3 fL (ref 78.0–100.0)
Platelets: 362 10*3/uL (ref 150–400)
RBC: 3.28 MIL/uL — AB (ref 4.22–5.81)
RDW: 14.6 % (ref 11.5–15.5)
WBC: 11.2 10*3/uL — AB (ref 4.0–10.5)

## 2013-12-07 LAB — BASIC METABOLIC PANEL
BUN: 5 mg/dL — ABNORMAL LOW (ref 6–23)
CO2: 23 meq/L (ref 19–32)
CREATININE: 0.66 mg/dL (ref 0.50–1.35)
Calcium: 9 mg/dL (ref 8.4–10.5)
Chloride: 96 mEq/L (ref 96–112)
GFR calc non Af Amer: >90 mL/min (ref >90)
GLUCOSE: 127 mg/dL — AB (ref 70–99)
POTASSIUM: 4.3 meq/L (ref 3.7–5.3)
Sodium: 133 mEq/L — ABNORMAL LOW (ref 137–147)

## 2013-12-07 MED ORDER — DILTIAZEM HCL ER COATED BEADS 120 MG PO CP24
120.0000 mg | ORAL_CAPSULE | Freq: Every day | ORAL | Status: DC
  Administered 2013-12-07 – 2013-12-09 (×3): 120 mg via ORAL
  Filled 2013-12-07 (×3): qty 1

## 2013-12-07 NOTE — Progress Notes (Signed)
SUBJECTIVE: Pt denies chest pain, palpitations, and shortness of breath.     Intake/Output Summary (Last 24 hours) at 12/07/13 1328 Last data filed at 12/07/13 1313  Gross per 24 hour  Intake   2103 ml  Output   3900 ml  Net  -1797 ml    Current Facility-Administered Medications  Medication Dose Route Frequency Provider Last Rate Last Dose  . 0.9 % NaCl with KCl 40 mEq / L  infusion   Intravenous Continuous [name], MD 10 mL/hr at 12/06/13 1127 10 mL/hr at 12/06/13 1127  . acetaminophen (TYLENOL) tablet 650 mg  650 mg Oral Q6H PRN [name], DO       Or  . acetaminophen (TYLENOL) suppository 650 mg  650 mg Rectal Q6H PRN [name], DO      . albuterol (PROVENTIL) (2.5 MG/3ML) 0.083% nebulizer solution 2.5 mg  2.5 mg Nebulization Q2H PRN [name], DO      . atorvastatin (LIPITOR) tablet 40 mg  40 mg Oral Daily [name], DO   40 mg at 12/07/13 0947  . clindamycin (CLEOCIN) capsule 300 mg  300 mg Oral 4 times per day [name], MD   300 mg at 12/07/13 1303  . diltiazem (CARDIZEM) tablet 30 mg  30 mg Oral 4 times per day Lelon Perla, MD   30 mg at 12/07/13 1303  . fentaNYL (SUBLIMAZE) injection 25-50 mcg  25-50 mcg Intravenous Q2H PRN [name], DO   50 mcg at 12/05/13 2357  . folic acid (FOLVITE) tablet 1 mg  1 mg Oral Daily Lupita Dawn, MD   1 mg at 12/07/13 0947  . gabapentin (NEURONTIN) capsule 300 mg  300 mg Oral BID WC [name], DO   300 mg at 12/07/13 1302   And  . gabapentin (NEURONTIN) tablet 600 mg  600 mg Oral QHS [name], DO   600 mg at 12/06/13 2238  . insulin aspart (novoLOG) injection 0-15 Units  0-15 Units Subcutaneous TID WC [name], DO   3 Units at 12/07/13 1303  . insulin aspart (novoLOG) injection 0-5 Units  0-5 Units Subcutaneous QHS [name], DO   2 Units at 12/06/13 2232  . insulin glargine (LANTUS) injection 30 Units   30 Units Subcutaneous QHS Vance Gather, MD   30 Units at 12/06/13 2238  . lactated ringers infusion   Intravenous Continuous Finis Bud, MD 10 mL/hr at 12/04/13 1452    . nicotine (NICODERM CQ - dosed in mg/24 hours) patch 14 mg  14 mg Transdermal Daily [name], DO   14 mg at 12/07/13 0949  . ondansetron (ZOFRAN) tablet 4 mg  4 mg Oral Q6H PRN [name], DO       Or  . ondansetron [facility]) injection 4 mg  4 mg Intravenous Q6H PRN [name], DO      . oxyCODONE (Oxy IR/ROXICODONE) immediate release tablet 5-15 mg  5-15 mg Oral Q3H PRN Mcarthur Rossetti, MD   15 mg at 12/07/13 0947  . pantoprazole (PROTONIX) EC tablet 40 mg  40 mg Oral QHS Lupita Dawn, MD   40 mg at 12/06/13 2238  . rivaroxaban (XARELTO) tablet 20 mg  20 mg Oral Q supper Dayna N Dunn, PA-C   20 mg at 12/06/13 1725  . sodium chloride 0.9 % injection 3 mL  3 mL Intravenous Q12H [name], DO   3 mL at 12/06/13 0946  .  thiamine (VITAMIN B-1) tablet 100 mg  100 mg Oral Daily Lupita Dawn, MD   100 mg at 12/07/13 0947    Filed Vitals:   12/06/13 1134 12/06/13 1830 12/06/13 2049 12/07/13 0620  BP: 135/78 139/86 144/83 135/83  Pulse: 91 90 98 78  Temp: 99.6 F (37.6 C) 100.3 F (37.9 C) 100.2 F (37.9 C) 99.3 F (37.4 C)  TempSrc: Oral Oral Oral Oral  Resp: 16 18 18 19   Height:   6\' 4"  (1.93 m)   Weight:   215 lb 3.2 oz (97.614 kg)   SpO2: 96% 96% 96% 94%    PHYSICAL EXAM General: NAD Neck: No JVD. Lungs: Clear to auscultation bilaterally with normal respiratory effort. CV: Nondisplaced PMI.  Regular rate and rhythm, normal S1/S2, no S3/S4, no murmur.   Neurologic: Alert and oriented x 3.  Psych: Normal affect.   TELEMETRY: Reviewed telemetry pt in sinus rhythm with PVC's and PAC's. No consistent runs of atrial fibrillation.  LABS: Basic Metabolic Panel:  Recent Labs  12/06/13 0239 12/07/13 0600  NA 132* 133*  K 4.7 4.3  CL 98 96  CO2 21 23    GLUCOSE 163* 127*  BUN 4* 5*  CREATININE 0.68 0.66  CALCIUM 8.6 9.0   Liver Function Tests: No results found for this basename: AST, ALT, ALKPHOS, BILITOT, PROT, ALBUMIN,  in the last 72 hours No results found for this basename: LIPASE, AMYLASE,  in the last 72 hours CBC:  Recent Labs  12/06/13 0239 12/07/13 0600  WBC 11.1* 11.2*  HGB 10.1* 9.3*  HCT 30.4* 28.3*  MCV 86.9 86.3  PLT 370 362   Cardiac Enzymes: No results found for this basename: CKTOTAL, CKMB, CKMBINDEX, TROPONINI,  in the last 72 hours BNP: No components found with this basename: POCBNP,  D-Dimer: No results found for this basename: DDIMER,  in the last 72 hours Hemoglobin A1C: No results found for this basename: HGBA1C,  in the last 72 hours Fasting Lipid Panel: No results found for this basename: CHOL, HDL, LDLCALC, TRIG, CHOLHDL, LDLDIRECT,  in the last 72 hours Thyroid Function Tests: No results found for this basename: TSH, T4TOTAL, FREET3, T3FREE, THYROIDAB,  in the last 72 hours Anemia Panel: No results found for this basename: VITAMINB12, FOLATE, FERRITIN, TIBC, IRON, RETICCTPCT,  in the last 72 hours  RADIOLOGY: Dg Chest 2 View  12/03/2013   CLINICAL DATA:  chest pain  EXAM: CHEST  2 VIEW  COMPARISON:  Two-view chest radiograph 11/21/2013  FINDINGS: Low lung volumes. The heart size and mediastinal contours are within normal limits. Both lungs are clear. The visualized skeletal structures are unremarkable.  IMPRESSION: No active cardiopulmonary disease.   Electronically Signed   By: Margaree Mackintosh M.D.   On: 12/03/2013 18:26   Dg Chest 2 View  11/21/2013   CLINICAL DATA:  Osteomyelitis left great toe.  Diabetes.  EXAM: CHEST  2 VIEW  COMPARISON:  04/24/2012  FINDINGS: Heart is normal size. No confluent airspace opacities or effusions. No acute bony abnormality.  IMPRESSION: No active disease.   Electronically Signed   By: Rolm Baptise M.D.   On: 11/21/2013 12:57   Dg Foot Complete Right  12/03/2013    CLINICAL DATA:  Pain and swelling.  EXAM: RIGHT FOOT COMPLETE - 3+ VIEW  COMPARISON:  Radiographs 08/30/2012 and MRI 03/07/2013.  FINDINGS: Diffuse hindfoot soft tissue swelling and air in the soft tissues worrisome for infection (gas gangrene). There is an open wound on the  lateral aspect of the foot near the base of the fifth metatarsal. Findings suspicious for osteomyelitis.  IMPRESSION: Diffuse hindfoot swelling and gas in the soft tissues worrisome for infection (gas gangrene).  Open wound near the base of the fifth metatarsal with underlying osteomyelitis.   Electronically Signed   By: Kalman Jewels M.D.   On: 12/03/2013 18:25      ASSESSMENT AND PLAN: 1. Severe sepsis/hypotension from osteomyelitis with gas gangrene from R-foot infected ulcers; now s/p BKA  2. Paroxysmal atrial fibrillation with RVR now sinus  3. PAD by ABIs 2014  4. AKI, resolved  5. Diabetes mellitus, uncontrolled, A1C 14 in 09/2013  6. HTN  7. Ongoing tobacco abuse  8. Drug screen positive for cocaine.   Likely trigger for atrial fibrillation was the increased adrenaline state of his infection, with no recent recurrences. CHADSVASC = 3 for DM, HTN, vascular disease. Xarelto already initiated. TSH wnl. Echo with normal LV function. Will switch to long-acting diltiazem; no beta blocker in setting of cocaine. Will need ischemic workup for risk stratification given known vascular disease and multiple cardiac risk factors as outpatient once he recovers from surgery.    Kate Sable, M.D., F.A.C.C.

## 2013-12-07 NOTE — Progress Notes (Signed)
Family Medicine Teaching Service Daily Progress Note Intern Pager: [phone]  Patient name: Steven [name] Medical record number: [id number] Date of birth: 12-23-1960 Age: 53 y.o. 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 male presenting with severe sepsis due to worsening infected Left-foot DM ulcers, found to have osteomyelitis and gas-gangrene, additionally found to be in new onset AFib with RVR. PMH is significant for DM-2 (poorly controlled, complicated peripheral neuropathy), multiple bilateral toe amputations (Dr. Sharol Given) with h/o osteomyelitis, HTN, PVD.   Osteomyelitis with gas gangrene: POD #3 s/p transtibial amputation of RLE (6/25) - Follow-up blood culture x 2 (collected 6/24): NGTD - Vancomycin, zosyn, clindamycin (6/24-6/27 ), de-escalate to PO clindamycin - Pain control: Fentanyl 25-48mg IV q 2 hr PRN, oxycodone 15 mg q 3PRN, Tylenol, Gabapentin (home) - cautious with hypotension  - Leukocytosis improving 14.5 > 12.6 > 11.5 > 11.1 > 11.2 - NWB RLE, up with PT/OT  Sepsis: Resolved  Paroxysmal atrial fibrillation with RVR, unknown duration:  - continue diltiazem 371mq6h, avoid BB as cocaine + - CHADs-VASC score 3: continue xarelto per cardiology - Echocardiogram: EF 55-60%, no wall motion abnormalities - Ischemic work up as outpatient  Acute blood loss anemia: Stable from yesterday 14.6 > 10.5 > 10.4 - Monitor with daily CBC  PAD: shown with ABIs 2014  Acute Kidney Injury: Resolved - Hold ACEi, nephrotoxic agents   T2DM, uncontrolled: Hb A1c 14.6% - CBGs mostly 100s, range 90-203, continue lantus 30u - moderate  SSI  - Monitor insulin requirement on hospital diet and titrate as necessary  Hyponatremia, hypovolemic: stable - Na 130 > 135>132 - Monitor BMET   Hypokalemia, mild: Resolved, K 4.7  HTN  - Hold home anti-HTN agents (Lisinopril - HCTZ) due to low BPs - Started dilt as above  HLD  - Resume home statin   Tobacco abuse  - Active smoker 0.5ppd, >30 yrs  - Nicotine patch 1457maily   FEN/GI: KVO IVF, carb mod diet Prophylaxis: Xarelto  Disposition: Continue management, CIR to assess tomorrow.   Subjective:  Pain well controlled, ready to get up with PT. Breathing well.  Objective: Temp:  [99.3 F (37.4 C)-100.3 F (37.9 C)] 99.3 F (37.4 C) (06/28 0620) Pulse Rate:  [78-98] 78 (06/28 0620) Resp:  [16-19] 19 (06/28 0620) BP: (135-144)/(78-86) 135/83 mmHg (06/28 0620) SpO2:  [94 %-96 %] 94 % (06/28 0620) Weight:  [215 lb 3.2 oz (97.614 kg)] 215 lb 3.2 oz (97.614 kg) (06/27 2049) Physical Exam: General: 53 103o. male resting quietly in NAD Cardiovascular: RRR no murmur, NSR on ECG Respiratory: Non-labored, slight crackles at the bases BL Abdomen: +BS, soft, NT, ND Extremities: LLE: dry bandage with some dried blood along the distal foot; RLE s/p transtibial amputation, bandage in place c/d/i  Laboratory:  Recent Labs Lab 12/05/13 0305 12/06/13 0239 12/07/13 0600  WBC 11.5* 11.1* 11.2*  HGB 10.4* 10.1* 9.3*  HCT 31.6* 30.4* 28.3*  PLT 375 370 362    Recent Labs Lab 12/04/13 0525 12/06/13 0239 12/07/13 0600  NA 135* 132* 133*  K 3.4* 4.7 4.3  CL 97 98 96  CO2 '24 21 23  ' BUN 19 4* 5*  CREATININE 0.85 0.68  0.66  CALCIUM 8.6 8.6 9.0  GLUCOSE 144* 163* 127*   Lactic Acid - 1.89  Troponin - negative x1  ESR - 120 CRP - 28.7 TSH - 0.457 Cortisol - 11.1 HgbA1c - 14.6 Blood Cultures (x 2, collected 12/03/13)   6/24 CXR 2v  FINDINGS:  Low lung volumes. The heart size and mediastinal contours are within  normal limits. Both lungs are clear. The visualized  skeletal  structures are unremarkable.  IMPRESSION:  No active cardiopulmonary disease.   6/24 Right Foot, complete  IMPRESSION:  Diffuse hindfoot swelling and gas in the soft tissues worrisome for  infection (gas gangrene).  Open wound near the base of the fifth metatarsal with underlying  osteomyelitis.  Vance Gather, MD 12/07/2013, 9:03 AM PGY-1, [place] Intern pager: [phone], text pages welcome

## 2013-12-07 NOTE — Progress Notes (Signed)
FMTS ATTENDING  NOTE Chanel Mckesson,MD I  have seen and examined this patient, reviewed their chart. I have discussed this patient with the resident. I agree with the resident's findings, assessment and care plan. 

## 2013-12-08 DIAGNOSIS — I739 Peripheral vascular disease, unspecified: Secondary | ICD-10-CM

## 2013-12-08 DIAGNOSIS — S88119A Complete traumatic amputation at level between knee and ankle, unspecified lower leg, initial encounter: Secondary | ICD-10-CM

## 2013-12-08 DIAGNOSIS — L98499 Non-pressure chronic ulcer of skin of other sites with unspecified severity: Secondary | ICD-10-CM

## 2013-12-08 LAB — CBC
HEMATOCRIT: 27.6 % — AB (ref 39.0–52.0)
HEMOGLOBIN: 9.2 g/dL — AB (ref 13.0–17.0)
MCH: 28.8 pg (ref 26.0–34.0)
MCHC: 33.3 g/dL (ref 30.0–36.0)
MCV: 86.3 fL (ref 78.0–100.0)
Platelets: 395 10*3/uL (ref 150–400)
RBC: 3.2 MIL/uL — AB (ref 4.22–5.81)
RDW: 14.8 % (ref 11.5–15.5)
WBC: 9.4 10*3/uL (ref 4.0–10.5)

## 2013-12-08 LAB — BASIC METABOLIC PANEL
BUN: 5 mg/dL — AB (ref 6–23)
CHLORIDE: 96 meq/L (ref 96–112)
CO2: 25 mEq/L (ref 19–32)
Calcium: 9 mg/dL (ref 8.4–10.5)
Creatinine, Ser: 0.67 mg/dL (ref 0.50–1.35)
GFR calc Af Amer: >90 mL/min (ref >90)
GFR calc non Af Amer: >90 mL/min (ref >90)
GLUCOSE: 179 mg/dL — AB (ref 70–99)
POTASSIUM: 4 meq/L (ref 3.7–5.3)
SODIUM: 133 meq/L — AB (ref 137–147)

## 2013-12-08 LAB — GLUCOSE, CAPILLARY
GLUCOSE-CAPILLARY: 88 mg/dL (ref 70–99)
GLUCOSE-CAPILLARY: 270 mg/dL — AB (ref 70–99)
Glucose-Capillary: 303 mg/dL — ABNORMAL HIGH (ref 70–99)
Glucose-Capillary: 201 mg/dL — ABNORMAL HIGH (ref 70–99)
Glucose-Capillary: 199 mg/dL — ABNORMAL HIGH (ref 70–99)

## 2013-12-08 NOTE — Progress Notes (Signed)
Family Medicine Teaching Service Daily Progress Note Intern Pager: [phone]  Patient name: Steven [name] Medical record number: [id number] Date of birth: 02-Jul-1960 Age: 53 y.o. 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   Assessment and Plan: Steven [name] is a 53 y.o. male presenting with severe sepsis due to worsening infected Left-foot DM ulcers, found to have osteomyelitis and gas-gangrene, additionally found to be in new onset AFib with RVR. PMH is significant for DM-2 (poorly controlled, complicated peripheral neuropathy), multiple bilateral toe amputations (Dr. Sharol Given) with h/o osteomyelitis, HTN, PVD.   Osteomyelitis with gas gangrene: POD #4 s/p transtibial amputation of RLE (6/25) - Follow-up blood culture x 2 (collected 6/24): NGTD - Vancomycin, zosyn (6/24-6/27), clindamycin (-6/29) - Pain control: Fentanyl 25-50mg IV q 2 hr PRN, oxycodone 15 mg q 3PRN, Tylenol, Gabapentin (home) - cautious with hypotension  - Leukocytosis resolved - NWB RLE, up with PT/OT  Sepsis: Resolved  Paroxysmal atrial fibrillation with RVR, unknown duration:  - Diltiazem 1235mdaily, avoid BB as cocaine + - CHADs-VASC score 3: continue xarelto per cardiology - Echocardiogram: EF 55-60%, no wall motion abnormalities - Ischemic work up as outpatient  Acute blood loss anemia: Stable from yesterday 14.6 > 10.5 > 10.4 - Monitor with daily CBC  PAD: shown with ABIs 2014  Acute Kidney Injury: Resolved - Hold ACEi, nephrotoxic agents   T2DM, uncontrolled: Hb A1c 14.6% - CBGs mostly 100s, range 90-203, continue lantus 30u - moderate SSI  - Monitor  insulin requirement on hospital diet and titrate as necessary  Hyponatremia, hypovolemic: stable - Na 130 > 135>132 - Monitor BMET   Hypokalemia, mild: Resolved, K 4.7  HTN  - Hold home anti-HTN agents (Lisinopril - HCTZ) due to low BPs - Started dilt as above  HLD  - Resume home statin   Tobacco abuse  - Active smoker 0.5ppd, >30 yrs  - Nicotine patch 1469maily   FEN/GI: KVO IVF, carb mod diet Prophylaxis: Xarelto  Disposition: Stable for disposition. CIR to assess today.  Subjective:  Pain well controlled, ready to get up with PT. Denies CP, SOB, palpitations.   Objective: Temp:  [98.9 F (37.2 C)-99.6 F (37.6 C)] 98.9 F (37.2 C) (06/29 0518) Pulse Rate:  [87-91] 87 (06/29 0518) Resp:  [18] 18 (06/29 0518) BP: (130-144)/(79-87) 130/79 mmHg (06/29 0518) SpO2:  [97 %-98 %] 97 % (06/29 0518) Weight:  [209 lb 3.2 oz (94.892 kg)] 209 lb 3.2 oz (94.892 kg) (06/28 2131) Physical Exam: General: 53 82o. male resting quietly in NAD Cardiovascular: RRR no murmur, NSR on ECG Respiratory: Non-labored, slight crackles at the bases BL Abdomen: +BS, soft, NT, ND Extremities: LLE: with bandage; RLE s/p transtibial amputation, bandage in place c/d/i  Laboratory:  Recent Labs Lab 12/06/13 0239 12/07/13 0600 12/08/13 0400  WBC 11.1* 11.2* 9.4  HGB 10.1* 9.3* 9.2*  HCT 30.4* 28.3* 27.6*  PLT 370 362 395    Recent Labs Lab 12/06/13 0239 12/07/13 0600 12/08/13 0400  NA 132* 133* 133*  K 4.7 4.3 4.0  CL 98 96 96  CO2 _0 BUN 4* 5* 5*  CREATININE 0.68 0.66 0.67  CALCIUM 8.6 9.0 9.0  GLUCOSE 163* 127* 179*  Lactic Acid - 1.89  Troponin - negative x1  ESR - 120 CRP - 28.7 TSH - 0.457 Cortisol - 11.1 HgbA1c - 14.6 Blood Cultures (x 2, collected 12/03/13)   6/24 CXR 2v  FINDINGS:  Low lung volumes. The heart size and mediastinal contours are within  normal limits. Both lungs are clear. The visualized skeletal  structures are unremarkable.   IMPRESSION:  No active cardiopulmonary disease.   6/24 Right Foot, complete  IMPRESSION:  Diffuse hindfoot swelling and gas in the soft tissues worrisome for  infection (gas gangrene).  Open wound near the base of the fifth metatarsal with underlying  osteomyelitis.  Vance Gather, MD 12/08/2013, 8:15 AM PGY-1, [place] Intern pager: [phone], text pages welcome

## 2013-12-08 NOTE — Consult Note (Signed)
Physical Medicine and Rehabilitation Consult Reason for Consult: L-BKA Referring Physician: Dr. Erlinda Hong   HPI: Steven [name] is a 53 y.o. male with h/o DM type 2--poorly controlled with peripheral neuropathy, HTN,  multiple bilateral diabetic foot ulcers with osteomyelitis and infection who had refused BKA in the past but was admitted on 12/03/13 with severe sepsis due to worsening of right foot ulcers with purulent drainage as well as gas gangrene. He was noted to have A fib with RVR and UDS + cocaine. No BB in setting of cocaine and long term anticoagulation recommended by Dr. Stanford Breed.  2D echo with EF 55-65% and no wall abnormality noted.  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 Cardiology recommends ischemic work up once he recovers from surgery. PT evaluation done and patient with heavy reliance on UE. He lives alone and has medicaid--CM and MD recommending CIR for progressive therapies due to insurance constraints.   The patient still has stomach pain on the right. Foot sensation but not foot pain. Also is concerned about staples or sutures being removed in the left foot. Review of Systems  HENT: Negative for hearing loss.   Eyes: Negative for blurred vision and double vision.  Respiratory: Negative for cough and sputum production.   Cardiovascular: Negative for chest pain and palpitations.  Gastrointestinal: Negative for heartburn, nausea, abdominal pain and constipation.  Genitourinary: Negative for dysuria and frequency.  Musculoskeletal: Positive for joint pain. Negative for back pain and myalgias.  Neurological: Negative for dizziness, tingling and headaches.    Past Medical History  Diagnosis Date  . Uncontrolled diabetes mellitus   . HTN (hypertension)   . History of DVT (deep vein thrombosis)   . Personal history of diabetic foot ulcer   . Peripheral vascular disease     a. Abnl ABIs 2014.  Marland Kitchen Peripheral  neuropathy 11/19/2011  . Varicose vein     legs  . Osteomyelitis of foot, right, acute 08/30/2012  . Personal history of colonic adenomas 05/29/2013  . History of cocaine use   . PAF (paroxysmal atrial fibrillation)     a. Dx 11/2013 in setting of sepsis/foot infection.  . Tobacco abuse   . H/O ETOH abuse     Past Surgical History  Procedure Laterality Date  . Amputation  04/21/2011    Procedure: AMPUTATION RAY;  Surgeon: Newt Minion, MD;  Location: [facility];  Service: Orthopedics;  Laterality: Right;  Rt foot 2nd ray ampt  . I&d extremity  05/03/2011    Procedure: IRRIGATION AND DEBRIDEMENT EXTREMITY;  Surgeon: Newt Minion, MD;  Location: [facility];  Service: Orthopedics;  Laterality: Right;  Irrigation and Debridement Right Foot and Place Acell Xenograft  . Toe amputation  04/24/2012    great toe   right foot  . Amputation  04/24/2012    Procedure: AMPUTATION RAY;  Surgeon: Newt Minion, MD;  Location: [facility];  Service: Orthopedics;  Laterality: Right;  Right foot 1st ray amputation  . Amputation Left 04/23/2013    Procedure: AMPUTATION RAY;  Surgeon: Newt Minion, MD;  Location: [facility];  Service: Orthopedics;  Laterality: Left;  Left Foot 5th Ray Amputation  . Colonoscopy    . Amputation Left 11/21/2013    Procedure: AMPUTATION RAY;  Surgeon: Newt Minion, MD;  Location: [facility];  Service: Orthopedics;  Laterality: Left;  Left Foot 1st & 2nd Ray Amputation  . Amputation Right 12/04/2013  Procedure: AMPUTATION BELOW KNEE;  Surgeon: Marianna Payment, MD;  Location: [facility];  Service: Orthopedics;  Laterality: Right;    Family History  Problem Relation Age of Onset  . Diabetes Mother   . Stroke Brother   . Colon cancer Neg Hx    Social History:  Lives alone. Disabled due to medical issues--has an aide 3 days/week to help with housework. Used walker in home and wheelchair when out of home. He reports that he has been smoking Cigarettes--1/2 PPD.   He has a 15 pack-year smoking history. He  has never used smokeless tobacco. He reports that he drinks about 2.5 - 3 ounces of alcohol per week. He reports that he does not use illicit drugs--reports cocaine was "one time experiment"  Allergies: No Known Allergies  Medications Prior to Admission  Medication Sig Dispense Refill  . albuterol (PROVENTIL HFA;VENTOLIN HFA) 108 (90 BASE) MCG/ACT inhaler Inhale 2 puffs into the lungs every 6 (six) hours as needed for wheezing or shortness of breath.  1 Inhaler  0  . atorvastatin (LIPITOR) 40 MG tablet Take 1 tablet (40 mg total) by mouth daily.  90 tablet  0  . cetirizine (ZYRTEC) 10 MG tablet Take 1 tablet (10 mg total) by mouth daily.  30 tablet  5  . doxycycline (VIBRAMYCIN) 100 MG capsule Take 100 mg by mouth 2 (two) times daily.      . fluticasone (FLONASE) 50 MCG/ACT nasal spray Place 2 sprays into both nostrils daily.  16 g  1  . gabapentin (NEURONTIN) 300 MG capsule Take 300-600 mg by mouth 3 (three) times daily. Take 300mg  in the morning, take 300mg  at lunch, and take 600mg  at bedtime.      . hydrocortisone cream 1 % Apply 1 application topically 2 (two) times daily as needed for itching.      . insulin glargine (LANTUS) 100 UNIT/ML injection Inject 30 Units into the skin at bedtime.      Marland Kitchen lisinopril-hydrochlorothiazide (PRINZIDE,ZESTORETIC) 20-25 MG per tablet Take 1 tablet by mouth daily.  90 tablet  0    Home: Home Living Family/patient expects to be discharged to:: Unsure Living Arrangements: Alone Available Help at Discharge: Friend(s) (states he can call friends to help as needed) Type of Home: Apartment Home Access: Level entry Home Layout: One level Home Equipment: Walker - 2 wheels;Cane - single point;Wheelchair - power  Functional History: Prior Function Level of Independence: Needs assistance Gait / Transfers Assistance Needed: Using crutches per pt, however during last admit was using RW.   ADL's / Homemaking Assistance Needed: pt states he has "a lady" who cleans  for him.   Functional Status:  Mobility:   Transfers Overall transfer level: Needs assistance Equipment used: None Transfers: Squat Pivot Transfers Squat pivot transfers: Min assist General transfer comment: pt sitting on 3-in-1 on arrival.  pt able to squat pivot towards L side with heavy reliance on UEs.  Attempted to give pt cueing for technique, however pt does not want cueing and states "Just hold your thought.  I'm gonna figure this out."        ADL:    Cognition: Cognition Overall Cognitive Status: Within Functional Limits for tasks assessed Orientation Level: Oriented X4 Cognition Arousal/Alertness: Awake/alert Behavior During Therapy: WFL for tasks assessed/performed Overall Cognitive Status: Within Functional Limits for tasks assessed  Blood pressure 130/79, pulse 87, temperature 98.9 F (37.2 C), temperature source Oral, resp. rate 18, height 6\' 4"  (1.93 m), weight 94.892 kg (209  lb 3.2 oz), SpO2 97.00%. Physical Exam  Nursing note and vitals reviewed. Constitutional: He is oriented to person, place, and time. He appears well-developed and well-nourished.  HENT:  Head: Normocephalic and atraumatic.  Eyes: Conjunctivae are normal.  Neck: Normal range of motion. Neck supple.  Cardiovascular: Normal rate and regular rhythm.   Respiratory: Effort normal and breath sounds normal. No respiratory distress. He has no wheezes.  GI: Soft. Bowel sounds are normal. He exhibits no distension. There is no tenderness.  Musculoskeletal:  R-BKA with compressive coban dressing. Left foot with dry serosanguinous drainage on dressing.   Neurological: He is alert and oriented to person, place, and time.  Skin: Skin is warm and dry.   left foot first and second ray amputation with a bulky dressing Upper extremity strength 5/5 in the deltoid, biceps, triceps and grip Right hip flexor 4/5 left hip flexor 4/5 knee extension 4/53 minus ankle dorsiflexor plantar flexor on the  left  Results for orders placed during the hospital encounter of 12/03/13 (from the past 24 hour(s))  GLUCOSE, CAPILLARY     Status: Abnormal   Collection Time    12/07/13 11:36 AM      Result Value Ref Range   Glucose-Capillary 194 (*) 70 - 99 mg/dL  GLUCOSE, CAPILLARY     Status: Abnormal   Collection Time    12/07/13  4:41 PM      Result Value Ref Range   Glucose-Capillary 225 (*) 70 - 99 mg/dL  GLUCOSE, CAPILLARY     Status: Abnormal   Collection Time    12/07/13  9:31 PM      Result Value Ref Range   Glucose-Capillary 303 (*) 70 - 99 mg/dL  CBC     Status: Abnormal   Collection Time    12/08/13  4:00 AM      Result Value Ref Range   WBC 9.4  4.0 - 10.5 K/uL   RBC 3.20 (*) 4.22 - 5.81 MIL/uL   Hemoglobin 9.2 (*) 13.0 - 17.0 g/dL   HCT 27.6 (*) 39.0 - 52.0 %   MCV 86.3  78.0 - 100.0 fL   MCH 28.8  26.0 - 34.0 pg   MCHC 33.3  30.0 - 36.0 g/dL   RDW 14.8  11.5 - 15.5 %   Platelets 395  150 - 400 K/uL  BASIC METABOLIC PANEL     Status: Abnormal   Collection Time    12/08/13  4:00 AM      Result Value Ref Range   Sodium 133 (*) 137 - 147 mEq/L   Potassium 4.0  3.7 - 5.3 mEq/L   Chloride 96  96 - 112 mEq/L   CO2 25  19 - 32 mEq/L   Glucose, Bld 179 (*) 70 - 99 mg/dL   BUN 5 (*) 6 - 23 mg/dL   Creatinine, Ser 0.67  0.50 - 1.35 mg/dL   Calcium 9.0  8.4 - 10.5 mg/dL   GFR calc non Af Amer >90  >90 mL/min   GFR calc Af Amer >90  >90 mL/min  GLUCOSE, CAPILLARY     Status: None   Collection Time    12/08/13  8:25 AM      Result Value Ref Range   Glucose-Capillary 88  70 - 99 mg/dL   Comment 1 Notify RN     Comment 2 Documented in Chart     No results found.  Assessment/Plan: Diagnosis: Right BKA 1. Does the need for close, 24  hr/day medical supervision in concert with the patient's rehab needs make it unreasonable for this patient to be served in a less intensive setting? Yes 2. Co-Morbidities requiring supervision/potential complications: Left first and second ray  amputations 11/21/2013, diabetes uncontrolled, hypertension, peripheral neuropathy, cocaine abuse 3. Due to bowel management, safety, skin/wound care, disease management, medication administration, pain management and patient education, does the patient require 24 hr/day rehab nursing? Yes 4. Does the patient require coordinated care of a physician, rehab nurse, PT (1-2 hrs/day, 5 days/week) and OT (1-2 hrs/day, 5 days/week) to address physical and functional deficits in the context of the above medical diagnosis(es)? Yes Addressing deficits in the following areas: balance, endurance, locomotion, strength, transferring, bathing, dressing, feeding, grooming and toileting 5. Can the patient actively participate in an intensive therapy program of at least 3 hrs of therapy per day at least 5 days per week? Yes 6. The potential for patient to make measurable gains while on inpatient rehab is good 7. Anticipated functional outcomes upon discharge from inpatient rehab are modified independent  with PT, modified independent with OT, n/a with SLP. 8. Estimated rehab length of stay to reach the above functional goals is: 7-10 days 9. Does the patient have adequate social supports to accommodate these discharge functional goals? Yes 10. Anticipated D/C setting: Home 11. Anticipated post D/C treatments: [facility] therapy 12. Overall Rehab/Functional Prognosis: good  RECOMMENDATIONS: This patient's condition is appropriate for continued rehabilitative care in the following setting: CIR Patient has agreed to participate in recommended program. Yes Note that insurance prior authorization may be required for reimbursement for recommended care.  Comment:  Please clarify postoperative care for left foot including any weightbearing restrictions    12/08/2013

## 2013-12-08 NOTE — Progress Notes (Signed)
Patient: Steven [name] / Admit Date: 12/03/2013 / Date of Encounter: 12/08/2013, 9:55 AM  Subjective  Denies acute complaint other than intermittent R foot pain. Admits to cocaine use "for the experience, like everyone does when they are growing up." He insists he is going home. I see plans for CIR.  Objective   Telemetry: NSR occ PACs  Physical Exam: Blood pressure 134/86, pulse 97, temperature 98.5 F (36.9 C), temperature source Oral, resp. rate 18, height 6\' 4"  (1.93 m), weight 209 lb 3.2 oz (94.892 kg), SpO2 98.00%. General: Well developed, well nourished AAM in no acute distress. Head: Normocephalic, atraumatic, sclera non-icteric, no xanthomas, nares are without discharge. Neck: JVP not elevated. Lungs: Coarse BS R base, no wheezes, rales, or rhonchi. Breathing is unlabored. Heart: RRR S1 S2 without murmurs, rubs, or gallops.  Abdomen: Soft, non-tender, non-distended with normoactive bowel sounds. No rebound/guarding. Extremities: No clubbing or cyanosis. S/p R  Transtibial amputation. LEs are wrapped. Neuro: Alert and oriented X 3. Moves all extremities spontaneously. Psych:  Responds to questions appropriately with a normal affect.   Intake/Output Summary (Last 24 hours) at 12/08/13 0955 Last data filed at 12/08/13 0918  Gross per 24 hour  Intake 1526.67 ml  Output   4025 ml  Net -2498.33 ml    Inpatient Medications:  . atorvastatin  40 mg Oral Daily  . clindamycin  300 mg Oral 4 times per day  . diltiazem  120 mg Oral Daily  . folic acid  1 mg Oral Daily  . gabapentin  300 mg Oral BID WC   And  . gabapentin  600 mg Oral QHS  . insulin aspart  0-15 Units Subcutaneous TID WC  . insulin aspart  0-5 Units Subcutaneous QHS  . insulin glargine  30 Units Subcutaneous QHS  . nicotine  14 mg Transdermal Daily  . pantoprazole  40 mg Oral QHS  . rivaroxaban  20 mg Oral Q supper  . sodium chloride  3 mL Intravenous Q12H  . thiamine  100 mg Oral Daily   Infusions:  .  0.9 % NaCl with KCl 40 mEq / L 10 mL/hr (12/06/13 1127)  . lactated ringers 10 mL/hr at 12/04/13 1452    Labs:  Recent Labs  12/07/13 0600 12/08/13 0400  NA 133* 133*  K 4.3 4.0  CL 96 96  CO2 23 25  GLUCOSE 127* 179*  BUN 5* 5*  CREATININE 0.66 0.67  CALCIUM 9.0 9.0   No results found for this basename: AST, ALT, ALKPHOS, BILITOT, PROT, ALBUMIN,  in the last 72 hours  Recent Labs  12/07/13 0600 12/08/13 0400  WBC 11.2* 9.4  HGB 9.3* 9.2*  HCT 28.3* 27.6*  MCV 86.3 86.3  PLT 362 395   No results found for this basename: CKTOTAL, CKMB, TROPONINI,  in the last 72 hours No components found with this basename: POCBNP,  No results found for this basename: HGBA1C,  in the last 72 hours   Radiology/Studies:  Dg Chest 2 View  12/03/2013   CLINICAL DATA:  chest pain  EXAM: CHEST  2 VIEW  COMPARISON:  Two-view chest radiograph 11/21/2013  FINDINGS: Low lung volumes. The heart size and mediastinal contours are within normal limits. Both lungs are clear. The visualized skeletal structures are unremarkable.  IMPRESSION: No active cardiopulmonary disease.   Electronically Signed   By: Margaree Mackintosh M.D.   On: 12/03/2013 18:26   Dg Chest 2 View  11/21/2013   CLINICAL DATA:  Osteomyelitis  left great toe.  Diabetes.  EXAM: CHEST  2 VIEW  COMPARISON:  04/24/2012  FINDINGS: Heart is normal size. No confluent airspace opacities or effusions. No acute bony abnormality.  IMPRESSION: No active disease.   Electronically Signed   By: Rolm Baptise M.D.   On: 11/21/2013 12:57   Dg Foot Complete Right  12/03/2013   CLINICAL DATA:  Pain and swelling.  EXAM: RIGHT FOOT COMPLETE - 3+ VIEW  COMPARISON:  Radiographs 08/30/2012 and MRI 03/07/2013.  FINDINGS: Diffuse hindfoot soft tissue swelling and air in the soft tissues worrisome for infection (gas gangrene). There is an open wound on the lateral aspect of the foot near the base of the fifth metatarsal. Findings suspicious for osteomyelitis.   IMPRESSION: Diffuse hindfoot swelling and gas in the soft tissues worrisome for infection (gas gangrene).  Open wound near the base of the fifth metatarsal with underlying osteomyelitis.   Electronically Signed   By: Kalman Jewels M.D.   On: 12/03/2013 18:25     Assessment and Plan  53 yo male with history of uncontrolled DM, HTN, HL, tobacco abuse has had multiple amputations of toes. During his last clinic visit, Dr. Jess Barters recommendation was to perform a BKA due to his ischemic, nonhealing ulcers and location of ulcers. Patient was resistant to this and opted for abx instead. He underwent amputation of L foot first ray and left 2nd toe 11/21/13. He presents back 12/03/2013 with worsening pain, pus, and was found to be hypotensive/septic and with PAF-RVR (newly recognized).   1. 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 Paroxysmal atrial fibrillation with RVR, newly recognized, maintaining NSR 3. PAD by ABIs 2014  4. AKI, resolved  5. Diabetes mellitus, uncontrolled, A1C 14  6. HTN  7. Ongoing tobacco abuse, prior h/o EtOH abuse, + cocaine UDS (prior hx as well) 8. Anemia, ?post-operative, per IM  Likely trigger for atrial fibrillation is the increased adrenaline state of his infection. CHADSVASC = 3 - Xarelto started. TSH wnl. 2D echo with normal LV function, no RWMA. Regarding PVD, may benefit from vascular f/u although this may be futile if DM continues to be uncontrolled with ongoing smoking and cocaine. At some point may need ischemic workup for risk stratification given known vascular disease and multiple cardiac risk factors as outpatient once he recovers from surgery. He denies anginal symptoms. Continue diltiazem. Avoid BB given cocaine use - risks discussed with patient who says it was only a one time "experience." Will need to follow Hgb given that he is on Xarelto. Please let us know when  patient is being discharged so we can arrange outpatient f/u.  Signed, Melina Copa PA-C  Patient seen with PA, agree with the above note.  He is maintaining NSR.  He needs to remain on Xarelto due to CVA risk with PAF though he is somewhat resistant to taking it.  I encouraged him to take it.  He will also continue diltiazem CD for rate control if her were to go back into atrial fibrillation.    Loralie Champagne 12/08/2013 11:33 AM

## 2013-12-08 NOTE — Progress Notes (Signed)
Physical Therapy Treatment Patient Details Name: Steven [name] MRN: [id number] DOB: May 11, 1961 Today's Date: 12/08/2013    History of Present Illness Adm 12/03/13 with infected foot ulcers Rt foot, with sepsis and new onset atrial fibrillation. 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      PT Comments    Pt very motivated and in good spirits. Educated on need for 1) edema management (via elevation and exercise) and 2) knee extension. Anticipate can walk a short distance with RW on next visit.   Follow Up Recommendations  CIR     Equipment Recommendations   (TBD if no CIR)    Recommendations for Other Services OT consult     Precautions / Restrictions Precautions Precautions: Fall Restrictions Weight Bearing Restrictions: Yes RLE Weight Bearing: Non weight bearing LLE Weight Bearing: Weight bearing as tolerated Other Position/Activity Restrictions: L LE WBAT according to old notes, pt has no recollection of any WBing status.      Mobility  Bed Mobility Overal bed mobility: Modified Independent                Transfers Overall transfer level: Needs assistance Equipment used: None Transfers: Squat Pivot Transfers;Sit to/from Stand Sit to Stand: Mod assist;Min assist;From elevated surface   Squat pivot transfers: Min assist;From elevated surface     General transfer comment: squat-pivot bed to chair on his left; from chair, stood x 2 with stedy (once with foot on platform which made it difficult due to foot on higher surface; once with stedy reversed and foot on floor).  Ambulation/Gait                 Stairs            Wheelchair Mobility    Modified Rankin (Stroke Patients Only)       Balance     Sitting balance-Leahy Scale: Good     Standing balance support: Bilateral upper extremity supported Standing balance-Leahy Scale:  Poor Standing balance comment: stood up to 60 seconds                    Cognition Arousal/Alertness: Awake/alert Behavior During Therapy: WFL for tasks assessed/performed Overall Cognitive Status: Within Functional Limits for tasks assessed                      Exercises Amputee Exercises Quad Sets: 5 reps Hip ABduction/ADduction: Other (comment) (educated to perform in bed) Hip Flexion/Marching: 15 reps Knee Flexion: 20 reps (2 sets of ten; pt actively assisting knee flexion with hands) Knee Extension: 20 reps Straight Leg Raises: 5 reps    General Comments        Pertinent Vitals/Pain Denied pain prior to mobility/exercise "not bad" after activity.Marland KitchenMarland Kitchen"I will probably need a pain pill later"    Home Living                      Prior Function            PT Goals (current goals can now be found in the care plan section) Acute Rehab PT Goals Patient Stated Goal: Get a prosthesis, go home PT Goal Formulation: With patient Time For Goal Achievement: 12/19/13 Potential to Achieve Goals: Good Progress towards PT goals: Progressing toward goals    Frequency  Min 3X/week    PT Plan Discharge plan needs to be updated  Co-evaluation             End of Session Equipment Utilized During Treatment:  (stedy) Activity Tolerance: Patient tolerated treatment well Patient left: in chair;with call bell/phone within reach;with chair alarm set     Time: 0045-9977 PT Time Calculation (min): 30 min  Charges:  $Therapeutic Exercise: 8-22 mins $Therapeutic Activity: 8-22 mins                    G Codes:      SASSER,LYNN 2013-12-13, 4:29 PM Pager [phone]

## 2013-12-08 NOTE — Progress Notes (Signed)
CALL PAGER [phone] for any questions or notifications regarding this patient   FMTS Attending Daily Note: Sara Neal MD  Attending pager:[phone]  office [phone]  I  have seen and examined this patient, reviewed their chart. I have discussed this patient with the resident. I agree with the resident's findings, assessment and care plan. 

## 2013-12-08 NOTE — Discharge Summary (Signed)
[facility] Discharge Summary  Patient name: Steven [name] Medical record number: [id number] Date of birth: August 04, 1960 Age: 53 y.o. Gender: male Date of Admission: 12/03/2013  Date of Discharge: 12/09/2013 Admitting Physician: Lupita Dawn, MD  Primary Care Provider: Chrisandra Netters, MD Consultants: Cardiology, orthopedic surgery  Indication for Hospitalization: Sepsis due to gangrene of RLE  Discharge Diagnoses/Problem List:  Patient Active Problem List   Diagnosis Date Noted  . Cocaine abuse 12/05/2013  . Tobacco abuse 12/04/2013  . History of cocaine use   . Peripheral vascular disease   . Atrial fibrillation with RVR 12/03/2013  . Severe sepsis 12/03/2013  . Osteomyelitis of ankle or foot, right, acute 12/03/2013  . Osteomyelitis of ankle or foot, acute 11/21/2013  . Gangrene of foot 11/21/2013  . 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 Personal history of colonic adenomas 05/29/2013  . Diabetic retinopathy 12/25/2012  . HLD (hyperlipidemia) 12/08/2012  . Osteomyelitis of foot 08/30/2012  . Peripheral neuropathy 11/19/2011  . ETOH abuse 03/29/2011  . HTN (hypertension) 12/22/2010  . ONYCHOMYCOSIS, TOENAILS 07/18/2006  . Diabetes mellitus type II, uncontrolled 07/18/2006  . VENOUS INSUFFICIENCY 07/18/2006  . CALLUSES, FEET, BILATERAL 07/18/2006   Disposition: Discharge to CIR  Discharge Condition: Stable  Discharge Exam:  General: 53 y.o. male resting quietly in NAD  Cardiovascular: RRR no murmur, NSR on ECG  Respiratory: Non-labored, slight crackles at the bases BL  Abdomen: +BS, soft, NT, ND  Extremities: LLE: with bandage; RLE s/p transtibial amputation, bandage in place c/d/i  Brief Hospital Course:  Steven [name] is a 53 y.o. male presenting on 6/24 with severe sepsis due to worsening infected right foot DM ulcers, found to have osteomyelitis and gas-gangrene, additionally found to be in new onset AFib with RVR. PMH is  significant for DM-2 (poorly controlled, complicated peripheral neuropathy), multiple bilateral toe amputations (Dr. Sharol Given) with h/o osteomyelitis, HTN, PVD, and ongoing cocaine abuse.   [name] was septic at admission, so he was placed on vancomycin and zosyn as well as clindamycin to limit exotoxin synthesis after obtaining blood cultures.  Orthopedic surgery performed a transtibial amputation of the right lower extremity on 6/25. There were no post-operative complications. All SIRS criteria resolved and antibiotics were discontinued on 6/29.    Spoke with Colletta Maryland from Dr. Jess Barters office and received instructions on incision care for pt's left foot. Informed Genie from rehab that he wants daily dressing changes with silvadene and nitro patches. Per Colletta Maryland, pt has office visit on next week to have sutures removed; but if rehab MD feels site is healed enough he has permission to remove sutures.   His heart rhythm showed paroxysmal atrial fibrillation with RVR of unknown duration. Cardiology was consulted and began oral diltiazem with good rate control. Beta blockers were contraindicated as the patien had a positive drug screen for cocaine. Due to a CHADs-VASC score of at least 3, long-term anticoagulation was advised and xarelto was started on 6/27. He had reverted to normal sinus rhythm post-operatively. An ischemic work up was recommended to be performed after convalescence and rehabilitation.   Hemoglobin A1c was 14.6% indicating an average blood glucose of ~336m/dL. His home dose on 30 units of lantus as well as moderate sliding scale insulin provided fair control (most CBGs 100-200), indicating either poor adherence to diet or to insulin regimen or both.   Home anti-HTN agents (Lisinopril - HCTZ) were initially held due to low BP and AKI. Diltiazem was added and will be continued  at discharge. Blood pressures have been within normal limits on diltiazem alone.   Issues for Follow Up:  -  Continued management of uncontrolled diabetes and HTN as well as other risk factor modifications.  - Provide continued substance abuse counseling resources.   Significant Procedures: Transtibial amputation of the right lower extremity (12/04/2013)  Significant Labs and Imaging:   Recent Labs Lab 12/07/13 0600 12/08/13 0400 12/09/13 0555  WBC 11.2* 9.4 8.0  HGB 9.3* 9.2* 9.9*  HCT 28.3* 27.6* 29.7*  PLT 362 395 409*    Recent Labs Lab 12/04/13 0525 12/06/13 0239 12/07/13 0600 12/08/13 0400 12/09/13 0555  NA 135* 132* 133* 133* 134*  K 3.4* 4.7 4.3 4.0 4.0  CL 97 98 96 96 96  CO2 24 21 23 25 25  GLUCOSE 144* 163* 127* 179* 99  BUN 19 4* 5* 5* 5*  CREATININE 0.85 0.68 0.66 0.67 0.67  CALCIUM 8.6 8.6 9.0 9.0 9.2   Lactic Acid - 1.89  Troponin - negative x3  ESR - 120  CRP - 28.7  TSH - 0.457  Cortisol - 11.1  HgbA1c - 14.6  Blood Cultures (x 2, collected 12/03/13)  6/24 CXR 2v  FINDINGS:  Low lung volumes. The heart size and mediastinal contours are within  normal limits. Both lungs are clear. The visualized skeletal  structures are unremarkable.  IMPRESSION:  No active cardiopulmonary disease.  6/24 Right Foot, complete  IMPRESSION:  Diffuse hindfoot swelling and gas in the soft tissues worrisome for  infection (gas gangrene).  Open wound near the base of the fifth metatarsal with underlying  osteomyelitis.  Results/Tests Pending at Time of Discharge: None  Discharge Medications:    Medication List         albuterol 108 (90 BASE) MCG/ACT inhaler  Commonly known as:  PROVENTIL HFA;VENTOLIN HFA  Inhale 2 puffs into the lungs every 6 (six) hours as needed for wheezing or shortness of breath.     atorvastatin 40 MG tablet  Commonly known as:  LIPITOR  Take 1 tablet (40 mg total) by mouth daily.     cetirizine 10 MG tablet  Commonly known as:  ZYRTEC  Take 1 tablet (10 mg total) by mouth daily.     diltiazem 120 MG 24 hr capsule  Commonly known as:   CARDIZEM CD  Take 1 capsule (120 mg total) by mouth daily.     doxycycline 100 MG capsule  Commonly known as:  VIBRAMYCIN  Take 100 mg by mouth 2 (two) times daily.     fluticasone 50 MCG/ACT nasal spray  Commonly known as:  FLONASE  Place 2 sprays into both nostrils daily.     gabapentin 300 MG capsule  Commonly known as:  NEURONTIN  Take 300-600 mg by mouth 3 (three) times daily. Take 300mg in the morning, take 300mg at lunch, and take 600mg at bedtime.     HYDROcodone-acetaminophen 5-325 MG per tablet  Commonly known as:  NORCO  Take 1-2 tablets by mouth every 6 (six) hours as needed.     hydrocortisone cream 1 %  Apply 1 application topically 2 (two) times daily as needed for itching.     insulin glargine 100 UNIT/ML injection  Commonly known as:  LANTUS  Inject 30 Units into the skin at bedtime.     lisinopril-hydrochlorothiazide 20-25 MG per tablet  Commonly known as:  PRINZIDE,ZESTORETIC  Take 1 tablet by mouth daily.        Discharge Instructions: Please refer   to Patient Instructions section of EMR for full details.  Patient was counseled important signs and symptoms that should prompt return to medical care, changes in medications, dietary instructions, activity restrictions, and follow up appointments.   Follow-Up Appointments: Follow-up Information   Schedule an appointment as soon as possible for a visit with Steven Netters, MD.   Specialty:  [facility]   Contact information:   [address] [place] [address] [phone]      Vance Gather, MD 12/09/2013, 12:11 PM PGY-1, [facility]

## 2013-12-08 NOTE — Anesthesia Postprocedure Evaluation (Signed)
  Anesthesia Post-op Note  Patient: Steven [name]  Procedure(s) Performed: Procedure(s): AMPUTATION BELOW KNEE (Right)  Patient Location: PACU  Anesthesia Type:General  Level of Consciousness: awake and alert   Airway and Oxygen Therapy: Patient Spontanous Breathing  Post-op Pain: mild  Post-op Assessment: Post-op Vital signs reviewed  Post-op Vital Signs: stable  Last Vitals:  Filed Vitals:   12/08/13 0518  BP: 130/79  Pulse: 87  Temp: 37.2 C  Resp: 18    Complications: No apparent anesthesia complications

## 2013-12-08 NOTE — Progress Notes (Signed)
Inpatient Diabetes Program Recommendations  AACE/ADA: New Consensus Statement on Inpatient Glycemic Control (2013)  Target Ranges:  Prepandial:   less than 140 mg/dL      Peak postprandial:   less than 180 mg/dL (1-2 hours)      Critically ill patients:  140 - 180 mg/dL   Results for Steven [name], Steven [name] (MRN [id number]) as of 12/08/2013 10:57  Ref. Range 12/07/2013 07:39 12/07/2013 11:36 12/07/2013 16:41 12/07/2013 21:31 12/08/2013 08:25  Glucose-Capillary Latest Range: 70-99 mg/dL 113 (H) 194 (H) 225 (H) 303 (H) 88   Diabetes history: DM2 Outpatient Diabetes medications: Lantus 30 units QHS Current orders for Inpatient glycemic control: Lantus 30 units QHS, Novolog 0-15 units AC, Novolog 0-5 units HS  Inpatient Diabetes Program Recommendations Insulin - Meal Coverage: Please consider ordering Novolog 5 units TID with meals for meal coverage (in addition to Novolog correction scale).  Thanks, Barnie Alderman, RN, MSN, CCRN Diabetes Coordinator Inpatient Diabetes Program [phone] (Team Pager) [phone] (AP office) [phone] [facility] office)

## 2013-12-09 ENCOUNTER — Encounter (HOSPITAL_COMMUNITY): Payer: Self-pay

## 2013-12-09 ENCOUNTER — Inpatient Hospital Stay (HOSPITAL_COMMUNITY)
Admission: RE | Admit: 2013-12-09 | Discharge: 2013-12-18 | DRG: 945 | Disposition: A | Discharge status: Home Health | Payer: Medicaid Other | Source: Intra-hospital | Attending: Physical Medicine & Rehabilitation | Admitting: Physical Medicine & Rehabilitation

## 2013-12-09 DIAGNOSIS — I4891 Unspecified atrial fibrillation: Secondary | ICD-10-CM | POA: Diagnosis present

## 2013-12-09 DIAGNOSIS — S88119A Complete traumatic amputation at level between knee and ankle, unspecified lower leg, initial encounter: Secondary | ICD-10-CM

## 2013-12-09 DIAGNOSIS — M86171 Other acute osteomyelitis, right ankle and foot: Secondary | ICD-10-CM

## 2013-12-09 DIAGNOSIS — Z7982 Long term (current) use of aspirin: Secondary | ICD-10-CM

## 2013-12-09 DIAGNOSIS — IMO0002 Reserved for concepts with insufficient information to code with codable children: Secondary | ICD-10-CM

## 2013-12-09 DIAGNOSIS — Z86718 Personal history of other venous thrombosis and embolism: Secondary | ICD-10-CM

## 2013-12-09 DIAGNOSIS — E1149 Type 2 diabetes mellitus with other diabetic neurological complication: Secondary | ICD-10-CM | POA: Diagnosis present

## 2013-12-09 DIAGNOSIS — I10 Essential (primary) hypertension: Secondary | ICD-10-CM | POA: Diagnosis present

## 2013-12-09 DIAGNOSIS — Z5189 Encounter for other specified aftercare: Secondary | ICD-10-CM | POA: Diagnosis present

## 2013-12-09 DIAGNOSIS — E871 Hypo-osmolality and hyponatremia: Secondary | ICD-10-CM | POA: Diagnosis present

## 2013-12-09 DIAGNOSIS — D62 Acute posthemorrhagic anemia: Secondary | ICD-10-CM | POA: Diagnosis present

## 2013-12-09 DIAGNOSIS — K5909 Other constipation: Secondary | ICD-10-CM | POA: Diagnosis not present

## 2013-12-09 DIAGNOSIS — L98499 Non-pressure chronic ulcer of skin of other sites with unspecified severity: Secondary | ICD-10-CM

## 2013-12-09 DIAGNOSIS — E1165 Type 2 diabetes mellitus with hyperglycemia: Secondary | ICD-10-CM

## 2013-12-09 DIAGNOSIS — E1142 Type 2 diabetes mellitus with diabetic polyneuropathy: Secondary | ICD-10-CM | POA: Diagnosis present

## 2013-12-09 DIAGNOSIS — Z4789 Encounter for other orthopedic aftercare: Secondary | ICD-10-CM | POA: Diagnosis not present

## 2013-12-09 DIAGNOSIS — I96 Gangrene, not elsewhere classified: Secondary | ICD-10-CM

## 2013-12-09 DIAGNOSIS — S88111D Complete traumatic amputation at level between knee and ankle, right lower leg, subsequent encounter: Secondary | ICD-10-CM

## 2013-12-09 DIAGNOSIS — E1169 Type 2 diabetes mellitus with other specified complication: Secondary | ICD-10-CM | POA: Diagnosis present

## 2013-12-09 DIAGNOSIS — T4275XA Adverse effect of unspecified antiepileptic and sedative-hypnotic drugs, initial encounter: Secondary | ICD-10-CM | POA: Diagnosis not present

## 2013-12-09 DIAGNOSIS — G629 Polyneuropathy, unspecified: Secondary | ICD-10-CM | POA: Diagnosis present

## 2013-12-09 DIAGNOSIS — M86179 Other acute osteomyelitis, unspecified ankle and foot: Secondary | ICD-10-CM | POA: Diagnosis present

## 2013-12-09 DIAGNOSIS — M9080 Osteopathy in diseases classified elsewhere, unspecified site: Secondary | ICD-10-CM | POA: Diagnosis present

## 2013-12-09 DIAGNOSIS — Z794 Long term (current) use of insulin: Secondary | ICD-10-CM

## 2013-12-09 DIAGNOSIS — I739 Peripheral vascular disease, unspecified: Secondary | ICD-10-CM | POA: Diagnosis present

## 2013-12-09 DIAGNOSIS — F17200 Nicotine dependence, unspecified, uncomplicated: Secondary | ICD-10-CM | POA: Diagnosis present

## 2013-12-09 DIAGNOSIS — M869 Osteomyelitis, unspecified: Secondary | ICD-10-CM | POA: Diagnosis present

## 2013-12-09 LAB — GLUCOSE, CAPILLARY
GLUCOSE-CAPILLARY: 216 mg/dL — AB (ref 70–99)
Glucose-Capillary: 104 mg/dL — ABNORMAL HIGH (ref 70–99)
Glucose-Capillary: 171 mg/dL — ABNORMAL HIGH (ref 70–99)
Glucose-Capillary: 166 mg/dL — ABNORMAL HIGH (ref 70–99)
Glucose-Capillary: 239 mg/dL — ABNORMAL HIGH (ref 70–99)

## 2013-12-09 LAB — BASIC METABOLIC PANEL
BUN: 5 mg/dL — AB (ref 6–23)
CALCIUM: 9.2 mg/dL (ref 8.4–10.5)
CO2: 25 mEq/L (ref 19–32)
Chloride: 96 mEq/L (ref 96–112)
Creatinine, Ser: 0.67 mg/dL (ref 0.50–1.35)
GFR calc Af Amer: >90 mL/min (ref >90)
GLUCOSE: 99 mg/dL (ref 70–99)
Potassium: 4 mEq/L (ref 3.7–5.3)
Sodium: 134 mEq/L — ABNORMAL LOW (ref 137–147)

## 2013-12-09 LAB — CBC
HEMATOCRIT: 29.7 % — AB (ref 39.0–52.0)
Hemoglobin: 9.9 g/dL — ABNORMAL LOW (ref 13.0–17.0)
MCH: 28.6 pg (ref 26.0–34.0)
MCHC: 33.3 g/dL (ref 30.0–36.0)
MCV: 85.8 fL (ref 78.0–100.0)
Platelets: 409 10*3/uL — ABNORMAL HIGH (ref 150–400)
RBC: 3.46 MIL/uL — ABNORMAL LOW (ref 4.22–5.81)
RDW: 14.7 % (ref 11.5–15.5)
WBC: 8 10*3/uL (ref 4.0–10.5)

## 2013-12-09 LAB — CULTURE, BLOOD (ROUTINE X 2): CULTURE: NO GROWTH

## 2013-12-09 MED ORDER — RIVAROXABAN 20 MG PO TABS
20.0000 mg | ORAL_TABLET | Freq: Every day | ORAL | Status: DC
  Administered 2013-12-09 – 2013-12-17 (×9): 20 mg via ORAL
  Filled 2013-12-09 (×10): qty 1

## 2013-12-09 MED ORDER — HYDROCHLOROTHIAZIDE 25 MG PO TABS
25.0000 mg | ORAL_TABLET | Freq: Every day | ORAL | Status: DC
  Administered 2013-12-10 – 2013-12-11 (×2): 25 mg via ORAL
  Filled 2013-12-09 (×3): qty 1

## 2013-12-09 MED ORDER — LISINOPRIL 20 MG PO TABS
20.0000 mg | ORAL_TABLET | Freq: Every day | ORAL | Status: DC
  Administered 2013-12-10 – 2013-12-11 (×2): 20 mg via ORAL
  Filled 2013-12-09 (×4): qty 1

## 2013-12-09 MED ORDER — ONDANSETRON HCL 4 MG/2ML IJ SOLN: 4.0000 mg | Freq: Four times a day (QID) | INTRAMUSCULAR | Status: DC | PRN

## 2013-12-09 MED ORDER — LISINOPRIL-HYDROCHLOROTHIAZIDE 20-25 MG PO TABS: 1.0000 | ORAL_TABLET | Freq: Every day | ORAL | Status: DC

## 2013-12-09 MED ORDER — NICOTINE 14 MG/24HR TD PT24
14.0000 mg | MEDICATED_PATCH | Freq: Every day | TRANSDERMAL | Status: DC
  Administered 2013-12-10 – 2013-12-18 (×9): 14 mg via TRANSDERMAL
  Filled 2013-12-09 (×10): qty 1

## 2013-12-09 MED ORDER — ALBUTEROL SULFATE (2.5 MG/3ML) 0.083% IN NEBU: 2.5000 mg | INHALATION_SOLUTION | RESPIRATORY_TRACT | Status: DC | PRN

## 2013-12-09 MED ORDER — ATORVASTATIN CALCIUM 40 MG PO TABS
40.0000 mg | ORAL_TABLET | Freq: Every day | ORAL | Status: DC
  Administered 2013-12-10 – 2013-12-18 (×9): 40 mg via ORAL
  Filled 2013-12-09 (×10): qty 1

## 2013-12-09 MED ORDER — FOLIC ACID 1 MG PO TABS
1.0000 mg | ORAL_TABLET | Freq: Every day | ORAL | Status: DC
  Administered 2013-12-10 – 2013-12-18 (×9): 1 mg via ORAL
  Filled 2013-12-09 (×10): qty 1

## 2013-12-09 MED ORDER — INSULIN GLARGINE 100 UNIT/ML ~~LOC~~ SOLN
30.0000 [IU] | Freq: Every day | SUBCUTANEOUS | Status: DC
  Administered 2013-12-09 – 2013-12-17 (×9): 30 [IU] via SUBCUTANEOUS
  Filled 2013-12-09 (×10): qty 0.3

## 2013-12-09 MED ORDER — HYDROCHLOROTHIAZIDE 25 MG PO TABS
25.0000 mg | ORAL_TABLET | Freq: Every day | ORAL | Status: DC
  Administered 2013-12-09: 25 mg via ORAL
  Filled 2013-12-09: qty 1

## 2013-12-09 MED ORDER — GABAPENTIN 600 MG PO TABS
600.0000 mg | ORAL_TABLET | Freq: Every day | ORAL | Status: DC
  Administered 2013-12-09 – 2013-12-17 (×9): 600 mg via ORAL
  Filled 2013-12-09 (×10): qty 1

## 2013-12-09 MED ORDER — GABAPENTIN 300 MG PO CAPS
300.0000 mg | ORAL_CAPSULE | Freq: Two times a day (BID) | ORAL | Status: DC
  Administered 2013-12-10 – 2013-12-18 (×17): 300 mg via ORAL
  Filled 2013-12-09 (×21): qty 1

## 2013-12-09 MED ORDER — PANTOPRAZOLE SODIUM 40 MG PO TBEC
40.0000 mg | DELAYED_RELEASE_TABLET | Freq: Every day | ORAL | Status: DC
  Administered 2013-12-09 – 2013-12-17 (×9): 40 mg via ORAL
  Filled 2013-12-09 (×11): qty 1

## 2013-12-09 MED ORDER — INSULIN ASPART 100 UNIT/ML ~~LOC~~ SOLN
0.0000 [IU] | Freq: Every day | SUBCUTANEOUS | Status: DC
  Administered 2013-12-09: 2 [IU] via SUBCUTANEOUS
  Administered 2013-12-11: 3 [IU] via SUBCUTANEOUS
  Administered 2013-12-17: 2 [IU] via SUBCUTANEOUS

## 2013-12-09 MED ORDER — VITAMIN B-1 100 MG PO TABS
100.0000 mg | ORAL_TABLET | Freq: Every day | ORAL | Status: DC
  Administered 2013-12-10 – 2013-12-18 (×9): 100 mg via ORAL
  Filled 2013-12-09 (×11): qty 1

## 2013-12-09 MED ORDER — ONDANSETRON HCL 4 MG PO TABS: 4.0000 mg | ORAL_TABLET | Freq: Four times a day (QID) | ORAL | Status: DC | PRN

## 2013-12-09 MED ORDER — DILTIAZEM HCL ER COATED BEADS 120 MG PO CP24: 120.0000 mg | ORAL_CAPSULE | Freq: Every day | ORAL | Status: DC

## 2013-12-09 MED ORDER — INSULIN ASPART 100 UNIT/ML ~~LOC~~ SOLN
0.0000 [IU] | Freq: Three times a day (TID) | SUBCUTANEOUS | Status: DC
  Administered 2013-12-09 – 2013-12-10 (×2): 3 [IU] via SUBCUTANEOUS
  Administered 2013-12-10: 2 [IU] via SUBCUTANEOUS
  Administered 2013-12-10: 5 [IU] via SUBCUTANEOUS
  Administered 2013-12-11 (×2): 3 [IU] via SUBCUTANEOUS
  Administered 2013-12-11: 2 [IU] via SUBCUTANEOUS
  Administered 2013-12-12 (×2): 5 [IU] via SUBCUTANEOUS
  Administered 2013-12-12: 2 [IU] via SUBCUTANEOUS
  Administered 2013-12-13: 3 [IU] via SUBCUTANEOUS
  Administered 2013-12-13: 5 [IU] via SUBCUTANEOUS
  Administered 2013-12-14 (×2): 3 [IU] via SUBCUTANEOUS
  Administered 2013-12-15 (×2): 2 [IU] via SUBCUTANEOUS
  Administered 2013-12-15 – 2013-12-16 (×3): 3 [IU] via SUBCUTANEOUS
  Administered 2013-12-17 (×2): 2 [IU] via SUBCUTANEOUS

## 2013-12-09 MED ORDER — LISINOPRIL 20 MG PO TABS
20.0000 mg | ORAL_TABLET | Freq: Every day | ORAL | Status: DC
  Administered 2013-12-09: 20 mg via ORAL
  Filled 2013-12-09: qty 1

## 2013-12-09 MED ORDER — OXYCODONE HCL 5 MG PO TABS
5.0000 mg | ORAL_TABLET | ORAL | Status: DC | PRN
Start: 2013-12-09 — End: 2013-12-18
  Administered 2013-12-09 – 2013-12-18 (×40): 15 mg via ORAL
  Filled 2013-12-09 (×41): qty 3

## 2013-12-09 MED ORDER — NITROGLYCERIN 0.1 MG/HR TD PT24
0.1000 mg | MEDICATED_PATCH | Freq: Every day | TRANSDERMAL | Status: DC
  Administered 2013-12-10 – 2013-12-18 (×9): 0.1 mg via TRANSDERMAL
  Filled 2013-12-09 (×10): qty 1

## 2013-12-09 MED ORDER — SILVER SULFADIAZINE 1 % EX CREA
TOPICAL_CREAM | Freq: Two times a day (BID) | CUTANEOUS | Status: DC
  Administered 2013-12-09 – 2013-12-11 (×4): via TOPICAL
  Administered 2013-12-11: 1 via TOPICAL
  Administered 2013-12-12 – 2013-12-17 (×12): via TOPICAL
  Filled 2013-12-09: qty 85

## 2013-12-09 MED ORDER — DILTIAZEM HCL ER COATED BEADS 120 MG PO CP24
120.0000 mg | ORAL_CAPSULE | Freq: Every day | ORAL | Status: DC
  Administered 2013-12-10 – 2013-12-18 (×9): 120 mg via ORAL
  Filled 2013-12-09 (×10): qty 1

## 2013-12-09 MED ORDER — DOXYCYCLINE HYCLATE 100 MG PO TABS
100.0000 mg | ORAL_TABLET | Freq: Two times a day (BID) | ORAL | Status: DC
  Administered 2013-12-09 – 2013-12-18 (×18): 100 mg via ORAL
  Filled 2013-12-09 (×20): qty 1

## 2013-12-09 NOTE — Progress Notes (Signed)
Physical Medicine and Rehabilitation Consult  Reason for Consult: L-BKA  Referring Physician: Dr. Erlinda Hong  HPI: Steven [name] is a 53 y.o. male with h/o DM type 2--poorly controlled with peripheral neuropathy, HTN, multiple bilateral diabetic foot ulcers with osteomyelitis and infection who had refused BKA in the past but was admitted on 12/03/13 with severe sepsis due to worsening of right foot ulcers with purulent drainage as well as gas gangrene. He was noted to have A fib with RVR and UDS + cocaine. No BB in setting of cocaine and long term anticoagulation recommended by Dr. Stanford Breed. 2D echo with EF 55-65% and no wall abnormality noted. 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 Cardiology recommends ischemic work up once he recovers from surgery. PT evaluation done and patient with heavy reliance on UE. He lives alone and has medicaid--CM and MD recommending CIR for progressive therapies due to insurance constraints.  The patient still has stomach pain on the right. Foot sensation but not foot pain. Also is concerned about staples or sutures being removed in the left foot.  Review of Systems  HENT: Negative for hearing loss.  Eyes: Negative for blurred vision and double vision.  Respiratory: Negative for cough and sputum production.  Cardiovascular: Negative for chest pain and palpitations.  Gastrointestinal: Negative for heartburn, nausea, abdominal pain and constipation.  Genitourinary: Negative for dysuria and frequency.  Musculoskeletal: Positive for joint pain. Negative for back pain and myalgias.  Neurological: Negative for dizziness, tingling and headaches.   Past Medical History   Diagnosis  Date   .  Uncontrolled diabetes mellitus    .  HTN (hypertension)    .  History of DVT (deep vein thrombosis)    .  Personal history of diabetic foot ulcer    .  Peripheral vascular disease      a. Abnl ABIs 2014.   Marland Kitchen  Peripheral  neuropathy  11/19/2011   .  Varicose vein      legs   .  Osteomyelitis of foot, right, acute  08/30/2012   .  Personal history of colonic adenomas  05/29/2013   .  History of cocaine use    .  PAF (paroxysmal atrial fibrillation)      a. Dx 11/2013 in setting of sepsis/foot infection.   .  Tobacco abuse    .  H/O ETOH abuse     Past Surgical History   Procedure  Laterality  Date   .  Amputation   04/21/2011     Procedure: AMPUTATION RAY; Surgeon: Newt Minion, MD; Location: [place]; Service: Orthopedics; Laterality: Right; Rt foot 2nd ray ampt   .  I&d extremity   05/03/2011     Procedure: IRRIGATION AND DEBRIDEMENT EXTREMITY; Surgeon: Newt Minion, MD; Location: [place]; Service: Orthopedics; Laterality: Right; Irrigation and Debridement Right Foot and Place Acell Xenograft   .  Toe amputation   04/24/2012     great toe right foot   .  Amputation   04/24/2012     Procedure: AMPUTATION RAY; Surgeon: Newt Minion, MD; Location: [place]; Service: Orthopedics; Laterality: Right; Right foot 1st ray amputation   .  Amputation  Left  04/23/2013     Procedure: AMPUTATION RAY; Surgeon: Newt Minion, MD; Location: [place]; Service: Orthopedics; Laterality: Left; Left Foot 5th Ray Amputation   .  Colonoscopy     .  Amputation  Left  11/21/2013  Procedure: AMPUTATION RAY; Surgeon: Newt Minion, MD; Location: [place]; Service: Orthopedics; Laterality: Left; Left Foot 1st & 2nd Ray Amputation   .  Amputation  Right  12/04/2013     Procedure: AMPUTATION BELOW KNEE; Surgeon: Marianna Payment, MD; Location: [place]; Service: Orthopedics; Laterality: Right;    Family History   Problem  Relation  Age of Onset   .  Diabetes  Mother    .  Stroke  Brother    .  Colon cancer  Neg Hx     Social History: Lives alone. Disabled due to medical issues--has an aide 3 days/week to help with housework. Used walker in home and wheelchair when out of home. He reports that he has been smoking Cigarettes--1/2 PPD. He has a  15 pack-year smoking history. He has never used smokeless tobacco. He reports that he drinks about 2.5 - 3 ounces of alcohol per week. He reports that he does not use illicit drugs--reports cocaine was "one time experiment"  Allergies: No Known Allergies  Medications Prior to Admission   Medication  Sig  Dispense  Refill   .  albuterol (PROVENTIL HFA;VENTOLIN HFA) 108 (90 BASE) MCG/ACT inhaler  Inhale 2 puffs into the lungs every 6 (six) hours as needed for wheezing or shortness of breath.  1 Inhaler  0   .  atorvastatin (LIPITOR) 40 MG tablet  Take 1 tablet (40 mg total) by mouth daily.  90 tablet  0   .  cetirizine (ZYRTEC) 10 MG tablet  Take 1 tablet (10 mg total) by mouth daily.  30 tablet  5   .  doxycycline (VIBRAMYCIN) 100 MG capsule  Take 100 mg by mouth 2 (two) times daily.     .  fluticasone (FLONASE) 50 MCG/ACT nasal spray  Place 2 sprays into both nostrils daily.  16 g  1   .  gabapentin (NEURONTIN) 300 MG capsule  Take 300-600 mg by mouth 3 (three) times daily. Take 300mg  in the morning, take 300mg  at lunch, and take 600mg  at bedtime.     .  hydrocortisone cream 1 %  Apply 1 application topically 2 (two) times daily as needed for itching.     .  insulin glargine (LANTUS) 100 UNIT/ML injection  Inject 30 Units into the skin at bedtime.     Marland Kitchen  lisinopril-hydrochlorothiazide (PRINZIDE,ZESTORETIC) 20-25 MG per tablet  Take 1 tablet by mouth daily.  90 tablet  0    Home:  Home Living  Family/patient expects to be discharged to:: Unsure  Living Arrangements: Alone  Available Help at Discharge: Friend(s) (states he can call friends to help as needed)  Type of Home: Apartment  Home Access: Level entry  Home Layout: One level  Home Equipment: Walker - 2 wheels;Cane - single point;Wheelchair - power  Functional History:  Prior Function  Level of Independence: Needs assistance  Gait / Transfers Assistance Needed: Using crutches per pt, however during last admit was using RW.  ADL's /  Homemaking Assistance Needed: pt states he has "a lady" who cleans for him.  Functional Status:  Mobility:   Transfers  Overall transfer level: Needs assistance  Equipment used: None  Transfers: Squat Pivot Transfers  Squat pivot transfers: Min assist  General transfer comment: pt sitting on 3-in-1 on arrival. pt able to squat pivot towards L side with heavy reliance on UEs. Attempted to give pt cueing for technique, however pt does not want cueing and states "Just hold your thought. I'm  gonna figure this out."    ADL:   Cognition:  Cognition  Overall Cognitive Status: Within Functional Limits for tasks assessed  Orientation Level: Oriented X4  Cognition  Arousal/Alertness: Awake/alert  Behavior During Therapy: WFL for tasks assessed/performed  Overall Cognitive Status: Within Functional Limits for tasks assessed  Blood pressure 130/79, pulse 87, temperature 98.9 F (37.2 C), temperature source Oral, resp. rate 18, height 6\' 4"  (1.93 m), weight 94.892 kg (209 lb 3.2 oz), SpO2 97.00%.  Physical Exam  Nursing note and vitals reviewed.  Constitutional: He is oriented to person, place, and time. He appears well-developed and well-nourished.  HENT:  Head: Normocephalic and atraumatic.  Eyes: Conjunctivae are normal.  Neck: Normal range of motion. Neck supple.  Cardiovascular: Normal rate and regular rhythm.  Respiratory: Effort normal and breath sounds normal. No respiratory distress. He has no wheezes.  GI: Soft. Bowel sounds are normal. He exhibits no distension. There is no tenderness.  Musculoskeletal:  R-BKA with compressive coban dressing. Left foot with dry serosanguinous drainage on dressing.  Neurological: He is alert and oriented to person, place, and time.  Skin: Skin is warm and dry.  left foot first and second ray amputation with a bulky dressing  Upper extremity strength 5/5 in the deltoid, biceps, triceps and grip  Right hip flexor 4/5 left hip flexor 4/5 knee  extension 4/53 minus ankle dorsiflexor plantar flexor on the left  Results for orders placed during the hospital encounter of 12/03/13 (from the past 24 hour(s))   GLUCOSE, CAPILLARY Status: Abnormal    Collection Time    12/07/13 11:36 AM   Result  Value  Ref Range    Glucose-Capillary  194 (*)  70 - 99 mg/dL   GLUCOSE, CAPILLARY Status: Abnormal    Collection Time    12/07/13 4:41 PM   Result  Value  Ref Range    Glucose-Capillary  225 (*)  70 - 99 mg/dL   GLUCOSE, CAPILLARY Status: Abnormal    Collection Time    12/07/13 9:31 PM   Result  Value  Ref Range    Glucose-Capillary  303 (*)  70 - 99 mg/dL   CBC Status: Abnormal    Collection Time    12/08/13 4:00 AM   Result  Value  Ref Range    WBC  9.4  4.0 - 10.5 K/uL    RBC  3.20 (*)  4.22 - 5.81 MIL/uL    Hemoglobin  9.2 (*)  13.0 - 17.0 g/dL    HCT  27.6 (*)  39.0 - 52.0 %    MCV  86.3  78.0 - 100.0 fL    MCH  28.8  26.0 - 34.0 pg    MCHC  33.3  30.0 - 36.0 g/dL    RDW  14.8  11.5 - 15.5 %    Platelets  395  150 - 400 K/uL   BASIC METABOLIC PANEL Status: Abnormal    Collection Time    12/08/13 4:00 AM   Result  Value  Ref Range    Sodium  133 (*)  137 - 147 mEq/L    Potassium  4.0  3.7 - 5.3 mEq/L    Chloride  96  96 - 112 mEq/L    CO2  25  19 - 32 mEq/L    Glucose, Bld  179 (*)  70 - 99 mg/dL    BUN  5 (*)  6 - 23 mg/dL    Creatinine, Ser  0.67  0.50 - 1.35 mg/dL    Calcium  9.0  8.4 - 10.5 mg/dL    GFR calc non Af Amer  >90  >90 mL/min    GFR calc Af Amer  >90  >90 mL/min   GLUCOSE, CAPILLARY Status: None    Collection Time    12/08/13 8:25 AM   Result  Value  Ref Range    Glucose-Capillary  88  70 - 99 mg/dL    Comment 1  Notify RN     Comment 2  Documented in Chart     No results found.  Assessment/Plan:  Diagnosis: Right BKA  1. Does the need for close, 24 hr/day medical supervision in concert with the patient's rehab needs make it unreasonable for this patient to be served in a less intensive setting?  Yes 2. Co-Morbidities requiring supervision/potential complications: Left first and second ray amputations 11/21/2013, diabetes uncontrolled, hypertension, peripheral neuropathy, cocaine abuse 3. Due to bowel management, safety, skin/wound care, disease management, medication administration, pain management and patient education, does the patient require 24 hr/day rehab nursing? Yes 4. Does the patient require coordinated care of a physician, rehab nurse, PT (1-2 hrs/day, 5 days/week) and OT (1-2 hrs/day, 5 days/week) to address physical and functional deficits in the context of the above medical diagnosis(es)? Yes Addressing deficits in the following areas: balance, endurance, locomotion, strength, transferring, bathing, dressing, feeding, grooming and toileting 5. Can the patient actively participate in an intensive therapy program of at least 3 hrs of therapy per day at least 5 days per week? Yes 6. The potential for patient to make measurable gains while on inpatient rehab is good 7. Anticipated functional outcomes upon discharge from inpatient rehab are modified independent with PT, modified independent with OT, n/a with SLP. 8. Estimated rehab length of stay to reach the above functional goals is: 7-10 days 9. Does the patient have adequate social supports to accommodate these discharge functional goals? Yes 10. Anticipated D/C setting: Home 11. Anticipated post D/C treatments: [place] therapy 12. Overall Rehab/Functional Prognosis: good RECOMMENDATIONS:  This patient's condition is appropriate for continued rehabilitative care in the following setting: CIR  Patient has agreed to participate in recommended program. Yes  Note that insurance prior authorization may be required for reimbursement for recommended care.  Comment: Please clarify postoperative care for left foot including any weightbearing restrictions  12/08/2013  Revision History...      Date/Time User Action    12/08/2013 6:08 PM  Charlett Blake, MD Sign    12/08/2013 6:08 PM Charlett Blake, MD Share    12/08/2013 10:51 AM Bary Leriche, PA-C Share   View Details Report    Routing History.Marland KitchenMarland Kitchen

## 2013-12-09 NOTE — Progress Notes (Signed)
Patient ID: Steven [name], male   DOB: 09-18-1960, 53 y.o.   MRN: [id number]   Please refer to our complete cardiology note December 08, 2013. Plan to continue current therapy. Please let our cardiology team know when the patient is close to going home so that we can arrange appropriate cardiology followup.  Daryel November, MD

## 2013-12-09 NOTE — Progress Notes (Signed)
PMR Admission Coordinator Pre-Admission Assessment  Patient: Steven [name] is an 53 y.o., male  MRN: [id number]  DOB: [birth year]  Height: 6\' 4"  (193 cm)  Weight: 94.892 kg (209 lb 3.2 oz)  Insurance Information  HMO: PPO: PCP: IPA: 80/20: OTHER:  PRIMARY: Medicaid [place] access Policy#: [id number] n Subscriber: Donato Heinz  CM Name: Phone#: Fax#:  Pre-Cert#: Employer: Not employed  Benefits: Phone #: [phone] Name: Automated  Eff. Date: Eligible 12/08/13 Deduct: Out of Pocket Max: Life Max:  CIR: SNF:  Outpatient: Co-Pay:  Home Health: Co-Pay:  DME: Co-Pay:  Providers:  Emergency Contact Information  Contact Information    Name  Relation  Home  Work  Mobile    [place]  Sister  [phone]        Current Medical History  Patient Admitting Diagnosis: R BKA  History of Present Illness: A 53 y.o. male with h/o DM type 2--poorly controlled with peripheral neuropathy, HTN, multiple bilateral diabetic foot ulcers with osteomyelitis and infection who had refused BKA in the past but was admitted on 12/03/13 with severe sepsis due to worsening of right foot ulcers with purulent drainage as well as gas gangrene. He was noted to have A fib with RVR and UDS + cocaine. No BB in setting of cocaine and long term anticoagulation recommended by Dr. Stanford Breed. 2D echo with EF 55-65% and no wall abnormality noted. 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 Cardiology recommends ischemic work up once he recovers from surgery. PT evaluation done and patient with heavy reliance on UE. He lives alone and has medicaid--CM and MD recommending CIR for progressive therapies due to insurance constraints.  Note: Unit RN spoke with Colletta Maryland from Dr. Jess Barters office and received instructions on incision care for pt's left foot. Dr. Sharol Given wants daily dressing changes with silvadene and nitro patches. Per Colletta Maryland, pt has office visit next week to have  sutures removed; but if rehab MD feels site is healed enough he has permission to remove sutures.  Past Medical History  Past Medical History   Diagnosis  Date   .  Uncontrolled diabetes mellitus    .  HTN (hypertension)    .  History of DVT (deep vein thrombosis)    .  Personal history of diabetic foot ulcer    .  Peripheral vascular disease      a. Abnl ABIs 2014.   Marland Kitchen  Peripheral neuropathy  11/19/2011   .  Varicose vein      legs   .  Osteomyelitis of foot, right, acute  08/30/2012   .  Personal history of colonic adenomas  05/29/2013   .  History of cocaine use    .  PAF (paroxysmal atrial fibrillation)      a. Dx 11/2013 in setting of sepsis/foot infection.   .  Tobacco abuse    .  H/O ETOH abuse     Family History  family history includes Diabetes in his mother; Stroke in his brother. There is no history of Colon cancer.  Prior Rehab/Hospitalizations: No previous rehab admissions.  Current Medications  Current facility-administered medications:0.9 % NaCl with KCl 40 mEq / L infusion, , Intravenous, Continuous, [name], MD, Last Rate: 10 mL/hr at 12/06/13 1127, 10 mL/hr at 12/06/13 1127; acetaminophen (TYLENOL) suppository 650 mg, 650 mg, Rectal, Q6H PRN, [name], DO; acetaminophen (TYLENOL) tablet 650 mg, 650 mg, Oral, Q6H PRN, [name], DO  albuterol (PROVENTIL) (2.5 MG/3ML)  0.083% nebulizer solution 2.5 mg, 2.5 mg, Nebulization, Q2H PRN, [name], DO; atorvastatin (LIPITOR) tablet 40 mg, 40 mg, Oral, Daily, [name], DO, 40 mg at 12/09/13 5053; diltiazem (CARDIZEM CD) 24 hr capsule 120 mg, 120 mg, Oral, Daily, Herminio Commons, MD, 120 mg at 12/09/13 0958  fentaNYL (SUBLIMAZE) injection 25-50 mcg, 25-50 mcg, Intravenous, Q2H PRN, [name], DO, 50 mcg at 97/67/34 1937; folic acid (FOLVITE) tablet 1 mg, 1 mg, Oral, Daily, Lupita Dawn, MD, 1 mg at 12/09/13 9024; gabapentin (NEURONTIN) capsule 300 mg, 300 mg,  Oral, BID WC, [name], DO, 300 mg at 12/09/13 0825; gabapentin (NEURONTIN) tablet 600 mg, 600 mg, Oral, QHS, [name], DO, 600 mg at 12/08/13 2143  hydrochlorothiazide (HYDRODIURIL) tablet 25 mg, 25 mg, Oral, Daily, Lupita Dawn, MD, 25 mg at 12/09/13 0959; insulin aspart (novoLOG) injection 0-15 Units, 0-15 Units, Subcutaneous, TID WC, [name], DO, 5 Units at 12/08/13 1811; insulin aspart (novoLOG) injection 0-5 Units, 0-5 Units, Subcutaneous, QHS, [name], DO, 4 Units at 12/07/13 2155  insulin glargine (LANTUS) injection 30 Units, 30 Units, Subcutaneous, QHS, Vance Gather, MD, 30 Units at 12/08/13 2143; lactated ringers infusion, , Intravenous, Continuous, Finis Bud, MD, Last Rate: 10 mL/hr at 12/04/13 1452; lisinopril (PRINIVIL,ZESTRIL) tablet 20 mg, 20 mg, Oral, Daily, Lupita Dawn, MD, 20 mg at 12/09/13 0973  nicotine (NICODERM CQ - dosed in mg/24 hours) patch 14 mg, 14 mg, Transdermal, Daily, [name], DO, 14 mg at 12/09/13 1000; ondansetron (ZOFRAN) injection 4 mg, 4 mg, Intravenous, Q6H PRN, [name], DO; ondansetron (ZOFRAN) tablet 4 mg, 4 mg, Oral, Q6H PRN, [name], DO; oxyCODONE (Oxy IR/ROXICODONE) immediate release tablet 5-15 mg, 5-15 mg, Oral, Q3H PRN, Mcarthur Rossetti, MD, 15 mg at 12/09/13 5329  pantoprazole (PROTONIX) EC tablet 40 mg, 40 mg, Oral, QHS, Lupita Dawn, MD, 40 mg at 12/08/13 2145; rivaroxaban (XARELTO) tablet 20 mg, 20 mg, Oral, Q supper, Dayna N Dunn, PA-C, 20 mg at 12/08/13 1811; sodium chloride 0.9 % injection 3 mL, 3 mL, Intravenous, Q12H, [name], DO, 3 mL at 12/09/13 9242; thiamine (VITAMIN B-1) tablet 100 mg, 100 mg, Oral, Daily, Lupita Dawn, MD, 100 mg at 12/09/13 6834  Patients Current Diet: Carb Control  Precautions / Restrictions  Precautions  Precautions: Fall  Restrictions  Weight Bearing Restrictions: Yes  RLE Weight Bearing: Non weight  bearing  LLE Weight Bearing: Weight bearing as tolerated  Other Position/Activity Restrictions: L LE WBAT according to old notes, pt has no recollection of any WBing status.  Prior Activity Level  Limited Community (1-2x/wk): Went out about 3 X a week.  Home Assistive Devices / Equipment  Home Assistive Devices/Equipment: Eyeglasses  Home Equipment: Walker - 2 wheels;Cane - single point;Wheelchair - power  Prior Functional Level  Prior Function  Level of Independence: Needs assistance  Gait / Transfers Assistance Needed: Using crutches per pt, however during last admit was using RW.  ADL's / Homemaking Assistance Needed: pt states he has "a lady" who cleans for him.  Current Functional Level  Cognition  Overall Cognitive Status: Within Functional Limits for tasks assessed  Orientation Level: Oriented X4   Extremity Assessment  (includes Sensation/Coordination)  Upper Extremity Assessment: Overall WFL for tasks assessed  Lower Extremity Assessment: RLE deficits/detail;LLE deficits/detail  RLE Deficits / Details: New BKA.  LLE Deficits / Details: pt with recent L first and second ray amputations. pt indicates pain in foot during mobility.   ADLs  Evaluation pending; anticipate ADL needs.   Mobility  Overal bed mobility: Modified Independent   Transfers  Overall transfer level: Needs assistance  Equipment used: None  Transfers: Squat Pivot Transfers;Sit to/from Stand  Sit to Stand: Mod assist;Min assist;From elevated surface  Squat pivot transfers: Min assist;From elevated surface  General transfer comment: squat-pivot bed to chair on his left; from chair, stood x 2 with stedy (once with foot on platform which made it difficult due to foot on higher surface; once with stedy reversed and foot on floor).   Ambulation / Gait / Stairs / Wheelchair Mobility  Not tried on acute   Posture / Balance  Sitting balance-Leahy Scale: Good  Standing balance support: Bilateral upper extremity supported   Standing balance-Leahy Scale: Poor  Standing balance comment: stood up to 60 seconds   Special needs/care consideration  BiPAP/CPAP No  CPM No  Continuous Drip IV No  Dialysis No  Life Vest No  Oxygen No  Special Bed No  Trach Size No  Wound Vac (area) No  Skin Has R BKA incision with dressing and stump wrap. Has left big toe amputation and left small toe amputation with sutures in left big toe. Dressing on left foot. Bowel mgmt: Last BM 12/05/13  Bladder mgmt: Voiding in urinal at bedside.  Diabetic mgmt No   Previous Home Environment  Living Arrangements: Alone  Available Help at Discharge: Friend(s) (states he can call friends to help as needed)  Type of Home: Apartment  Home Layout: One level  Home Access: Level entry  [place]: No  Discharge Living Setting  Plans for Discharge Living Setting: Alone;Apartment  Type of Home at Discharge: Apartment  Discharge Home Layout: One level  Discharge Home Access: Ramped entrance  Does the patient have any problems obtaining your medications?: Yes (Describe)  Social/Family/Support Systems  Patient Roles: Other (Comment) (Single with no children.)  Contact Information: [name] - sister [phone]  Anticipated Caregiver: self  Anticipated Caregiver's Contact Information: Has a friend, Ed, [phone]  Ability/Limitations of Caregiver: No caregiver. Has mod I goals.  Caregiver Availability: Other (Comment) (Family can check on him periodically.)  Discharge Plan Discussed with Primary Caregiver: Yes  Is Caregiver In Agreement with Plan?: Yes  Does Caregiver/Family have Issues with Lodging/Transportation while Pt is in Rehab?: No  Goals/Additional Needs  Patient/Family Goal for Rehab: PT/OT mod I goals  Expected length of stay: 7-10 days  Cultural Considerations: Christian  Dietary Needs: Carb mod med cal, thin liquids  Equipment Needs: TBD  Pt/Family Agrees to Admission and willing to participate: Yes  Program  Orientation Provided & Reviewed with Pt/Caregiver Including Roles & Responsibilities: Yes  Decrease burden of Care through IP rehab admission: N/A  Possible need for SNF placement upon discharge: Not planned  Patient Condition: This patient's condition remains as documented in the consult dated 12/08/13, in which the Rehabilitation Physician determined and documented that the patient's condition is appropriate for intensive rehabilitative care in an inpatient rehabilitation facility pending follow up foot care for left foot. These areas have been addressed. See above Note: Also patient is WBAT on left foot. Will admit to inpatient rehab today.  Preadmission Screen Completed By: Retta Diones, 12/09/2013 12:14 PM  ______________________________________________________________________  Discussed status with Dr. Letta Pate on 12/09/13 at 1235 and received telephone approval for admission today.  Admission Coordinator: Retta Diones, time1235/Date06/30/15  Cosigned by: Charlett Blake, MD [12/09/2013 1:14 PM]

## 2013-12-09 NOTE — Progress Notes (Signed)
CALL PAGER [phone] for any questions or notifications regarding this patient   FMTS Attending Daily Note: Dorcas Mcmurray MD  Attending pager:[phone]  office [phone]  I  have seen and examined this patient, reviewed their chart. I have discussed this patient with the resident. I agree with the resident's findings, assessment and care plan. Will be eligible for CIR (inpatient rehab).

## 2013-12-09 NOTE — Discharge Instructions (Signed)
1. Do not change dressings.  It will be changed at your follow up appointment.  - Take diltiazem 120mg  once per day - Take norco for pain control

## 2013-12-09 NOTE — Progress Notes (Addendum)
Spoke with Colletta Maryland from Dr. Jess Barters office and received instructions on incision care for pt's left foot. Informed Genie from rehab that he wants daily dressing changes with silvadene and nitro patches. Per Colletta Maryland, pt has office visit on next week to have sutures removed; but if rehab MD feels site is healed enough he has permission to remove sutures.    Called Genie and informed her of instructions.

## 2013-12-09 NOTE — Progress Notes (Signed)
Inpatient Diabetes Program Recommendations  AACE/ADA: New Consensus Statement on Inpatient Glycemic Control (2013)  Target Ranges:  Prepandial:   less than 140 mg/dL      Peak postprandial:   less than 180 mg/dL (1-2 hours)      Critically ill patients:  140 - 180 mg/dL     Results for ZEBBIE, ACE (MRN [id number]) as of 12/09/2013 09:12  Ref. Range 12/08/2013 08:25 12/08/2013 11:41 12/08/2013 16:56 12/08/2013 20:31  Glucose-Capillary Latest Range: 70-99 mg/dL 88 270 (H) 201 (H) 199 (H)    Patient having issues with postprandial glucose elevations.  Eating 100% of meals   MD- Please consider ordering Novolog 5 units TID with meals for meal coverage (in addition to Novolog correction scale)   Will follow Wyn Quaker RN, MSN, CDE Diabetes Coordinator Inpatient Diabetes Program Team Pager: [phone] (8a-10p)'

## 2013-12-09 NOTE — Progress Notes (Signed)
Report called to CIR staff. IV removed without issue. Tele box removed and CCMD notified of pt's discharge. Transported pt to CIR via bed. Pt's chart and medications sent through tubing station.

## 2013-12-09 NOTE — Progress Notes (Signed)
Rehab admissions - Evaluated for possible admission.  I met with patient.  He would like to come to inpatient rehab.  Bed available and will admit to acute inpatient rehab today.  Call me for questions.  #[phone]

## 2013-12-09 NOTE — PMR Pre-admission (Signed)
PMR Admission Coordinator Pre-Admission Assessment  Patient: Steven [name] is an 54 y.o., male MRN: [id number] DOB: Mar 05, 1961 Height: 6\' 4"  (193 cm) Weight: 94.892 kg (209 lb 3.2 oz)              Insurance Information HMO:      PPO:       PCP:       IPA:       80/20:       OTHER:   PRIMARY: Medicaid [place] access      Policy#: [id number]      Subscriber: Donato Heinz CM Name:        Phone#:       Fax#:   Pre-Cert#:        Employer: Not employed Benefits:  Phone #: [phone]     Name: Automated Eff. Date: Eligible 12/08/13     Deduct:        Out of Pocket Max:        Life Max:   CIR:        SNF:   Outpatient:       Co-Pay:   Home Health:        Co-Pay:   DME:       Co-Pay:   Providers:    Emergency Contact Information Contact Information   Name Relation Home Work Mobile   [place] Sister [phone]       Current Medical History  Patient Admitting Diagnosis:  R BKA  History of Present Illness: A 53 y.o. male with h/o DM type 2--poorly controlled with peripheral neuropathy, HTN, multiple bilateral diabetic foot ulcers with osteomyelitis and infection who had refused BKA in the past but was admitted on 12/03/13 with severe sepsis due to worsening of right foot ulcers with purulent drainage as well as gas gangrene. He was noted to have A fib with RVR and UDS + cocaine. No BB in setting of cocaine and long term anticoagulation recommended by Dr. Stanford Breed. 2D echo with EF 55-65% and no wall abnormality noted. 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 Cardiology recommends ischemic work up once he recovers from surgery. PT evaluation done and patient with heavy reliance on UE. He lives alone and has medicaid--CM and MD recommending CIR for progressive therapies due to insurance constraints.  Note:  Unit RN spoke with Colletta Maryland from Dr. Jess Barters office and received instructions on incision care for pt's left foot. Dr.  Sharol Given wants daily dressing changes with silvadene and nitro patches. Per Colletta Maryland, pt has office visit  next week to have sutures removed; but if rehab MD feels site is healed enough he has permission to remove sutures.    Past Medical History  Past Medical History  Diagnosis Date  . Uncontrolled diabetes mellitus   . HTN (hypertension)   . History of DVT (deep vein thrombosis)   . Personal history of diabetic foot ulcer   . Peripheral vascular disease     a. Abnl ABIs 2014.  Marland Kitchen Peripheral neuropathy 11/19/2011  . Varicose vein     legs  . Osteomyelitis of foot, right, acute 08/30/2012  . Personal history of colonic adenomas 05/29/2013  . History of cocaine use   . PAF (paroxysmal atrial fibrillation)     a. Dx 11/2013 in setting of sepsis/foot infection.  . Tobacco abuse   . H/O ETOH abuse     Family History  family history includes Diabetes in his mother; Stroke in  his brother. There is no history of Colon cancer.  Prior Rehab/Hospitalizations:  No previous rehab admissions.   Current Medications  Current facility-administered medications:0.9 % NaCl with KCl 40 mEq / L  infusion, , Intravenous, Continuous, [name], MD, Last Rate: 10 mL/hr at 12/06/13 1127, 10 mL/hr at 12/06/13 1127;  acetaminophen (TYLENOL) suppository 650 mg, 650 mg, Rectal, Q6H PRN, [name], DO;  acetaminophen (TYLENOL) tablet 650 mg, 650 mg, Oral, Q6H PRN, [name], DO albuterol (PROVENTIL) (2.5 MG/3ML) 0.083% nebulizer solution 2.5 mg, 2.5 mg, Nebulization, Q2H PRN, [name], DO;  atorvastatin (LIPITOR) tablet 40 mg, 40 mg, Oral, Daily, [name], DO, 40 mg at 12/09/13 2841;  diltiazem (CARDIZEM CD) 24 hr capsule 120 mg, 120 mg, Oral, Daily, Herminio Commons, MD, 120 mg at 12/09/13 0958 fentaNYL (SUBLIMAZE) injection 25-50 mcg, 25-50 mcg, Intravenous, Q2H PRN, [name], DO, 50 mcg at 32/44/01 0272;  folic acid (FOLVITE) tablet 1 mg, 1 mg,  Oral, Daily, Lupita Dawn, MD, 1 mg at 12/09/13 5366;  gabapentin (NEURONTIN) capsule 300 mg, 300 mg, Oral, BID WC, [name], DO, 300 mg at 12/09/13 0825;  gabapentin (NEURONTIN) tablet 600 mg, 600 mg, Oral, QHS, [name], DO, 600 mg at 12/08/13 2143 hydrochlorothiazide (HYDRODIURIL) tablet 25 mg, 25 mg, Oral, Daily, Lupita Dawn, MD, 25 mg at 12/09/13 0959;  insulin aspart (novoLOG) injection 0-15 Units, 0-15 Units, Subcutaneous, TID WC, [name], DO, 5 Units at 12/08/13 1811;  insulin aspart (novoLOG) injection 0-5 Units, 0-5 Units, Subcutaneous, QHS, [name], DO, 4 Units at 12/07/13 2155 insulin glargine (LANTUS) injection 30 Units, 30 Units, Subcutaneous, QHS, Vance Gather, MD, 30 Units at 12/08/13 2143;  lactated ringers infusion, , Intravenous, Continuous, Finis Bud, MD, Last Rate: 10 mL/hr at 12/04/13 1452;  lisinopril (PRINIVIL,ZESTRIL) tablet 20 mg, 20 mg, Oral, Daily, Lupita Dawn, MD, 20 mg at 12/09/13 4403 nicotine (NICODERM CQ - dosed in mg/24 hours) patch 14 mg, 14 mg, Transdermal, Daily, [name], DO, 14 mg at 12/09/13 1000;  ondansetron (ZOFRAN) injection 4 mg, 4 mg, Intravenous, Q6H PRN, [name], DO;  ondansetron (ZOFRAN) tablet 4 mg, 4 mg, Oral, Q6H PRN, [name], DO;  oxyCODONE (Oxy IR/ROXICODONE) immediate release tablet 5-15 mg, 5-15 mg, Oral, Q3H PRN, Mcarthur Rossetti, MD, 15 mg at 12/09/13 4742 pantoprazole (PROTONIX) EC tablet 40 mg, 40 mg, Oral, QHS, Lupita Dawn, MD, 40 mg at 12/08/13 2145;  rivaroxaban (XARELTO) tablet 20 mg, 20 mg, Oral, Q supper, Dayna N Dunn, PA-C, 20 mg at 12/08/13 1811;  sodium chloride 0.9 % injection 3 mL, 3 mL, Intravenous, Q12H, [name], DO, 3 mL at 12/09/13 5956;  thiamine (VITAMIN B-1) tablet 100 mg, 100 mg, Oral, Daily, Lupita Dawn, MD, 100 mg at 12/09/13 3875  Patients Current Diet: Carb Control  Precautions /  Restrictions Precautions Precautions: Fall Restrictions Weight Bearing Restrictions: Yes RLE Weight Bearing: Non weight bearing LLE Weight Bearing: Weight bearing as tolerated Other Position/Activity Restrictions: L LE WBAT according to old notes, pt has no recollection of any WBing status.     Prior Activity Level Limited Community (1-2x/wk): Went out about 3 X a week.  Home Assistive Devices / Equipment Home Assistive Devices/Equipment: Eyeglasses Home Equipment: Walker - 2 wheels;Cane - single point;Wheelchair - power  Prior Functional Level Prior Function Level of Independence: Needs assistance Gait / Transfers Assistance Needed: Using crutches per pt, however during last admit was using RW.   ADL's / [employer]  Needed: pt states he has "a lady" who cleans for him.    Current Functional Level Cognition  Overall Cognitive Status: Within Functional Limits for tasks assessed Orientation Level: Oriented X4    Extremity Assessment (includes Sensation/Coordination)  Upper Extremity Assessment: Overall WFL for tasks assessed  Lower Extremity Assessment: RLE deficits/detail;LLE deficits/detail  RLE Deficits / Details: New BKA.  LLE Deficits / Details: pt with recent L first and second ray amputations. pt indicates pain in foot during mobility.    ADLs  Evaluation pending; anticipate ADL needs.    Mobility  Overal bed mobility: Modified Independent    Transfers  Overall transfer level: Needs assistance Equipment used: None Transfers: Squat Pivot Transfers;Sit to/from Stand Sit to Stand: Mod assist;Min assist;From elevated surface Squat pivot transfers: Min assist;From elevated surface General transfer comment: squat-pivot bed to chair on his left; from chair, stood x 2 with stedy (once with foot on platform which made it difficult due to foot on higher surface; once with stedy reversed and foot on floor).    Ambulation / Gait / Stairs / Wheelchair Mobility  Not  tried on acute   Posture / Balance Sitting balance-Leahy Scale: Good  Standing balance support: Bilateral upper extremity supported  Standing balance-Leahy Scale: Poor  Standing balance comment: stood up to 60 seconds   Special needs/care consideration BiPAP/CPAP No CPM No Continuous Drip IV No Dialysis No     Life Vest No Oxygen No Special Bed No Trach Size No Wound Vac (area) No      Skin Has R BKA incision with dressing and stump wrap.  Has left big toe amputation and left small toe amputation with sutures in left big toe.  Dressing on left foot.                    Bowel mgmt: Last BM 12/05/13 Bladder mgmt: Voiding in urinal at bedside. Diabetic mgmt No    Previous Home Environment Living Arrangements: Alone Available Help at Discharge: Friend(s) (states he can call friends to help as needed) Type of Home: Apartment Home Layout: One level Home Access: Level entry [place]: No  Discharge Living Setting Plans for Discharge Living Setting: Alone;Apartment Type of Home at Discharge: Apartment Discharge Home Layout: One level Discharge Home Access: Ramped entrance Does the patient have any problems obtaining your medications?: Yes (Describe)  Social/Family/Support Systems Patient Roles: Other (Comment) (Single with no children.) Contact Information: [name] - sister [phone] Anticipated Caregiver: self Anticipated Caregiver's Contact Information: Has a friend, Ed, [phone] Ability/Limitations of Caregiver: No caregiver.  Has mod I goals. Caregiver Availability: Other (Comment) (Family can check on him periodically.) Discharge Plan Discussed with Primary Caregiver: Yes Is Caregiver In Agreement with Plan?: Yes Does Caregiver/Family have Issues with Lodging/Transportation while Pt is in Rehab?: No  Goals/Additional Needs Patient/Family Goal for Rehab: PT/OT mod I goals Expected length of stay: 7-10 days Cultural Considerations: Christian Dietary  Needs: Carb mod med cal, thin liquids Equipment Needs: TBD Pt/Family Agrees to Admission and willing to participate: Yes Program Orientation Provided & Reviewed with Pt/Caregiver Including Roles  & Responsibilities: Yes  Decrease burden of Care through IP rehab admission: N/A  Possible need for SNF placement upon discharge: Not planned  Patient Condition: This patient's condition remains as documented in the consult dated 12/08/13, in which the Rehabilitation Physician determined and documented that the patient's condition is appropriate for intensive rehabilitative care in an inpatient rehabilitation facility pending follow up foot care for left  foot. These areas have been addressed. See above Note:  Also patient is WBAT on left foot.  Will admit to inpatient rehab today.  Preadmission Screen Completed By:  Retta Diones, 12/09/2013 12:14 PM ______________________________________________________________________   Discussed status with Dr. Letta Pate on 12/09/13 at 1235 and received telephone approval for admission today.  Admission Coordinator:  Retta Diones, time1235/Date06/30/15

## 2013-12-09 NOTE — Progress Notes (Signed)
Patient arrived on unit about 1700. Patient alert and oriented x3. Patient oriented to rehab process and therapy schedule. Patient denied any questions at this time. Vitals stable. Belongings at bedside. Kerlix applied to left foot wound until dressing supplies arrive. Continue with plan of care.

## 2013-12-09 NOTE — Progress Notes (Signed)
Physical Therapy Treatment Patient Details Name: Steven [name] MRN: [id number] DOB: 30-Apr-1961 Today's Date: 12/09/2013    History of Present Illness Adm 12/03/13 with infected foot ulcers Rt foot, with sepsis and new onset atrial fibrillation. 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      PT Comments    Very motivated to return to independence; Noted plans to dc to CIR today; Anticipate good progress at CIR  Follow Up Recommendations  CIR     Equipment Recommendations  3in1 (PT)    Recommendations for Other Services OT consult     Precautions / Restrictions Precautions Precautions: Fall Restrictions RLE Weight Bearing: Non weight bearing LLE Weight Bearing: Weight bearing as tolerated Other Position/Activity Restrictions: L LE WBAT according to old notes, pt has no recollection of any WBing status.      Mobility  Bed Mobility Overal bed mobility: Modified Independent             General bed mobility comments: No cues or assist needed  Transfers Overall transfer level: Needs assistance Equipment used: Rolling walker (2 wheeled) Transfers: Sit to/from Stand Sit to Stand: Min assist;Mod assist         General transfer comment: Good rise with bed slightly elevated; requires assist for steadiness with transition of hands from bed to RW; performed 3 reps, and second rep pt did have a loss of balance resulting in unplanned sit back to bed  Ambulation/Gait Ambulation/Gait assistance: Min assist Ambulation Distance (Feet): 20 Feet (5+15) Assistive device: Rolling walker (2 wheeled)       General Gait Details: verbal and demo cues to more press body weight into RW with bil UEs than hop on L foot; Good use of RW for stability   Stairs            Wheelchair Mobility    Modified Rankin (Stroke Patients Only)       Balance     Sitting  balance-Leahy Scale: Good     Standing balance support: Bilateral upper extremity supported Standing balance-Leahy Scale: Poor                      Cognition Arousal/Alertness: Awake/alert Behavior During Therapy: WFL for tasks assessed/performed Overall Cognitive Status: Within Functional Limits for tasks assessed                      Exercises Amputee Exercises Quad Sets: 5 reps    General Comments        Pertinent Vitals/Pain 10/10 pain RLE, including the sensation of R foot itching RN provided medication to assist with pain control patient repositioned for comfort     Home Living                      Prior Function            PT Goals (current goals can now be found in the care plan section) Acute Rehab PT Goals Patient Stated Goal: Get a prosthesis, go home PT Goal Formulation: With patient Time For Goal Achievement: 12/19/13 Potential to Achieve Goals: Good Progress towards PT goals: Progressing toward goals    Frequency  Min 3X/week    PT Plan Current plan remains appropriate    Co-evaluation             End of Session Equipment Utilized During Treatment:  Gait belt Activity Tolerance: Patient tolerated treatment well Patient left: in bed;with call bell/phone within reach     Time: 1158-1230 PT Time Calculation (min): 32 min  Charges:  $Gait Training: 23-37 mins                    G Codes:      Roney Marion Hamff 12/09/2013, 1:04 PM  Roney Marion, [place] Pager [phone] Office [phone]

## 2013-12-09 NOTE — Progress Notes (Signed)
Family Medicine Teaching Service Daily Progress Note Intern Pager: [phone]  Patient name: Steven [name] Medical record number: [id number] Date of birth: 1960/09/06 Age: 53 y.o. Gender: male  Primary Care Tamme Mozingo: Chrisandra Netters, MD Consultants: Orthopedics, cardiology Code Status: Full  Pt Overview and Major Events to Date:  6/24: Admitted for RLE osteomyelitis and gangrene; vanc/zosyn/clinda started 6/25: Transtibial amputation of the right lower extremity 6/27: De-escalate antibiotics to PO clinda; Start xarelto 6/29: Leukocytosis resolved, D/C clindamycin.  6/30: PM&R recommending CIR, needs ortho instructions on post-operative care and weight bearing.   Assessment and Plan: Steven [name] is a 52 y.o. male presenting with severe sepsis due to worsening infected Left-foot DM ulcers, found to have osteomyelitis and gas-gangrene, additionally found to be in new onset AFib with RVR. PMH is significant for DM-2 (poorly controlled, complicated peripheral neuropathy), multiple bilateral toe amputations (Dr. Sharol Given) with h/o osteomyelitis, HTN, PVD.   Osteomyelitis with gas gangrene: POD #5 s/p transtibial amputation of RLE (6/25) - Pain control: Fentanyl 25-75mg IV q 2 hr PRN, oxycodone 15 mg q 3PRN, Tylenol, Gabapentin (home) - PM&R recommending CIR, needs ortho instructions on post-operative care and weight bearing.   Sepsis: Resolved - blood cultures (6/24): Neg  Paroxysmal atrial fibrillation with RVR, unknown duration: Reverted to NSR - Diltiazem 1223mdaily, avoid BB as cocaine + - CHADs-VASC score 3: continue xarelto per cardiology - Echocardiogram: EF 55-60%, no wall motion abnormalities - Ischemic work up as outpatient  Acute blood loss anemia: Continues to be stable  PAD: shown with ABIs 2014  Acute Kidney Injury: Resolved - Restart ACE inhibitor  T2DM, uncontrolled: Hb A1c 14.6% - CBGs mostly 100s, range 90-203, continue lantus 30u - moderate SSI  - Monitor  insulin requirement on hospital diet and titrate as necessary  Hyponatremia, hypovolemic: stable between 133-134  Hypokalemia, mild: Resolved, K 4.7  HTN  - Restarting lisinopril-HCTZ - Started dilt as above  HLD  - Resume home statin   Tobacco abuse  - Active smoker 0.5ppd, >30 yrs  - Nicotine patch 141maily   FEN/GI: KVO IVF, carb mod diet Prophylaxis: Xarelto  Disposition: Stable for disposition.  Subjective:  Pain well controlled. Denies CP, SOB, palpitations.   Objective: Temp:  [98.4 F (36.9 C)-99.4 F (37.4 C)] 98.8 F (37.1 C) (06/30 0517) Pulse Rate:  [76-97] 84 (06/30 0517) Resp:  [16-18] 16 (06/30 0517) BP: (120-134)/(75-88) 132/88 mmHg (06/30 0517) SpO2:  [97 %-98 %] 97 % (06/30 0517) Physical Exam: General: [age]. male resting quietly in NAD Cardiovascular: RRR no murmur, NSR on ECG Respiratory: Non-labored, slight crackles at the bases BL Abdomen: +BS, soft, NT, ND Extremities: LLE: with bandage; RLE s/p transtibial amputation, bandage in place c/d/i  Laboratory:  Recent Labs Lab 12/07/13 0600 12/08/13 0400 12/09/13 0555  WBC 11.2* 9.4 8.0  HGB 9.3* 9.2* 9.9*  HCT 28.3* 27.6* 29.7*  PLT 362 395 409*    Recent Labs Lab 12/07/13 0600 12/08/13 0400 12/09/13 0555  NA 133* 133* 134*  K 4.3 4.0 4.0  CL 96 96 96  CO2 '23 25 25  ' BUN 5* 5* 5*  CREATININE 0.66 0.67 0.67  CALCIUM 9.0 9.0 9.2  GLUCOSE 127* 179* 99   Lactic Acid - 1.89  Troponin - negative x1  ESR - 120 CRP - 28.7 TSH - 0.457 Cortisol - 11.1 HgbA1c - 14.6 Blood Cultures (x 2, collected 12/03/13)   6/24 CXR 2v  FINDINGS:  Low lung volumes. The heart size and mediastinal  contours are within  normal limits. Both lungs are clear. The visualized skeletal  structures are unremarkable.  IMPRESSION:  No active cardiopulmonary disease.   6/24 Right Foot, complete  IMPRESSION:  Diffuse hindfoot swelling and gas in the soft tissues worrisome for  infection (gas  gangrene).  Open wound near the base of the fifth metatarsal with underlying  osteomyelitis.  Vance Gather, MD 12/09/2013, 8:23 AM PGY-1, [place] Intern pager: [phone], text pages welcome

## 2013-12-09 NOTE — H&P (Signed)
Physical Medicine and Rehabilitation Admission H&P  Chief Complaint   Patient presents with   .  R-BKA and poorly healing left foot wound.   HPI: Steven [name] is a 53 y.o. male with h/o DM type 2--poorly controlled with peripheral neuropathy, HTN, multiple bilateral diabetic foot ulcers with osteomyelitis and infection who had refused BKA in the past but was admitted on 12/03/13 with severe sepsis due to worsening of right foot ulcers with purulent drainage as well as gas gangrene. He was noted to have A fib with RVR and UDS + cocaine. No BB in setting of cocaine and long term anticoagulation recommended by Dr. Stanford Breed. 2D echo with EF 55-65% and no wall abnormality noted. 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 Cardiology recommends ischemic work up once he recovers from surgery. PT evaluation done and patient with heavy reliance on UE. Patient with left foot wound with drainage and Dr. Jess Barters office contacted for input. Sutures to stay in place for a week and silvadene/nitro patches recommended for wound care. He lives alone and has medicaid--CM and MD recommending CIR for progressive therapies due to insurance constraints   Patient states pain is well controlled at the current time Review of Systems  HENT: Negative for hearing loss.  Eyes: Negative for blurred vision and double vision.  Respiratory: Negative for cough, shortness of breath and wheezing.  Cardiovascular: Negative for chest pain.  Gastrointestinal: Negative for heartburn, nausea and constipation.  Genitourinary: Negative for urgency and frequency.  Musculoskeletal: Negative for back pain, joint pain and myalgias.  Neurological: Positive for weakness. Negative for dizziness, tingling and headaches.   Past Medical History   Diagnosis  Date   .  Uncontrolled diabetes mellitus    .  HTN (hypertension)    .  History of DVT (deep vein thrombosis)    .  Personal  history of diabetic foot ulcer    .  Peripheral vascular disease      a. Abnl ABIs 2014.   Marland Kitchen  Peripheral neuropathy  11/19/2011   .  Varicose vein      legs   .  Osteomyelitis of foot, right, acute  08/30/2012   .  Personal history of colonic adenomas  05/29/2013   .  History of cocaine use    .  PAF (paroxysmal atrial fibrillation)      a. Dx 11/2013 in setting of sepsis/foot infection.   .  Tobacco abuse    .  H/O ETOH abuse     Past Surgical History   Procedure  Laterality  Date   .  Amputation   04/21/2011     Procedure: AMPUTATION RAY; Surgeon: Newt Minion, MD; Location: [place]; Service: Orthopedics; Laterality: Right; Rt foot 2nd ray ampt   .  I&d extremity   05/03/2011     Procedure: IRRIGATION AND DEBRIDEMENT EXTREMITY; Surgeon: Newt Minion, MD; Location: [place]; Service: Orthopedics; Laterality: Right; Irrigation and Debridement Right Foot and Place Acell Xenograft   .  Toe amputation   04/24/2012     great toe right foot   .  Amputation   04/24/2012     Procedure: AMPUTATION RAY; Surgeon: Newt Minion, MD; Location: [place]; Service: Orthopedics; Laterality: Right; Right foot 1st ray amputation   .  Amputation  Left  04/23/2013     Procedure: AMPUTATION RAY; Surgeon: Newt Minion, MD; Location: [place]; Service: Orthopedics; Laterality: Left; Left Foot  5th Ray Amputation   .  Colonoscopy     .  Amputation  Left  11/21/2013     Procedure: AMPUTATION RAY; Surgeon: Newt Minion, MD; Location: [place]; Service: Orthopedics; Laterality: Left; Left Foot 1st & 2nd Ray Amputation   .  Amputation  Right  12/04/2013     Procedure: AMPUTATION BELOW KNEE; Surgeon: Marianna Payment, MD; Location: [place]; Service: Orthopedics; Laterality: Right;    Family History   Problem  Relation  Age of Onset   .  Diabetes  Mother    .  Stroke  Brother    .  Colon cancer  Neg Hx     Social History: Lives alone. Disabled due to medical issues--has an aide 3 days/week to help with housework. Used walker  in home and wheelchair when out of home. He reports that he has been smoking Cigarettes--1/2 PPD. He has a 15 pack-year smoking history. He has never used smokeless tobacco. He reports that he drinks about 2.5 - 3 ounces of alcohol per week. He reports that he does not use illicit drugs--reports cocaine was "one time experiment  Allergies: No Known Allergies  Medications Prior to Admission   Medication  Sig  Dispense  Refill   .  albuterol (PROVENTIL HFA;VENTOLIN HFA) 108 (90 BASE) MCG/ACT inhaler  Inhale 2 puffs into the lungs every 6 (six) hours as needed for wheezing or shortness of breath.  1 Inhaler  0   .  atorvastatin (LIPITOR) 40 MG tablet  Take 1 tablet (40 mg total) by mouth daily.  90 tablet  0   .  cetirizine (ZYRTEC) 10 MG tablet  Take 1 tablet (10 mg total) by mouth daily.  30 tablet  5   .  doxycycline (VIBRAMYCIN) 100 MG capsule  Take 100 mg by mouth 2 (two) times daily.     .  fluticasone (FLONASE) 50 MCG/ACT nasal spray  Place 2 sprays into both nostrils daily.  16 g  1   .  gabapentin (NEURONTIN) 300 MG capsule  Take 300-600 mg by mouth 3 (three) times daily. Take 386m in the morning, take 3015mat lunch, and take 60058mt bedtime.     .  hydrocortisone cream 1 %  Apply 1 application topically 2 (two) times daily as needed for itching.     .  insulin glargine (LANTUS) 100 UNIT/ML injection  Inject 30 Units into the skin at bedtime.     .  Marland Kitchenisinopril-hydrochlorothiazide (PRINZIDE,ZESTORETIC) 20-25 MG per tablet  Take 1 tablet by mouth daily.  90 tablet  0    Home:  Home Living  Family/patient expects to be discharged to:: Unsure  Living Arrangements: Alone  Available Help at Discharge: Friend(s) (states he can call friends to help as needed)  Type of Home: Apartment  Home Access: Level entry  Home Layout: One level  Home Equipment: Walker - 2 wheels;Cane - single point;Wheelchair - power  Functional History:  Prior Function  Level of Independence: Needs assistance  Gait /  Transfers Assistance Needed: Using crutches per pt, however during last admit was using RW.  ADL's / Homemaking Assistance Needed: pt states he has "a lady" who cleans for him.  Functional Status:  Mobility:  Bed Mobility  Overal bed mobility: Modified Independent  Transfers  Overall transfer level: Needs assistance  Equipment used: None  Transfers: Squat Pivot Transfers;Sit to/from Stand  Sit to Stand: Mod assist;Min assist;From elevated surface  Squat pivot transfers: Min assist;From elevated  surface  General transfer comment: squat-pivot bed to chair on his left; from chair, stood x 2 with stedy (once with foot on platform which made it difficult due to foot on higher surface; once with stedy reversed and foot on floor).    ADL:   Cognition:  Cognition  Overall Cognitive Status: Within Functional Limits for tasks assessed  Orientation Level: Oriented X4  Cognition  Arousal/Alertness: Awake/alert  Behavior During Therapy: WFL for tasks assessed/performed  Overall Cognitive Status: Within Functional Limits for tasks assessed  Physical Exam:  Blood pressure 126/70, pulse 82, temperature 98.6 F (37 C), temperature source Oral, resp. rate 18, height _0  (1.93 m), weight 94.892 kg (209 lb 3.2 oz), SpO2 100.00%.  Physical Exam  Nursing note and vitals reviewed.  Constitutional: He is oriented to person, place, and time. He appears well-developed and well-nourished.  HENT:  Head: Normocephalic and atraumatic.  Eyes: Conjunctivae are normal. Pupils are equal, round, and reactive to light.  Neck: Normal range of motion. Neck supple.  Cardiovascular: Normal rate and regular rhythm.  No murmur heard.  Respiratory: Effort normal and breath sounds normal. No respiratory distress. He has no wheezes.  GI: Soft. Bowel sounds are normal. He exhibits no distension. There is no tenderness.  Musculoskeletal:  R-BKA with coban compressive dressing. Left trans met amputation with sutures in  place and ischemic edges with 3 open areas . 2+ edema left foot.  Neurological: He is alert and oriented to person, place, and time.  Skin: Skin is warm and dry.  Motor: 5/5 bilateral deltoid, bicep, tricep, grip 4/5 left hip flexor knee extensor 4/5 right hip flexor, trace left ankle dorsiflexor plantar flexor 0 toe flexion-extension Results for orders placed during the hospital encounter of 12/03/13 (from the past 48 hour(s))   GLUCOSE, CAPILLARY Status: Abnormal    Collection Time    12/07/13 4:41 PM   Result  Value  Ref Range    Glucose-Capillary  225 (*)  70 - 99 mg/dL   GLUCOSE, CAPILLARY Status: Abnormal    Collection Time    12/07/13 9:31 PM   Result  Value  Ref Range    Glucose-Capillary  303 (*)  70 - 99 mg/dL   CBC Status: Abnormal    Collection Time    12/08/13 4:00 AM   Result  Value  Ref Range    WBC  9.4  4.0 - 10.5 K/uL    RBC  3.20 (*)  4.22 - 5.81 MIL/uL    Hemoglobin  9.2 (*)  13.0 - 17.0 g/dL    HCT  27.6 (*)  39.0 - 52.0 %    MCV  86.3  78.0 - 100.0 fL    MCH  28.8  26.0 - 34.0 pg    MCHC  33.3  30.0 - 36.0 g/dL    RDW  14.8  11.5 - 15.5 %    Platelets  395  150 - 400 K/uL   BASIC METABOLIC PANEL Status: Abnormal    Collection Time    12/08/13 4:00 AM   Result  Value  Ref Range    Sodium  133 (*)  137 - 147 mEq/L    Potassium  4.0  3.7 - 5.3 mEq/L    Chloride  96  96 - 112 mEq/L    CO2  25  19 - 32 mEq/L    Glucose, Bld  179 (*)  70 - 99 mg/dL    BUN  5 (*)  6 -  23 mg/dL    Creatinine, Ser  0.67  0.50 - 1.35 mg/dL    Calcium  9.0  8.4 - 10.5 mg/dL    GFR calc non Af Amer  >90  >90 mL/min    GFR calc Af Amer  >90  >90 mL/min    Comment:  (NOTE)     The eGFR has been calculated using the CKD EPI equation.     This calculation has not been validated in all clinical situations.     eGFR's persistently <90 mL/min signify possible Chronic Kidney     Disease.   GLUCOSE, CAPILLARY Status: None    Collection Time    12/08/13 8:25 AM   Result  Value  Ref  Range    Glucose-Capillary  88  70 - 99 mg/dL    Comment 1  Notify RN     Comment 2  Documented in Chart    GLUCOSE, CAPILLARY Status: Abnormal    Collection Time    12/08/13 11:41 AM   Result  Value  Ref Range    Glucose-Capillary  270 (*)  70 - 99 mg/dL    Comment 1  Notify RN    GLUCOSE, CAPILLARY Status: Abnormal    Collection Time    12/08/13 4:56 PM   Result  Value  Ref Range    Glucose-Capillary  201 (*)  70 - 99 mg/dL   GLUCOSE, CAPILLARY Status: Abnormal    Collection Time    12/08/13 8:31 PM   Result  Value  Ref Range    Glucose-Capillary  199 (*)  70 - 99 mg/dL   CBC Status: Abnormal    Collection Time    12/09/13 5:55 AM   Result  Value  Ref Range    WBC  8.0  4.0 - 10.5 K/uL    RBC  3.46 (*)  4.22 - 5.81 MIL/uL    Hemoglobin  9.9 (*)  13.0 - 17.0 g/dL    HCT  29.7 (*)  39.0 - 52.0 %    MCV  85.8  78.0 - 100.0 fL    MCH  28.6  26.0 - 34.0 pg    MCHC  33.3  30.0 - 36.0 g/dL    RDW  14.7  11.5 - 15.5 %    Platelets  409 (*)  150 - 400 K/uL   BASIC METABOLIC PANEL Status: Abnormal    Collection Time    12/09/13 5:55 AM   Result  Value  Ref Range    Sodium  134 (*)  137 - 147 mEq/L    Potassium  4.0  3.7 - 5.3 mEq/L    Chloride  96  96 - 112 mEq/L    CO2  25  19 - 32 mEq/L    Glucose, Bld  99  70 - 99 mg/dL    BUN  5 (*)  6 - 23 mg/dL    Creatinine, Ser  0.67  0.50 - 1.35 mg/dL    Calcium  9.2  8.4 - 10.5 mg/dL    GFR calc non Af Amer  >90  >90 mL/min    GFR calc Af Amer  >90  >90 mL/min    Comment:  (NOTE)     The eGFR has been calculated using the CKD EPI equation.     This calculation has not been validated in all clinical situations.     eGFR's persistently <90 mL/min signify possible Chronic Kidney     Disease.  GLUCOSE, CAPILLARY Status: Abnormal    Collection Time    12/09/13 7:49 AM   Result  Value  Ref Range    Glucose-Capillary  104 (*)  70 - 99 mg/dL   GLUCOSE, CAPILLARY Status: Abnormal    Collection Time    12/09/13 11:37 AM   Result   Value  Ref Range    Glucose-Capillary  171 (*)  70 - 99 mg/dL    No results found.  Medical Problem List and Plan:  1. Functional deficits secondary to R-BKA and recent left transmet amputation.  2. DVT Prophylaxis/Anticoagulation: Pharmaceutical: Xarelto  3. Pain Management: Continue neurontin for neuropathy  4. Mood: Will have LCSW follow for evaluation and support.  5. Neuropsych: This patient is capable of making decisions on his own behalf.  6. DM type 2: Will monitor with ac/hs checks. Continue lantus 30 units at bedtime with SSI for elevated BS. Titrate lantus for better control. Educate patient on importance of medication compliance.  7. A fib: Will monitor HR every 8 hours. Continue Cardizem and Xarelto.  8. HTN: Monitor BP every 8 hours. Continue HCTZ, lisinopril and Cardizem.  9. ABLA: Will add iron supplement. Recheck in am.  10. Hyponatremia: Due to sepsis resolving. Will recheck in am.  11. Left transmet amputation: Shows signs of ischemia. Will ask surgeon to evaluate and resume doxycyline. Will add silvadene and NTG patch per recommendations. Will order cast shoe for protection.  Post Admission Physician Evaluation:  1. Functional deficits secondary to right BKA , left first and second ray amputation. 2. Patient is admitted to receive collaborative, interdisciplinary care between the physiatrist, rehab nursing staff, and therapy team. 3. Patient's level of medical complexity and substantial therapy needs in context of that medical necessity cannot be provided at a lesser intensity of care such as a SNF. 4. Patient has experienced substantial functional loss from his/her baseline which was documented above under the "Functional History" and "Functional Status" headings. Judging by the patient's diagnosis, physical exam, and functional history, the patient has potential for functional progress which will result in measurable gains while on inpatient rehab. These gains will be of  substantial and practical use upon discharge in facilitating mobility and self-care at the household level. 5. Physiatrist will provide 24 hour management of medical needs as well as oversight of the therapy plan/treatment and provide guidance as appropriate regarding the interaction of the two. 6. 24 hour rehab nursing will assist with bladder management, bowel management, safety, skin/wound care, disease management, medication administration, pain management and patient education and help integrate therapy concepts, techniques,education, etc. 7. PT will assess and treat for/with: pre gait, gait training, endurance , safety, equipment, neuromuscular re education. Goals are: Sup. 8. OT will assess and treat for/with: ADLs, Cognitive perceptual skills, Neuromuscular re education, safety, endurance, equipment. Goals are: Mod I. 9. SLP will assess and treat for/with: NA. Goals are: NA. 10. Case Management and Social Worker will assess and treat for psychological issues and discharge planning. 11. Team conference will be held weekly to assess progress toward goals and to determine barriers to discharge. 12. Patient will receive at least 3 hours of therapy per day at least 5 days per week. 13. ELOS: 7-10d  14. Prognosis: excellent Charlett Blake M.D. [facility] FAAPM&R (Sports Med, Neuromuscular Med) Diplomate Am Board of Electrodiagnostic Med   12/09/2013

## 2013-12-10 ENCOUNTER — Inpatient Hospital Stay (HOSPITAL_COMMUNITY): Payer: Medicaid Other | Admitting: Physical Therapy

## 2013-12-10 ENCOUNTER — Inpatient Hospital Stay (HOSPITAL_COMMUNITY): Payer: Medicaid Other | Admitting: Occupational Therapy

## 2013-12-10 ENCOUNTER — Inpatient Hospital Stay (HOSPITAL_COMMUNITY): Payer: Medicaid Other

## 2013-12-10 DIAGNOSIS — M869 Osteomyelitis, unspecified: Secondary | ICD-10-CM | POA: Insufficient documentation

## 2013-12-10 DIAGNOSIS — S88119A Complete traumatic amputation at level between knee and ankle, unspecified lower leg, initial encounter: Secondary | ICD-10-CM

## 2013-12-10 LAB — GLUCOSE, CAPILLARY
Glucose-Capillary: 169 mg/dL — ABNORMAL HIGH (ref 70–99)
Glucose-Capillary: 147 mg/dL — ABNORMAL HIGH (ref 70–99)
Glucose-Capillary: 238 mg/dL — ABNORMAL HIGH (ref 70–99)
Glucose-Capillary: 168 mg/dL — ABNORMAL HIGH (ref 70–99)

## 2013-12-10 LAB — CULTURE, BLOOD (ROUTINE X 2): Culture: NO GROWTH

## 2013-12-10 NOTE — Discharge Summary (Signed)
Family Medicine Teaching Service  Discharge Note : Attending Gracelin Weisberg MD Pager [phone] Office [phone] I have seen and examined this patient, reviewed their chart and discussed discharge planning wit the resident at the time of discharge. I agree with the discharge plan as above.  

## 2013-12-10 NOTE — Evaluation (Signed)
Physical Therapy Assessment and Plan  Patient Details  Name: Steven [name] MRN: [id number] Date of Birth: 1961/04/06  PT Diagnosis: Difficulty walking, Impaired sensation, Muscle weakness and Pain in RLE and L foot Rehab Potential: Good ELOS: 5-7 days   Today's Date: 12/10/2013 Time: 0730-0830 Time Calculation (min): 60 min  Problem List:  Patient Active Problem List   Diagnosis Date Noted  . Cocaine abuse 12/05/2013  . Tobacco abuse 12/04/2013  . History of cocaine use   . Peripheral vascular disease   . Atrial fibrillation with RVR 12/03/2013  . Severe sepsis 12/03/2013  . Osteomyelitis of ankle or foot, right, acute 12/03/2013  . Osteomyelitis of ankle or foot, acute 11/21/2013  . Gangrene of foot 11/21/2013  . 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 Personal history of colonic adenomas 05/29/2013  . Diabetic retinopathy 12/25/2012  . HLD (hyperlipidemia) 12/08/2012  . Osteomyelitis of foot 08/30/2012  . Peripheral neuropathy 11/19/2011  . ETOH abuse 03/29/2011  . HTN (hypertension) 12/22/2010  . ONYCHOMYCOSIS, TOENAILS 07/18/2006  . Diabetes mellitus type II, uncontrolled 07/18/2006  . VENOUS INSUFFICIENCY 07/18/2006  . CALLUSES, FEET, BILATERAL 07/18/2006    Past Medical History:  Past Medical History  Diagnosis Date  . Uncontrolled diabetes mellitus   . HTN (hypertension)   . History of DVT (deep vein thrombosis)   . Personal history of diabetic foot ulcer   . Peripheral vascular disease     a. Abnl ABIs 2014.  Marland Kitchen Peripheral neuropathy 11/19/2011  . Varicose vein     legs  . Osteomyelitis of foot, right, acute 08/30/2012  . Personal history of colonic adenomas 05/29/2013  . History of cocaine use   . PAF (paroxysmal atrial fibrillation)     a. Dx 11/2013 in setting of sepsis/foot infection.  . Tobacco abuse   . H/O ETOH abuse    Past Surgical History:  Past Surgical History  Procedure Laterality Date  . Amputation  04/21/2011    Procedure: AMPUTATION RAY;   Surgeon: Newt Minion, MD;  Location: [place];  Service: Orthopedics;  Laterality: Right;  Rt foot 2nd ray ampt  . I&d extremity  05/03/2011    Procedure: IRRIGATION AND DEBRIDEMENT EXTREMITY;  Surgeon: Newt Minion, MD;  Location: [place];  Service: Orthopedics;  Laterality: Right;  Irrigation and Debridement Right Foot and Place Acell Xenograft  . Toe amputation  04/24/2012    great toe   right foot  . Amputation  04/24/2012    Procedure: AMPUTATION RAY;  Surgeon: Newt Minion, MD;  Location: [place];  Service: Orthopedics;  Laterality: Right;  Right foot 1st ray amputation  . Amputation Left 04/23/2013    Procedure: AMPUTATION RAY;  Surgeon: Newt Minion, MD;  Location: [place];  Service: Orthopedics;  Laterality: Left;  Left Foot 5th Ray Amputation  . Colonoscopy    . Amputation Left 11/21/2013    Procedure: AMPUTATION RAY;  Surgeon: Newt Minion, MD;  Location: [place];  Service: Orthopedics;  Laterality: Left;  Left Foot 1st & 2nd Ray Amputation  . Amputation Right 12/04/2013    Procedure: AMPUTATION BELOW KNEE;  Surgeon: Marianna Payment, MD;  Location: [place];  Service: Orthopedics;  Laterality: Right;    Assessment & Plan Clinical Impression: Patient is a 53 y.o. year old male with recent admission to the hospital on 12/03/13 with severe sepsis due to worsening of right foot ulcers with purulent drainage as well as gas gangrene. He underwent R-transtibial amputation by Dr. Erlinda Hong  on 12/04/13.  Patient transferred to [facility] on 12/09/2013 .   Patient currently requires min with mobility secondary to muscle weakness and decreased sitting balance, decreased standing balance, decreased postural control and decreased balance strategies.  Prior to hospitalization, patient was modified independent  with mobility and lived with Alone in a [place] home.  Home access is  Level entry.  Patient will benefit from skilled PT intervention to maximize safe functional mobility, minimize fall risk and decrease  caregiver burden for planned discharge home with intermittent assist.  Anticipate patient will benefit from follow up [facility] at discharge.  PT - End of Session Activity Tolerance: Tolerates 30+ min activity with multiple rests Endurance Deficit: Yes PT Assessment Rehab Potential: Good Barriers to Discharge: Decreased caregiver support PT Patient demonstrates impairments in the following area(s): Balance;Endurance;Pain;Motor;Safety;Sensory PT Transfers Functional Problem(s): Bed Mobility;Bed to Chair;Car;Furniture PT Locomotion Functional Problem(s): Ambulation;Wheelchair Mobility PT Plan PT Intensity: Minimum of 1-2 x/day ,45 to 90 minutes PT Frequency: 5 out of 7 days PT Duration Estimated Length of Stay: 5-7 days PT Treatment/Interventions: Ambulation/gait training;Balance/vestibular training;Community reintegration;Discharge planning;DME/adaptive equipment instruction;Functional mobility training;Pain management;Neuromuscular re-education;Patient/family education;Skin care/wound management;Disease management/prevention;Splinting/orthotics;Therapeutic Activities;Therapeutic Exercise;UE/LE Strength taining/ROM;Wheelchair propulsion/positioning PT Transfers Anticipated Outcome(s): mod I PT Locomotion Anticipated Outcome(s): mod I PT Recommendation Follow Up Recommendations: Home health PT Patient destination: Home Equipment Recommended: To be determined Equipment Details: pt owns: power w/c, RW, SP cane  Skilled Therapeutic Intervention Supervision for bed mobility, supine <> sit using bed rails with HOB elevated as pt reports he has a hospital bed at home.  Stand pivot transfer from bed <> w/c with min A for balance and safety.  Pt propelled w/c 125' with min A due to pain in RLE, required max cueing and min A for w/c safety and parts management.  Sit <> stand from w/c with min A for balance and safety, c/o 10/10 pain in RLE upon standing.  Ambulated 20' using RW with hop to pattern, decreased  step length.  Pt c/o phantom limb pain and "itching" in RLE, PT problem solved pain management with rubbing residual limb, visualization, and use of mirror to visualize RLE movement.  Pain did not decrease with rest, pt transferred back to room in w/c.  RN present and pt sitting EOB upon end of session.  PT Evaluation Precautions/Restrictions Precautions Precautions: Fall Required Braces or Orthoses:  (ACE wrap RLE) Restrictions Weight Bearing Restrictions: Yes RLE Weight Bearing: Non weight bearing LLE Weight Bearing: Weight bearing as tolerated Pain Pt with 0/10 pain at rest, 10/10 pain in RLE upon standing and not easing with rest and repositioning, RN aware and pain meds administered at end of session Home Living/Prior Functioning Home Living Available Help at Discharge: Personal care attendant;Available PRN/intermittently Type of Home: Apartment Home Access: Level entry Home Layout: One level  Lives With: Alone Prior Function Level of Independence: Needs assistance with homemaking;Requires assistive device for independence  Able to Take Stairs?: No Driving: No Vocation: On disability Sensation Sensation Light Touch: Impaired Detail Light Touch Impaired Details: Impaired RLE;Impaired LLE Proprioception: Appears Intact Coordination Gross Motor Movements are Fluid and Coordinated: Yes Motor  Motor Motor: Within Functional Limits  Trunk/Postural Assessment  Cervical Assessment Cervical Assessment: Within Functional Limits Thoracic Assessment Thoracic Assessment: Within Functional Limits Lumbar Assessment Lumbar Assessment: Within Functional Limits Postural Control Postural Control: Within Functional Limits  Balance Balance Balance Assessed: Yes Static Sitting Balance Static Sitting - Balance Support: No upper extremity supported Static Sitting - Level of Assistance: 5: Stand by assistance  Dynamic Sitting Balance Dynamic Sitting - Balance Support: Right upper  extremity supported Dynamic Sitting - Level of Assistance: 5: Stand by assistance Static Standing Balance Static Standing - Balance Support: Bilateral upper extremity supported Static Standing - Level of Assistance: 5: Stand by assistance Dynamic Standing Balance Dynamic Standing - Balance Support: Bilateral upper extremity supported Dynamic Standing - Level of Assistance: 4: Min assist Dynamic Standing - Comments: Balance affected by RLE pain upon standing Extremity Assessment      RLE Assessment RLE Assessment: Exceptions to [facility] RLE PROM (degrees) RLE Overall PROM Comments: knee extension WFL RLE Strength RLE Overall Strength Comments: hip flexion WFL, knee strength NT due to pain LLE Assessment LLE Assessment: Exceptions to [facility] LLE Strength LLE Overall Strength Comments: Overall WFL, ankle DF/PF not formally tested due to pain  FIM:  FIM - Control and instrumentation engineer Devices: Walker;Bed rails;HOB elevated Bed/Chair Transfer: 5: Supine > Sit: Supervision (verbal cues/safety issues);5: Sit > Supine: Supervision (verbal cues/safety issues);4: Bed > Chair or W/C: Min A (steadying Pt. > 75%);4: Chair or W/C > Bed: Min A (steadying Pt. > 75%) FIM - Locomotion: Wheelchair Locomotion: Wheelchair: 2: Travels 50 - 149 ft with minimal assistance (Pt.>75%) FIM - Locomotion: Ambulation Locomotion: Ambulation Assistive Devices: Administrator Ambulation/Gait Assistance: 4: Min assist Locomotion: Ambulation: 1: Travels less than 50 ft with minimal assistance (Pt.>75%) FIM - Locomotion: Stairs Locomotion: Stairs: 0: Activity did not occur   Refer to Care Plan for Long Term Goals  Recommendations for other services: None  Discharge Criteria: Patient will be discharged from PT if patient refuses treatment 3 consecutive times without medical reason, if treatment goals not met, if there is a change in medical status, if patient makes no progress towards goals or if  patient is discharged from hospital.  The above assessment, treatment plan, treatment alternatives and goals were discussed and mutually agreed upon: by patient  Kenn File 12/10/2013, 8:44 AM

## 2013-12-10 NOTE — Progress Notes (Signed)
Patient information reviewed and entered into eRehab system by Lucero Ide, RN, CRRN, PPS Coordinator.  Information including medical coding and functional independence measure will be reviewed and updated through discharge.    

## 2013-12-10 NOTE — Evaluation (Signed)
Reviewed and in agreement with evaluation and treatment provided.  

## 2013-12-10 NOTE — Evaluation (Signed)
Occupational Therapy Assessment and Plan  Patient Details  Name: Steven [name] MRN: [id number] Date of Birth: 1960/08/06  OT Diagnosis: acute pain and muscle weakness (generalized) Rehab Potential: Rehab Potential: Good ELOS: 5-7 days   Today's Date: 12/10/2013 Time: 1100-1155 Time Calculation (min): 55 min  Problem List:  Patient Active Problem List   Diagnosis Date Noted  . Unilateral complete BKA 12/10/2013  . Osteomyelitis with abscess of left 1st and 2nd toes--s/p transmet amputation. 12/10/2013  . Cocaine abuse 12/05/2013  . Tobacco abuse 12/04/2013  . History of cocaine use   . Peripheral vascular disease   . Atrial fibrillation with RVR 12/03/2013  . Severe sepsis 12/03/2013  . Osteomyelitis of ankle or foot, right, acute 12/03/2013  . Osteomyelitis of ankle or foot, acute 11/21/2013  . Gangrene of foot 11/21/2013  . 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 Personal history of colonic adenomas 05/29/2013  . Diabetic retinopathy 12/25/2012  . HLD (hyperlipidemia) 12/08/2012  . Osteomyelitis of foot 08/30/2012  . Peripheral neuropathy 11/19/2011  . ETOH abuse 03/29/2011  . HTN (hypertension) 12/22/2010  . ONYCHOMYCOSIS, TOENAILS 07/18/2006  . Diabetes mellitus type II, uncontrolled 07/18/2006  . VENOUS INSUFFICIENCY 07/18/2006  . CALLUSES, FEET, BILATERAL 07/18/2006    Past Medical History:  Past Medical History  Diagnosis Date  . Uncontrolled diabetes mellitus   . HTN (hypertension)   . History of DVT (deep vein thrombosis)   . Personal history of diabetic foot ulcer   . Peripheral vascular disease     a. Abnl ABIs 2014.  Marland Kitchen Peripheral neuropathy 11/19/2011  . Varicose vein     legs  . Osteomyelitis of foot, right, acute 08/30/2012  . Personal history of colonic adenomas 05/29/2013  . History of cocaine use   . PAF (paroxysmal atrial fibrillation)     a. Dx 11/2013 in setting of sepsis/foot infection.  . Tobacco abuse   . H/O ETOH abuse    Past Surgical  History:  Past Surgical History  Procedure Laterality Date  . Amputation  04/21/2011    Procedure: AMPUTATION RAY;  Surgeon: Newt Minion, MD;  Location: [place];  Service: Orthopedics;  Laterality: Right;  Rt foot 2nd ray ampt  . I&d extremity  05/03/2011    Procedure: IRRIGATION AND DEBRIDEMENT EXTREMITY;  Surgeon: Newt Minion, MD;  Location: [place];  Service: Orthopedics;  Laterality: Right;  Irrigation and Debridement Right Foot and Place Acell Xenograft  . Toe amputation  04/24/2012    great toe   right foot  . Amputation  04/24/2012    Procedure: AMPUTATION RAY;  Surgeon: Newt Minion, MD;  Location: [place];  Service: Orthopedics;  Laterality: Right;  Right foot 1st ray amputation  . Amputation Left 04/23/2013    Procedure: AMPUTATION RAY;  Surgeon: Newt Minion, MD;  Location: [place];  Service: Orthopedics;  Laterality: Left;  Left Foot 5th Ray Amputation  . Colonoscopy    . Amputation Left 11/21/2013    Procedure: AMPUTATION RAY;  Surgeon: Newt Minion, MD;  Location: [place];  Service: Orthopedics;  Laterality: Left;  Left Foot 1st & 2nd Ray Amputation  . Amputation Right 12/04/2013    Procedure: AMPUTATION BELOW KNEE;  Surgeon: Marianna Payment, MD;  Location: [place];  Service: Orthopedics;  Laterality: Right;    Assessment & Plan Clinical Impression: Patient is a 53 y.o. year old male with h/o DM type 2--poorly controlled with peripheral neuropathy, HTN, multiple bilateral diabetic foot ulcers with osteomyelitis and  infection who had refused BKA in the past but was admitted on 12/03/13 with severe sepsis due to worsening of right foot ulcers with purulent drainage as well as gas gangrene. He was noted to have A fib with RVR and UDS + cocaine. No BB in setting of cocaine and long term anticoagulation recommended by Dr. Stanford Breed. 2D echo with EF 55-65% and no wall abnormality noted. 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  Cardiology recommends ischemic work up once he recovers from surgery. PT evaluation done and patient with heavy reliance on UE. Patient with left foot wound with drainage and Dr. Jess Barters office contacted for input. Sutures to stay in place for a week and silvadene/nitro patches recommended for wound care. He lives alone and has medicaid-  Patient transferred to [place] on 12/09/2013 .    Patient currently requires min to mod  with basic self-care skills and basic mobility secondary to muscle weakness, decreased cardiorespiratoy endurance and decreased standing balance and decreased balance strategies.  Prior to hospitalization, patient could complete ADLs with modified independent .  Patient will benefit from skilled intervention to decrease level of assist with basic self-care skills and increase independence with basic self-care skills prior to discharge home with care partner.  Anticipate patient will require intermittent supervision and f/u to be determined..  OT - End of Session Endurance Deficit: Yes OT Assessment Rehab Potential: Good OT Patient demonstrates impairments in the following area(s): Balance;Edema;Endurance;Motor;Pain;Skin Integrity OT Basic ADL's Functional Problem(s): Bathing;Dressing;Toileting OT Transfers Functional Problem(s): Toilet;Tub/Shower OT Additional Impairment(s): None OT Plan OT Intensity: Minimum of 1-2 x/day, 45 to 90 minutes OT Frequency: 5 out of 7 days OT Duration/Estimated Length of Stay: 5-7 days OT Treatment/Interventions: Balance/vestibular training;Community reintegration;Discharge planning;Disease mangement/prevention;Pain management;UE/LE Coordination activities;Skin care/wound managment;Self Care/advanced ADL retraining;UE/LE Strength taining/ROM;Functional mobility training;Psychosocial support;Therapeutic Exercise;DME/adaptive equipment instruction;Patient/family education;Therapeutic Activities;Wheelchair propulsion/positioning OT Self Feeding  Anticipated Outcome(s): no goal set OT Basic Self-Care Anticipated Outcome(s): mod I  OT Toileting Anticipated Outcome(s): mod I  OT Bathroom Transfers Anticipated Outcome(s): mod I  OT Recommendation Patient destination: Home Follow Up Recommendations: Other (comment) (TBD) Equipment Recommended: Tub/shower bench   Skilled Therapeutic Intervention OT eval initiated with Ot goals, purpose and role discussed. Self care retraining at sink level with focus on sit to stand, standing balance with UE support (bilaterally and unilaterally), navigating around room with w/c, setup of w/c in prep for transfers, basic transfers w/c<>bed, bathroom setup and introduced possibly of using a tub bench for shower transfer, and d/c planning.   OT Evaluation Precautions/Restrictions  Precautions Precautions: Fall Restrictions Weight Bearing Restrictions: Yes RLE Weight Bearing: Non weight bearing LLE Weight Bearing: Weight bearing as tolerated General Chart Reviewed: Yes Family/Caregiver Present: No Vital Signs Therapy Vitals Temp: 99.2 F (37.3 C) Temp src: Oral Pulse Rate: 77 Resp: 18 BP: 116/66 mmHg Patient Position (if appropriate): Lying Oxygen Therapy SpO2: 98 % O2 Device: None (Room air) Pain Pain Assessment Pain Assessment: No/denies pain Pain Score: 10-Worst pain ever (pt received pain meds) Pain Type: Phantom pain Pain Location: Leg Pain Orientation: Right Pain Descriptors / Indicators: Throbbing Pain Onset: Other (Comment) (comes and goes) Patients Stated Pain Goal: 0 Pain Intervention(s): Medication (See eMAR);Other (Comment) (educated re: mirror therapy) Multiple Pain Sites: No Home Living/Prior [place] expects to be discharged to:: Unsure Living Arrangements: Alone Available Help at Discharge: Personal care attendant;Available PRN/intermittently Type of Home: Apartment Home Access: Level entry Home Layout: One level  Lives With:  Alone Prior Function Level of Independence: Needs assistance with homemaking;Requires assistive device for independence  Able to Take Stairs?: No Driving: No Vocation: On disability ADL   Vision/Perception  Vision- History Baseline Vision/History: Wears glasses Wears Glasses: At all times Patient Visual Report: No change from baseline  Cognition Overall Cognitive Status: Within Functional Limits for tasks assessed Arousal/Alertness: Awake/alert Orientation Level: Oriented X4 Sensation Sensation Light Touch: Impaired Detail Light Touch Impaired Details: Impaired RLE;Impaired LLE Hot/Cold: Appears Intact Proprioception: Appears Intact Coordination Gross Motor Movements are Fluid and Coordinated: Yes Fine Motor Movements are Fluid and Coordinated: Yes Motor  Motor Motor: Within Functional Limits Motor - Skilled Clinical Observations: decr standing balance Mobility  Transfers Transfers: Sit to Stand;Stand to Sit Sit to Stand: 4: Min assist Stand to Sit: 4: Min assist  Trunk/Postural Assessment  Cervical Assessment Cervical Assessment: Within Functional Limits Thoracic Assessment Thoracic Assessment: Within Functional Limits Lumbar Assessment Lumbar Assessment: Within Functional Limits Postural Control Postural Control: Within Functional Limits  Balance Static Sitting Balance Static Sitting - Balance Support: No upper extremity supported Static Sitting - Level of Assistance: 5: Stand by assistance Dynamic Sitting Balance Dynamic Sitting - Level of Assistance: 5: Stand by assistance Static Standing Balance Static Standing - Balance Support: Bilateral upper extremity supported Static Standing - Level of Assistance: 5: Stand by assistance Dynamic Standing Balance Dynamic Standing - Balance Support: Bilateral upper extremity supported Dynamic Standing - Level of Assistance: 4: Min assist;3: Mod assist Extremity/Trunk Assessment RUE Assessment RUE Assessment: Within  Functional Limits LUE Assessment LUE Assessment: Within Functional Limits  FIM:  FIM - Eating Eating Activity: 7: Complete independence:no helper FIM - Grooming Grooming Steps: Wash, rinse, dry face;Wash, rinse, dry hands;Oral care, brush teeth, clean dentures;Brush, comb hair;Shave or apply make-up Grooming: 6: More than reasonable amount of time FIM - Bathing Bathing Steps Patient Completed: Chest;Right Arm;Left Arm;Abdomen;Front perineal area;Right upper leg;Left upper leg Bathing: 4: Min-Patient completes 8-9 [id number] parts or 75+ percent FIM - Upper Body Dressing/Undressing Upper body dressing/undressing: 0: Wears gown/pajamas-no public clothing FIM - Lower Body Dressing/Undressing Lower body dressing/undressing steps patient completed: Thread/unthread right underwear leg;Thread/unthread left underwear leg Lower body dressing/undressing: 3: Mod-Patient completed 50-74% of tasks FIM - BControl and instrumentation engineerDevices: Walker;Arm rests Bed/Chair Transfer: 4: Bed > Chair or W/C: Min A (steadying Pt. > 75%)   Refer to Care Plan for Long Term Goals  Recommendations for other services: None  Discharge Criteria: Patient will be discharged from OT if patient refuses treatment 3 consecutive times without medical reason, if treatment goals not met, if there is a change in medical status, if patient makes no progress towards goals or if patient is discharged from hospital.  The above assessment, treatment plan, treatment alternatives and goals were discussed and mutually agreed upon: by patient  SNicoletta Ba7/06/2013, 1:29 PM

## 2013-12-10 NOTE — Progress Notes (Signed)
Physical Therapy Session Note  Patient Details  Name: Steven [name] MRN: [id number] Date of Birth: September 06, 1960  Today's Date: 12/10/2013 Time: 1355-1430 Time Calculation (min): 35 min  Skilled Therapeutic Interventions/Progress Updates:    Pt received on bedside commode. Therapist allowed pt time to finish toileting, then assisted with stand pivot transfer back to bed. Session focused on proper wheelchair fit, standing static balance, and pre-gait activities. Fit pt's wheelchair with pressure relieving cushion due to reported discomfort with basic cushion. Pt propelled w/c with BUE to gym with supervision/setup for leg rest management. Worked on sit/stands and standing balance in parallel bars, including pre-gait activity of unweighting intact limb in order to prepare for safe ambulation. Pt required frequent rest breaks due to fatigue and phantom pain in RLE. Recommend PRAFO for pt at night in order to preserve skin integrity in intact foot. Pt propelled w/c back to room w/ supervision. Pt w/ SPT back to bed w/ RW w/ MinA, supervision for sit to supine. Pt left with all needs within reach.  Therapy Documentation Precautions:  Precautions Precautions: Fall Required Braces or Orthoses:  (ACE wrap RLE) Restrictions Weight Bearing Restrictions: Yes RLE Weight Bearing: Non weight bearing LLE Weight Bearing: Weight bearing as tolerated General: Amount of Missed PT Time (min): 10 Minutes Missed Time Reason: Other (comment) (Toileting) Pain: Pain Assessment Pain Assessment: 0-10 Pain Score: 4  Pain Type: Phantom pain Pain Location: Leg Pain Orientation: Right Pain Descriptors / Indicators: Aching Pain Onset: With Activity Patients Stated Pain Goal: 0 Pain Intervention(s): Rest  See FIM for current functional status  Therapy/Group: Individual Therapy  Rada Hay Rada Hay, PT, DPT  12/10/2013, 4:07 PM

## 2013-12-10 NOTE — Progress Notes (Signed)
53 y.o. male with h/o DM type 2--poorly controlled with peripheral neuropathy, HTN, multiple bilateral diabetic foot ulcers with osteomyelitis and infection who had refused BKA in the past but was admitted on 12/03/13 with severe sepsis due to worsening of right foot ulcers with purulent drainage as well as gas gangrene. He was noted to have A fib with RVR and UDS + cocaine. No BB in setting of cocaine and long term anticoagulation recommended by Dr. Stanford Breed. 2D echo with EF 55-65% and no wall abnormality noted. 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 Cardiology recommends ischemic work up once he recovers from surgery. PT evaluation done and patient with heavy reliance on UE. Patient with left foot wound with drainage and Dr. Jess Barters office contacted for input. Sutures to stay in place for a week and silvadene/nitro patches   Subjective/Complaints: No severe pain , no BM since rehab admit  Objective: Vital Signs: Blood pressure 117/67, pulse 85, temperature 97.8 F (36.6 C), temperature source Oral, resp. rate 19, weight 97.251 kg (214 lb 6.4 oz), SpO2 99.00%. No results found. Results for orders placed during the hospital encounter of 12/09/13 (from the past 72 hour(s))  GLUCOSE, CAPILLARY     Status: Abnormal   Collection Time    12/09/13  6:22 PM      Result Value Ref Range   Glucose-Capillary 166 (*) 70 - 99 mg/dL   Comment 1 Documented in Chart    GLUCOSE, CAPILLARY     Status: Abnormal   Collection Time    12/09/13  9:14 PM      Result Value Ref Range   Glucose-Capillary 239 (*) 70 - 99 mg/dL      Nursing note and vitals reviewed.  Constitutional: He is oriented to person, place, and time. He appears well-developed and well-nourished.  HENT:  Head: Normocephalic and atraumatic.  Eyes: Conjunctivae are normal. Pupils are equal, round, and reactive to light.  Neck: Normal range of motion. Neck supple.   Cardiovascular: Normal rate and regular rhythm.  No murmur heard.  Respiratory: Effort normal and breath sounds normal. No respiratory distress. He has no wheezes.  GI: Soft. Bowel sounds are normal. He exhibits no distension. There is no tenderness.  Musculoskeletal:  R-BKA with coban compressive dressing. Left trans met amputation with sutures in place and ischemic edges with 3 open areas . 2+ edema left foot.  Neurological: He is alert and oriented to person, place, and time.  Skin: Skin is warm and dry.  Motor: 5/5 bilateral deltoid, bicep, tricep, grip  4/5 left hip flexor knee extensor 4/5 right hip flexor, trace left ankle dorsiflexor plantar flexor 0 toe flexion-extension   Assessment/Plan: 1. 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 Physiatrist is providing close team supervision and 24 hour management of active medical problems listed below. Physiatrist and rehab team continue to assess barriers to discharge/monitor patient progress toward functional and medical goals. FIM:                   Comprehension Comprehension Mode: Auditory;Visual Comprehension: 5-Follows basic conversation/direction: With no assist  Expression Expression Mode: Verbal Expression: 5-Expresses basic needs/ideas: With no assist  Social Interaction Social Interaction: 7-Interacts appropriately with others - No medications needed.  Problem Solving Problem Solving: 5-Solves basic problems: With no assist  Memory Memory: 6-More than reasonable amt of time  Problem List and Plan:  1. Functional deficits secondary to R-BKA and recent left transmet amputation.  2. DVT Prophylaxis/Anticoagulation: Pharmaceutical: Xarelto  3. Pain Management: Continue neurontin for neuropathy  4. Mood: Will have LCSW follow for evaluation and support.  5. Neuropsych: This patient  is capable of making decisions on his own behalf.  6. DM type 2: Will monitor with ac/hs checks. Continue lantus 30 units at bedtime with SSI for elevated BS. Titrate lantus for better control. Educate patient on importance of medication compliance.  7. A fib: Will monitor HR every 8 hours. Continue Cardizem and Xarelto.  8. HTN: Monitor BP every 8 hours. Continue HCTZ, lisinopril and Cardizem.  9. ABLA: Will add iron supplement. Recheck in am.  10. Hyponatremia: Due to sepsis resolving. Will recheck in am.  11. Left transmet amputation: Shows signs of ischemia. Will ask surgeon to evaluate prior to suture removal and resume doxycyline. Will add silvadene and NTG patch per recommendations. Will order [facility] sandal   LOS (Days) 1 A FACE TO FACE EVALUATION WAS PERFORMED  KIRSTEINS,ANDREW E 12/10/2013, 6:34 AM

## 2013-12-10 NOTE — Progress Notes (Signed)
Orthopedic Tech Progress Note Patient Details:  Steven [name] 02/18/1961 [id number]  [facility] contacted for the brace  Patient ID: Steven [name], male   DOB: September 16, 1960, 53 y.o.   MRN: [id number]   Steven [name] 12/10/2013, 1:52 PM

## 2013-12-10 NOTE — Progress Notes (Signed)
Physical Therapy Session Note  Patient Details  Name: Steven [name] MRN: [id number] Date of Birth: 31-Mar-1961  Today's Date: 12/10/2013 Time: 1030-1100 Time Calculation (min): 30 min   Skilled Therapeutic Interventions/Progress Updates:  Therapeutic exercise:15 minutes PT instructs pt in L LE strengthening exercises: 5# ankle weight to L LE: 2 x 20 reps LAQ and seated march PT instructs pt in R LE ROM exercise: R residual limb: knee extension stretch 2 x 60 seconds  W/C Management and Propulsion: 15 minutes PT instructs pt in w/c propulsion with B UE req Supervision/cues for technique x 200' - focus is on efficiency with turns.   Therapy Documentation Precautions:  Precautions Precautions: Fall Required Braces or Orthoses:  (ACE wrap RLE) Restrictions Weight Bearing Restrictions: Yes RLE Weight Bearing: Non weight bearing LLE Weight Bearing: Weight bearing as tolerated Vital Signs: Therapy Vitals Pulse Rate: 87 BP: 105/60 mmHg Patient Position (if appropriate): Sitting Oxygen Therapy SpO2: 97 % O2 Device: None (Room air) Pain: Pain Assessment Pain Assessment: 0-10 Pain Score: 10-Worst pain ever (pt received pain meds) Pain Type: Phantom pain Pain Location: Leg Pain Orientation: Right Pain Descriptors / Indicators: Throbbing Pain Onset: Other (Comment) (comes and goes) Patients Stated Pain Goal: 0 Pain Intervention(s): Medication (See eMAR);Other (Comment) (educated re: mirror therapy) Multiple Pain Sites: No   See FIM for current functional status  Therapy/Group: Individual Therapy  Lindy Pennisi M 12/10/2013, 11:00 AM

## 2013-12-10 NOTE — Progress Notes (Signed)
Inpatient Diabetes Program Recommendations  AACE/ADA: New Consensus Statement on Inpatient Glycemic Control (2013)  Target Ranges:  Prepandial:   less than 140 mg/dL      Peak postprandial:   less than 180 mg/dL (1-2 hours)      Critically ill patients:  140 - 180 mg/dL      Results for MONTE, ZINNI (MRN [id number]) as of 12/10/2013 08:10  Ref. Range 12/09/2013 07:49 12/09/2013 11:37 12/09/2013 16:41 12/09/2013 18:22 12/09/2013 21:14  Glucose-Capillary Latest Range: 70-99 mg/dL 104 (H) 171 (H) 216 (H) 166 (H) 239 (H)    Results for RAYMAR, JOINER (MRN [id number]) as of 12/10/2013 08:10  Ref. Range 12/10/2013 07:20  Glucose-Capillary Latest Range: 70-99 mg/dL 169 (H)     **Transferred to Inpatient Rehab unit.  Patient was seen by the DM Coordinator on 06/26.  Had lengthy conversation with patient about his A1c and the importance of good CBG control.  **Patient having issues with postprandial glucose elevations    MD- Please consider adding Novolog Meal Coverage- Novolog 3 units tid with meals    Will follow Wyn Quaker RN, MSN, CDE Diabetes Coordinator Inpatient Diabetes Program Team Pager: [phone] (8a-10p)'

## 2013-12-11 ENCOUNTER — Inpatient Hospital Stay (HOSPITAL_COMMUNITY): Payer: Medicaid Other

## 2013-12-11 DIAGNOSIS — S88119A Complete traumatic amputation at level between knee and ankle, unspecified lower leg, initial encounter: Secondary | ICD-10-CM

## 2013-12-11 DIAGNOSIS — L98499 Non-pressure chronic ulcer of skin of other sites with unspecified severity: Secondary | ICD-10-CM

## 2013-12-11 DIAGNOSIS — I739 Peripheral vascular disease, unspecified: Secondary | ICD-10-CM

## 2013-12-11 LAB — COMPREHENSIVE METABOLIC PANEL
ALT: 50 U/L (ref 0–53)
AST: 41 U/L — ABNORMAL HIGH (ref 0–37)
Albumin: 1.9 g/dL — ABNORMAL LOW (ref 3.5–5.2)
Alkaline Phosphatase: 107 U/L (ref 39–117)
Anion gap: 13 (ref 5–15)
BUN: 7 mg/dL (ref 6–23)
CO2: 27 meq/L (ref 19–32)
Calcium: 9.4 mg/dL (ref 8.4–10.5)
Chloride: 94 mEq/L — ABNORMAL LOW (ref 96–112)
Creatinine, Ser: 0.7 mg/dL (ref 0.50–1.35)
GFR calc Af Amer: >90 mL/min (ref >90)
GFR calc non Af Amer: >90 mL/min (ref >90)
Glucose, Bld: 142 mg/dL — ABNORMAL HIGH (ref 70–99)
Potassium: 4.2 mEq/L (ref 3.7–5.3)
SODIUM: 134 meq/L — AB (ref 137–147)
Total Bilirubin: 0.3 mg/dL (ref 0.3–1.2)
Total Protein: 7.6 g/dL (ref 6.0–8.3)

## 2013-12-11 LAB — CBC WITH DIFFERENTIAL/PLATELET
Basophils Absolute: 0.1 10*3/uL (ref 0.0–0.1)
Basophils Relative: 1 % (ref 0–1)
Eosinophils Absolute: 0.3 10*3/uL (ref 0.0–0.7)
Eosinophils Relative: 4 % (ref 0–5)
HCT: 27.6 % — ABNORMAL LOW (ref 39.0–52.0)
HEMOGLOBIN: 9.1 g/dL — AB (ref 13.0–17.0)
LYMPHS ABS: 1.8 10*3/uL (ref 0.7–4.0)
LYMPHS PCT: 25 % (ref 12–46)
MCH: 28.4 pg (ref 26.0–34.0)
MCHC: 33 g/dL (ref 30.0–36.0)
MCV: 86.3 fL (ref 78.0–100.0)
Monocytes Absolute: 1 10*3/uL (ref 0.1–1.0)
Monocytes Relative: 13 % — ABNORMAL HIGH (ref 3–12)
Neutro Abs: 4.2 10*3/uL (ref 1.7–7.7)
Neutrophils Relative %: 57 % (ref 43–77)
PLATELETS: 396 10*3/uL (ref 150–400)
RBC: 3.2 MIL/uL — AB (ref 4.22–5.81)
RDW: 14.7 % (ref 11.5–15.5)
WBC: 7.2 10*3/uL (ref 4.0–10.5)

## 2013-12-11 LAB — GLUCOSE, CAPILLARY
GLUCOSE-CAPILLARY: 182 mg/dL — AB (ref 70–99)
Glucose-Capillary: 141 mg/dL — ABNORMAL HIGH (ref 70–99)
Glucose-Capillary: 177 mg/dL — ABNORMAL HIGH (ref 70–99)
Glucose-Capillary: 290 mg/dL — ABNORMAL HIGH (ref 70–99)

## 2013-12-11 MED ORDER — HYDROCHLOROTHIAZIDE 12.5 MG PO CAPS
12.5000 mg | ORAL_CAPSULE | Freq: Every day | ORAL | Status: DC
  Filled 2013-12-11 (×2): qty 1

## 2013-12-11 NOTE — Progress Notes (Signed)
Patient blood pressure manually 89/69, HR 89, patient is asymptomatic, checked with automatic reader readings  Left arm 88/55 hr 89, right arm 98/66 hr 89. Made PA aware.

## 2013-12-11 NOTE — Progress Notes (Signed)
53 y.o. male with h/o DM type 2--poorly controlled with peripheral neuropathy, HTN, multiple bilateral diabetic foot ulcers with osteomyelitis and infection who had refused BKA in the past but was admitted on 12/03/13 with severe sepsis due to worsening of right foot ulcers with purulent drainage as well as gas gangrene. He was noted to have A fib with RVR and UDS + cocaine. No BB in setting of cocaine and long term anticoagulation recommended by Dr. Stanford Breed. 2D echo with EF 55-65% and no wall abnormality noted. 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 Cardiology recommends ischemic work up once he recovers from surgery. PT evaluation done and patient with heavy reliance on UE. Patient with left foot wound with drainage and Dr. Jess Barters office contacted for input. Sutures to stay in place for a week and silvadene/nitro patches   Subjective/Complaints: No severe pain , no BM since rehab admit Review of Systems - Negative except fatigue after PT yest  Objective: Vital Signs: Blood pressure 109/64, pulse 75, temperature 97.8 F (36.6 C), temperature source Oral, resp. rate 19, weight 101.969 kg (224 lb 12.8 oz), SpO2 94.00%. No results found. Results for orders placed during the hospital encounter of 12/09/13 (from the past 72 hour(s))  GLUCOSE, CAPILLARY     Status: Abnormal   Collection Time    12/09/13  6:22 PM      Result Value Ref Range   Glucose-Capillary 166 (*) 70 - 99 mg/dL   Comment 1 Documented in Chart    GLUCOSE, CAPILLARY     Status: Abnormal   Collection Time    12/09/13  9:14 PM      Result Value Ref Range   Glucose-Capillary 239 (*) 70 - 99 mg/dL  GLUCOSE, CAPILLARY     Status: Abnormal   Collection Time    12/10/13  7:20 AM      Result Value Ref Range   Glucose-Capillary 169 (*) 70 - 99 mg/dL  GLUCOSE, CAPILLARY     Status: Abnormal   Collection Time    12/10/13 12:12 PM      Result Value Ref Range   Glucose-Capillary 147 (*) 70 - 99 mg/dL  GLUCOSE, CAPILLARY     Status: Abnormal   Collection Time    12/10/13  4:37 PM      Result Value Ref Range   Glucose-Capillary 238 (*) 70 - 99 mg/dL   Comment 1 Notify RN    GLUCOSE, CAPILLARY     Status: Abnormal   Collection Time    12/10/13  9:45 PM      Result Value Ref Range   Glucose-Capillary 168 (*) 70 - 99 mg/dL  CBC WITH DIFFERENTIAL     Status: Abnormal   Collection Time    12/11/13  4:37 AM      Result Value Ref Range   WBC 7.2  4.0 - 10.5 K/uL   RBC 3.20 (*) 4.22 - 5.81 MIL/uL   Hemoglobin 9.1 (*) 13.0 - 17.0 g/dL   HCT 27.6 (*) 39.0 - 52.0 %   MCV 86.3  78.0 - 100.0 fL   MCH 28.4  26.0 - 34.0 pg   MCHC 33.0  30.0 - 36.0 g/dL   RDW 14.7  11.5 - 15.5 %   Platelets 396  150 - 400 K/uL   Neutrophils Relative % 57  43 - 77 %   Neutro Abs 4.2  1.7 - 7.7 K/uL   Lymphocytes Relative 25  12 - 46 %   Lymphs Abs 1.8  0.7 - 4.0 K/uL   Monocytes Relative 13 (*) 3 - 12 %   Monocytes Absolute 1.0  0.1 - 1.0 K/uL   Eosinophils Relative 4  0 - 5 %   Eosinophils Absolute 0.3  0.0 - 0.7 K/uL   Basophils Relative 1  0 - 1 %   Basophils Absolute 0.1  0.0 - 0.1 K/uL  COMPREHENSIVE METABOLIC PANEL     Status: Abnormal   Collection Time    12/11/13  4:37 AM      Result Value Ref Range   Sodium 134 (*) 137 - 147 mEq/L   Potassium 4.2  3.7 - 5.3 mEq/L   Chloride 94 (*) 96 - 112 mEq/L   CO2 27  19 - 32 mEq/L   Glucose, Bld 142 (*) 70 - 99 mg/dL   BUN 7  6 - 23 mg/dL   Creatinine, Ser 0.70  0.50 - 1.35 mg/dL   Calcium 9.4  8.4 - 10.5 mg/dL   Total Protein 7.6  6.0 - 8.3 g/dL   Albumin 1.9 (*) 3.5 - 5.2 g/dL   AST 41 (*) 0 - 37 U/L   ALT 50  0 - 53 U/L   Alkaline Phosphatase 107  39 - 117 U/L   Total Bilirubin 0.3  0.3 - 1.2 mg/dL   GFR calc non Af Amer >90  >90 mL/min   GFR calc Af Amer >90  >90 mL/min   Comment: (NOTE)     The eGFR has been calculated using the CKD EPI equation.     This calculation has not been validated in all  clinical situations.     eGFR's persistently <90 mL/min signify possible Chronic Kidney     Disease.   Anion gap 13  5 - 15      Nursing note and vitals reviewed.  Constitutional: He is oriented to person, place, and time. He appears well-developed and well-nourished.  HENT:  Head: Normocephalic and atraumatic.  Eyes: Conjunctivae are normal. Pupils are equal, round, and reactive to light.  Neck: Normal range of motion. Neck supple.  Cardiovascular: Normal rate and regular rhythm.  No murmur heard.  Respiratory: Effort normal and breath sounds normal. No respiratory distress. He has no wheezes.  GI: Soft. Bowel sounds are normal. He exhibits no distension. There is no tenderness.  Musculoskeletal:  R-BKA with coban compressive dressing. Neurological: He is alert and oriented to person, place, and time.  Skin: Skin is warm and dry.  Motor: 5/5 bilateral deltoid, bicep, tricep, grip  4/5 left hip flexor knee extensor 4/5 right hip flexor, trace left ankle dorsiflexor plantar flexor 0 toe flexion-extension   Assessment/Plan: 1. 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 Physiatrist is providing close team supervision and 24 hour management of active medical problems listed below. Physiatrist and rehab team continue to assess barriers to discharge/monitor patient progress toward functional and medical goals. FIM: FIM - Bathing Bathing Steps Patient Completed: Chest;Right Arm;Left Arm;Abdomen;Front perineal area;Right upper leg;Left upper leg Bathing: 4: Min-Patient completes 8-9 4f10 parts or 75+ percent  FIM - Upper Body Dressing/Undressing Upper body dressing/undressing: 0: Wears gown/pajamas-no public clothing FIM - Lower Body Dressing/Undressing Lower body dressing/undressing steps patient completed: Thread/unthread right underwear leg;Thread/unthread  left underwear leg Lower body dressing/undressing: 3: Mod-Patient completed 50-74% of tasks  FIM - Toileting Toileting: 6:  More than reasonable amount of time     FIM - Control and instrumentation engineer Devices: Walker;Arm rests Bed/Chair Transfer: 4: Bed > Chair or W/C: Min A (steadying Pt. > 75%)  FIM - Locomotion: Wheelchair Locomotion: Wheelchair: 5: Travels 150 ft or more: maneuvers on rugs and over door sills with supervision, cueing or coaxing FIM - Locomotion: Ambulation Locomotion: Ambulation Assistive Devices: Administrator Ambulation/Gait Assistance: 4: Min assist Locomotion: Ambulation: 1: Travels less than 50 ft with minimal assistance (Pt.>75%)  Comprehension Comprehension Mode: Auditory Comprehension: 5-Follows basic conversation/direction: With no assist  Expression Expression Mode: Verbal Expression: 5-Expresses basic needs/ideas: With no assist  Social Interaction Social Interaction: 7-Interacts appropriately with others - No medications needed.  Problem Solving Problem Solving: 6-Solves complex problems: With extra time  Memory Memory: 4-Recognizes or recalls 75 - 89% of the time/requires cueing 10 - 24% of the time  Problem List and Plan:  1. Functional deficits secondary to R-BKA and recent left transmet amputation.  2. DVT Prophylaxis/Anticoagulation: Pharmaceutical: Xarelto  3. Pain Management: Continue neurontin for neuropathy  4. Mood: Will have LCSW follow for evaluation and support.  5. Neuropsych: This patient is capable of making decisions on his own behalf.  6. DM type 2: Will monitor with ac/hs checks. Continue lantus 30 units at bedtime with SSI for elevated BS. Titrate lantus for better control. Educate patient on importance of medication compliance.  7. A fib: Will monitor HR every 8 hours. Continue Cardizem and Xarelto.  8. HTN: Monitor BP every 8 hours. Continue HCTZ, lisinopril and Cardizem.  9. ABLA: Will add iron  supplement. Recheck in am.  10. Hyponatremia: Due to sepsis resolving. Will recheck in am.  11. Left transmet amputation: Shows signs of ischemia. Will ask surgeon to evaluate prior to suture removal and resume doxycyline. Will add silvadene and NTG patch per recommendations. Will order [facility] sandal   LOS (Days) 2 A FACE TO FACE EVALUATION WAS PERFORMED  KIRSTEINS,ANDREW E 12/11/2013, 7:15 AM

## 2013-12-11 NOTE — Care Management Note (Signed)
[facility] Individual Statement of Services  Patient Name:  Steven [name]  Date:  12/11/2013  Welcome to the [facility].  Our goal is to provide you with an individualized program based on your diagnosis and situation, designed to meet your specific needs.  With this comprehensive rehabilitation program, you will be expected to participate in at least 3 hours of rehabilitation therapies Monday-Friday, with modified therapy programming on the weekends.  Your rehabilitation program will include the following services:  Physical Therapy (PT), Occupational Therapy (OT), 24 hour per day rehabilitation nursing, Therapeutic Recreaction (TR), Case Management (Social Worker), Rehabilitation Medicine, Nutrition Services and Pharmacy Services  Weekly team conferences will be held on Tuesdays to discuss your progress.  Your Social Worker will talk with you frequently to get your input and to update you on team discussions.  Team conferences with you and your family in attendance may also be held.  Expected length of stay: 5-7 days  Overall anticipated outcome: modified independent  Depending on your progress and recovery, your program may change. Your Social Worker will coordinate services and will keep you informed of any changes. Your Social Worker's name and contact numbers are listed  below.  The following services may also be recommended but are not provided by the [facility] will be made to provide these services after discharge if needed.  Arrangements include referral to agencies that provide these services.  Your insurance has been verified to be:  Medicaid Your primary doctor is:  Dr. Chrisandra Netters ([place])  Pertinent information will be shared with your doctor and your insurance  company.  Social Worker:  [place], [place] or ([phone]   Information discussed with and copy given to patient by: Lennart Pall, 12/11/2013, 10:13 AM

## 2013-12-11 NOTE — Progress Notes (Signed)
Occupational Therapy Session Note  Patient Details  Name: Steven [name] MRN: [id number] Date of Birth: 11-29-1960  Today's Date: 12/11/2013 Time: 3151-7616 Time Calculation (min): 62 min  Short Term Goals: Week 1:  OT Short Term Goal 1 (Week 1): Short term goals = LTG due to short LOS  Skilled Therapeutic Interventions/Progress Updates: ADL-retraining at shower level with focus on sit<>stand, tranfers, endurance, and safety awareness.   Patient received supine in bed awaiting therapist.   With remotivation provided, patient agreed to attempt shower transfer and bathe in shower provided wounds were adequately covered.  Patient completed bed mobility using bed rail, HOB elevated and transferred to w/c with steadying assist.  Patient escorted to shower in w/c, completed similar transfer to shower chair and performed bathing, seated in shower chair, using lateral leans to wash buttocks, unassisted.  Patient returned to w/c and performed dressing at sink side with min assist to pull up pants as he performed partial stand/squat.   Patient left in w/c near edge of bed, dry shaving, as phyiscal therapist entered room.    Therapy Documentation Precautions:  Precautions Precautions: Fall Required Braces or Orthoses:  (ACE wrap RLE) Restrictions Weight Bearing Restrictions: Yes RLE Weight Bearing: Non weight bearing LLE Weight Bearing: Weight bearing as tolerated  Vital Signs: Therapy Vitals Pulse Rate: 90 BP: 117/77 mmHg Patient Position (if appropriate): Sitting  Pain: Pain Assessment Pain Assessment: 0-10 Pain Score: 7  Pain Type: Neuropathic pain Pain Location: Leg Pain Orientation: Right Pain Descriptors / Indicators: Throbbing Pain Onset: Other (Comment) (intermitent and with activity) Patients Stated Pain Goal: 7 Pain Intervention(s): RN made aware;Repositioned;Shower Multiple Pain Sites: No  See FIM for current functional status  Therapy/Group: Individual Therapy  Second  session: Time: 0737-1062 Time Calculation (min):  45 min  Pain Assessment: No/denies pain  Skilled Therapeutic Interventions: Therapeutic exercises with focus on general endurance, UE strengthening using Sci-Fit.   Patient educated on benefit of therex to improve BP and increase endurance and UE strength.   Patient completed 5 min warm-up followed by HR/BP assessment (94/61), resumed exercise routine and terminated 2:30 m short of goal of 20 min sustained exercise d/t fatigue.   HR/BP re-assessed (108/74).   Patient rated exertion as 14 on BORG scale and recovered to bed with steadying assist to complete transfer from w/c to bed.  See FIM for current functional status  Therapy/Group: Individual Therapy  Ariyel Jeangilles 12/11/2013, 10:51 AM

## 2013-12-11 NOTE — Progress Notes (Signed)
Physical Therapy Session Note  Patient Details  Name: Steven [name] MRN: [id number] Date of Birth: [birth year]  Today's Date: 12/11/2013 Session 1: Time: 3474-2595 Time Calculation (min): 55 min Session 2: Time: 1305-1335, 30 min  Short Term Goals: Week 1:     Skilled Therapeutic Interventions/Progress Updates:    Session 1: Pt received in wheelchair, agreeable to participate in therapy. Pt reported that OT had noticed "red spot" on buttocks, RN made aware and pt received nursing care/medications prior to beginning therapy. With supervision/setup pt able to propel w/c 100'. Pt then ambulated 20'x3 w/ RW and MinA w/ [employer] shoe on L foot. Pt significantly fatigued after each bout, requiring extended rest break. Pt w/ SPT w/ RW and MinA to mat table, then Min/CGA for sit to supine. Pt performed x10 RLE amputee exercises (quad sets, hip abd, SLR, SAQ). Pt performed SPT w/ RW and MinA to w/c and to recliner. Pt left in recliner w/ all needs within reach.  Session 2: Pt received in recliner w/ sister present finishing lunch. Pt agreeable to participate in therapy. Pt donned [employer] shoe w/ ModA , then performed SPT to w/c w/ ModA. Session focused on functional household mobility, upright tolerance, and ambulation. Pt propelled w/c 75' to practice apartment w/ supervision, was able to setup w/c for bed transfer w/ MinA and cues for chair placement. Worked on squat pivot transfers bed <> w/c w/ MinA progressing to supervision. Pt able to demonstrate understanding of steps for bed transfer, would benefit from additional reinforcement. Pt able to manage w/c parts w/ supervision and occasional MinA. Pt ambulated 20' w/ RW and MinA for safety, limited by fatigue and "throbbing" pain in L foot. Pt propelled w/c 75' back to room w/ supervision, left in care of RN.  Therapy Documentation Precautions:  Precautions Precautions: Fall Required Braces or Orthoses:  (ACE wrap RLE) Restrictions Weight Bearing  Restrictions: Yes RLE Weight Bearing: Non weight bearing LLE Weight Bearing: Weight bearing as tolerated General: Amount of Missed PT Time (min): 5 Minutes Missed Time Reason: Nursing care Vital Signs:   Pain: Pain Assessment Pain Assessment: 0-10 Pain Score: 8  Pain Type: Phantom pain Pain Location: Leg Pain Orientation: Right Pain Descriptors / Indicators: Aching Pain Onset: Other (Comment) (intermitent and with activity) Patients Stated Pain Goal: 0 Pain Intervention(s): RN made aware Multiple Pain Sites: No Locomotion : Ambulation Ambulation/Gait Assistance: 4: Min assist   See FIM for current functional status  Therapy/Group: Individual Therapy  Rada Hay Rada Hay, PT, DPT  12/11/2013, 11:58 AM

## 2013-12-11 NOTE — Progress Notes (Signed)
Subjective:     Patient reports pain as mild. Called to see pt about continued bloody drainage with dressing change silvadene BID and incision edge necrosis after medial 3 ray amputation.   Objective: Vital signs in last 24 hours: Temp:  [97.8 F (36.6 C)-99.2 F (37.3 C)] 97.8 F (36.6 C) (07/02 0511) Pulse Rate:  [75-90] 90 (07/02 0741) Resp:  [17-19] 19 (07/02 0511) BP: (105-133)/(60-79) 117/77 mmHg (07/02 0741) SpO2:  [94 %-99 %] 94 % (07/02 0511) Weight:  [101.969 kg (224 lb 12.8 oz)] 101.969 kg (224 lb 12.8 oz) (07/01 0900)  Intake/Output from previous day: 07/01 0701 - 07/02 0700 In: 720 [P.O.:720] Out: 2050 [Urine:2050] Intake/Output this shift:     Recent Labs  12/09/13 0555 12/11/13 0437  HGB 9.9* 9.1*    Recent Labs  12/09/13 0555 12/11/13 0437  WBC 8.0 7.2  RBC 3.46* 3.20*  HCT 29.7* 27.6*  PLT 409* 396    Recent Labs  12/09/13 0555 12/11/13 0437  NA 134* 134*  K 4.0 4.2  CL 96 94*  CO2 25 27  BUN 5* 7  CREATININE 0.67 0.70  GLUCOSE 99 142*  CALCIUM 9.2 9.4   No results found for this basename: LABPT, INR,  in the last 72 hours  incisional edge necrosis, sutures intact , bloody drainage no purulence.  expect slow healing with PAD, diabetes.    Assessment/Plan:     Plan:   Discussed with patient that this is expected to heal slowly and with PAD and DM he may have late wound breakdown and may need more proximal amputation. Continue dressing changes. No evidence of infection at this time.     Myrian Botello C 12/11/2013, 7:52 AM

## 2013-12-12 ENCOUNTER — Inpatient Hospital Stay (HOSPITAL_COMMUNITY): Payer: Medicaid Other

## 2013-12-12 LAB — CBC WITH DIFFERENTIAL/PLATELET
Basophils Absolute: 0 10*3/uL (ref 0.0–0.1)
Basophils Relative: 0 % (ref 0–1)
EOS PCT: 3 % (ref 0–5)
Eosinophils Absolute: 0.3 10*3/uL (ref 0.0–0.7)
HEMATOCRIT: 30 % — AB (ref 39.0–52.0)
HEMOGLOBIN: 9.7 g/dL — AB (ref 13.0–17.0)
LYMPHS ABS: 2.1 10*3/uL (ref 0.7–4.0)
LYMPHS PCT: 21 % (ref 12–46)
MCH: 28.4 pg (ref 26.0–34.0)
MCHC: 32.3 g/dL (ref 30.0–36.0)
MCV: 88 fL (ref 78.0–100.0)
MONO ABS: 0.9 10*3/uL (ref 0.1–1.0)
MONOS PCT: 9 % (ref 3–12)
NEUTROS ABS: 7 10*3/uL (ref 1.7–7.7)
Neutrophils Relative %: 67 % (ref 43–77)
Platelets: 401 10*3/uL — ABNORMAL HIGH (ref 150–400)
RBC: 3.41 MIL/uL — AB (ref 4.22–5.81)
RDW: 14.7 % (ref 11.5–15.5)
WBC: 10.4 10*3/uL (ref 4.0–10.5)

## 2013-12-12 LAB — URINALYSIS, ROUTINE W REFLEX MICROSCOPIC
BILIRUBIN URINE: NEGATIVE
Glucose, UA: 100 mg/dL — AB
Hgb urine dipstick: NEGATIVE
KETONES UR: NEGATIVE mg/dL
Leukocytes, UA: NEGATIVE
NITRITE: NEGATIVE
Protein, ur: NEGATIVE mg/dL
Specific Gravity, Urine: 1.014 (ref 1.005–1.030)
UROBILINOGEN UA: 0.2 mg/dL (ref 0.0–1.0)
pH: 6 (ref 5.0–8.0)

## 2013-12-12 LAB — GLUCOSE, CAPILLARY
GLUCOSE-CAPILLARY: 144 mg/dL — AB (ref 70–99)
GLUCOSE-CAPILLARY: 193 mg/dL — AB (ref 70–99)
Glucose-Capillary: 233 mg/dL — ABNORMAL HIGH (ref 70–99)
Glucose-Capillary: 211 mg/dL — ABNORMAL HIGH (ref 70–99)

## 2013-12-12 MED ORDER — LISINOPRIL 10 MG PO TABS
10.0000 mg | ORAL_TABLET | Freq: Every day | ORAL | Status: DC
  Administered 2013-12-13 – 2013-12-18 (×4): 10 mg via ORAL
  Filled 2013-12-12 (×7): qty 1

## 2013-12-12 NOTE — Progress Notes (Signed)
Social Work  Social Work Assessment and Plan  Patient Details  Name: Steven [name] MRN: [id number] Date of Birth: 11/19/1960  Today's Date: 12/12/2013  Problem List:  Patient Active Problem List   Diagnosis Date Noted  . Unilateral complete BKA 12/10/2013  . Osteomyelitis with abscess of left 1st and 2nd toes--s/p transmet amputation. 12/10/2013  . Cocaine abuse 12/05/2013  . Tobacco abuse 12/04/2013  . History of cocaine use   . Peripheral vascular disease   . Atrial fibrillation with RVR 12/03/2013  . Severe sepsis 12/03/2013  . Osteomyelitis of ankle or foot, right, acute 12/03/2013  . Osteomyelitis of ankle or foot, acute 11/21/2013  . Gangrene of foot 11/21/2013  . 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 Personal history of colonic adenomas 05/29/2013  . Diabetic retinopathy 12/25/2012  . HLD (hyperlipidemia) 12/08/2012  . Osteomyelitis of foot 08/30/2012  . Peripheral neuropathy 11/19/2011  . ETOH abuse 03/29/2011  . HTN (hypertension) 12/22/2010  . ONYCHOMYCOSIS, TOENAILS 07/18/2006  . Diabetes mellitus type II, uncontrolled 07/18/2006  . VENOUS INSUFFICIENCY 07/18/2006  . CALLUSES, FEET, BILATERAL 07/18/2006   Past Medical History:  Past Medical History  Diagnosis Date  . Uncontrolled diabetes mellitus   . HTN (hypertension)   . History of DVT (deep vein thrombosis)   . Personal history of diabetic foot ulcer   . Peripheral vascular disease     a. Abnl ABIs 2014.  Marland Kitchen Peripheral neuropathy 11/19/2011  . Varicose vein     legs  . Osteomyelitis of foot, right, acute 08/30/2012  . Personal history of colonic adenomas 05/29/2013  . History of cocaine use   . PAF (paroxysmal atrial fibrillation)     a. Dx 11/2013 in setting of sepsis/foot infection.  . Tobacco abuse   . H/O ETOH abuse    Past Surgical History:  Past Surgical History  Procedure Laterality Date  . Amputation  04/21/2011    Procedure: AMPUTATION RAY;  Surgeon: Newt Minion, MD;  Location: [place];   Service: Orthopedics;  Laterality: Right;  Rt foot 2nd ray ampt  . I&d extremity  05/03/2011    Procedure: IRRIGATION AND DEBRIDEMENT EXTREMITY;  Surgeon: Newt Minion, MD;  Location: [place];  Service: Orthopedics;  Laterality: Right;  Irrigation and Debridement Right Foot and Place Acell Xenograft  . Toe amputation  04/24/2012    great toe   right foot  . Amputation  04/24/2012    Procedure: AMPUTATION RAY;  Surgeon: Newt Minion, MD;  Location: [place];  Service: Orthopedics;  Laterality: Right;  Right foot 1st ray amputation  . Amputation Left 04/23/2013    Procedure: AMPUTATION RAY;  Surgeon: Newt Minion, MD;  Location: [place];  Service: Orthopedics;  Laterality: Left;  Left Foot 5th Ray Amputation  . Colonoscopy    . Amputation Left 11/21/2013    Procedure: AMPUTATION RAY;  Surgeon: Newt Minion, MD;  Location: [place];  Service: Orthopedics;  Laterality: Left;  Left Foot 1st & 2nd Ray Amputation  . Amputation Right 12/04/2013    Procedure: AMPUTATION BELOW KNEE;  Surgeon: Marianna Payment, MD;  Location: [place];  Service: Orthopedics;  Laterality: Right;   Social History:  reports that he has been smoking Cigarettes.  He has a 15 pack-year smoking history. He has never used smokeless tobacco. He reports that he drinks about 2.5 - 3 ounces of alcohol per week. He reports that he does not use illicit drugs.  Family / Support Systems Marital Status: Single  Patient Roles: Other (Comment) (Single with no children.) Spouse/Significant Other: NA Children: NA Other Supports: sister, [name] @ [phone] Anticipated Caregiver: self Ability/Limitations of Caregiver: No caregiver.  Has mod I goals. Caregiver Availability: Other (Comment) (Family can check on him periodically.) Family Dynamics: pt describes sister as supportive, however, does not expect her to need to provide any support.  Social History Preferred language: English Religion: None Cultural Background: NA Education: did  not complete school Read: No Write: No Employment Status: Disabled Date Retired/Disabled/Unemployed: "I guess since the 34's" Legal Hisotry/Current Legal Issues: none Guardian/Conservator: none - per MD, pt capable of making decisions on his own behalf   Abuse/Neglect Physical Abuse: Denies Verbal Abuse: Denies Sexual Abuse: Denies Exploitation of patient/patient's resources: Denies Self-Neglect: Denies  Emotional Status Pt's affect, behavior adn adjustment status: pt pleasant, cooperative and complete's interview easily.  Denies any significant emotional distress even r/t amputation.  Will monitor and refer to neuropsych as warranted or for peer visit. Recent Psychosocial Issues: None per pt Pyschiatric History: None per pt Substance Abuse History: Pt adamantly denies any h/o substance abuse.  When noted +UDS for cocaine on admit he states, "I was just trying something out. I don't do that stuff."  Patient / Family Perceptions, Expectations & Goals Pt/Family understanding of illness & functional limitations: pt and sister with basic understanding of medical issues which ultimatley resulted in amputations.  Basic understanding and appreciation of current functional limitations/ need for CIR, however, pt states, "so I guess I'll be here for about 4 months..."  provided education on LOS. Premorbid pt/family roles/activities: Pt living alone and independent overall.  Using his scooter to travel outside of the home to grocery store or bank. Anticipated changes in roles/activities/participation: Little change anticipated if reaching mod i goals Pt/family expectations/goals: "i just need to be able to do for myself."  Community Resources Premorbid Home Care/DME Agencies: Other (Comment) ([facility] plus [facility] aide (attempting to confirm which agency)) Transportation available at discharge: yes - [place] transportation as needed Resource referrals recommended: Support group (specify)  Discharge  Planning Living Arrangements: Alone Support Systems: Other relatives Type of Residence: Private residence Insurance Resources: Medicaid (specify county) Financial Resources: Aeronautical engineer Screen Referred: No Living Expenses: Education officer, community Management: Patient Does the patient have any problems obtaining your medications?: No Home Management: pt Patient/Family Preliminary Plans: pt plans to return to his own apt with [facility] and PCS aide support Social Work Anticipated Follow Up Needs: HH/OP Expected length of stay: 5-7days  Clinical Impression Pleasant gentleman here following amputation and with h/o poor medical compliance.  Actually has mod i goals and anticipate short LOS with plans to d/c to his own apt alone.  Pt denies any significant emotional distress - will monitor.  May benefit from peer visit?  Will follow for support and d/c planning needs.    Katharin Schneider 12/12/2013, 11:11 AM

## 2013-12-12 NOTE — Plan of Care (Signed)
Problem: RH PAIN MANAGEMENT Goal: RH STG PAIN MANAGED AT OR BELOW PT'S PAIN GOAL Less than or equal to 5 Outcome: Not Progressing Patient rate pain at 9. 15 mg Oxy IR given.adm

## 2013-12-12 NOTE — Progress Notes (Addendum)
Physical Therapy Session Note  Patient Details  Name: Steven [name] MRN: [id number] Date of Birth: Aug 26, 1960  Today's Date: 12/12/2013 Session 1: Time: 0932-1032 Time Calculation (min): 60 min Session 2: 0131-4388 Time Calculation (min): 45 min  Short Term Goals: Week 1:     Skilled Therapeutic Interventions/Progress Updates:    Session 1: Pt received seated in w/c, agreeable to participate in therapy. Session focused on functional transfers, phantom pain management, and household ambulation. Pt w/ supervision to manage w/c parts and prepare for propulsion, then propelled 100' to rehab gym. Pt able to setup w/c for MSPT w/ min cues, then completed MSPT to mat table w/ CGA. Pt completed x10 sit/stands from elevated mat table to work on motor sequencing. Pt frequently expressed discomfort w/ [employer] shoe in standing, educated pt on importance of protecting intact limb in order to maximize functional independence. After upright activity, pt reported increased phantom pain. Initiated mirror therapy w/ pt seated EOB w/ LLE motion in multiple planes. Pt reports that pain reduced from 9/10 to 8/10 after intervention. Pt completed MSPT back to w/c and then ambulated 15' then 20' w/ RW w/ hop to gait w/ MinA. Pt left in recliner w/ all needs within reach.  Session 2: Pt received seated in recliner, agreeable to participate in therapy. Session focused on functional ambulation, transfers, and pre-gait activities. Pt w/ MSPT recliner to/from w/c w/ CGA and min cueing for setup. Pt propelled w/c w/ supervision and min cueing for parts management. Pt ambulated 25' w/ RW and MinA for safety with cueing for upright posture. In rehab gym, pt completed 2x10 steps over ankle weight w/ RW in order to improve foot clearance in hop to gait. Pt also completed 2x10 seated pushups w/ wooden handles in order to promote scapular depression and improved gait mechanics w/ RW, required max cueing to engage scapular  retractors/depressors. Pt propelled w/c to practice apartment and completed MSPT w/c <> couch w/ min cueing for setup. Pt ambulated 20' w/ RW w/ MinA, noted increased L foot clearance vs. Earlier ambulation. Pt left in room in recliner w/ all needs within reach.  Therapy Documentation Precautions:  Precautions Precautions: Fall Required Braces or Orthoses:  (ACE wrap RLE) Restrictions Weight Bearing Restrictions: Yes RLE Weight Bearing: Non weight bearing LLE Weight Bearing: Weight bearing as tolerated Pain: Pain Assessment Pain Assessment: 0-10 Pain Score: 9  Pain Type: Phantom pain Pain Location: Leg Pain Orientation: Right Pain Descriptors / Indicators: Throbbing Pain Onset: Gradual Patients Stated Pain Goal: 7 Pain Intervention(s): Medication (See eMAR) (stump elevated) Locomotion : Ambulation Ambulation/Gait Assistance: 4: Min assist   See FIM for current functional status  Therapy/Group: Individual Therapy  Rada Hay Rada Hay, PT, DPT  12/12/2013, 11:51 AM

## 2013-12-12 NOTE — IPOC Note (Signed)
Overall Plan of Care [facility]) Patient Details Name: Steven [name] MRN: [id number] DOB: 1961/05/05  Admitting Diagnosis: R BKA  Hospital Problems: Principal Problem:   Unilateral complete BKA Active Problems:   Diabetes mellitus type II, uncontrolled   HTN (hypertension)   Peripheral neuropathy   Osteomyelitis of foot   Atrial fibrillation with RVR   Osteomyelitis with abscess of left 1st and 2nd toes--s/p transmet amputation.     Functional Problem List: Nursing Edema;Medication Management;Pain;Skin Integrity;Sensory  PT Balance;Endurance;Pain;Motor;Safety;Sensory  OT Balance;Edema;Endurance;Motor;Pain;Skin Integrity  SLP    TR         Basic ADL's: OT Bathing;Dressing;Toileting     Advanced  ADL's: OT       Transfers: PT Bed Mobility;Bed to Chair;Car;Furniture  OT Toilet;Tub/Shower     Locomotion: PT Ambulation;Wheelchair Mobility     Additional Impairments: OT None  SLP        TR      Anticipated Outcomes Item Anticipated Outcome  Self Feeding no goal set  Swallowing      Basic self-care  mod I   Toileting  mod I    Bathroom Transfers mod I   Bowel/Bladder  mod I  Transfers  mod I  Locomotion  mod I  Communication     Cognition     Pain  less than 3 out of 10  Safety/Judgment  min assist   Therapy Plan: PT Intensity: Minimum of 1-2 x/day ,45 to 90 minutes PT Frequency: 5 out of 7 days PT Duration Estimated Length of Stay: 5-7 days OT Intensity: Minimum of 1-2 x/day, 45 to 90 minutes OT Frequency: 5 out of 7 days OT Duration/Estimated Length of Stay: 5-7 days         Team Interventions: Nursing Interventions Patient/Family Education;Disease Management/Prevention;Pain Management;Medication Management;Skin Care/Wound Management;Cognitive Remediation/Compensation;Discharge Planning;Bowel Management  PT interventions Ambulation/gait training;Balance/vestibular training;Community reintegration;Discharge planning;DME/adaptive equipment  instruction;Functional mobility training;Pain management;Neuromuscular re-education;Patient/family education;Skin care/wound management;Disease management/prevention;Splinting/orthotics;Therapeutic Activities;Therapeutic Exercise;UE/LE Strength taining/ROM;Wheelchair propulsion/positioning  OT Interventions Balance/vestibular training;Community reintegration;Discharge planning;Disease mangement/prevention;Pain management;UE/LE Coordination activities;Skin care/wound managment;Self Care/advanced ADL retraining;UE/LE Strength taining/ROM;Functional mobility training;Psychosocial support;Therapeutic Exercise;DME/adaptive equipment instruction;Patient/family education;Therapeutic Activities;Wheelchair propulsion/positioning  SLP Interventions    TR Interventions    SW/CM Interventions Discharge Planning;Psychosocial Support;Patient/Family Education    Team Discharge Planning: Destination: PT-Home ,OT- Home , SLP-  Projected Follow-up: PT-Home health PT, OT-  Other (comment) (TBD), SLP-  Projected Equipment Needs: PT-To be determined, OT- Tub/shower bench, SLP-  Equipment Details: PT-pt owns: power w/c, RW, SP cane, OT-  Patient/family involved in discharge planning: PT- Patient,  OT-Patient, SLP-   MD ELOS: 7-10d Medical Rehab Prognosis:  Good Assessment: 53 y.o. male with h/o DM type 2--poorly controlled with peripheral neuropathy, HTN, multiple bilateral diabetic foot ulcers with osteomyelitis and infection who had refused BKA in the past but was admitted on 12/03/13 with severe sepsis due to worsening of right foot ulcers with purulent drainage as well as gas gangrene. He was noted to have A fib with RVR and UDS + cocaine. No BB in setting of cocaine and long term anticoagulation recommended by Dr. Stanford Breed. 2D echo with EF 55-65% and no wall abnormality noted. He underwent R-transtibial amputation by Dr. Erlinda Hong on 12/04/13   Now requiring 24/7 Rehab RN,MD, as well as CIR level PT, OT   Treatment team  will focus on ADLs and mobility with goals set at Mod I   See Team Conference Notes for weekly updates to the plan of care

## 2013-12-12 NOTE — Progress Notes (Signed)
Occupational Therapy Session Note  Patient Details  Name: Steven [name] MRN: [id number] Date of Birth: [birth year]  Today's Date: 12/12/2013 Time: 0728-0828 Time Calculation (min): 60 min  Short Term Goals: Week 1:  OT Short Term Goal 1 (Week 1): Short term goals = LTG due to short LOS  Skilled Therapeutic Interventions/Progress Updates: ADL-retraining with focus on dynamic standing balance, transfers, pain management, w/c mobility, and adapted dressing skills.   Patient able to complete bed mobility and transfer w/o use of bed rail this date.   Patient self-propelled w/c to bathroom but required min assist to problem-solve relating to approach to shower chair.  Patient performed stand-pivot transfer to shower chair using grab bar and then bathed unassisted.    OT noted skin breakdown on buttocks during transfer back to w/c.  RN alerted, MD inspected wound and confirmed use of barrier cream with recommendation to avoid bed rest due to friction/shear while supine.   Patient able to stand fully this date at [place] to dress lower body but he could not maintain balance to pull up his pants, requiring min assist for lower body dressing just to pull up his pants.   Patient is independent with self-feeding and grooming at sink in w/c.     Therapy Documentation Precautions:  Precautions Precautions: Fall Required Braces or Orthoses:  (ACE wrap RLE) Restrictions Weight Bearing Restrictions: Yes RLE Weight Bearing: Non weight bearing LLE Weight Bearing: Weight bearing as tolerated  Vital Signs: Therapy Vitals Temp: 98.3 F (36.8 C) Temp src: Oral Pulse Rate: 92 Resp: 18 BP: 112/75 mmHg Patient Position (if appropriate): Sitting Oxygen Therapy SpO2: 96 % O2 Device: None (Room air)  Pain: Pain Assessment Pain Assessment: 0-10 Pain Score: 9  Pain Type: Acute pain Pain Location: Leg Pain Orientation: Distal;Right Pain Descriptors / Indicators: Throbbing Patients Stated Pain Goal: 3 Pain  Intervention(s): Repositioned;RN made aware;Medication (See eMAR)  See FIM for current functional status  Therapy/Group: Individual Therapy  Second session: Time: 1130-1200 Time Calculation (min):  30 min  Pain Assessment: 7/10, RLE  Skilled Therapeutic Interventions: Therapeutic exercises with focus on improved UE strengthening, general endurance using Sci-Fit.   Patient completed 19 min using Sci-Fit, level 3, without complaint of fatigue this session.   HR/BP  95 bpm, 101/79.   See FIM for current functional status  Therapy/Group: Individual Therapy  Faustina Gebert 12/12/2013, 8:54 AM

## 2013-12-12 NOTE — Progress Notes (Addendum)
53 y.o. male with h/o DM type 2--poorly controlled with peripheral neuropathy, HTN, multiple bilateral diabetic foot ulcers with osteomyelitis and infection who had refused BKA in the past but was admitted on 12/03/13 with severe sepsis due to worsening of right foot ulcers with purulent drainage as well as gas gangrene. He was noted to have A fib with RVR and UDS + cocaine. No BB in setting of cocaine and long term anticoagulation recommended by Dr. Stanford Breed. 2D echo with EF 55-65% and no wall abnormality noted. 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 Cardiology recommends ischemic work up once he recovers from surgery. PT evaluation done and patient with heavy reliance on UE. Patient with left foot wound with drainage and Dr. Jess Barters office contacted for input. Sutures to stay in place for a week and silvadene/nitro patches   Subjective/Complaints: No severe pain , no BM since rehab admit Review of Systems - Negative except fatigue after PT yest  Objective: Vital Signs: Blood pressure 101/62, pulse 72, temperature 98.3 F (36.8 C), temperature source Oral, resp. rate 18, weight 101.969 kg (224 lb 12.8 oz), SpO2 96.00%. No results found. Results for orders placed during the hospital encounter of 12/09/13 (from the past 72 hour(s))  GLUCOSE, CAPILLARY     Status: Abnormal   Collection Time    12/09/13  6:22 PM      Result Value Ref Range   Glucose-Capillary 166 (*) 70 - 99 mg/dL   Comment 1 Documented in Chart    GLUCOSE, CAPILLARY     Status: Abnormal   Collection Time    12/09/13  9:14 PM      Result Value Ref Range   Glucose-Capillary 239 (*) 70 - 99 mg/dL  GLUCOSE, CAPILLARY     Status: Abnormal   Collection Time    12/10/13  7:20 AM      Result Value Ref Range   Glucose-Capillary 169 (*) 70 - 99 mg/dL  GLUCOSE, CAPILLARY     Status: Abnormal   Collection Time    12/10/13 12:12 PM      Result Value Ref Range   Glucose-Capillary 147 (*) 70 - 99 mg/dL  GLUCOSE, CAPILLARY     Status: Abnormal   Collection Time    12/10/13  4:37 PM      Result Value Ref Range   Glucose-Capillary 238 (*) 70 - 99 mg/dL   Comment 1 Notify RN    GLUCOSE, CAPILLARY     Status: Abnormal   Collection Time    12/10/13  9:45 PM      Result Value Ref Range   Glucose-Capillary 168 (*) 70 - 99 mg/dL  CBC WITH DIFFERENTIAL     Status: Abnormal   Collection Time    12/11/13  4:37 AM      Result Value Ref Range   WBC 7.2  4.0 - 10.5 K/uL   RBC 3.20 (*) 4.22 - 5.81 MIL/uL   Hemoglobin 9.1 (*) 13.0 - 17.0 g/dL   HCT 27.6 (*) 39.0 - 52.0 %   MCV 86.3  78.0 - 100.0 fL   MCH 28.4  26.0 - 34.0 pg   MCHC 33.0  30.0 - 36.0 g/dL   RDW 14.7  11.5 - 15.5 %   Platelets 396  150 - 400 K/uL   Neutrophils Relative % 57  43 - 77 %   Neutro Abs 4.2  1.7 - 7.7 K/uL   Lymphocytes Relative 25  12 - 46 %   Lymphs Abs 1.8  0.7 - 4.0 K/uL   Monocytes Relative 13 (*) 3 - 12 %   Monocytes Absolute 1.0  0.1 - 1.0 K/uL   Eosinophils Relative 4  0 - 5 %   Eosinophils Absolute 0.3  0.0 - 0.7 K/uL   Basophils Relative 1  0 - 1 %   Basophils Absolute 0.1  0.0 - 0.1 K/uL  COMPREHENSIVE METABOLIC PANEL     Status: Abnormal   Collection Time    12/11/13  4:37 AM      Result Value Ref Range   Sodium 134 (*) 137 - 147 mEq/L   Potassium 4.2  3.7 - 5.3 mEq/L   Chloride 94 (*) 96 - 112 mEq/L   CO2 27  19 - 32 mEq/L   Glucose, Bld 142 (*) 70 - 99 mg/dL   BUN 7  6 - 23 mg/dL   Creatinine, Ser 0.70  0.50 - 1.35 mg/dL   Calcium 9.4  8.4 - 10.5 mg/dL   Total Protein 7.6  6.0 - 8.3 g/dL   Albumin 1.9 (*) 3.5 - 5.2 g/dL   AST 41 (*) 0 - 37 U/L   ALT 50  0 - 53 U/L   Alkaline Phosphatase 107  39 - 117 U/L   Total Bilirubin 0.3  0.3 - 1.2 mg/dL   GFR calc non Af Amer >90  >90 mL/min   GFR calc Af Amer >90  >90 mL/min   Comment: (NOTE)     The eGFR has been calculated using the CKD EPI equation.     This calculation has not been validated in all  clinical situations.     eGFR's persistently <90 mL/min signify possible Chronic Kidney     Disease.   Anion gap 13  5 - 15  GLUCOSE, CAPILLARY     Status: Abnormal   Collection Time    12/11/13  7:23 AM      Result Value Ref Range   Glucose-Capillary 141 (*) 70 - 99 mg/dL  GLUCOSE, CAPILLARY     Status: Abnormal   Collection Time    12/11/13 11:38 AM      Result Value Ref Range   Glucose-Capillary 177 (*) 70 - 99 mg/dL  GLUCOSE, CAPILLARY     Status: Abnormal   Collection Time    12/11/13  4:27 PM      Result Value Ref Range   Glucose-Capillary 182 (*) 70 - 99 mg/dL  GLUCOSE, CAPILLARY     Status: Abnormal   Collection Time    12/11/13  9:06 PM      Result Value Ref Range   Glucose-Capillary 290 (*) 70 - 99 mg/dL  GLUCOSE, CAPILLARY     Status: Abnormal   Collection Time    12/12/13  7:21 AM      Result Value Ref Range   Glucose-Capillary 144 (*) 70 - 99 mg/dL   Comment 1 Notify RN        Nursing note and vitals reviewed.  Constitutional: He is oriented to person, place, and time. He appears well-developed and well-nourished.  HENT:  Head: Normocephalic and atraumatic.  Eyes: Conjunctivae are normal. Pupils are equal, round, and reactive to light.  Neck: Normal range of motion. Neck supple.  Cardiovascular: Normal rate and regular rhythm.  No murmur heard.  Respiratory: Effort normal and breath sounds normal. No respiratory distress. He has no wheezes.  GI: Soft. Bowel sounds are normal. He exhibits  no distension. There is no tenderness.  Musculoskeletal:  R-BKA with coban compressive dressing. Neurological: He is alert and oriented to person, place, and time.  Skin: Skin is warm and dry. Grade 1-2 Sacral, abrasion type Motor: 5/5 bilateral deltoid, bicep, tricep, grip  4/5 left hip flexor knee extensor 4/5 right hip flexor, trace left ankle dorsiflexor plantar flexor 0 toe flexion-extension   Assessment/Plan: 1. Functional deficits secondary to Right BKA  secondary to gangrene which require 3+ hours per day of interdisciplinary therapy in a comprehensive inpatient rehab setting. Physiatrist is providing close team supervision and 24 hour management of active medical problems listed below. Physiatrist and rehab team continue to assess barriers to discharge/monitor patient progress toward functional and medical goals. FIM: FIM - Bathing Bathing Steps Patient Completed: Chest;Right Arm;Left Arm;Abdomen;Front perineal area;Buttocks;Right upper leg;Left upper leg Bathing: 6: Assistive device (Comment)  FIM - Upper Body Dressing/Undressing Upper body dressing/undressing: 0: Wears gown/pajamas-no public clothing FIM - Lower Body Dressing/Undressing Lower body dressing/undressing steps patient completed: Thread/unthread right underwear leg;Thread/unthread left underwear leg Lower body dressing/undressing: 0: Wears Interior and spatial designer  FIM - Toileting Toileting: 6: More than reasonable amount of time     FIM - Control and instrumentation engineer Devices: Arm rests Bed/Chair Transfer: 6: Supine > Sit: No assist;6: Bed > Chair or W/C: No assist  FIM - Locomotion: Wheelchair Locomotion: Wheelchair: 2: Travels 50 - 149 ft with minimal assistance (Pt.>75%) FIM - Locomotion: Ambulation Locomotion: Ambulation Assistive Devices: Administrator Ambulation/Gait Assistance: 4: Min assist Locomotion: Ambulation: 1: Travels less than 50 ft with minimal assistance (Pt.>75%)  Comprehension Comprehension Mode: Auditory Comprehension: 6-Follows complex conversation/direction: With extra time/assistive device  Expression Expression Mode: Verbal Expression: 7-Expresses complex ideas: With no assist  Social Interaction Social Interaction: 7-Interacts appropriately with others - No medications needed.  Problem Solving Problem Solving: 5-Solves complex 90% of the time/cues < 10% of the time  Memory Memory: 7-Complete  Independence: No helper  Problem List and Plan:  1. Functional deficits secondary to R-BKA and recent left transmet amputation.  2. DVT Prophylaxis/Anticoagulation: Pharmaceutical: Xarelto  3. Pain Management: Continue neurontin for neuropathy  4. Mood: Will have LCSW follow for evaluation and support.  5. Neuropsych: This patient is capable of making decisions on his own behalf.  6. DM type 2: Will monitor with ac/hs checks. Continue lantus 30 units at bedtime with SSI for elevated BS. Titrate lantus for better control. Educate patient on importance of medication compliance.  7. A fib: Will monitor HR every 8 hours. Continue Cardizem and Xarelto.  8. HTN: Monitor BP every 8 hours. D/C HCTZ,reduce lisinopril and  Cont Cardizem.  9. ABLA: Will add iron supplement. Recheck in am.  10. Hyponatremia: Due to sepsis resolving. Will recheck in am.  11. Left transmet amputation: Shows signs of ischemia. Will ask surgeon to evaluate prior to suture removal and resume doxycyline. Will add silvadene and NTG patch per recommendations. Will order [facility] sandal   LOS (Days) 3 A FACE TO FACE EVALUATION WAS PERFORMED  Steven [name] 12/12/2013, 7:55 AM

## 2013-12-13 ENCOUNTER — Inpatient Hospital Stay (HOSPITAL_COMMUNITY): Payer: Medicaid Other | Admitting: Physical Therapy

## 2013-12-13 ENCOUNTER — Inpatient Hospital Stay (HOSPITAL_COMMUNITY): Payer: Medicaid Other | Admitting: *Deleted

## 2013-12-13 DIAGNOSIS — IMO0001 Reserved for inherently not codable concepts without codable children: Secondary | ICD-10-CM

## 2013-12-13 DIAGNOSIS — I96 Gangrene, not elsewhere classified: Secondary | ICD-10-CM

## 2013-12-13 DIAGNOSIS — E1165 Type 2 diabetes mellitus with hyperglycemia: Secondary | ICD-10-CM

## 2013-12-13 DIAGNOSIS — S88119A Complete traumatic amputation at level between knee and ankle, unspecified lower leg, initial encounter: Secondary | ICD-10-CM

## 2013-12-13 DIAGNOSIS — Z5189 Encounter for other specified aftercare: Secondary | ICD-10-CM

## 2013-12-13 DIAGNOSIS — I4891 Unspecified atrial fibrillation: Secondary | ICD-10-CM

## 2013-12-13 LAB — GLUCOSE, CAPILLARY
GLUCOSE-CAPILLARY: 209 mg/dL — AB (ref 70–99)
GLUCOSE-CAPILLARY: 188 mg/dL — AB (ref 70–99)
Glucose-Capillary: 77 mg/dL (ref 70–99)
Glucose-Capillary: 176 mg/dL — ABNORMAL HIGH (ref 70–99)

## 2013-12-13 LAB — URINE CULTURE: Colony Count: 6000

## 2013-12-13 MED ORDER — KETOCONAZOLE 2 % EX CREA
TOPICAL_CREAM | Freq: Every day | CUTANEOUS | Status: DC
  Administered 2013-12-13 – 2013-12-18 (×6): via TOPICAL
  Filled 2013-12-13: qty 15

## 2013-12-13 MED ORDER — LINACLOTIDE 145 MCG PO CAPS
145.0000 ug | ORAL_CAPSULE | Freq: Every day | ORAL | Status: DC
  Administered 2013-12-13 – 2013-12-15 (×3): 145 ug via ORAL
  Filled 2013-12-13 (×4): qty 1

## 2013-12-13 NOTE — Progress Notes (Signed)
Occupational Therapy Session Note  Patient Details  Name: Steven [name] MRN: [id number] Date of Birth: [birth year]  Today's Date: 12/13/2013 Time:  - 0850-0930  (50 min)    Short Term Goals: Week 1:  OT Short Term Goal 1 (Week 1): Short term goals = LTG due to short LOS  Skilled Therapeutic Interventions/Progress Updates:      1st session:  Lying in bed.  Pt agreed to shower.  Addressed bed mobility, transfers, sit to stand, dynamic sitting balance.  Pt. Gathered clothes by reaching into dresser drawers from bed level.  He applied the plastic bags to both LE. Transferred to wc with squat pivot technique and SBA.  Also did same to shower seat.  Pt. Bathed with no issues with balance.  Pt. Transferred out to wc with SBA.  Dressed EOB with no problems.    Left pt in wc with call bell,phone within reach.     Therapy Documentation Precautions:  Precautions Precautions: Fall Required Braces or Orthoses:  (ACE wrap RLE) Restrictions Weight Bearing Restrictions: Yes RLE Weight Bearing: Non weight bearing LLE Weight Bearing: Weight bearing as tolerated    Time:  1530-1430  (60 mn)  2nd session Pain:   4/10   Individual session  Addressed kitchen activtiy with pt at wc level.  Pt gathered supplies and used oven with questioning cues for safety.  Pt. Needed one reminder to grease muffin pan.  He is unable to read and was recalling the mix from memory.  Pt. Was independent with getting items at wc level.    Pain: Pain Assessment Pain Assessment: 0-10 Pain Score: 9  Pain Type: Surgical pain Pain Location: Leg Pain Orientation: Right Pain Descriptors / Indicators: Throbbing Pain Frequency: Constant Pain Onset: On-going Patients Stated Pain Goal: 7 Pain Intervention(s): Medication (See eMAR)           See FIM for current functional status  Therapy/Group: Individual Therapy  Lisa Roca 12/13/2013, 9:25 AM

## 2013-12-13 NOTE — Progress Notes (Signed)
Physical Therapy Session Note  Patient Details  Name: CLEMON DEVAUL MRN: [id number] Date of Birth: 19-Mar-1961  Today's Date: 12/13/2013 Time: 2952-8413 Time Calculation (min): 34 min   Skilled Therapeutic Interventions/Progress Updates:    Pt with most difficulty in session transferring onto commode including negotiating barrier between surfaces. Pt demonstrates improved safety in other transfers with cues for wheelchair position. Pt trialed with RW for transfers but difficult for pt to perform full sit to stand at this time. Lateral transfers working best with pt at this time.   Therapy Documentation Precautions:  Precautions Precautions: Fall Required Braces or Orthoses:  (ACE wrap RLE) Restrictions Weight Bearing Restrictions: Yes RLE Weight Bearing: Non weight bearing LLE Weight Bearing: Weight bearing as tolerated General:   Pain: Pt does not report pain Mobility:  Pt performs Min A transfer from lower surface and Mod A onto toilet with cues for weight shift, technique, and hand placement. Locomotion : Ambulation Ambulation/Gait Assistance: 4: Min assist  Other Treatments:  Pt performs repeated transfer training on variety of surfaces including, bed, couch, and commode with cues for technique. Pt performs static and dynamic sitting balance tasks with dual cog and motor tasks including weight shifting, reaching, grasping, and LE manipulation in functional context. Pt educated on safety in mobility and AD for increased safety in home. Pt left in wheelchair with needs in reach.   See FIM for current functional status  Therapy/Group: Individual Therapy  Monia Pouch 12/13/2013, 4:41 PM

## 2013-12-13 NOTE — Progress Notes (Signed)
Physical Therapy Session Note  Patient Details  Name: Steven [name] MRN: [id number] Date of Birth: 1960/12/12  Today's Date: 12/13/2013 Time: 1308-6578 Time Calculation (min): 60 min   Skilled Therapeutic Interventions/Progress Updates:    Pt continues to reports RLE phantom pain in session. Mirror therapy performed with centralization out of phantom limb reported although no change in intensity. Pt benefits from cues for hand placement in transfers so pt does not push wheelchair away mid transfer. Pt would continue to benefit from skilled PT services to increase functional mobility.  Therapy Documentation Precautions:  Precautions Precautions: Fall Required Braces or Orthoses:  (ACE wrap RLE) Restrictions Weight Bearing Restrictions: Yes RLE Weight Bearing: Non weight bearing LLE Weight Bearing: Weight bearing as tolerated Pain: Pain Assessment Pain Score: 7  Mobility:  Pt Min A for lateral transfers with cues for hand placement and technique, supervision to attain prone Other Treatments:  Prone lying x8', R knee flexion in prone 3x10, quad stretch in prone, mat press-ups in sitting 2x10, sitting anterior weight shifts 2x10. Pt performs static and dynamic sitting balance tasks with dual cog and motor tasks including weight shifting, reaching, grasping, and LE manipulation in functional context. Mirror therapy with B/L LAQs, ankle pumps, hip abd, LE rotation performed B/L. Pt left sitting in wheelchair with needs.  See FIM for current functional status  Therapy/Group: Individual Therapy  Monia Pouch 12/13/2013, 12:14 PM

## 2013-12-13 NOTE — Progress Notes (Signed)
Steven [name] is a 53 y.o. male [birth year] [id number]  Subjective: C/o pain and rash between buttocks. C/o constipation. Slept well. Feeling OK.  Objective: Vital signs in last 24 hours: Temp:  [98.3 F (36.8 C)-99.6 F (37.6 C)] 98.4 F (36.9 C) (07/04 0642) Pulse Rate:  [67-92] 67 (07/04 0642) Resp:  [17-18] 18 (07/04 0642) BP: (98-140)/(56-75) 111/56 mmHg (07/04 0642) SpO2:  [98 %] 98 % (07/04 6063) Weight change:  Last BM Date: 12/07/13 (per report from other unit)  Intake/Output from previous day: 07/03 0701 - 07/04 0700 In: 720 [P.O.:720] Out: 2000 [Urine:2000] Last cbgs: CBG (last 3)   Recent Labs  12/12/13 1632 12/12/13 2143 12/13/13 0736  GLUCAP 211* 193* 77     Physical Exam General: No apparent distress   HEENT: not dry Lungs: Normal effort. Lungs clear to auscultation, no crackles or wheezes. Cardiovascular: Regular rate and rhythm, no edema Abdomen: S/NT/ND; BS(+) Musculoskeletal:  unchanged Neurological: No new neurological deficits Wounds: N/A    Skin: intertrigo between buttocks  Aging changes Mental state: Alert, oriented, cooperative    Lab Results: BMET    Component Value Date/Time   NA 134* 12/11/2013 0437   K 4.2 12/11/2013 0437   CL 94* 12/11/2013 0437   CO2 27 12/11/2013 0437   GLUCOSE 142* 12/11/2013 0437   BUN 7 12/11/2013 0437   CREATININE 0.70 12/11/2013 0437   CREATININE 1.07 02/27/2013 1451   CALCIUM 9.4 12/11/2013 0437   GFRNONAA >90 12/11/2013 0437   GFRAA >90 12/11/2013 0437   CBC    Component Value Date/Time   WBC 10.4 12/12/2013 1200   RBC 3.41* 12/12/2013 1200   HGB 9.7* 12/12/2013 1200   HCT 30.0* 12/12/2013 1200   PLT 401* 12/12/2013 1200   MCV 88.0 12/12/2013 1200   MCH 28.4 12/12/2013 1200   MCHC 32.3 12/12/2013 1200   RDW 14.7 12/12/2013 1200   LYMPHSABS 2.1 12/12/2013 1200   MONOABS 0.9 12/12/2013 1200   EOSABS 0.3 12/12/2013 1200   BASOSABS 0.0 12/12/2013 1200    Studies/Results: No results found.  Medications: I have reviewed the  patient's current medications.  Assessment/Plan:  1. Functional deficits secondary to R-BKA and recent left transmet amputation.  2. DVT Prophylaxis/Anticoagulation: Pharmaceutical: Xarelto  3. Pain Management: Continue neurontin for neuropathy  4. Mood: Steven have LCSW follow for evaluation and support.  5. Neuropsych: This patient is capable of making decisions on his own behalf.  6. DM type 2: Steven monitor with ac/hs checks. Continue lantus 30 units at bedtime with SSI for elevated BS. Titrate lantus for better control. Educate patient on importance of medication compliance.  7. A fib: Steven monitor HR every 8 hours. Continue Cardizem and Xarelto.  8. HTN: Monitor BP every 8 hours. D/C HCTZ,reduce lisinopril and Cont Cardizem.  9. ABLA: Steven add iron supplement. Recheck in am.  10. Hyponatremia: Due to sepsis resolving. Steven recheck in am.  11. Left transmet amputation: Shows signs of ischemia. Steven ask surgeon to evaluate prior to suture removal and resume doxycyline. On silvadene and NTG patch per recommendations. 12. Constipation: see Rx 13. Intertrigo between buttocks: see Rx    Length of stay, days: 4  Walker Kehr , MD 12/13/2013, 8:34 AM

## 2013-12-14 ENCOUNTER — Inpatient Hospital Stay (HOSPITAL_COMMUNITY): Payer: Medicaid Other

## 2013-12-14 ENCOUNTER — Inpatient Hospital Stay (HOSPITAL_COMMUNITY): Payer: Medicaid Other | Admitting: *Deleted

## 2013-12-14 DIAGNOSIS — I10 Essential (primary) hypertension: Secondary | ICD-10-CM

## 2013-12-14 DIAGNOSIS — M86179 Other acute osteomyelitis, unspecified ankle and foot: Secondary | ICD-10-CM

## 2013-12-14 LAB — GLUCOSE, CAPILLARY
Glucose-Capillary: 99 mg/dL (ref 70–99)
Glucose-Capillary: 164 mg/dL — ABNORMAL HIGH (ref 70–99)
Glucose-Capillary: 188 mg/dL — ABNORMAL HIGH (ref 70–99)
Glucose-Capillary: 189 mg/dL — ABNORMAL HIGH (ref 70–99)

## 2013-12-14 NOTE — Progress Notes (Signed)
Occupational Therapy Session Note  Patient Details  Name: Steven [name] MRN: [id number] Date of Birth: 1961-03-13  Today's Date: 12/14/2013 Time: 0830-0930 Time Calculation (min): 60 min  1st session  Short Term Goals: Week 1:  OT Short Term Goal 1 (Week 1): Short term goals = LTG due to short LOS  Skilled Therapeutic Interventions/Progress Updates:     1st session:   Lying in bed.  Pt. Gathered part of supplies and propelled wc to ADL apartment.  Pt transferred to tub transfer bench with SBA.  Performed lateral leans to wash peri area.  Pt. Dressed self with supervision.  Performed wc push up to assist with pulling briefs up to waist.  Propelled wc back to room and nursin called to change dressings.     Therapy Documentation Precautions:  Precautions Precautions: Fall Required Braces or Orthoses:  (ACE wrap RLE) Restrictions Weight Bearing Restrictions: Yes RLE Weight Bearing: Non weight bearing LLE Weight Bearing: Weight bearing as tolerated     Pain: Pain Assessment Pain Assessment: 0-10 Pain Score: 6  Pain Type: Acute pain;Surgical pain Pain Location: Leg Pain Orientation: Right Pain Descriptors / Indicators: Aching;Throbbing (phantom pain) Pain Frequency: Constant Pain Onset: On-going Patients Stated Pain Goal: 2 Pain Intervention(s): Medication (See eMAR)    Exercises:  Time:  1400-1445  (45 min)  2nd session  Pain:  5/10 shoulders from therapy session Individual session:  Addressed UE AROM and strength, wc mobility, transfers.  Pt. Performed SCI FIT for 10 mins at 6 wkload clockwise and then 5 minutes counter clockwise.  He was independent with rolling wc from room to gym and back.  Transferred back to bed with SBA and no verbal instructions       See FIM for current functional status  Therapy/Group: Individual Therapy  Lisa Roca 12/14/2013, 9:23 AM

## 2013-12-14 NOTE — Progress Notes (Signed)
Steven [name] is a 53 y.o. male [birth year] [id number]  Subjective: C/o pain and rash between buttocks - better. C/o constipation - better. Slept well. Feeling OK.  Objective: Vital signs in last 24 hours: Temp:  [98.2 F (36.8 C)-99 F (37.2 C)] 99 F (37.2 C) (07/05 0612) Pulse Rate:  [74-88] 74 (07/05 0612) Resp:  [18-19] 18 (07/05 0612) BP: (99-114)/(54-67) 114/54 mmHg (07/05 0612) SpO2:  [97 %-99 %] 97 % (07/05 0612) Weight change:  Last BM Date: 12/10/13  Intake/Output from previous day: 07/04 0701 - 07/05 0700 In: 960 [P.O.:960] Out: 1000 [Urine:1000] Last cbgs: CBG (last 3)   Recent Labs  12/13/13 1627 12/13/13 2050 12/14/13 0725  GLUCAP 176* 188* 99     Physical Exam General: No apparent distress   HEENT: not dry Lungs: Normal effort. Lungs clear to auscultation, no crackles or wheezes. Cardiovascular: Regular rate and rhythm, no edema Abdomen: S/NT/ND; BS(+) Musculoskeletal:  unchanged Neurological: No new neurological deficits Wounds: N/A    Skin: intertrigo between buttocks  Aging changes Mental state: Alert, oriented, cooperative    Lab Results: BMET    Component Value Date/Time   NA 134* 12/11/2013 0437   K 4.2 12/11/2013 0437   CL 94* 12/11/2013 0437   CO2 27 12/11/2013 0437   GLUCOSE 142* 12/11/2013 0437   BUN 7 12/11/2013 0437   CREATININE 0.70 12/11/2013 0437   CREATININE 1.07 02/27/2013 1451   CALCIUM 9.4 12/11/2013 0437   GFRNONAA >90 12/11/2013 0437   GFRAA >90 12/11/2013 0437   CBC    Component Value Date/Time   WBC 10.4 12/12/2013 1200   RBC 3.41* 12/12/2013 1200   HGB 9.7* 12/12/2013 1200   HCT 30.0* 12/12/2013 1200   PLT 401* 12/12/2013 1200   MCV 88.0 12/12/2013 1200   MCH 28.4 12/12/2013 1200   MCHC 32.3 12/12/2013 1200   RDW 14.7 12/12/2013 1200   LYMPHSABS 2.1 12/12/2013 1200   MONOABS 0.9 12/12/2013 1200   EOSABS 0.3 12/12/2013 1200   BASOSABS 0.0 12/12/2013 1200    Studies/Results: No results found.  Medications: I have reviewed the patient's  current medications.  Assessment/Plan:  1. Functional deficits secondary to R-BKA and recent left transmet amputation.  2. DVT Prophylaxis/Anticoagulation: Pharmaceutical: Xarelto  3. Pain Management: Continue neurontin for neuropathy  4. Mood: Will have LCSW follow for evaluation and support.  5. Neuropsych: This patient is capable of making decisions on his own behalf.  6. DM type 2: Will monitor with ac/hs checks. Continue lantus 30 units at bedtime with SSI for elevated BS. Titrate lantus for better control. Educate patient on importance of medication compliance.  7. A fib: Will monitor HR every 8 hours. Continue Cardizem and Xarelto.  8. HTN: Monitor BP every 8 hours. D/C HCTZ,reduce lisinopril and Cont Cardizem.  9. ABLA: Will add iron supplement. Recheck in am.  10. Hyponatremia: Due to sepsis resolving. Will recheck in am.  11. Left transmet amputation: Shows signs of ischemia. Will ask surgeon to evaluate prior to suture removal and resume doxycyline. On silvadene and NTG patch per recommendations. 12. Constipation: see Rx 13. Intertrigo between buttocks: see Rx  Cont w/current Rx    Length of stay, days: 5  Walker Kehr , MD 12/14/2013, 9:09 AM

## 2013-12-14 NOTE — Progress Notes (Signed)
Physical Therapy Session Note  Patient Details  Name: Steven [name] MRN: [id number] Date of Birth: [birth year]  Today's Date: 12/14/2013 Session 1: Time: 1004-1100 Time Calculation (min): 56 min Session 2: Time: 1331-1401 Time Calculation (min): 30 min  Short Term Goals: Week 1:     Skilled Therapeutic Interventions/Progress Updates:    Session 1: Pt received seated in w/c w/ NT present applying petroleum jelly to pt's back. Pt managed w/c parts throughout session w/ supervision and min VC's. Pt able to self-propel w/c w/ BUE w/ supervision and no VC's. Worked on car transfers w/ pt w/ RW, close supervision and mod VC's for sequencing. Pt completed multiple squat pivot transfers w/c <> mat table to R and L w/ supervision, no VC's for setup and sequencing. Worked on sit/stands at [employer] table w/ hands on therapist's forearms, in standing pt was stuck in hyperextension on LLE and was unable to independently shift weight forward sufficiently to bend knee. Pt completed x10 RLE ex's seated EOM, sidelying, and supine. Pt ambulated 41' w/ RW w/ MinA and w/c follow. Pt left supine in bed w/ all needs within reach.  Session 2: Pt received supine in bed asleep, easily aroused and agreeable to participate in therapy. Pt w/ squat pivot transfer to w/c w/ supervision, pt manages w/c parts w/ set up and no VC's. Session focused on standing balance and gait work in parallel bars. Pt w/ several sit/stands in parallel bars w/ CGA and min vc's for sequencing, completed hops over cone to increase foot clearance during gait. Pt ambulated 40' w/ RW w/ MinA, noted increased step length vs previous days. Pt limited by fatigue rather than pain. Pt left in w/c w/ all needs within reach.  Therapy Documentation Precautions:  Precautions Precautions: Fall Required Braces or Orthoses:  (ACE wrap RLE) Restrictions Weight Bearing Restrictions: Yes RLE Weight Bearing: Non weight bearing LLE Weight Bearing: Weight bearing as  tolerated Pain session 1: Pain Assessment Pain Assessment: 0-10 Pain Score: 9  Pain Type: Phantom pain Pain Location: Leg Pain Orientation: Right Pain Descriptors / Indicators: Throbbing Pain Frequency: Constant Pain Onset: On-going Patients Stated Pain Goal: 2 Pain Intervention(s): RN made aware Pain Session 2: 8/10 phantom pain in RLE, reports better than in the AM. Pt pre-medicated before session. Locomotion : Ambulation Ambulation/Gait Assistance: 4: Min assist   See FIM for current functional status  Therapy/Group: Individual Therapy  Rada Hay Rada Hay, PT, DPT  12/14/2013, 11:01 AM

## 2013-12-15 ENCOUNTER — Encounter (HOSPITAL_COMMUNITY): Payer: Medicaid Other

## 2013-12-15 ENCOUNTER — Inpatient Hospital Stay (HOSPITAL_COMMUNITY): Payer: Medicaid Other

## 2013-12-15 DIAGNOSIS — I739 Peripheral vascular disease, unspecified: Secondary | ICD-10-CM

## 2013-12-15 DIAGNOSIS — L98499 Non-pressure chronic ulcer of skin of other sites with unspecified severity: Secondary | ICD-10-CM

## 2013-12-15 DIAGNOSIS — S88119A Complete traumatic amputation at level between knee and ankle, unspecified lower leg, initial encounter: Secondary | ICD-10-CM

## 2013-12-15 LAB — GLUCOSE, CAPILLARY
GLUCOSE-CAPILLARY: 147 mg/dL — AB (ref 70–99)
Glucose-Capillary: 121 mg/dL — ABNORMAL HIGH (ref 70–99)
Glucose-Capillary: 168 mg/dL — ABNORMAL HIGH (ref 70–99)
Glucose-Capillary: 156 mg/dL — ABNORMAL HIGH (ref 70–99)

## 2013-12-15 MED ORDER — SENNOSIDES-DOCUSATE SODIUM 8.6-50 MG PO TABS
2.0000 | ORAL_TABLET | Freq: Two times a day (BID) | ORAL | Status: DC
  Administered 2013-12-15 – 2013-12-18 (×6): 2 via ORAL
  Filled 2013-12-15 (×8): qty 2

## 2013-12-15 NOTE — Progress Notes (Signed)
Physical Therapy Session Note  Patient Details  Name: Steven [name] MRN: [id number] Date of Birth: [birth year]  Today's Date: 12/15/2013 Time: 4944-9675 Time Calculation (min): 50 min Session 2: 1346-1430 Time Calculation (min): 44 min  Short Term Goals: Week 1:    STG=LTGs due to ELOS   Skilled Therapeutic Interventions/Progress Updates:    Session 1: Pt received seated in w/c receiving nursing care agreeable to participate in therapy. Session focused on household ambulation and standing balance. Pt propels w/c and manages w/c parts w/ distant supervision and occasional setup. Pt w/ supervision for squat pivot transfers w/c <> mat table, CGA for sit<>stand. Pt ambulated w/ MinA w/ RW 15'x4 to simulate household ambulation, then ambulated w/ RW w/ MinA 15'x2 w/ obstacle negotiation. Pt w/ static standing w/ RW w/ reaching across midline and outside BOS. Towards end of session pt reported pain in R lower leg, w/ point tenderness near veins. RN notified, ended session early. Pt left in w/c w/ all needs within reach.  Session 2: Pt received in bed agreeable to participate in therapy. Session focused on functional household ambulation, standing balance, and sit<>stand transfers. Pt ambulated in ADL apartment w/c to sofa w/ RW w/ MinGuard and occasional MinA. Pt reported arms were very fatigued w/ ambulation due to OT session earlier. Worked on sit <> stand transfer sequencing on mat table. Pt req. Max VC's to sequence first half of transfer (sit to half stand) and second half of transfers (stand to half sit). Pt's sister was present for part of session, attempted to clarify brand/model of pt's electric wheelchair. She could not recall exactly, but said it was a Hover-round style scooter. Will continue to try to clarify pt's equipment needs for discharge planning. Pt left seated in w/c w/ all needs within reach.  Therapy Documentation Precautions:  Precautions Precautions: Fall Required Braces or  Orthoses:  (ACE wrap RLE) Restrictions Weight Bearing Restrictions: Yes RLE Weight Bearing: Non weight bearing LLE Weight Bearing: Weight bearing as tolerated General: Amount of Missed PT Time (min): 10 Minutes Missed Time Reason: Pain Vital Signs:   Pain: Pain Assessment Pain Assessment: 0-10 Pain Score: 8  Pain Type: Acute pain;Phantom pain Pain Location: Leg Pain Orientation: Left;Lower;Right Pain Descriptors / Indicators: Throbbing Pain Frequency: Constant Pain Onset: On-going Pain Intervention(s): RN made aware Mobility:   Locomotion : Ambulation Ambulation/Gait Assistance: 4: Min assist  Trunk/Postural Assessment :    Balance:   Exercises:   Other Treatments:    See FIM for current functional status  Therapy/Group: Individual Therapy  Rada Hay Rada Hay, PT, DPT  12/15/2013, 10:33 AM

## 2013-12-15 NOTE — Progress Notes (Signed)
53 y.o. male with h/o DM type 2--poorly controlled with peripheral neuropathy, HTN, multiple bilateral diabetic foot ulcers with osteomyelitis and infection who had refused BKA in the past but was admitted on 12/03/13 with severe sepsis due to worsening of right foot ulcers with purulent drainage as well as gas gangrene. He was noted to have A fib with RVR and UDS + cocaine. No BB in setting of cocaine and long term anticoagulation recommended by Dr. Stanford Breed. 2D echo with EF 55-65% and no wall abnormality noted. 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 Cardiology recommends ischemic work up once he recovers from surgery. PT evaluation done and patient with heavy reliance on UE. Patient with left foot wound with drainage and Dr. Jess Barters office contacted for input. Sutures to stay in place for a week and silvadene/nitro patches   Subjective/Complaints: Pain controlled. Had a good weekend. No bm? Review of Systems - Negative except fatigue after PT yest  Objective: Vital Signs: Blood pressure 118/59, pulse 74, temperature 99.1 F (37.3 C), temperature source Oral, resp. rate 18, weight 101.969 kg (224 lb 12.8 oz), SpO2 100.00%. No results found. Results for orders placed during the hospital encounter of 12/09/13 (from the past 72 hour(s))  GLUCOSE, CAPILLARY     Status: Abnormal   Collection Time    12/12/13 11:09 AM      Result Value Ref Range   Glucose-Capillary 233 (*) 70 - 99 mg/dL  CBC WITH DIFFERENTIAL     Status: Abnormal   Collection Time    12/12/13 12:00 PM      Result Value Ref Range   WBC 10.4  4.0 - 10.5 K/uL   RBC 3.41 (*) 4.22 - 5.81 MIL/uL   Hemoglobin 9.7 (*) 13.0 - 17.0 g/dL   HCT 30.0 (*) 39.0 - 52.0 %   MCV 88.0  78.0 - 100.0 fL   MCH 28.4  26.0 - 34.0 pg   MCHC 32.3  30.0 - 36.0 g/dL   RDW 14.7  11.5 - 15.5 %   Platelets 401 (*) 150 - 400 K/uL   Neutrophils Relative % 67  43 - 77 %   Neutro Abs 7.0  1.7  - 7.7 K/uL   Lymphocytes Relative 21  12 - 46 %   Lymphs Abs 2.1  0.7 - 4.0 K/uL   Monocytes Relative 9  3 - 12 %   Monocytes Absolute 0.9  0.1 - 1.0 K/uL   Eosinophils Relative 3  0 - 5 %   Eosinophils Absolute 0.3  0.0 - 0.7 K/uL   Basophils Relative 0  0 - 1 %   Basophils Absolute 0.0  0.0 - 0.1 K/uL  URINALYSIS, ROUTINE W REFLEX MICROSCOPIC     Status: Abnormal   Collection Time    12/12/13  2:58 PM      Result Value Ref Range   Color, Urine YELLOW  YELLOW   APPearance CLEAR  CLEAR   Specific Gravity, Urine 1.014  1.005 - 1.030   pH 6.0  5.0 - 8.0   Glucose, UA 100 (*) NEGATIVE mg/dL   Hgb urine dipstick NEGATIVE  NEGATIVE   Bilirubin Urine NEGATIVE  NEGATIVE   Ketones, ur NEGATIVE  NEGATIVE mg/dL   Protein, ur NEGATIVE  NEGATIVE mg/dL   Urobilinogen, UA 0.2  0.0 - 1.0 mg/dL   Nitrite NEGATIVE  NEGATIVE   Leukocytes, UA NEGATIVE  NEGATIVE   Comment: MICROSCOPIC NOT DONE ON URINES  WITH NEGATIVE PROTEIN, BLOOD, LEUKOCYTES, NITRITE, OR GLUCOSE <1000 mg/dL.  URINE CULTURE     Status: None   Collection Time    12/12/13  2:58 PM      Result Value Ref Range   Specimen Description URINE, CLEAN CATCH     Special Requests doxycyline     Culture  Setup Time       Value: 12/12/2013 16:07     Performed at [facility] Count       Value: 6,000 COLONIES/ML     Performed at [facility]   Culture       Value: INSIGNIFICANT GROWTH     Performed at [facility]   Report Status 12/13/2013 FINAL    GLUCOSE, CAPILLARY     Status: Abnormal   Collection Time    12/12/13  4:32 PM      Result Value Ref Range   Glucose-Capillary 211 (*) 70 - 99 mg/dL  GLUCOSE, CAPILLARY     Status: Abnormal   Collection Time    12/12/13  9:43 PM      Result Value Ref Range   Glucose-Capillary 193 (*) 70 - 99 mg/dL  GLUCOSE, CAPILLARY     Status: None   Collection Time    12/13/13  7:36 AM      Result Value Ref Range   Glucose-Capillary 77  70 - 99 mg/dL  GLUCOSE,  CAPILLARY     Status: Abnormal   Collection Time    12/13/13 11:36 AM      Result Value Ref Range   Glucose-Capillary 209 (*) 70 - 99 mg/dL  GLUCOSE, CAPILLARY     Status: Abnormal   Collection Time    12/13/13  4:27 PM      Result Value Ref Range   Glucose-Capillary 176 (*) 70 - 99 mg/dL  GLUCOSE, CAPILLARY     Status: Abnormal   Collection Time    12/13/13  8:50 PM      Result Value Ref Range   Glucose-Capillary 188 (*) 70 - 99 mg/dL  GLUCOSE, CAPILLARY     Status: None   Collection Time    12/14/13  7:25 AM      Result Value Ref Range   Glucose-Capillary 99  70 - 99 mg/dL  GLUCOSE, CAPILLARY     Status: Abnormal   Collection Time    12/14/13 11:19 AM      Result Value Ref Range   Glucose-Capillary 164 (*) 70 - 99 mg/dL  GLUCOSE, CAPILLARY     Status: Abnormal   Collection Time    12/14/13  4:46 PM      Result Value Ref Range   Glucose-Capillary 188 (*) 70 - 99 mg/dL  GLUCOSE, CAPILLARY     Status: Abnormal   Collection Time    12/14/13  8:50 PM      Result Value Ref Range   Glucose-Capillary 189 (*) 70 - 99 mg/dL  GLUCOSE, CAPILLARY     Status: Abnormal   Collection Time    12/15/13  7:19 AM      Result Value Ref Range   Glucose-Capillary 121 (*) 70 - 99 mg/dL   Comment 1 Notify RN        Nursing note and vitals reviewed.  Constitutional: He is oriented to person, place, and time. He appears well-developed and well-nourished.  HENT:  Head: Normocephalic and atraumatic.  Eyes: Conjunctivae are normal. Pupils are equal, round, and reactive to light.  Neck: Normal range of motion. Neck supple.  Cardiovascular: Normal rate and regular rhythm.  No murmur heard.  Respiratory: Effort normal and breath sounds normal. No respiratory distress. He has no wheezes.  GI: Soft. Bowel sounds are normal. He exhibits no distension. There is no tenderness.  Musculoskeletal:  R-BKA with coban compressive dressing. Neurological: He is alert and oriented to person, place, and  time.  Skin: Skin is warm and dry. Sacral decub essentially healed---some superficial epithelial tissues scaling off Motor: 5/5 bilateral deltoid, bicep, tricep, grip  4/5 left hip flexor knee extensor 4/5 right hip flexor, trace left ankle dorsiflexor plantar flexor 0 toe flexion-extension   Assessment/Plan: 1. 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 Physiatrist is providing close team supervision and 24 hour management of active medical problems listed below. Physiatrist and rehab team continue to assess barriers to discharge/monitor patient progress toward functional and medical goals. FIM: FIM - Bathing Bathing Steps Patient Completed: Chest;Right Arm;Left Arm;Abdomen;Front perineal area;Buttocks;Right upper leg;Left upper leg Bathing: 6: More than reasonable amount of time  FIM - Upper Body Dressing/Undressing Upper body dressing/undressing steps patient completed: Thread/unthread right sleeve of pullover shirt/dresss;Thread/unthread left sleeve of pullover shirt/dress;Put head through opening of pull over shirt/dress;Pull shirt over trunk Upper body dressing/undressing: 6: More than reasonable amount of time FIM - Lower Body Dressing/Undressing Lower body dressing/undressing steps patient completed: Thread/unthread right underwear leg;Thread/unthread left underwear leg;Thread/unthread right pants leg;Thread/unthread left pants leg;Pull underwear up/down Lower body dressing/undressing: 5: Supervision: Safety issues/verbal cues  FIM - Toileting Toileting: 0: Activity did not occur  FIM - Air cabin crew Transfers: 0-Activity did not occur  FIM - Control and instrumentation engineer Devices: Arm rests Bed/Chair Transfer: 6: Supine > Sit: No assist;6: Bed > Chair or W/C: No assist  FIM - Locomotion: Wheelchair Locomotion: Wheelchair: 5:  Travels 150 ft or more: maneuvers on rugs and over door sills with supervision, cueing or coaxing FIM - Locomotion: Ambulation Locomotion: Ambulation Assistive Devices: Administrator Ambulation/Gait Assistance: 4: Min assist Locomotion: Ambulation: 2: Travels 50 - 149 ft with minimal assistance (Pt.>75%)  Comprehension Comprehension Mode: Auditory Comprehension: 6-Follows complex conversation/direction: With extra time/assistive device  Expression Expression Mode: Verbal Expression: 7-Expresses complex ideas: With no assist  Social Interaction Social Interaction: 6-Interacts appropriately with others with medication or extra time (anti-anxiety, antidepressant).  Problem Solving Problem Solving: 6-Solves complex problems: With extra time  Memory Memory: 6-More than reasonable amt of time  Problem List and Plan:  1. Functional deficits secondary to R-BKA (6/25)  and recent left transmet amputation.   -remove post-op dressing soon (this week) 2. DVT Prophylaxis/Anticoagulation: Pharmaceutical: Xarelto  3. Pain Management: Continue neurontin for neuropathy  4. Mood: Will have LCSW follow for evaluation and support.  5. Neuropsych: This patient is capable of making decisions on his own behalf.  6. DM type 2: Will monitor with ac/hs checks. Continue lantus 30 units at bedtime with SSI for elevated BS. Titrate lantus for better control. Educate patient on importance of medication compliance.  7. A fib: Will monitor HR every 8 hours. Continue Cardizem and Xarelto.  8. HTN: Monitor BP every 8 hours. D/C HCTZ,reduce lisinopril and  Cont Cardizem.  9. ABLA:   iron supplement.    10. Hyponatremia: Due to sepsis resolving. Will recheck in am.  11. Left transmet amputation:  Doxycyline, silvadene and NTG patch per recommendations. Will order [facility] [place] [facility] sandal  LOS (Days) 6 A FACE TO FACE EVALUATION WAS PERFORMED  Iridian Reader T 12/15/2013, 8:26 AM

## 2013-12-15 NOTE — Progress Notes (Signed)
Occupational Therapy Session Note  Patient Details  Name: Steven [name] MRN: [id number] Date of Birth: 01-Mar-1961  Today's Date: 12/15/2013 Time: 0728-0828 Time Calculation (min): 60 min  Short Term Goals: Week 1:  OT Short Term Goal 1 (Week 1): Short term goals = LTG due to short LOS  Skilled Therapeutic Interventions/Progress Updates: ADL-retraining with focus on use of DME, discharge planning, safety awareness, endurance, and dynamic sitting balance.   Patient received supine in bed, finishing his breakfast using overbed table, unassisted.   Patient able to complete bed mobility and transfer to w/c unassisted and w/o verbal cues for safety during this session.   Patient attempted alternate approach with his w/c mobility to enter bathroom but failed returning to former method although demonstrating his creativity with problem-solvin and his ability to recognize errors and reconsider methods to save time and energy.    Patient proceeded to shower unassisted, requesting assist only to wash his back.   OT educated pt on use of AE (long sponge, provided) and reviewed needed DME s/p discharge.   Pt identified need for tub bench and BSC over toilet after bathing in tub previous weekend.   Patient left in w/c to await physical therapist.    Therapy Documentation Precautions:  Precautions Precautions: Fall Required Braces or Orthoses:  (ACE wrap RLE) Restrictions Weight Bearing Restrictions: Yes RLE Weight Bearing: Non weight bearing LLE Weight Bearing: Weight bearing as tolerated  Vital Signs: Therapy Vitals Temp: 99.1 F (37.3 C) Temp src: Oral Pulse Rate: 74 Resp: 18 BP: 118/59 mmHg Patient Position (if appropriate): Lying Oxygen Therapy SpO2: 100 % O2 Device: None (Room air)  Pain: Pain Assessment Pain Assessment: No/denies pain Pain Score: 8  Pain Type: Acute pain Pain Location: Leg Pain Orientation: Right;Mid Pain Descriptors / Indicators: Throbbing Pain Frequency:  Constant Pain Onset: On-going Pain Intervention(s): Medication (See eMAR);Distraction;Repositioned;Shower  See FIM for current functional status  Therapy/Group: Individual Therapy  Second session: Time: 1132-1205 Time Calculation (min):  33 min  Pain Assessment: 8/10  Skilled Therapeutic Interventions: Therapeutic exercise with focus on enduranc and UE AROM/strength.   Patient completed 20 min using Sci-Fit, level 3 this session which demonstrated improved endurance as he could only tolerate 17:30 min on 7/2.   HR/BP assessed also improved to 113/76 & 102 bpm with mild-mod exertion reported.   Patient returned to his room after session and performed squat pivot transfer from w/c to bed in prep for self-feeding while supine w/HOB elevated.  See FIM for current functional status  Therapy/Group: Individual Therapy  Long Brimage 12/15/2013, 8:46 AM

## 2013-12-16 ENCOUNTER — Inpatient Hospital Stay (HOSPITAL_COMMUNITY): Payer: Medicaid Other

## 2013-12-16 ENCOUNTER — Inpatient Hospital Stay (HOSPITAL_COMMUNITY): Payer: Medicaid Other | Admitting: Rehabilitation

## 2013-12-16 LAB — GLUCOSE, CAPILLARY
GLUCOSE-CAPILLARY: 154 mg/dL — AB (ref 70–99)
Glucose-Capillary: 80 mg/dL (ref 70–99)
Glucose-Capillary: 157 mg/dL — ABNORMAL HIGH (ref 70–99)
Glucose-Capillary: 182 mg/dL — ABNORMAL HIGH (ref 70–99)

## 2013-12-16 NOTE — Progress Notes (Signed)
53 y.o. male with h/o DM type 2--poorly controlled with peripheral neuropathy, HTN, multiple bilateral diabetic foot ulcers with osteomyelitis and infection who had refused BKA in the past but was admitted on 12/03/13 with severe sepsis due to worsening of right foot ulcers with purulent drainage as well as gas gangrene. He was noted to have A fib with RVR and UDS + cocaine. No BB in setting of cocaine and long term anticoagulation recommended by Dr. Stanford Breed. 2D echo with EF 55-65% and no wall abnormality noted. 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 Cardiology recommends ischemic work up once he recovers from surgery. PT evaluation done and patient with heavy reliance on UE. Patient with left foot wound with drainage and Dr. Jess Barters office contacted for input. Sutures to stay in place for a week and silvadene/nitro patches   Subjective/Complaints: Moving quite well with therapies. Needs bm.  Review of Systems -   Objective: Vital Signs: Blood pressure 119/69, pulse 97, temperature 98.8 F (37.1 C), temperature source Oral, resp. rate 18, weight 101.969 kg (224 lb 12.8 oz), SpO2 98.00%. No results found. Results for orders placed during the hospital encounter of 12/09/13 (from the past 72 hour(s))  GLUCOSE, CAPILLARY     Status: Abnormal   Collection Time    12/13/13 11:36 AM      Result Value Ref Range   Glucose-Capillary 209 (*) 70 - 99 mg/dL  GLUCOSE, CAPILLARY     Status: Abnormal   Collection Time    12/13/13  4:27 PM      Result Value Ref Range   Glucose-Capillary 176 (*) 70 - 99 mg/dL  GLUCOSE, CAPILLARY     Status: Abnormal   Collection Time    12/13/13  8:50 PM      Result Value Ref Range   Glucose-Capillary 188 (*) 70 - 99 mg/dL  GLUCOSE, CAPILLARY     Status: None   Collection Time    12/14/13  7:25 AM      Result Value Ref Range   Glucose-Capillary 99  70 - 99 mg/dL  GLUCOSE, CAPILLARY     Status: Abnormal    Collection Time    12/14/13 11:19 AM      Result Value Ref Range   Glucose-Capillary 164 (*) 70 - 99 mg/dL  GLUCOSE, CAPILLARY     Status: Abnormal   Collection Time    12/14/13  4:46 PM      Result Value Ref Range   Glucose-Capillary 188 (*) 70 - 99 mg/dL  GLUCOSE, CAPILLARY     Status: Abnormal   Collection Time    12/14/13  8:50 PM      Result Value Ref Range   Glucose-Capillary 189 (*) 70 - 99 mg/dL  GLUCOSE, CAPILLARY     Status: Abnormal   Collection Time    12/15/13  7:19 AM      Result Value Ref Range   Glucose-Capillary 121 (*) 70 - 99 mg/dL   Comment 1 Notify RN    GLUCOSE, CAPILLARY     Status: Abnormal   Collection Time    12/15/13 11:17 AM      Result Value Ref Range   Glucose-Capillary 147 (*) 70 - 99 mg/dL   Comment 1 Notify RN    GLUCOSE, CAPILLARY     Status: Abnormal   Collection Time    12/15/13  4:38 PM      Result Value Ref Range  Glucose-Capillary 168 (*) 70 - 99 mg/dL  GLUCOSE, CAPILLARY     Status: Abnormal   Collection Time    12/15/13  9:05 PM      Result Value Ref Range   Glucose-Capillary 156 (*) 70 - 99 mg/dL  GLUCOSE, CAPILLARY     Status: None   Collection Time    12/16/13  7:09 AM      Result Value Ref Range   Glucose-Capillary 80  70 - 99 mg/dL   Comment 1 Notify RN        Nursing note and vitals reviewed.  Constitutional: He is oriented to person, place, and time. He appears well-developed and well-nourished.  HENT:  Head: Normocephalic and atraumatic.  Eyes: Conjunctivae are normal. Pupils are equal, round, and reactive to light.  Neck: Normal range of motion. Neck supple.  Cardiovascular: Normal rate and regular rhythm.  No murmur heard.  Respiratory: Effort normal and breath sounds normal. No respiratory distress. He has no wheezes.  GI: Soft. Bowel sounds are normal. He exhibits no distension. There is no tenderness.  Musculoskeletal:  R-BKA with coban compressive dressing. Neurological: He is alert and oriented to  person, place, and time.  Skin: Skin is warm and dry. Sacral decub essentially healed---some superficial epithelial tissues scaling off Motor: 5/5 bilateral deltoid, bicep, tricep, grip  4/5 left hip flexor knee extensor 4/5 right hip flexor, trace left ankle dorsiflexor plantar flexor 0 toe flexion-extension   Assessment/Plan: 1. 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 Physiatrist is providing close team supervision and 24 hour management of active medical problems listed below. Physiatrist and rehab team continue to assess barriers to discharge/monitor patient progress toward functional and medical goals. FIM: FIM - Bathing Bathing Steps Patient Completed: Chest;Right Arm;Left Arm;Abdomen;Front perineal area;Buttocks;Right upper leg;Left upper leg Bathing: 6: Assistive device (Comment)  FIM - Upper Body Dressing/Undressing Upper body dressing/undressing steps patient completed: Thread/unthread right sleeve of pullover shirt/dresss;Thread/unthread left sleeve of pullover shirt/dress;Put head through opening of pull over shirt/dress;Pull shirt over trunk Upper body dressing/undressing: 7: Complete Independence: No helper FIM - Lower Body Dressing/Undressing Lower body dressing/undressing steps patient completed: Thread/unthread right underwear leg;Thread/unthread left underwear leg Lower body dressing/undressing: 4: Min-Patient completed 75 plus % of tasks  FIM - Toileting Toileting steps completed by patient: Adjust clothing prior to toileting;Performs perineal hygiene;Adjust clothing after toileting Toileting: 6: More than reasonable amount of time (lateral leans for hygiene)  FIM - Air cabin crew Transfers Assistive Devices: Bedside commode;Walker Toilet Transfers: 6-To toilet/ BSC;6-From toilet/BSC  FIM - Buyer, retail Devices: Copy: 6: More than reasonable amt of time;6: Sit > Supine: No assist;6: Supine > Sit: No assist;6: Bed > Chair or W/C: No assist;6: Chair or W/C > Bed: No assist  FIM - Locomotion: Wheelchair Locomotion: Wheelchair: 5: Travels 150 ft or more: maneuvers on rugs and over door sills with supervision, cueing or coaxing FIM - Locomotion: Ambulation Locomotion: Ambulation Assistive Devices: Administrator Ambulation/Gait Assistance: 4: Min assist Locomotion: Ambulation: 1: Travels less than 50 ft with minimal assistance (Pt.>75%)  Comprehension Comprehension Mode: Auditory Comprehension: 7-Follows complex conversation/direction: With no assist  Expression Expression Mode: Verbal Expression: 7-Expresses complex ideas: With no assist  Social Interaction Social Interaction: 7-Interacts appropriately with others - No medications needed.  Problem Solving Problem Solving: 6-Solves complex problems: With extra time  Memory Memory: 7-Complete Independence: No  helper  Problem List and Plan:  1. Functional deficits secondary to R-BKA (6/25)  and recent left transmet amputation.   -remove post-op dressing tomorrow 2. DVT Prophylaxis/Anticoagulation: Pharmaceutical: Xarelto  3. Pain Management: Continue neurontin for neuropathy  4. Mood: Will have LCSW follow for evaluation and support.  5. Neuropsych: This patient is capable of making decisions on his own behalf.  6. DM type 2: Will monitor with ac/hs checks. Continue lantus 30 units at bedtime with SSI for elevated BS. Titrating lantus for better control.    7. A fib: Will monitor HR every 8 hours. Continue Cardizem and Xarelto.  8. HTN: Monitor BP every 8 hours. D/C HCTZ,reduce lisinopril and  Cont Cardizem.  9. ABLA:   iron supplement.    10. Hyponatremia: Due to sepsis resolving.   11. Left transmet amputation:  Doxycyline, silvadene and NTG patch per recommendations.   DARCO sandal   LOS (Days)  7 A FACE TO FACE EVALUATION WAS PERFORMED  River Ambrosio T 12/16/2013, 8:20 AM

## 2013-12-16 NOTE — Progress Notes (Signed)
Physical Therapy Session Note  Patient Details  Name: Steven [name] MRN: [id number] Date of Birth: 01-21-1961  Today's Date: 12/16/2013 Time: 1950-9326 Time Calculation (min): 59 min  Short Term Goals: Week 1:    STG=LTGs due to ELOS   Skilled Therapeutic Interventions/Progress Updates:    Pt received seated in w/c agreeable to participate in therapy. Session focused on functional ambulation and BLE strengthening. Pt propelled w/c 150' to rehab gym w/ mod (I), setup w/c for transfer and managed parts w/ mod (I). Completed squat pivot transfers with mod (I) to mat table. Pt mod (I) w/ all bed mobility including rolling, scooting, and supine <> sit. Pt completed BLE ex's in supine and prone for strengthening and ROM. Pt ambulated 25'x2 w/ RW w/ close (s) w/ no LOB or buckling noted. Pt left supine in bed w/ friend present w/ all needs within reach.  Therapy Documentation Precautions:  Precautions Precautions: Fall Required Braces or Orthoses:  (ACE wrap RLE) Restrictions Weight Bearing Restrictions: Yes RLE Weight Bearing: Non weight bearing LLE Weight Bearing: Weight bearing as tolerated Vital Signs: Therapy Vitals Pulse Rate: 98 BP: 105/72 mmHg Pain: Pain Assessment Pain Assessment: 0-10 Pain Score: 8  (Rated 8 but had smile on face and said he felt good) Pain Type: Phantom pain Pain Location: Leg Pain Orientation: Right Pain Descriptors / Indicators: Throbbing Pain Intervention(s):  (Pt pre-medicated) Locomotion : Ambulation Ambulation/Gait Assistance: 5: Supervision   See FIM for current functional status  Therapy/Group: Individual Therapy  Rada Hay 12/16/2013, 11:46 AM

## 2013-12-16 NOTE — Progress Notes (Signed)
Occupational Therapy Session Note  Patient Details  Name: Steven [name] MRN: [id number] Date of Birth: [birth year]  Today's Date: 12/16/2013 Time: 0728-0828 Time Calculation (min): 60 min  Short Term Goals: Week 1:  OT Short Term Goal 1 (Week 1): Short term goals = LTG due to short LOS  Skilled Therapeutic Interventions/Progress Updates: ADL-retraining with focus on discharge planning, use of DME/AE, transfers, functional mobility.   Patient completed bed mobility, transfers, bathing and dressing, with distant supervision and min verbal cues only to initiate tasks and extra time due to patient's preference for thoroughness with skin care and grooming.   Discussed discharge plans, est 12/18/13, and patient reported confidence that he will maintain his prior level of independence at w/c-level using his [facility].   Patient confirms need for tub bench and BSC over toilet for safe transfers to enable modified independent performance of BADL.   Patient able to stand, supported to pull up his underwear and pants this session, without any assistance or evidence of LOB.  Patient confirms that his apartment is level w/o steps and he uses his [employer] to shop locally for food and essentials.   Pt states that his sister who works "3rd shift" remains available as needed for incidental assist with iADL.     Therapy Documentation Precautions:  Precautions Precautions: Fall Required Braces or Orthoses:  (ACE wrap RLE) Restrictions Weight Bearing Restrictions: Yes RLE Weight Bearing: Non weight bearing LLE Weight Bearing: Weight bearing as tolerated  Vital Signs: Therapy Vitals Temp: 98.8 F (37.1 C) Temp src: Oral Pulse Rate: 97 Resp: 18 BP: 119/69 mmHg Patient Position (if appropriate): Lying Oxygen Therapy SpO2: 98 % O2 Device: None (Room air)  Pain: Pain Assessment Pain Assessment: 0-10 Pain Score: 8  Pain Type: Phantom pain Pain Location: Leg Pain Orientation: Right Pain Descriptors /  Indicators: Throbbing Pain Frequency: Intermittent Pain Onset: Awakened from sleep Patients Stated Pain Goal: 6 Pain Intervention(s): Medication (See eMAR)  See FIM for current functional status  Therapy/Group: Individual Therapy  Second session: Time: 1300-1330 Time Calculation (min):  30 min  Pain Assessment: 5/10, RLE  Skilled Therapeutic Interventions: Therapeutic exercises with focus on upper extremity strengthening, seated, using thera-band (level 3, orange).   Patient performed the following 6 UE exercises with demonstration and literature provided for HEP:  Chest press, scapular chest pull, scapular pull downs, bicep curl, tricep extension.    Patient required rest breaks, 2 times during seated routine although demonstrating good effort and attention to task.   See FIM for current functional status  Therapy/Group: Individual Therapy  [place] 12/16/2013, 8:28 AM

## 2013-12-16 NOTE — Progress Notes (Signed)
Physical Therapy Session Note  Patient Details  Name: Steven [name] MRN: [id number] Date of Birth: [birth year]  Today's Date: 12/16/2013 Time: 2248-2500 Time Calculation (min): 42 min  Short Term Goals: Week 1:     Skilled Therapeutic Interventions/Progress Updates:   Pt received lying in bed, agreeable to therapy session.  Skilled session focused on w/c mobility, management of w/c parts, functional transfers to varying heights, negotiating curb step for community use and negotiating w/c up/down ramp for community use.  Performed level transfers at mod I level with pt able to set up w/c and manage parts independently.  S for varying levels of transfers due to w/c moving slightly due to increase pushing with UEs (performed higher surface in therapy gym and low surface in ADL apt to/from couch).  Performed curb with RW at mod A level with verbal and demonstration cues for safety and sequencing.  Ended session with negotiating up/down ramp with w/c x 3 reps.  Pt requires min A for safety when ascending ramp with min/mod cues for propulsion technique with UEs when ascending/descending.  Pt propelled back to room and transferred back to bed at mod I level.  Left pt in bed with all needs in reach.    Therapy Documentation Precautions:  Precautions Precautions: Fall Required Braces or Orthoses:  (ACE wrap RLE) Restrictions Weight Bearing Restrictions: Yes RLE Weight Bearing: Non weight bearing LLE Weight Bearing: Weight bearing as tolerated   Vital Signs: Therapy Vitals Temp: 98.1 F (36.7 C) Temp src: Oral Pulse Rate: 96 Resp: 18 BP: 102/64 mmHg Patient Position (if appropriate): Lying Oxygen Therapy SpO2: 99 % O2 Device: None (Room air) Pain: Pain Assessment Pain Assessment: 0-10 Pain Score: 5  Pain Location: Foot Pain Orientation: Left Pain Descriptors / Indicators: Throbbing   Locomotion : Wheelchair Mobility Distance: 150   See FIM for current functional  status  Therapy/Group: Individual Therapy  Denice Bors 12/16/2013, 4:44 PM

## 2013-12-17 ENCOUNTER — Encounter (HOSPITAL_COMMUNITY): Payer: Medicaid Other

## 2013-12-17 ENCOUNTER — Inpatient Hospital Stay (HOSPITAL_COMMUNITY): Payer: Medicaid Other

## 2013-12-17 DIAGNOSIS — L98499 Non-pressure chronic ulcer of skin of other sites with unspecified severity: Secondary | ICD-10-CM

## 2013-12-17 DIAGNOSIS — S88119A Complete traumatic amputation at level between knee and ankle, unspecified lower leg, initial encounter: Secondary | ICD-10-CM

## 2013-12-17 DIAGNOSIS — I739 Peripheral vascular disease, unspecified: Secondary | ICD-10-CM

## 2013-12-17 LAB — GLUCOSE, CAPILLARY
Glucose-Capillary: 79 mg/dL (ref 70–99)
Glucose-Capillary: 145 mg/dL — ABNORMAL HIGH (ref 70–99)
Glucose-Capillary: 147 mg/dL — ABNORMAL HIGH (ref 70–99)
Glucose-Capillary: 201 mg/dL — ABNORMAL HIGH (ref 70–99)

## 2013-12-17 NOTE — Progress Notes (Signed)
53 y.o. male with h/o DM type 2--poorly controlled with peripheral neuropathy, HTN, multiple bilateral diabetic foot ulcers with osteomyelitis and infection who had refused BKA in the past but was admitted on 12/03/13 with severe sepsis due to worsening of right foot ulcers with purulent drainage as well as gas gangrene. He was noted to have A fib with RVR and UDS + cocaine. No BB in setting of cocaine and long term anticoagulation recommended by Dr. Stanford Breed. 2D echo with EF 55-65% and no wall abnormality noted. 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 Cardiology recommends ischemic work up once he recovers from surgery. PT evaluation done and patient with heavy reliance on UE. Patient with left foot wound with drainage and Dr. Jess Barters office contacted for input. Sutures to stay in place for a week and silvadene/nitro patches   Subjective/Complaints: No new complaints. BM? Review of Systems -   Objective: Vital Signs: Blood pressure 105/66, pulse 75, temperature 98.7 F (37.1 C), temperature source Oral, resp. rate 17, weight 103 kg (227 lb 1.2 oz), SpO2 100.00%. No results found. Results for orders placed during the hospital encounter of 12/09/13 (from the past 72 hour(s))  GLUCOSE, CAPILLARY     Status: Abnormal   Collection Time    12/14/13 11:19 AM      Result Value Ref Range   Glucose-Capillary 164 (*) 70 - 99 mg/dL  GLUCOSE, CAPILLARY     Status: Abnormal   Collection Time    12/14/13  4:46 PM      Result Value Ref Range   Glucose-Capillary 188 (*) 70 - 99 mg/dL  GLUCOSE, CAPILLARY     Status: Abnormal   Collection Time    12/14/13  8:50 PM      Result Value Ref Range   Glucose-Capillary 189 (*) 70 - 99 mg/dL  GLUCOSE, CAPILLARY     Status: Abnormal   Collection Time    12/15/13  7:19 AM      Result Value Ref Range   Glucose-Capillary 121 (*) 70 - 99 mg/dL   Comment 1 Notify RN    GLUCOSE, CAPILLARY     Status:  Abnormal   Collection Time    12/15/13 11:17 AM      Result Value Ref Range   Glucose-Capillary 147 (*) 70 - 99 mg/dL   Comment 1 Notify RN    GLUCOSE, CAPILLARY     Status: Abnormal   Collection Time    12/15/13  4:38 PM      Result Value Ref Range   Glucose-Capillary 168 (*) 70 - 99 mg/dL  GLUCOSE, CAPILLARY     Status: Abnormal   Collection Time    12/15/13  9:05 PM      Result Value Ref Range   Glucose-Capillary 156 (*) 70 - 99 mg/dL  GLUCOSE, CAPILLARY     Status: None   Collection Time    12/16/13  7:09 AM      Result Value Ref Range   Glucose-Capillary 80  70 - 99 mg/dL   Comment 1 Notify RN    GLUCOSE, CAPILLARY     Status: Abnormal   Collection Time    12/16/13 11:13 AM      Result Value Ref Range   Glucose-Capillary 157 (*) 70 - 99 mg/dL   Comment 1 Notify RN    GLUCOSE, CAPILLARY     Status: Abnormal   Collection Time    12/16/13  4:35  PM      Result Value Ref Range   Glucose-Capillary 154 (*) 70 - 99 mg/dL  GLUCOSE, CAPILLARY     Status: Abnormal   Collection Time    12/16/13  8:52 PM      Result Value Ref Range   Glucose-Capillary 182 (*) 70 - 99 mg/dL  GLUCOSE, CAPILLARY     Status: None   Collection Time    12/17/13  7:20 AM      Result Value Ref Range   Glucose-Capillary 79  70 - 99 mg/dL   Comment 1 Notify RN        Nursing note and vitals reviewed.  Constitutional: He is oriented to person, place, and time. He appears well-developed and well-nourished.  HENT:  Head: Normocephalic and atraumatic.  Eyes: Conjunctivae are normal. Pupils are equal, round, and reactive to light.  Neck: Normal range of motion. Neck supple.  Cardiovascular: Normal rate and regular rhythm.  No murmur heard.  Respiratory: Effort normal and breath sounds normal. No respiratory distress. He has no wheezes.  GI: Soft. Bowel sounds are normal. He exhibits no distension. There is no tenderness.  Musculoskeletal:  R-BKA incision intact with mild amount of sero-sang  discharge. Appropriately tender, swollen stump Left foot with mild amount of necrotic debris at wound. Otherwise intact.  -scaling in perineal area--old healing rash  Neurological: He is alert and oriented to person, place, and time.  Skin: Skin is warm and dry. Sacral decub essentially healed---some superficial epithelial tissues scaling off Motor: 5/5 bilateral deltoid, bicep, tricep, grip  4/5 left hip flexor knee extensor 4/5 right hip flexor, trace left ankle dorsiflexor plantar flexor 0 toe flexion-extension   Assessment/Plan: 1. 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 Physiatrist is providing close team supervision and 24 hour management of active medical problems listed below. Physiatrist and rehab team continue to assess barriers to discharge/monitor patient progress toward functional and medical goals.  Doing quite well. Ready for dc tomorrow. No complaints.  FIM: FIM - Bathing Bathing Steps Patient Completed: Chest;Right Arm;Left Arm;Abdomen;Front perineal area;Buttocks;Right upper leg;Left upper leg Bathing: 6: Assistive device (Comment)  FIM - Upper Body Dressing/Undressing Upper body dressing/undressing steps patient completed: Thread/unthread right sleeve of pullover shirt/dresss;Thread/unthread left sleeve of pullover shirt/dress;Put head through opening of pull over shirt/dress;Pull shirt over trunk Upper body dressing/undressing: 7: Complete Independence: No helper FIM - Lower Body Dressing/Undressing Lower body dressing/undressing steps patient completed: Thread/unthread right underwear leg;Thread/unthread left underwear leg Lower body dressing/undressing: 4: Min-Patient completed 75 plus % of tasks  FIM - Toileting Toileting steps completed by patient: Adjust clothing prior to toileting;Performs perineal hygiene;Adjust clothing after  toileting Toileting: 6: More than reasonable amount of time (lateral leans for hygiene)  FIM - Air cabin crew Transfers Assistive Devices: Bedside commode;Walker Toilet Transfers: 6-To toilet/ BSC;6-From toilet/BSC  FIM - Control and instrumentation engineer Devices: Copy: 6: Supine > Sit: No assist;6: Sit > Supine: No assist;7: Bed > Chair or W/C: No assist;6: Chair or W/C > Bed: No assist  FIM - Locomotion: Wheelchair Distance: 150 Locomotion: Wheelchair: 6: Travels 150 ft or more, turns around, maneuvers to table, bed or toilet, negotiates 3% grade: maneuvers on rugs and over door sills independently FIM - Locomotion: Ambulation Locomotion: Ambulation Assistive Devices: Administrator Ambulation/Gait Assistance: 5: Supervision Locomotion: Ambulation: 1: Travels less than 50 ft with supervision/safety issues  Comprehension  Comprehension Mode: Auditory Comprehension: 6-Follows complex conversation/direction: With extra time/assistive device  Expression Expression Mode: Verbal Expression: 6-Expresses complex ideas: With extra time/assistive device  Social Interaction Social Interaction: 6-Interacts appropriately with others with medication or extra time (anti-anxiety, antidepressant).  Problem Solving Problem Solving: 6-Solves complex problems: With extra time  Memory Memory: 6-More than reasonable amt of time  Problem List and Plan:  1. Functional deficits secondary to R-BKA (6/25)  and recent left transmet amputation.   -remove post-op dressing tomorrow 2. DVT Prophylaxis/Anticoagulation: Pharmaceutical: Xarelto  3. Pain Management: Continue neurontin for neuropathy  4. Mood: Will have LCSW follow for evaluation and support.  5. Neuropsych: This patient is capable of making decisions on his own behalf.  6. DM type 2: Will monitor with ac/hs checks. Continue lantus 30 units at bedtime with SSI for elevated BS. Titrating lantus for  better control.    7. A fib: Will monitor HR every 8 hours. Continue Cardizem and Xarelto.  8. HTN: Monitor BP every 8 hours. D/C HCTZ,reduce lisinopril and  Cont Cardizem.  9. ABLA:   iron supplement.    10. Hyponatremia: Due to sepsis resolving.   11. Wound care:    Left transmet amputation:  Continue  Doxycyline, nitropatch, darco sandle  -clean foot with NS and paint wound with silvadene, cover with dry gauze/kerlix---change daily   Right BKA: apply xeroform gauze to incision, cover with 4x4's, kerlix, and ACE wrap---change daily        LOS (Days) 8 A FACE TO FACE EVALUATION WAS PERFORMED  Hilario Robarts T 12/17/2013, 8:30 AM

## 2013-12-17 NOTE — Progress Notes (Signed)
Occupational Therapy Discharge Summary  Patient Details  Name: Steven [name] MRN: [id number] Date of Birth: 1961/05/08  Today's Date: 12/17/2013  Patient has met 8 of 8 long term goals due to improved activity tolerance, improved balance and ability to compensate for deficits.  Patient to discharge at overall Modified Independent at w/c- level.  Patient's care partner is independent to provide the necessary physical assistance at discharge reduce risk of fall or injury during performance of iADL.    Reasons goals not met: n/a  Recommendation:  Patient will benefit from ongoing skilled PT services in home health setting to continue to advance functional skills in the area of ambulation and LE strengthening.  Equipment: BSC, tub transfer bench  Reasons for discharge: discharge from hospital  Patient/family agrees with progress made and goals achieved: Yes  OT Discharge Precautions/Restrictions  Precautions Precautions: Fall Required Braces or Orthoses:  (ACE wrap R residual limb) Restrictions Weight Bearing Restrictions: Yes RLE Weight Bearing: Non weight bearing LLE Weight Bearing: Weight bearing as tolerated   Pain Pain Assessment Pain Assessment: No/denies pain Pain Score: 5  Faces Pain Scale: No hurt Pain Type: Surgical pain Pain Location: Foot Pain Orientation: Left Pain Descriptors / Indicators: Throbbing Patients Stated Pain Goal: 0 Pain Intervention(s):  (Pt pre-medicated)   Vision/Perception  Vision- History Baseline Vision/History: Wears glasses Wears Glasses: At all times Patient Visual Report: No change from baseline Vision- Assessment Vision Assessment?: No apparent visual deficits  Cognition Overall Cognitive Status: Within Functional Limits for tasks assessed Arousal/Alertness: Awake/alert Orientation Level: Oriented X4 Sensation Sensation Light Touch: Impaired Detail Light Touch Impaired Details: Impaired RLE;Impaired LLE Hot/Cold: Appears  Intact Proprioception: Appears Intact Coordination Gross Motor Movements are Fluid and Coordinated: Yes Fine Motor Movements are Fluid and Coordinated: Yes Motor  Motor Motor: Within Functional Limits Mobility     Trunk/Postural Assessment  Cervical Assessment Cervical Assessment: Within Functional Limits Thoracic Assessment Thoracic Assessment: Within Functional Limits Lumbar Assessment Lumbar Assessment: Within Functional Limits Postural Control Postural Control: Within Functional Limits  Balance Static Sitting Balance Static Sitting - Balance Support: No upper extremity supported Static Sitting - Level of Assistance: 7: Independent Dynamic Sitting Balance Dynamic Sitting - Balance Support: Right upper extremity supported Dynamic Sitting - Level of Assistance: 7: Independent Extremity/Trunk Assessment RUE Assessment RUE Assessment: Within Functional Limits LUE Assessment LUE Assessment: Within Functional Limits  See FIM for current functional status  Leroy Libman 12/17/2013, 10:58 AM

## 2013-12-17 NOTE — Progress Notes (Signed)
Occupational Therapy Session Note  Patient Details  Name: Steven [name] MRN: [id number] Date of Birth: 03/17/1961  Session 1 Today's Date: 12/17/2013 Time: 1000-1100 Time Calculation (min): 60 min  Short Term Goals: Week 1:  OT Short Term Goal 1 (Week 1): Short term goals = LTG due to short LOS  Skilled Therapeutic Interventions/Progress Updates:    Pt sitting EOB on arrival with plastic bag on RLE in preparation for shower.  Pt requested another bag for Lt foot.  Pt completed bathing at shower level and dressing with sit<>stand from w/c with RW.  Pt completed all tasks at mod I with no unsafe behaviors noted.  Focus on activity tolerance, safety awareness, transfers, sit<>stand, and standing balance.  Therapy Documentation Precautions:  Precautions Precautions: Fall Required Braces or Orthoses:  (ACE wrap R residual limb) Restrictions Weight Bearing Restrictions: Yes RLE Weight Bearing: Non weight bearing LLE Weight Bearing: Weight bearing as tolerated   Pain: Pain Assessment Pain Assessment: No/denies pain Pain Score: 5  Faces Pain Scale: No hurt Pain Type: Surgical pain Pain Location: Foot Pain Orientation: Left Pain Descriptors / Indicators: Throbbing Patients Stated Pain Goal: 0 Pain Intervention(s):  (Pt pre-medicated)  See FIM for current functional status  Therapy/Group: Individual Therapy  Session 2 Time: 7014-1030 Pt denied pain Individual Therapy  Pt resting in bed upon arrival and agreeable to participating in therapy.  Pt transferred to w/c at mod I level and rolled to ADL apartment to practice tub bench transfers (mod I). Pt transitioned to therapy gym and engaged in BUE therex on UE ergomenter (level 13 3 min X 2 with 1 min rest break).  Pt rolled back to room and returned to room.  Focus on transfers, UE strengthening, activity tolerance, safety awareness, and continued discharge planning.  Steven [name] [employer] 12/17/2013, 10:51 AM

## 2013-12-17 NOTE — Patient Care Conference (Addendum)
Inpatient RehabilitationTeam Conference and Plan of Care Update Date: 12/16/2013   Time: 2:43 PM    Patient Name: Steven [name]      Medical Record Number: [id number]  Date of Birth: 08-29-1960 Sex: Male         Room/Bed: 4M05C/4M05C-01 Payor Info: Payor: MEDICAID [place] / Plan: MEDICAID [place] ACCESS / Product Type: *No Product type* /    Admitting Diagnosis: R BKA  Admit Date/Time:  12/09/2013  5:01 PM Admission Comments: No comment available   Primary Diagnosis:  Unilateral complete BKA Principal Problem: Unilateral complete BKA  Patient Active Problem List   Diagnosis Date Noted  . Unilateral complete BKA 12/10/2013  . Osteomyelitis with abscess of left 1st and 2nd toes--s/p transmet amputation. 12/10/2013  . Cocaine abuse 12/05/2013  . Tobacco abuse 12/04/2013  . History of cocaine use   . Peripheral vascular disease   . Atrial fibrillation with RVR 12/03/2013  . Severe sepsis 12/03/2013  . Osteomyelitis of ankle or foot, right, acute 12/03/2013  . Osteomyelitis of ankle or foot, acute 11/21/2013  . Gangrene of foot 11/21/2013  . 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 Personal history of colonic adenomas 05/29/2013  . Diabetic retinopathy 12/25/2012  . HLD (hyperlipidemia) 12/08/2012  . Osteomyelitis of foot 08/30/2012  . Peripheral neuropathy 11/19/2011  . ETOH abuse 03/29/2011  . HTN (hypertension) 12/22/2010  . ONYCHOMYCOSIS, TOENAILS 07/18/2006  . Diabetes mellitus type II, uncontrolled 07/18/2006  . VENOUS INSUFFICIENCY 07/18/2006  . CALLUSES, FEET, BILATERAL 07/18/2006    Expected Discharge Date: Expected Discharge Date: 12/18/13  Team Members Present: Physician leading conference: Dr. Alger [name] Social Worker Present: Steven Pall, LCSW Nurse Present: Steven Cousin, RN PT Present: Other (comment) Steven [name], PT and Steven [name], PT) OT Present: Steven [name], OT;Steven [name], OT SLP Present: Steven [name], SLP Other (Discipline and Name): Steven Baxter, RN PPS Coordinator present : Steven Nakayama, RN, CRRN;Steven [name], PT     Current Status/Progress Goal Weekly Team Focus  Medical   bka, remove dressing tomorrow. left foot with wound care  finalize medical issues for dc. improve activity tolerance  pain control, wound care mgt   Bowel/Bladder   Continent of bowel and bladder. LBM: 12/15/2013  Patient to remain continent og bowel and bladder  Monitor for constipation/diarrhea   Swallow/Nutrition/ Hydration             ADL's   mod I overall  mod I overall  safety awareness, activity tolerance, discharge planning   Mobility   mod (I) bed mobility/transfers, (s) short ambulation  mod (I)  standing balance, ambulation   Communication             Safety/Cognition/ Behavioral Observations            Pain   Oxy IR 5-15mg  q 3 hrs prn. Last dose at 2355 for phantom pain  Pain managed at 6 or less  Assess and monitor pain and provide appropriate intervention   Skin   L. foot wound (1st, 2nd & 3rd digits) amputated. Dressing intact, R BKA compressing dressing in place  Wound free of infection; no new breakdown  Educate patient and family on wound care, the importance of dressing change and skin care    Rehab Goals Patient on target to meet rehab goals: Yes *See Care Plan and progress notes for long and short-term goals.  Barriers to Discharge: wound care/ed    Possible Resolutions to Barriers:  edcuation    Discharge Planning/Teaching Needs:  home alone  with intermittent assist of his sister      Team Discussion:  Has made very good progress and reaching mod i goals.  No concerns.  Will need HHRN for skin/ dsg changes.  Revisions to Treatment Plan:  None   Continued Need for Acute Rehabilitation Level of Care: The patient requires daily medical management by a physician with specialized training in physical medicine and rehabilitation for the following conditions: Daily direction of a multidisciplinary physical  rehabilitation program to ensure safe treatment while eliciting the highest outcome that is of practical value to the patient.: Yes Daily medical management of patient stability for increased activity during participation in an intensive rehabilitation regime.: Yes Daily analysis of laboratory values and/or radiology reports with any subsequent need for medication adjustment of medical intervention for : Neurological problems;Post surgical problems  Steven [name] 12/17/2013, 4:41 PM

## 2013-12-17 NOTE — Progress Notes (Addendum)
Physical Therapy Discharge Summary  Patient Details  Name: SPERO GUNNELS MRN: [id number] Date of Birth: [birth year]  Today's Date: 12/17/2013 Time: 6948-5462 Time Calculation (min): 60 min  Patient has met 8 of 8 long term goals due to improved activity tolerance, improved balance, increased strength, decreased pain and ability to compensate for deficits.  Patient to discharge at a wheelchair level Modified Independent with mod (I) for short distance ambulation in home.  At home pt has access to [phone] and electric scooter. Recommend pt use electric scooter for majority of functional mobility, with use of short distance ambulation only in areas of home environment where scooter will not fit. Pt has demonstrated ability to setup w/c and complete safe transfer bed <> w/c w/ modified independence, should carry over to use of electric scooter at home.   Reasons goals not met: N/A  Recommendation:  Patient will benefit from ongoing skilled PT services in outpatient setting once residual limb is ready for prosthetic fitting to continue to advance safe functional mobility and minimize fall risk.  Equipment: No equipment provided  Reasons for discharge: treatment goals met  Patient/family agrees with progress made and goals achieved: Yes  Skilled Tx Provided: Session 1: Pt received supine in bed. Pt w/ mod (I) for all transfers bed <> w/c w/ occasional supervision for steady assist on w/c. Pt is safe to discharge home at this level due to use of electric wheelchair at home that will not require steadying assist from helper. Pt mod (I) w/ wheelchair setup for transfers. Pt w/ supervision for car transfer, able to direct PT in setting up w/c for transfers from car. Pt will be using electric scooter at discharge for functional mobility. Pt ascended/descended 3 stairs w/ MinA for foot placement and safety. Pt propelled w/c back to room w/ mod (I).  Session 2: Pt received supine in bed, agreeable to  participate in therapy session. Pt ambulated 50'x2 w/ RW with seated rest break in between. Pt is mod (I) for shorter distances (10-30 feet), but requires supervision for distances longer due to BUE/LLE fatigue. Pt left supine in bed w/ all needs within reach.  PT Discharge Precautions/Restrictions Precautions Precautions: Fall Required Braces or Orthoses:  (ACE wrap R residual limb) Restrictions Weight Bearing Restrictions: Yes RLE Weight Bearing: Non weight bearing LLE Weight Bearing: Weight bearing as tolerated Vital Signs Therapy Vitals Temp: 98.7 F (37.1 C) Temp src: Oral Pulse Rate: 75 Resp: 17 BP: 105/66 mmHg Patient Position (if appropriate): Lying Oxygen Therapy SpO2: 100 % O2 Device: None (Room air) Pain Pain Assessment Pain Assessment: 0-10 Pain Score: 5  Faces Pain Scale: No hurt Pain Type: Surgical pain Pain Location: Foot Pain Orientation: Left Pain Descriptors / Indicators: Throbbing Pain Frequency: Intermittent Pain Onset: Gradual Patients Stated Pain Goal: 0 Pain Intervention(s):  (Pt pre-medicated) Cognition Overall Cognitive Status: Within Functional Limits for tasks assessed Arousal/Alertness: Awake/alert Orientation Level: Oriented X4 Sensation Sensation Light Touch: Appears Intact Light Touch Impaired Details: Impaired RLE;Impaired LLE Hot/Cold: Appears Intact Proprioception: Appears Intact Coordination Gross Motor Movements are Fluid and Coordinated: Yes Fine Motor Movements are Fluid and Coordinated: Yes Motor  Motor Motor: Within Functional Limits  Mobility Bed Mobility Bed Mobility: Supine to Sit;Sit to Supine Supine to Sit: 7: Independent Sit to Supine: 7: Independent Transfers Transfers: Yes Squat Pivot Transfers: 5: Supervision Squat Pivot Transfer Details (indicate cue type and reason): Occasional steadying (a) at w/c to prevent slipping Locomotion  Ambulation Ambulation: Yes Ambulation/Gait Assistance: 5:  Supervision Ambulation  Distance (Feet): 50 Feet Assistive device: Rolling walker Ambulation/Gait Assistance Details: Hop to gait w/ LLE Stairs / Additional Locomotion Stairs: Yes Stairs Assistance: 3: Mod assist Stairs Assistance Details: Manual facilitation for placement;Verbal cues for precautions/safety Stairs Assistance Details (indicate cue type and reason): VC's for foot/hand placement Stair Management Technique: Two rails Number of Stairs: 3 Ramp: 6: Modified independent Water quality scientist) Wheelchair Mobility Wheelchair Mobility: Yes Wheelchair Assistance: 6: Modified independent (Device/Increase time) Environmental health practitioner: Both upper extremities Wheelchair Parts Management: Independent Distance: 150  Trunk/Postural Assessment  Cervical Assessment Cervical Assessment: Within Functional Limits Thoracic Assessment Thoracic Assessment: Within Functional Limits Lumbar Assessment Lumbar Assessment: Within Functional Limits Postural Control Postural Control: Within Functional Limits  Balance Balance Balance Assessed: Yes Static Sitting Balance Static Sitting - Balance Support: No upper extremity supported Static Sitting - Level of Assistance: 7: Independent Dynamic Sitting Balance Dynamic Sitting - Balance Support: Right upper extremity supported Dynamic Sitting - Level of Assistance: 7: Independent Static Standing Balance Static Standing - Balance Support: Bilateral upper extremity supported Static Standing - Level of Assistance: 5: Stand by assistance Extremity Assessment  RUE Assessment RUE Assessment: Within Functional Limits LUE Assessment LUE Assessment: Within Functional Limits RLE PROM (degrees) RLE Overall PROM Comments: knee extension/flexion WFL RLE Strength RLE Overall Strength Comments: hip flexion 5/5, knee flexion 4+/5, knee ext. limited 2/2 pain LLE Strength LLE Overall Strength Comments: Grossly 4+/5, DF/PF NT 2/2 pain  See FIM for current functional  status  Rada Hay Rada Hay, PT, DPT  12/17/2013, 9:33 AM

## 2013-12-18 ENCOUNTER — Inpatient Hospital Stay (HOSPITAL_COMMUNITY): Payer: Medicaid Other | Admitting: Speech Pathology

## 2013-12-18 DIAGNOSIS — S88119A Complete traumatic amputation at level between knee and ankle, unspecified lower leg, initial encounter: Secondary | ICD-10-CM

## 2013-12-18 LAB — GLUCOSE, CAPILLARY
Glucose-Capillary: 82 mg/dL (ref 70–99)
Glucose-Capillary: 172 mg/dL — ABNORMAL HIGH (ref 70–99)

## 2013-12-18 MED ORDER — THIAMINE HCL 100 MG PO TABS: 100.0000 mg | ORAL_TABLET | Freq: Every day | ORAL | Status: DC

## 2013-12-18 MED ORDER — DOXYCYCLINE HYCLATE 100 MG PO CAPS: 100.0000 mg | ORAL_CAPSULE | Freq: Two times a day (BID) | ORAL | Status: DC

## 2013-12-18 MED ORDER — NITROGLYCERIN 0.1 MG/HR TD PT24: MEDICATED_PATCH | TRANSDERMAL | Status: DC

## 2013-12-18 MED ORDER — RIVAROXABAN 20 MG PO TABS: 20.0000 mg | ORAL_TABLET | Freq: Every day | ORAL | Status: DC

## 2013-12-18 MED ORDER — PANTOPRAZOLE SODIUM 40 MG PO TBEC: 40.0000 mg | DELAYED_RELEASE_TABLET | Freq: Every day | ORAL | Status: DC

## 2013-12-18 MED ORDER — ATORVASTATIN CALCIUM 40 MG PO TABS: 40.0000 mg | ORAL_TABLET | Freq: Every day | ORAL | Status: DC

## 2013-12-18 MED ORDER — OXYCODONE HCL 15 MG PO TABS: 15.0000 mg | ORAL_TABLET | Freq: Four times a day (QID) | ORAL | Status: DC | PRN

## 2013-12-18 MED ORDER — SILVER SULFADIAZINE 1 % EX CREA: TOPICAL_CREAM | Freq: Every day | CUTANEOUS | Status: DC

## 2013-12-18 MED ORDER — INSULIN GLARGINE 100 UNIT/ML ~~LOC~~ SOLN: 30.0000 [IU] | Freq: Every day | SUBCUTANEOUS | Status: DC

## 2013-12-18 MED ORDER — DILTIAZEM HCL ER COATED BEADS 120 MG PO CP24: 120.0000 mg | ORAL_CAPSULE | Freq: Every day | ORAL | Status: DC

## 2013-12-18 MED ORDER — LISINOPRIL 10 MG PO TABS: 10.0000 mg | ORAL_TABLET | Freq: Every day | ORAL | Status: DC

## 2013-12-18 MED ORDER — SENNOSIDES-DOCUSATE SODIUM 8.6-50 MG PO TABS: 2.0000 | ORAL_TABLET | Freq: Two times a day (BID) | ORAL | Status: DC

## 2013-12-18 MED ORDER — KETOCONAZOLE 2 % EX CREA: TOPICAL_CREAM | Freq: Every day | CUTANEOUS | Status: DC

## 2013-12-18 MED ORDER — NICOTINE 14 MG/24HR TD PT24: 14.0000 mg | MEDICATED_PATCH | Freq: Every day | TRANSDERMAL | Status: DC

## 2013-12-18 MED ORDER — FOLIC ACID 1 MG PO TABS: 1.0000 mg | ORAL_TABLET | Freq: Every day | ORAL | Status: DC

## 2013-12-18 NOTE — Progress Notes (Signed)
53 y.o. male with h/o DM type 2--poorly controlled with peripheral neuropathy, HTN, multiple bilateral diabetic foot ulcers with osteomyelitis and infection who had refused BKA in the past but was admitted on 12/03/13 with severe sepsis due to worsening of right foot ulcers with purulent drainage as well as gas gangrene. He was noted to have A fib with RVR and UDS + cocaine. No BB in setting of cocaine and long term anticoagulation recommended by Dr. Stanford Breed. 2D echo with EF 55-65% and no wall abnormality noted. 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 Cardiology recommends ischemic work up once he recovers from surgery. PT evaluation done and patient with heavy reliance on UE. Patient with left foot wound with drainage and Dr. Jess Barters office contacted for input. Sutures to stay in place for a week and silvadene/nitro patches   Subjective/Complaints: Excited to go home. No new complaints. Had a good day Review of Systems -   Objective: Vital Signs: Blood pressure 116/60, pulse 73, temperature 98.2 F (36.8 C), temperature source Oral, resp. rate 19, weight 103 kg (227 lb 1.2 oz), SpO2 99.00%. No results found. Results for orders placed during the hospital encounter of 12/09/13 (from the past 72 hour(s))  GLUCOSE, CAPILLARY     Status: Abnormal   Collection Time    12/15/13 11:17 AM      Result Value Ref Range   Glucose-Capillary 147 (*) 70 - 99 mg/dL   Comment 1 Notify RN    GLUCOSE, CAPILLARY     Status: Abnormal   Collection Time    12/15/13  4:38 PM      Result Value Ref Range   Glucose-Capillary 168 (*) 70 - 99 mg/dL  GLUCOSE, CAPILLARY     Status: Abnormal   Collection Time    12/15/13  9:05 PM      Result Value Ref Range   Glucose-Capillary 156 (*) 70 - 99 mg/dL  GLUCOSE, CAPILLARY     Status: None   Collection Time    12/16/13  7:09 AM      Result Value Ref Range   Glucose-Capillary 80  70 - 99 mg/dL   Comment 1  Notify RN    GLUCOSE, CAPILLARY     Status: Abnormal   Collection Time    12/16/13 11:13 AM      Result Value Ref Range   Glucose-Capillary 157 (*) 70 - 99 mg/dL   Comment 1 Notify RN    GLUCOSE, CAPILLARY     Status: Abnormal   Collection Time    12/16/13  4:35 PM      Result Value Ref Range   Glucose-Capillary 154 (*) 70 - 99 mg/dL  GLUCOSE, CAPILLARY     Status: Abnormal   Collection Time    12/16/13  8:52 PM      Result Value Ref Range   Glucose-Capillary 182 (*) 70 - 99 mg/dL  GLUCOSE, CAPILLARY     Status: None   Collection Time    12/17/13  7:20 AM      Result Value Ref Range   Glucose-Capillary 79  70 - 99 mg/dL   Comment 1 Notify RN    GLUCOSE, CAPILLARY     Status: Abnormal   Collection Time    12/17/13 11:10 AM      Result Value Ref Range   Glucose-Capillary 145 (*) 70 - 99 mg/dL   Comment 1 Notify RN    GLUCOSE, CAPILLARY  Status: Abnormal   Collection Time    12/17/13  4:39 PM      Result Value Ref Range   Glucose-Capillary 147 (*) 70 - 99 mg/dL  GLUCOSE, CAPILLARY     Status: Abnormal   Collection Time    12/17/13  9:11 PM      Result Value Ref Range   Glucose-Capillary 201 (*) 70 - 99 mg/dL  GLUCOSE, CAPILLARY     Status: None   Collection Time    12/18/13  7:17 AM      Result Value Ref Range   Glucose-Capillary 82  70 - 99 mg/dL   Comment 1 Notify RN        Nursing note and vitals reviewed.  Constitutional: He is oriented to person, place, and time. He appears well-developed and well-nourished.  HENT:  Head: Normocephalic and atraumatic.  Eyes: Conjunctivae are normal. Pupils are equal, round, and reactive to light.  Neck: Normal range of motion. Neck supple.  Cardiovascular: Normal rate and regular rhythm.  No murmur heard.  Respiratory: Effort normal and breath sounds normal. No respiratory distress. He has no wheezes.  GI: Soft. Bowel sounds are normal. He exhibits no distension. There is no tenderness.  Musculoskeletal:  R-BKA incision  intact with mild amount of sero-sang discharge. Appropriately tender, swollen stump Left foot with mild amount of necrotic debris at wound. Otherwise intact.  -scaling in perineal area--old healing rash  Neurological: He is alert and oriented to person, place, and time.  Skin: Skin is warm and dry. Sacral decub essentially healed---some superficial epithelial tissues scaling off Motor: 5/5 bilateral deltoid, bicep, tricep, grip  4/5 left hip flexor knee extensor 4/5 right hip flexor, trace left ankle dorsiflexor plantar flexor 0 toe flexion-extension   Assessment/Plan: 1. 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 Physiatrist is providing close team supervision and 24 hour management of active medical problems listed below. Physiatrist and rehab team continue to assess barriers to discharge/monitor patient progress toward functional and medical goals.  Dc today. Follow up with me in a few weeks for prosthetics/rehab evaluation.  FIM: FIM - Bathing Bathing Steps Patient Completed: Chest;Right Arm;Left Arm;Abdomen;Front perineal area;Buttocks;Right upper leg;Left upper leg Bathing: 6: Assistive device (Comment)  FIM - Upper Body Dressing/Undressing Upper body dressing/undressing steps patient completed: Thread/unthread right sleeve of pullover shirt/dresss;Thread/unthread left sleeve of pullover shirt/dress;Put head through opening of pull over shirt/dress;Pull shirt over trunk Upper body dressing/undressing: 7: Complete Independence: No helper FIM - Lower Body Dressing/Undressing Lower body dressing/undressing steps patient completed: Thread/unthread right underwear leg;Thread/unthread left underwear leg Lower body dressing/undressing: 6: Assistive device (Comment)  FIM - Toileting Toileting steps completed by patient: Adjust clothing prior to toileting;Performs  perineal hygiene;Adjust clothing after toileting Toileting: 6: Assistive device: No helper  FIM - Radio producer Devices: Nurse, learning disability Transfers: 6-To toilet/ BSC;6-From toilet/BSC  FIM - Control and instrumentation engineer Devices: Arm rests Bed/Chair Transfer: 7: Supine > Sit: No assist;7: Sit > Supine: No assist;6: Bed > Chair or W/C: No assist;6: Chair or W/C > Bed: No assist  FIM - Locomotion: Wheelchair Distance: 150 Locomotion: Wheelchair: 6: Travels 150 ft or more, turns around, maneuvers to table, bed or toilet, negotiates 3% grade: maneuvers on rugs and over door sills independently FIM - Locomotion: Ambulation Locomotion: Ambulation Assistive Devices: Administrator Ambulation/Gait Assistance: 5: Supervision Locomotion: Ambulation: 2: Travels 50 -  149 ft with supervision/safety issues  Comprehension Comprehension Mode: Auditory Comprehension: 6-Follows complex conversation/direction: With extra time/assistive device  Expression Expression Mode: Verbal Expression: 6-Expresses complex ideas: With extra time/assistive device  Social Interaction Social Interaction: 6-Interacts appropriately with others with medication or extra time (anti-anxiety, antidepressant).  Problem Solving Problem Solving: 6-Solves complex problems: With extra time  Memory Memory: 6-More than reasonable amt of time  Problem List and Plan:  1. Functional deficits secondary to R-BKA (6/25)  and recent left transmet amputation.   -outpt follow up 2. DVT Prophylaxis/Anticoagulation: Pharmaceutical: Xarelto  3. Pain Management: Continue neurontin for neuropathy  4. Mood: Will have LCSW follow for evaluation and support.  5. Neuropsych: This patient is capable of making decisions on his own behalf.  6. DM type 2: Will monitor with ac/hs checks. Continue lantus 30 units at bedtime with SSI for elevated BS. Titrating lantus for better  control.    7. A fib: Will monitor HR every 8 hours. Continue Cardizem and Xarelto.  8. HTN: Monitor BP every 8 hours. D/C HCTZ,reduce lisinopril and  Cont Cardizem.  9. ABLA:   iron supplement.    10. Hyponatremia: Due to sepsis resolving.   11. Wound care:    Left transmet amputation:  Continue  Doxycyline, nitropatch, darco sandle  -clean wound gently with NS and smear wound with silvadene, cover with dry gauze/kerlix---change daily   Right BKA: apply xeroform gauze to incision, cover with 4x4's, kerlix, and ACE wrap---change daily        LOS (Days) 9 A FACE TO FACE EVALUATION WAS PERFORMED  Mahdiya Mossberg T 12/18/2013, 8:40 AM

## 2013-12-18 NOTE — Discharge Summary (Signed)
Physician Discharge Summary  Patient ID: Steven [name] MRN: [id number] DOB/AGE: 11/21/1960 53 y.o.  Admit date: 12/09/2013 Discharge date: 12/18/2013  Discharge Diagnoses:  Principal Problem:   Unilateral complete BKA Active Problems:   Diabetes mellitus type II, uncontrolled   HTN (hypertension)   Peripheral neuropathy   Osteomyelitis of foot   Atrial fibrillation with RVR   Osteomyelitis with abscess of left 1st and 2nd toes--s/p transmet amputation.   Discharged Condition: Stable    Labs:  Basic Metabolic Panel:     Component Value Date/Time   NA 134* 12/11/2013 0437   K 4.2 12/11/2013 0437   CL 94* 12/11/2013 0437   CO2 27 12/11/2013 0437   GLUCOSE 142* 12/11/2013 0437   BUN 7 12/11/2013 0437   CREATININE 0.70 12/11/2013 0437   CREATININE 1.07 02/27/2013 1451   CALCIUM 9.4 12/11/2013 0437   GFRNONAA >90 12/11/2013 0437   GFRAA >90 12/11/2013 0437      CBC:  Recent Labs Lab 12/12/13 1200  WBC 10.4  NEUTROABS 7.0  HGB 9.7*  HCT 30.0*  MCV 88.0  PLT 401*    CBG:  Recent Labs Lab 12/17/13 0720 12/17/13 1110 12/17/13 1639 12/17/13 2111 12/18/13 0717  GLUCAP 79 145* 147* 201* 82    Brief HPI:   Steven [name] is a 53 y.o. male with h/o DM type 2--poorly controlled with peripheral neuropathy, HTN, multiple bilateral diabetic foot ulcers with osteomyelitis and infection who had refused BKA in the past but was admitted on 12/03/13 with severe sepsis due to worsening of right foot ulcers with purulent drainage as well as gas gangrene. He was noted to have A fib with RVR and UDS + cocaine. No BB in setting of cocaine and long term anticoagulation recommended by Dr. Stanford Breed. 2D echo with EF 55-65% and no wall abnormality noted. 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 Cardiology recommends ischemic work up once he recovers from surgery. PT evaluation done and CIR recommended for progressive  therapies.    Hospital Course: Steven [name] was admitted to rehab 12/09/2013 for inpatient therapies to consist of PT, ST and OT at least three hours five days a week. Past admission physiatrist, therapy team and rehab RN have worked together to provide customized collaborative inpatient rehab. Left foot wound has been monitored and silvadene cream as well as NTG patch were ordered per ortho input. He had moderate amount of serosanguinous drainage from wound at admission therefore doxycyline was resumed. Sutures remain intact and drainage from wound is decreasing. R-BKA incision is clean,dry and intact. Blood pressures have been well controlled on prinivil and cardizem.  He continues on Xarelto for A Fib and follow up H/H has been stable Diabetes has been monitored on ac/hs basis and he is to continue lantus 30  Units daily. He was advised to check BS bid-tid basis and follow up with primary MD for further adjustment in insulin.  Pain control has been reasonable with prn oxycodone. He was advised to start tapering to qid prn. He was started on senna for narcotic induced constipation. This was increased to two po bid and he was advised to use suppository every 3 days prn no BM. He has made steady progress and is modified independent at wheelchair level. He will continue to receive HHPT and HHRN by [facility] past discharge.    Rehab course: During patient's stay in rehab weekly team conferences were held to monitor  patient's progress, set goals and discuss barriers to discharge. Patient has had improvement in activity tolerance, balance, postural control, as well as ability to compensate for deficits. He is modified independent for ADL tasks and transfers. He is able to ambulate short distances with RW and min assist. Patient was advised to use electric scooter for functional mobility and ambulate short distances in areas that scooter die not fit.   Disposition:  Home    Diet: Diabetic  diet  Special Instructions: 1. Continue Doxycyline till follow up with Dr. Sharol Given.  2. Check blood sugars 2-3 times a day before meals and record.  3. Do not smoke--this will delay healing of left foot. 4. Use scooter for mobility at home.      Medication List    STOP taking these medications       HYDROcodone-acetaminophen 5-325 MG per tablet  Commonly known as:  NORCO     lisinopril-hydrochlorothiazide 20-25 MG per tablet  Commonly known as:  PRINZIDE,ZESTORETIC      TAKE these medications       albuterol 108 (90 BASE) MCG/ACT inhaler  Commonly known as:  PROVENTIL HFA;VENTOLIN HFA  Inhale 2 puffs into the lungs every 6 (six) hours as needed for wheezing or shortness of breath.     atorvastatin 40 MG tablet  Commonly known as:  LIPITOR  Take 1 tablet (40 mg total) by mouth daily.     cetirizine 10 MG tablet  Commonly known as:  ZYRTEC  Take 1 tablet (10 mg total) by mouth daily.     diltiazem 120 MG 24 hr capsule  Commonly known as:  CARDIZEM CD  Take 1 capsule (120 mg total) by mouth daily.     doxycycline 100 MG capsule  Commonly known as:  VIBRAMYCIN  Take 1 capsule (100 mg total) by mouth 2 (two) times daily.     fluticasone 50 MCG/ACT nasal spray  Commonly known as:  FLONASE  Place 2 sprays into both nostrils daily.     folic acid 1 MG tablet  Commonly known as:  FOLVITE  Take 1 tablet (1 mg total) by mouth daily.     gabapentin 300 MG capsule  Commonly known as:  NEURONTIN  Take 300-600 mg by mouth 3 (three) times daily. Take 300mg  in the morning, take 300mg  at lunch, and take 600mg  at bedtime.     hydrocortisone cream 1 %  Apply 1 application topically 2 (two) times daily as needed for itching.     insulin glargine 100 UNIT/ML injection  Commonly known as:  LANTUS  Inject 0.3 mLs (30 Units total) into the skin at bedtime.     ketoconazole 2 % cream  Commonly known as:  NIZORAL  Apply topically daily. Between buttocks     lisinopril 10 MG  tablet  Commonly known as:  PRINIVIL,ZESTRIL  Take 1 tablet (10 mg total) by mouth daily. For blood pressure     nicotine 14 mg/24hr patch  Commonly known as:  NICODERM CQ - dosed in mg/24 hours  Place 1 patch (14 mg total) onto the skin daily.     nitroGLYCERIN 0.1 mg/hr patch  Commonly known as:  NITRODUR - Dosed in mg/24 hr  Apply to left shin--change patch daily     oxyCODONE 15 MG immediate release tablet--Rx # 100 pills  Commonly known as:  ROXICODONE  Take 1 tablet (15 mg total) by mouth every 6 (six) hours as needed for severe pain.     pantoprazole  40 MG tablet  Commonly known as:  PROTONIX  Take 1 tablet (40 mg total) by mouth at bedtime.     rivaroxaban 20 MG Tabs tablet  Commonly known as:  XARELTO  Take 1 tablet (20 mg total) by mouth daily with supper. For Atrial Fibrillation     senna-docusate 8.6-50 MG per tablet  Commonly known as:  Senokot-S  Take 2 tablets by mouth 2 (two) times daily. For constipation     silver sulfADIAZINE 1 % cream  Commonly known as:  SILVADENE  Apply topically daily. Cleanse left foot wound gently with normal saline. Apply thin smear of silvadene cream. Then cover with dry dressing.  Start taking on:  12/19/2013     thiamine 100 MG tablet  Take 1 tablet (100 mg total) by mouth daily.           Follow-up Information   Follow up with Meredith Staggers, MD On 02/18/2014. (Be there at  11:40   for 12 noon   appointment)    Specialty:  Physical Medicine and Rehabilitation   Contact information:   [address], [place] [address] [phone]       Call Marianna Payment, MD. (for follow up appointment)    Specialty:  Orthopedic Surgery   Contact information:   [address] [place] [address] [phone]       Follow up with Chrisandra Netters, MD On 12/29/2013. (@ 10:30 am)    Specialty:  Family Medicine   Contact information:   [place] [address] [phone]        Follow up with Dola Argyle, MD. Call today. (for follow up on A Fib/irregular heart beat. )    Specialty:  Cardiology   Contact information:   [address] [place] [address] [phone]       Signed: Bary Leriche 12/18/2013, 10:21 AM

## 2013-12-18 NOTE — Progress Notes (Signed)
Pt. Got discharge instructions,follow up appointments and prescriptions.Pt. Ready to go home with family.

## 2013-12-18 NOTE — Discharge Instructions (Signed)
Inpatient Rehab Discharge Instructions  [facility] Discharge date and time:    Activities/Precautions/ Functional Status: Activity: activity as tolerated Diet: diabetic diet Wound Care: Left transmet amputation: clean wound gently with NS and smear wound with silvadene, cover with dry gauze/kerlix---change daily. Wear Darco sandle when out of bed and prefore shoe when in bed.  Right BKA: Apply xeroform gauze to incision, cover with 4x4's, kerlix, and ACE wrap---change daily   Functional status:  ___ No restrictions     ___ Walk up steps independently ___ 24/7 supervision/assistance   ___ Walk up steps with assistance _X__ Intermittent supervision/assistance  ___ Bathe/dress independently _X__ Transfer with walker     ___ Bathe/dress with assistance ___ Walk Independently    ___ Shower independently ___ Walk with assistance    ___ Shower with assistance _X__ No alcohol/Cocaine    ___ Return to work/school ________    COMMUNITY REFERRALS UPON DISCHARGE:    Home Health:   PT      RN                     Agency: [facility] Phone: [phone]   Medical Equipment/Items Ordered: 3n1 commode and tub bench                                                     Agency/Supplier: [facility] @ [phone]   GENERAL COMMUNITY RESOURCES FOR PATIENT/FAMILY:  Support Groups: Amputee Support Group        Special Instructions: 1. Continue Doxycyline till follow up with Dr. Sharol Given.  2. Check blood sugars 2-3 times a day before meals and record.  3. Do not smoke--this will delay healing of left foot. 4. Use scooter for mobility at home.  My questions have been answered and I understand these instructions. I will adhere to these goals and the provided educational materials after my discharge from the hospital.  Patient/Caregiver Signature _______________________________ Date __________  Clinician Signature _______________________________________ Date __________  Please bring  this form and your medication list with you to all your follow-up doctor's appointments.

## 2013-12-18 NOTE — Progress Notes (Signed)
Social Work  Discharge Note  The overall goal for the admission was met for:   Discharge location: Yes - home alone with intermittent support of sister  Length of Stay: Yes - 9 days  Discharge activity level: Yes - modified independent  Home/community participation: Yes  Services provided included: MD, RD, PT, OT, RN, Pharmacy and SW  Financial Services: Medicaid  Follow-up services arranged: Home Health: RN, PT via [facility], DME: 3n1 commode and tub bench via [facility]  and Patient/Family has no preference for HH/DME agencies  Comments (or additional information):  Patient/Family verbalized understanding of follow-up arrangements: Yes  Individual responsible for coordination of the follow-up plan: patient  Confirmed correct DME delivered: Aayla Marrocco 12/18/2013    Normajean Nash

## 2013-12-22 ENCOUNTER — Other Ambulatory Visit: Payer: Self-pay | Admitting: *Deleted

## 2013-12-23 ENCOUNTER — Other Ambulatory Visit: Payer: Self-pay | Admitting: *Deleted

## 2013-12-25 ENCOUNTER — Other Ambulatory Visit (HOSPITAL_COMMUNITY): Payer: Self-pay | Admitting: Orthopaedic Surgery

## 2013-12-26 ENCOUNTER — Encounter (HOSPITAL_COMMUNITY): Payer: Self-pay | Admitting: *Deleted

## 2013-12-26 MED ORDER — GABAPENTIN 300 MG PO CAPS: 300.0000 mg | ORAL_CAPSULE | Freq: Three times a day (TID) | ORAL | Status: DC

## 2013-12-26 NOTE — Progress Notes (Signed)
Anesthesia Chart Review: Patient is a 53 year old male scheduled for left BKA on 12/29/13 by Dr. Erlinda Hong. He is scheduled to be a same day work-up.  History includes recent former smoker, DM2 with peripheral neuropathy, HTN, ETOH abuse, illicit drug use including cocaine but denied any recent use since his hospitalization thru 12/18/13 on (UDS positive 12/04/13), DVT, PVD with ulcerations and osteomyelitis s/p  right 2nd toe ray amp 04/2011, right 1st toe ray amp 04/2012, left 5th toe ray amp 04/23/13, left 2nd toe ray amp 11/21/13, and right BKA 12/04/13. He was admitted on 12/03/13 for RLE osteomyelitis with sepsis and rapid afib with positive UDS for cocaine. He converted spontaneously in the ED after antibiotic and fluid bolus.  He was seen by cardiologist Dr. Kirk Ruths who felt his PAF trigger was likely increased adrenal state due to infection (UDS also ended up being positive for cocaine).  He ultimately underwent right BKA on 12/04/13 with plans to further ischemic work-up and anticoagulation therapy after his sepsis/infection had resolved and procedures completed.  He was not having angina.  He did have brief recurrent SVT post-operatively treated with Cardizem.  He was also discharged on 12/18/13 with Xarelto and Cardizem. No beta blocker recommended due to cocaine use.  PCP is with [facility]. He saw multiple cardiologists during his recent hospitalization.  His follow-up appointment is scheduled with Dr. Percival Spanish on 01/20/14.   EKG on 12/04/13 showed: NSR, T wave abnormality, consider lateral ischemia, prolonged QT. Afib had resolved from 12/03/13.  High lateral T wave abnormality had been present on previous EKGs dating back to at least 04/22/13.   Echo on 12/04/13 showed: - Left ventricle: The cavity size was normal. Systolic function was normal. The estimated ejection fraction was in the range of 55% to 60%. Wall motion was normal; there were no regional wall motion  abnormalities. - Left atrium: Mildly dilated.  - Mitral valve: There was trivial regurgitation. - Left atrium: The atrium was mildly dilated. - Tricuspid valve: There was trivial regurgitation. - Pulmonic valve: There was trivial regurgitation.  CXR on 12/03/13 showed No active cardiopulmonary disease.  His interviewing PAT RN has contacted Dr. Phoebe Sharps office regarding giving patient perioperative instructions for Xarelto. He will need labs no arrival.  Above history reviewed with anesthesiologist Dr. Tobias Alexander.  Patient had recent afib with RVR but spontaneously converted to NSR.  He since underwent right BKA with rehab stay with discharge last week.  He is now scheduled for left BKA.  His recent echo was overall unremarkable. According to notes, he will be considered for a stress test in the future following recovery from his surgeries. His cardiology follow-up is not scheduled until 01/2014. If no acute CV/CHF symptoms or recurrent arrythmias then it is anticipated that he can proceed as planned from an anesthesia standpoint.   Further evaluation by his assigned anesthesiologist on the day of surgery.  Myra Gianotti, PA-C [facility]/Anesthesiology Phone [phone] 12/26/2013 1:20 PM

## 2013-12-26 NOTE — Progress Notes (Signed)
12/26/13 1103  OBSTRUCTIVE SLEEP APNEA  Have you ever been diagnosed with sleep apnea through a sleep study? No  Do you snore loudly (loud enough to be heard through closed doors)?  0  Do you often feel tired, fatigued, or sleepy during the daytime? 1  Has anyone observed you stop breathing during your sleep? 0  Do you have, or are you being treated for high blood pressure? 1  BMI more than 35 kg/m2? 1  Age over 55 years old? 1  Gender: 1  Obstructive Sleep Apnea Score 5

## 2013-12-26 NOTE — Progress Notes (Signed)
I received a call from Dr Phoebe Sharps office and was instructed to have patient to stop Xarelto on Sat.  I called patients sister [name] and informed her to stop Xarelto.  I also instructed Pam of medication to take the AM of surgery, Cardizem, Gabapentin, Zyrtec, Oxy if needed for pain.  I asked PAM to remind patient to not take Insulin the am of surgery.

## 2013-12-26 NOTE — Progress Notes (Signed)
Steven [name] does not know names of medications, just what 'some' of them are for and color. Pt reported that his sister pam fixes his medications for him.  I asked patient if Dr told him when to stop Xarelto , patient did not know.  Icalled Pam's number and got her voice mail , I informed her why i was calling and that I need to get in touch with her about what medications patient can take the day of surgery. I called Dr Phoebe Sharps office to ask when the Xarelto should be stopped.  The PA is going to call me back.

## 2013-12-28 MED ORDER — CEFAZOLIN SODIUM-DEXTROSE 2-3 GM-% IV SOLR
2.0000 g | INTRAVENOUS | Status: AC
  Administered 2013-12-29: 2 g via INTRAVENOUS
  Filled 2013-12-28: qty 50

## 2013-12-29 ENCOUNTER — Inpatient Hospital Stay (HOSPITAL_COMMUNITY): Payer: Medicaid Other | Admitting: Vascular Surgery

## 2013-12-29 ENCOUNTER — Encounter (HOSPITAL_COMMUNITY): Payer: Medicaid Other | Admitting: Vascular Surgery

## 2013-12-29 ENCOUNTER — Inpatient Hospital Stay (HOSPITAL_COMMUNITY)
Admission: RE | Admit: 2013-12-29 | Discharge: 2014-01-01 | DRG: 240 | Disposition: A | Discharge status: Home Health | Payer: Medicaid Other | Source: Ambulatory Visit | Attending: Orthopaedic Surgery | Admitting: Orthopaedic Surgery

## 2013-12-29 ENCOUNTER — Encounter (HOSPITAL_COMMUNITY)
Admission: RE | Disposition: A | Discharge status: Home Health | Payer: Self-pay | Source: Ambulatory Visit | Attending: Orthopaedic Surgery

## 2013-12-29 ENCOUNTER — Inpatient Hospital Stay: Payer: Medicaid Other | Admitting: Family Medicine

## 2013-12-29 DIAGNOSIS — S98119A Complete traumatic amputation of unspecified great toe, initial encounter: Secondary | ICD-10-CM | POA: Diagnosis not present

## 2013-12-29 DIAGNOSIS — Z87891 Personal history of nicotine dependence: Secondary | ICD-10-CM

## 2013-12-29 DIAGNOSIS — S98139A Complete traumatic amputation of one unspecified lesser toe, initial encounter: Secondary | ICD-10-CM | POA: Diagnosis not present

## 2013-12-29 DIAGNOSIS — Z794 Long term (current) use of insulin: Secondary | ICD-10-CM

## 2013-12-29 DIAGNOSIS — S88119A Complete traumatic amputation at level between knee and ankle, unspecified lower leg, initial encounter: Secondary | ICD-10-CM

## 2013-12-29 DIAGNOSIS — Z9119 Patient's noncompliance with other medical treatment and regimen: Secondary | ICD-10-CM

## 2013-12-29 DIAGNOSIS — M9080 Osteopathy in diseases classified elsewhere, unspecified site: Secondary | ICD-10-CM | POA: Diagnosis present

## 2013-12-29 DIAGNOSIS — Z7901 Long term (current) use of anticoagulants: Secondary | ICD-10-CM

## 2013-12-29 DIAGNOSIS — Z91199 Patient's noncompliance with other medical treatment and regimen due to unspecified reason: Secondary | ICD-10-CM

## 2013-12-29 DIAGNOSIS — I10 Essential (primary) hypertension: Secondary | ICD-10-CM | POA: Diagnosis present

## 2013-12-29 DIAGNOSIS — I739 Peripheral vascular disease, unspecified: Secondary | ICD-10-CM | POA: Diagnosis present

## 2013-12-29 DIAGNOSIS — Z86718 Personal history of other venous thrombosis and embolism: Secondary | ICD-10-CM | POA: Diagnosis not present

## 2013-12-29 DIAGNOSIS — G609 Hereditary and idiopathic neuropathy, unspecified: Secondary | ICD-10-CM | POA: Diagnosis present

## 2013-12-29 DIAGNOSIS — Z79899 Other long term (current) drug therapy: Secondary | ICD-10-CM | POA: Diagnosis not present

## 2013-12-29 DIAGNOSIS — E1165 Type 2 diabetes mellitus with hyperglycemia: Secondary | ICD-10-CM | POA: Diagnosis present

## 2013-12-29 DIAGNOSIS — F1410 Cocaine abuse, uncomplicated: Secondary | ICD-10-CM | POA: Diagnosis present

## 2013-12-29 DIAGNOSIS — E1169 Type 2 diabetes mellitus with other specified complication: Secondary | ICD-10-CM

## 2013-12-29 DIAGNOSIS — I4891 Unspecified atrial fibrillation: Secondary | ICD-10-CM | POA: Diagnosis present

## 2013-12-29 DIAGNOSIS — I96 Gangrene, not elsewhere classified: Secondary | ICD-10-CM | POA: Diagnosis present

## 2013-12-29 DIAGNOSIS — M86179 Other acute osteomyelitis, unspecified ankle and foot: Secondary | ICD-10-CM | POA: Diagnosis present

## 2013-12-29 DIAGNOSIS — D62 Acute posthemorrhagic anemia: Secondary | ICD-10-CM | POA: Diagnosis not present

## 2013-12-29 DIAGNOSIS — F1010 Alcohol abuse, uncomplicated: Secondary | ICD-10-CM | POA: Diagnosis present

## 2013-12-29 DIAGNOSIS — IMO0002 Reserved for concepts with insufficient information to code with codable children: Secondary | ICD-10-CM | POA: Diagnosis present

## 2013-12-29 HISTORY — DX: Cardiac arrhythmia, unspecified: I49.9

## 2013-12-29 HISTORY — PX: AMPUTATION: SHX166

## 2013-12-29 HISTORY — DX: Other seasonal allergic rhinitis: J30.2

## 2013-12-29 LAB — COMPREHENSIVE METABOLIC PANEL
ALBUMIN: 2.6 g/dL — AB (ref 3.5–5.2)
ALT: 21 U/L (ref 0–53)
AST: 19 U/L (ref 0–37)
Alkaline Phosphatase: 159 U/L — ABNORMAL HIGH (ref 39–117)
Anion gap: 13 (ref 5–15)
BUN: 11 mg/dL (ref 6–23)
CO2: 26 mEq/L (ref 19–32)
CREATININE: 0.67 mg/dL (ref 0.50–1.35)
Calcium: 9.2 mg/dL (ref 8.4–10.5)
Chloride: 95 mEq/L — ABNORMAL LOW (ref 96–112)
GFR calc Af Amer: >90 mL/min (ref >90)
GFR calc non Af Amer: >90 mL/min (ref >90)
Glucose, Bld: 309 mg/dL — ABNORMAL HIGH (ref 70–99)
Potassium: 4.2 mEq/L (ref 3.7–5.3)
Sodium: 134 mEq/L — ABNORMAL LOW (ref 137–147)
Total Bilirubin: 0.2 mg/dL — ABNORMAL LOW (ref 0.3–1.2)
Total Protein: 8.7 g/dL — ABNORMAL HIGH (ref 6.0–8.3)

## 2013-12-29 LAB — CBC
HEMATOCRIT: 33.1 % — AB (ref 39.0–52.0)
Hemoglobin: 10.8 g/dL — ABNORMAL LOW (ref 13.0–17.0)
MCH: 28.3 pg (ref 26.0–34.0)
MCHC: 32.6 g/dL (ref 30.0–36.0)
MCV: 86.6 fL (ref 78.0–100.0)
PLATELETS: 382 10*3/uL (ref 150–400)
RBC: 3.82 MIL/uL — ABNORMAL LOW (ref 4.22–5.81)
RDW: 14.2 % (ref 11.5–15.5)
WBC: 8 10*3/uL (ref 4.0–10.5)

## 2013-12-29 LAB — GLUCOSE, CAPILLARY
GLUCOSE-CAPILLARY: 292 mg/dL — AB (ref 70–99)
GLUCOSE-CAPILLARY: 174 mg/dL — AB (ref 70–99)
Glucose-Capillary: 245 mg/dL — ABNORMAL HIGH (ref 70–99)
Glucose-Capillary: 403 mg/dL — ABNORMAL HIGH (ref 70–99)

## 2013-12-29 SURGERY — AMPUTATION BELOW KNEE
Anesthesia: General | Site: Foot | Laterality: Left

## 2013-12-29 MED ORDER — PHENYLEPHRINE 40 MCG/ML (10ML) SYRINGE FOR IV PUSH (FOR BLOOD PRESSURE SUPPORT)
PREFILLED_SYRINGE | INTRAVENOUS | Status: AC
  Filled 2013-12-29: qty 10

## 2013-12-29 MED ORDER — OXYCODONE HCL 5 MG PO TABS: 5.0000 mg | ORAL_TABLET | Freq: Once | ORAL | Status: DC | PRN

## 2013-12-29 MED ORDER — FENTANYL CITRATE 0.05 MG/ML IJ SOLN
INTRAMUSCULAR | Status: AC
  Filled 2013-12-29: qty 5

## 2013-12-29 MED ORDER — HYDROMORPHONE HCL PF 1 MG/ML IJ SOLN
0.2500 mg | INTRAMUSCULAR | Status: DC | PRN
  Administered 2013-12-29 (×5): 0.5 mg via INTRAVENOUS

## 2013-12-29 MED ORDER — LORATADINE 10 MG PO TABS
10.0000 mg | ORAL_TABLET | Freq: Every day | ORAL | Status: DC
  Administered 2013-12-29 – 2014-01-01 (×4): 10 mg via ORAL
  Filled 2013-12-29 (×4): qty 1

## 2013-12-29 MED ORDER — ONDANSETRON HCL 4 MG/2ML IJ SOLN
INTRAMUSCULAR | Status: AC
  Filled 2013-12-29: qty 2

## 2013-12-29 MED ORDER — CEFAZOLIN SODIUM-DEXTROSE 2-3 GM-% IV SOLR
2.0000 g | Freq: Four times a day (QID) | INTRAVENOUS | Status: AC
  Administered 2013-12-29 – 2013-12-30 (×3): 2 g via INTRAVENOUS
  Filled 2013-12-29 (×3): qty 50

## 2013-12-29 MED ORDER — 0.9 % SODIUM CHLORIDE (POUR BTL) OPTIME
TOPICAL | Status: DC | PRN
  Administered 2013-12-29: 1000 mL

## 2013-12-29 MED ORDER — DIPHENHYDRAMINE HCL 12.5 MG/5ML PO ELIX
25.0000 mg | ORAL_SOLUTION | ORAL | Status: DC | PRN
  Administered 2013-12-29 – 2013-12-31 (×2): 25 mg via ORAL
  Filled 2013-12-29 (×2): qty 10

## 2013-12-29 MED ORDER — LIDOCAINE HCL (CARDIAC) 20 MG/ML IV SOLN
INTRAVENOUS | Status: DC | PRN
  Administered 2013-12-29: 80 mg via INTRAVENOUS

## 2013-12-29 MED ORDER — FLUTICASONE PROPIONATE 50 MCG/ACT NA SUSP
2.0000 | Freq: Every day | NASAL | Status: DC
  Administered 2013-12-29 – 2014-01-01 (×4): 2 via NASAL
  Filled 2013-12-29: qty 16

## 2013-12-29 MED ORDER — MIDAZOLAM HCL 2 MG/2ML IJ SOLN
INTRAMUSCULAR | Status: AC
  Filled 2013-12-29: qty 2

## 2013-12-29 MED ORDER — INSULIN GLARGINE 100 UNIT/ML ~~LOC~~ SOLN
30.0000 [IU] | Freq: Every day | SUBCUTANEOUS | Status: DC
  Administered 2013-12-29 – 2013-12-30 (×2): 30 [IU] via SUBCUTANEOUS
  Filled 2013-12-29 (×3): qty 0.3

## 2013-12-29 MED ORDER — SODIUM CHLORIDE 0.9 % IV SOLN
INTRAVENOUS | Status: DC | PRN
  Administered 2013-12-29: 11:00:00 via INTRAVENOUS

## 2013-12-29 MED ORDER — SORBITOL 70 % SOLN: 30.0000 mL | Freq: Every day | Status: DC | PRN

## 2013-12-29 MED ORDER — HYDROMORPHONE HCL PF 1 MG/ML IJ SOLN
0.5000 mg | INTRAMUSCULAR | Status: DC | PRN
  Administered 2013-12-29 (×2): 0.5 mg via INTRAVENOUS

## 2013-12-29 MED ORDER — POLYETHYLENE GLYCOL 3350 17 G PO PACK: 17.0000 g | PACK | Freq: Every day | ORAL | Status: DC | PRN

## 2013-12-29 MED ORDER — KETOROLAC TROMETHAMINE 30 MG/ML IJ SOLN
30.0000 mg | Freq: Four times a day (QID) | INTRAMUSCULAR | Status: AC | PRN
  Administered 2013-12-29 – 2013-12-30 (×3): 30 mg via INTRAVENOUS
  Filled 2013-12-29 (×3): qty 1

## 2013-12-29 MED ORDER — INSULIN ASPART 100 UNIT/ML ~~LOC~~ SOLN
0.0000 [IU] | Freq: Three times a day (TID) | SUBCUTANEOUS | Status: DC
  Administered 2013-12-29: 15 [IU] via SUBCUTANEOUS
  Administered 2013-12-30: 5 [IU] via SUBCUTANEOUS
  Administered 2013-12-30: 11 [IU] via SUBCUTANEOUS
  Administered 2013-12-30: 8 [IU] via SUBCUTANEOUS
  Administered 2013-12-31: 5 [IU] via SUBCUTANEOUS

## 2013-12-29 MED ORDER — INSULIN ASPART 100 UNIT/ML ~~LOC~~ SOLN: 0.0000 [IU] | Freq: Three times a day (TID) | SUBCUTANEOUS | Status: DC

## 2013-12-29 MED ORDER — DILTIAZEM HCL ER COATED BEADS 120 MG PO CP24
120.0000 mg | ORAL_CAPSULE | Freq: Every day | ORAL | Status: DC
  Administered 2013-12-30 – 2014-01-01 (×3): 120 mg via ORAL
  Filled 2013-12-29 (×3): qty 1

## 2013-12-29 MED ORDER — PANTOPRAZOLE SODIUM 40 MG PO TBEC
40.0000 mg | DELAYED_RELEASE_TABLET | Freq: Every day | ORAL | Status: DC
Start: 2013-12-29 — End: 2014-01-01
  Administered 2013-12-29 – 2013-12-31 (×3): 40 mg via ORAL
  Filled 2013-12-29 (×3): qty 1

## 2013-12-29 MED ORDER — SODIUM CHLORIDE 0.9 % IV SOLN
INTRAVENOUS | Status: DC
  Administered 2013-12-29 – 2013-12-30 (×3): via INTRAVENOUS

## 2013-12-29 MED ORDER — LACTATED RINGERS IV SOLN
INTRAVENOUS | Status: DC
  Administered 2013-12-29: 50 mL/h via INTRAVENOUS

## 2013-12-29 MED ORDER — HYDROMORPHONE HCL PF 1 MG/ML IJ SOLN
INTRAMUSCULAR | Status: AC
  Filled 2013-12-29: qty 1

## 2013-12-29 MED ORDER — NICOTINE 14 MG/24HR TD PT24
14.0000 mg | MEDICATED_PATCH | TRANSDERMAL | Status: DC
  Administered 2013-12-30 – 2014-01-01 (×3): 14 mg via TRANSDERMAL
  Filled 2013-12-29 (×3): qty 1

## 2013-12-29 MED ORDER — LACTATED RINGERS IV SOLN
INTRAVENOUS | Status: DC | PRN
  Administered 2013-12-29: 10:00:00 via INTRAVENOUS

## 2013-12-29 MED ORDER — OXYCODONE HCL 5 MG PO TABS
5.0000 mg | ORAL_TABLET | ORAL | Status: DC | PRN
  Administered 2013-12-29 – 2014-01-01 (×15): 15 mg via ORAL
  Filled 2013-12-29 (×8): qty 3
  Filled 2013-12-29: qty 2
  Filled 2013-12-29 (×7): qty 3

## 2013-12-29 MED ORDER — ONDANSETRON HCL 4 MG PO TABS: 4.0000 mg | ORAL_TABLET | Freq: Four times a day (QID) | ORAL | Status: DC | PRN

## 2013-12-29 MED ORDER — FENTANYL CITRATE 0.05 MG/ML IJ SOLN
INTRAMUSCULAR | Status: DC | PRN
Start: 2013-12-29 — End: 2013-12-29
  Administered 2013-12-29 (×6): 50 ug via INTRAVENOUS

## 2013-12-29 MED ORDER — ONDANSETRON HCL 4 MG/2ML IJ SOLN: 4.0000 mg | Freq: Four times a day (QID) | INTRAMUSCULAR | Status: DC | PRN

## 2013-12-29 MED ORDER — ALBUTEROL SULFATE (2.5 MG/3ML) 0.083% IN NEBU: 3.0000 mL | INHALATION_SOLUTION | Freq: Four times a day (QID) | RESPIRATORY_TRACT | Status: DC | PRN

## 2013-12-29 MED ORDER — METHOCARBAMOL 500 MG PO TABS
500.0000 mg | ORAL_TABLET | Freq: Four times a day (QID) | ORAL | Status: DC | PRN
  Administered 2013-12-29 – 2014-01-01 (×9): 500 mg via ORAL
  Filled 2013-12-29 (×11): qty 1

## 2013-12-29 MED ORDER — GABAPENTIN 300 MG PO CAPS: 300.0000 mg | ORAL_CAPSULE | Freq: Three times a day (TID) | ORAL | Status: DC

## 2013-12-29 MED ORDER — MAGNESIUM CITRATE PO SOLN: 1.0000 | Freq: Once | ORAL | Status: AC | PRN

## 2013-12-29 MED ORDER — SENNA 8.6 MG PO TABS
1.0000 | ORAL_TABLET | Freq: Two times a day (BID) | ORAL | Status: DC
  Administered 2013-12-29 – 2014-01-01 (×6): 8.6 mg via ORAL
  Filled 2013-12-29 (×9): qty 1

## 2013-12-29 MED ORDER — NITROGLYCERIN 0.2 MG/HR TD PT24
0.2000 mg | MEDICATED_PATCH | TRANSDERMAL | Status: DC
  Administered 2013-12-29 – 2013-12-31 (×3): 0.2 mg via TRANSDERMAL
  Filled 2013-12-29 (×4): qty 1

## 2013-12-29 MED ORDER — METHOCARBAMOL 1000 MG/10ML IJ SOLN
500.0000 mg | Freq: Four times a day (QID) | INTRAVENOUS | Status: DC | PRN
  Administered 2013-12-29: 500 mg via INTRAVENOUS
  Filled 2013-12-29: qty 5

## 2013-12-29 MED ORDER — RIVAROXABAN 20 MG PO TABS
20.0000 mg | ORAL_TABLET | Freq: Every day | ORAL | Status: DC
  Administered 2013-12-29 – 2013-12-31 (×3): 20 mg via ORAL
  Filled 2013-12-29 (×4): qty 1

## 2013-12-29 MED ORDER — HYDROMORPHONE HCL PF 1 MG/ML IJ SOLN
INTRAMUSCULAR | Status: AC
  Filled 2013-12-29: qty 2

## 2013-12-29 MED ORDER — METOCLOPRAMIDE HCL 10 MG PO TABS: 5.0000 mg | ORAL_TABLET | Freq: Three times a day (TID) | ORAL | Status: DC | PRN

## 2013-12-29 MED ORDER — PROPOFOL 10 MG/ML IV BOLUS
INTRAVENOUS | Status: AC
Start: 2013-12-29 — End: 2013-12-29
  Filled 2013-12-29: qty 20

## 2013-12-29 MED ORDER — OXYCODONE HCL 5 MG PO TABS
ORAL_TABLET | ORAL | Status: AC
  Administered 2013-12-29: 15 mg via ORAL
  Filled 2013-12-29: qty 1

## 2013-12-29 MED ORDER — METOCLOPRAMIDE HCL 5 MG/ML IJ SOLN: 5.0000 mg | Freq: Three times a day (TID) | INTRAMUSCULAR | Status: DC | PRN

## 2013-12-29 MED ORDER — OXYCODONE HCL 5 MG/5ML PO SOLN: 5.0000 mg | Freq: Once | ORAL | Status: DC | PRN

## 2013-12-29 MED ORDER — ARTIFICIAL TEARS OP OINT
TOPICAL_OINTMENT | OPHTHALMIC | Status: DC | PRN
  Administered 2013-12-29: 1 via OPHTHALMIC

## 2013-12-29 MED ORDER — FOLIC ACID 1 MG PO TABS
1.0000 mg | ORAL_TABLET | Freq: Every day | ORAL | Status: DC
  Administered 2013-12-29 – 2014-01-01 (×4): 1 mg via ORAL
  Filled 2013-12-29 (×4): qty 1

## 2013-12-29 MED ORDER — GABAPENTIN 300 MG PO CAPS
600.0000 mg | ORAL_CAPSULE | Freq: Every day | ORAL | Status: DC
  Administered 2013-12-29 – 2013-12-31 (×3): 600 mg via ORAL
  Filled 2013-12-29 (×4): qty 2

## 2013-12-29 MED ORDER — SENNOSIDES-DOCUSATE SODIUM 8.6-50 MG PO TABS
2.0000 | ORAL_TABLET | Freq: Two times a day (BID) | ORAL | Status: DC
  Administered 2013-12-29 – 2013-12-31 (×5): 2 via ORAL
  Filled 2013-12-29 (×5): qty 2

## 2013-12-29 MED ORDER — PROPOFOL 10 MG/ML IV BOLUS
INTRAVENOUS | Status: DC | PRN
  Administered 2013-12-29 (×2): 30 mg via INTRAVENOUS
  Administered 2013-12-29: 200 mg via INTRAVENOUS

## 2013-12-29 MED ORDER — MORPHINE SULFATE 2 MG/ML IJ SOLN
1.0000 mg | INTRAMUSCULAR | Status: DC | PRN
  Administered 2013-12-29: 1 mg via INTRAVENOUS
  Filled 2013-12-29: qty 1

## 2013-12-29 MED ORDER — PROPOFOL 10 MG/ML IV BOLUS
INTRAVENOUS | Status: AC
  Filled 2013-12-29: qty 20

## 2013-12-29 MED ORDER — VITAMIN B-1 100 MG PO TABS
100.0000 mg | ORAL_TABLET | Freq: Every day | ORAL | Status: DC
  Administered 2013-12-29 – 2014-01-01 (×4): 100 mg via ORAL
  Filled 2013-12-29 (×4): qty 1

## 2013-12-29 MED ORDER — ROCURONIUM BROMIDE 50 MG/5ML IV SOLN
INTRAVENOUS | Status: AC
  Filled 2013-12-29: qty 1

## 2013-12-29 MED ORDER — GABAPENTIN 300 MG PO CAPS
300.0000 mg | ORAL_CAPSULE | Freq: Two times a day (BID) | ORAL | Status: DC
  Administered 2013-12-30 – 2014-01-01 (×6): 300 mg via ORAL
  Filled 2013-12-29 (×8): qty 1

## 2013-12-29 MED ORDER — SODIUM CHLORIDE 0.9 % IR SOLN
Status: DC | PRN
  Administered 2013-12-29: 3000 mL

## 2013-12-29 MED ORDER — HYDROCODONE-ACETAMINOPHEN 5-325 MG PO TABS
1.0000 | ORAL_TABLET | ORAL | Status: DC | PRN
  Administered 2013-12-29 – 2014-01-01 (×12): 2 via ORAL
  Filled 2013-12-29 (×12): qty 2

## 2013-12-29 MED ORDER — ATORVASTATIN CALCIUM 40 MG PO TABS
40.0000 mg | ORAL_TABLET | Freq: Every day | ORAL | Status: DC
  Administered 2013-12-29 – 2014-01-01 (×4): 40 mg via ORAL
  Filled 2013-12-29 (×4): qty 1

## 2013-12-29 MED ORDER — LISINOPRIL 10 MG PO TABS
10.0000 mg | ORAL_TABLET | Freq: Every day | ORAL | Status: DC
  Administered 2013-12-29 – 2014-01-01 (×4): 10 mg via ORAL
  Filled 2013-12-29 (×4): qty 1

## 2013-12-29 MED ORDER — PHENYLEPHRINE HCL 10 MG/ML IJ SOLN
INTRAMUSCULAR | Status: DC | PRN
  Administered 2013-12-29 (×2): 80 ug via INTRAVENOUS
  Administered 2013-12-29: 120 ug via INTRAVENOUS
  Administered 2013-12-29: 80 ug via INTRAVENOUS
  Administered 2013-12-29: 40 ug via INTRAVENOUS

## 2013-12-29 SURGICAL SUPPLY — 52 items
BANDAGE ELASTIC 6 VELCRO ST LF (GAUZE/BANDAGES/DRESSINGS) ×3 IMPLANT
BANDAGE ESMARK 6X9 LF (GAUZE/BANDAGES/DRESSINGS) ×1 IMPLANT
BANDAGE GAUZE ELAST BULKY 4 IN (GAUZE/BANDAGES/DRESSINGS) IMPLANT
BLADE SAW RECIP 87.9 MT (BLADE) ×3 IMPLANT
BNDG COHESIVE 4X5 TAN STRL (GAUZE/BANDAGES/DRESSINGS) IMPLANT
BNDG COHESIVE 6X5 TAN STRL LF (GAUZE/BANDAGES/DRESSINGS) ×6 IMPLANT
BNDG ESMARK 6X9 LF (GAUZE/BANDAGES/DRESSINGS) ×3
BNDG GAUZE ELAST 4 BULKY (GAUZE/BANDAGES/DRESSINGS) ×3 IMPLANT
COVER SURGICAL LIGHT HANDLE (MISCELLANEOUS) ×3 IMPLANT
CUFF TOURNIQUET SINGLE 34IN LL (TOURNIQUET CUFF) ×3 IMPLANT
DRAPE EXTREMITY T 121X128X90 (DRAPE) ×3 IMPLANT
DRAPE ORTHO SPLIT 77X108 STRL (DRAPES)
DRAPE PROXIMA HALF (DRAPES) ×3 IMPLANT
DRAPE SURG ORHT 6 SPLT 77X108 (DRAPES) IMPLANT
DRAPE U-SHAPE 47X51 STRL (DRAPES) ×3 IMPLANT
DRSG PAD ABDOMINAL 8X10 ST (GAUZE/BANDAGES/DRESSINGS) ×6 IMPLANT
ELECT CAUTERY BLADE 6.4 (BLADE) ×3 IMPLANT
ELECT REM PT RETURN 9FT ADLT (ELECTROSURGICAL) ×3
ELECTRODE REM PT RTRN 9FT ADLT (ELECTROSURGICAL) ×1 IMPLANT
EVACUATOR 1/8 PVC DRAIN (DRAIN) ×3 IMPLANT
FACESHIELD WRAPAROUND (MASK) IMPLANT
GAUZE XEROFORM 5X9 LF (GAUZE/BANDAGES/DRESSINGS) ×3 IMPLANT
GLOVE BIOGEL PI IND STRL 7.0 (GLOVE) ×1 IMPLANT
GLOVE BIOGEL PI INDICATOR 7.0 (GLOVE) ×2
GLOVE SURG SS PI 6.5 STRL IVOR (GLOVE) ×3 IMPLANT
GLOVE SURG SS PI 7.5 STRL IVOR (GLOVE) ×6 IMPLANT
GLOVE SURG SYN 7.5  E (GLOVE) ×4
GLOVE SURG SYN 7.5 E (GLOVE) ×2 IMPLANT
GOWN STRL REIN XL XLG (GOWN DISPOSABLE) ×3 IMPLANT
GOWN STRL REUS W/ TWL LRG LVL3 (GOWN DISPOSABLE) ×1 IMPLANT
GOWN STRL REUS W/TWL LRG LVL3 (GOWN DISPOSABLE) ×2
KIT BASIN OR (CUSTOM PROCEDURE TRAY) ×3 IMPLANT
NS IRRIG 1000ML POUR BTL (IV SOLUTION) ×3 IMPLANT
PACK GENERAL/GYN (CUSTOM PROCEDURE TRAY) ×3 IMPLANT
PAD ARMBOARD 7.5X6 YLW CONV (MISCELLANEOUS) ×3 IMPLANT
PAD CAST 4YDX4 CTTN HI CHSV (CAST SUPPLIES) IMPLANT
PADDING CAST COTTON 4X4 STRL (CAST SUPPLIES)
SET CYSTO W/LG BORE CLAMP LF (SET/KITS/TRAYS/PACK) ×3 IMPLANT
SPONGE GAUZE 4X4 12PLY (GAUZE/BANDAGES/DRESSINGS) ×3 IMPLANT
SPONGE LAP 18X18 X RAY DECT (DISPOSABLE) IMPLANT
STAPLER VISISTAT 35W (STAPLE) ×3 IMPLANT
STOCKINETTE IMPERVIOUS LG (DRAPES) ×3 IMPLANT
SUT ETHILON 2 0 PSLX (SUTURE) ×9 IMPLANT
SUT MON AB 2-0 CT1 36 (SUTURE) ×9 IMPLANT
SUT PDS AB 0 CT 36 (SUTURE) ×6 IMPLANT
SUT SILK 0 TIES 10X30 (SUTURE) ×3 IMPLANT
SUT SILK 2 0 SH CR/8 (SUTURE) IMPLANT
SUT VIC AB 2-0 CT1 27 (SUTURE) ×4
SUT VIC AB 2-0 CT1 TAPERPNT 27 (SUTURE) ×2 IMPLANT
TAPE HY-TAPE 1X5Y PINK NS LF (GAUZE/BANDAGES/DRESSINGS) ×3 IMPLANT
TOWEL OR 17X26 10 PK STRL BLUE (TOWEL DISPOSABLE) ×3 IMPLANT
UNDERPAD 30X30 INCONTINENT (UNDERPADS AND DIAPERS) ×3 IMPLANT

## 2013-12-29 NOTE — Progress Notes (Signed)
Utilization review completed.  

## 2013-12-29 NOTE — Clinical Social Work Psychosocial (Signed)
Clinical Social Work Department BRIEF PSYCHOSOCIAL ASSESSMENT 12/29/2013  Patient:  Steven [name],Steven [name]     Account Number:  [id number]     Admit date:  07/05/2006  Clinical Social Worker:  Delrae Sawyers  Date/Time:  12/29/2013 03:25 PM  Referred by:  Physician  Date Referred:  12/29/2013 Referred for  SNF Placement   Other Referral:   none.   Interview type:  Patient Other interview type:   none.    PSYCHOSOCIAL DATA Living Status:  ALONE Admitted from facility:   Level of care:   Primary support name:  Ed Primary support relationship to patient:  FRIEND Degree of support available:   Adequate support system.    CURRENT CONCERNS Current Concerns  Post-Acute Placement   Other Concerns:   none.    SOCIAL WORK ASSESSMENT / PLAN CSW consulted for possible SNF placement at time of discharge. CSW met with pt at bedside to discuss discharge dispostion. Pt stated he has used home health services on prior admissions and expects to be discharged home with home health services. CSW informed pt CSW will continue to follow and assist with pt is recommended for SNF placement.   Assessment/plan status:  Psychosocial Support/Ongoing Assessment of Needs Other assessment/ plan:   none.   Information/referral to community resources:   Awaiting recommendations.    PATIENT'S/FAMILY'S RESPONSE TO PLAN OF CARE: Pt understanding and agreeable to CSW plan of care. Pt expressed no further questions or concerns at this time.       Lubertha Sayres, MSW, [facility] Licensed Clinical Social Worker [phone] and [phone]

## 2013-12-29 NOTE — Anesthesia Postprocedure Evaluation (Signed)
  Anesthesia Post-op Note  Patient: Steven [name]  Procedure(s) Performed: Procedure(s): LEFT AMPUTATION BELOW KNEE (Left)  Patient Location: PACU  Anesthesia Type:General  Level of Consciousness: awake, alert , oriented and patient cooperative  Airway and Oxygen Therapy: Patient Spontanous Breathing and Patient connected to nasal cannula oxygen  Post-op Pain: mild  Post-op Assessment: Post-op Vital signs reviewed, Patient's Cardiovascular Status Stable, Respiratory Function Stable, Patent Airway, No signs of Nausea or vomiting and Pain level controlled  Post-op Vital Signs: Reviewed and stable  Last Vitals:  Filed Vitals:   12/29/13 1350  BP: 134/100  Pulse: 78  Temp: 37.2 C  Resp: 18    Complications: No apparent anesthesia complications

## 2013-12-29 NOTE — Anesthesia Preprocedure Evaluation (Addendum)
Anesthesia Evaluation  Patient identified by MRN, date of birth, ID band Patient awake    Reviewed: Allergy & Precautions, H&P , NPO status , Patient's Chart, lab work & pertinent test results  Airway Mallampati: II  Neck ROM: full    Dental  (+) Dental Advidsory Given, Edentulous Upper, Edentulous Lower   Pulmonary former smoker,          Cardiovascular hypertension, + Peripheral Vascular Disease and DVT + dysrhythmias Atrial Fibrillation     Neuro/Psych  Neuromuscular disease    GI/Hepatic (+)     substance abuse  alcohol use and cocaine use,   Endo/Other  diabetes, Type 2, Insulin Dependent  Renal/GU      Musculoskeletal   Abdominal   Peds  Hematology   Anesthesia Other Findings   Reproductive/Obstetrics                          Anesthesia Physical Anesthesia Plan  ASA: III  Anesthesia Plan: General   Post-op Pain Management:    Induction: Intravenous  Airway Management Planned: LMA  Additional Equipment:   Intra-op Plan:   Post-operative Plan:   Informed Consent: I have reviewed the patients History and Physical, chart, labs and discussed the procedure including the risks, benefits and alternatives for the proposed anesthesia with the patient or authorized representative who has indicated his/her understanding and acceptance.   Dental Advisory Given  Plan Discussed with: CRNA, Anesthesiologist and Surgeon  Anesthesia Plan Comments:        Anesthesia Quick Evaluation

## 2013-12-29 NOTE — Op Note (Signed)
   Date of Surgery: 12/29/2013  INDICATIONS: Steven [name] is a 53 y.o.-year-old male who presents for transtibial amputation of the left lower extremity for left foot gangrene  The patient did consent to the procedure after discussion of the risks and benefits.  PREOPERATIVE DIAGNOSIS: left foot gangrene  POSTOPERATIVE DIAGNOSIS: Same.  PROCEDURE: left transtibial amputation CPT 27880  SURGEON: N. Eduard Roux, M.D.  ASSIST: none.  ANESTHESIA:  general  IV FLUIDS AND URINE: See anesthesia.  ESTIMATED BLOOD LOSS: 200 mL.  IMPLANTS: none  DRAINS: 1 hemovac  COMPLICATIONS: None.  DESCRIPTION OF PROCEDURE: The patient was brought to the operating room and placed supine on the operating table.  The patient had been signed prior to the procedure and this was documented. The patient had the anesthesia placed by the anesthesiologist.  A time-out was performed to confirm that this was the correct patient, site, side and location. The patient did receive antibiotics prior to the incision and was re-dosed during the procedure as needed at indicated intervals.  A nonsterile tourniquet was placed on the upper left thigh.  The patient had the operative extremity prepped and draped in the standard surgical fashion.    A fishmouth incision with a large posterior flap was created. Sharp dissection was taken down through the muscle compartments. The neurovascular structures were identified and ligated.  A tibial osteotomy was performed approximately 15 cm distal to the level of the knee joint. The fibula osteotomy was created approximately a centimeter proximal to the level of the tibial osteotomy. Once this was done the tourniquet was deflated. Hemostasis was obtained. The wound was thoroughly irrigated with 3 L of normal saline. A medium Hemovac was placed deep to the fascia. The wound was then closed in layer fashion using 0 PDS for the fascia, 2-0 Monocryl for the deep skin layer and 2-0 nylon for the skin.  A sterile dressing was applied. The patient awoke from anesthesia uneventfully and was taken to the PACU in stable condition  POSTOPERATIVE PLAN: The patient will be nonweightbearing to bilateral lower extremities. He'll be admitted to the orthopedic unit. The will undergo evaluation by physical therapy and will likely need placement in the comprehensive inpatient rehabilitation unit.    Azucena Cecil, MD [place] 9:49 AM

## 2013-12-29 NOTE — Transfer of Care (Signed)
Immediate Anesthesia Transfer of Care Note  Patient: Steven [name]  Procedure(s) Performed: Procedure(s): LEFT AMPUTATION BELOW KNEE (Left)  Patient Location: PACU  Anesthesia Type:General  Level of Consciousness: awake, alert  and oriented  Airway & Oxygen Therapy: Patient Spontanous Breathing and Patient connected to face mask oxygen  Post-op Assessment: Report given to PACU RN, Post -op Vital signs reviewed and stable and Patient moving all extremities X 4  Post vital signs: Reviewed and stable  Complications: No apparent anesthesia complications

## 2013-12-29 NOTE — Progress Notes (Signed)
Pt's cbg at 1744 was checked after pt had eaten.  He ate 100% of his food.  Dr. Zollie Beckers notified, orders received.  Nsg to continue to monitor.

## 2013-12-29 NOTE — Anesthesia Procedure Notes (Signed)
Procedure Name: LMA Insertion Date/Time: 12/29/2013 10:11 AM Performed by: Neldon Newport Pre-anesthesia Checklist: Timeout performed, Patient identified, Emergency Drugs available, Suction available and Patient being monitored Patient Re-evaluated:Patient Re-evaluated prior to inductionOxygen Delivery Method: Circle system utilized Preoxygenation: Pre-oxygenation with 100% oxygen Intubation Type: IV induction LMA: LMA inserted LMA Size: 5.0 Number of attempts: 1 Placement Confirmation: positive ETCO2 and breath sounds checked- equal and bilateral Tube secured with: Tape Dental Injury: Teeth and Oropharynx as per pre-operative assessment

## 2013-12-29 NOTE — H&P (Signed)
PREOPERATIVE H&P  Chief Complaint: Left foot gangrene  HPI: Steven [name] is a 53 y.o. male who presents for surgical treatment of Left foot gangrene.  He denies any changes in medical history.  Past Medical History  Diagnosis Date  . Uncontrolled diabetes mellitus   . HTN (hypertension)   . History of DVT (deep vein thrombosis)   . Personal history of diabetic foot ulcer   . Peripheral vascular disease     a. Abnl ABIs 2014.  Marland Kitchen Peripheral neuropathy 11/19/2011  . Varicose vein     legs  . Osteomyelitis of foot, right, acute 08/30/2012  . Personal history of colonic adenomas 05/29/2013  . History of cocaine use   . PAF (paroxysmal atrial fibrillation)     a. Dx 11/2013 in setting of sepsis/foot infection.  . Tobacco abuse   . H/O ETOH abuse   . Dysrhythmia     afib last admission  . Seasonal allergies    Past Surgical History  Procedure Laterality Date  . Amputation  04/21/2011    Procedure: AMPUTATION RAY;  Surgeon: Newt Minion, MD;  Location: [place];  Service: Orthopedics;  Laterality: Right;  Rt foot 2nd ray ampt  . I&d extremity  05/03/2011    Procedure: IRRIGATION AND DEBRIDEMENT EXTREMITY;  Surgeon: Newt Minion, MD;  Location: [place];  Service: Orthopedics;  Laterality: Right;  Irrigation and Debridement Right Foot and Place Acell Xenograft  . Toe amputation  04/24/2012    great toe   right foot  . Amputation  04/24/2012    Procedure: AMPUTATION RAY;  Surgeon: Newt Minion, MD;  Location: [place];  Service: Orthopedics;  Laterality: Right;  Right foot 1st ray amputation  . Amputation Left 04/23/2013    Procedure: AMPUTATION RAY;  Surgeon: Newt Minion, MD;  Location: [place];  Service: Orthopedics;  Laterality: Left;  Left Foot 5th Ray Amputation  . Colonoscopy    . Amputation Left 11/21/2013    Procedure: AMPUTATION RAY;  Surgeon: Newt Minion, MD;  Location: [place];  Service: Orthopedics;  Laterality: Left;  Left Foot 1st & 2nd Ray Amputation  . Amputation Right  12/04/2013    Procedure: AMPUTATION BELOW KNEE;  Surgeon: Marianna Payment, MD;  Location: [place];  Service: Orthopedics;  Laterality: Right;   History   Social History  . Marital Status: Single    Spouse Name: N/A    Number of Children: N/A  . Years of Education: N/A   Social History Main Topics  . Smoking status: Former Smoker -- 0.50 packs/day for 30 years    Types: Cigarettes  . Smokeless tobacco: Never Used  . Alcohol Use: No     Comment: t states he hs rank any alcholol rcently.  drinks off and on  . Drug Use: No     Comment: ast use of cocane an marjuana. denies  . Sexual Activity: Yes    Birth Control/ Protection: Injection     Comment: on patch   Other Topics Concern  . None   Social History Narrative  . None   Family History  Problem Relation Age of Onset  . Diabetes Mother   . Stroke Brother   . Colon cancer Neg Hx    No Known Allergies Prior to Admission medications   Medication Sig Start Date End Date Taking? Authorizing Provider  albuterol (PROVENTIL HFA;VENTOLIN HFA) 108 (90 BASE) MCG/ACT inhaler Inhale 2 puffs into the lungs every 6 (six) hours as  needed for wheezing or shortness of breath. 05/01/13   Leeanne Rio, MD  atorvastatin (LIPITOR) 40 MG tablet Take 1 tablet (40 mg total) by mouth daily. 12/18/13   Bary Leriche, PA-C  cetirizine (ZYRTEC) 10 MG tablet Take 1 tablet (10 mg total) by mouth daily. 05/01/13   Leeanne Rio, MD  diltiazem (CARDIZEM CD) 120 MG 24 hr capsule Take 1 capsule (120 mg total) by mouth daily. 12/18/13   Bary Leriche, PA-C  doxycycline (VIBRAMYCIN) 100 MG capsule Take 1 capsule (100 mg total) by mouth 2 (two) times daily. 12/18/13   Ivan Anchors Love, PA-C  fluticasone (FLONASE) 50 MCG/ACT nasal spray Place 2 sprays into both nostrils daily. 05/01/13   Leeanne Rio, MD  folic acid (FOLVITE) 1 MG tablet Take 1 tablet (1 mg total) by mouth daily. 12/18/13   Bary Leriche, PA-C  gabapentin (NEURONTIN) 300 MG capsule Take  1-2 capsules (300-600 mg total) by mouth 3 (three) times daily. Take 300mg  in the morning, take 300mg  at lunch, and take 600mg  QHS    Leeanne Rio, MD  hydrocortisone cream 1 % Apply 1 application topically 2 (two) times daily as needed for itching.    Historical Provider, MD  insulin glargine (LANTUS) 100 UNIT/ML injection Inject 0.3 mLs (30 Units total) into the skin at bedtime. 12/18/13   Ivan Anchors Love, PA-C  ketoconazole (NIZORAL) 2 % cream Apply topically daily. Between buttocks 12/18/13   Ivan Anchors Love, PA-C  lisinopril (PRINIVIL,ZESTRIL) 10 MG tablet Take 1 tablet (10 mg total) by mouth daily. For blood pressure 12/18/13   Ivan Anchors Love, PA-C  nicotine (NICODERM CQ - DOSED IN MG/24 HOURS) 14 mg/24hr patch Place 1 patch (14 mg total) onto the skin daily. 12/18/13   Bary Leriche, PA-C  nitroGLYCERIN (NITRODUR - DOSED IN MG/24 HR) 0.1 mg/hr patch Apply to left shin--change patch daily 12/18/13   Ivan Anchors Love, PA-C  oxyCODONE (ROXICODONE) 15 MG immediate release tablet Take 1 tablet (15 mg total) by mouth every 6 (six) hours as needed. 12/18/13   Ivan Anchors Love, PA-C  pantoprazole (PROTONIX) 40 MG tablet Take 1 tablet (40 mg total) by mouth at bedtime. 12/18/13   Bary Leriche, PA-C  rivaroxaban (XARELTO) 20 MG TABS tablet Take 1 tablet (20 mg total) by mouth daily with supper. For Atrial Fibrillation 12/18/13   Ivan Anchors Love, PA-C  senna-docusate (SENOKOT-S) 8.6-50 MG per tablet Take 2 tablets by mouth 2 (two) times daily. For constipation 12/18/13   Ivan Anchors Love, PA-C  silver sulfADIAZINE (SILVADENE) 1 % cream Apply topically daily. Cleanse left foot wound gently with normal saline. Apply thin smear of silvadene cream. Then cover with dry dressing. 12/19/13   Bary Leriche, PA-C  thiamine 100 MG tablet Take 1 tablet (100 mg total) by mouth daily. 12/18/13   Ivan Anchors Love, PA-C     Positive ROS: All other systems have been reviewed and were otherwise negative with the exception of those mentioned in the HPI  and as above.  Physical Exam: General: Alert, no acute distress Cardiovascular: No pedal edema Respiratory: No cyanosis, no use of accessory musculature GI: No organomegaly, abdomen is soft and non-tender Skin: No lesions in the area of chief complaint Neurologic: Sensation intact distally Psychiatric: Patient is competent for consent with normal mood and affect Lymphatic: No axillary or cervical lymphadenopathy  MUSCULOSKELETAL: exam unchanged from office visit  Assessment: Left foot gangrene  Plan: Plan for  Procedure(s): LEFT AMPUTATION BELOW KNEE  The risks benefits and alternatives were discussed with the patient including but not limited to the risks of nonoperative treatment, versus surgical intervention including infection, bleeding, nerve injury,  blood clots, cardiopulmonary complications, morbidity, mortality, among others, and they were willing to proceed.   Marianna Payment, MD   12/29/2013 7:22 AM

## 2013-12-30 ENCOUNTER — Encounter (HOSPITAL_COMMUNITY): Payer: Self-pay | Admitting: Orthopaedic Surgery

## 2013-12-30 LAB — GLUCOSE, CAPILLARY
Glucose-Capillary: 250 mg/dL — ABNORMAL HIGH (ref 70–99)
Glucose-Capillary: 253 mg/dL — ABNORMAL HIGH (ref 70–99)
Glucose-Capillary: 303 mg/dL — ABNORMAL HIGH (ref 70–99)
Glucose-Capillary: 234 mg/dL — ABNORMAL HIGH (ref 70–99)

## 2013-12-30 LAB — COMPREHENSIVE METABOLIC PANEL
ALBUMIN: 2 g/dL — AB (ref 3.5–5.2)
ALK PHOS: 115 U/L (ref 39–117)
ALT: 15 U/L (ref 0–53)
AST: 22 U/L (ref 0–37)
Anion gap: 9 (ref 5–15)
BUN: 9 mg/dL (ref 6–23)
CHLORIDE: 101 meq/L (ref 96–112)
CO2: 23 mEq/L (ref 19–32)
Calcium: 8.5 mg/dL (ref 8.4–10.5)
Creatinine, Ser: 0.69 mg/dL (ref 0.50–1.35)
GFR calc Af Amer: >90 mL/min (ref >90)
GFR calc non Af Amer: >90 mL/min (ref >90)
Glucose, Bld: 254 mg/dL — ABNORMAL HIGH (ref 70–99)
POTASSIUM: 4.4 meq/L (ref 3.7–5.3)
Sodium: 133 mEq/L — ABNORMAL LOW (ref 137–147)
Total Protein: 7.3 g/dL (ref 6.0–8.3)

## 2013-12-30 LAB — CBC
HEMATOCRIT: 26.7 % — AB (ref 39.0–52.0)
Hemoglobin: 8.5 g/dL — ABNORMAL LOW (ref 13.0–17.0)
MCH: 27.3 pg (ref 26.0–34.0)
MCHC: 31.8 g/dL (ref 30.0–36.0)
MCV: 85.9 fL (ref 78.0–100.0)
Platelets: 319 10*3/uL (ref 150–400)
RBC: 3.11 MIL/uL — ABNORMAL LOW (ref 4.22–5.81)
RDW: 14.4 % (ref 11.5–15.5)
WBC: 7.9 10*3/uL (ref 4.0–10.5)

## 2013-12-30 MED ORDER — OXYCODONE HCL 5 MG PO TABS: 5.0000 mg | ORAL_TABLET | ORAL | Status: DC | PRN

## 2013-12-30 MED ORDER — RIVAROXABAN 20 MG PO TABS: 20.0000 mg | ORAL_TABLET | Freq: Every day | ORAL | Status: DC

## 2013-12-30 NOTE — Evaluation (Signed)
Occupational Therapy Evaluation Patient Details Name: Steven [name] MRN: [id number] DOB: 08-Oct-1960 Today's Date: 12/30/2013    History of Present Illness left transtibial amputation. PMHx: RBKA, HTN, DM,PVD. ho/cocaine and ETOH abuse, tobacco abuse   Clinical Impression   This 53 yo male admitted and underwent above presents to acute OT with decreased mobility and acute pain both affecting pt's ability to care for himself. Pt will benefit from acute OT without need for followup.    Follow Up Recommendations  No OT follow up    Equipment Recommendations  None recommended by OT       Precautions / Restrictions Precautions Precautions: Fall Precaution Comments: Reviewed precautions and positioning for optimal healing Required Braces or Orthoses:  (ace wrap right residual limb; coban wrapping on left residual limb) Restrictions Weight Bearing Restrictions: Yes RLE Weight Bearing: Non weight bearing LLE Weight Bearing: Non weight bearing      Mobility Bed Mobility Overal bed mobility: Modified Independent (HOB up)                Transfers Overall transfer level: Needs assistance   Transfers:  (posterior scoot, Supervision)                Balance Overall balance assessment: Modified Independent                                          ADL Overall ADL's : Needs assistance/impaired Eating/Feeding: Independent;Sitting   Grooming: Set up;Sitting   Upper Body Bathing: Sitting;Set up   Lower Body Bathing: Set up;Supervison/ safety;Sitting/lateral leans   Upper Body Dressing : Set up;Sitting   Lower Body Dressing: Set up;Supervision/safety;Sitting/lateral leans   Toilet Transfer: Supervision/safety;Anterior/posterior (bed>recliner)   Toileting- Clothing Manipulation and Hygiene: Supervision/safety;Sitting/lateral lean                         Pertinent Vitals/Pain 9/10 LLE; pre-medicated and no more pain meds due until 11:30,  repositioned     Hand Dominance Right   Extremity/Trunk Assessment Upper Extremity Assessment Upper Extremity Assessment: Overall WFL for tasks assessed   Lower Extremity Assessment Lower Extremity Assessment: Defer to PT evaluation RLE Deficits / Details: Recent BKA.         Communication Communication Communication: No difficulties   Cognition Arousal/Alertness: Awake/alert Behavior During Therapy: WFL for tasks assessed/performed Overall Cognitive Status: Within Functional Limits for tasks assessed                                Home Living Family/patient expects to be discharged to:: Private residence Living Arrangements: Alone Available Help at Discharge: Friend(s);Available PRN/intermittently Type of Home: Apartment Home Access: Level entry     Home Layout: One level     Bathroom Shower/Tub: Tub/shower unit;Curtain   Biochemist, clinical: Standard Bathroom Accessibility: Yes How Accessible: Accessible via wheelchair;Accessible via walker Home Equipment: [place] - 2 wheels;Bedside commode;Cane - single point;Electric scooter      Lives With: Alone    Prior Functioning/Environment Level of Independence: Needs assistance  Gait / Transfers Assistance Needed: Prior to this amputation pt was using a RW and IT sales professional ADL's / [employer] Assistance Needed: Independent        OT Diagnosis: Generalized weakness;Acute pain   OT Problem List: Decreased range of motion;Pain   OT Treatment/Interventions: Self-care/ADL  training;Patient/family education;DME and/or AE instruction    OT Goals(Current goals can be found in the care plan section) Acute Rehab OT Goals Patient Stated Goal: talk to someone who has had both legs amputated OT Goal Formulation: With patient Time For Goal Achievement: 01/06/14 Potential to Achieve Goals: Good  OT Frequency: Min 2X/week              End of Session Nurse Communication:  (NT: anterior posterior transfers  with S)  Activity Tolerance: Patient tolerated treatment well Patient left: in chair;with call bell/phone within reach   Time: 0927-0950 OT Time Calculation (min): 23 min Charges:  OT General Charges $OT Visit: 1 Procedure OT Evaluation $Initial OT Evaluation Tier I: 1 Procedure OT Treatments $Self Care/Home Management : 8-22 mins  Almon Register [phone] 12/30/2013, 10:56 AM

## 2013-12-30 NOTE — Evaluation (Signed)
Physical Therapy Evaluation Patient Details Name: Steven [name] MRN: [id number] DOB: 09-16-1960 Today's Date: 12/30/2013   History of Present Illness  left transtibial amputation. PMHx: RBKA, HTN, DM,PVD. ho/cocaine and ETOH abuse, tobacco abuse  Clinical Impression  Patient is seen following the above procedure and presents with functional limitations due to the deficits listed below (see PT Problem List). Mobilizing generally well without physical assistance and is motivated to improve. States he would like to go home for therapy vs inpatient rehab. Patient will benefit from skilled PT to increase their independence and safety with mobility to allow discharge to the venue listed below.      Follow Up Recommendations Home health PT;Supervision - Intermittent    Equipment Recommendations  None recommended by PT    Recommendations for Other Services OT consult     Precautions / Restrictions Precautions Precautions: Fall Precaution Comments: Reviewed precautions and positioning for optimal healing Required Braces or Orthoses:  (ace wrap right residual limb; coban wrapping on left residual limb) Restrictions Weight Bearing Restrictions: Yes RLE Weight Bearing: Non weight bearing LLE Weight Bearing: Non weight bearing      Mobility  Bed Mobility Overal bed mobility: Modified Independent (HOB up)                Transfers Overall transfer level: Needs assistance Equipment used: None Transfers:  (posterior scoot, Supervision)       Anterior-Posterior transfers: Min guard  Lateral/Scoot Transfers: Min guard General transfer comment: Min guard for safety with VC for technique. Educated on safety with transfers from bed<>BSC using lateral scoot, and from bed<>chair using anterior posterior scoot. Some balance difficulty but able to self correct. Reports UE fatigue.  Ambulation/Gait                Stairs            Wheelchair Mobility    Modified Rankin  (Stroke Patients Only)       Balance Overall balance assessment: Modified Independent Sitting-balance support: No upper extremity supported Sitting balance-Leahy Scale: Fair                                       Pertinent Vitals/Pain Pt with no complaints of pain during therapy session Patient repositioned in chair for comfort.     Home Living Family/patient expects to be discharged to:: Private residence Living Arrangements: Alone Available Help at Discharge: Friend(s);Available PRN/intermittently Type of Home: Apartment Home Access: Level entry     Home Layout: One level Home Equipment: Walker - 2 wheels;Bedside commode;Cane - single point;Electric scooter      Prior Function Level of Independence: Needs assistance   Gait / Transfers Assistance Needed: Prior to this amputation pt was using a RW and IT sales professional  ADL's / [employer] Assistance Needed: Independent        Hand Dominance   Dominant Hand: Right    Extremity/Trunk Assessment   Upper Extremity Assessment: Overall WFL for tasks assessed           Lower Extremity Assessment: Defer to PT evaluation RLE Deficits / Details: Recent BKA. LLE Deficits / Details: NEW BKA     Communication   Communication: No difficulties  Cognition Arousal/Alertness: Awake/alert Behavior During Therapy: WFL for tasks assessed/performed Overall Cognitive Status: Within Functional Limits for tasks assessed  General Comments      Exercises Amputee Exercises Knee Flexion: AROM;Both;10 reps;Seated Knee Extension: AROM;Both;10 reps;Seated      Assessment/Plan    PT Assessment Patient needs continued PT services  PT Diagnosis Difficulty walking;Acute pain   PT Problem List Decreased strength;Decreased range of motion;Decreased activity tolerance;Decreased balance;Decreased mobility;Decreased knowledge of use of DME;Decreased knowledge of precautions;Pain  PT  Treatment Interventions DME instruction;Functional mobility training;Therapeutic activities;Therapeutic exercise;Balance training;Neuromuscular re-education;Patient/family education;Modalities   PT Goals (Current goals can be found in the Care Plan section) Acute Rehab PT Goals Patient Stated Goal: talk to someone who has had both legs amputated PT Goal Formulation: With patient Time For Goal Achievement: 01/06/14 Potential to Achieve Goals: Good    Frequency Min 3X/week   Barriers to discharge Decreased caregiver support Pt lives alone    Co-evaluation               End of Session   Activity Tolerance: Patient tolerated treatment well Patient left: in chair;with call bell/phone within reach Nurse Communication: Mobility status         Time: 5701-7793 PT Time Calculation (min): 25 min   Charges:   PT Evaluation $Initial PT Evaluation Tier I: 1 Procedure PT Treatments $Therapeutic Activity: 8-22 mins   PT G Codes:         Elayne Snare, [place]  Ellouise Newer 12/30/2013, 11:38 AM

## 2013-12-30 NOTE — Progress Notes (Signed)
Orthopedic Tech Progress Note Patient Details:  SAID RUEB [birth year] [id number]  Patient ID: Steven [name], male   DOB: 1961-04-16, 53 y.o.   MRN: [id number]   Irish Elders 12/30/2013, 2:27 PMTrapeze bar

## 2013-12-30 NOTE — Progress Notes (Signed)
Inpatient Diabetes Program Recommendations  AACE/ADA: New Consensus Statement on Inpatient Glycemic Control (2013)  Target Ranges:  Prepandial:   less than 140 mg/dL      Peak postprandial:   less than 180 mg/dL (1-2 hours)      Critically ill patients:  140 - 180 mg/dL   Reason for Assessment:Results for GILLERMO, POCH (MRN [id number]) as of 12/30/2013 14:36  Ref. Range 12/29/2013 12:12 12/29/2013 17:44 12/29/2013 21:22 12/30/2013 06:04 12/30/2013 11:37  Glucose-Capillary Latest Range: 70-99 mg/dL 245 (H) 403 (H) 174 (H) 250 (H) 253 (H)     Diabetes history: Type 2 diabetes Outpatient Diabetes medications: Lantus 30 units daily Current orders for Inpatient glycemic control: Lantus 30 units q HS, Novolog moderate tid with meals  May consider increasing Lantus to 36 units daily.  Also consider adding Novolog meal coverage 5 units tid with meals (hold if patient eats less than 50%).  Note elevated A1C in June, 2015-14.6%.  Needs improved glycemic control.  Will follow.   Thanks, Adah Perl, RN, BC-ADM Inpatient Diabetes Coordinator Pager [phone]

## 2013-12-30 NOTE — Progress Notes (Signed)
   Subjective:  Patient reports pain as mild.  No events.  Objective:   VITALS:   Filed Vitals:   12/29/13 1352 12/29/13 2030 12/30/13 0100 12/30/13 0540  BP:  139/77 153/78 148/80  Pulse:  82 74 86  Temp:  98.2 F (36.8 C) 97.9 F (36.6 C) 98 F (36.7 C)  TempSrc:   Oral   Resp:  16 18 16   Weight:      SpO2: 100% 100% 99% 99%    Neurologically intact Incision: dressing C/D/I and no drainage No cellulitis present Compartment soft   Lab Results  Component Value Date   WBC 7.9 12/30/2013   HGB 8.5* 12/30/2013   HCT 26.7* 12/30/2013   MCV 85.9 12/30/2013   PLT 319 12/30/2013     Assessment/Plan:  1 Day Post-Op   - Expected postop acute blood loss anemia - will monitor for symptoms - HVAC removed - Up with PT/OT - DVT ppx - SCDs, ambulation, xarelto at baseline for his afib - NWB bilateral lower extremity - Pain control - needs CIR  Marianna Payment 12/30/2013, 8:10 AM [phone]

## 2013-12-31 LAB — COMPREHENSIVE METABOLIC PANEL
ALT: 11 U/L (ref 0–53)
ANION GAP: 10 (ref 5–15)
AST: 16 U/L (ref 0–37)
Albumin: 2 g/dL — ABNORMAL LOW (ref 3.5–5.2)
Alkaline Phosphatase: 103 U/L (ref 39–117)
BUN: 8 mg/dL (ref 6–23)
CHLORIDE: 99 meq/L (ref 96–112)
CO2: 25 meq/L (ref 19–32)
Calcium: 8.5 mg/dL (ref 8.4–10.5)
Creatinine, Ser: 0.65 mg/dL (ref 0.50–1.35)
GFR calc non Af Amer: >90 mL/min (ref >90)
GLUCOSE: 244 mg/dL — AB (ref 70–99)
POTASSIUM: 4.3 meq/L (ref 3.7–5.3)
Sodium: 134 mEq/L — ABNORMAL LOW (ref 137–147)
Total Bilirubin: 0.2 mg/dL — ABNORMAL LOW (ref 0.3–1.2)
Total Protein: 7.2 g/dL (ref 6.0–8.3)

## 2013-12-31 LAB — GLUCOSE, CAPILLARY
Glucose-Capillary: 246 mg/dL — ABNORMAL HIGH (ref 70–99)
Glucose-Capillary: 283 mg/dL — ABNORMAL HIGH (ref 70–99)
Glucose-Capillary: 211 mg/dL — ABNORMAL HIGH (ref 70–99)
Glucose-Capillary: 155 mg/dL — ABNORMAL HIGH (ref 70–99)

## 2013-12-31 MED ORDER — SULFAMETHOXAZOLE-TMP DS 800-160 MG PO TABS
1.0000 | ORAL_TABLET | Freq: Two times a day (BID) | ORAL | Status: DC
  Administered 2013-12-31 – 2014-01-01 (×3): 1 via ORAL
  Filled 2013-12-31 (×6): qty 1

## 2013-12-31 MED ORDER — INSULIN GLARGINE 100 UNIT/ML ~~LOC~~ SOLN
36.0000 [IU] | Freq: Every day | SUBCUTANEOUS | Status: DC
  Administered 2013-12-31: 36 [IU] via SUBCUTANEOUS
  Filled 2013-12-31: qty 0.36

## 2013-12-31 MED ORDER — INSULIN ASPART 100 UNIT/ML ~~LOC~~ SOLN
0.0000 [IU] | Freq: Three times a day (TID) | SUBCUTANEOUS | Status: DC
  Administered 2013-12-31: 5 [IU] via SUBCUTANEOUS
  Administered 2013-12-31: 8 [IU] via SUBCUTANEOUS
  Administered 2014-01-01: 2 [IU] via SUBCUTANEOUS

## 2013-12-31 NOTE — Clinical Documentation Improvement (Signed)
Orthopedic MD's   A cause and effect relationship may not be assumed and must be documented by a provider.  Patient presenting with "left foot gangrene" with a history of PVD and Diabetes, please document the relationship, if any, between  Gangrene and Diabetes and /or Peripheral Vascular disease. Thank you    Are the conditions:   Due to or associated with each other   Other (please specify)   Unrelated to each other   Unable to determine   Unknown    Risk Factors: h/o Diabetes, PVD, HTN  Treatment: Left Transtibial Amputation  Thank you, Ree Kida ,RN Clinical Documentation Specialist:  [place] [employer]

## 2013-12-31 NOTE — Progress Notes (Signed)
[facility]  Patient Status: Active (receiving services up to time of hospitalization)  [facility] is providing the following services: RN and PT  If patient discharges after hours, please call [phone].   Consepcion Hearing 12/31/2013, 3:27 PM

## 2013-12-31 NOTE — Progress Notes (Signed)
Patient presented with left foot gangrene secondary to PVD and DM.

## 2013-12-31 NOTE — Care Management Note (Signed)
CARE MANAGEMENT NOTE 12/31/2013  Patient:  Steven [name],Steven [name]   Account Number:  [id number]  Date Initiated:  12/31/2013  Documentation initiated by:  Ricki Spoerl  Subjective/Objective Assessment:   53 yr old male s/p left BKA. 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 He is active with [facility] . Has wheelchair at home. States he has family support.   Anticipated DC Date:  01/01/2014   Anticipated DC Plan:  [place]  CM consult      [facility] Choice  Resumption Of Svcs/PTA Provider   Choice offered to / List presented to:  C-1 Patient        [facility] arranged  [place] RN      [place].   Status of service:  Completed, signed off Medicare Important Message given?   (If response is "NO", the following Medicare IM given date fields will be blank) Date Medicare IM given:   Medicare IM given by:   Date Additional Medicare IM given:   Additional Medicare IM given by:    Discharge Disposition:  [place]  Per UR Regulation:  Reviewed for med. necessity/level of care/duration of stay

## 2013-12-31 NOTE — Progress Notes (Signed)
Occupational Therapy Treatment Patient Details Name: Steven [name] MRN: [id number] DOB: 1960-12-02 Today's Date: 12/31/2013    History of present illness left transtibial amputation. PMHx: RBKA, HTN, DM,PVD. ho/cocaine and ETOH abuse, tobacco abuse   OT comments  Focus of session was on practicing a lateral transfer from the bed to the w/c. Educated pt on proper transfer technique both with and without the transfer board, and when it would benefit him to do an anterior posterior transfer to the [facility]. Pt would benefit from additional acute OT to practice a toilet transfer from the [place] w/c prior to D/C home alone. Will continue to follow.  Follow Up Recommendations  Other (comment) (home health aide)    Equipment Recommendations  None recommended by OT       Precautions / Restrictions Precautions Precautions: Fall Required Braces or Orthoses:  (ace wrap on R residual limb and coband on left) Restrictions Weight Bearing Restrictions: Yes RLE Weight Bearing: Non weight bearing LLE Weight Bearing: Non weight bearing       Mobility Bed Mobility Overal bed mobility: Modified Independent             General bed mobility comments: No cues or assist needed with HOB elevated.  Transfers Overall transfer level: Needs assistance   Transfers: Lateral/Scoot Transfers          Lateral/Scoot Transfers: Min guard General transfer comment: Min guard for safety from bed to w/c. Pt practiced transfer using transfer board but prefered doing it without it. Educated pt on anterior posterior transfer from bed to [facility] if needing to use the bathroom during the night.    Balance Overall balance assessment: Modified Independent                                 ADL Overall ADL's : Needs assistance/impaired                                     Functional mobility during ADLs: Wheelchair [place] w/c) General ADL Comments: Practiced lateral transfer from bed to  w/c, practicing with/without a transfer board. Pt will be using jazzy w/c for mobility at home and would benefit from addtional lateral and anterior/posterior transfer practice from [place] w/c to toilet and couch.                 Cognition   Behavior During Therapy: WFL for tasks assessed/performed Overall Cognitive Status: Within Functional Limits for tasks assessed                                    Pertinent Vitals/ Pain       Pt c/o pain in L residual limb. Ice was applied and pt was repositioned in bed at the end of tx.         Frequency Min 2X/week     Progress Toward Goals  OT Goals(current goals can now be found in the care plan section)  Progress towards OT goals: Progressing toward goals  Acute Rehab OT Goals Patient Stated Goal: to get around better OT Goal Formulation: With patient Time For Goal Achievement: 01/06/14 Potential to Achieve Goals: Good  Plan Discharge plan remains appropriate       End of Session Equipment Utilized During Treatment: Other (comment) (wheelchair)  Activity Tolerance Patient tolerated treatment well   Patient Left with call bell/phone within reach;in bed           Time:  -     Charges:    Lyda Perone 12/31/2013, 11:32 AM

## 2013-12-31 NOTE — Progress Notes (Signed)
Agree with note. [phone] 2 [place] 12/31/2013 Nestor Lewandowsky, OTR/L Pager: [phone]

## 2013-12-31 NOTE — Progress Notes (Signed)
Inpatient Diabetes Program Recommendations  AACE/ADA: New Consensus Statement on Inpatient Glycemic Control (2013)  Target Ranges:  Prepandial:   less than 140 mg/dL      Peak postprandial:   less than 180 mg/dL (1-2 hours)      Critically ill patients:  140 - 180 mg/dL   Reason for Assessment:  Attempted to speak with patient regarding his diabetes.  He states "It's doing good".  I asked him what his CBG's usually run and he states "200's".  Attempted to explain that this is high and he said "Do I need to eat more sugar?".  Discussed CHO and the effects of starches and sugar on  blood sugars.  Asked him about ordering him a book about his diabetes.  He states "I cannot read".  He says he does have a DVD player, so I will give him a DVD about diabetes management.  Needs Outpatient diabetes education for further follow-up.  He was unable to say who manages his diabetes, however I note that it is Orthoptist.  Needs very close support regarding his diabetes and titration of insulin.  Will order dietician consult also.    Will follow.  Adah Perl, RN, BC-ADM Inpatient Diabetes Coordinator Pager [phone]

## 2013-12-31 NOTE — Progress Notes (Signed)
   Subjective:  Patient reports pain as mild.  No events.  Objective:   VITALS:   Filed Vitals:   12/30/13 1443 12/30/13 1612 12/30/13 2101 12/31/13 0542  BP: 118/62  129/65 122/62  Pulse: 74  77 69  Temp: 98.5 F (36.9 C)  98.4 F (36.9 C) 98.2 F (36.8 C)  TempSrc: Oral  Oral Oral  Resp: 16  16 16   Height:  6\' 4"  (1.93 m)    Weight:  102.967 kg (227 lb)    SpO2: 99%  100% 98%    LLE incision c/d/i, no signs of infection RLE BKA wound with scant drainage, fibrinous slough   Lab Results  Component Value Date   WBC 7.9 12/30/2013   HGB 8.5* 12/30/2013   HCT 26.7* 12/30/2013   MCV 85.9 12/30/2013   PLT 319 12/30/2013     Assessment/Plan:  2 Days Post-Op   - bactrim DS BID for RLE wound - NWB BLE - up with PT - d/c home when able - dressings changed for both LEs   Marianna Payment 12/31/2013, 8:14 AM [phone]

## 2014-01-01 ENCOUNTER — Encounter: Payer: Self-pay | Admitting: *Deleted

## 2014-01-01 LAB — COMPREHENSIVE METABOLIC PANEL
ALBUMIN: 2.1 g/dL — AB (ref 3.5–5.2)
ALK PHOS: 90 U/L (ref 39–117)
ALT: 12 U/L (ref 0–53)
ANION GAP: 9 (ref 5–15)
AST: 19 U/L (ref 0–37)
BUN: 6 mg/dL (ref 6–23)
CHLORIDE: 101 meq/L (ref 96–112)
CO2: 27 mEq/L (ref 19–32)
Calcium: 8.9 mg/dL (ref 8.4–10.5)
Creatinine, Ser: 0.63 mg/dL (ref 0.50–1.35)
GFR calc Af Amer: >90 mL/min (ref >90)
GFR calc non Af Amer: >90 mL/min (ref >90)
GLUCOSE: 74 mg/dL (ref 70–99)
Potassium: 4.2 mEq/L (ref 3.7–5.3)
SODIUM: 137 meq/L (ref 137–147)
Total Protein: 7 g/dL (ref 6.0–8.3)

## 2014-01-01 LAB — GLUCOSE, CAPILLARY
Glucose-Capillary: 84 mg/dL (ref 70–99)
Glucose-Capillary: 121 mg/dL — ABNORMAL HIGH (ref 70–99)

## 2014-01-01 MED ORDER — SULFAMETHOXAZOLE-TMP DS 800-160 MG PO TABS: 1.0000 | ORAL_TABLET | Freq: Two times a day (BID) | ORAL | Status: DC

## 2014-01-01 MED ORDER — INSULIN GLARGINE 100 UNIT/ML ~~LOC~~ SOLN
34.0000 [IU] | Freq: Every day | SUBCUTANEOUS | Status: DC
  Filled 2014-01-01: qty 0.34

## 2014-01-01 MED ORDER — INSULIN GLARGINE 100 UNIT/ML ~~LOC~~ SOLN: 34.0000 [IU] | Freq: Every day | SUBCUTANEOUS | Status: DC

## 2014-01-01 NOTE — Discharge Summary (Signed)
Physician Discharge Summary      Patient ID: Steven [name] MRN: [id number] DOB/AGE: 55/20/1962 53 y.o.  Admit date: 12/29/2013 Discharge date: 01/01/2014  Admission Diagnoses:  Left foot ischemic ulcer, gangrene  Discharge Diagnoses:  Active Problems:   Gangrene of foot   Past Medical History  Diagnosis Date  . Uncontrolled diabetes mellitus   . HTN (hypertension)   . History of DVT (deep vein thrombosis)   . Personal history of diabetic foot ulcer   . Peripheral vascular disease     a. Abnl ABIs 2014.  Marland Kitchen Peripheral neuropathy 11/19/2011  . Varicose vein     legs  . Osteomyelitis of foot, right, acute 08/30/2012  . Personal history of colonic adenomas 05/29/2013  . History of cocaine use   . PAF (paroxysmal atrial fibrillation)     a. Dx 11/2013 in setting of sepsis/foot infection.  . Tobacco abuse   . H/O ETOH abuse   . Dysrhythmia     afib last admission  . 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 male who was admitted 12/29/2013 with a diagnosis of left foot ischemic ulcers and gangrene and went to the operating room on 12/29/2013 and underwent the above named procedures.    He was given perioperative antibiotics:      Anti-infectives   Start     Dose/Rate Route Frequency Ordered Stop   01/01/14 0000  sulfamethoxazole-trimethoprim (BACTRIM DS) 800-160 MG per tablet     1 tablet Oral 2 times daily 01/01/14 0804     12/31/13 1000  sulfamethoxazole-trimethoprim (BACTRIM DS) 800-160 MG per tablet 1 tablet  Status:  Discontinued     1 tablet Oral Every 12 hours 12/31/13 0813 01/01/14 1656   12/29/13 1600  ceFAZolin (ANCEF) IVPB 2 g/50 mL premix     2 g 100 mL/hr over 30 Minutes Intravenous Every 6 hours 12/29/13 1351 12/30/13 0406   12/29/13 0600  ceFAZolin (ANCEF) IVPB 2 g/50 mL premix     2 g 100 mL/hr over 30  Minutes Intravenous On call to O.R. 12/28/13 1357 12/29/13 1001    .  He was given sequential compression devices, early mobilization for DVT prophylaxis.  He benefited maximally from the hospital stay and there were no complications.  A diabetes coordinator was consulted for management of his diabetes and follow up was made.  His right below the knee amputation stump incision exhibited drainage which he was put on bactrim for.  He was able to pass physical therapy and was deemed appropriate for discharge to home.    Recent vital signs:  Filed Vitals:   12/31/13 2218  BP: 141/72  Pulse: 84  Temp: 98.6 F (37 C)  Resp: 16    Recent laboratory studies:  Lab Results  Component Value Date   HGB 8.5* 12/30/2013   HGB 10.8* 12/29/2013   HGB 9.7* 12/12/2013   Lab Results  Component Value Date   WBC 7.9 12/30/2013   PLT 319 12/30/2013   Lab Results  Component Value Date   INR 1.05 11/21/2013   Lab Results  Component Value Date   NA 137 01/01/2014   K 4.2 01/01/2014   CL 101 01/01/2014   CO2 27 01/01/2014   BUN 6 01/01/2014   CREATININE 0.63 01/01/2014   GLUCOSE 74 01/01/2014    Discharge Medications:  Medication List         albuterol 108 (90 BASE) MCG/ACT inhaler  Commonly known as:  PROVENTIL HFA;VENTOLIN HFA  Inhale 2 puffs into the lungs every 6 (six) hours as needed for wheezing or shortness of breath.     atorvastatin 40 MG tablet  Commonly known as:  LIPITOR  Take 1 tablet (40 mg total) by mouth daily.     cetirizine 10 MG tablet  Commonly known as:  ZYRTEC  Take 1 tablet (10 mg total) by mouth daily.     diltiazem 120 MG 24 hr capsule  Commonly known as:  CARDIZEM CD  Take 120 mg by mouth daily.     doxycycline 100 MG capsule  Commonly known as:  VIBRAMYCIN  Take 1 capsule (100 mg total) by mouth 2 (two) times daily.     fluticasone 50 MCG/ACT nasal spray  Commonly known as:  FLONASE  Place 2 sprays into both nostrils daily.     folic acid 1 MG tablet    Commonly known as:  FOLVITE  Take 1 tablet (1 mg total) by mouth daily.     gabapentin 300 MG capsule  Commonly known as:  NEURONTIN  Take 300-600 mg by mouth 3 (three) times daily. Takes 300mg  every morning, 300mg  every day at noon, and 600mg  every night at bedtime.     insulin glargine 100 UNIT/ML injection  Commonly known as:  LANTUS  Inject 0.34 mLs (34 Units total) into the skin at bedtime.     lisinopril 10 MG tablet  Commonly known as:  PRINIVIL,ZESTRIL  Take 1 tablet (10 mg total) by mouth daily. For blood pressure     nicotine 14 mg/24hr patch  Commonly known as:  NICODERM CQ - dosed in mg/24 hours  Place 1 patch (14 mg total) onto the skin daily.     nitroGLYCERIN 0.2 mg/hr patch  Commonly known as:  NITRODUR - Dosed in mg/24 hr  Place 0.2 mg onto the skin daily.     oxyCODONE 15 MG immediate release tablet  Commonly known as:  ROXICODONE  Take 15 mg by mouth every 6 (six) hours as needed for pain.     oxyCODONE 5 MG immediate release tablet  Commonly known as:  Oxy IR/ROXICODONE  Take 1-3 tablets (5-15 mg total) by mouth every 4 (four) hours as needed.     pantoprazole 40 MG tablet  Commonly known as:  PROTONIX  Take 1 tablet (40 mg total) by mouth at bedtime.     rivaroxaban 20 MG Tabs tablet  Commonly known as:  XARELTO  Take 1 tablet (20 mg total) by mouth daily with supper. For Atrial Fibrillation     senna-docusate 8.6-50 MG per tablet  Commonly known as:  Senokot-S  Take 2 tablets by mouth 2 (two) times daily. For constipation     silver sulfADIAZINE 1 % cream  Commonly known as:  SILVADENE  Apply topically daily. Cleanse left foot wound gently with normal saline. Apply thin smear of silvadene cream. Then cover with dry dressing.     sulfamethoxazole-trimethoprim 800-160 MG per tablet  Commonly known as:  BACTRIM DS  Take 1 tablet by mouth 2 (two) times daily.     thiamine 100 MG tablet  Take 1 tablet (100 mg total) by mouth daily.         Diagnostic Studies: Dg Chest 2 View  12/03/2013   CLINICAL DATA:  chest pain  EXAM: CHEST  2 VIEW  COMPARISON:  Two-view chest radiograph 11/21/2013  FINDINGS: Low lung volumes. The heart size and mediastinal contours are within normal limits. Both lungs are clear. The visualized skeletal structures are unremarkable.  IMPRESSION: No active cardiopulmonary disease.   Electronically Signed   By: Margaree Mackintosh M.D.   On: 12/03/2013 18:26   Dg Foot Complete Right  12/03/2013   CLINICAL DATA:  Pain and swelling.  EXAM: RIGHT FOOT COMPLETE - 3+ VIEW  COMPARISON:  Radiographs 08/30/2012 and MRI 03/07/2013.  FINDINGS: Diffuse hindfoot soft tissue swelling and air in the soft tissues worrisome for infection (gas gangrene). There is an open wound on the lateral aspect of the foot near the base of the fifth metatarsal. Findings suspicious for osteomyelitis.  IMPRESSION: Diffuse hindfoot swelling and gas in the soft tissues worrisome for infection (gas gangrene).  Open wound near the base of the fifth metatarsal with underlying osteomyelitis.   Electronically Signed   By: Kalman Jewels M.D.   On: 12/03/2013 18:25    Disposition: 01-Home or Self Care  Discharge Instructions   Ambulatory referral to Nutrition and Diabetic Education    Complete by:  As directed   Needs 1:1-Note patient cannot read.     Call MD / Call 911    Complete by:  As directed   If you experience chest pain or shortness of breath, CALL 911 and be transported to the hospital emergency room.  If you develope a fever above 101.5 F, pus (white drainage) or increased drainage or redness at the wound, or calf pain, call your surgeon's office.     Constipation Prevention    Complete by:  As directed   Drink plenty of fluids.  Prune juice may be helpful.  You may use a stool softener, such as Colace (over the counter) 100 mg twice a day.  Use MiraLax (over the counter) for constipation as needed.     Diet - low sodium heart healthy     Complete by:  As directed      Diet general    Complete by:  As directed      Driving restrictions    Complete by:  As directed   No driving while taking narcotic pain meds.     Increase activity slowly as tolerated    Complete by:  As directed      Non weight bearing    Complete by:  As directed            Follow-up Information   Follow up with Marianna Payment, MD In 1 week. (For suture removal, For wound re-check)    Specialty:  Orthopedic Surgery   Contact information:   [address] [place] [address] [phone]        Signed: Marianna Payment 01/01/2014, 8:32 PM

## 2014-01-01 NOTE — Progress Notes (Signed)
Occupational Therapy Treatment and Discharge Patient Details Name: Steven [name] MRN: [id number] DOB: December 26, 1960 Today's Date: 01/01/2014    History of present illness left transtibial amputation. PMHx: RBKA, HTN, DM,PVD. ho/cocaine and ETOH abuse, tobacco abuse   OT comments  Focus of session was to practice anterior-posterior and lateral transfers to maximize his independence at home. All education has been completed and goals have been met. Pt feels confident in self care tasks and verbalized understanding of how to set up his bathroom for optimal use. No further OT is needed, we will sign off.  Follow Up Recommendations  No OT follow up    Equipment Recommendations  None recommended by OT       Precautions / Restrictions Precautions Precautions: Fall Precaution Comments: Reviewed precautions and positioning for optimal healing Restrictions Weight Bearing Restrictions: Yes RLE Weight Bearing: Non weight bearing LLE Weight Bearing: Non weight bearing       Mobility Bed Mobility Overal bed mobility: Modified Independent             General bed mobility comments: No cues or assist needed  Transfers Overall transfer level: Modified independent Equipment used: None Transfers: Garment/textile technologist transfers: Modified independent (Device/Increase time)  Lateral/Scoot Transfers: Modified independent (Device/Increase time) General transfer comment: Mod I in this enviroment simulating home environment    Balance Overall balance assessment: Modified Independent   Sitting balance-Leahy Scale: Normal                             ADL Overall ADL's : Needs assistance/impaired                                 Tub/ Shower Transfer: Tub transfer;Modified independent;Tub bench (lateral transfer from stationary chair simulating Jazzy w/c with drop arm)   Functional mobility during ADLs:  Wheelchair [employer] w/c) General ADL Comments: Practiced anterior/posterior transfer from bed to recliner, lateral transfer from recliner to chair, and lateral transfer from chair to tub bench. Pt demonstrated understanding of transfer techniques and verbalized understanding of how to set up his bathroom for optimal use.                 Cognition   Behavior During Therapy: WFL for tasks assessed/performed Overall Cognitive Status: Within Functional Limits for tasks assessed                                    Pertinent Vitals/ Pain       No c/o pain during tx.         Frequency Min 2X/week     Progress Toward Goals  OT Goals(current goals can now be found in the care plan section)  Progress towards OT goals: Goals met/education completed, patient discharged from OT  Acute Rehab OT Goals Patient Stated Goal: to get around better OT Goal Formulation: With patient Time For Goal Achievement: 01/06/14 Potential to Achieve Goals: Good  Plan Discharge plan remains appropriate    Co-evaluation    PT/OT/SLP Co-Evaluation/Treatment: Yes Reason for Co-Treatment: For patient/therapist safety   OT goals addressed during session: Proper use of [employer] equipment and DME;ADL's and self-care         Activity Tolerance Patient tolerated treatment well   Patient Left with call bell/phone within  reach;with family/visitor present;in chair   Nurse Communication  (he is ready for D/C from OT standpoint)        Time:  -     Charges:    Lyda Perone 01/01/2014, 2:54 PM

## 2014-01-01 NOTE — Progress Notes (Signed)
I have read and agree with this note.   Time in/out: 1320-1343 Total time: 23 minutes ([facility] due to co-session with PT)  [place], OTR/L [phone]

## 2014-01-01 NOTE — Plan of Care (Addendum)
Problem: Not Ready for Diet/Lifestyle Change (NB-1.3) Goal: Nutrition education Formal process to instruct or train a patient/client in a skill or to impart knowledge to help patients/clients voluntarily manage or modify food choices and eating behavior to maintain or improve health. Outcome: Completed/Met Date Met:  01/01/14  RD consulted for nutrition education regarding diabetes.     Lab Results  Component Value Date    HGBA1C 14.6* 12/04/2013    RD provided "Plate Method" handout which is pictures of how the patient's plate should look. Pt became frustrated at the mention of education. Pt feels that he is being educated on things that he does not need. Pt states he was educated on how to get out of a shower and he feels he knows how to do this. Pt feels he knows how to eat and does not need additional education. Pt feels his blood sugars are fine at home. Lunch had just arrived and this RD tried to show pt how his meal looked similar to the pictures on the handout I gave him. Pt thanked me but did not want additional information at this time.  Noted that outpatient referral has been made.  Pt ready for d/c I was unable to add an education point in the Patient Education tab.   Expect poor compliance.  Body mass index is 27.64 kg/(m^2). Pt meets criteria for overweight based on current BMI.  Current diet order is CHO Modified/Heart Healthy, patient is consuming approximately 100% of meals at this time. Labs and medications reviewed. No further nutrition interventions warranted at this time. RD contact information provided. If additional nutrition issues arise, please re-consult RD.  [place], [place], [place] Pager [phone] After Hours Pager

## 2014-01-01 NOTE — Progress Notes (Addendum)
   Subjective:  Patient reports pain as mild.  No events.  Objective:   VITALS:   Filed Vitals:   12/31/13 0542 12/31/13 0935 12/31/13 1450 12/31/13 2218  BP: 122/62 121/67 126/65 141/72  Pulse: 69 77 82 84  Temp: 98.2 F (36.8 C)  98.5 F (36.9 C) 98.6 F (37 C)  TempSrc: Oral  Oral Oral  Resp: 16  16 16   Height:      Weight:      SpO2: 98%  100% 100%    LLE incision c/d/i, no signs of infection RLE BKA wound with scant drainage, fibrinous slough   Lab Results  Component Value Date   WBC 7.9 12/30/2013   HGB 8.5* 12/30/2013   HCT 26.7* 12/30/2013   MCV 85.9 12/30/2013   PLT 319 12/30/2013     Assessment/Plan:  3 Days Post-Op   - bactrim DS BID for RLE wound - NWB BLE - up with PT - may d/c home once home health PT and RN are set up - patient is noncompliant with his glucose control, glucose coordinator on board - appreciate assistance - needs close f/u with family practice for DM - appreciate case management help with this   Marianna Payment 01/01/2014, 8:02 AM [phone]

## 2014-01-01 NOTE — Progress Notes (Signed)
Pt provided with discharge instructions and follow up information. He is going home with resumed care of HHPT through [place]. His sister is taking him home at this time with no further questions.

## 2014-01-01 NOTE — Progress Notes (Signed)
Inpatient Diabetes Program Recommendations  AACE/ADA: New Consensus Statement on Inpatient Glycemic Control (2013)  Target Ranges:  Prepandial:   less than 140 mg/dL      Peak postprandial:   less than 180 mg/dL (1-2 hours)      Critically ill patients:  140 - 180 mg/dL   Reason for Visit:  Follow-up regarding diabetes management.  Took patient DVD from [facility] regarding diabetes management as he requested.  Also used "Zones"  To explain goal CBG ranges including Green (80-130, postprandial<180 mg/dL), Yellow (150-210 mg/dL) and Red (fastings greater than 200 or most CBG's greater than 210 mg/dL).  Patient was pleased with this tool and stated "I like this".  Reviewed with him and he was able to answer correctly with teach back.  He needs lots of support.  Note that home health is ordered.  Discussed with Case manager Manuela Schwartz, and she states that she will let them know to reinforce diabetes and Zone teaching.  He states that he givens insulin to himself using a vial and syringe.  Needs to follow-up with PCP regarding tighter control of CBG's.   Adah Perl, RN, BC-ADM Inpatient Diabetes Coordinator Pager [phone]

## 2014-01-01 NOTE — Progress Notes (Signed)
Physical Therapy Treatment Patient Details Name: Steven [name] MRN: [id number] DOB: [birth year] Today's Date: 01/01/2014    History of Present Illness left transtibial amputation. PMHx: RBKA, HTN, DM,PVD. ho/cocaine and ETOH abuse, tobacco abuse    PT Comments    Patient progressing well and eager to DC home home. Educated on safe transfers, HEP and general safety at home.   Follow Up Recommendations  Home health PT;Supervision - Intermittent     Equipment Recommendations       Recommendations for Other Services       Precautions / Restrictions Precautions Precautions: Fall Precaution Comments: Reviewed precautions and positioning for optimal healing Restrictions RLE Weight Bearing: Non weight bearing LLE Weight Bearing: Non weight bearing    Mobility  Bed Mobility Overal bed mobility: Modified Independent                Transfers Overall transfer level: Modified independent               General transfer comment: Mod I in this enviroment set up like home   Ambulation/Gait                 Stairs            Wheelchair Mobility    Modified Rankin (Stroke Patients Only)       Balance     Sitting balance-Leahy Scale: Normal                              Cognition Arousal/Alertness: Awake/alert Behavior During Therapy: WFL for tasks assessed/performed Overall Cognitive Status: Within Functional Limits for tasks assessed                      Exercises      General Comments        Pertinent Vitals/Pain no apparent distress     Home Living                      Prior Function            PT Goals (current goals can now be found in the care plan section) Progress towards PT goals: Progressing toward goals    Frequency  Min 3X/week    PT Plan Current plan remains appropriate    Co-evaluation             End of Session Equipment Utilized During Treatment: Gait belt Activity  Tolerance: Patient tolerated treatment well Patient left: in chair;with call bell/phone within reach     Time: 1320-1343 PT Time Calculation (min): 23 min  Charges:  $Therapeutic Activity: 8-22 mins                    G Codes:      Jacqualyn Posey 01/01/2014, 2:04 PM 01/01/2014 Jacqualyn Posey PTA [phone] pager [phone] office

## 2014-01-07 ENCOUNTER — Ambulatory Visit: Payer: Medicaid Other | Admitting: Family Medicine

## 2014-01-09 ENCOUNTER — Ambulatory Visit: Payer: Medicaid Other | Admitting: *Deleted

## 2014-01-15 ENCOUNTER — Telehealth: Payer: Self-pay | Admitting: Cardiology

## 2014-01-15 NOTE — Telephone Encounter (Signed)
Closed encounter °

## 2014-01-20 ENCOUNTER — Ambulatory Visit (INDEPENDENT_AMBULATORY_CARE_PROVIDER_SITE_OTHER): Payer: Medicaid Other | Admitting: Cardiology

## 2014-01-20 ENCOUNTER — Encounter: Payer: Self-pay | Admitting: Cardiology

## 2014-01-20 VITALS — BP 127/82 | HR 98

## 2014-01-20 DIAGNOSIS — I480 Paroxysmal atrial fibrillation: Secondary | ICD-10-CM

## 2014-01-20 DIAGNOSIS — I4891 Unspecified atrial fibrillation: Secondary | ICD-10-CM

## 2014-01-20 NOTE — Patient Instructions (Signed)
Wear your heart monitor for the next 48 hrs  Bring the monitor back when 48 hrs are up  Your physician recommends that you schedule a follow-up appointment in: as needed

## 2014-01-20 NOTE — Progress Notes (Signed)
HPI The patient presents as a new patient for me but he was previously seen by our service when he was hospitalized he's had bilateral below-the-knee indication one in June and one in July of this year for nonhealing ulcers. He was septic with osteomyelitis at one point in time. There was transient atrial fibrillation when we saw him at the height of his illness. However, he was in regular rhythm at the time of discharge. He was however started on Xarelto. Cardizem was used for rate control. He is now living at home. He gets around in a wheelchair. He denies any palpitations, presyncope or syncope. He denies any new shortness of breath PND or orthopnea.  He still smokes but is not drinking alcohol.  He is taking his medications per his report.    No Known Allergies  Current Outpatient Prescriptions  Medication Sig Dispense Refill  . albuterol (PROVENTIL HFA;VENTOLIN HFA) 108 (90 BASE) MCG/ACT inhaler Inhale 2 puffs into the lungs every 6 (six) hours as needed for wheezing or shortness of breath.  1 Inhaler  0  . atorvastatin (LIPITOR) 40 MG tablet Take 1 tablet (40 mg total) by mouth daily.  30 tablet  1  . cetirizine (ZYRTEC) 10 MG tablet Take 1 tablet (10 mg total) by mouth daily.  30 tablet  5  . diltiazem (CARDIZEM CD) 120 MG 24 hr capsule Take 120 mg by mouth daily.      Marland Kitchen doxycycline (VIBRAMYCIN) 100 MG capsule Take 1 capsule (100 mg total) by mouth 2 (two) times daily.  60 capsule  1  . fluticasone (FLONASE) 50 MCG/ACT nasal spray Place 2 sprays into both nostrils daily.  16 g  1  . folic acid (FOLVITE) 1 MG tablet Take 1 tablet (1 mg total) by mouth daily.  30 tablet  1  . gabapentin (NEURONTIN) 300 MG capsule Take 300-600 mg by mouth 3 (three) times daily. Takes 300mg  every morning, 300mg  every day at noon, and 600mg  every night at bedtime.      . insulin glargine (LANTUS) 100 UNIT/ML injection Inject 0.34 mLs (34 Units total) into the skin at bedtime.  10 mL  11  . lisinopril  (PRINIVIL,ZESTRIL) 10 MG tablet Take 1 tablet (10 mg total) by mouth daily. For blood pressure  30 tablet  1  . nicotine (NICODERM CQ - DOSED IN MG/24 HOURS) 14 mg/24hr patch Place 1 patch (14 mg total) onto the skin daily.  28 patch  0  . nitroGLYCERIN (NITRODUR - DOSED IN MG/24 HR) 0.2 mg/hr patch Place 0.2 mg onto the skin daily.      Marland Kitchen oxyCODONE (OXY IR/ROXICODONE) 5 MG immediate release tablet Take 1-3 tablets (5-15 mg total) by mouth every 4 (four) hours as needed.  90 tablet  0  . oxyCODONE (ROXICODONE) 15 MG immediate release tablet Take 15 mg by mouth every 6 (six) hours as needed for pain.      . pantoprazole (PROTONIX) 40 MG tablet Take 1 tablet (40 mg total) by mouth at bedtime.  30 tablet  1  . rivaroxaban (XARELTO) 20 MG TABS tablet Take 1 tablet (20 mg total) by mouth daily with supper. For Atrial Fibrillation  30 tablet  1  . senna-docusate (SENOKOT-S) 8.6-50 MG per tablet Take 2 tablets by mouth 2 (two) times daily. For constipation  120 tablet  0  . silver sulfADIAZINE (SILVADENE) 1 % cream Apply topically daily. Cleanse left foot wound gently with normal saline. Apply thin smear of silvadene  cream. Then cover with dry dressing.  50 g  1  . sulfamethoxazole-trimethoprim (BACTRIM DS) 800-160 MG per tablet Take 1 tablet by mouth 2 (two) times daily.  28 tablet  0  . thiamine 100 MG tablet Take 1 tablet (100 mg total) by mouth daily.  30 tablet  1   No current facility-administered medications for this visit.    Past Medical History  Diagnosis Date  . Uncontrolled diabetes mellitus   . HTN (hypertension)   . History of DVT (deep vein thrombosis)   . Personal history of diabetic foot ulcer   . Peripheral vascular disease     a. Abnl ABIs 2014.  Marland Kitchen Peripheral neuropathy 11/19/2011  . Varicose vein     legs  . Osteomyelitis of foot, right, acute 08/30/2012  . Personal history of colonic adenomas 05/29/2013  . History of cocaine use   . PAF (paroxysmal atrial fibrillation)     a.  Dx 11/2013 in setting of sepsis/foot infection.  . Tobacco abuse   . H/O ETOH abuse   . Dysrhythmia     afib last admission  . Seasonal allergies     Past Surgical History  Procedure Laterality Date  . Amputation  04/21/2011    Procedure: AMPUTATION RAY;  Surgeon: Newt Minion, MD;  Location: [facility];  Service: Orthopedics;  Laterality: Right;  Rt foot 2nd ray ampt  . I&d extremity  05/03/2011    Procedure: IRRIGATION AND DEBRIDEMENT EXTREMITY;  Surgeon: Newt Minion, MD;  Location: [facility];  Service: Orthopedics;  Laterality: Right;  Irrigation and Debridement Right Foot and Place Acell Xenograft  . Toe amputation  04/24/2012    great toe   right foot  . Amputation  04/24/2012    Procedure: AMPUTATION RAY;  Surgeon: Newt Minion, MD;  Location: [facility];  Service: Orthopedics;  Laterality: Right;  Right foot 1st ray amputation  . Amputation Left 04/23/2013    Procedure: AMPUTATION RAY;  Surgeon: Newt Minion, MD;  Location: [facility];  Service: Orthopedics;  Laterality: Left;  Left Foot 5th Ray Amputation  . Colonoscopy    . Amputation Left 11/21/2013    Procedure: AMPUTATION RAY;  Surgeon: Newt Minion, MD;  Location: [facility];  Service: Orthopedics;  Laterality: Left;  Left Foot 1st & 2nd Ray Amputation  . Amputation Right 12/04/2013    Procedure: AMPUTATION BELOW KNEE;  Surgeon: Marianna Payment, MD;  Location: [facility];  Service: Orthopedics;  Laterality: Right;  . Amputation Left 12/29/2013    Procedure: LEFT AMPUTATION BELOW KNEE;  Surgeon: Marianna Payment, MD;  Location: [facility];  Service: Orthopedics;  Laterality: Left;    ROS:  As stated in the HPI and negative for all other systems.  PHYSICAL EXAM BP 127/82  Pulse 98 GENERAL:  Well appearing HEENT:  Pupils equal round and reactive, fundi not visualized, oral mucosa unremarkable NECK:  No jugular venous distention, waveform within normal limits, carotid upstroke brisk and symmetric, no bruits, no thyromegaly LYMPHATICS:  No cervical,  inguinal adenopathy LUNGS:  Clear to auscultation bilaterally BACK:  No CVA tenderness CHEST:  Unremarkable HEART:  PMI not displaced or sustained,S1 and S2 within normal limits, no S3, no S4, no clicks, no rubs, no murmurs ABD:  Flat, positive bowel sounds normal in frequency in pitch, no bruits, no rebound, no guarding, no midline pulsatile mass, no hepatomegaly, no splenomegaly EXT:  2 plus pulses upper, s/p bilateral BKA SKIN:  No rashes no nodules  NEURO:  Cranial nerves II through XII grossly intact, motor grossly intact throughout PSYCH:  Cognitively intact, oriented to person place and time, angry   ASSESSMENT AND PLAN   ATRIAL FIB:  This appears to be the reason for the office visit.  I was able to review hospital records and this was noted when he was septic in the hospital at the time of his first amputation.  I did manage to find a consult note done in our service at that time. They did think that his stroke risk was high but was also cleared his fibrillation was related to his complicated hospital course.  Given this I would not continue Xarelto unless he has continued documented atrial fibrillation. I will place a 48 hour Holter and if there is no evidence of fibrillation he discontinue this medication.  TOBACCO ABUSE:  He is not able to quit smoking.  PVD:  Per Chrisandra Netters, MD.  He needs aggressive risk reduction.  If we stop Xarelto he will need to restart ASA.

## 2014-01-22 ENCOUNTER — Encounter: Payer: Self-pay | Admitting: Family Medicine

## 2014-01-22 ENCOUNTER — Ambulatory Visit (INDEPENDENT_AMBULATORY_CARE_PROVIDER_SITE_OTHER): Payer: Medicaid Other | Admitting: Family Medicine

## 2014-01-22 VITALS — BP 144/97 | HR 84 | Temp 98.8°F

## 2014-01-22 DIAGNOSIS — E1165 Type 2 diabetes mellitus with hyperglycemia: Secondary | ICD-10-CM

## 2014-01-22 DIAGNOSIS — Z89511 Acquired absence of right leg below knee: Secondary | ICD-10-CM | POA: Insufficient documentation

## 2014-01-22 DIAGNOSIS — IMO0001 Reserved for inherently not codable concepts without codable children: Secondary | ICD-10-CM

## 2014-01-22 DIAGNOSIS — Z89512 Acquired absence of left leg below knee: Secondary | ICD-10-CM

## 2014-01-22 DIAGNOSIS — IMO0002 Reserved for concepts with insufficient information to code with codable children: Secondary | ICD-10-CM

## 2014-01-22 DIAGNOSIS — S88119A Complete traumatic amputation at level between knee and ankle, unspecified lower leg, initial encounter: Secondary | ICD-10-CM

## 2014-01-22 MED ORDER — TRAMADOL HCL 50 MG PO TABS: 50.0000 mg | ORAL_TABLET | Freq: Three times a day (TID) | ORAL | Status: DC | PRN

## 2014-01-22 NOTE — Patient Instructions (Signed)
Check your blood sugar at least every morning before you eat anything. Return in 2-3 weeks so we can adjust your insulin.  Ask the orthopedic doctor if you are supposed to still be on the doxycycline.  Call your eye doctor to set up an appointment.  I am prescribing tramadol for the pain in your legs.   Be well, Dr. [name]

## 2014-01-22 NOTE — Assessment & Plan Note (Signed)
Remains poorly controlled, unable to titrate lantus as he is not checking sugars consistently or recording them. I printed off a CBG log again for him to fill out and asked him to f/u in 2 weeks with his log so that we can adjust his insulin.  Cardiac: on statin, aspirin Renal: on ACE Eye: again reminded him to schedule an appt with his eye doctor Foot: n/a - now has bilateral BKA

## 2014-01-22 NOTE — Assessment & Plan Note (Signed)
Have asked him to speak with his orthopedic doctor when he follows up in 5 days about whether he needs to continue on the doxycycline since he has now had both legs amputated below the knee.

## 2014-01-22 NOTE — Progress Notes (Signed)
Patient ID: Steven [name], male   DOB: 1960/10/13, 53 y.o.   MRN: [id number]  HPI:  DM: currently taking lantus 34 units each night. Does not write down his CBG's, is not able to accurately recall. Due for eye exam.  Recently underwent bilateral BKA's for osteomyelitis. He still has doxycycline in his medication bag and states he is still taking it. It is unclear to me if he is still supposed to be on this. Has ortho follow up scheduled in 5 days on August 18. He has pains in his legs and shoulders since having the surgery. Now is out of pain medicine.  Had atrial fibrillation while septic in the hospital, currently undergoing holter monitor study to evaluate if he still needs to be on xarelto, this is being done by cardiology.  ROS: See HPI.  [place]: hx T2DM, diabetic retinopathy, peripheral vascular disease, HTN  PHYSICAL EXAM: BP 144/97  Pulse 84  Temp(Src) 98.8 F (37.1 C) (Oral) Gen: NAD, in motorized wheelchair HEENT: NCAT Heart: RRR Lungs: CTAB NWOB Neuro: grossly nonfocal speech normal Ext: bilat lower ext with BKA's, wrapped in ACE bandages.  ASSESSMENT/PLAN:  S/P bilateral BKA (below knee amputation) Have asked him to speak with his orthopedic doctor when he follows up in 5 days about whether he needs to continue on the doxycycline since he has now had both legs amputated below the knee.  Diabetes mellitus type II, uncontrolled Remains poorly controlled, unable to titrate lantus as he is not checking sugars consistently or recording them. I printed off a CBG log again for him to fill out and asked him to f/u in 2 weeks with his log so that we can adjust his insulin.  Cardiac: on statin, aspirin Renal: on ACE Eye: again reminded him to schedule an appt with his eye doctor Foot: n/a - now has bilateral BKA    FOLLOW UP: F/u in 2 weeks for titration of lantus.  [place]. [name], [place]

## 2014-02-11 ENCOUNTER — Telehealth: Payer: Self-pay | Admitting: Family Medicine

## 2014-02-11 NOTE — Telephone Encounter (Signed)
Pt requesting more hours of PCS due to having both legs amputated. Pls advise.

## 2014-02-12 ENCOUNTER — Telehealth: Payer: Self-pay | Admitting: Family Medicine

## 2014-02-12 NOTE — Telephone Encounter (Signed)
Home health nurse called and wanted the doctor to know that on her last few visits patient BP has been running high. 160/100 and 170/100. jw

## 2014-02-12 NOTE — Telephone Encounter (Signed)
CSW has placed PCS form in PCP's box. CSW to fax form to [employer] once completed.  Hunt Oris, MSW, [place]

## 2014-02-12 NOTE — Telephone Encounter (Signed)
Noted. Pt has upcoming appt scheduled with me. We will check his BP at that visit and make changes if necessary.  Leeanne Rio, MD

## 2014-02-17 ENCOUNTER — Encounter: Payer: Self-pay | Admitting: Family Medicine

## 2014-02-17 ENCOUNTER — Ambulatory Visit (INDEPENDENT_AMBULATORY_CARE_PROVIDER_SITE_OTHER): Payer: Medicaid Other | Admitting: Family Medicine

## 2014-02-17 VITALS — BP 123/80 | HR 91 | Temp 98.5°F

## 2014-02-17 DIAGNOSIS — IMO0002 Reserved for concepts with insufficient information to code with codable children: Secondary | ICD-10-CM

## 2014-02-17 DIAGNOSIS — Z89511 Acquired absence of right leg below knee: Secondary | ICD-10-CM

## 2014-02-17 DIAGNOSIS — S88119A Complete traumatic amputation at level between knee and ankle, unspecified lower leg, initial encounter: Secondary | ICD-10-CM

## 2014-02-17 DIAGNOSIS — E1165 Type 2 diabetes mellitus with hyperglycemia: Secondary | ICD-10-CM

## 2014-02-17 DIAGNOSIS — IMO0001 Reserved for inherently not codable concepts without codable children: Secondary | ICD-10-CM

## 2014-02-17 DIAGNOSIS — Z89512 Acquired absence of left leg below knee: Secondary | ICD-10-CM

## 2014-02-17 NOTE — Assessment & Plan Note (Signed)
Will complete [employer] transportation forms as well as personal care service request form and fax back to their designated places. Pt gave me permission to do this today.

## 2014-02-17 NOTE — Progress Notes (Signed)
Patient ID: Steven [name], male   DOB: 1961/05/02, 53 y.o.   MRN: [id number]  HPI:  Diabetes: gets 100-120 in the morning. In the evenings gets 200's. Never feels sweaty or dizzy. Has filled out the CBG log I gave him but forgot to bring it with him. He is taking lantus 34 units at night. Does not do any mealtime coverage.   He also needs me to complete [employer] request forms and personal care services request.   ROS: See HPI.  [place]: hx T2DM, HTN, HLD, BKA, cocaine abuse, afib  PHYSICAL EXAM: BP 123/80  Pulse 91  Temp(Src) 98.5 F (36.9 C) (Oral) Gen: NAD, pleasant, cooperative HEENT: NCAT Heart: RRR no murmurs Lungs: CTAB, NWOB Neuro: grossly nonfocal speech normal Ext: bilat BKA  ASSESSMENT/PLAN:  Diabetes mellitus type II, uncontrolled Again unable to titrate insulin as he has not brought his CBG log with him. In order to prevent him from having to return for an office visit, I have advised him to call us tomorrow with his CBG log readings. I will then make recommendations over the phone for how to adjust his insulin. Given that he has fasting sugars reported of being 100-120 I cannot simply increase his Lantus. Suspect he will need mealtime coverage but need to see what his sugars are doing first. Follow up in clinic in 1 month, by phone sooner.  S/P bilateral BKA (below knee amputation) Will complete [employer] transportation forms as well as personal care service request form and fax back to their designated places. Pt gave me permission to do this today.   FOLLOW UP: F/u in 1 month for diabetes, sooner by phone.  [place]. Steven [name], [place]

## 2014-02-17 NOTE — Patient Instructions (Signed)
It was great to see you again today!   Call us with your blood sugar chart readings and we can adjust your insulin based on those numbers.  Return in 1 month for a follow up visit with me. I will fill out your paperwork for personal care services.  Be well, Dr. [name]

## 2014-02-17 NOTE — Assessment & Plan Note (Addendum)
Again unable to titrate insulin as he has not brought his CBG log with him. In order to prevent him from having to return for an office visit, I have advised him to call us tomorrow with his CBG log readings. I will then make recommendations over the phone for how to adjust his insulin. Given that he has fasting sugars reported of being 100-120 I cannot simply increase his Lantus. Suspect he will need mealtime coverage but need to see what his sugars are doing first. Follow up in clinic in 1 month, by phone sooner.

## 2014-02-18 ENCOUNTER — Encounter: Payer: Medicaid Other | Attending: Physical Medicine & Rehabilitation | Admitting: Physical Medicine & Rehabilitation

## 2014-02-18 ENCOUNTER — Telehealth: Payer: Self-pay | Admitting: Family Medicine

## 2014-02-18 NOTE — Telephone Encounter (Signed)
Pt called and was told to call us with his BP reading this morning it was 113 and he didn't get the bottom number . He will call back at lunch with the next one.

## 2014-02-27 NOTE — Telephone Encounter (Signed)
Form completed and placed in Norma's box. Leeanne Rio, MD

## 2014-03-05 ENCOUNTER — Encounter: Payer: Self-pay | Admitting: Clinical

## 2014-03-05 NOTE — Progress Notes (Signed)
CSW faxed over PCS form to [employer].  Hunt Oris, MSW, [place]

## 2014-03-10 ENCOUNTER — Telehealth: Payer: Self-pay | Admitting: Family Medicine

## 2014-03-10 MED ORDER — INSULIN GLARGINE 100 UNIT/ML ~~LOC~~ SOLN: 34.0000 [IU] | Freq: Every day | SUBCUTANEOUS | Status: DC

## 2014-03-10 NOTE — Telephone Encounter (Signed)
Rx sent in. Please also call patient and encourage him to call in with his blood sugars so we can adjust his insulin over the phone, as discussed during his last office visit.  Thanks! Leeanne Rio, MD

## 2014-03-10 NOTE — Telephone Encounter (Signed)
Danielle at [facility] says pt needs refill on Lantus. He is completely out

## 2014-03-11 NOTE — Telephone Encounter (Signed)
Tried calling patient, line busy. Will attempt again later.

## 2014-03-12 NOTE — Telephone Encounter (Signed)
Tried calling again, received message that line had been disconnected.

## 2014-03-23 ENCOUNTER — Encounter: Payer: Self-pay | Admitting: Family Medicine

## 2014-03-23 ENCOUNTER — Ambulatory Visit (INDEPENDENT_AMBULATORY_CARE_PROVIDER_SITE_OTHER): Payer: Medicaid Other | Admitting: Family Medicine

## 2014-03-23 VITALS — BP 147/72 | HR 86 | Temp 97.5°F

## 2014-03-23 DIAGNOSIS — I480 Paroxysmal atrial fibrillation: Secondary | ICD-10-CM

## 2014-03-23 DIAGNOSIS — I4891 Unspecified atrial fibrillation: Secondary | ICD-10-CM

## 2014-03-23 DIAGNOSIS — R21 Rash and other nonspecific skin eruption: Secondary | ICD-10-CM

## 2014-03-23 DIAGNOSIS — E1165 Type 2 diabetes mellitus with hyperglycemia: Secondary | ICD-10-CM

## 2014-03-23 DIAGNOSIS — L700 Acne vulgaris: Secondary | ICD-10-CM

## 2014-03-23 DIAGNOSIS — IMO0002 Reserved for concepts with insufficient information to code with codable children: Secondary | ICD-10-CM

## 2014-03-23 LAB — CBC
HEMATOCRIT: 43.1 % (ref 39.0–52.0)
HEMOGLOBIN: 14.1 g/dL (ref 13.0–17.0)
MCH: 26.7 pg (ref 26.0–34.0)
MCHC: 32.7 g/dL (ref 30.0–36.0)
MCV: 81.6 fL (ref 78.0–100.0)
Platelets: 233 10*3/uL (ref 150–400)
RBC: 5.28 MIL/uL (ref 4.22–5.81)
RDW: 16.2 % — ABNORMAL HIGH (ref 11.5–15.5)
WBC: 5 10*3/uL (ref 4.0–10.5)

## 2014-03-23 LAB — POCT GLYCOSYLATED HEMOGLOBIN (HGB A1C): Hemoglobin A1C: 9.1

## 2014-03-23 MED ORDER — INSULIN GLARGINE 100 UNIT/ML ~~LOC~~ SOLN: 37.0000 [IU] | Freq: Every day | SUBCUTANEOUS | Status: DC

## 2014-03-23 NOTE — Patient Instructions (Signed)
It was great to see you again today!  For diabetes: -keep up the GREAT work with checking your sugars!! I'm proud of you!! -increase lantus to 37 units daily -call the office in 3-4 weeks or come in for a visit to see how your sugars are doing  For rash: -checking bloodwork -try topical over the counter benzoyl peroxide, an acne medicine  For hand throbbing -use tylenol as needed -ice, stretches, exercises with squeeze ball -return if not improving  Be well, Dr. [name]

## 2014-03-23 NOTE — Progress Notes (Signed)
Patient ID: Steven [name], male   DOB: 14-Mar-1961, 53 y.o.   MRN: [id number]  HPI:  Hands throbbing in the morning - lasts for several hours. Not stiffness, just throbbing. Does not take tylenol. Has gone on for about 1.5 weeks.  Bumps on back - has had these for a while. Sometimes has pustules that pop. Does not use any medicine. Not able to reach his back very well.  Diabetes - brought CBG chart with him today! Gets 140's-150's fasting typically, usually 200's around lunch & dinner. Currently using lantus 34 units daily.  ROS: See HPI.  [place]: hx T2DM, HTN, HLD, afib, PVD, alcohol & cocaine abuse  PHYSICAL EXAM: BP 147/72  Pulse 86  Temp(Src) 97.5 F (36.4 C) (Oral) Gen: NAD, in motorized wheelchair HEENT: NCAT Heart: RRR Lungs: CTAB Neuro: grossly nonfocal speech normal Ext: s/p bilat BKA. bilat hands without obvious deformation, injury, erythema, swelling. Skin: acneiform rash to back with open and closed comedones, also some mild hyperpigmentation/post-scar changes  ASSESSMENT/PLAN:  Hand throbbing: recent onset. No swelling to suggest rheumatological cause. Will start conservative therapy with tylenol, ice, stretches/squeeze ball. F/u if not improving.   Diabetes mellitus type II, uncontrolled Check A1c today. Given fasting CBG's of 140-150 range, will increase lantus to 37 units daily. F/u by phone or in office in 3-4 weeks for continued insulin titration. Praised him for bringing in his CBG record.  Cardiac: on aspirin, statin Renal: on ACE Eye: address at future visit Foot: n/a bilateral BKA   Atrial fibrillation with RVR Currently rate controlled. Check CBC today to monitor while on xarelto.  Acne Bumps on back appear to be acne. Will also check HIV and RPR to rule out infectious causes. Recommend topical benzoyl peroxide, may have to use long handed brush to apply.   FOLLOW UP: F/u in 3-4 weeks by phone or in office for further titration of insulin  [place]  J. [name], [place]

## 2014-03-24 LAB — HIV ANTIBODY (ROUTINE TESTING W REFLEX): HIV: NONREACTIVE

## 2014-03-24 LAB — RPR

## 2014-03-30 ENCOUNTER — Telehealth: Payer: Self-pay | Admitting: Family Medicine

## 2014-03-30 DIAGNOSIS — L709 Acne, unspecified: Secondary | ICD-10-CM | POA: Insufficient documentation

## 2014-03-30 NOTE — Assessment & Plan Note (Signed)
Currently rate controlled. Check CBC today to monitor while on xarelto.

## 2014-03-30 NOTE — Telephone Encounter (Signed)
Pt is checking status of rx for his bp and cholesterol, says he left the bottles at the pharmacy so could not give me the specific name, pt goes to [facility]

## 2014-03-30 NOTE — Assessment & Plan Note (Signed)
Check A1c today. Given fasting CBG's of 140-150 range, will increase lantus to 37 units daily. F/u by phone or in office in 3-4 weeks for continued insulin titration. Praised him for bringing in his CBG record.  Cardiac: on aspirin, statin Renal: on ACE Eye: address at future visit Foot: n/a bilateral BKA

## 2014-03-30 NOTE — Assessment & Plan Note (Signed)
Bumps on back appear to be acne. Will also check HIV and RPR to rule out infectious causes. Recommend topical benzoyl peroxide, may have to use long handed brush to apply.

## 2014-03-31 ENCOUNTER — Encounter: Payer: Self-pay | Admitting: Internal Medicine

## 2014-03-31 MED ORDER — ATORVASTATIN CALCIUM 40 MG PO TABS: 40.0000 mg | ORAL_TABLET | Freq: Every day | ORAL | Status: AC

## 2014-03-31 MED ORDER — LISINOPRIL 10 MG PO TABS: 10.0000 mg | ORAL_TABLET | Freq: Every day | ORAL | Status: DC

## 2014-03-31 NOTE — Telephone Encounter (Signed)
Home Health Nurse called and wanted the doctor to know that the patients BP was 166/100 and he is out if his BP medication. jw

## 2014-04-22 ENCOUNTER — Ambulatory Visit (INDEPENDENT_AMBULATORY_CARE_PROVIDER_SITE_OTHER): Payer: Medicaid Other | Admitting: Family Medicine

## 2014-04-22 ENCOUNTER — Ambulatory Visit (INDEPENDENT_AMBULATORY_CARE_PROVIDER_SITE_OTHER): Payer: Medicaid Other | Admitting: *Deleted

## 2014-04-22 ENCOUNTER — Encounter: Payer: Self-pay | Admitting: Family Medicine

## 2014-04-22 VITALS — BP 153/79 | HR 87 | Temp 97.9°F

## 2014-04-22 DIAGNOSIS — I4891 Unspecified atrial fibrillation: Secondary | ICD-10-CM

## 2014-04-22 DIAGNOSIS — E1165 Type 2 diabetes mellitus with hyperglycemia: Secondary | ICD-10-CM

## 2014-04-22 DIAGNOSIS — I10 Essential (primary) hypertension: Secondary | ICD-10-CM

## 2014-04-22 DIAGNOSIS — Z23 Encounter for immunization: Secondary | ICD-10-CM

## 2014-04-22 DIAGNOSIS — E118 Type 2 diabetes mellitus with unspecified complications: Secondary | ICD-10-CM

## 2014-04-22 DIAGNOSIS — IMO0002 Reserved for concepts with insufficient information to code with codable children: Secondary | ICD-10-CM

## 2014-04-22 MED ORDER — PANTOPRAZOLE SODIUM 40 MG PO TBEC: 40.0000 mg | DELAYED_RELEASE_TABLET | Freq: Every day | ORAL | Status: DC

## 2014-04-22 MED ORDER — LISINOPRIL 20 MG PO TABS: 20.0000 mg | ORAL_TABLET | Freq: Every day | ORAL | Status: DC

## 2014-04-22 MED ORDER — INSULIN GLARGINE 100 UNIT/ML ~~LOC~~ SOLN: 40.0000 [IU] | Freq: Every day | SUBCUTANEOUS | Status: DC

## 2014-04-22 MED ORDER — RIVAROXABAN 20 MG PO TABS: 20.0000 mg | ORAL_TABLET | Freq: Every day | ORAL | Status: DC

## 2014-04-22 NOTE — Assessment & Plan Note (Signed)
BP elevated, even on recheck. Will double lisinopril to 20mg  daily. Return in 2 weeks for RN BP check and BMET.

## 2014-04-22 NOTE — Progress Notes (Signed)
Patient ID: Steven [name], male   DOB: 07-Apr-1961, 53 y.o.   MRN: [id number]  HPI:  DM - Currently doing Lantus 37 units daily. Forgot his CBG log today but states sugars running around 160. Highest 250. In the morning getting no lower than 130. Last eye exam was over 1 year ago. Needs referral for eye doctor.  Afib - performed medication reconciliation today and it appears pt has not been taking xarelto. He states his pharmacy has sent [place] refill requests but we have not filled it. I have not received refill requests for his xarelto.  Hands throbbing - complained of this at last visit. Got better then came back last night. Did exercises with hands which made it a little better. Has not tried tylenol. No swelling. Thinks palms are a little red.  Also needs protonix refilled, has not been taking this for the same reason. Would like to continue it.  ROS: See HPI.  [place]: HTN, HLD, hx bilat BKA, T2DM, afib  PHYSICAL EXAM: BP 153/79 mmHg  Pulse 87  Temp(Src) 97.9 F (36.6 C) (Oral)  Wt  Gen: NAD HEENT: NCAT Heart: RRR no murmurs Lungs: CTAB, NWOB Neuro: grossly nonfocal speech normal Ext: bilat hands normal in appearance (except chronic flexure deformity of PIP joint of R 5th finger). Full grip strength bilat. No swelling. Questionable possible mild erythema of palms, not really convincing. S/p bilat BKA, stumps wrapped.  ASSESSMENT/PLAN:  Hand pain - etiology unclear, seems to come and go. Again recommend tylenol, stretching/exercise. Return if not better.  HTN (hypertension) BP elevated, even on recheck. Will double lisinopril to 20mg  daily. Return in 2 weeks for RN BP check and BMET.  Diabetes mellitus type II, uncontrolled A1c much improved at last visit (to 9.1 from 14.6 when last checked). Suspect glycemic control improved after he had bilateral BKA's done, removing chronic source of infection. Still having some elevated CBG's with fasting sugars no lower than 130, so will  increase lantus to 40units daily. F/u in 3-4 weeks for further titration.  Cardiac: on asprin, statin Renal: on ACE, check renal fxn in 2 weeks since increasing dose today Eye: refer to ophtho for eye exam, hx of diabetic retinopathy and HTNsive retinopathy noted Foot: n/a - s/p bilat BKAs.   Atrial fibrillation with RVR Refilled xarelto today since it seems he has not been taking it due to not having refills. Remains rate controlled.   FOLLOW UP: F/u in 2 weeks for RN BP check & lab appt F/u with me in 3-4 weeks for further insulin titration.  [place]. [name], [place]

## 2014-04-22 NOTE — Assessment & Plan Note (Addendum)
Refilled xarelto today since it seems he has not been taking it due to not having refills. Remains rate controlled.

## 2014-04-22 NOTE — Patient Instructions (Addendum)
Refilled xarelto and protonix  Increase lantus to 40 units daily Chart your blood sugars We'll get you set up with an eye doctor  For blood pressure - increase lisinopril to 20mg  daily. I sent in a new prescription. Return in 2 weeks for a nurse BP check and labwork.  Use tylenol for your hands. Continue to do stretches. Return if not getting better.  Gave you flu shot today.  Follow up in 3-4 weeks for diabetes

## 2014-04-22 NOTE — Assessment & Plan Note (Signed)
A1c much improved at last visit (to 9.1 from 14.6 when last checked). Suspect glycemic control improved after he had bilateral BKA's done, removing chronic source of infection. Still having some elevated CBG's with fasting sugars no lower than 130, so will increase lantus to 40units daily. F/u in 3-4 weeks for further titration.  Cardiac: on asprin, statin Renal: on ACE, check renal fxn in 2 weeks since increasing dose today Eye: refer to ophtho for eye exam, hx of diabetic retinopathy and HTNsive retinopathy noted Foot: n/a - s/p bilat BKAs.

## 2014-05-19 ENCOUNTER — Other Ambulatory Visit: Payer: Self-pay | Admitting: *Deleted

## 2014-05-20 MED ORDER — GABAPENTIN 300 MG PO CAPS: ORAL_CAPSULE | ORAL | Status: DC

## 2014-05-22 ENCOUNTER — Ambulatory Visit (INDEPENDENT_AMBULATORY_CARE_PROVIDER_SITE_OTHER): Payer: Medicaid Other | Admitting: Family Medicine

## 2014-05-22 VITALS — BP 132/65 | HR 103 | Temp 98.4°F

## 2014-05-22 DIAGNOSIS — M79641 Pain in right hand: Secondary | ICD-10-CM

## 2014-05-22 DIAGNOSIS — I10 Essential (primary) hypertension: Secondary | ICD-10-CM

## 2014-05-22 DIAGNOSIS — E1165 Type 2 diabetes mellitus with hyperglycemia: Secondary | ICD-10-CM

## 2014-05-22 DIAGNOSIS — M79642 Pain in left hand: Secondary | ICD-10-CM

## 2014-05-22 DIAGNOSIS — IMO0002 Reserved for concepts with insufficient information to code with codable children: Secondary | ICD-10-CM

## 2014-05-22 LAB — BASIC METABOLIC PANEL
BUN: 8 mg/dL (ref 6–23)
CHLORIDE: 101 meq/L (ref 96–112)
CO2: 23 mEq/L (ref 19–32)
CREATININE: 0.85 mg/dL (ref 0.50–1.35)
Calcium: 9.2 mg/dL (ref 8.4–10.5)
Glucose, Bld: 269 mg/dL — ABNORMAL HIGH (ref 70–99)
Potassium: 3.8 mEq/L (ref 3.5–5.3)
Sodium: 134 mEq/L — ABNORMAL LOW (ref 135–145)

## 2014-05-22 MED ORDER — ACETAMINOPHEN 325 MG PO TABS: 650.0000 mg | ORAL_TABLET | Freq: Four times a day (QID) | ORAL | Status: DC | PRN

## 2014-05-22 NOTE — Patient Instructions (Signed)
Increase lantus to 43 units daily Keep checking sugars I am proud of you!!!  Checking kidney function today.  Follow up with me in 1 month for your diabetes I sent in a prescription for tylenol for your hands If this doesn't work then we can talk next time about other things to try.  Happy Holidays!  Dr. [name]

## 2014-05-22 NOTE — Progress Notes (Signed)
Patient ID: Steven [name], male   DOB: Sep 10, 1960, 53 y.o.   MRN: [id number]  HPI:  Hands throbbing - both hands have still been throbbing occasionally. Couldn't afford tylenol. Used aspirin without relief. Constant throbbing. Has done exercise ball without help. Could afford tylenol if I prescribe it because Medicaid will pay for it.  Diabetes - doing 40 units lantus daily. Has been checking sugars consistently and getting typically in the 120's-200's. Went to eye doctor four days ago. I haven't yet received those records.   HTN - no CP or SOB. At last visit we increased lisinopril to 20mg  daily, he's been taking this and is tolerating it well.  ROS: See HPI.  [place]: hx HLD, s/p bilat BKA, T2DM, HTN, diabetic retinopathy, afib  PHYSICAL EXAM: BP 132/65 mmHg  Pulse 103  Temp(Src) 98.4 F (36.9 C) (Oral)  Wt  Gen: NAD HEENT: NCAT Heart: RRR no murmurs Lungs: CTAB, NWOB Neuro: grossly nonfocal speech normal Ext: bilat BKA's wrapped. Hands without obvious abnormalities. Grip 5/5 bilat. Sensation intact to light touch. 2+ radial pulse bilat. No acute skin changes.  ASSESSMENT/PLAN:  HTN (hypertension) Well controlled. Continue current regimen. Check BMET today since we increased lisinopril at last visit.  Diabetes mellitus type II, uncontrolled Has been doing very well with checking sugars. I am proud of him for this. No sugars less than 120, so will go ahead and increase lantus to 43 units daily. F/u in several weeks. Gave new printed CBG chart.  Bilateral hand pain Continues to complain of this, but hasn't really had a true trial of OTC medication. Will rx tylenol for him to use. If this does not help, will consider additional workup or imaging as this issue has been ongoing.   FOLLOW UP: F/u in 1 month for diabetes.  [place]. [name], [place]

## 2014-05-29 DIAGNOSIS — M79641 Pain in right hand: Secondary | ICD-10-CM | POA: Insufficient documentation

## 2014-05-29 DIAGNOSIS — M79642 Pain in left hand: Secondary | ICD-10-CM

## 2014-05-29 NOTE — Assessment & Plan Note (Signed)
Continues to complain of this, but hasn't really had a true trial of OTC medication. Will rx tylenol for him to use. If this does not help, will consider additional workup or imaging as this issue has been ongoing.

## 2014-05-29 NOTE — Assessment & Plan Note (Signed)
Has been doing very well with checking sugars. I am proud of him for this. No sugars less than 120, so will go ahead and increase lantus to 43 units daily. F/u in several weeks. Gave new printed CBG chart.

## 2014-05-29 NOTE — Assessment & Plan Note (Signed)
Well controlled. Continue current regimen. Check BMET today since we increased lisinopril at last visit.

## 2014-06-01 ENCOUNTER — Other Ambulatory Visit: Payer: Self-pay | Admitting: *Deleted

## 2014-06-01 MED ORDER — LISINOPRIL 20 MG PO TABS: 20.0000 mg | ORAL_TABLET | Freq: Every day | ORAL | Status: DC

## 2014-06-10 ENCOUNTER — Encounter: Payer: Self-pay | Admitting: Internal Medicine

## 2014-06-22 ENCOUNTER — Ambulatory Visit (INDEPENDENT_AMBULATORY_CARE_PROVIDER_SITE_OTHER): Payer: Medicaid Other | Admitting: Family Medicine

## 2014-06-22 ENCOUNTER — Encounter: Payer: Self-pay | Admitting: Family Medicine

## 2014-06-22 VITALS — BP 142/78 | HR 89 | Temp 98.2°F | Ht 76.0 in

## 2014-06-22 DIAGNOSIS — M79642 Pain in left hand: Secondary | ICD-10-CM

## 2014-06-22 DIAGNOSIS — Z86010 Personal history of colonic polyps: Secondary | ICD-10-CM

## 2014-06-22 DIAGNOSIS — E1165 Type 2 diabetes mellitus with hyperglycemia: Secondary | ICD-10-CM

## 2014-06-22 DIAGNOSIS — IMO0002 Reserved for concepts with insufficient information to code with codable children: Secondary | ICD-10-CM

## 2014-06-22 DIAGNOSIS — I480 Paroxysmal atrial fibrillation: Secondary | ICD-10-CM

## 2014-06-22 DIAGNOSIS — M79641 Pain in right hand: Secondary | ICD-10-CM

## 2014-06-22 DIAGNOSIS — I4891 Unspecified atrial fibrillation: Secondary | ICD-10-CM

## 2014-06-22 LAB — POCT GLYCOSYLATED HEMOGLOBIN (HGB A1C): HEMOGLOBIN A1C: 12.5

## 2014-06-22 MED ORDER — ACETAMINOPHEN 325 MG PO TABS: 650.0000 mg | ORAL_TABLET | Freq: Four times a day (QID) | ORAL | Status: DC | PRN

## 2014-06-22 MED ORDER — INSULIN GLARGINE 100 UNIT/ML ~~LOC~~ SOLN: 46.0000 [IU] | Freq: Every day | SUBCUTANEOUS | Status: DC

## 2014-06-22 MED ORDER — RIVAROXABAN 20 MG PO TABS
20.0000 mg | ORAL_TABLET | Freq: Every day | ORAL | Status: DC
Start: 2014-06-22 — End: 2015-04-27

## 2014-06-22 NOTE — Patient Instructions (Signed)
It was great to see you again today!  Keep up the great work with checking your sugars Increase Lantus to 46 units daily We will get you set up with an appointment with Dr. Carlean Purl to discuss next colonoscopy Checking x-rays of hands I gave you a prescription for Tylenol I refilled your Xarelto  Follow-up with me in one month.  Be well, Dr. [name]

## 2014-06-22 NOTE — Progress Notes (Signed)
Patient ID: Steven [name], male   DOB: 22-May-1961, 54 y.o.   MRN: [id number]  HPI:  Diabetes: Currently taking Lantus 43 units daily. Denies chest pain or shortness of breath. Sugars have been ranging between 111 and 132 fasting. He typically has numbers in the 200s later in the day. States last eye exam was sometime last year.  Hand pain: Continues to have bilateral hand pain. This is especially noticeable if he tries to open a jar. He has not taken Tylenol as prescribed, because the pharmacy apparently did not fill it. It is a general aching feeling in his hands. No specific location that is worse than another.  A. fib: Remains on Xarelto, but has been out of it the last couple of days because he needed a refill. He has not had any bleeding.  Colon polyps: Due for repeat colonoscopy last month. Needs referral to GI to get this set up.  ROS: See HPI.  [place]: History of bilateral BKA, poorly controlled diabetes, hypertension, diabetic retinopathy, A. fib with RVR, tobacco and cocaine abuse  PHYSICAL EXAM: BP 142/78 mmHg  Pulse 89  Temp(Src) 98.2 F (36.8 C) (Oral)  Ht 6\' 4"  (1.93 m) Gen: No acute distress, pleasant, cooperative sitting in motorized wheelchair HEENT: NCAT Heart: RRR Lungs: CTAB NWOB Neuro: grossly nonfocal speech normal Ext: s/p bilat BKA, hands without acute traumatic issues. Grip 5/5 bilat. 2+ radial pulses bilat  ASSESSMENT/PLAN:  Health maintenance:  -refer to GI for f/u colonoscopy (due for repeat colonoscopy in Dec 2015)  Bilateral hand pain Did not get tylenol as pharmacy never filled it. Gave him printed paper rx today. Will also check xrays of bilat hands given the chronicity of these symptoms. Consider rheumatologic workup if tylenol not helping.   Diabetes mellitus type II, uncontrolled Increase lantus to 46 units daily. Has done well with checking sugars. Might benefit from mealtime coverage. A1c checked at end of pts appt and was quite high at over  12. I will have staff call him and recommend pharmacy clinic appt to discuss adding mealtime short acting insulin.  Cardiac: statin, clarify at next visit if also on aspirin (not on med list, possibly because he's on xarelto) Renal: on ACE Eye: at future visit will attempt to obtain records Foot: s/p bilat BKA    Atrial fibrillation Rate controlled. Refill xarelto today.    FOLLOW UP: F/u with pharmacy clinic for mealtime insulin initiation See me in 1 month for diabetes *note need to discuss aspirin, eye history at that visit  [place]. [name], [place]

## 2014-06-23 ENCOUNTER — Other Ambulatory Visit: Payer: Self-pay | Admitting: *Deleted

## 2014-06-23 MED ORDER — ALBUTEROL SULFATE HFA 108 (90 BASE) MCG/ACT IN AERS: 2.0000 | INHALATION_SPRAY | Freq: Four times a day (QID) | RESPIRATORY_TRACT | Status: DC | PRN

## 2014-06-28 NOTE — Assessment & Plan Note (Addendum)
Increase lantus to 46 units daily. Has done well with checking sugars. Might benefit from mealtime coverage. A1c checked at end of pts appt and was quite high at over 12. I will have staff call him and recommend pharmacy clinic appt to discuss adding mealtime short acting insulin.  Cardiac: statin, clarify at next visit if also on aspirin (not on med list, possibly because he's on xarelto) Renal: on ACE Eye: at future visit will attempt to obtain records Foot: s/p bilat BKA

## 2014-06-28 NOTE — Assessment & Plan Note (Signed)
Did not get tylenol as pharmacy never filled it. Gave him printed paper rx today. Will also check xrays of bilat hands given the chronicity of these symptoms. Consider rheumatologic workup if tylenol not helping.

## 2014-06-28 NOTE — Assessment & Plan Note (Signed)
Rate controlled. Refill xarelto today. 

## 2014-07-22 ENCOUNTER — Ambulatory Visit: Payer: Medicaid Other | Admitting: Physical Therapy

## 2014-07-23 ENCOUNTER — Ambulatory Visit: Payer: Medicaid Other | Attending: Orthopaedic Surgery | Admitting: Physical Therapy

## 2014-07-23 ENCOUNTER — Encounter: Payer: Self-pay | Admitting: Physical Therapy

## 2014-07-23 DIAGNOSIS — R2689 Other abnormalities of gait and mobility: Secondary | ICD-10-CM

## 2014-07-23 DIAGNOSIS — R29818 Other symptoms and signs involving the nervous system: Secondary | ICD-10-CM | POA: Diagnosis not present

## 2014-07-23 DIAGNOSIS — R269 Unspecified abnormalities of gait and mobility: Secondary | ICD-10-CM | POA: Insufficient documentation

## 2014-07-23 DIAGNOSIS — Z7409 Other reduced mobility: Secondary | ICD-10-CM | POA: Diagnosis not present

## 2014-07-23 DIAGNOSIS — Z89512 Acquired absence of left leg below knee: Secondary | ICD-10-CM | POA: Diagnosis not present

## 2014-07-23 DIAGNOSIS — Z89511 Acquired absence of right leg below knee: Secondary | ICD-10-CM | POA: Insufficient documentation

## 2014-07-23 NOTE — Therapy (Signed)
[facility] [address] [place], [place], [address] Phone: [phone]   Fax:  [phone]  Physical Therapy Evaluation  Patient Details  Name: Steven [name] MRN: [id number] Date of Birth: [birth year] Referring Provider:  Leeanne Rio, MD  Encounter Date: 07/23/2014      PT End of Session - 07/23/14 2110    Visit Number 1   Number of Visits 9   PT Start Time 1100   PT Stop Time 1150   PT Time Calculation (min) 50 min   Equipment Utilized During Treatment Gait belt   Activity Tolerance Patient tolerated treatment well   Behavior During Therapy [facility] for tasks assessed/performed      Past Medical History  Diagnosis Date  . Uncontrolled diabetes mellitus   . HTN (hypertension)   . History of DVT (deep vein thrombosis)   . Personal history of diabetic foot ulcer   . Peripheral vascular disease     a. Abnl ABIs 2014.  Marland Kitchen Peripheral neuropathy 11/19/2011  . Varicose vein     legs  . Osteomyelitis of foot, right, acute 08/30/2012  . Personal history of colonic adenomas 05/29/2013  . History of cocaine use   . PAF (paroxysmal atrial fibrillation)     a. Dx 11/2013 in setting of sepsis/foot infection.  . Tobacco abuse   . H/O ETOH abuse   . Dysrhythmia     afib last admission  . Seasonal allergies     Past Surgical History  Procedure Laterality Date  . Amputation  04/21/2011    Procedure: AMPUTATION RAY;  Surgeon: Newt Minion, MD;  Location: [place];  Service: Orthopedics;  Laterality: Right;  Rt foot 2nd ray ampt  . I&d extremity  05/03/2011    Procedure: IRRIGATION AND DEBRIDEMENT EXTREMITY;  Surgeon: Newt Minion, MD;  Location: [place];  Service: Orthopedics;  Laterality: Right;  Irrigation and Debridement Right Foot and Place Acell Xenograft  . Toe amputation  04/24/2012    great toe   right foot  . Amputation  04/24/2012    Procedure: AMPUTATION RAY;  Surgeon: Newt Minion, MD;  Location: [place];   Service: Orthopedics;  Laterality: Right;  Right foot 1st ray amputation  . Amputation Left 04/23/2013    Procedure: AMPUTATION RAY;  Surgeon: Newt Minion, MD;  Location: [place];  Service: Orthopedics;  Laterality: Left;  Left Foot 5th Ray Amputation  . Colonoscopy    . Amputation Left 11/21/2013    Procedure: AMPUTATION RAY;  Surgeon: Newt Minion, MD;  Location: [place];  Service: Orthopedics;  Laterality: Left;  Left Foot 1st & 2nd Ray Amputation  . Amputation Right 12/04/2013    Procedure: AMPUTATION BELOW KNEE;  Surgeon: Marianna Payment, MD;  Location: [place];  Service: Orthopedics;  Laterality: Right;  . Amputation Left 12/29/2013    Procedure: LEFT AMPUTATION BELOW KNEE;  Surgeon: Marianna Payment, MD;  Location: [place];  Service: Orthopedics;  Laterality: Left;    There were no vitals taken for this visit.  Visit Diagnosis:  Abnormality of gait - Plan: PT plan of care cert/re-cert  Balance problems - Plan: PT plan of care cert/re-cert  Impaired functional mobility and activity tolerance - Plan: PT plan of care cert/re-cert  Status post bilateral below knee amputation - Plan: PT plan of care cert/re-cert      Subjective Assessment - 07/23/14 1110    Symptoms This 54yo male underwent a right Transtibial Amputation  on 12/04/13 and a left Transtibial Amputation on 12/29/13. He recieved his first set of TTA prostheses on 07/21/14. He is dependent in use & care. He presents for physical therapy evaluation & prosthetic trainimg.   Patient Stated Goals Use prostheses to walk in community to store, park,    Currently in Pain? No/denies          [facility] PT Assessment - 07/23/14 1100    Assessment   Medical Diagnosis Bil. Transtibial Amputations   Onset Date 07/21/14   Precautions   Precautions Fall   Restrictions   Weight Bearing Restrictions No   Balance Screen   Has the patient fallen in the past 6 months No   Has the patient had a decrease in activity level because of a fear of  falling?  Yes   Is the patient reluctant to leave their home because of a fear of falling?  No  uses power wc   [place] Private residence   Living Arrangements Alone   Type of [place] to enter   Entrance Stairs-Number of Steps 2  another walkway has 6 steps with 2 rails wide   Entrance Stairs-Rails Left   Home Layout One level  1st floor,   Home Equipment Walker - 2 wheels;Cane - single point;Crutches;Bedside commode;Tub bench;Grab bars - tub/shower;Wheelchair - power  he got power for 2 years   Prior Function   Level of Independence Independent with basic ADLs;Independent with homemaking with ambulation;Independent with gait;Independent with transfers   Cognition   Overall Cognitive Status Within Functional Limits for tasks assessed   Observation/Other Assessments   Focus on Therapeutic Outcomes (FOTO)  40  Functional Status   Fear Avoidance Belief Questionnaire (FABQ)  19 (5)   Posture/Postural Control   Posture/Postural Control Postural limitations   Postural Limitations Rounded Shoulders;Forward head;Flexed trunk   AROM   Overall AROM  Within functional limits for tasks performed  UEs & LEs WFL   Strength   Overall Strength Within functional limits for tasks performed  UEs & LEs tested grossly 5/5 in sitting   Transfers   Transfers Sit to Stand;Stand to Sit   Sit to Stand 4: Min assist;With upper extremity assist;With armrests;From chair/3-in-1  to rolling walker for stabalization   Stand to Sit 4: Min guard;With upper extremity assist;With armrests;To chair/3-in-1  from rolling walker for stability   Ambulation/Gait   Ambulation/Gait Yes   Ambulation/Gait Assistance 4: Min assist   Ambulation Distance (Feet) 50 Feet   Assistive device Rolling walker;Prostheses   Gait Pattern Step-through pattern;Decreased step length - right;Decreased step length - left;Decreased stride length;Decreased hip/knee flexion -  right;Decreased hip/knee flexion - left;Right hip hike;Left hip hike;Right flexed knee in stance;Left flexed knee in stance;Trunk flexed;Wide base of support   Ambulation Surface Level;Indoor   Gait velocity 0.61 ft/sec   Static Standing Balance   Static Standing - Balance Support No upper extremity supported  rolling walker close for safety   Static Standing - Level of Assistance 4: Min assist;5: Stand by assistance  maintains upright for 15sec with minA to SBA   Static Standing - Comment/# of Minutes maintains upright for 15sec with minA to SBA   Dynamic Standing Balance   Dynamic Standing - Balance Support Bilateral upper extremity supported;Left upper extremity supported  Rolling walker support w/ BUE for head turns & reach RUE   Dynamic Standing - Level of Assistance 4: Min assist   Dynamic Standing -  Balance Activities Head nods;Head turns;Reaching for objects  Reaches 5" with dominant right UE         Prosthetics Assessment - 07/23/14 0001    Prosthetic Care Dependent with Skin check;Residual limb care;Prosthetic cleaning;Ply sock cleaning;Correct ply sock adjustment;Proper wear schedule/adjustment;Proper weight-bearing schedule/adjustment  proper donning technique   Prosthetic Care Comments  PT recommended 2hrs on/ 2hrs off rotating awake hrs for 1 week if no skin issues or pain increase to 3hrs on / 1hr off rotation for 1 week   Donning prosthesis  Supervision  Maximal cues for proper technique   Doffing prosthesis  Supervision   Current prosthetic wear tolerance (days/week)  2 of 2 days since delivery   Current prosthetic wear tolerance (#hours/day)  reports all awake hours which is too rapid placing him at risk of skin issues &/or pain   Current prosthetic weight-bearing tolerance (hours/day)  3 minutes with UE support on rolling walker with no pain / discomfort   Edema non-pitting   Residual limb condition  left limb has 19mm spot that maybe underlying suture working out;  right limb has 23mm X 29mm superficial opening in medial incision   K code/activity level with prosthetic use  2                         PT Education - 07/23/14 1100    Education provided Yes   Education Details proper donning, adjusting ply socks, proper residual limb care, cleaning prosthesis & socks, wear (see prosthetic assessment),    Person(s) Educated Patient   Methods Explanation;Demonstration   Comprehension Verbalized understanding;Returned demonstration;Verbal cues required;Tactile cues required;Need further instruction          PT Short Term Goals - 07/23/14 1100    PT SHORT TERM GOAL #1   Title Patient verbalizes proper prosthetic care except cues on adjusting ply socks. (Target Date: 4th visit after Eval)   Baseline Patient is dependent in all aspects of prosthetic care.   Status New   PT SHORT TERM GOAL #2   Title Patient tolerates wear >75% of awake hours without changes in skin integrity or pain / discomfort. (Target Date: 4th visit after Eval)   Baseline Patient's prostheses were delivered 2 days ago and he has progressed wear too rapidly placing him at risk of skin issues &/or pain.   Status New   PT SHORT TERM GOAL #3   Title Sit to/from stand wc to rolling walker and reaches 10" with walker support modified independent. (Target Date: 4th visit after Eval)   Baseline Sit to/from stand wc to rolling walker with minimal assist and reaches 5" with walker support with minimal assist.    Status New   PT SHORT TERM GOAL #4   Title Patient ambulates 125' with rolling walker & prostheses with supervision. (Target Date: 4th visit after Eval)   Baseline Patient ambulates 43' with rolling walker & prostheses with minimal assist.    Status New   PT SHORT TERM GOAL #5   Title Patient negotiates stairs with 2 rails & prostheses with minimal assist. (Target Date: 4th visit after Eval)   Baseline Patient is unable to negotiate barriers with prostheses yet.    Status New           PT Long Term Goals - 07/23/14 1100    PT LONG TERM GOAL #1   Title Patient is independent in prosthetic care. (Target Date: 8th visit after Eval)   Baseline Patient is  dependent in all aspects of prosthetic care.   Time 3   Period Months   Status New   PT LONG TERM GOAL #2   Title Patient tolerates wear of prostheses >90% of awake hours without change in skin integrity or pain / discomfort. (Target Date: 8th visit after Eval)   Baseline Patient recieved prostheses 2 days ago & is progressing wear too rapidly placing him at risk of skin integrity issues &/or pain / discomfort.   Time 3   Period Months   Status New   PT LONG TERM GOAL #3   Title Patient is independent with standing balance with occasional touch to stabalize managing clothes (pants for toileting & jackets) and reaching 10", in upper /lower cabinetry and to floor modified independent. (Target Date: 8th visit after Eval)   Baseline Patient requires minimal assist for sit to/from stand and reaching 5" with rolling walker & prostheses.   Time 3   Status New   PT LONG TERM GOAL #4   Title Patient ambulates 250' with LRAD & prostheses modified independent. (Target Date: 8th visit after Eval)   Baseline Patient ambulates 85' with rolling walker & prostheses with minimal assist.   Time 3   Period Months   Status New   PT LONG TERM GOAL #5   Title Patient negotiates ramp, curb & stairs with LRAD & prostheses modified independent. (Target Date: 8th visit after Eval)   Baseline Patient is unknowledgeable / dependent & unable to ambulate /negotiate barriers with prostheses.   Time 3   Period Months   Status New               Plan - 07/23/14 1100    Clinical Impression Statement This 54yo male underwent a right Transtibial Amputation on 12/04/13 and a left Transtibial Amputation on 12/29/13. He recieved his first set of prostheses on 07/21/14 and is dependent in use & care. He requires skilled care /  instruction for prosthesis wear & use. He has worn prostheses "all day" for 2 days since delivery which places him at risk for skin issues &/or pain. He requires minA for sit to/from stand and standing balance with rolling walker support. He ambulates only 50" with rolling walker & prostheses with minimal assist. He is unknowledgeable in negotiating barriers with prostheses.   Pt will benefit from skilled therapeutic intervention in order to improve on the following deficits Abnormal gait;Decreased activity tolerance;Decreased balance;Decreased endurance;Decreased knowledge of use of DME;Decreased mobility;Other (comment)  prosthetic dependency   Rehab Potential Good   PT Frequency 1x / week   PT Duration 8 weeks  over 3 month period   PT Treatment/Interventions ADLs/Self Care Home Management;DME Instruction;Gait training;Stair training;Functional mobility training;Therapeutic activities;Therapeutic exercise;Balance training;Neuromuscular re-education;Patient/family education;Other (comment)  prosthetic training   PT Next Visit Plan instruct in prosthetic care, gait with rolling walker   PT Home Exercise Plan HEP for mid-line at sink   Consulted and Agree with Plan of Care Patient         Problem List Patient Active Problem List   Diagnosis Date Noted  . Bilateral hand pain 05/29/2014  . Acne 03/30/2014  . S/P bilateral BKA (below knee amputation) 01/22/2014  . Cocaine abuse 12/05/2013  . Tobacco abuse 12/04/2013  . Peripheral vascular disease   . Atrial fibrillation 12/03/2013  . 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 Personal history of colonic adenomas 05/29/2013  . Diabetic retinopathy 12/25/2012  . HLD (hyperlipidemia) 12/08/2012  . Peripheral neuropathy 11/19/2011  . ETOH abuse  03/29/2011  . HTN (hypertension) 12/22/2010  . Diabetes mellitus type II, uncontrolled 07/18/2006  . VENOUS INSUFFICIENCY 07/18/2006    Carlynn Purl, DPT Physical Therapist Specializing in  Prosthetics 07/23/2014, 9:41 PM  [facility] [address] [place], [place], [address] Phone: [phone]   Fax:  [phone]

## 2014-07-27 ENCOUNTER — Ambulatory Visit: Payer: Medicaid Other | Admitting: Family Medicine

## 2014-08-10 ENCOUNTER — Ambulatory Visit: Payer: Medicaid Other | Admitting: Physical Therapy

## 2014-08-12 ENCOUNTER — Encounter: Payer: Self-pay | Admitting: Family Medicine

## 2014-08-12 ENCOUNTER — Ambulatory Visit (INDEPENDENT_AMBULATORY_CARE_PROVIDER_SITE_OTHER): Payer: Medicaid Other | Admitting: Family Medicine

## 2014-08-12 VITALS — BP 124/68 | HR 101 | Temp 98.0°F | Ht 76.0 in

## 2014-08-12 DIAGNOSIS — IMO0002 Reserved for concepts with insufficient information to code with codable children: Secondary | ICD-10-CM

## 2014-08-12 DIAGNOSIS — M79642 Pain in left hand: Secondary | ICD-10-CM

## 2014-08-12 DIAGNOSIS — R05 Cough: Secondary | ICD-10-CM | POA: Insufficient documentation

## 2014-08-12 DIAGNOSIS — R059 Cough, unspecified: Secondary | ICD-10-CM | POA: Insufficient documentation

## 2014-08-12 DIAGNOSIS — M79641 Pain in right hand: Secondary | ICD-10-CM

## 2014-08-12 DIAGNOSIS — E1165 Type 2 diabetes mellitus with hyperglycemia: Secondary | ICD-10-CM

## 2014-08-12 MED ORDER — CETIRIZINE HCL 10 MG PO TABS: 10.0000 mg | ORAL_TABLET | Freq: Every day | ORAL | Status: DC

## 2014-08-12 MED ORDER — INSULIN GLARGINE 100 UNIT/ML ~~LOC~~ SOLN: 49.0000 [IU] | Freq: Every day | SUBCUTANEOUS | Status: DC

## 2014-08-12 NOTE — Patient Instructions (Addendum)
Increase your insulin to 49 units daily. Follow-up with me in 3-4 weeks to adjust your insulin more. Take Zyrtec daily, lets see if this helps with your nighttime cough. We can readdress when you come back in 3-4 weeks.

## 2014-08-12 NOTE — Assessment & Plan Note (Signed)
Remains uncontrolled. Increase Lantus to 49 units daily. We may end up having to add mealtime insulin. Printed and gave patient new CBG chart. He will follow with me in 3-4 weeks.

## 2014-08-12 NOTE — Progress Notes (Signed)
Patient ID: Steven [name], male   DOB: 1961/06/04, 54 y.o.   MRN: [id number]  HPI:  Follow up diabetes: Currently using Lantus 46 units daily. States sugars range from 150-170 5 in the morning. Later in the day gets in the 170s. Of note he did not bring his CBG chart with him, so this is when he is recalled from memory. He denies chest pain or shortness of breath. No problems with low blood sugars.  Nighttime cough: For about a month has had a nighttime cough. He is not producing sputum. No fever. He is using Flonase but not Zyrtec. He does think he has some nasal congestion.  Right stump drainage: Has history of bilateral BKA. Has noted some sanguinous thick drainage from his right medial stump. No pain, no fever. He has an appointment tomorrow with his orthopedic doctor.  Bilateral hand pain: States Tylenol does help this, no longer a problem. Never got x-rays.  ROS: See HPI.  [place]: Hyperlipidemia, bilateral BKA, hypertension, diabetes  PHYSICAL EXAM: BP 124/68 mmHg  Pulse 101  Temp(Src) 98 F (36.7 C) (Oral)  Ht 6\' 4"  (1.93 m)  Wt  Gen: No acute distress, pleasant, cooperative, sitting in a motorized wheelchair HEENT: Normocephalic, atraumatic, nares patent, oropharynx clear and moist without exudate Heart: Regular rate and rhythm Lungs: Clear to auscultation bilaterally, normal respiratory effort Neuro: Speech normal, grossly nonfocal Ext: Status post bilateral BKA. Right stump with slight crusting at medial aspect of prior surgical incision. No active drainage, no erythema, no warmth.  ASSESSMENT/PLAN:  Diabetes mellitus type II, uncontrolled Remains uncontrolled. Increase Lantus to 49 units daily. We may end up having to add mealtime insulin. Printed and gave patient new CBG chart. He will follow with me in 3-4 weeks.   Bilateral hand pain Resolved, no further workup at this time.   Cough Differential includes allergic rhinitis, acid reflux, ACE inhibitor cough, or  infectious etiology. He is adequately treated for acid reflux with Protonix daily. I will start him on Zyrtec 10 mg daily as he does note some nasal congestion. I doubt any infectious etiology as he has no fever. Follow-up in 3-4 weeks to evaluate for improvement.   Stump drainage: Does not appear infected. Patient has follow-up appointment scheduled tomorrow with his orthopedic surgeon. Instructed him to await this appointment for further recommendations.  FOLLOW UP: F/u in 3-4 weeks for insulin titration and nighttime cough.  [place]. [name], [place]

## 2014-08-12 NOTE — Assessment & Plan Note (Signed)
Differential includes allergic rhinitis, acid reflux, ACE inhibitor cough, or infectious etiology. He is adequately treated for acid reflux with Protonix daily. I will start him on Zyrtec 10 mg daily as he does note some nasal congestion. I doubt any infectious etiology as he has no fever. Follow-up in 3-4 weeks to evaluate for improvement.

## 2014-08-12 NOTE — Assessment & Plan Note (Signed)
Resolved, no further workup at this time.

## 2014-08-14 ENCOUNTER — Ambulatory Visit: Payer: Medicaid Other | Attending: Orthopaedic Surgery | Admitting: Physical Therapy

## 2014-08-14 DIAGNOSIS — R29818 Other symptoms and signs involving the nervous system: Secondary | ICD-10-CM | POA: Insufficient documentation

## 2014-08-14 DIAGNOSIS — Z89511 Acquired absence of right leg below knee: Secondary | ICD-10-CM | POA: Insufficient documentation

## 2014-08-14 DIAGNOSIS — R269 Unspecified abnormalities of gait and mobility: Secondary | ICD-10-CM | POA: Insufficient documentation

## 2014-08-14 DIAGNOSIS — Z89512 Acquired absence of left leg below knee: Secondary | ICD-10-CM | POA: Insufficient documentation

## 2014-08-14 DIAGNOSIS — Z7409 Other reduced mobility: Secondary | ICD-10-CM | POA: Insufficient documentation

## 2014-08-17 ENCOUNTER — Ambulatory Visit: Payer: Medicaid Other | Admitting: Physical Therapy

## 2014-08-17 ENCOUNTER — Telehealth: Payer: Self-pay | Admitting: Physical Therapy

## 2014-08-17 NOTE — Telephone Encounter (Signed)
No show PT appt.

## 2014-08-24 ENCOUNTER — Encounter: Payer: Self-pay | Admitting: Physical Therapy

## 2014-08-24 ENCOUNTER — Ambulatory Visit: Payer: Medicaid Other | Admitting: Physical Therapy

## 2014-08-24 DIAGNOSIS — R269 Unspecified abnormalities of gait and mobility: Secondary | ICD-10-CM

## 2014-08-24 DIAGNOSIS — Z7409 Other reduced mobility: Secondary | ICD-10-CM | POA: Diagnosis not present

## 2014-08-24 DIAGNOSIS — Z89512 Acquired absence of left leg below knee: Secondary | ICD-10-CM | POA: Diagnosis not present

## 2014-08-24 DIAGNOSIS — R29818 Other symptoms and signs involving the nervous system: Secondary | ICD-10-CM | POA: Diagnosis not present

## 2014-08-24 DIAGNOSIS — Z89511 Acquired absence of right leg below knee: Secondary | ICD-10-CM

## 2014-08-24 NOTE — Therapy (Signed)
[facility] [address] [place], [place], [address] Phone: [phone]   Fax:  [phone]  Physical Therapy Treatment  Patient Details  Name: Steven [name] MRN: [id number] Date of Birth: February 26, 1961 Referring Provider:  Leeanne Rio, MD  Encounter Date: 08/24/2014      PT End of Session - 08/24/14 1015    Visit Number 2   Number of Visits 9   Authorization Type Medicaid    Authorization Time Period 08/06/14 - 11/25/14   Authorization - Visit Number 1   Authorization - Number of Visits 8   PT Start Time 1015   PT Stop Time 1100   PT Time Calculation (min) 45 min   Equipment Utilized During Treatment Gait belt   Activity Tolerance Patient tolerated treatment well   Behavior During Therapy [facility] for tasks assessed/performed      Past Medical History  Diagnosis Date  . Uncontrolled diabetes mellitus   . HTN (hypertension)   . History of DVT (deep vein thrombosis)   . Personal history of diabetic foot ulcer   . Peripheral vascular disease     a. Abnl ABIs 2014.  Marland Kitchen Peripheral neuropathy 11/19/2011  . Varicose vein     legs  . Osteomyelitis of foot, right, acute 08/30/2012  . Personal history of colonic adenomas 05/29/2013  . History of cocaine use   . PAF (paroxysmal atrial fibrillation)     a. Dx 11/2013 in setting of sepsis/foot infection.  . Tobacco abuse   . H/O ETOH abuse   . Dysrhythmia     afib last admission  . Seasonal allergies     Past Surgical History  Procedure Laterality Date  . Amputation  04/21/2011    Procedure: AMPUTATION RAY;  Surgeon: Newt Minion, MD;  Location: [place];  Service: Orthopedics;  Laterality: Right;  Rt foot 2nd ray ampt  . I&d extremity  05/03/2011    Procedure: IRRIGATION AND DEBRIDEMENT EXTREMITY;  Surgeon: Newt Minion, MD;  Location: [place];  Service: Orthopedics;  Laterality: Right;  Irrigation and Debridement Right Foot and Place Acell Xenograft  . Toe  amputation  04/24/2012    great toe   right foot  . Amputation  04/24/2012    Procedure: AMPUTATION RAY;  Surgeon: Newt Minion, MD;  Location: [place];  Service: Orthopedics;  Laterality: Right;  Right foot 1st ray amputation  . Amputation Left 04/23/2013    Procedure: AMPUTATION RAY;  Surgeon: Newt Minion, MD;  Location: [place];  Service: Orthopedics;  Laterality: Left;  Left Foot 5th Ray Amputation  . Colonoscopy    . Amputation Left 11/21/2013    Procedure: AMPUTATION RAY;  Surgeon: Newt Minion, MD;  Location: [place];  Service: Orthopedics;  Laterality: Left;  Left Foot 1st & 2nd Ray Amputation  . Amputation Right 12/04/2013    Procedure: AMPUTATION BELOW KNEE;  Surgeon: Marianna Payment, MD;  Location: [place];  Service: Orthopedics;  Laterality: Right;  . Amputation Left 12/29/2013    Procedure: LEFT AMPUTATION BELOW KNEE;  Surgeon: Marianna Payment, MD;  Location: [place];  Service: Orthopedics;  Laterality: Left;    There were no vitals filed for this visit.  Visit Diagnosis:  Abnormality of gait  Impaired functional mobility and activity tolerance  Status post bilateral below knee amputation      Subjective Assessment - 08/24/14 1024    Symptoms Wearing prostheses ~3 days per week over last month since  evaluation for ~ 2hours at time.   Currently in Pain? No/denies                       [facility] Adult PT Treatment/Exercise - 08/24/14 1030    Transfers   Transfers Sit to Stand;Stand to Sit   Sit to Stand 5: Supervision;With upper extremity assist;With armrests;From chair/3-in-1  to walker for stabilization   Sit to Stand Details (indicate cue type and reason) cues on technique   Stand to Sit 5: Supervision;With upper extremity assist;With armrests;To chair/3-in-1  from walker for stabilization   Stand to Sit Details cues for safety   Ambulation/Gait   Ambulation/Gait Yes   Ambulation/Gait Assistance 4: Min assist;5: Supervision   Ambulation Distance (Feet)  60 Feet  2 reps   Assistive device Rolling walker;Prostheses   Gait Pattern Step-through pattern;Decreased step length - right;Decreased step length - left;Decreased stride length;Decreased hip/knee flexion - right;Decreased hip/knee flexion - left;Right hip hike;Left hip hike;Right flexed knee in stance;Left flexed knee in stance;Trunk flexed;Wide base of support   Ambulation Surface Level;Indoor   Stairs Yes   Stairs Assistance 4: Min assist   Stair Management Technique Two rails;Step to pattern;Forwards  prostheses, RLE led 2 reps, LLE led 2 reps   Number of Stairs 4   Ramp 3: Mod assist  RW & prostheses   Ramp Details (indicate cue type and reason) verbal cues on technique   Curb 3: Mod assist  RW & prostheses   Curb Details (indicate cue type and reason) demo & verbal cues on technique   Prosthetics   Prosthetic Care Comments  PT recommended 2hrs on/ 2hrs off rotating awake hrs for 1 week if no skin issues or pain increase to 3hrs on / 1hr off rotation for 1 week   Current prosthetic wear tolerance (days/week)  PT recommended daily wear   Current prosthetic wear tolerance (#hours/day)  PT recommended 2 hours on, 2 hours off rotating to tolerance   Current prosthetic weight-bearing tolerance (hours/day)  5 minutes with UE support   Edema non-pitting   Residual limb condition  right limb has 2 spots 86mm with no drainage, left limb has no openings.    Education Provided Skin check;Proper wear schedule/adjustment;Prosthetic cleaning;Correct ply sock adjustment  donning   Person(s) Educated Patient   Education Method Explanation;Demonstration   Education Method Verbalized understanding;Needs further instruction   Donning Prosthesis Supervision   Doffing Prosthesis Modified independent (device/increased time)                PT Education - 08/24/14 1015    Education provided Yes   Education Details See prosthetic care,   Person(s) Educated Patient   Methods Explanation    Comprehension Verbalized understanding;Need further instruction          PT Short Term Goals - 07/23/14 1100    PT SHORT TERM GOAL #1   Title Patient verbalizes proper prosthetic care except cues on adjusting ply socks. (Target Date: 4th visit after Eval)   Baseline Patient is dependent in all aspects of prosthetic care.   Status New   PT SHORT TERM GOAL #2   Title Patient tolerates wear >75% of awake hours without changes in skin integrity or pain / discomfort. (Target Date: 4th visit after Eval)   Baseline Patient's prostheses were delivered 2 days ago and he has progressed wear too rapidly placing him at risk of skin issues &/or pain.   Status New   PT SHORT TERM  GOAL #3   Title Sit to/from stand wc to rolling walker and reaches 10" with walker support modified independent. (Target Date: 4th visit after Eval)   Baseline Sit to/from stand wc to rolling walker with minimal assist and reaches 5" with walker support with minimal assist.    Status New   PT SHORT TERM GOAL #4   Title Patient ambulates 125' with rolling walker & prostheses with supervision. (Target Date: 4th visit after Eval)   Baseline Patient ambulates 17' with rolling walker & prostheses with minimal assist.    Status New   PT SHORT TERM GOAL #5   Title Patient negotiates stairs with 2 rails & prostheses with minimal assist. (Target Date: 4th visit after Eval)   Baseline Patient is unable to negotiate barriers with prostheses yet.   Status New           PT Long Term Goals - 07/23/14 1100    PT LONG TERM GOAL #1   Title Patient is independent in prosthetic care. (Target Date: 8th visit after Eval)   Baseline Patient is dependent in all aspects of prosthetic care.   Time 3   Period Months   Status New   PT LONG TERM GOAL #2   Title Patient tolerates wear of prostheses >90% of awake hours without change in skin integrity or pain / discomfort. (Target Date: 8th visit after Eval)   Baseline Patient recieved  prostheses 2 days ago & is progressing wear too rapidly placing him at risk of skin integrity issues &/or pain / discomfort.   Time 3   Period Months   Status New   PT LONG TERM GOAL #3   Title Patient is independent with standing balance with occasional touch to stabalize managing clothes (pants for toileting & jackets) and reaching 10", in upper /lower cabinetry and to floor modified independent. (Target Date: 8th visit after Eval)   Baseline Patient requires minimal assist for sit to/from stand and reaching 5" with rolling walker & prostheses.   Time 3   Status New   PT LONG TERM GOAL #4   Title Patient ambulates 250' with LRAD & prostheses modified independent. (Target Date: 8th visit after Eval)   Baseline Patient ambulates 2' with rolling walker & prostheses with minimal assist.   Time 3   Period Months   Status New   PT LONG TERM GOAL #5   Title Patient negotiates ramp, curb & stairs with LRAD & prostheses modified independent. (Target Date: 8th visit after Eval)   Baseline Patient is unknowledgeable / dependent & unable to ambulate /negotiate barriers with prostheses.   Time 3   Period Months   Status New               Plan - 08/24/14 1015    Clinical Impression Statement Patient has better understanding of proper donning. He seems to understand need to progress wear to progress function. He reports tired after PT session especially arms.   Pt will benefit from skilled therapeutic intervention in order to improve on the following deficits Abnormal gait;Decreased activity tolerance;Decreased balance;Decreased endurance;Decreased knowledge of use of DME;Decreased mobility;Other (comment)  prosthetic dependency   Rehab Potential Good   PT Frequency 1x / week   PT Duration 8 weeks  over 3 month period   PT Treatment/Interventions ADLs/Self Care Home Management;DME Instruction;Gait training;Stair training;Functional mobility training;Therapeutic activities;Therapeutic  exercise;Balance training;Neuromuscular re-education;Patient/family education;Other (comment)  prosthetic training   PT Next Visit Plan review prosthetic care, gait with rolling  walker   PT Home Exercise Plan HEP for mid-line at sink   Consulted and Agree with Plan of Care Patient        Problem List Patient Active Problem List   Diagnosis Date Noted  . Cough 08/12/2014  . Bilateral hand pain 05/29/2014  . Acne 03/30/2014  . S/P bilateral BKA (below knee amputation) 01/22/2014  . Cocaine abuse 12/05/2013  . Tobacco abuse 12/04/2013  . Peripheral vascular disease   . Atrial fibrillation 12/03/2013  . 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 Personal history of colonic adenomas 05/29/2013  . Diabetic retinopathy 12/25/2012  . HLD (hyperlipidemia) 12/08/2012  . Peripheral neuropathy 11/19/2011  . ETOH abuse 03/29/2011  . HTN (hypertension) 12/22/2010  . Diabetes mellitus type II, uncontrolled 07/18/2006  . VENOUS INSUFFICIENCY 07/18/2006    Jamey Reas PT, DPT 08/24/2014, 3:30 PM  [facility] [address] [place], [place], [address] Phone: [phone]   Fax:  [phone]

## 2014-08-31 ENCOUNTER — Ambulatory Visit: Payer: Medicaid Other | Admitting: Physical Therapy

## 2014-08-31 ENCOUNTER — Encounter: Payer: Self-pay | Admitting: Physical Therapy

## 2014-08-31 DIAGNOSIS — R269 Unspecified abnormalities of gait and mobility: Secondary | ICD-10-CM

## 2014-08-31 DIAGNOSIS — R2689 Other abnormalities of gait and mobility: Secondary | ICD-10-CM

## 2014-08-31 DIAGNOSIS — Z7409 Other reduced mobility: Secondary | ICD-10-CM

## 2014-08-31 DIAGNOSIS — Z89512 Acquired absence of left leg below knee: Secondary | ICD-10-CM

## 2014-08-31 DIAGNOSIS — Z89511 Acquired absence of right leg below knee: Secondary | ICD-10-CM

## 2014-08-31 NOTE — Therapy (Signed)
[facility] [address] [place], [place], [address] Phone: [phone]   Fax:  [phone]  Physical Therapy Treatment  Patient Details  Name: Steven [name] MRN: [id number] Date of Birth: [birth year] Referring Provider:  Leeanne Rio, MD  Encounter Date: 08/31/2014      PT End of Session - 08/31/14 1015    Visit Number 3   Number of Visits 9   Authorization Type Medicaid    Authorization Time Period 08/06/14 - 11/25/14   Authorization - Visit Number 2   Authorization - Number of Visits 8   PT Start Time 1015   PT Stop Time 1100   PT Time Calculation (min) 45 min   Equipment Utilized During Treatment Gait belt   Activity Tolerance Patient tolerated treatment well   Behavior During Therapy [facility] for tasks assessed/performed      Past Medical History  Diagnosis Date  . Uncontrolled diabetes mellitus   . HTN (hypertension)   . History of DVT (deep vein thrombosis)   . Personal history of diabetic foot ulcer   . Peripheral vascular disease     a. Abnl ABIs 2014.  Marland Kitchen Peripheral neuropathy 11/19/2011  . Varicose vein     legs  . Osteomyelitis of foot, right, acute 08/30/2012  . Personal history of colonic adenomas 05/29/2013  . History of cocaine use   . PAF (paroxysmal atrial fibrillation)     a. Dx 11/2013 in setting of sepsis/foot infection.  . Tobacco abuse   . H/O ETOH abuse   . Dysrhythmia     afib last admission  . Seasonal allergies     Past Surgical History  Procedure Laterality Date  . Amputation  04/21/2011    Procedure: AMPUTATION RAY;  Surgeon: Newt Minion, MD;  Location: [place];  Service: Orthopedics;  Laterality: Right;  Rt foot 2nd ray ampt  . I&d extremity  05/03/2011    Procedure: IRRIGATION AND DEBRIDEMENT EXTREMITY;  Surgeon: Newt Minion, MD;  Location: [place];  Service: Orthopedics;  Laterality: Right;  Irrigation and Debridement Right Foot and Place Acell Xenograft  . Toe  amputation  04/24/2012    great toe   right foot  . Amputation  04/24/2012    Procedure: AMPUTATION RAY;  Surgeon: Newt Minion, MD;  Location: [place];  Service: Orthopedics;  Laterality: Right;  Right foot 1st ray amputation  . Amputation Left 04/23/2013    Procedure: AMPUTATION RAY;  Surgeon: Newt Minion, MD;  Location: [place];  Service: Orthopedics;  Laterality: Left;  Left Foot 5th Ray Amputation  . Colonoscopy    . Amputation Left 11/21/2013    Procedure: AMPUTATION RAY;  Surgeon: Newt Minion, MD;  Location: [place];  Service: Orthopedics;  Laterality: Left;  Left Foot 1st & 2nd Ray Amputation  . Amputation Right 12/04/2013    Procedure: AMPUTATION BELOW KNEE;  Surgeon: Marianna Payment, MD;  Location: [place];  Service: Orthopedics;  Laterality: Right;  . Amputation Left 12/29/2013    Procedure: LEFT AMPUTATION BELOW KNEE;  Surgeon: Marianna Payment, MD;  Location: [place];  Service: Orthopedics;  Laterality: Left;    There were no vitals filed for this visit.  Visit Diagnosis:  Abnormality of gait  Impaired functional mobility and activity tolerance  Status post bilateral below knee amputation  Balance problems      Subjective Assessment - 08/31/14 1023    Symptoms wearing prostheses all awake hours daily since  last appt.    Currently in Pain? No/denies                       [facility] Adult PT Treatment/Exercise - 08/31/14 1024    Transfers   Transfers Sit to Stand;Stand to Sit   Sit to Stand 5: Supervision;With upper extremity assist;With armrests;From chair/3-in-1  to walker for stabilization   Sit to Stand Details (indicate cue type and reason) arise from 29" stool without UE assist with min guard   Stand to Sit 5: Supervision;With upper extremity assist;With armrests;To chair/3-in-1  from walker for stabilization   Ambulation/Gait   Ambulation/Gait Yes   Ambulation/Gait Assistance 4: Min assist;5: Supervision  Supervision with RW, min assist with  crutches   Ambulation/Gait Assistance Details verbal / demo cues with crutches   Ambulation Distance (Feet) 120 Feet  2 X with rolling walker, 80 X 2 with crutches   Assistive device Rolling walker;Prostheses;Lofstrands   Gait Pattern Step-through pattern;Decreased step length - right;Decreased step length - left;Decreased stride length;Decreased hip/knee flexion - right;Decreased hip/knee flexion - left;Right hip hike;Left hip hike;Right flexed knee in stance;Left flexed knee in stance;Trunk flexed;Wide base of support   Ambulation Surface Level;Indoor   Stairs Yes   Stairs Assistance 4: Min assist   Stairs Assistance Details (indicate cue type and reason) cues on technique    Stair Management Technique Two rails;Step to pattern;Forwards  prostheses, RLE led 2 reps, LLE led 2 reps   Number of Stairs 4   Ramp 3: Mod assist  RW & prostheses   Ramp Details (indicate cue type and reason) verbal cues on technique   Curb 3: Mod assist  RW & prostheses   Curb Details (indicate cue type and reason) verbal cues on technique   Dynamic Standing Balance   Dynamic Standing - Balance Support No upper extremity supported   Dynamic Standing - Level of Assistance 4: Min assist   Dynamic Standing - Balance Activities Reaching for objects  red theraband reciprocal & BUE row, upward & forward reach   High Level Balance   High Level Balance Activities Side stepping;Marching forwards;Marching backwards  in parallel bars   Prosthetics   Prosthetic Care Comments  --   Current prosthetic wear tolerance (days/week)  PT recommended continue daily wear   Current prosthetic wear tolerance (#hours/day)  drying limb / liner, signs of sweating   Current prosthetic weight-bearing tolerance (hours/day)  15 minutes with UE support   Edema non-pitting   Residual limb condition  no open areas   Education Provided Residual limb care;Proper wear schedule/adjustment   Person(s) Educated Patient   Education Method  Explanation   Education Method Verbalized understanding   Donning Prosthesis Modified independent (device/increased time)   Doffing Prosthesis Modified independent (device/increased time)                PT Education - 08/31/14 1015    Education provided Yes   Education Details see prosthetic care   Person(s) Educated Patient   Methods Explanation;Demonstration   Comprehension Verbalized understanding;Returned demonstration          PT Short Term Goals - 07/23/14 1100    PT SHORT TERM GOAL #1   Title Patient verbalizes proper prosthetic care except cues on adjusting ply socks. (Target Date: 4th visit after Eval)   Baseline Patient is dependent in all aspects of prosthetic care.   Status New   PT SHORT TERM GOAL #2   Title Patient tolerates wear >75%  of awake hours without changes in skin integrity or pain / discomfort. (Target Date: 4th visit after Eval)   Baseline Patient's prostheses were delivered 2 days ago and he has progressed wear too rapidly placing him at risk of skin issues &/or pain.   Status New   PT SHORT TERM GOAL #3   Title Sit to/from stand wc to rolling walker and reaches 10" with walker support modified independent. (Target Date: 4th visit after Eval)   Baseline Sit to/from stand wc to rolling walker with minimal assist and reaches 5" with walker support with minimal assist.    Status New   PT SHORT TERM GOAL #4   Title Patient ambulates 125' with rolling walker & prostheses with supervision. (Target Date: 4th visit after Eval)   Baseline Patient ambulates 86' with rolling walker & prostheses with minimal assist.    Status New   PT SHORT TERM GOAL #5   Title Patient negotiates stairs with 2 rails & prostheses with minimal assist. (Target Date: 4th visit after Eval)   Baseline Patient is unable to negotiate barriers with prostheses yet.   Status New           PT Long Term Goals - 07/23/14 1100    PT LONG TERM GOAL #1   Title Patient is  independent in prosthetic care. (Target Date: 8th visit after Eval)   Baseline Patient is dependent in all aspects of prosthetic care.   Time 3   Period Months   Status New   PT LONG TERM GOAL #2   Title Patient tolerates wear of prostheses >90% of awake hours without change in skin integrity or pain / discomfort. (Target Date: 8th visit after Eval)   Baseline Patient recieved prostheses 2 days ago & is progressing wear too rapidly placing him at risk of skin integrity issues &/or pain / discomfort.   Time 3   Period Months   Status New   PT LONG TERM GOAL #3   Title Patient is independent with standing balance with occasional touch to stabalize managing clothes (pants for toileting & jackets) and reaching 10", in upper /lower cabinetry and to floor modified independent. (Target Date: 8th visit after Eval)   Baseline Patient requires minimal assist for sit to/from stand and reaching 5" with rolling walker & prostheses.   Time 3   Status New   PT LONG TERM GOAL #4   Title Patient ambulates 250' with LRAD & prostheses modified independent. (Target Date: 8th visit after Eval)   Baseline Patient ambulates 72' with rolling walker & prostheses with minimal assist.   Time 3   Period Months   Status New   PT LONG TERM GOAL #5   Title Patient negotiates ramp, curb & stairs with LRAD & prostheses modified independent. (Target Date: 8th visit after Eval)   Baseline Patient is unknowledgeable / dependent & unable to ambulate /negotiate barriers with prostheses.   Time 3   Period Months   Status New               Plan - 08/31/14 1015    Clinical Impression Statement Patient's residual limb has healed. Patient seems to understand negotiating barriers better.   Pt will benefit from skilled therapeutic intervention in order to improve on the following deficits Abnormal gait;Decreased activity tolerance;Decreased balance;Decreased endurance;Decreased knowledge of use of DME;Decreased  mobility;Other (comment)  prosthetic dependency   Rehab Potential Good   PT Frequency 1x / week   PT Duration 8 weeks  over 3 month period   PT Treatment/Interventions ADLs/Self Care Home Management;DME Instruction;Gait training;Stair training;Functional mobility training;Therapeutic activities;Therapeutic exercise;Balance training;Neuromuscular re-education;Patient/family education;Other (comment)  prosthetic training   PT Next Visit Plan review prosthetic care, gait with rolling walker & crutches   Consulted and Agree with Plan of Care Patient        Problem List Patient Active Problem List   Diagnosis Date Noted  . Cough 08/12/2014  . Bilateral hand pain 05/29/2014  . Acne 03/30/2014  . S/P bilateral BKA (below knee amputation) 01/22/2014  . Cocaine abuse 12/05/2013  . Tobacco abuse 12/04/2013  . Peripheral vascular disease   . Atrial fibrillation 12/03/2013  . 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 Personal history of colonic adenomas 05/29/2013  . Diabetic retinopathy 12/25/2012  . HLD (hyperlipidemia) 12/08/2012  . Peripheral neuropathy 11/19/2011  . ETOH abuse 03/29/2011  . HTN (hypertension) 12/22/2010  . Diabetes mellitus type II, uncontrolled 07/18/2006  . VENOUS INSUFFICIENCY 07/18/2006    Jamey Reas PT, DPT 08/31/2014, 6:38 PM  [facility] [address] [place], [place], [address] Phone: [phone]   Fax:  [phone]

## 2014-09-07 ENCOUNTER — Ambulatory Visit: Payer: Medicaid Other | Admitting: Physical Therapy

## 2014-09-07 ENCOUNTER — Encounter: Payer: Self-pay | Admitting: Physical Therapy

## 2014-09-07 DIAGNOSIS — R2689 Other abnormalities of gait and mobility: Secondary | ICD-10-CM

## 2014-09-07 DIAGNOSIS — R269 Unspecified abnormalities of gait and mobility: Secondary | ICD-10-CM | POA: Diagnosis not present

## 2014-09-07 DIAGNOSIS — Z7409 Other reduced mobility: Secondary | ICD-10-CM

## 2014-09-07 DIAGNOSIS — Z89512 Acquired absence of left leg below knee: Secondary | ICD-10-CM

## 2014-09-07 DIAGNOSIS — Z89511 Acquired absence of right leg below knee: Secondary | ICD-10-CM

## 2014-09-07 NOTE — Therapy (Signed)
[facility] [address] [place], [place], [address] Phone: [phone]   Fax:  [phone]  Physical Therapy Treatment  Patient Details  Name: Steven [name] MRN: [id number] Date of Birth: 1960-11-04 Referring Provider:  Leeanne Rio, MD  Encounter Date: 09/07/2014      PT End of Session - 09/07/14 1015    Visit Number 4   Number of Visits 9   Authorization Type Medicaid    Authorization Time Period 08/06/14 - 11/25/14   Authorization - Visit Number 3   Authorization - Number of Visits 8   PT Start Time 0934   PT Stop Time 1015   PT Time Calculation (min) 41 min   Equipment Utilized During Treatment Gait belt   Activity Tolerance Patient tolerated treatment well   Behavior During Therapy [facility] for tasks assessed/performed      Past Medical History  Diagnosis Date  . Uncontrolled diabetes mellitus   . HTN (hypertension)   . History of DVT (deep vein thrombosis)   . Personal history of diabetic foot ulcer   . Peripheral vascular disease     a. Abnl ABIs 2014.  Marland Kitchen Peripheral neuropathy 11/19/2011  . Varicose vein     legs  . Osteomyelitis of foot, right, acute 08/30/2012  . Personal history of colonic adenomas 05/29/2013  . History of cocaine use   . PAF (paroxysmal atrial fibrillation)     a. Dx 11/2013 in setting of sepsis/foot infection.  . Tobacco abuse   . H/O ETOH abuse   . Dysrhythmia     afib last admission  . Seasonal allergies     Past Surgical History  Procedure Laterality Date  . Amputation  04/21/2011    Procedure: AMPUTATION RAY;  Surgeon: Newt Minion, MD;  Location: [place];  Service: Orthopedics;  Laterality: Right;  Rt foot 2nd ray ampt  . I&d extremity  05/03/2011    Procedure: IRRIGATION AND DEBRIDEMENT EXTREMITY;  Surgeon: Newt Minion, MD;  Location: [place];  Service: Orthopedics;  Laterality: Right;  Irrigation and Debridement Right Foot and Place Acell Xenograft  . Toe  amputation  04/24/2012    great toe   right foot  . Amputation  04/24/2012    Procedure: AMPUTATION RAY;  Surgeon: Newt Minion, MD;  Location: [place];  Service: Orthopedics;  Laterality: Right;  Right foot 1st ray amputation  . Amputation Left 04/23/2013    Procedure: AMPUTATION RAY;  Surgeon: Newt Minion, MD;  Location: [place];  Service: Orthopedics;  Laterality: Left;  Left Foot 5th Ray Amputation  . Colonoscopy    . Amputation Left 11/21/2013    Procedure: AMPUTATION RAY;  Surgeon: Newt Minion, MD;  Location: [place];  Service: Orthopedics;  Laterality: Left;  Left Foot 1st & 2nd Ray Amputation  . Amputation Right 12/04/2013    Procedure: AMPUTATION BELOW KNEE;  Surgeon: Marianna Payment, MD;  Location: [place];  Service: Orthopedics;  Laterality: Right;  . Amputation Left 12/29/2013    Procedure: LEFT AMPUTATION BELOW KNEE;  Surgeon: Marianna Payment, MD;  Location: [place];  Service: Orthopedics;  Laterality: Left;    There were no vitals filed for this visit.  Visit Diagnosis:  Abnormality of gait  Impaired functional mobility and activity tolerance  Status post bilateral below knee amputation  Balance problems      Subjective Assessment - 09/07/14 0933    Symptoms wearing prostheses daily from 1/2 day to  all day.   Currently in Pain? No/denies                       [facility] Adult PT Treatment/Exercise - 09/07/14 0934    Transfers   Transfers Sit to Stand;Stand to Sit   Sit to Stand 5: Supervision;With upper extremity assist;With armrests;From chair/3-in-1  to walker/counter with goal not to touch for stabilization   Sit to Stand Details (indicate cue type and reason) cues on technique   Stand to Sit 5: Supervision;With upper extremity assist;With armrests;To chair/3-in-1  goal not to use counter/walker for stabilization   Stand to Sit Details cues on technique   Ambulation/Gait   Ambulation/Gait Yes   Ambulation/Gait Assistance 4: Min assist;5: Supervision   Supervision with RW, min assist with crutches   Ambulation Distance (Feet) 150 Feet  1 X with rolling walker, 150' X 1 with crutches   Assistive device Rolling walker;Prostheses;Lofstrands   Gait Pattern Step-through pattern;Decreased step length - right;Decreased step length - left;Decreased stride length;Decreased hip/knee flexion - right;Decreased hip/knee flexion - left;Right hip hike;Left hip hike;Right flexed knee in stance;Left flexed knee in stance;Trunk flexed;Wide base of support   Ambulation Surface Level;Indoor   Stairs Yes   Stairs Assistance 4: Min guard;5: Supervision   Stairs Assistance Details (indicate cue type and reason) cues on wt shift and technique with 1 rail   Stair Management Technique Two rails;Step to pattern;Forwards;One rail Right;One rail Left;With crutches  prostheses 6 reps, varying rail side, and lead limb   Number of Stairs 4  6 reps   Ramp 4: Min assist  RW & prostheses   Ramp Details (indicate cue type and reason) verbal cues on technique   Curb 4: Min assist  RW & prostheses   Curb Details (indicate cue type and reason) verbal cues on technique   Dynamic Standing Balance   Dynamic Standing - Balance Support No upper extremity supported   Dynamic Standing - Level of Assistance 4: Min assist   Dynamic Standing - Balance Activities Reaching for objects   High Level Balance   High Level Balance Activities Side stepping;Marching forwards;Marching backwards;Braiding;Tandem walking  counter & 1 crutch   Prosthetics   Current prosthetic wear tolerance (days/week)  PT recommended continue daily wear   Current prosthetic wear tolerance (#hours/day)  drying limb / liner, signs of sweating   Current prosthetic weight-bearing tolerance (hours/day)  15 minutes with UE support   Edema non-pitting   Residual limb condition  no open areas   Education Provided Residual limb care;Proper wear schedule/adjustment   Person(s) Educated Patient   Education Method  Explanation   Education Method Verbalized understanding   Donning Prosthesis Modified independent (device/increased time)   Doffing Prosthesis Modified independent (device/increased time)                PT Education - 09/07/14 1015    Education provided Yes   Education Details see prosthetic care, increase activity level with increased frequency of gait in home with walking instead of w/c use for all activities including toileting in home   Person(s) Educated Patient   Methods Explanation   Comprehension Verbalized understanding          PT Short Term Goals - 07/23/14 1100    PT SHORT TERM GOAL #1   Title Patient verbalizes proper prosthetic care except cues on adjusting ply socks. (Target Date: 4th visit after Eval)   Baseline Patient is dependent in all aspects of prosthetic care.  Status New   PT SHORT TERM GOAL #2   Title Patient tolerates wear >75% of awake hours without changes in skin integrity or pain / discomfort. (Target Date: 4th visit after Eval)   Baseline Patient's prostheses were delivered 2 days ago and he has progressed wear too rapidly placing him at risk of skin issues &/or pain.   Status New   PT SHORT TERM GOAL #3   Title Sit to/from stand wc to rolling walker and reaches 10" with walker support modified independent. (Target Date: 4th visit after Eval)   Baseline Sit to/from stand wc to rolling walker with minimal assist and reaches 5" with walker support with minimal assist.    Status New   PT SHORT TERM GOAL #4   Title Patient ambulates 125' with rolling walker & prostheses with supervision. (Target Date: 4th visit after Eval)   Baseline Patient ambulates 66' with rolling walker & prostheses with minimal assist.    Status New   PT SHORT TERM GOAL #5   Title Patient negotiates stairs with 2 rails & prostheses with minimal assist. (Target Date: 4th visit after Eval)   Baseline Patient is unable to negotiate barriers with prostheses yet.   Status New            PT Long Term Goals - 07/23/14 1100    PT LONG TERM GOAL #1   Title Patient is independent in prosthetic care. (Target Date: 8th visit after Eval)   Baseline Patient is dependent in all aspects of prosthetic care.   Time 3   Period Months   Status New   PT LONG TERM GOAL #2   Title Patient tolerates wear of prostheses >90% of awake hours without change in skin integrity or pain / discomfort. (Target Date: 8th visit after Eval)   Baseline Patient recieved prostheses 2 days ago & is progressing wear too rapidly placing him at risk of skin integrity issues &/or pain / discomfort.   Time 3   Period Months   Status New   PT LONG TERM GOAL #3   Title Patient is independent with standing balance with occasional touch to stabalize managing clothes (pants for toileting & jackets) and reaching 10", in upper /lower cabinetry and to floor modified independent. (Target Date: 8th visit after Eval)   Baseline Patient requires minimal assist for sit to/from stand and reaching 5" with rolling walker & prostheses.   Time 3   Status New   PT LONG TERM GOAL #4   Title Patient ambulates 250' with LRAD & prostheses modified independent. (Target Date: 8th visit after Eval)   Baseline Patient ambulates 78' with rolling walker & prostheses with minimal assist.   Time 3   Period Months   Status New   PT LONG TERM GOAL #5   Title Patient negotiates ramp, curb & stairs with LRAD & prostheses modified independent. (Target Date: 8th visit after Eval)   Baseline Patient is unknowledgeable / dependent & unable to ambulate /negotiate barriers with prostheses.   Time 3   Period Months   Status New               Plan - 09/07/14 1015    Clinical Impression Statement patient is on target to meet STGs by next week. His knees appear unstable at times when he fatigues and strength makes barriers more unstable.   Pt will benefit from skilled therapeutic intervention in order to improve on the  following deficits Abnormal gait;Decreased activity tolerance;Decreased balance;Decreased  endurance;Decreased knowledge of use of DME;Decreased mobility;Other (comment)  prosthetic dependency   Rehab Potential Good   PT Frequency 1x / week   PT Duration 8 weeks  over 3 month period   PT Treatment/Interventions ADLs/Self Care Home Management;DME Instruction;Gait training;Stair training;Functional mobility training;Therapeutic activities;Therapeutic exercise;Balance training;Neuromuscular re-education;Patient/family education;Other (comment)  prosthetic training   PT Next Visit Plan assess STGs   Consulted and Agree with Plan of Care Patient        Problem List Patient Active Problem List   Diagnosis Date Noted  . Cough 08/12/2014  . Bilateral hand pain 05/29/2014  . Acne 03/30/2014  . S/P bilateral BKA (below knee amputation) 01/22/2014  . Cocaine abuse 12/05/2013  . Tobacco abuse 12/04/2013  . Peripheral vascular disease   . Atrial fibrillation 12/03/2013  . 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 Personal history of colonic adenomas 05/29/2013  . Diabetic retinopathy 12/25/2012  . HLD (hyperlipidemia) 12/08/2012  . Peripheral neuropathy 11/19/2011  . ETOH abuse 03/29/2011  . HTN (hypertension) 12/22/2010  . Diabetes mellitus type II, uncontrolled 07/18/2006  . VENOUS INSUFFICIENCY 07/18/2006    Jamey Reas PT, DPT 09/07/2014, 1:11 PM  [facility] [address] [place], [place], [address] Phone: [phone]   Fax:  [phone]

## 2014-09-09 ENCOUNTER — Ambulatory Visit (INDEPENDENT_AMBULATORY_CARE_PROVIDER_SITE_OTHER): Payer: Medicaid Other | Admitting: Family Medicine

## 2014-09-09 ENCOUNTER — Encounter: Payer: Self-pay | Admitting: Family Medicine

## 2014-09-09 VITALS — BP 131/82 | HR 82 | Temp 98.5°F | Ht 76.0 in

## 2014-09-09 DIAGNOSIS — R05 Cough: Secondary | ICD-10-CM

## 2014-09-09 DIAGNOSIS — R059 Cough, unspecified: Secondary | ICD-10-CM

## 2014-09-09 DIAGNOSIS — E1165 Type 2 diabetes mellitus with hyperglycemia: Secondary | ICD-10-CM

## 2014-09-09 DIAGNOSIS — IMO0002 Reserved for concepts with insufficient information to code with codable children: Secondary | ICD-10-CM

## 2014-09-09 MED ORDER — INSULIN GLARGINE 100 UNIT/ML ~~LOC~~ SOLN: 55.0000 [IU] | Freq: Every day | SUBCUTANEOUS | Status: DC

## 2014-09-09 MED ORDER — FLUTICASONE PROPIONATE 50 MCG/ACT NA SUSP: 2.0000 | Freq: Every day | NASAL | Status: DC

## 2014-09-09 NOTE — Assessment & Plan Note (Signed)
Sugars continue to be elevated. Will increase Lantus to 55 units nightly. He will follow-up with me in 3 weeks. He'll be due for an A1c at this time. Once we get his fasting sugars down, we'll likely proceed with adding meal time insulin.

## 2014-09-09 NOTE — Progress Notes (Signed)
I was preceptor for this office visit.  

## 2014-09-09 NOTE — Assessment & Plan Note (Signed)
Suspect secondary to nasal congestion. Improved slightly with Zyrtec. Add Flonase. Follow-up in 3 weeks to evaluate for improvement.

## 2014-09-09 NOTE — Patient Instructions (Signed)
Increase lantus to 55 units nightly Follow up in 3 weeks We'll check A1c then  Start flonase daily Let's see if this helps your cough.  Be well, Dr. [name]

## 2014-09-09 NOTE — Addendum Note (Signed)
Addended by: Leeanne Rio on: 09/09/2014 03:13 PM   Modules accepted: Level of Service

## 2014-09-09 NOTE — Progress Notes (Signed)
Patient ID: Steven [name], male   DOB: 1961-03-22, 54 y.o.   MRN: [id number]  HPI:  Follow-up diabetes: Patient is currently taking Lantus 49 units at night. He has brought his blood sugar chart with him. Fasting blood sugars range from around 120 up to over 200. His sugars later in the day tend to run from the 150s up to 250s. He does not do any mealtime insulin. He denies any chest pain or shortness of breath.  Nighttime cough: This is mildly improved with the addition of Zyrtec to his medicine regimen. He still has a nighttime cough and some nasal congestion. He is amenable to trying an intranasal steroid.  ROS: See HPI.  [place]: Hypertension, peripheral vascular disease, A. fib, hyperlipidemia, diabetes, bilateral BKA's  PHYSICAL EXAM: BP 131/82 mmHg  Pulse 82  Temp(Src) 98.5 F (36.9 C) (Oral)  Ht 6\' 4"  (1.93 m)  Wt  Gen: No acute distress, pleasant, cooperative HEENT: Normocephalic, atraumatic, oropharynx clear and moist without exudate. Nares with some congestion. Heart: Regular rate and rhythm, no murmurs Lungs: Clear to auscultation bilaterally, normal respiratory effort Neuro: Grossly nonfocal, speech normal Ext: Status post bilateral BKA, with prostheses in place. Sitting in a motorized wheelchair.  ASSESSMENT/PLAN:  Diabetes mellitus type II, uncontrolled Sugars continue to be elevated. Will increase Lantus to 55 units nightly. He will follow-up with me in 3 weeks. He'll be due for an A1c at this time. Once we get his fasting sugars down, we'll likely proceed with adding meal time insulin.   Cough Suspect secondary to nasal congestion. Improved slightly with Zyrtec. Add Flonase. Follow-up in 3 weeks to evaluate for improvement.    FOLLOW UP: F/u in 3 weeks for diabetes and nighttime cough  [place] J. [name], [place]

## 2014-09-14 ENCOUNTER — Ambulatory Visit: Payer: Medicaid Other | Attending: Orthopaedic Surgery | Admitting: Physical Therapy

## 2014-09-14 ENCOUNTER — Encounter: Payer: Self-pay | Admitting: Physical Therapy

## 2014-09-14 DIAGNOSIS — R2689 Other abnormalities of gait and mobility: Secondary | ICD-10-CM

## 2014-09-14 DIAGNOSIS — Z89512 Acquired absence of left leg below knee: Secondary | ICD-10-CM | POA: Diagnosis not present

## 2014-09-14 DIAGNOSIS — Z7409 Other reduced mobility: Secondary | ICD-10-CM | POA: Diagnosis not present

## 2014-09-14 DIAGNOSIS — Z89511 Acquired absence of right leg below knee: Secondary | ICD-10-CM | POA: Diagnosis not present

## 2014-09-14 DIAGNOSIS — R29818 Other symptoms and signs involving the nervous system: Secondary | ICD-10-CM | POA: Diagnosis not present

## 2014-09-14 DIAGNOSIS — R269 Unspecified abnormalities of gait and mobility: Secondary | ICD-10-CM | POA: Diagnosis not present

## 2014-09-14 NOTE — Therapy (Signed)
[facility] [address] [place], [place], [address] Phone: [phone]   Fax:  [phone]  Physical Therapy Treatment  Patient Details  Name: Steven [name] MRN: [id number] Date of Birth: 1961-06-12 Referring Provider:  Leeanne Rio, MD  Encounter Date: 09/14/2014      PT End of Session - 09/14/14 1015    Visit Number 5   Number of Visits 9   Authorization Type Medicaid    Authorization Time Period 08/06/14 - 11/25/14   Authorization - Visit Number 4   Authorization - Number of Visits 8   PT Start Time 1015   PT Stop Time 1058   PT Time Calculation (min) 43 min   Equipment Utilized During Treatment Gait belt   Activity Tolerance Patient tolerated treatment well   Behavior During Therapy [facility] for tasks assessed/performed      Past Medical History  Diagnosis Date  . Uncontrolled diabetes mellitus   . HTN (hypertension)   . History of DVT (deep vein thrombosis)   . Personal history of diabetic foot ulcer   . Peripheral vascular disease     a. Abnl ABIs 2014.  Marland Kitchen Peripheral neuropathy 11/19/2011  . Varicose vein     legs  . Osteomyelitis of foot, right, acute 08/30/2012  . Personal history of colonic adenomas 05/29/2013  . History of cocaine use   . PAF (paroxysmal atrial fibrillation)     a. Dx 11/2013 in setting of sepsis/foot infection.  . Tobacco abuse   . H/O ETOH abuse   . Dysrhythmia     afib last admission  . Seasonal allergies     Past Surgical History  Procedure Laterality Date  . Amputation  04/21/2011    Procedure: AMPUTATION RAY;  Surgeon: Newt Minion, MD;  Location: [place];  Service: Orthopedics;  Laterality: Right;  Rt foot 2nd ray ampt  . I&d extremity  05/03/2011    Procedure: IRRIGATION AND DEBRIDEMENT EXTREMITY;  Surgeon: Newt Minion, MD;  Location: [place];  Service: Orthopedics;  Laterality: Right;  Irrigation and Debridement Right Foot and Place Acell Xenograft  . Toe amputation   04/24/2012    great toe   right foot  . Amputation  04/24/2012    Procedure: AMPUTATION RAY;  Surgeon: Newt Minion, MD;  Location: [place];  Service: Orthopedics;  Laterality: Right;  Right foot 1st ray amputation  . Amputation Left 04/23/2013    Procedure: AMPUTATION RAY;  Surgeon: Newt Minion, MD;  Location: [place];  Service: Orthopedics;  Laterality: Left;  Left Foot 5th Ray Amputation  . Colonoscopy    . Amputation Left 11/21/2013    Procedure: AMPUTATION RAY;  Surgeon: Newt Minion, MD;  Location: [place];  Service: Orthopedics;  Laterality: Left;  Left Foot 1st & 2nd Ray Amputation  . Amputation Right 12/04/2013    Procedure: AMPUTATION BELOW KNEE;  Surgeon: Marianna Payment, MD;  Location: [place];  Service: Orthopedics;  Laterality: Right;  . Amputation Left 12/29/2013    Procedure: LEFT AMPUTATION BELOW KNEE;  Surgeon: Marianna Payment, MD;  Location: [place];  Service: Orthopedics;  Laterality: Left;    There were no vitals filed for this visit.  Visit Diagnosis:  Abnormality of gait  Impaired functional mobility and activity tolerance  Status post bilateral below knee amputation  Balance problems      Subjective Assessment - 09/14/14 1023    Subjective wearing prostheses all awake hours without issues.  Currently in Pain? No/denies                       [facility] Adult PT Treatment/Exercise - 09/14/14 1015    Transfers   Transfers Sit to Stand;Stand to Sit   Sit to Stand 5: Supervision;With upper extremity assist;With armrests;From chair/3-in-1  to walker/counter with goal not to touch for stabilization   Stand to Sit 5: Supervision;With upper extremity assist;With armrests;To chair/3-in-1  goal not to use counter/walker for stabilization   Ambulation/Gait   Ambulation/Gait Yes   Ambulation/Gait Assistance 6: Modified independent (Device/Increase time);4: Min assist  Modified Independent with RW, MinA w/ crutches   Ambulation Distance (Feet) 250 Feet   1 X with rolling walker, 200' X 1 with crutches   Assistive device Rolling walker;Prostheses;Lofstrands   Gait Pattern Step-through pattern;Trunk flexed;Wide base of support   Ambulation Surface Level;Indoor;Outdoor;Unlevel;Paved;Gravel;Grass   Stairs Yes   Stairs Assistance 5: Supervision   Stair Management Technique Two rails;Step to pattern;Forwards;One rail Right;One rail Left;With crutches  prostheses 6 reps, varying rail side, and lead limb   Number of Stairs 4  3 reps   Ramp 5: Supervision  RW & prostheses   Curb 5: Supervision  RW & prostheses   Dynamic Standing Balance   Dynamic Standing - Balance Support No upper extremity supported   Dynamic Standing - Level of Assistance 4: Min assist   Dynamic Standing - Balance Activities Reaching for objects   High Level Balance   High Level Balance Activities Side stepping;Marching forwards;Backward walking  counter & 1 crutch   Prosthetics   Current prosthetic wear tolerance (days/week)  reports daily wear   Current prosthetic wear tolerance (#hours/day)  all awake hours   Current prosthetic weight-bearing tolerance (hours/day)  15 minutes with UE support   Edema non-pitting   Residual limb condition  no open areas   Education Provided Correct ply sock adjustment   Person(s) Educated Patient   Education Method Explanation   Education Method Verbalized understanding     Patient ambulated 78' & 115' with single crutch with min A.           PT Education - 09/14/14 1015    Education provided Yes   Education Details continue to increase activity level with increased frequency   Person(s) Educated Patient   Methods Explanation   Comprehension Verbalized understanding          PT Short Term Goals - 09/14/14 1015    PT SHORT TERM GOAL #1   Title Patient verbalizes proper prosthetic care except cues on adjusting ply socks. (Target Date: 4th visit after Eval)   Baseline MET 09/14/14   Status Achieved   PT SHORT TERM  GOAL #2   Title Patient tolerates wear >75% of awake hours without changes in skin integrity or pain / discomfort. (Target Date: 4th visit after Eval)   Baseline MET 09/14/14    Status Achieved   PT SHORT TERM GOAL #3   Title Sit to/from stand wc to rolling walker and reaches 10" with walker support modified independent. (Target Date: 4th visit after Eval)   Baseline Sit to/from stand wc to rolling walker with minimal assist and reaches 5" with walker support with minimal assist.    Status New   PT SHORT TERM GOAL #4   Title Patient ambulates 125' with rolling walker & prostheses with supervision. (Target Date: 4th visit after Eval)   Baseline MET 09/14/14   Status Achieved   PT  SHORT TERM GOAL #5   Title Patient negotiates stairs with 2 rails & prostheses with minimal assist. (Target Date: 4th visit after Eval)   Baseline MET 09/14/14   Status Achieved           PT Long Term Goals - 07/23/14 1100    PT LONG TERM GOAL #1   Title Patient is independent in prosthetic care. (Target Date: 8th visit after Eval)   Baseline Patient is dependent in all aspects of prosthetic care.   Time 3   Period Months   Status New   PT LONG TERM GOAL #2   Title Patient tolerates wear of prostheses >90% of awake hours without change in skin integrity or pain / discomfort. (Target Date: 8th visit after Eval)   Baseline Patient recieved prostheses 2 days ago & is progressing wear too rapidly placing him at risk of skin integrity issues &/or pain / discomfort.   Time 3   Period Months   Status New   PT LONG TERM GOAL #3   Title Patient is independent with standing balance with occasional touch to stabalize managing clothes (pants for toileting & jackets) and reaching 10", in upper /lower cabinetry and to floor modified independent. (Target Date: 8th visit after Eval)   Baseline Patient requires minimal assist for sit to/from stand and reaching 5" with rolling walker & prostheses.   Time 3   Status New   PT  LONG TERM GOAL #4   Title Patient ambulates 250' with LRAD & prostheses modified independent. (Target Date: 8th visit after Eval)   Baseline Patient ambulates 76' with rolling walker & prostheses with minimal assist.   Time 3   Period Months   Status New   PT LONG TERM GOAL #5   Title Patient negotiates ramp, curb & stairs with LRAD & prostheses modified independent. (Target Date: 8th visit after Eval)   Baseline Patient is unknowledgeable / dependent & unable to ambulate /negotiate barriers with prostheses.   Time 3   Period Months   Status New               Plan - 09/14/14 1015    Clinical Impression Statement Patient met all STGs. Patient is improving mobility with prostheses.   Pt will benefit from skilled therapeutic intervention in order to improve on the following deficits Abnormal gait;Decreased activity tolerance;Decreased balance;Decreased endurance;Decreased knowledge of use of DME;Decreased mobility;Other (comment)  prosthetic dependency   Rehab Potential Good   PT Frequency 1x / week   PT Duration 8 weeks  over 3 month period   PT Treatment/Interventions ADLs/Self Care Home Management;DME Instruction;Gait training;Stair training;Functional mobility training;Therapeutic activities;Therapeutic exercise;Balance training;Neuromuscular re-education;Patient/family education;Other (comment)  prosthetic training   PT Next Visit Plan continue towards LTGs   Consulted and Agree with Plan of Care Patient        Problem List Patient Active Problem List   Diagnosis Date Noted  . Cough 08/12/2014  . Bilateral hand pain 05/29/2014  . Acne 03/30/2014  . S/P bilateral BKA (below knee amputation) 01/22/2014  . Cocaine abuse 12/05/2013  . Tobacco abuse 12/04/2013  . Peripheral vascular disease   . Atrial fibrillation 12/03/2013  . 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 Personal history of colonic adenomas 05/29/2013  . Diabetic retinopathy 12/25/2012  . HLD  (hyperlipidemia) 12/08/2012  . Peripheral neuropathy 11/19/2011  . ETOH abuse 03/29/2011  . HTN (hypertension) 12/22/2010  . Diabetes mellitus type II, uncontrolled 07/18/2006  . VENOUS INSUFFICIENCY 07/18/2006    Jamey Reas  PT, DPT 09/14/2014, 11:14 PM  [name] [address] [place], [place], [address] Phone: [phone]   Fax:  [phone]

## 2014-09-21 ENCOUNTER — Encounter: Payer: Self-pay | Admitting: Physical Therapy

## 2014-09-21 ENCOUNTER — Ambulatory Visit: Payer: Medicaid Other | Admitting: Physical Therapy

## 2014-09-21 DIAGNOSIS — R269 Unspecified abnormalities of gait and mobility: Secondary | ICD-10-CM | POA: Diagnosis not present

## 2014-09-21 DIAGNOSIS — Z7409 Other reduced mobility: Secondary | ICD-10-CM

## 2014-09-21 NOTE — Therapy (Signed)
[facility] [address] [place], [place], [address] Phone: [phone]   Fax:  [phone]  Physical Therapy Treatment  Patient Details  Name: Steven [name] MRN: [id number] Date of Birth: 05/16/1961 Referring Provider:  Leeanne Rio, MD  Encounter Date: 09/21/2014      PT End of Session - 09/21/14 1023    Visit Number 6   Number of Visits 9   Authorization Type Medicaid    Authorization Time Period 08/06/14 - 11/25/14   Authorization - Visit Number 5   Authorization - Number of Visits 8   PT Start Time 1017   PT Stop Time 1059   PT Time Calculation (min) 42 min   Equipment Utilized During Treatment Gait belt   Activity Tolerance Patient tolerated treatment well   Behavior During Therapy [facility] for tasks assessed/performed      Past Medical History  Diagnosis Date  . Uncontrolled diabetes mellitus   . HTN (hypertension)   . History of DVT (deep vein thrombosis)   . Personal history of diabetic foot ulcer   . Peripheral vascular disease     a. Abnl ABIs 2014.  Marland Kitchen Peripheral neuropathy 11/19/2011  . Varicose vein     legs  . Osteomyelitis of foot, right, acute 08/30/2012  . Personal history of colonic adenomas 05/29/2013  . History of cocaine use   . PAF (paroxysmal atrial fibrillation)     a. Dx 11/2013 in setting of sepsis/foot infection.  . Tobacco abuse   . H/O ETOH abuse   . Dysrhythmia     afib last admission  . Seasonal allergies     Past Surgical History  Procedure Laterality Date  . Amputation  04/21/2011    Procedure: AMPUTATION RAY;  Surgeon: Newt Minion, MD;  Location: [place];  Service: Orthopedics;  Laterality: Right;  Rt foot 2nd ray ampt  . I&d extremity  05/03/2011    Procedure: IRRIGATION AND DEBRIDEMENT EXTREMITY;  Surgeon: Newt Minion, MD;  Location: [place];  Service: Orthopedics;  Laterality: Right;  Irrigation and Debridement Right Foot and Place Acell Xenograft  . Toe  amputation  04/24/2012    great toe   right foot  . Amputation  04/24/2012    Procedure: AMPUTATION RAY;  Surgeon: Newt Minion, MD;  Location: [place];  Service: Orthopedics;  Laterality: Right;  Right foot 1st ray amputation  . Amputation Left 04/23/2013    Procedure: AMPUTATION RAY;  Surgeon: Newt Minion, MD;  Location: [place];  Service: Orthopedics;  Laterality: Left;  Left Foot 5th Ray Amputation  . Colonoscopy    . Amputation Left 11/21/2013    Procedure: AMPUTATION RAY;  Surgeon: Newt Minion, MD;  Location: [place];  Service: Orthopedics;  Laterality: Left;  Left Foot 1st & 2nd Ray Amputation  . Amputation Right 12/04/2013    Procedure: AMPUTATION BELOW KNEE;  Surgeon: Marianna Payment, MD;  Location: [place];  Service: Orthopedics;  Laterality: Right;  . Amputation Left 12/29/2013    Procedure: LEFT AMPUTATION BELOW KNEE;  Surgeon: Marianna Payment, MD;  Location: [place];  Service: Orthopedics;  Laterality: Left;    There were no vitals filed for this visit.  Visit Diagnosis:  Abnormality of gait  Impaired functional mobility and activity tolerance      Subjective Assessment - 09/21/14 1022    Subjective No new compliants. No falls or pain to report.   Currently in Pain? No/denies  Pain Score 0-No pain          [facility] Adult PT Treatment/Exercise - 09/21/14 1024    Ambulation/Gait   Ambulation/Gait Yes   Ambulation/Gait Assistance 4: Min assist;4: Min guard   Ambulation Distance (Feet) 220 Feet   Assistive device Lofstrands;Prostheses   Gait Pattern Step-through pattern;Trunk flexed;Wide base of support   Stairs Yes   Stairs Assistance 5: Supervision;4: Min guard   Stair Management Technique Two rails;Alternating pattern;Forwards   Number of Stairs 4  x 4 reps   Ramp 5: Supervision  walker/prostheses   Curb 5: Supervision  walker/prostheses   Prosthetics   Prosthetic Care Comments  Pt educated to keep blisters covered with Tegaderm while wearing liners, open to  air at night when not wearing liners and to dry frequently during day to prevent more blisters from forming.                       Current prosthetic wear tolerance (days/week)  daily   Current prosthetic wear tolerance (#hours/day)  all awake hours   Residual limb condition  open blister on right posterior/inner thigh at top/border of where liner lies and on left inner knee area. Both covered with Tegaderm.                             Education Provided Residual limb care;Correct ply sock adjustment;Proper wear schedule/adjustment  drying throught out day to prevent blisters   Person(s) Educated Patient   Education Method Explanation;Demonstration   Education Method Verbalized understanding   Donning Prosthesis Modified independent (device/increased time)   Doffing Prosthesis Modified independent (device/increased time)   Knee/Hip Exercises: Machines for Strengthening   Cybex Leg Press both legs, 80# 5 sec hold 2 sets of 10 reps. cues on hold and ex form/technique.                       PT Short Term Goals - 09/14/14 1015    PT SHORT TERM GOAL #1   Title Patient verbalizes proper prosthetic care except cues on adjusting ply socks. (Target Date: 4th visit after Eval)   Baseline MET 09/14/14   Status Achieved   PT SHORT TERM GOAL #2   Title Patient tolerates wear >75% of awake hours without changes in skin integrity or pain / discomfort. (Target Date: 4th visit after Eval)   Baseline MET 09/14/14    Status Achieved   PT SHORT TERM GOAL #3   Title Sit to/from stand wc to rolling walker and reaches 10" with walker support modified independent. (Target Date: 4th visit after Eval)   Baseline Sit to/from stand wc to rolling walker with minimal assist and reaches 5" with walker support with minimal assist.    Status New   PT SHORT TERM GOAL #4   Title Patient ambulates 125' with rolling walker & prostheses with supervision. (Target Date: 4th visit after Eval)   Baseline MET 09/14/14   Status  Achieved   PT SHORT TERM GOAL #5   Title Patient negotiates stairs with 2 rails & prostheses with minimal assist. (Target Date: 4th visit after Eval)   Baseline MET 09/14/14   Status Achieved           PT Long Term Goals - 07/23/14 1100    PT LONG TERM GOAL #1   Title Patient is independent in prosthetic care. (Target Date: 8th visit after Eval)  Baseline Patient is dependent in all aspects of prosthetic care.   Time 3   Period Months   Status New   PT LONG TERM GOAL #2   Title Patient tolerates wear of prostheses >90% of awake hours without change in skin integrity or pain / discomfort. (Target Date: 8th visit after Eval)   Baseline Patient recieved prostheses 2 days ago & is progressing wear too rapidly placing him at risk of skin integrity issues &/or pain / discomfort.   Time 3   Period Months   Status New   PT LONG TERM GOAL #3   Title Patient is independent with standing balance with occasional touch to stabalize managing clothes (pants for toileting & jackets) and reaching 10", in upper /lower cabinetry and to floor modified independent. (Target Date: 8th visit after Eval)   Baseline Patient requires minimal assist for sit to/from stand and reaching 5" with rolling walker & prostheses.   Time 3   Status New   PT LONG TERM GOAL #4   Title Patient ambulates 250' with LRAD & prostheses modified independent. (Target Date: 8th visit after Eval)   Baseline Patient ambulates 74' with rolling walker & prostheses with minimal assist.   Time 3   Period Months   Status New   PT LONG TERM GOAL #5   Title Patient negotiates ramp, curb & stairs with LRAD & prostheses modified independent. (Target Date: 8th visit after Eval)   Baseline Patient is unknowledgeable / dependent & unable to ambulate /negotiate barriers with prostheses.   Time 3   Period Months   Status New           Plan - 09/21/14 1023    Clinical Impression Statement Pt making steady progress toward goals.   Pt  will benefit from skilled therapeutic intervention in order to improve on the following deficits Abnormal gait;Decreased activity tolerance;Decreased balance;Decreased endurance;Decreased knowledge of use of DME;Decreased mobility;Other (comment)  prosthetic dependency   Rehab Potential Good   PT Frequency 1x / week   PT Duration 8 weeks  over 3 month period   PT Treatment/Interventions ADLs/Self Care Home Management;DME Instruction;Gait training;Stair training;Functional mobility training;Therapeutic activities;Therapeutic exercise;Balance training;Neuromuscular re-education;Patient/family education;Other (comment)  prosthetic training   PT Next Visit Plan assess blistersl, continue towards LTGs   Consulted and Agree with Plan of Care Patient        Problem List Patient Active Problem List   Diagnosis Date Noted  . Cough 08/12/2014  . Bilateral hand pain 05/29/2014  . Acne 03/30/2014  . S/P bilateral BKA (below knee amputation) 01/22/2014  . Cocaine abuse 12/05/2013  . Tobacco abuse 12/04/2013  . Peripheral vascular disease   . Atrial fibrillation 12/03/2013  . 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 Personal history of colonic adenomas 05/29/2013  . Diabetic retinopathy 12/25/2012  . HLD (hyperlipidemia) 12/08/2012  . Peripheral neuropathy 11/19/2011  . ETOH abuse 03/29/2011  . HTN (hypertension) 12/22/2010  . Diabetes mellitus type II, uncontrolled 07/18/2006  . VENOUS INSUFFICIENCY 07/18/2006    Willow Ora 09/21/2014, 12:25 PM  Willow Ora, PTA, [facility] [address], [place], [place] [address] [phone] 09/21/2014, 12:25 PM

## 2014-09-25 ENCOUNTER — Other Ambulatory Visit: Payer: Self-pay | Admitting: *Deleted

## 2014-09-25 MED ORDER — PANTOPRAZOLE SODIUM 40 MG PO TBEC: 40.0000 mg | DELAYED_RELEASE_TABLET | Freq: Every day | ORAL | Status: DC

## 2014-09-28 ENCOUNTER — Encounter: Payer: Self-pay | Admitting: Physical Therapy

## 2014-09-28 ENCOUNTER — Encounter: Payer: Medicaid Other | Admitting: Physical Therapy

## 2014-09-28 ENCOUNTER — Ambulatory Visit: Payer: Medicaid Other | Admitting: Physical Therapy

## 2014-09-28 DIAGNOSIS — R2689 Other abnormalities of gait and mobility: Secondary | ICD-10-CM

## 2014-09-28 DIAGNOSIS — Z89512 Acquired absence of left leg below knee: Secondary | ICD-10-CM

## 2014-09-28 DIAGNOSIS — R269 Unspecified abnormalities of gait and mobility: Secondary | ICD-10-CM

## 2014-09-28 DIAGNOSIS — Z7409 Other reduced mobility: Secondary | ICD-10-CM

## 2014-09-28 DIAGNOSIS — Z89511 Acquired absence of right leg below knee: Secondary | ICD-10-CM

## 2014-09-28 NOTE — Therapy (Signed)
[facility] [address] [place], [place], [address] Phone: [phone]   Fax:  [phone]  Physical Therapy Treatment  Patient Details  Name: Steven [name] MRN: [id number] Date of Birth: [birth year] Referring Provider:  Leeanne Rio, MD  Encounter Date: 09/28/2014      PT End of Session - 09/28/14 0930    Visit Number 7   Number of Visits 9   Authorization Type Medicaid    Authorization Time Period 08/06/14 - 11/25/14   Authorization - Visit Number 6   Authorization - Number of Visits 8   PT Start Time 3546   PT Stop Time 1100   PT Time Calculation (min) 45 min   Equipment Utilized During Treatment Gait belt   Activity Tolerance Patient tolerated treatment well   Behavior During Therapy [facility] for tasks assessed/performed      Past Medical History  Diagnosis Date  . Uncontrolled diabetes mellitus   . HTN (hypertension)   . History of DVT (deep vein thrombosis)   . Personal history of diabetic foot ulcer   . Peripheral vascular disease     a. Abnl ABIs 2014.  Marland Kitchen Peripheral neuropathy 11/19/2011  . Varicose vein     legs  . Osteomyelitis of foot, right, acute 08/30/2012  . Personal history of colonic adenomas 05/29/2013  . History of cocaine use   . PAF (paroxysmal atrial fibrillation)     a. Dx 11/2013 in setting of sepsis/foot infection.  . Tobacco abuse   . H/O ETOH abuse   . Dysrhythmia     afib last admission  . Seasonal allergies     Past Surgical History  Procedure Laterality Date  . Amputation  04/21/2011    Procedure: AMPUTATION RAY;  Surgeon: Newt Minion, MD;  Location: [place];  Service: Orthopedics;  Laterality: Right;  Rt foot 2nd ray ampt  . I&d extremity  05/03/2011    Procedure: IRRIGATION AND DEBRIDEMENT EXTREMITY;  Surgeon: Newt Minion, MD;  Location: [place];  Service: Orthopedics;  Laterality: Right;  Irrigation and Debridement Right Foot and Place Acell Xenograft  . Toe  amputation  04/24/2012    great toe   right foot  . Amputation  04/24/2012    Procedure: AMPUTATION RAY;  Surgeon: Newt Minion, MD;  Location: [place];  Service: Orthopedics;  Laterality: Right;  Right foot 1st ray amputation  . Amputation Left 04/23/2013    Procedure: AMPUTATION RAY;  Surgeon: Newt Minion, MD;  Location: [place];  Service: Orthopedics;  Laterality: Left;  Left Foot 5th Ray Amputation  . Colonoscopy    . Amputation Left 11/21/2013    Procedure: AMPUTATION RAY;  Surgeon: Newt Minion, MD;  Location: [place];  Service: Orthopedics;  Laterality: Left;  Left Foot 1st & 2nd Ray Amputation  . Amputation Right 12/04/2013    Procedure: AMPUTATION BELOW KNEE;  Surgeon: Marianna Payment, MD;  Location: [place];  Service: Orthopedics;  Laterality: Right;  . Amputation Left 12/29/2013    Procedure: LEFT AMPUTATION BELOW KNEE;  Surgeon: Marianna Payment, MD;  Location: [place];  Service: Orthopedics;  Laterality: Left;    There were no vitals filed for this visit.  Visit Diagnosis:  Abnormality of gait  Impaired functional mobility and activity tolerance  Status post bilateral below knee amputation  Balance problems      Subjective Assessment - 09/28/14 1011    Subjective wears prostheses daily from arising to bed  time.    Currently in Pain? No/denies                         [facility] Adult PT Treatment/Exercise - 09/28/14 1012    Transfers   Sit to Stand 4: Min guard;With upper extremity assist;From chair/3-in-1  chair without armrests   Stand to Sit 5: Supervision;With upper extremity assist;To chair/3-in-1  chair without armrests   Ambulation/Gait   Ambulation/Gait Yes   Ambulation/Gait Assistance 4: Min assist;4: Min guard;5: Supervision  progressivelt decreased assistance with each rep   Ambulation/Gait Assistance Details manual assist to stablize initially each time   Ambulation Distance (Feet) 220 Feet  220' X 1, 250' X 1 with 2 crutches, 65' with 1  crutch   Assistive device Lofstrands;Prostheses   Gait Pattern Step-through pattern;Trunk flexed;Wide base of support   Ambulation Surface Indoor;Level;Outdoor;Unlevel;Paved;Gravel;Grass   Stairs Yes   Stairs Assistance 5: Supervision;4: Min guard   Stair Management Technique Alternating pattern;Forwards;One rail Right;One rail Left;With crutches   Number of Stairs 4  x 2 reps   Ramp 4: Min assist  crutches & prostheses   Curb 4: Min assist  crutches & prostheses   Knee/Hip Exercises: Machines for Strengthening   Cybex Leg Press --   Prosthetics   Prosthetic Care Comments  Pt educated to keep blisters covered with Tegaderm while wearing liners, open to air at night when not wearing liners and to dry frequently during day to prevent more blisters from forming.                       Current prosthetic wear tolerance (days/week)  daily   Current prosthetic wear tolerance (#hours/day)  all awake hours   Residual limb condition  left limb blister with 2 superficial 27m wounds covered with Tegaderm; right limb with incision medially with 192mopening with minimal pus drainage.   Education Provided Residual limb care;Proper wear schedule/adjustment;Prosthetic cleaning   Person(s) Educated Patient   Education Method Explanation   Education Method Verbalized understanding                  PT Short Term Goals - 09/14/14 1015    PT SHORT TERM GOAL #1   Title Patient verbalizes proper prosthetic care except cues on adjusting ply socks. (Target Date: 4th visit after Eval)   Baseline MET 09/14/14   Status Achieved   PT SHORT TERM GOAL #2   Title Patient tolerates wear >75% of awake hours without changes in skin integrity or pain / discomfort. (Target Date: 4th visit after Eval)   Baseline MET 09/14/14    Status Achieved   PT SHORT TERM GOAL #3   Title Sit to/from stand wc to rolling walker and reaches 10" with walker support modified independent. (Target Date: 4th visit after Eval)    Baseline Sit to/from stand wc to rolling walker with minimal assist and reaches 5" with walker support with minimal assist.    Status New   PT SHORT TERM GOAL #4   Title Patient ambulates 125' with rolling walker & prostheses with supervision. (Target Date: 4th visit after Eval)   Baseline MET 09/14/14   Status Achieved   PT SHORT TERM GOAL #5   Title Patient negotiates stairs with 2 rails & prostheses with minimal assist. (Target Date: 4th visit after Eval)   Baseline MET 09/14/14   Status Achieved           PT Long  Term Goals - 07/23/14 1100    PT LONG TERM GOAL #1   Title Patient is independent in prosthetic care. (Target Date: 8th visit after Eval)   Baseline Patient is dependent in all aspects of prosthetic care.   Time 3   Period Months   Status New   PT LONG TERM GOAL #2   Title Patient tolerates wear of prostheses >90% of awake hours without change in skin integrity or pain / discomfort. (Target Date: 8th visit after Eval)   Baseline Patient recieved prostheses 2 days ago & is progressing wear too rapidly placing him at risk of skin integrity issues &/or pain / discomfort.   Time 3   Period Months   Status New   PT LONG TERM GOAL #3   Title Patient is independent with standing balance with occasional touch to stabalize managing clothes (pants for toileting & jackets) and reaching 10", in upper /lower cabinetry and to floor modified independent. (Target Date: 8th visit after Eval)   Baseline Patient requires minimal assist for sit to/from stand and reaching 5" with rolling walker & prostheses.   Time 3   Status New   PT LONG TERM GOAL #4   Title Patient ambulates 250' with LRAD & prostheses modified independent. (Target Date: 8th visit after Eval)   Baseline Patient ambulates 68' with rolling walker & prostheses with minimal assist.   Time 3   Period Months   Status New   PT LONG TERM GOAL #5   Title Patient negotiates ramp, curb & stairs with LRAD & prostheses modified  independent. (Target Date: 8th visit after Eval)   Baseline Patient is unknowledgeable / dependent & unable to ambulate /negotiate barriers with prostheses.   Time 3   Period Months   Status New               Plan - 09/28/14 1015    Clinical Impression Statement patient appears on target to meet LTGs. He improved gait on grass with instruction.   Pt will benefit from skilled therapeutic intervention in order to improve on the following deficits Abnormal gait;Decreased activity tolerance;Decreased balance;Decreased endurance;Decreased knowledge of use of DME;Decreased mobility;Other (comment)  prosthetic dependency   Rehab Potential Good   PT Frequency 1x / week   PT Duration 8 weeks  over 3 month period   PT Treatment/Interventions ADLs/Self Care Home Management;DME Instruction;Gait training;Stair training;Functional mobility training;Therapeutic activities;Therapeutic exercise;Balance training;Neuromuscular re-education;Patient/family education;Other (comment)  prosthetic training   PT Next Visit Plan assess blistersl, continue towards LTGs   Consulted and Agree with Plan of Care Patient        Problem List Patient Active Problem List   Diagnosis Date Noted  . Cough 08/12/2014  . Bilateral hand pain 05/29/2014  . Acne 03/30/2014  . S/P bilateral BKA (below knee amputation) 01/22/2014  . Cocaine abuse 12/05/2013  . Tobacco abuse 12/04/2013  . Peripheral vascular disease   . Atrial fibrillation 12/03/2013  . 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 Personal history of colonic adenomas 05/29/2013  . Diabetic retinopathy 12/25/2012  . HLD (hyperlipidemia) 12/08/2012  . Peripheral neuropathy 11/19/2011  . ETOH abuse 03/29/2011  . HTN (hypertension) 12/22/2010  . Diabetes mellitus type II, uncontrolled 07/18/2006  . VENOUS INSUFFICIENCY 07/18/2006    Jamey Reas PT, DPT 09/28/2014, 9:04 PM  [facility] [address] [place], [place], [address] Phone: [phone]   Fax:  [phone]

## 2014-10-01 ENCOUNTER — Ambulatory Visit: Payer: Medicaid Other | Admitting: Family Medicine

## 2014-10-12 ENCOUNTER — Encounter: Payer: Self-pay | Admitting: Physical Therapy

## 2014-10-12 ENCOUNTER — Ambulatory Visit: Payer: Medicaid Other | Attending: Orthopaedic Surgery | Admitting: Physical Therapy

## 2014-10-12 DIAGNOSIS — Z7409 Other reduced mobility: Secondary | ICD-10-CM | POA: Diagnosis not present

## 2014-10-12 DIAGNOSIS — Z89511 Acquired absence of right leg below knee: Secondary | ICD-10-CM | POA: Insufficient documentation

## 2014-10-12 DIAGNOSIS — R269 Unspecified abnormalities of gait and mobility: Secondary | ICD-10-CM | POA: Diagnosis not present

## 2014-10-12 DIAGNOSIS — R29818 Other symptoms and signs involving the nervous system: Secondary | ICD-10-CM | POA: Insufficient documentation

## 2014-10-12 DIAGNOSIS — Z89512 Acquired absence of left leg below knee: Secondary | ICD-10-CM | POA: Insufficient documentation

## 2014-10-12 NOTE — Therapy (Signed)
[facility] [address] [place], [place], [address] Phone: [phone]   Fax:  [phone]  Physical Therapy Treatment  Patient Details  Name: Steven [name] MRN: [id number] Date of Birth: March 03, 1961 Referring Provider:  Leeanne Rio, MD  Encounter Date: 10/12/2014      PT End of Session - 10/12/14 1328    Visit Number 8   Number of Visits 9   Authorization Type Medicaid    Authorization Time Period 08/06/14 - 11/25/14   Authorization - Visit Number 7   Authorization - Number of Visits 8   PT Start Time 1319   PT Stop Time 1400   PT Time Calculation (min) 41 min   Equipment Utilized During Treatment Gait belt   Activity Tolerance Patient tolerated treatment well   Behavior During Therapy [facility] for tasks assessed/performed      Past Medical History  Diagnosis Date  . Uncontrolled diabetes mellitus   . HTN (hypertension)   . History of DVT (deep vein thrombosis)   . Personal history of diabetic foot ulcer   . Peripheral vascular disease     a. Abnl ABIs 2014.  Marland Kitchen Peripheral neuropathy 11/19/2011  . Varicose vein     legs  . Osteomyelitis of foot, right, acute 08/30/2012  . Personal history of colonic adenomas 05/29/2013  . History of cocaine use   . PAF (paroxysmal atrial fibrillation)     a. Dx 11/2013 in setting of sepsis/foot infection.  . Tobacco abuse   . H/O ETOH abuse   . Dysrhythmia     afib last admission  . Seasonal allergies     Past Surgical History  Procedure Laterality Date  . Amputation  04/21/2011    Procedure: AMPUTATION RAY;  Surgeon: Newt Minion, MD;  Location: [facility];  Service: Orthopedics;  Laterality: Right;  Rt foot 2nd ray ampt  . I&d extremity  05/03/2011    Procedure: IRRIGATION AND DEBRIDEMENT EXTREMITY;  Surgeon: Newt Minion, MD;  Location: [facility];  Service: Orthopedics;  Laterality: Right;  Irrigation and Debridement Right Foot and Place Acell Xenograft  . Toe amputation   04/24/2012    great toe   right foot  . Amputation  04/24/2012    Procedure: AMPUTATION RAY;  Surgeon: Newt Minion, MD;  Location: [facility];  Service: Orthopedics;  Laterality: Right;  Right foot 1st ray amputation  . Amputation Left 04/23/2013    Procedure: AMPUTATION RAY;  Surgeon: Newt Minion, MD;  Location: [facility];  Service: Orthopedics;  Laterality: Left;  Left Foot 5th Ray Amputation  . Colonoscopy    . Amputation Left 11/21/2013    Procedure: AMPUTATION RAY;  Surgeon: Newt Minion, MD;  Location: [facility];  Service: Orthopedics;  Laterality: Left;  Left Foot 1st & 2nd Ray Amputation  . Amputation Right 12/04/2013    Procedure: AMPUTATION BELOW KNEE;  Surgeon: Marianna Payment, MD;  Location: [facility];  Service: Orthopedics;  Laterality: Right;  . Amputation Left 12/29/2013    Procedure: LEFT AMPUTATION BELOW KNEE;  Surgeon: Marianna Payment, MD;  Location: [facility];  Service: Orthopedics;  Laterality: Left;    There were no vitals filed for this visit.  Visit Diagnosis:  Abnormality of gait  Impaired functional mobility and activity tolerance      Subjective Assessment - 10/12/14 1322    Subjective No new complaints.   Currently in Pain? No/denies  [facility] Adult PT Treatment/Exercise - 10/12/14 1330    Transfers   Sit to Stand 5: Supervision;With upper extremity assist;From chair/3-in-1   Stand to Sit 5: Supervision;With upper extremity assist;To chair/3-in-1   Ambulation/Gait   Ambulation/Gait Yes   Ambulation/Gait Assistance 5: Supervision;6: Modified independent (Device/Increase time)   Ambulation/Gait Assistance Details occasional cues on posture with walker. cues on sequence and gait pattern with crutches in addition to posture   Ambulation Distance (Feet) 450 Feet  220 x 1 with axillary crutches   Assistive device Rolling walker;Prostheses;Crutches  axillary crutches   Gait Pattern Step-through pattern;Trunk flexed;Wide base of support   Ambulation Surface  Level;Indoor   Stairs Yes   Stairs Assistance 6: Modified independent (Device/Increase time)   Stair Management Technique Two rails;Alternating pattern;Step to pattern;Forwards   Number of Stairs 4   Ramp 6: Modified independent (Device)  with walker/prostheses   Curb 6: Modified independent (Device/increase time)  with walker/prostheses   Prosthetics   Prosthetic Care Comments  Pt educated to increase drying times to 2x a day vs the 1x he is currently doing.                                             Current prosthetic wear tolerance (days/week)  daily   Current prosthetic wear tolerance (#hours/day)  all awake hours   Residual limb condition  blisters on both limb's appear healed without issues   Education Provided Residual limb care;Proper wear schedule/adjustment  drying >1 time a day   Person(s) Educated Patient   Education Method Explanation;Demonstration   Education Method Verbalized understanding   Donning Prosthesis Modified independent (device/increased time)   Doffing Prosthesis Modified independent (device/increased time)           PT Short Term Goals - 09/14/14 1015    PT SHORT TERM GOAL #1   Title Patient verbalizes proper prosthetic care except cues on adjusting ply socks. (Target Date: 4th visit after Eval)   Baseline MET 09/14/14   Status Achieved   PT SHORT TERM GOAL #2   Title Patient tolerates wear >75% of awake hours without changes in skin integrity or pain / discomfort. (Target Date: 4th visit after Eval)   Baseline MET 09/14/14    Status Achieved   PT SHORT TERM GOAL #3   Title Sit to/from stand wc to rolling walker and reaches 10" with walker support modified independent. (Target Date: 4th visit after Eval)   Baseline Sit to/from stand wc to rolling walker with minimal assist and reaches 5" with walker support with minimal assist.    Status New   PT SHORT TERM GOAL #4   Title Patient ambulates 125' with rolling walker & prostheses with supervision.  (Target Date: 4th visit after Eval)   Baseline MET 09/14/14   Status Achieved   PT SHORT TERM GOAL #5   Title Patient negotiates stairs with 2 rails & prostheses with minimal assist. (Target Date: 4th visit after Eval)   Baseline MET 09/14/14   Status Achieved           PT Long Term Goals - 07/23/14 1100    PT LONG TERM GOAL #1   Title Patient is independent in prosthetic care. (Target Date: 8th visit after Eval)   Baseline Patient is dependent in all aspects of prosthetic care.   Time 3   Period Months   Status  New   PT LONG TERM GOAL #2   Title Patient tolerates wear of prostheses >90% of awake hours without change in skin integrity or pain / discomfort. (Target Date: 8th visit after Eval)   Baseline Patient recieved prostheses 2 days ago & is progressing wear too rapidly placing him at risk of skin integrity issues &/or pain / discomfort.   Time 3   Period Months   Status New   PT LONG TERM GOAL #3   Title Patient is independent with standing balance with occasional touch to stabalize managing clothes (pants for toileting & jackets) and reaching 10", in upper /lower cabinetry and to floor modified independent. (Target Date: 8th visit after Eval)   Baseline Patient requires minimal assist for sit to/from stand and reaching 5" with rolling walker & prostheses.   Time 3   Status New   PT LONG TERM GOAL #4   Title Patient ambulates 250' with LRAD & prostheses modified independent. (Target Date: 8th visit after Eval)   Baseline Patient ambulates 28' with rolling walker & prostheses with minimal assist.   Time 3   Period Months   Status New   PT LONG TERM GOAL #5   Title Patient negotiates ramp, curb & stairs with LRAD & prostheses modified independent. (Target Date: 8th visit after Eval)   Baseline Patient is unknowledgeable / dependent & unable to ambulate /negotiate barriers with prostheses.   Time 3   Period Months   Status New           Plan - 10/12/14 1329    Clinical  Impression Statement Pt has met mobility long term goals at walker level. Reports he has axillary crutches at home already and no plans to purchase loftstrand crutches, therefore trailed gait with axillary crutches today. Pt needing increased assistance vs with walker for longer distances. Safe with crutches for short distances.                                  Pt will benefit from skilled therapeutic intervention in order to improve on the following deficits Abnormal gait;Decreased activity tolerance;Decreased balance;Decreased endurance;Decreased knowledge of use of DME;Decreased mobility;Other (comment)  prosthetic dependency   Rehab Potential Good   PT Frequency 1x / week   PT Duration 8 weeks  over 3 month period   PT Treatment/Interventions ADLs/Self Care Home Management;DME Instruction;Gait training;Stair training;Functional mobility training;Therapeutic activities;Therapeutic exercise;Balance training;Neuromuscular re-education;Patient/family education;Other (comment)  prosthetic training   PT Next Visit Plan assess long term goals for discharge   Consulted and Agree with Plan of Care Patient      Problem List Patient Active Problem List   Diagnosis Date Noted  . Cough 08/12/2014  . Bilateral hand pain 05/29/2014  . Acne 03/30/2014  . S/P bilateral BKA (below knee amputation) 01/22/2014  . Cocaine abuse 12/05/2013  . Tobacco abuse 12/04/2013  . Peripheral vascular disease   . Atrial fibrillation 12/03/2013  . 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 Personal history of colonic adenomas 05/29/2013  . Diabetic retinopathy 12/25/2012  . HLD (hyperlipidemia) 12/08/2012  . Peripheral neuropathy 11/19/2011  . ETOH abuse 03/29/2011  . HTN (hypertension) 12/22/2010  . Diabetes mellitus type II, uncontrolled 07/18/2006  . VENOUS INSUFFICIENCY 07/18/2006    Willow Ora 10/12/2014, 3:24 PM  Willow Ora, PTA, [facility] [address] [place], [place]  [address] [phone] 10/12/2014, 3:24 PM

## 2014-10-19 ENCOUNTER — Ambulatory Visit: Payer: Medicaid Other | Admitting: Physical Therapy

## 2014-10-19 ENCOUNTER — Encounter: Payer: Self-pay | Admitting: Physical Therapy

## 2014-10-19 DIAGNOSIS — Z7409 Other reduced mobility: Secondary | ICD-10-CM

## 2014-10-19 DIAGNOSIS — Z89512 Acquired absence of left leg below knee: Secondary | ICD-10-CM

## 2014-10-19 DIAGNOSIS — Z89511 Acquired absence of right leg below knee: Secondary | ICD-10-CM

## 2014-10-19 DIAGNOSIS — R269 Unspecified abnormalities of gait and mobility: Secondary | ICD-10-CM | POA: Diagnosis not present

## 2014-10-19 DIAGNOSIS — R2689 Other abnormalities of gait and mobility: Secondary | ICD-10-CM

## 2014-10-19 NOTE — Therapy (Signed)
[facility] [address] [place], [place], [address] Phone: [phone]   Fax:  [phone]  Patient Details  Name: Steven [name] MRN: [id number] Date of Birth: [birth year] Referring Provider:  No ref. provider found  Encounter Date: 10/19/2014  PHYSICAL THERAPY DISCHARGE SUMMARY  Visits from Start of Care: 9  Current functional level related to goals / functional outcomes:     PT Long Term Goals - 10/19/14 1330    PT LONG TERM GOAL #1   Title Patient is independent in prosthetic care. (Target Date: 8th visit after Eval)   Baseline MET 10/19/2014    Time 3   Period Months   Status Achieved   PT LONG TERM GOAL #2   Title Patient tolerates wear of prostheses >90% of awake hours without change in skin integrity or pain / discomfort. (Target Date: 8th visit after Eval)   Baseline MET 10/19/2014   Time 3   Period Months   Status Achieved   PT LONG TERM GOAL #3   Title Patient is independent with standing balance with occasional touch to stabalize managing clothes (pants for toileting & jackets) and reaching 10", in upper /lower cabinetry and to floor modified independent. (Target Date: 8th visit after Eval)   Baseline MET 10/19/2014   Time 3   Status Achieved   PT LONG TERM GOAL #4   Title Patient ambulates 250' with LRAD & prostheses modified independent. (Target Date: 8th visit after Eval)   Baseline MET 10/19/2014 with RW   Time 3   Period Months   Status Achieved   PT LONG TERM GOAL #5   Title Patient negotiates ramp, curb & stairs with LRAD & prostheses modified independent. (Target Date: 8th visit after Eval)   Baseline MET with RW 10/19/2014   Time 3   Period Months   Status Achieved       Remaining deficits: Patient needs rolling walker to ambulate in community with prostheses.   Education / Equipment: Prosthetic care /use, activity level Plan: Patient agrees to discharge.  Patient goals were met. Patient is  being discharged due to meeting the stated rehab goals.  ?????       Samauri Kellenberger PT, DPT 10/19/2014, 8:30 PM  [facility] [address] [place], [place], [address] Phone: [phone]   Fax:  [phone]

## 2014-10-19 NOTE — Therapy (Signed)
[facility] [address] [place], [place], [address] Phone: [phone]   Fax:  [phone]  Physical Therapy Treatment  Patient Details  Name: Steven [name] MRN: [id number] Date of Birth: [birth year] Referring Provider:  Leeanne Rio, MD  Encounter Date: 10/19/2014      PT End of Session - 10/19/14 1330    Visit Number 9   Number of Visits 9   Authorization Type Medicaid    Authorization Time Period 08/06/14 - 11/25/14   Authorization - Visit Number 8   Authorization - Number of Visits 8   PT Start Time 1330   PT Stop Time 1410   PT Time Calculation (min) 40 min   Equipment Utilized During Treatment Gait belt   Activity Tolerance Patient tolerated treatment well   Behavior During Therapy [facility] for tasks assessed/performed      Past Medical History  Diagnosis Date  . Uncontrolled diabetes mellitus   . HTN (hypertension)   . History of DVT (deep vein thrombosis)   . Personal history of diabetic foot ulcer   . Peripheral vascular disease     a. Abnl ABIs 2014.  Marland Kitchen Peripheral neuropathy 11/19/2011  . Varicose vein     legs  . Osteomyelitis of foot, right, acute 08/30/2012  . Personal history of colonic adenomas 05/29/2013  . History of cocaine use   . PAF (paroxysmal atrial fibrillation)     a. Dx 11/2013 in setting of sepsis/foot infection.  . Tobacco abuse   . H/O ETOH abuse   . Dysrhythmia     afib last admission  . Seasonal allergies     Past Surgical History  Procedure Laterality Date  . Amputation  04/21/2011    Procedure: AMPUTATION RAY;  Surgeon: Newt Minion, MD;  Location: [facility];  Service: Orthopedics;  Laterality: Right;  Rt foot 2nd ray ampt  . I&d extremity  05/03/2011    Procedure: IRRIGATION AND DEBRIDEMENT EXTREMITY;  Surgeon: Newt Minion, MD;  Location: [facility];  Service: Orthopedics;  Laterality: Right;  Irrigation and Debridement Right Foot and Place Acell Xenograft  . Toe amputation   04/24/2012    great toe   right foot  . Amputation  04/24/2012    Procedure: AMPUTATION RAY;  Surgeon: Newt Minion, MD;  Location: [facility];  Service: Orthopedics;  Laterality: Right;  Right foot 1st ray amputation  . Amputation Left 04/23/2013    Procedure: AMPUTATION RAY;  Surgeon: Newt Minion, MD;  Location: [facility];  Service: Orthopedics;  Laterality: Left;  Left Foot 5th Ray Amputation  . Colonoscopy    . Amputation Left 11/21/2013    Procedure: AMPUTATION RAY;  Surgeon: Newt Minion, MD;  Location: [facility];  Service: Orthopedics;  Laterality: Left;  Left Foot 1st & 2nd Ray Amputation  . Amputation Right 12/04/2013    Procedure: AMPUTATION BELOW KNEE;  Surgeon: Marianna Payment, MD;  Location: [facility];  Service: Orthopedics;  Laterality: Right;  . Amputation Left 12/29/2013    Procedure: LEFT AMPUTATION BELOW KNEE;  Surgeon: Marianna Payment, MD;  Location: [facility];  Service: Orthopedics;  Laterality: Left;    There were no vitals filed for this visit.  Visit Diagnosis:  Abnormality of gait  Impaired functional mobility and activity tolerance  Status post bilateral below knee amputation  Balance problems      Subjective Assessment - 10/19/14 1329    Subjective wearing prostheses all awake hours but removes  for 1 hr nap mid-day. No issues.   Currently in Pain? No/denies      Prosthetic Training: Sit to / from stand with  Modified Independent  From w/c and chairs with armrests. Patient ambulated with Modified Independent Device: Rolling walker & prostheses  Distance: 315' on Indoor  Level surface.  Patient ambulated modified independent with only prostheses 25' near counter with occassional touch to stabalize. Patient negotiated ramp with Modified Independent Device: Rolling walker & prostheses  Patient negotiated curb with Modified Independent Device: Rolling walker & prostheses  Patient negotiated stairs with Modified Independent Device: Two handrails & prostheses step-to  pattern  Balance with RW close for safety: reaches 10" anteriorly, to floor, donnes /doffes gown to simulate jacket, reaches to high shelf in upper cabinets and low shelf in lower shelf modified independent.                           PT Education - 10/19/14 1330    Education provided Yes   Education Details follow-up with prosthetist, activity level   Person(s) Educated Patient   Methods Explanation   Comprehension Verbalized understanding          PT Short Term Goals - 09/14/14 1015    PT SHORT TERM GOAL #1   Title Patient verbalizes proper prosthetic care except cues on adjusting ply socks. (Target Date: 4th visit after Eval)   Baseline MET 09/14/14   Status Achieved   PT SHORT TERM GOAL #2   Title Patient tolerates wear >75% of awake hours without changes in skin integrity or pain / discomfort. (Target Date: 4th visit after Eval)   Baseline MET 09/14/14    Status Achieved   PT SHORT TERM GOAL #3   Title Sit to/from stand wc to rolling walker and reaches 10" with walker support modified independent. (Target Date: 4th visit after Eval)   Baseline Sit to/from stand wc to rolling walker with minimal assist and reaches 5" with walker support with minimal assist.    Status New   PT SHORT TERM GOAL #4   Title Patient ambulates 125' with rolling walker & prostheses with supervision. (Target Date: 4th visit after Eval)   Baseline MET 09/14/14   Status Achieved   PT SHORT TERM GOAL #5   Title Patient negotiates stairs with 2 rails & prostheses with minimal assist. (Target Date: 4th visit after Eval)   Baseline MET 09/14/14   Status Achieved           PT Long Term Goals - 10/19/14 1330    PT LONG TERM GOAL #1   Title Patient is independent in prosthetic care. (Target Date: 8th visit after Eval)   Baseline MET 10/19/2014    Time 3   Period Months   Status Achieved   PT LONG TERM GOAL #2   Title Patient tolerates wear of prostheses >90% of awake hours without  change in skin integrity or pain / discomfort. (Target Date: 8th visit after Eval)   Baseline MET 10/19/2014   Time 3   Period Months   Status Achieved   PT LONG TERM GOAL #3   Title Patient is independent with standing balance with occasional touch to stabalize managing clothes (pants for toileting & jackets) and reaching 10", in upper /lower cabinetry and to floor modified independent. (Target Date: 8th visit after Eval)   Baseline MET 10/19/2014   Time 3   Status Achieved   PT LONG TERM GOAL #  4   Title Patient ambulates 250' with LRAD & prostheses modified independent. (Target Date: 8th visit after Eval)   Baseline MET 10/19/2014 with RW   Time 3   Period Months   Status Achieved   PT LONG TERM GOAL #5   Title Patient negotiates ramp, curb & stairs with LRAD & prostheses modified independent. (Target Date: 8th visit after Eval)   Baseline MET with RW 10/19/2014   Time 3   Period Months   Status Achieved               Plan - 10/19/14 1330    Clinical Impression Statement Patient met all LTGs. He is able to function at community level with prostheses & RW. He appears safe to ambulate in limited distance environments with walls / furniture nearby like a home with only prostheses.   Pt will benefit from skilled therapeutic intervention in order to improve on the following deficits Abnormal gait;Decreased activity tolerance;Decreased balance;Decreased endurance;Decreased knowledge of use of DME;Decreased mobility;Other (comment)  prosthetic dependency   Rehab Potential Good   PT Frequency 1x / week   PT Duration 8 weeks  over 3 month period   PT Treatment/Interventions ADLs/Self Care Home Management;DME Instruction;Gait training;Stair training;Functional mobility training;Therapeutic activities;Therapeutic exercise;Balance training;Neuromuscular re-education;Patient/family education;Other (comment)  prosthetic training   PT Next Visit Plan discharge   Consulted and Agree with Plan of  Care Patient        Problem List Patient Active Problem List   Diagnosis Date Noted  . Cough 08/12/2014  . Bilateral hand pain 05/29/2014  . Acne 03/30/2014  . S/P bilateral BKA (below knee amputation) 01/22/2014  . Cocaine abuse 12/05/2013  . Tobacco abuse 12/04/2013  . Peripheral vascular disease   . Atrial fibrillation 12/03/2013  . 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 Personal history of colonic adenomas 05/29/2013  . Diabetic retinopathy 12/25/2012  . HLD (hyperlipidemia) 12/08/2012  . Peripheral neuropathy 11/19/2011  . ETOH abuse 03/29/2011  . HTN (hypertension) 12/22/2010  . Diabetes mellitus type II, uncontrolled 07/18/2006  . VENOUS INSUFFICIENCY 07/18/2006    Jamey Reas PT, DPT 10/19/2014, 8:23 PM  [facility] [address] [place], [place], [address] Phone: [phone]   Fax:  [phone]

## 2014-10-21 ENCOUNTER — Encounter: Payer: Self-pay | Admitting: Internal Medicine

## 2014-10-29 ENCOUNTER — Ambulatory Visit (INDEPENDENT_AMBULATORY_CARE_PROVIDER_SITE_OTHER): Payer: Medicaid Other | Admitting: Family Medicine

## 2014-10-29 ENCOUNTER — Encounter: Payer: Self-pay | Admitting: Family Medicine

## 2014-10-29 VITALS — BP 135/82 | HR 70 | Temp 98.3°F | Ht 76.0 in

## 2014-10-29 DIAGNOSIS — M79641 Pain in right hand: Secondary | ICD-10-CM | POA: Diagnosis not present

## 2014-10-29 DIAGNOSIS — M79642 Pain in left hand: Secondary | ICD-10-CM

## 2014-10-29 DIAGNOSIS — E1165 Type 2 diabetes mellitus with hyperglycemia: Secondary | ICD-10-CM

## 2014-10-29 DIAGNOSIS — IMO0002 Reserved for concepts with insufficient information to code with codable children: Secondary | ICD-10-CM

## 2014-10-29 LAB — POCT GLYCOSYLATED HEMOGLOBIN (HGB A1C): Hemoglobin A1C: 11.4

## 2014-10-29 NOTE — Patient Instructions (Signed)
Schedule an appointment to see Dr. Valentina Lucks to discuss starting mealtime insulin some time in the next 2 weeks.  Get xrays of your hands done.  Follow up with me a few weeks after you see Dr. Valentina Lucks.  Be well, Dr. [name]

## 2014-11-02 ENCOUNTER — Ambulatory Visit: Payer: Medicaid Other | Admitting: Physical Therapy

## 2014-11-02 NOTE — Assessment & Plan Note (Signed)
No deformities. Suspect patient has osteoarthritis. I have asked him to go and get the x-rays that were previously ordered. I will follow-up with him when he returns.

## 2014-11-02 NOTE — Assessment & Plan Note (Signed)
A1c today is 11.4. Clearly diabetes is uncontrolled despite reported good fasting blood sugars. -I will have patient schedule an appointment in pharmacy clinic with Dr. Valentina Lucks to discuss starting mealtime insulin. I suspect he will need quite a bit of teaching around how to manage mealtime insulin. -Printed CBG chart and asked patient to fill it out, checking sugars 3 times daily and to bring it with him to his appointment in pharmacy clinic. -I will see him back several weeks after he sees Dr. Valentina Lucks.

## 2014-11-02 NOTE — Progress Notes (Signed)
Patient ID: Steven [name], male   DOB: 07/26/1960, 54 y.o.   MRN: [id number] Date of Visit: 10/29/2014    HPI:  F/u DM: did not bring CBG chart with him today. Last saw Dr. Katy Fitch (ophtho) in December. Thinks sugars have ranged 115-160 fasting. He is not able to recall his present dose of lantus from memory but when I tell him I instructed him to take 55 units he states this is what he was taking. Denies chest pain or shortness of breath.  Bilateral hand pain: Continues to complain of bilateral hand pain. Never got x-rays I ordered in January. No weakness in hands. No rashes.  ROS: See HPI.  [place]: Hypertension, peripheral vascular disease, A. fib, hyperlipidemia, diabetes, bilateral BKA's  PHYSICAL EXAM: BP 135/82 mmHg  Pulse 70  Temp(Src) 98.3 F (36.8 C) (Oral)  Ht 6\' 4"  (1.93 m)  Wt  Gen: NAD, pleasant, cooperative HEENT: Normocephalic, atraumatic Heart: Regular rate and rhythm, no murmur Lungs: Clear to auscultation bilaterally, normal respiratory effort Neuro: Grossly nonfocal, speech normal Ext: Status post bilateral BKA. Hands grossly normal. No edema, erythema, or deformities. Full grip strength bilaterally. 2+ radial pulses bilaterally  ASSESSMENT/PLAN:  Diabetes mellitus type II, uncontrolled A1c today is 11.4. Clearly diabetes is uncontrolled despite reported good fasting blood sugars. -I will have patient schedule an appointment in pharmacy clinic with Dr. Valentina Lucks to discuss starting mealtime insulin. I suspect he will need quite a bit of teaching around how to manage mealtime insulin. -Printed CBG chart and asked patient to fill it out, checking sugars 3 times daily and to bring it with him to his appointment in pharmacy clinic. -I will see him back several weeks after he sees Dr. Valentina Lucks.   Bilateral hand pain No deformities. Suspect patient has osteoarthritis. I have asked him to go and get the x-rays that were previously ordered. I will follow-up with him when he  returns.    FOLLOW UP: F/u in the next 1-2 weeks in pharmacy clinic for mealtime insulin instructions Follow-up with me several weeks after that.  [place]. [name], [place]

## 2014-11-04 NOTE — Progress Notes (Signed)
I was the preceptor for this visit. 

## 2014-11-12 ENCOUNTER — Encounter: Payer: Self-pay | Admitting: Pharmacist

## 2014-11-12 ENCOUNTER — Ambulatory Visit (INDEPENDENT_AMBULATORY_CARE_PROVIDER_SITE_OTHER): Payer: Medicaid Other | Admitting: Pharmacist

## 2014-11-12 VITALS — BP 124/79 | HR 84 | Ht 76.0 in | Wt 217.6 lb

## 2014-11-12 DIAGNOSIS — E1165 Type 2 diabetes mellitus with hyperglycemia: Secondary | ICD-10-CM | POA: Diagnosis present

## 2014-11-12 DIAGNOSIS — IMO0002 Reserved for concepts with insufficient information to code with codable children: Secondary | ICD-10-CM

## 2014-11-12 DIAGNOSIS — Z720 Tobacco use: Secondary | ICD-10-CM

## 2014-11-12 MED ORDER — INSULIN GLARGINE 100 UNIT/ML ~~LOC~~ SOLN: 45.0000 [IU] | Freq: Every day | SUBCUTANEOUS | Status: DC

## 2014-11-12 MED ORDER — NITROGLYCERIN 0.2 MG/HR TD PT24: 0.2000 mg | MEDICATED_PATCH | Freq: Every day | TRANSDERMAL | Status: DC

## 2014-11-12 MED ORDER — "INSULIN SYRINGE 29G X 1/2"" 1 ML MISC": 1.0000 | Freq: Once | Status: DC

## 2014-11-12 MED ORDER — INSULIN ASPART 100 UNIT/ML ~~LOC~~ SOLN: 10.0000 [IU] | Freq: Every day | SUBCUTANEOUS | Status: DC

## 2014-11-12 NOTE — Progress Notes (Signed)
S:    Patient arrives in motorized wheelchair and in no distress.    Presents for diabetes and mealtime insulin inititation.  Patient reports having history of Diabetes for many years and has never taken mealtime insulin. Wants to minimize number of insulin shots per day.   Patient reports adherence with medications. Current diabetes medications include Lantus 45 qpm.  Patient denies hypoglycemic events. O:  Lab Results  Component Value Date   HGBA1C 11.4 10/29/2014    Home fasting CBG: 85-260 with typical reading ~150  A/P: Longstanding history of diabetes. Currently poorly controlled with only basal insulin. Last Hgb A1c was 11.4, most likely high post prandial levels. Denies hypoglycemic events and is able to verbalize appropriate hypoglycemia management plan. Control is suboptimal due to no mealtime insulin coverage and sedentary lifestyle/immobility.   Change basal insulin Lantus (insulin glargine) to 45 units in the morning.  Initiated a trial of rapid insulin Novolog (insulin aspart) 10 units to take with his largest meal of the day. Written patient instructions provided.  Follow up in Pharmacist Clinic Visit timing TBD by next visit with Dr. [name].   Total time in face to face counseling 20 minutes.  Patient seen with Nilsa Nutting, PharmD Candidate and Elenor Quinones, PharmD Resident.   Nitroglycerin patch refilled.

## 2014-11-12 NOTE — Patient Instructions (Addendum)
Change lantus to 45 units each morning. Take 10 units of novolog before biggest meal of the day. Check blood sugars each morning and at night.  If night blood sugar <100, eat a small snack.  Follow up with Dr. [name].

## 2014-11-12 NOTE — Progress Notes (Signed)
Patient ID: Steven [name], male   DOB: 09/14/1960, 54 y.o.   MRN: [id number] Reviewed: Agree with Dr. Graylin Shiver documentation and management.

## 2014-11-12 NOTE — Assessment & Plan Note (Signed)
Longstanding history of diabetes. Currently poorly controlled with only basal insulin. Last Hgb A1c was 11.4, most likely high post prandial levels. Denies hypoglycemic events and is able to verbalize appropriate hypoglycemia management plan. Control is suboptimal due to no mealtime insulin coverage and sedentary lifestyle/immobility.   Change basal insulin Lantus (insulin glargine) to 45 units in the morning.  Initiated a trial of rapid insulin Novolog (insulin aspart) 10 units to take with his largest meal of the day.

## 2014-11-12 NOTE — Assessment & Plan Note (Signed)
Chronic tobacco abuse - uninterested in quit attempt at this time.   Offered support when patient is ready.

## 2014-11-13 MED ORDER — INSULIN ASPART 100 UNIT/ML ~~LOC~~ SOLN
10.0000 [IU] | Freq: Every day | SUBCUTANEOUS | Status: DC
Start: 2014-11-13 — End: 2015-06-28

## 2014-11-13 NOTE — Addendum Note (Signed)
Addended by: Leavy Cella on: 11/13/2014 09:18 AM   Modules accepted: Orders

## 2014-11-24 ENCOUNTER — Ambulatory Visit (HOSPITAL_COMMUNITY)
Admission: RE | Admit: 2014-11-24 | Discharge: 2014-11-24 | Disposition: A | Discharge status: Home / Self Care | Payer: Medicaid Other | Source: Ambulatory Visit | Attending: Family Medicine | Admitting: Family Medicine

## 2014-11-24 DIAGNOSIS — R200 Anesthesia of skin: Secondary | ICD-10-CM | POA: Insufficient documentation

## 2014-11-24 DIAGNOSIS — M79642 Pain in left hand: Secondary | ICD-10-CM | POA: Diagnosis not present

## 2014-11-24 DIAGNOSIS — M79641 Pain in right hand: Secondary | ICD-10-CM | POA: Diagnosis not present

## 2014-11-26 ENCOUNTER — Ambulatory Visit (INDEPENDENT_AMBULATORY_CARE_PROVIDER_SITE_OTHER): Payer: Medicaid Other | Admitting: Family Medicine

## 2014-11-26 ENCOUNTER — Encounter: Payer: Self-pay | Admitting: Family Medicine

## 2014-11-26 VITALS — BP 122/73 | HR 76 | Temp 98.2°F

## 2014-11-26 DIAGNOSIS — IMO0002 Reserved for concepts with insufficient information to code with codable children: Secondary | ICD-10-CM

## 2014-11-26 DIAGNOSIS — M79641 Pain in right hand: Secondary | ICD-10-CM | POA: Diagnosis present

## 2014-11-26 DIAGNOSIS — Z86010 Personal history of colonic polyps: Secondary | ICD-10-CM | POA: Diagnosis not present

## 2014-11-26 DIAGNOSIS — M79642 Pain in left hand: Secondary | ICD-10-CM

## 2014-11-26 DIAGNOSIS — E1165 Type 2 diabetes mellitus with hyperglycemia: Secondary | ICD-10-CM

## 2014-11-26 LAB — CBC
HCT: 45.9 % (ref 39.0–52.0)
HEMOGLOBIN: 15.3 g/dL (ref 13.0–17.0)
MCH: 29.3 pg (ref 26.0–34.0)
MCHC: 33.3 g/dL (ref 30.0–36.0)
MCV: 87.9 fL (ref 78.0–100.0)
MPV: 10.3 fL (ref 8.6–12.4)
Platelets: 204 10*3/uL (ref 150–400)
RBC: 5.22 MIL/uL (ref 4.22–5.81)
RDW: 14.6 % (ref 11.5–15.5)
WBC: 4.7 10*3/uL (ref 4.0–10.5)

## 2014-11-26 LAB — COMPREHENSIVE METABOLIC PANEL
ALBUMIN: 3.6 g/dL (ref 3.5–5.2)
ALT: 16 U/L (ref 0–53)
AST: 14 U/L (ref 0–37)
Alkaline Phosphatase: 69 U/L (ref 39–117)
BUN: 13 mg/dL (ref 6–23)
CHLORIDE: 104 meq/L (ref 96–112)
CO2: 22 mEq/L (ref 19–32)
Calcium: 9.4 mg/dL (ref 8.4–10.5)
Creat: 0.91 mg/dL (ref 0.50–1.35)
Glucose, Bld: 249 mg/dL — ABNORMAL HIGH (ref 70–99)
POTASSIUM: 3.9 meq/L (ref 3.5–5.3)
Sodium: 139 mEq/L (ref 135–145)
Total Bilirubin: 0.4 mg/dL (ref 0.2–1.2)
Total Protein: 6.3 g/dL (ref 6.0–8.3)

## 2014-11-26 LAB — C-REACTIVE PROTEIN: CRP: <0.5 mg/dL (ref <0.60)

## 2014-11-26 LAB — RHEUMATOID FACTOR

## 2014-11-26 NOTE — Patient Instructions (Addendum)
Please see the letter I printed from your GI doctor and call their office for an appointment.  Decrease lantus to 40 units in the morning Switch the 10 units of novolog to dinner time Continue checking sugars 3 times a day  Follow up with me in about 2-3 weeks I am referring you to an eye doctor for your eyes. You will get a phone call to schedule this appointment.  Checking labs for your hands.  Be well, Dr. [name]

## 2014-11-27 ENCOUNTER — Encounter: Payer: Self-pay | Admitting: Internal Medicine

## 2014-11-27 LAB — ANA: Anti Nuclear Antibody(ANA): NEGATIVE

## 2014-11-27 LAB — SEDIMENTATION RATE: Sed Rate: 1 mm/hr (ref 0–20)

## 2014-11-29 NOTE — Progress Notes (Addendum)
Patient ID: Steven [name], male   DOB: 13-Aug-1960, 54 y.o.   MRN: [id number]  HPI:  DM: currently taking lantus 45 units in the morning and 10 units of novolog with lunch. Morning sugars are running around 60's. Asymptomatic when sugars are this low. Lunch sugars run around 125, and dinner sugars in the 300's. Pt recalls these numbers from memory because although he brought his May sugar chart with him, left June chart at home. Last eye exam was over on eyear ago.  Aching hands: hands feel stiff and achy in the morning. R is worse than L. Recently had xrays which showed osteoarthritis.  Overdue colonoscopy: was due for colonoscopy repeat in December. It appears his GI doc sent several letters reminding him of this. Pt not aware of it.  ROS: See HPI.  [place]: hx poorly controlled T2DM, HTN, HLD, bilat BKA's  PHYSICAL EXAM: BP 122/73 mmHg  Pulse 76  Temp(Src) 98.2 F (36.8 C) (Oral)  Wt  Gen: NAD HEENT: NCAT Heart: RRR no murmur Lungs: CTAB NWOB Neuro: grossly nonfocal speech normal Ext: normal grip strength bilat. Hands atraumatic. S/p BKA bilat  ASSESSMENT/PLAN:  History of colonic polyps Printed letter sent to pt by GI doctor's office so he can call and schedule office visit for follow up since he's past due.  Diabetes mellitus type II, uncontrolled Morning fastings in 60's  - obviously lower than we want. Will decrease lantus to 40 units in AM. Move lunch novolog dose to dinner time. Gave new CBG chart. F/u 2-3 weeks  Cardiac: on statin, xarelto Renal: on ACE Eye: refer to ophtho Foot: s/p bilat BKA Immunizations: tetanus vaccine today   Bilateral hand pain Xrays without signs of erosive arthritis. Will check rheumatologic labs given reported stiffness in morning: sed rate, CMET, CBC, rheumatoid factor, ANA, CRP.   FOLLOW UP: F/u in 2-3 weeks for DM Call GI for appt Referring to ophtho for eye exam  [place] J. [name], [place]

## 2014-11-29 NOTE — Assessment & Plan Note (Signed)
Printed letter sent to pt by GI doctor's office so he can call and schedule office visit for follow up since he's past due.

## 2014-11-29 NOTE — Assessment & Plan Note (Signed)
Xrays without signs of erosive arthritis. Will check rheumatologic labs given reported stiffness in morning: sed rate, CMET, CBC, rheumatoid factor, ANA, CRP.

## 2014-11-29 NOTE — Assessment & Plan Note (Addendum)
Morning fastings in 60's  - obviously lower than we want. Will decrease lantus to 40 units in AM. Move lunch novolog dose to dinner time. Gave new CBG chart. F/u 2-3 weeks  Cardiac: on statin, xarelto Renal: on ACE Eye: refer to ophtho Foot: s/p bilat BKA Immunizations: tetanus vaccine today

## 2014-11-30 ENCOUNTER — Encounter: Payer: Self-pay | Admitting: Family Medicine

## 2014-12-17 ENCOUNTER — Telehealth: Payer: Self-pay | Admitting: *Deleted

## 2014-12-17 NOTE — Telephone Encounter (Signed)
Steven [name] had a colonoscopy 05/2013 with multiple polyps removed. Recall was for 3-6 months. Since then he has had many major medical changes, including at this time being on Xarelto. He will need an office visit prior to his colonoscopy. Do you prefer this to be with you or okay to schedule with an extender?

## 2014-12-21 ENCOUNTER — Encounter: Payer: Self-pay | Admitting: Internal Medicine

## 2014-12-21 NOTE — Telephone Encounter (Signed)
Yes - please set up non-urgent office visit

## 2014-12-21 NOTE — Telephone Encounter (Signed)
Steven [name] called patient and cancelled previsit and colonoscopy. Office visit scheduled with Dr. Carlean Purl.

## 2014-12-31 ENCOUNTER — Telehealth: Payer: Self-pay | Admitting: Family Medicine

## 2014-12-31 NOTE — Telephone Encounter (Signed)
Received personal care services request form from [facility] asking for ICD 10 codes. No accompanying release of information attached.   Red team, can you call patient and confirm that this is a request he initiated, and obtain verbal permission for [place] to send out his information to [facility].  Thanks, Leeanne Rio, MD

## 2015-01-01 NOTE — Telephone Encounter (Signed)
Kathi Simpers, Coordinator with [facility] called to verify if PCP received orders to sign for home care services for patient.  She was at the home with Mr. Carreras.  Mr. Pope gave verbal consent for the home care services.  Please fax form to the phone numbers provided.  If you have any questions please contact the [facility] home office [phone] in [place], [place] or [place] at [phone].  Derl Barrow, RN

## 2015-01-01 NOTE — Telephone Encounter (Signed)
Form completed. Will place in "to fax" box. Leeanne Rio, MD

## 2015-01-06 ENCOUNTER — Encounter: Payer: Medicaid Other | Admitting: Internal Medicine

## 2015-01-28 ENCOUNTER — Other Ambulatory Visit: Payer: Self-pay | Admitting: *Deleted

## 2015-01-29 MED ORDER — LISINOPRIL 20 MG PO TABS: 20.0000 mg | ORAL_TABLET | Freq: Every day | ORAL | Status: DC

## 2015-02-19 ENCOUNTER — Encounter: Payer: Self-pay | Admitting: Internal Medicine

## 2015-02-22 ENCOUNTER — Ambulatory Visit: Payer: Medicaid Other | Admitting: Internal Medicine

## 2015-03-30 ENCOUNTER — Emergency Department (HOSPITAL_COMMUNITY): Payer: Medicaid Other

## 2015-03-30 ENCOUNTER — Emergency Department (EMERGENCY_DEPARTMENT_HOSPITAL)
Admit: 2015-03-30 | Discharge: 2015-03-30 | Disposition: A | Discharge status: Home / Self Care | Payer: Medicaid Other | Attending: Family Medicine | Admitting: Family Medicine

## 2015-03-30 ENCOUNTER — Emergency Department (HOSPITAL_COMMUNITY)
Admission: EM | Admit: 2015-03-30 | Discharge: 2015-03-30 | Disposition: A | Discharge status: Home / Self Care | Payer: Medicaid Other | Attending: Emergency Medicine | Admitting: Emergency Medicine

## 2015-03-30 ENCOUNTER — Encounter (HOSPITAL_COMMUNITY): Payer: Self-pay

## 2015-03-30 DIAGNOSIS — M79609 Pain in unspecified limb: Secondary | ICD-10-CM

## 2015-03-30 DIAGNOSIS — Z86718 Personal history of other venous thrombosis and embolism: Secondary | ICD-10-CM | POA: Diagnosis not present

## 2015-03-30 DIAGNOSIS — L03115 Cellulitis of right lower limb: Secondary | ICD-10-CM | POA: Diagnosis not present

## 2015-03-30 DIAGNOSIS — M79661 Pain in right lower leg: Secondary | ICD-10-CM | POA: Diagnosis present

## 2015-03-30 DIAGNOSIS — Z720 Tobacco use: Secondary | ICD-10-CM | POA: Insufficient documentation

## 2015-03-30 DIAGNOSIS — I10 Essential (primary) hypertension: Secondary | ICD-10-CM | POA: Diagnosis not present

## 2015-03-30 DIAGNOSIS — R52 Pain, unspecified: Secondary | ICD-10-CM

## 2015-03-30 DIAGNOSIS — M5410 Radiculopathy, site unspecified: Secondary | ICD-10-CM | POA: Diagnosis not present

## 2015-03-30 DIAGNOSIS — R2241 Localized swelling, mass and lump, right lower limb: Secondary | ICD-10-CM | POA: Diagnosis not present

## 2015-03-30 DIAGNOSIS — Z792 Long term (current) use of antibiotics: Secondary | ICD-10-CM | POA: Insufficient documentation

## 2015-03-30 DIAGNOSIS — Z794 Long term (current) use of insulin: Secondary | ICD-10-CM | POA: Diagnosis not present

## 2015-03-30 DIAGNOSIS — Z79899 Other long term (current) drug therapy: Secondary | ICD-10-CM | POA: Insufficient documentation

## 2015-03-30 DIAGNOSIS — E119 Type 2 diabetes mellitus without complications: Secondary | ICD-10-CM | POA: Diagnosis not present

## 2015-03-30 LAB — CBC WITH DIFFERENTIAL/PLATELET
Basophils Absolute: 0 10*3/uL (ref 0.0–0.1)
Basophils Relative: 0 %
Eosinophils Absolute: 0 10*3/uL (ref 0.0–0.7)
Eosinophils Relative: 1 %
HEMATOCRIT: 50.5 % (ref 39.0–52.0)
HEMOGLOBIN: 17.5 g/dL — AB (ref 13.0–17.0)
LYMPHS ABS: 1.9 10*3/uL (ref 0.7–4.0)
LYMPHS PCT: 29 %
MCH: 29.5 pg (ref 26.0–34.0)
MCHC: 34.7 g/dL (ref 30.0–36.0)
MCV: 85.2 fL (ref 78.0–100.0)
MONOS PCT: 10 %
Monocytes Absolute: 0.6 10*3/uL (ref 0.1–1.0)
NEUTROS ABS: 3.8 10*3/uL (ref 1.7–7.7)
NEUTROS PCT: 60 %
Platelets: 185 10*3/uL (ref 150–400)
RBC: 5.93 MIL/uL — AB (ref 4.22–5.81)
RDW: 14.6 % (ref 11.5–15.5)
WBC: 6.3 10*3/uL (ref 4.0–10.5)

## 2015-03-30 LAB — BASIC METABOLIC PANEL
Anion gap: 16 — ABNORMAL HIGH (ref 5–15)
BUN: 9 mg/dL (ref 6–20)
CHLORIDE: 96 mmol/L — AB (ref 101–111)
CO2: 21 mmol/L — AB (ref 22–32)
CREATININE: 0.98 mg/dL (ref 0.61–1.24)
Calcium: 9.9 mg/dL (ref 8.9–10.3)
GFR calc Af Amer: >60 mL/min (ref >60)
GFR calc non Af Amer: >60 mL/min (ref >60)
Glucose, Bld: 170 mg/dL — ABNORMAL HIGH (ref 65–99)
POTASSIUM: 4.9 mmol/L (ref 3.5–5.1)
Sodium: 133 mmol/L — ABNORMAL LOW (ref 135–145)

## 2015-03-30 LAB — SEDIMENTATION RATE: Sed Rate: 13 mm/hr (ref 0–16)

## 2015-03-30 LAB — C-REACTIVE PROTEIN: CRP: 2.2 mg/dL — ABNORMAL HIGH (ref <1.0)

## 2015-03-30 LAB — CBG MONITORING, ED
Glucose-Capillary: 190 mg/dL — ABNORMAL HIGH (ref 65–99)
Glucose-Capillary: 204 mg/dL — ABNORMAL HIGH (ref 65–99)

## 2015-03-30 MED ORDER — GADOBENATE DIMEGLUMINE 529 MG/ML IV SOLN
19.0000 mL | Freq: Once | INTRAVENOUS | Status: AC | PRN
  Administered 2015-03-30: 19 mL via INTRAVENOUS

## 2015-03-30 MED ORDER — HYDROMORPHONE HCL 1 MG/ML IJ SOLN
1.0000 mg | Freq: Once | INTRAMUSCULAR | Status: AC
  Administered 2015-03-30: 1 mg via INTRAVENOUS
  Filled 2015-03-30: qty 1

## 2015-03-30 MED ORDER — HYDROMORPHONE HCL 1 MG/ML IJ SOLN
1.0000 mg | Freq: Once | INTRAMUSCULAR | Status: AC
Start: 2015-03-30 — End: 2015-03-30
  Administered 2015-03-30: 1 mg via INTRAVENOUS
  Filled 2015-03-30: qty 1

## 2015-03-30 MED ORDER — VANCOMYCIN HCL IN DEXTROSE 1-5 GM/200ML-% IV SOLN
1000.0000 mg | Freq: Once | INTRAVENOUS | Status: AC
  Administered 2015-03-30: 1000 mg via INTRAVENOUS
  Filled 2015-03-30: qty 200

## 2015-03-30 MED ORDER — HEPARIN (PORCINE) IN NACL 100-0.45 UNIT/ML-% IJ SOLN
1400.0000 [IU]/h | INTRAMUSCULAR | Status: DC
  Filled 2015-03-30: qty 250

## 2015-03-30 MED ORDER — MORPHINE SULFATE (PF) 4 MG/ML IV SOLN
4.0000 mg | Freq: Once | INTRAVENOUS | Status: AC
  Administered 2015-03-30: 4 mg via INTRAVENOUS
  Filled 2015-03-30: qty 1

## 2015-03-30 MED ORDER — ONDANSETRON HCL 4 MG/2ML IJ SOLN
4.0000 mg | Freq: Once | INTRAMUSCULAR | Status: AC
  Administered 2015-03-30: 4 mg via INTRAVENOUS
  Filled 2015-03-30: qty 2

## 2015-03-30 MED ORDER — SODIUM CHLORIDE 0.9 % IV BOLUS (SEPSIS)
1000.0000 mL | Freq: Once | INTRAVENOUS | Status: AC
  Administered 2015-03-30: 1000 mL via INTRAVENOUS

## 2015-03-30 MED ORDER — HEPARIN BOLUS VIA INFUSION
5000.0000 [IU] | Freq: Once | INTRAVENOUS | Status: DC
  Filled 2015-03-30: qty 5000

## 2015-03-30 MED ORDER — OXYCODONE-ACETAMINOPHEN 5-325 MG PO TABS: 1.0000 | ORAL_TABLET | ORAL | Status: DC | PRN

## 2015-03-30 MED ORDER — CEPHALEXIN 500 MG PO CAPS: 500.0000 mg | ORAL_CAPSULE | Freq: Three times a day (TID) | ORAL | Status: DC

## 2015-03-30 NOTE — ED Notes (Signed)
Pt requesting pain meds

## 2015-03-30 NOTE — ED Provider Notes (Signed)
CSN: [id number]     Arrival date & time 03/30/15  1241 History  By signing my name below, I, Steven [name], attest that this documentation has been prepared under the direction and in the presence of non-physician practitioner, Margarita Mail, PA-C. Electronically Signed: Evelene [name], Scribe. 03/30/2015.  3:47 PM.   Chief Complaint  Patient presents with  . Leg Pain   The history is provided by the patient. No language interpreter was used.    HPI Comments:  Steven [name] is a 54 y.o. male with a history of DM, PVD, DVT, osteomyelitis, and bilateral BKA (2012),who presents to the Emergency Department complaining of throbbing, 10/10, pain to his right stump which has been constant since onset last night. He reports associated swelling to the site. Pt was asymptomatic prior to symptom onset.  No alleviating factors noted. He denies fever and chills.    Past Medical History  Diagnosis Date  . Uncontrolled diabetes mellitus ([place])   . HTN (hypertension)   . History of DVT (deep vein thrombosis)   . Personal history of diabetic foot ulcer   . Peripheral vascular disease ([place])     a. Abnl ABIs 2014.  Marland Kitchen Peripheral neuropathy ([place]) 11/19/2011  . Varicose vein     legs  . Osteomyelitis of foot, right, acute ([place]) 08/30/2012  . Personal history of colonic adenomas 05/29/2013  . History of cocaine use   . PAF (paroxysmal atrial fibrillation) ([place])     a. Dx 11/2013 in setting of sepsis/foot infection.  . Tobacco abuse   . H/O ETOH abuse   . Dysrhythmia     afib last admission  . Seasonal allergies    Past Surgical History  Procedure Laterality Date  . Amputation  04/21/2011    Procedure: AMPUTATION RAY;  Surgeon: Newt Minion, MD;  Location: [facility];  Service: Orthopedics;  Laterality: Right;  Rt foot 2nd ray ampt  . I&d extremity  05/03/2011    Procedure: IRRIGATION AND DEBRIDEMENT EXTREMITY;  Surgeon: Newt Minion, MD;  Location: [facility];  Service: Orthopedics;  Laterality: Right;   Irrigation and Debridement Right Foot and Place Acell Xenograft  . Toe amputation  04/24/2012    great toe   right foot  . Amputation  04/24/2012    Procedure: AMPUTATION RAY;  Surgeon: Newt Minion, MD;  Location: [facility];  Service: Orthopedics;  Laterality: Right;  Right foot 1st ray amputation  . Amputation Left 04/23/2013    Procedure: AMPUTATION RAY;  Surgeon: Newt Minion, MD;  Location: [facility];  Service: Orthopedics;  Laterality: Left;  Left Foot 5th Ray Amputation  . Colonoscopy    . Amputation Left 11/21/2013    Procedure: AMPUTATION RAY;  Surgeon: Newt Minion, MD;  Location: [facility];  Service: Orthopedics;  Laterality: Left;  Left Foot 1st & 2nd Ray Amputation  . Amputation Right 12/04/2013    Procedure: AMPUTATION BELOW KNEE;  Surgeon: Marianna Payment, MD;  Location: [facility];  Service: Orthopedics;  Laterality: Right;  . Amputation Left 12/29/2013    Procedure: LEFT AMPUTATION BELOW KNEE;  Surgeon: Marianna Payment, MD;  Location: [facility];  Service: Orthopedics;  Laterality: Left;   Family History  Problem Relation Age of Onset  . Diabetes Mother   . Stroke Brother   . Colon cancer Neg Hx    Social History  Substance Use Topics  . Smoking status: Current Every Day Smoker -- 0.50 packs/day for 30 years  Types: Cigarettes    Start date: 06/13/1983  . Smokeless tobacco: Never Used  . Alcohol Use: No     Comment: t states he hs rank any alcholol rcently.  drinks off and on    Review of Systems  Constitutional: Negative for fever and chills.  Respiratory: Negative for shortness of breath.   Cardiovascular: Positive for leg swelling.  Musculoskeletal: Positive for myalgias (RLE).  All other systems reviewed and are negative.  Allergies  Review of patient's allergies indicates no known allergies.  Home Medications   Prior to Admission medications   Medication Sig Start Date End Date Taking? Authorizing Provider  acetaminophen (TYLENOL) 325 MG tablet Take 2 tablets (650  mg total) by mouth every 6 (six) hours as needed. Patient not taking: Reported on 11/12/2014 06/22/14   Leeanne Rio, MD  albuterol (PROVENTIL HFA;VENTOLIN HFA) 108 (90 BASE) MCG/ACT inhaler Inhale 2 puffs into the lungs every 6 (six) hours as needed for wheezing or shortness of breath. Patient not taking: Reported on 11/12/2014 06/23/14   Leeanne Rio, MD  atorvastatin (LIPITOR) 40 MG tablet Take 1 tablet (40 mg total) by mouth daily. 03/31/14   Leeanne Rio, MD  cetirizine (ZYRTEC) 10 MG tablet Take 1 tablet (10 mg total) by mouth daily. 08/12/14   Leeanne Rio, MD  diltiazem (CARDIZEM CD) 120 MG 24 hr capsule Take 120 mg by mouth daily.    Historical Provider, MD  doxycycline (VIBRAMYCIN) 100 MG capsule Take 100 mg by mouth 2 (two) times daily.    Historical Provider, MD  fluticasone (FLONASE) 50 MCG/ACT nasal spray Place 2 sprays into both nostrils daily. 09/09/14   Leeanne Rio, MD  folic acid (FOLVITE) 1 MG tablet Take 1 tablet (1 mg total) by mouth daily. 12/18/13   Bary Leriche, PA-C  gabapentin (NEURONTIN) 300 MG capsule Take 300mg  every morning, 300mg  every day at noon, and 600mg  every night at bedtime. 05/20/14   Leeanne Rio, MD  insulin aspart (NOVOLOG) 100 UNIT/ML injection Inject 10-15 Units into the skin daily before supper. 11/13/14   Zenia Resides, MD  insulin glargine (LANTUS) 100 UNIT/ML injection Inject 0.45 mLs (45 Units total) into the skin daily before breakfast. 11/12/14   Zenia Resides, MD  INSULIN SYRINGE 1CC/29G (B-D INSULIN SYRINGE) 29G X 1/2" 1 ML MISC 1 Container by Does not apply route once. 11/12/14   Zenia Resides, MD  lisinopril (PRINIVIL,ZESTRIL) 20 MG tablet Take 1 tablet (20 mg total) by mouth daily. For blood pressure 01/29/15   Alveda Reasons, MD  nitroGLYCERIN (NITRODUR - DOSED IN MG/24 HR) 0.2 mg/hr patch Place 1 patch (0.2 mg total) onto the skin daily. 11/12/14   Zenia Resides, MD  pantoprazole (PROTONIX) 40 MG tablet  Take 1 tablet (40 mg total) by mouth at bedtime. 09/25/14   Leeanne Rio, MD  rivaroxaban (XARELTO) 20 MG TABS tablet Take 1 tablet (20 mg total) by mouth daily with supper. For Atrial Fibrillation 06/22/14   Leeanne Rio, MD  senna-docusate (SENOKOT-S) 8.6-50 MG per tablet Take 2 tablets by mouth 2 (two) times daily. For constipation Patient not taking: Reported on 11/12/2014 12/18/13   Ivan Anchors Love, PA-C  thiamine 100 MG tablet Take 1 tablet (100 mg total) by mouth daily. Patient not taking: Reported on 11/12/2014 12/18/13   Ivan Anchors Love, PA-C  traMADol (ULTRAM) 50 MG tablet Take 1 tablet (50 mg total) by mouth every 8 (eight) hours  as needed (pain). Patient not taking: Reported on 11/12/2014 01/22/14   Leeanne Rio, MD   BP 186/103 mmHg  Pulse 104  Temp(Src) 98.1 F (36.7 C) (Oral)  Resp 18  SpO2 94% Physical Exam  Constitutional: He is oriented to person, place, and time. He appears well-developed and well-nourished. No distress.  HENT:  Head: Normocephalic and atraumatic.  Eyes: Conjunctivae are normal.  Cardiovascular: Normal rate.   Pulmonary/Chest: Effort normal.  Abdominal: He exhibits no distension.  Musculoskeletal: He exhibits edema and tenderness.  Bilateral BKAs Edema in the right stump  There is a small area of crusting on the right medial border of the suture line that is tender to palpation  No warmth but palpable edema No bony tenderness    Neurological: He is alert and oriented to person, place, and time.  Skin: Skin is warm and dry.  Psychiatric: He has a normal mood and affect.  Nursing note and vitals reviewed.   ED Course  Procedures   DIAGNOSTIC STUDIES:  Oxygen Saturation is 94% on RA, normal by my interpretation.    COORDINATION OF CARE:  2:08 PM Will order DVT study, MRI and pain meds. Discussed treatment plan with pt at bedside and pt agreed to plan.  3:19 PM Pt updated with results, notes he has not been compliant with  Xarelto.  3:20 PM Discussed case with Dr. Trula Slade (vascular surgery)   Labs Review Labs Reviewed  CBC WITH DIFFERENTIAL/PLATELET - Abnormal; Notable for the following:    RBC 5.93 (*)    Hemoglobin 17.5 (*)    All other components within normal limits  CBG MONITORING, ED - Abnormal; Notable for the following:    Glucose-Capillary 190 (*)    All other components within normal limits  CBG MONITORING, ED - Abnormal; Notable for the following:    Glucose-Capillary 204 (*)    All other components within normal limits  BASIC METABOLIC PANEL  SEDIMENTATION RATE    Imaging Review Dg Tibia/fibula Right  03/30/2015  CLINICAL DATA:  Pain along stump.  Below the knee amputation. EXAM: RIGHT TIBIA AND FIBULA - 2 VIEW COMPARISON:  None. FINDINGS: Below the knee amputation noted. Dystrophic calcifications noted in the vicinity of the amputation site. Soft tissue thickness overlying the bony margin a minimum of 8 mm. There is some heterotopic ossification extending between the distal tibia and distal fibula margins. I do not see definite gas within the soft tissues. No destructive bony findings characteristic of osteomyelitis. Considerable tricompartmental spurring in the knee. IMPRESSION: 1. No definite gas tracking in the soft tissues along the stump. Dystrophic calcifications are observed and there is a minimum of 8 mm of soft tissue thickness overlying the bony margin. 2. Tricompartmental osteoarthritis. 3. No compelling radiographic findings of osteomyelitis. If pain persists despite conservative therapy, MRI may be warranted for further characterization. Electronically Signed   By: Van Clines M.D.   On: 03/30/2015 13:25   I have personally reviewed and evaluated these images and lab results as part of my medical decision-making.   EKG Interpretation None      MDM  3:44 PM BP 186/103 mmHg  Pulse 104  Temp(Src) 98.1 F (36.7 C) (Oral)  Resp 18  Wt 200 lb (90.719 kg)  SpO2  94% Consulted with Dr. Lytle Butte (vascular surgery) in the ED. He does not feel pt's symptom is due to an acute vascular issue. Will discontinue heparin and continue with MRI as planned.   Final diagnoses:  Pain  Radicular leg pain  Cellulitis of right lower extremity    BP 138/93 mmHg  Pulse 91  Temp(Src) 98.1 F (36.7 C) (Oral)  Resp 27  Wt 200 lb (90.719 kg)  SpO2 94% Patient MRI is negative. Possible early cellulits. Treated in the ed with vanc. D.c. With keflex and pain meds. Follow closely with  pcp.  Ulla Potash, personally performed the services described in this documentation. All medical record entries made by the scribe were at my direction and in my presence.  I have reviewed the chart and discharge instructions and agree that the record reflects my personal performance and is accurate and complete. Margarita Mail.  03/30/2015. 11:23 PM.       Margarita Mail, PA-C 03/30/15 [place], DO 03/31/15 [phone]

## 2015-03-30 NOTE — ED Notes (Signed)
Vascular surgeon in to assess pt at this time

## 2015-03-30 NOTE — ED Notes (Signed)
Pt is bilateral BKA. C/O pain in right stump since last pm. No known injury. No redness or swelling noted.

## 2015-03-30 NOTE — Consult Note (Signed)
[facility] NOTE   MRN : [id number]  Reason for Consult: Right BKA stump pain Referring Physician: ED  History of Present Illness: 54 y/o male with 24 hour history of right stump pain.  He states he has had no injury, no fever or chills.  He had his right BKA performed by Dr. 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   Past medical history hypertension managed with Cardizem and Lisinipril, hyperlipidemia managed with Lipitor, and   He is on Xarelto.  He does have a history of DVT.   No current facility-administered medications for this encounter.   Current Outpatient Prescriptions  Medication Sig Dispense Refill  . diltiazem (CARDIZEM CD) 120 MG 24 hr capsule Take 120 mg by mouth daily.    Marland Kitchen doxycycline (VIBRAMYCIN) 100 MG capsule Take 100 mg by mouth 2 (two) times daily.    . fluticasone (FLONASE) 50 MCG/ACT nasal spray Place 2 sprays into both nostrils daily. 16 g 1  . folic acid (FOLVITE) 1 MG tablet Take 1 tablet (1 mg total) by mouth daily. 30 tablet 1  . gabapentin (NEURONTIN) 300 MG capsule Take 300mg  every morning, 300mg  every day at noon, and 600mg  every night at bedtime. 120 capsule 5  . insulin aspart (NOVOLOG) 100 UNIT/ML injection Inject 10-15 Units into the skin daily before supper. 1 vial PRN  . insulin glargine (LANTUS) 100 UNIT/ML injection Inject 0.45 mLs (45 Units total) into the skin daily before breakfast. 10 mL 6  . lisinopril (PRINIVIL,ZESTRIL) 20 MG tablet Take 1 tablet (20 mg total) by mouth daily. For blood pressure 90 tablet 0  . nitroGLYCERIN (NITRODUR - DOSED IN MG/24 HR) 0.2 mg/hr patch Place 1 patch (0.2 mg total) onto the skin daily. 30 patch 5  . pantoprazole (PROTONIX) 40 MG tablet Take 1 tablet (40 mg total) by mouth at bedtime. 90 tablet 1  . rivaroxaban (XARELTO) 20 MG TABS tablet Take 1 tablet (20 mg total) by mouth daily with supper. For Atrial Fibrillation 90 tablet 1  . acetaminophen (TYLENOL)  325 MG tablet Take 2 tablets (650 mg total) by mouth every 6 (six) hours as needed. (Patient not taking: Reported on 11/12/2014) 60 tablet 1  . atorvastatin (LIPITOR) 40 MG tablet Take 1 tablet (40 mg total) by mouth daily. 30 tablet 11  . cetirizine (ZYRTEC) 10 MG tablet Take 1 tablet (10 mg total) by mouth daily. 30 tablet 5    Pt meds include: Statin :Yes Betablocker: No ASA: No Other anticoagulants/antiplatelets: Xarelto  Past Medical History  Diagnosis Date  . Uncontrolled diabetes mellitus ([place])   . HTN (hypertension)   . History of DVT (deep vein thrombosis)   . Personal history of diabetic foot ulcer   . Peripheral vascular disease ([place])     a. Abnl ABIs 2014.  Marland Kitchen Peripheral neuropathy ([place]) 11/19/2011  . Varicose vein     legs  . Osteomyelitis of foot, right, acute ([place]) 08/30/2012  . Personal history of colonic adenomas 05/29/2013  . History of cocaine use   . PAF (paroxysmal atrial fibrillation) ([place])     a. Dx 11/2013 in setting of sepsis/foot infection.  . Tobacco abuse   . H/O ETOH abuse   . Dysrhythmia     afib last admission  . Seasonal allergies     Past Surgical History  Procedure Laterality Date  . Amputation  04/21/2011    Procedure: AMPUTATION RAY;  Surgeon: Newt Minion, MD;  Location: [facility];  Service: Orthopedics;  Laterality: Right;  Rt foot 2nd ray ampt  . I&d extremity  05/03/2011    Procedure: IRRIGATION AND DEBRIDEMENT EXTREMITY;  Surgeon: Newt Minion, MD;  Location: [facility];  Service: Orthopedics;  Laterality: Right;  Irrigation and Debridement Right Foot and Place Acell Xenograft  . Toe amputation  04/24/2012    great toe   right foot  . Amputation  04/24/2012    Procedure: AMPUTATION RAY;  Surgeon: Newt Minion, MD;  Location: [facility];  Service: Orthopedics;  Laterality: Right;  Right foot 1st ray amputation  . Amputation Left 04/23/2013    Procedure: AMPUTATION RAY;  Surgeon: Newt Minion, MD;  Location: [facility];  Service: Orthopedics;  Laterality:  Left;  Left Foot 5th Ray Amputation  . Colonoscopy    . Amputation Left 11/21/2013    Procedure: AMPUTATION RAY;  Surgeon: Newt Minion, MD;  Location: [facility];  Service: Orthopedics;  Laterality: Left;  Left Foot 1st & 2nd Ray Amputation  . Amputation Right 12/04/2013    Procedure: AMPUTATION BELOW KNEE;  Surgeon: Marianna Payment, MD;  Location: [place];  Service: Orthopedics;  Laterality: Right;  . Amputation Left 12/29/2013    Procedure: LEFT AMPUTATION BELOW KNEE;  Surgeon: Marianna Payment, MD;  Location: [facility];  Service: Orthopedics;  Laterality: Left;    Social History Social History  Substance Use Topics  . Smoking status: Current Every Day Smoker -- 0.50 packs/day for 30 years    Types: Cigarettes    Start date: 06/13/1983  . Smokeless tobacco: Never Used  . Alcohol Use: No     Comment: t states he hs rank any alcholol rcently.  drinks off and on    Family History Family History  Problem Relation Age of Onset  . Diabetes Mother   . Stroke Brother   . Colon cancer Neg Hx     No Known Allergies   REVIEW OF SYSTEMS  General: [ ]  Weight loss, [ ]  Fever, [ ]  chills Neurologic: [ ]  Dizziness, [ ]  Blackouts, [ ]  Seizure [ ]  Stroke, [ ]  "Mini stroke", [ ]  Slurred speech, [ ]  Temporary blindness; [ ]  weakness in arms or legs, [ ]  Hoarseness [ ]  Dysphagia Cardiac: [ ]  Chest pain/pressure, [ ]  Shortness of breath at rest [ ]  Shortness of breath with exertion, [ ]  Atrial fibrillation or irregular heartbeat  Vascular: [ ]  Pain in legs with walking, [ ]  Pain in legs at rest, [ ]  Pain in legs at night,  [ ]  Non-healing ulcer, [ ]  Blood clot in vein/DVT,   Pulmonary: [ ]  Home oxygen, [ ]  Productive cough, [ ]  Coughing up blood, [ ]  Asthma,  [ ]  Wheezing [ ]  COPD Musculoskeletal:  [ ]  Arthritis, [ ]  Low back pain, [ ]  Joint pain Hematologic: [x ] Easy Bruising, [ ]  Anemia; [ ]  Hepatitis Gastrointestinal: [ ]  Blood in stool, [ ]  Gastroesophageal Reflux/heartburn, Urinary: [ ]   chronic Kidney disease, [ ]  on HD - [ ]  MWF or [ ]  TTHS, [ ]  Burning with urination, [ ]  Difficulty urinating Skin: [ ]  Rashes, [ ]  Wounds Psychological: [ ]  Anxiety, [ ]  Depression  Physical Examination Filed Vitals:   03/30/15 1248 03/30/15 1500  BP: 186/103   Pulse: 104   Temp: 98.1 F (36.7 C)   TempSrc: Oral   Resp: 18   Weight:  200 lb (90.719 kg)  SpO2: 94%  Body mass index is 24.35 kg/(m^2).  General:  WDWN in NAD Gait: B BKA HENT: WNL Eyes: Pupils equal Pulmonary: normal non-labored breathing , without Rales, rhonchi,  wheezing Cardiac: RRR, without  Murmurs, rubs or gallops; No carotid bruits Abdomen: soft, NT, no masses Skin: no rashes, ulcers noted;  no Gangrene , no cellulitis; no open wounds;  Right stump soft with out erythema , edema or open wounds Vascular Exam/Pulses:Palpable femoral pulses, non palpable popliteal pulses bilaterally   Musculoskeletal: no muscle wasting or atrophy; no edema  Neurologic: A&O X 3; Appropriate Affect ;  SENSATION: normal grossly with tenderness to palpation of the right stump  Speech is fluent/normal   Significant Diagnostic Studies: CBC Lab Results  Component Value Date   WBC 6.3 03/30/2015   HGB 17.5* 03/30/2015   HCT 50.5 03/30/2015   MCV 85.2 03/30/2015   PLT 185 03/30/2015    BMET    Component Value Date/Time   NA 133* 03/30/2015 1457   K 4.9 03/30/2015 1457   CL 96* 03/30/2015 1457   CO2 21* 03/30/2015 1457   GLUCOSE 170* 03/30/2015 1457   BUN 9 03/30/2015 1457   CREATININE 0.98 03/30/2015 1457   CREATININE 0.91 11/26/2014 1125   CALCIUM 9.9 03/30/2015 1457   GFRNONAA >60 03/30/2015 1457   GFRAA >60 03/30/2015 1457   Estimated Creatinine Clearance: 103.3 mL/min (by C-G formula based on Cr of 0.98).  COAG Lab Results  Component Value Date   INR 1.05 11/21/2013   INR 0.88 04/22/2013   INR 0.99 04/24/2012     Non-Invasive Vascular Imaging:  Vascular Ultrasound Right lower extremity venous  duplex has been completed. Preliminary findings: Appears to be indeterminate age vs chronic DVT in the distal right femoral vein.  Incidentally, the right mid to distal femoral artery and right popliteal artery are occluded.  ASSESSMENT/PLAN:  PAD  with right mid to distal femoral artery and right popliteal artery are occluded. He has no open wounds. Recommend pain control and attentive care to the BKA right stump for any future non healing wounds.    Laurence Slate [facility] 03/30/2015 3:58 PM

## 2015-03-30 NOTE — Consult Note (Signed)
Consult Note  Patient name: Steven [name] MRN: [id number] DOB: [birth year] Sex: male  Consulting Physician:  Emergency department  Reason for Consult:  Chief Complaint  Patient presents with  . Leg Pain    HISTORY OF PRESENT ILLNESS: This is a 54 year old gentleman with history of osteomyelitis, status post bilateral below-knee amputations by orthopedics who presented to the emergency department today with a 1-2 day history of pain in his right stump.  He does ambulate with bilateral prosthesis.  He also reported some swelling.  There were no inciting events causing the discomfort.  There are no alleviating factors for his pain currently.  He denies fevers and chills.  The patient has a history of uncontrolled diabetes.  His most recent hemoglobin A1c was 11.4.  Patient suffers from hypercholesterolemia which is managed with a statin.  He is on ACE inhibitor for hypertension.  He has a history of DVT as well as atrial fibrillation.  He has been not regularly taking his anticoagulation [name])  Past Medical History  Diagnosis Date  . Uncontrolled diabetes mellitus ([place])   . HTN (hypertension)   . History of DVT (deep vein thrombosis)   . Personal history of diabetic foot ulcer   . Peripheral vascular disease ([place])     a. Abnl ABIs 2014.  Marland Kitchen Peripheral neuropathy ([place]) 11/19/2011  . Varicose vein     legs  . Osteomyelitis of foot, right, acute ([place]) 08/30/2012  . Personal history of colonic adenomas 05/29/2013  . History of cocaine use   . PAF (paroxysmal atrial fibrillation) ([place])     a. Dx 11/2013 in setting of sepsis/foot infection.  . Tobacco abuse   . H/O ETOH abuse   . Dysrhythmia     afib last admission  . Seasonal allergies     Past Surgical History  Procedure Laterality Date  . Amputation  04/21/2011    Procedure: AMPUTATION RAY;  Surgeon: Newt Minion, MD;  Location: [facility];  Service: Orthopedics;  Laterality: Right;  Rt foot 2nd ray ampt  . I&d extremity   05/03/2011    Procedure: IRRIGATION AND DEBRIDEMENT EXTREMITY;  Surgeon: Newt Minion, MD;  Location: [facility];  Service: Orthopedics;  Laterality: Right;  Irrigation and Debridement Right Foot and Place Acell Xenograft  . Toe amputation  04/24/2012    great toe   right foot  . Amputation  04/24/2012    Procedure: AMPUTATION RAY;  Surgeon: Newt Minion, MD;  Location: [facility];  Service: Orthopedics;  Laterality: Right;  Right foot 1st ray amputation  . Amputation Left 04/23/2013    Procedure: AMPUTATION RAY;  Surgeon: Newt Minion, MD;  Location: [facility];  Service: Orthopedics;  Laterality: Left;  Left Foot 5th Ray Amputation  . Colonoscopy    . Amputation Left 11/21/2013    Procedure: AMPUTATION RAY;  Surgeon: Newt Minion, MD;  Location: [place] [facility];  Service: Orthopedics;  Laterality: Left;  Left Foot 1st & 2nd Ray Amputation  . Amputation Right 12/04/2013    Procedure: AMPUTATION BELOW KNEE;  Surgeon: Marianna Payment, MD;  Location: [facility];  Service: Orthopedics;  Laterality: Right;  . Amputation Left 12/29/2013    Procedure: LEFT AMPUTATION BELOW KNEE;  Surgeon: Marianna Payment, MD;  Location: [facility];  Service: Orthopedics;  Laterality: Left;    Social History   Social History  . Marital Status: Single    Spouse Name: N/A  . Number of  Children: N/A  . Years of Education: N/A   Occupational History  . Not on file.   Social History Main Topics  . Smoking status: Current Every Day Smoker -- 0.50 packs/day for 30 years    Types: Cigarettes    Start date: 06/13/1983  . Smokeless tobacco: Never Used  . Alcohol Use: No     Comment: t states he hs rank any alcholol rcently.  drinks off and on  . Drug Use: No     Comment: ast use of cocane an marjuana. denies  . Sexual Activity: Yes    Birth Control/ Protection: Injection     Comment: on patch   Other Topics Concern  . Not on file   Social History Narrative    Family History  Problem Relation Age of Onset  . Diabetes Mother   .  Stroke Brother   . Colon cancer Neg Hx     Allergies as of 03/30/2015  . (No Known Allergies)    No current facility-administered medications on file prior to encounter.   Current Outpatient Prescriptions on File Prior to Encounter  Medication Sig Dispense Refill  . acetaminophen (TYLENOL) 325 MG tablet Take 2 tablets (650 mg total) by mouth every 6 (six) hours as needed. 60 tablet 1  . diltiazem (CARDIZEM CD) 120 MG 24 hr capsule Take 120 mg by mouth daily.    Marland Kitchen doxycycline (VIBRAMYCIN) 100 MG capsule Take 100 mg by mouth 2 (two) times daily.    . fluticasone (FLONASE) 50 MCG/ACT nasal spray Place 2 sprays into both nostrils daily. 16 g 1  . folic acid (FOLVITE) 1 MG tablet Take 1 tablet (1 mg total) by mouth daily. 30 tablet 1  . gabapentin (NEURONTIN) 300 MG capsule Take 300mg  every morning, 300mg  every day at noon, and 600mg  every night at bedtime. 120 capsule 5  . insulin aspart (NOVOLOG) 100 UNIT/ML injection Inject 10-15 Units into the skin daily before supper. 1 vial PRN  . insulin glargine (LANTUS) 100 UNIT/ML injection Inject 0.45 mLs (45 Units total) into the skin daily before breakfast. 10 mL 6  . lisinopril (PRINIVIL,ZESTRIL) 20 MG tablet Take 1 tablet (20 mg total) by mouth daily. For blood pressure 90 tablet 0  . nitroGLYCERIN (NITRODUR - DOSED IN MG/24 HR) 0.2 mg/hr patch Place 1 patch (0.2 mg total) onto the skin daily. 30 patch 5  . pantoprazole (PROTONIX) 40 MG tablet Take 1 tablet (40 mg total) by mouth at bedtime. 90 tablet 1  . rivaroxaban (XARELTO) 20 MG TABS tablet Take 1 tablet (20 mg total) by mouth daily with supper. For Atrial Fibrillation 90 tablet 1  . atorvastatin (LIPITOR) 40 MG tablet Take 1 tablet (40 mg total) by mouth daily. 30 tablet 11     REVIEW OF SYSTEMS: Cardiovascular: No chest pain, chest pressure positive leg swelling on the right Pulmonary: No productive cough, asthma or wheezing. Neurologic: No weakness, paresthesias, aphasia, or  amaurosis. No dizziness. Hematologic: No bleeding problems or clotting disorders. Musculoskeletal: Positive for pain in his stump on the right Gastrointestinal: No blood in stool or hematemesis Genitourinary: No dysuria or hematuria. Psychiatric:: No history of major depression. Integumentary: No rashes or ulcers. Constitutional: No fever or chills.  PHYSICAL EXAMINATION: General: The patient appears their stated age.  Vital signs are BP 150/88 mmHg  Pulse 88  Temp(Src) 98.1 F (36.7 C) (Oral)  Resp 18  Wt 200 lb (90.719 kg)  SpO2 96% Pulmonary: Respirations are non-labored HEENT:  No  gross abnormalities Abdomen: Soft and non-tender  Musculoskeletal: The right below-knee amputation stump is cooler and tender.  It does not appear ischemic   Neurologic: No focal weakness or paresthesias are detected, Skin: There are no ulcer or rashes noted. Psychiatric: The patient has normal affect. Cardiovascular: There is a regular rate and rhythm without significant murmur appreciated.  Palpable femoral pulses  Diagnostic Studies: I have reviewed the patient's duplex ultrasound.  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  The right mid and distal femoral and popliteal artery are occluded.  I reviewed the images and the patient appears to have a collateral in the mid superficial femoral artery suggesting chronic disease.  In addition he has triphasic waveforms in the profunda femoral artery    Assessment:  Painful right below-knee amputation stump Plan: I have reviewed his vascular lab images.  Even though his superficial femoral and popliteal artery are occluded, this appears to be chronic as there are collaterals visualized.  In addition he has palpable right femoral pulse and triphasic waveforms in his profunda femoral artery which should be adequate blood flow to keep his stump from being ischemic.  I suspect that there is something else causing his pain.  I  would not recommend vascular surgery intervention at this time.     Eldridge Abrahams, M.D. [facility] Office: [phone] Pager:  [phone]

## 2015-03-30 NOTE — Progress Notes (Signed)
*  PRELIMINARY RESULTS* Vascular Ultrasound Right lower extremity venous duplex has been completed.  Preliminary findings: Appears to be indeterminate age vs chronic DVT in the distal right femoral vein.  Incidentally, the right mid to distal femoral artery and right popliteal artery are occluded.  Called results to Margarita Mail.   Landry Mellow, RDMS, RVT  03/30/2015, 3:05 PM

## 2015-03-30 NOTE — ED Notes (Signed)
Pt hx bilateral BKA. Reports pain to right lower leg since yesterday. Denies injury. No swelling/redness noted. NAD.

## 2015-03-30 NOTE — ED Notes (Signed)
Pt noted to have SpO2 of 86-88% RA while sleeping.  Notified primary RN and [place], [place].  Did not place on O2 due to immediate increase in SpO2 while wake to 96-100%.

## 2015-03-30 NOTE — Progress Notes (Addendum)
ANTICOAGULATION CONSULT NOTE - Initial Consult  Pharmacy Consult for heparin Indication: DVT  No Known Allergies  Patient Measurements: Weight: 200 lb (90.719 kg)   Vital Signs: Temp: 98.1 F (36.7 C) (10/18 1248) Temp Source: Oral (10/18 1248) BP: 186/103 mmHg (10/18 1248) Pulse Rate: 104 (10/18 1248)  Labs:  Recent Labs  03/30/15 1457  HGB 17.5*  HCT 50.5  PLT 185     Medical History: Past Medical History  Diagnosis Date  . Uncontrolled diabetes mellitus ([place])   . HTN (hypertension)   . History of DVT (deep vein thrombosis)   . Personal history of diabetic foot ulcer   . Peripheral vascular disease ([place])     a. Abnl ABIs 2014.  Marland Kitchen Peripheral neuropathy ([place]) 11/19/2011  . Varicose vein     legs  . Osteomyelitis of foot, right, acute ([place]) 08/30/2012  . Personal history of colonic adenomas 05/29/2013  . History of cocaine use   . PAF (paroxysmal atrial fibrillation) ([place])     a. Dx 11/2013 in setting of sepsis/foot infection.  . Tobacco abuse   . H/O ETOH abuse   . Dysrhythmia     afib last admission  . Seasonal allergies      Assessment: 35 yoM admitted with R lower leg pain found to have DVT in R femoral distal vein in setting of bilateral BKA Self discontinued xarelto about one month ago.  Goal of Therapy:  Heparin level 0.3-0.7 units/ml Monitor platelets by anticoagulation protocol: Yes   Plan:  Give 5000 units bolus x 1 Start heparin infusion at 1400 units/hr Check anti-Xa level in 6 hours and daily while on heparin Continue to monitor H&H and platelets    **addendum- heparin orders discontinued as feel that DVT was chronic and not acute. No anti-coagulation planned at this time. Will s/o; please re consult if needed.    Vincenza Hews, PharmD, BCPS 03/30/2015, 3:43 PM Pager: [phone]

## 2015-03-30 NOTE — Discharge Instructions (Signed)

## 2015-03-30 NOTE — ED Notes (Signed)
Vascular surgery PA at bedside  

## 2015-04-14 ENCOUNTER — Inpatient Hospital Stay (HOSPITAL_COMMUNITY)
Admission: EM | Admit: 2015-04-14 | Discharge: 2015-04-19 | DRG: 475 | Disposition: A | Discharge status: Rehabilitation Facility | Payer: Medicaid Other | Attending: Family Medicine | Admitting: Family Medicine

## 2015-04-14 ENCOUNTER — Encounter (HOSPITAL_COMMUNITY): Payer: Self-pay | Admitting: Neurology

## 2015-04-14 ENCOUNTER — Encounter: Payer: Self-pay | Admitting: Family Medicine

## 2015-04-14 ENCOUNTER — Ambulatory Visit (INDEPENDENT_AMBULATORY_CARE_PROVIDER_SITE_OTHER): Payer: Medicaid Other | Admitting: Family Medicine

## 2015-04-14 VITALS — BP 155/92 | HR 78 | Temp 98.7°F

## 2015-04-14 DIAGNOSIS — M170 Bilateral primary osteoarthritis of knee: Secondary | ICD-10-CM | POA: Diagnosis present

## 2015-04-14 DIAGNOSIS — E1152 Type 2 diabetes mellitus with diabetic peripheral angiopathy with gangrene: Secondary | ICD-10-CM | POA: Diagnosis present

## 2015-04-14 DIAGNOSIS — I10 Essential (primary) hypertension: Secondary | ICD-10-CM | POA: Diagnosis present

## 2015-04-14 DIAGNOSIS — Z86718 Personal history of other venous thrombosis and embolism: Secondary | ICD-10-CM

## 2015-04-14 DIAGNOSIS — Z89512 Acquired absence of left leg below knee: Secondary | ICD-10-CM | POA: Diagnosis not present

## 2015-04-14 DIAGNOSIS — I480 Paroxysmal atrial fibrillation: Secondary | ICD-10-CM | POA: Diagnosis present

## 2015-04-14 DIAGNOSIS — Z7901 Long term (current) use of anticoagulants: Secondary | ICD-10-CM

## 2015-04-14 DIAGNOSIS — Z9119 Patient's noncompliance with other medical treatment and regimen: Secondary | ICD-10-CM | POA: Diagnosis not present

## 2015-04-14 DIAGNOSIS — Z23 Encounter for immunization: Secondary | ICD-10-CM | POA: Diagnosis not present

## 2015-04-14 DIAGNOSIS — E11319 Type 2 diabetes mellitus with unspecified diabetic retinopathy without macular edema: Secondary | ICD-10-CM | POA: Diagnosis present

## 2015-04-14 DIAGNOSIS — E785 Hyperlipidemia, unspecified: Secondary | ICD-10-CM | POA: Diagnosis present

## 2015-04-14 DIAGNOSIS — Y848 Other medical procedures as the cause of abnormal reaction of the patient, or of later complication, without mention of misadventure at the time of the procedure: Secondary | ICD-10-CM | POA: Diagnosis present

## 2015-04-14 DIAGNOSIS — Z7982 Long term (current) use of aspirin: Secondary | ICD-10-CM

## 2015-04-14 DIAGNOSIS — J302 Other seasonal allergic rhinitis: Secondary | ICD-10-CM | POA: Diagnosis present

## 2015-04-14 DIAGNOSIS — I4891 Unspecified atrial fibrillation: Secondary | ICD-10-CM | POA: Diagnosis present

## 2015-04-14 DIAGNOSIS — F17210 Nicotine dependence, cigarettes, uncomplicated: Secondary | ICD-10-CM | POA: Diagnosis present

## 2015-04-14 DIAGNOSIS — E119 Type 2 diabetes mellitus without complications: Secondary | ICD-10-CM | POA: Diagnosis present

## 2015-04-14 DIAGNOSIS — F1410 Cocaine abuse, uncomplicated: Secondary | ICD-10-CM | POA: Diagnosis present

## 2015-04-14 DIAGNOSIS — G629 Polyneuropathy, unspecified: Secondary | ICD-10-CM | POA: Diagnosis present

## 2015-04-14 DIAGNOSIS — Z89612 Acquired absence of left leg above knee: Secondary | ICD-10-CM

## 2015-04-14 DIAGNOSIS — E1100 Type 2 diabetes mellitus with hyperosmolarity without nonketotic hyperglycemic-hyperosmolar coma (NKHHC): Secondary | ICD-10-CM

## 2015-04-14 DIAGNOSIS — Z794 Long term (current) use of insulin: Secondary | ICD-10-CM | POA: Diagnosis not present

## 2015-04-14 DIAGNOSIS — I739 Peripheral vascular disease, unspecified: Secondary | ICD-10-CM | POA: Diagnosis present

## 2015-04-14 DIAGNOSIS — Z89611 Acquired absence of right leg above knee: Secondary | ICD-10-CM | POA: Diagnosis not present

## 2015-04-14 DIAGNOSIS — Z89511 Acquired absence of right leg below knee: Secondary | ICD-10-CM

## 2015-04-14 DIAGNOSIS — IMO0002 Reserved for concepts with insufficient information to code with codable children: Secondary | ICD-10-CM | POA: Diagnosis present

## 2015-04-14 DIAGNOSIS — E1165 Type 2 diabetes mellitus with hyperglycemia: Secondary | ICD-10-CM | POA: Diagnosis present

## 2015-04-14 DIAGNOSIS — F4321 Adjustment disorder with depressed mood: Secondary | ICD-10-CM | POA: Diagnosis not present

## 2015-04-14 DIAGNOSIS — Z89522 Acquired absence of left knee: Secondary | ICD-10-CM | POA: Diagnosis not present

## 2015-04-14 DIAGNOSIS — F1010 Alcohol abuse, uncomplicated: Secondary | ICD-10-CM | POA: Diagnosis present

## 2015-04-14 DIAGNOSIS — I96 Gangrene, not elsewhere classified: Secondary | ICD-10-CM | POA: Diagnosis not present

## 2015-04-14 DIAGNOSIS — M79604 Pain in right leg: Secondary | ICD-10-CM | POA: Diagnosis present

## 2015-04-14 DIAGNOSIS — Z79899 Other long term (current) drug therapy: Secondary | ICD-10-CM | POA: Diagnosis not present

## 2015-04-14 DIAGNOSIS — T8743 Infection of amputation stump, right lower extremity: Principal | ICD-10-CM | POA: Diagnosis present

## 2015-04-14 DIAGNOSIS — E1310 Other specified diabetes mellitus with ketoacidosis without coma: Secondary | ICD-10-CM | POA: Diagnosis not present

## 2015-04-14 LAB — RAPID URINE DRUG SCREEN, HOSP PERFORMED
AMPHETAMINES: NOT DETECTED
Barbiturates: NOT DETECTED
Benzodiazepines: NOT DETECTED
COCAINE: POSITIVE — AB
OPIATES: NOT DETECTED
TETRAHYDROCANNABINOL: NOT DETECTED

## 2015-04-14 LAB — I-STAT CHEM 8, ED
BUN: 7 mg/dL (ref 6–20)
CALCIUM ION: 1.16 mmol/L (ref 1.12–1.23)
CHLORIDE: 94 mmol/L — AB (ref 101–111)
Creatinine, Ser: 0.7 mg/dL (ref 0.61–1.24)
GLUCOSE: 408 mg/dL — AB (ref 65–99)
HCT: 50 % (ref 39.0–52.0)
HEMOGLOBIN: 17 g/dL (ref 13.0–17.0)
POTASSIUM: 3.5 mmol/L (ref 3.5–5.1)
SODIUM: 134 mmol/L — AB (ref 135–145)
TCO2: 25 mmol/L (ref 0–100)

## 2015-04-14 LAB — LIPID PANEL
CHOLESTEROL: 182 mg/dL (ref 0–200)
HDL: 38 mg/dL — ABNORMAL LOW (ref >40)
LDL Cholesterol: 114 mg/dL — ABNORMAL HIGH (ref 0–99)
Total CHOL/HDL Ratio: 4.8 RATIO
Triglycerides: 151 mg/dL — ABNORMAL HIGH (ref <150)
VLDL: 30 mg/dL (ref 0–40)

## 2015-04-14 LAB — POCT GLYCOSYLATED HEMOGLOBIN (HGB A1C): HEMOGLOBIN A1C: 11.2

## 2015-04-14 LAB — COMPREHENSIVE METABOLIC PANEL
ALK PHOS: 89 U/L (ref 38–126)
ALT: 21 U/L (ref 17–63)
AST: 27 U/L (ref 15–41)
Albumin: 2.2 g/dL — ABNORMAL LOW (ref 3.5–5.0)
Anion gap: 10 (ref 5–15)
BUN: 8 mg/dL (ref 6–20)
CALCIUM: 9 mg/dL (ref 8.9–10.3)
CHLORIDE: 100 mmol/L — AB (ref 101–111)
CO2: 25 mmol/L (ref 22–32)
CREATININE: 0.84 mg/dL (ref 0.61–1.24)
GFR calc Af Amer: >60 mL/min (ref >60)
Glucose, Bld: 247 mg/dL — ABNORMAL HIGH (ref 65–99)
Potassium: 3.6 mmol/L (ref 3.5–5.1)
Sodium: 135 mmol/L (ref 135–145)
Total Bilirubin: 0.2 mg/dL — ABNORMAL LOW (ref 0.3–1.2)
Total Protein: 6.1 g/dL — ABNORMAL LOW (ref 6.5–8.1)

## 2015-04-14 LAB — CBC WITH DIFFERENTIAL/PLATELET
BASOS ABS: 0 10*3/uL (ref 0.0–0.1)
BASOS ABS: 0 10*3/uL (ref 0.0–0.1)
BASOS PCT: 0 %
Basophils Relative: 0 %
EOS ABS: 0.1 10*3/uL (ref 0.0–0.7)
EOS PCT: 1 %
EOS PCT: 1 %
Eosinophils Absolute: 0.1 10*3/uL (ref 0.0–0.7)
HCT: 43.4 % (ref 39.0–52.0)
HEMATOCRIT: 42.4 % (ref 39.0–52.0)
HEMOGLOBIN: 14.6 g/dL (ref 13.0–17.0)
Hemoglobin: 15.1 g/dL (ref 13.0–17.0)
LYMPHS ABS: 2.4 10*3/uL (ref 0.7–4.0)
LYMPHS PCT: 28 %
Lymphocytes Relative: 22 %
Lymphs Abs: 1.6 10*3/uL (ref 0.7–4.0)
MCH: 29.3 pg (ref 26.0–34.0)
MCH: 29.3 pg (ref 26.0–34.0)
MCHC: 34.8 g/dL (ref 30.0–36.0)
MCHC: 34.4 g/dL (ref 30.0–36.0)
MCV: 84.3 fL (ref 78.0–100.0)
MCV: 85 fL (ref 78.0–100.0)
MONO ABS: 0.8 10*3/uL (ref 0.1–1.0)
Monocytes Absolute: 0.7 10*3/uL (ref 0.1–1.0)
Monocytes Relative: 11 %
Monocytes Relative: 8 %
NEUTROS ABS: 5.4 10*3/uL (ref 1.7–7.7)
NEUTROS PCT: 63 %
Neutro Abs: 4.9 10*3/uL (ref 1.7–7.7)
Neutrophils Relative %: 66 %
PLATELETS: 297 10*3/uL (ref 150–400)
PLATELETS: 280 10*3/uL (ref 150–400)
RBC: 5.15 MIL/uL (ref 4.22–5.81)
RBC: 4.99 MIL/uL (ref 4.22–5.81)
RDW: 14.1 % (ref 11.5–15.5)
RDW: 14.2 % (ref 11.5–15.5)
WBC: 7.4 10*3/uL (ref 4.0–10.5)
WBC: 8.6 10*3/uL (ref 4.0–10.5)

## 2015-04-14 LAB — CBG MONITORING, ED: GLUCOSE-CAPILLARY: 277 mg/dL — AB (ref 65–99)

## 2015-04-14 LAB — PROTIME-INR
INR: 1.06 (ref 0.00–1.49)
Prothrombin Time: 14 seconds (ref 11.6–15.2)

## 2015-04-14 LAB — TYPE AND SCREEN
ABO/RH(D): B POS
Antibody Screen: NEGATIVE

## 2015-04-14 LAB — ABO/RH: ABO/RH(D): B POS

## 2015-04-14 LAB — GLUCOSE, CAPILLARY
Glucose-Capillary: 344 mg/dL — ABNORMAL HIGH (ref 65–99)
Glucose-Capillary: 224 mg/dL — ABNORMAL HIGH (ref 65–99)

## 2015-04-14 MED ORDER — LORAZEPAM 1 MG PO TABS
1.0000 mg | ORAL_TABLET | Freq: Four times a day (QID) | ORAL | Status: AC | PRN
  Administered 2015-04-17: 1 mg via ORAL
  Filled 2015-04-14: qty 1

## 2015-04-14 MED ORDER — OXYCODONE-ACETAMINOPHEN 5-325 MG PO TABS
1.0000 | ORAL_TABLET | ORAL | Status: DC | PRN
Start: 2015-04-14 — End: 2015-04-19
  Administered 2015-04-14 – 2015-04-19 (×19): 2 via ORAL
  Filled 2015-04-14 (×20): qty 2

## 2015-04-14 MED ORDER — MORPHINE SULFATE (PF) 2 MG/ML IV SOLN
2.0000 mg | Freq: Once | INTRAVENOUS | Status: AC
  Administered 2015-04-14: 2 mg via INTRAVENOUS
  Filled 2015-04-14: qty 1

## 2015-04-14 MED ORDER — CEFAZOLIN SODIUM-DEXTROSE 2-3 GM-% IV SOLR
2.0000 g | INTRAVENOUS | Status: AC
  Administered 2015-04-15: 2 g via INTRAVENOUS
  Filled 2015-04-14 (×2): qty 50

## 2015-04-14 MED ORDER — ENOXAPARIN SODIUM 40 MG/0.4ML ~~LOC~~ SOLN
40.0000 mg | SUBCUTANEOUS | Status: DC
  Administered 2015-04-14: 40 mg via SUBCUTANEOUS
  Filled 2015-04-14: qty 0.4

## 2015-04-14 MED ORDER — SODIUM CHLORIDE 0.45 % IV SOLN
INTRAVENOUS | Status: DC
  Administered 2015-04-14: 21:00:00 via INTRAVENOUS

## 2015-04-14 MED ORDER — NICOTINE 21 MG/24HR TD PT24
21.0000 mg | MEDICATED_PATCH | Freq: Every day | TRANSDERMAL | Status: DC
  Administered 2015-04-16 – 2015-04-19 (×4): 21 mg via TRANSDERMAL
  Filled 2015-04-14 (×4): qty 1

## 2015-04-14 MED ORDER — GABAPENTIN 300 MG PO CAPS
300.0000 mg | ORAL_CAPSULE | Freq: Three times a day (TID) | ORAL | Status: DC
  Administered 2015-04-14 – 2015-04-17 (×7): 300 mg via ORAL
  Filled 2015-04-14 (×7): qty 1

## 2015-04-14 MED ORDER — ASPIRIN 325 MG PO TABS
325.0000 mg | ORAL_TABLET | Freq: Every day | ORAL | Status: DC
  Administered 2015-04-14 – 2015-04-19 (×5): 325 mg via ORAL
  Filled 2015-04-14 (×5): qty 1

## 2015-04-14 MED ORDER — LORAZEPAM 2 MG/ML IJ SOLN
1.0000 mg | Freq: Four times a day (QID) | INTRAMUSCULAR | Status: AC | PRN
  Administered 2015-04-15: 1 mg via INTRAVENOUS
  Filled 2015-04-14: qty 1

## 2015-04-14 MED ORDER — DILTIAZEM HCL ER COATED BEADS 120 MG PO CP24
120.0000 mg | ORAL_CAPSULE | Freq: Every day | ORAL | Status: DC
  Administered 2015-04-14 – 2015-04-19 (×5): 120 mg via ORAL
  Filled 2015-04-14 (×6): qty 1

## 2015-04-14 MED ORDER — ADULT MULTIVITAMIN W/MINERALS CH
1.0000 | ORAL_TABLET | Freq: Every day | ORAL | Status: DC
Start: 2015-04-14 — End: 2015-04-19
  Administered 2015-04-14 – 2015-04-19 (×5): 1 via ORAL
  Filled 2015-04-14 (×5): qty 1

## 2015-04-14 MED ORDER — INSULIN ASPART 100 UNIT/ML ~~LOC~~ SOLN
0.0000 [IU] | Freq: Every day | SUBCUTANEOUS | Status: DC
  Administered 2015-04-14: 2 [IU] via SUBCUTANEOUS
  Administered 2015-04-16 – 2015-04-17 (×2): 3 [IU] via SUBCUTANEOUS
  Administered 2015-04-18: 4 [IU] via SUBCUTANEOUS

## 2015-04-14 MED ORDER — INSULIN ASPART 100 UNIT/ML ~~LOC~~ SOLN
0.0000 [IU] | Freq: Three times a day (TID) | SUBCUTANEOUS | Status: DC
  Administered 2015-04-14: 11 [IU] via SUBCUTANEOUS
  Administered 2015-04-15: 2 [IU] via SUBCUTANEOUS
  Administered 2015-04-16: 3 [IU] via SUBCUTANEOUS

## 2015-04-14 MED ORDER — THIAMINE HCL 100 MG/ML IJ SOLN: 100.0000 mg | Freq: Every day | INTRAMUSCULAR | Status: DC

## 2015-04-14 MED ORDER — FOLIC ACID 1 MG PO TABS
1.0000 mg | ORAL_TABLET | Freq: Every day | ORAL | Status: DC
  Administered 2015-04-14 – 2015-04-19 (×5): 1 mg via ORAL
  Filled 2015-04-14 (×5): qty 1

## 2015-04-14 MED ORDER — NICOTINE 21 MG/24HR TD PT24: 21.0000 mg | MEDICATED_PATCH | Freq: Every day | TRANSDERMAL | Status: DC

## 2015-04-14 MED ORDER — PANTOPRAZOLE SODIUM 40 MG PO TBEC
40.0000 mg | DELAYED_RELEASE_TABLET | Freq: Every day | ORAL | Status: DC
  Administered 2015-04-14 – 2015-04-18 (×5): 40 mg via ORAL
  Filled 2015-04-14 (×5): qty 1

## 2015-04-14 MED ORDER — VITAMIN B-1 100 MG PO TABS
100.0000 mg | ORAL_TABLET | Freq: Every day | ORAL | Status: DC
  Administered 2015-04-14 – 2015-04-19 (×5): 100 mg via ORAL
  Filled 2015-04-14 (×5): qty 1

## 2015-04-14 MED ORDER — LISINOPRIL 20 MG PO TABS
20.0000 mg | ORAL_TABLET | Freq: Every day | ORAL | Status: DC
  Administered 2015-04-14 – 2015-04-19 (×5): 20 mg via ORAL
  Filled 2015-04-14 (×4): qty 1

## 2015-04-14 MED ORDER — INSULIN GLARGINE 100 UNIT/ML ~~LOC~~ SOLN
30.0000 [IU] | Freq: Every day | SUBCUTANEOUS | Status: DC
  Administered 2015-04-14: 30 [IU] via SUBCUTANEOUS
  Filled 2015-04-14 (×2): qty 0.3

## 2015-04-14 MED ORDER — ATORVASTATIN CALCIUM 40 MG PO TABS
40.0000 mg | ORAL_TABLET | Freq: Every day | ORAL | Status: DC
  Administered 2015-04-14 – 2015-04-19 (×5): 40 mg via ORAL
  Filled 2015-04-14 (×5): qty 1

## 2015-04-14 NOTE — Progress Notes (Signed)
NURSING PROGRESS NOTE  Steven [name] [id number] Admission Data: 04/14/2015 3:57 PM Attending Provider: Zenia Resides, MD [id number] Steven Mems, MD Code Status: Full   Steven [name] is a 54 y.o. male patient admitted from ED:  -No acute distress noted.  -No complaints of shortness of breath.  -No complaints of chest pain.   Blood pressure 159/93, pulse 70, temperature 98.6 F (37 C), temperature source Oral, resp. rate 18, SpO2 98 %.   IV Fluids:  IV in place, occlusive dsg intact without redness, IV cath hand right, condition patent and no redness none.   Allergies:  Review of patient's allergies indicates no known allergies.  Past Medical History:   has a past medical history of Uncontrolled diabetes mellitus ([place]); HTN (hypertension); History of DVT (deep vein thrombosis); Personal history of diabetic foot ulcer; Peripheral vascular disease ([place]); Peripheral neuropathy ([place]) (11/19/2011); Varicose vein; Osteomyelitis of foot, right, acute ([place]) (08/30/2012); Personal history of colonic adenomas (05/29/2013); History of cocaine use; PAF (paroxysmal atrial fibrillation) ([place]); Tobacco abuse; H/O ETOH abuse; Dysrhythmia; and Seasonal allergies.  Past Surgical History:   has past surgical history that includes Amputation (04/21/2011); I&D extremity (05/03/2011); Toe amputation (04/24/2012); Amputation (04/24/2012); Amputation (Left, 04/23/2013); Colonoscopy; Amputation (Left, 11/21/2013); Amputation (Right, 12/04/2013); and Amputation (Left, 12/29/2013).  Social History:   reports that he has been smoking Cigarettes.  He started smoking about 31 years ago. He has a 15 pack-year smoking history. He has never used smokeless tobacco. He reports that he drinks about 3.0 - 3.6 oz of alcohol per week. He reports that he does not use illicit drugs.  Skin: Escar to right stump  Family medicine team notified of patient's arrival.  Patient/Family orientated to room. Information packet given to  patient/family. Admission inpatient armband information verified with patient/family to include name and date of birth and placed on patient arm. Side rails up x 2, fall assessment and education completed with patient/family. Patient/family able to verbalize understanding of risk associated with falls and verbalized understanding to call for assistance before getting out of bed. Call light within reach. Patient/family able to voice and demonstrate understanding of unit orientation instructions.    Will continue to evaluate and treat per MD orders.

## 2015-04-14 NOTE — Consult Note (Signed)
ORTHOPAEDIC CONSULTATION  REQUESTING PHYSICIAN: Orlie Dakin, MD  Chief Complaint: R BKA eschar  HPI: Steven [name] is a 54 y.o. male who presents with 3-4 weeks of right BKA eschar and blister.  He presented to ER 3-4 weeks ago and was evaluated by vascular surgery.  Doppler found no DVT but incidental occlusion of distal femoral and popliteal arteries.  MRI was negative for osteo.  He presents today with mature eschar of posterior BKA stump on the right.  Denies f/c/n/v.    Past Medical History  Diagnosis Date  . Uncontrolled diabetes mellitus ([place])   . HTN (hypertension)   . History of DVT (deep vein thrombosis)   . Personal history of diabetic foot ulcer   . Peripheral vascular disease ([place])     a. Abnl ABIs 2014.  Marland Kitchen Peripheral neuropathy ([place]) 11/19/2011  . Varicose vein     legs  . Osteomyelitis of foot, right, acute ([place]) 08/30/2012  . Personal history of colonic adenomas 05/29/2013  . History of cocaine use   . PAF (paroxysmal atrial fibrillation) ([place])     a. Dx 11/2013 in setting of sepsis/foot infection.  . Tobacco abuse   . H/O ETOH abuse   . Dysrhythmia     afib last admission  . Seasonal allergies    Past Surgical History  Procedure Laterality Date  . Amputation  04/21/2011    Procedure: AMPUTATION RAY;  Surgeon: Newt Minion, MD;  Location: [place];  Service: Orthopedics;  Laterality: Right;  Rt foot 2nd ray ampt  . I&d extremity  05/03/2011    Procedure: IRRIGATION AND DEBRIDEMENT EXTREMITY;  Surgeon: Newt Minion, MD;  Location: [place];  Service: Orthopedics;  Laterality: Right;  Irrigation and Debridement Right Foot and Place Acell Xenograft  . Toe amputation  04/24/2012    great toe   right foot  . Amputation  04/24/2012    Procedure: AMPUTATION RAY;  Surgeon: Newt Minion, MD;  Location: [place];  Service: Orthopedics;  Laterality: Right;  Right foot 1st ray amputation  . Amputation Left 04/23/2013    Procedure: AMPUTATION RAY;  Surgeon: Newt Minion,  MD;  Location: [place];  Service: Orthopedics;  Laterality: Left;  Left Foot 5th Ray Amputation  . Colonoscopy    . Amputation Left 11/21/2013    Procedure: AMPUTATION RAY;  Surgeon: Newt Minion, MD;  Location: [place];  Service: Orthopedics;  Laterality: Left;  Left Foot 1st & 2nd Ray Amputation  . Amputation Right 12/04/2013    Procedure: AMPUTATION BELOW KNEE;  Surgeon: Marianna Payment, MD;  Location: [place];  Service: Orthopedics;  Laterality: Right;  . Amputation Left 12/29/2013    Procedure: LEFT AMPUTATION BELOW KNEE;  Surgeon: Marianna Payment, MD;  Location: [place];  Service: Orthopedics;  Laterality: Left;   Social History   Social History  . Marital Status: Single    Spouse Name: N/A  . Number of Children: N/A  . Years of Education: N/A   Social History Main Topics  . Smoking status: Current Every Day Smoker -- 0.50 packs/day for 30 years    Types: Cigarettes    Start date: 06/13/1983  . Smokeless tobacco: Never Used  . Alcohol Use: 3.0 - 3.6 oz/week    5-6 Standard drinks or equivalent per week     Comment: t states he hs rank any alcholol rcently.  drinks off and on  . Drug Use: No     Comment: ast  use of cocane an marjuana. denies  . Sexual Activity: Yes    Birth Control/ Protection: Injection     Comment: on patch   Other Topics Concern  . None   Social History Narrative   Family History  Problem Relation Age of Onset  . Diabetes Mother   . Stroke Brother   . Colon cancer Neg Hx    No Known Allergies Prior to Admission medications   Medication Sig Start Date End Date Taking? Authorizing Provider  acetaminophen (TYLENOL) 325 MG tablet Take 2 tablets (650 mg total) by mouth every 6 (six) hours as needed. 06/22/14  Yes Leeanne Rio, MD  aspirin 325 MG tablet Take 325 mg by mouth daily.   Yes Historical Provider, MD  cetirizine (ZYRTEC) 10 MG tablet Take 10 mg by mouth daily. 01/28/15  Yes Historical Provider, MD  diltiazem (CARDIZEM CD) 120 MG 24 hr  capsule Take 120 mg by mouth daily.   Yes Historical Provider, MD  insulin aspart (NOVOLOG) 100 UNIT/ML injection Inject 10-15 Units into the skin daily before supper. 11/13/14  Yes Zenia Resides, MD  insulin glargine (LANTUS) 100 UNIT/ML injection Inject 0.45 mLs (45 Units total) into the skin daily before breakfast. 11/12/14  Yes Zenia Resides, MD  lisinopril (PRINIVIL,ZESTRIL) 20 MG tablet Take 1 tablet (20 mg total) by mouth daily. For blood pressure 01/29/15  Yes Alveda Reasons, MD  rivaroxaban (XARELTO) 20 MG TABS tablet Take 1 tablet (20 mg total) by mouth daily with supper. For Atrial Fibrillation 06/22/14  Yes Leeanne Rio, MD  atorvastatin (LIPITOR) 40 MG tablet Take 1 tablet (40 mg total) by mouth daily. Patient not taking: Reported on 04/14/2015 03/31/14   Leeanne Rio, MD  cephALEXin (KEFLEX) 500 MG capsule Take 1 capsule (500 mg total) by mouth 3 (three) times daily. Patient not taking: Reported on 04/14/2015 03/30/15   Margarita Mail, PA-C  fluticasone [facility]) 50 MCG/ACT nasal spray Place 2 sprays into both nostrils daily. Patient not taking: Reported on 04/14/2015 09/09/14   Leeanne Rio, MD  folic acid (FOLVITE) 1 MG tablet Take 1 tablet (1 mg total) by mouth daily. Patient not taking: Reported on 04/14/2015 12/18/13   Ivan Anchors Love, PA-C  gabapentin (NEURONTIN) 300 MG capsule Take 300mg  every morning, 300mg  every day at noon, and 600mg  every night at bedtime. Patient not taking: Reported on 04/14/2015 05/20/14   Leeanne Rio, MD  nitroGLYCERIN (NITRODUR - DOSED IN MG/24 HR) 0.2 mg/hr patch Place 1 patch (0.2 mg total) onto the skin daily. Patient not taking: Reported on 04/14/2015 11/12/14   Zenia Resides, MD  oxyCODONE-acetaminophen (PERCOCET) 5-325 MG tablet Take 1-2 tablets by mouth every 4 (four) hours as needed. Patient not taking: Reported on 04/14/2015 03/30/15   Margarita Mail, PA-C  pantoprazole (PROTONIX) 40 MG tablet Take 1 tablet (40 mg total) by  mouth at bedtime. Patient not taking: Reported on 04/14/2015 09/25/14   Leeanne Rio, MD   No results found.  Positive ROS: All other systems have been reviewed and were otherwise negative with the exception of those mentioned in the HPI and as above.  Physical Exam: General: Alert, no acute distress Cardiovascular: No pedal edema Respiratory: No cyanosis, no use of accessory musculature GI: No organomegaly, abdomen is soft and non-tender Skin: No lesions in the area of chief complaint Neurologic: Sensation intact distally Psychiatric: Patient is competent for consent with normal mood and affect Lymphatic: No axillary or cervical lymphadenopathy  MUSCULOSKELETAL:  - s/p R BKA with healed surgical scar - no fluctuance, induration, drainage cellulitis or swelling or signs of infection - dry eschar on the posterior aspect of BKA stump to the level of the popliteal fossa - eschar is TTP  Assessment: R BKA eschar Vascular insufficiency Uncontrolled DM  Plan: - admit to hospitalist for diabetes control - patient may need AKA - Dr. Sharol Given to see later today for final dispo  Thank you for the consult and the opportunity to see Steven [name]. Eduard Roux, MD [place] 1:56 PM

## 2015-04-14 NOTE — Consult Note (Signed)
Reason for Consult: Dry gangrene right transtibial amputation Referring Physician: Dr. Lovie Macadamia is an 54 y.o. male.  HPI: Patient is a 20 shell gentleman who is a bilateral transtibial amputation. Patient states that he had some right leg pain and put a heating pad around his right transtibial amputation. Patient developed full-thickness dry gangrene of the entire posterior aspect of his transtibial amputation.  Past Medical History  Diagnosis Date  . Uncontrolled diabetes mellitus ([place])   . HTN (hypertension)   . History of DVT (deep vein thrombosis)   . Personal history of diabetic foot ulcer   . Peripheral vascular disease ([place])     a. Abnl ABIs 2014.  Marland Kitchen Peripheral neuropathy ([place]) 11/19/2011  . Varicose vein     legs  . Osteomyelitis of foot, right, acute ([place]) 08/30/2012  . Personal history of colonic adenomas 05/29/2013  . History of cocaine use   . PAF (paroxysmal atrial fibrillation) ([place])     a. Dx 11/2013 in setting of sepsis/foot infection.  . Tobacco abuse   . H/O ETOH abuse   . Dysrhythmia     afib last admission  . Seasonal allergies     Past Surgical History  Procedure Laterality Date  . Amputation  04/21/2011    Procedure: AMPUTATION RAY;  Surgeon: Newt Minion, MD;  Location: [place];  Service: Orthopedics;  Laterality: Right;  Rt foot 2nd ray ampt  . I&d extremity  05/03/2011    Procedure: IRRIGATION AND DEBRIDEMENT EXTREMITY;  Surgeon: Newt Minion, MD;  Location: [place];  Service: Orthopedics;  Laterality: Right;  Irrigation and Debridement Right Foot and Place Acell Xenograft  . Toe amputation  04/24/2012    great toe   right foot  . Amputation  04/24/2012    Procedure: AMPUTATION RAY;  Surgeon: Newt Minion, MD;  Location: [place];  Service: Orthopedics;  Laterality: Right;  Right foot 1st ray amputation  . Amputation Left 04/23/2013    Procedure: AMPUTATION RAY;  Surgeon: Newt Minion, MD;  Location: [place];  Service: Orthopedics;  Laterality: Left;   Left Foot 5th Ray Amputation  . Colonoscopy    . Amputation Left 11/21/2013    Procedure: AMPUTATION RAY;  Surgeon: Newt Minion, MD;  Location: [place];  Service: Orthopedics;  Laterality: Left;  Left Foot 1st & 2nd Ray Amputation  . Amputation Right 12/04/2013    Procedure: AMPUTATION BELOW KNEE;  Surgeon: Marianna Payment, MD;  Location: [place];  Service: Orthopedics;  Laterality: Right;  . Amputation Left 12/29/2013    Procedure: LEFT AMPUTATION BELOW KNEE;  Surgeon: Marianna Payment, MD;  Location: [place];  Service: Orthopedics;  Laterality: Left;    Family History  Problem Relation Age of Onset  . Diabetes Mother   . Stroke Brother   . Colon cancer Neg Hx     Social History:  reports that he has been smoking Cigarettes.  He started smoking about 31 years ago. He has a 15 pack-year smoking history. He has never used smokeless tobacco. He reports that he drinks about 3.0 - 3.6 oz of alcohol per week. He reports that he does not use illicit drugs.  Allergies: No Known Allergies  Medications: I have reviewed the patient's current medications.  Results for orders placed or performed during the hospital encounter of 04/14/15 (from the past 48 hour(s))  CBC with Differential     Status: None   Collection Time: 04/14/15 10:40 AM  Result Value Ref Range   WBC 7.4 4.0 - 10.5 K/uL   RBC 5.15 4.22 - 5.81 MIL/uL   Hemoglobin 15.1 13.0 - 17.0 g/dL   HCT 43.4 39.0 - 52.0 %   MCV 84.3 78.0 - 100.0 fL   MCH 29.3 26.0 - 34.0 pg   MCHC 34.8 30.0 - 36.0 g/dL   RDW 14.1 11.5 - 15.5 %   Platelets 297 150 - 400 K/uL   Neutrophils Relative % 66 %   Neutro Abs 4.9 1.7 - 7.7 K/uL   Lymphocytes Relative 22 %   Lymphs Abs 1.6 0.7 - 4.0 K/uL   Monocytes Relative 11 %   Monocytes Absolute 0.8 0.1 - 1.0 K/uL   Eosinophils Relative 1 %   Eosinophils Absolute 0.1 0.0 - 0.7 K/uL   Basophils Relative 0 %   Basophils Absolute 0.0 0.0 - 0.1 K/uL  I-stat Chem 8, ED     Status: Abnormal   Collection  Time: 04/14/15 10:46 AM  Result Value Ref Range   Sodium 134 (L) 135 - 145 mmol/L   Potassium 3.5 3.5 - 5.1 mmol/L   Chloride 94 (L) 101 - 111 mmol/L   BUN 7 6 - 20 mg/dL   Creatinine, Ser 0.70 0.61 - 1.24 mg/dL   Glucose, Bld 408 (H) 65 - 99 mg/dL   Calcium, Ion 1.16 1.12 - 1.23 mmol/L   TCO2 25 0 - 100 mmol/L   Hemoglobin 17.0 13.0 - 17.0 g/dL   HCT 50.0 39.0 - 52.0 %  Urine rapid drug screen (hosp performed)     Status: Abnormal   Collection Time: 04/14/15 11:40 AM  Result Value Ref Range   Opiates NONE DETECTED NONE DETECTED   Cocaine POSITIVE (A) NONE DETECTED   Benzodiazepines NONE DETECTED NONE DETECTED   Amphetamines NONE DETECTED NONE DETECTED   Tetrahydrocannabinol NONE DETECTED NONE DETECTED   Barbiturates NONE DETECTED NONE DETECTED    Comment:        DRUG SCREEN FOR MEDICAL PURPOSES ONLY.  IF CONFIRMATION IS NEEDED FOR ANY PURPOSE, NOTIFY LAB WITHIN 5 DAYS.        LOWEST DETECTABLE LIMITS FOR URINE DRUG SCREEN Drug Class       Cutoff (ng/mL) Amphetamine      1000 Barbiturate      200 Benzodiazepine   001 Tricyclics       749 Opiates          300 Cocaine          300 THC              50   CBG monitoring, ED     Status: Abnormal   Collection Time: 04/14/15  2:47 PM  Result Value Ref Range   Glucose-Capillary 277 (H) 65 - 99 mg/dL  Lipid panel     Status: Abnormal   Collection Time: 04/14/15  4:35 PM  Result Value Ref Range   Cholesterol 182 0 - 200 mg/dL   Triglycerides 151 (H) <150 mg/dL   HDL 38 (L) >40 mg/dL   Total CHOL/HDL Ratio 4.8 RATIO   VLDL 30 0 - 40 mg/dL   LDL Cholesterol 114 (H) 0 - 99 mg/dL    Comment:        Total Cholesterol/HDL:CHD Risk Coronary Heart Disease Risk Table                     Men   Women  1/2 Average Risk   3.4  3.3  Average Risk       5.0   4.4  2 X Average Risk   9.6   7.1  3 X Average Risk  23.4   11.0        Use the calculated Patient Ratio above and the CHD Risk Table to determine the patient's CHD Risk.         ATP III CLASSIFICATION (LDL):  <100     mg/dL   Optimal  100-129  mg/dL   Near or Above                    Optimal  130-159  mg/dL   Borderline  160-189  mg/dL   High  >190     mg/dL   Very High     No results found.  Review of Systems  All other systems reviewed and are negative.  Blood pressure 159/93, pulse 70, temperature 98.6 F (37 C), temperature source Oral, resp. rate 18, SpO2 98 %. Physical Exam On examination patient has a intact left transtibial amputation. Examination of his right leg the entire posterior aspect of his right transtibial amputation is dry and gangrenous. There is no cellulitis no purulence no odor no signs of infection. Assessment/Plan: Assessment: Dry gangrene involving the entire posterior aspect of the residual limb right transtibial amputation.  Plan: Discussed with the patient is best option to get him back up on his feet would be to proceed with an above-the-knee amputation. Patient states he understands would like to proceed with surgery he states he like to proceed with surgery as soon as possible we will set this up for tomorrow around 5:00 PM on Thursday.  Shakisha Abend V 04/14/2015, 6:03 PM
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
## 2015-04-14 NOTE — Progress Notes (Signed)
Seen and examined.  Discussed with residents.  We have a plan.  Will co sign the H and PE when available.  Briefly, Mr. Hoar has stump gangrene.  He complains of right stump pain.  The stump has two separate black areas.  Fortunately, no redness or drainage - dry gangrene.   Sadly, Mr. Halbig has been non compliant with medications and is cocaine + on drug screen.  ("It was only one time.")  Ortho will see him - I suspect he will need to convert Rt BKA to Rt AKA.  Will will control blood sugar and manage other med problems.  He has been off his anticoag for a fib for a while.  We will continue to hold until we know if/when surgery is planned.

## 2015-04-14 NOTE — H&P (Signed)
[facility] Admission History and Physical Service Pager: [phone]  Patient name: Steven [name] Medical record number: [id number] Date of birth: 1961/01/25 Age: 54 y.o. Gender: male  Primary Care Provider: Chrisandra Netters, MD Consultants: Orthopedics  Code Status: Full (confirmed on admission)  Chief Complaint: Right knee stump pain   Assessment and Plan: Steven [name] is a 54 y.o. male presenting with right knee stump pain, worsening over 3 weeks with chronic ulceration and eschar, suggestive of dry gangrene. PMH is significant for Afib (on xarelto) DM-2 (poorly controlled, complicated peripheral neuropathy), PVD, HTN, HLD, Tobacco/Cocaine Abuse,   Dry Gangrene, Right stump (s/p R-BKA), in setting of PVD: Patient with hx of right BKA on 12/04/2013 due to gangrene. Currently patient with evolving (over 3-4 weeks) Eschar on lower right stump, no signs of local infection, no drainage. Patient afebrile, not tachy, WBC wnl, without any patient hx of systemic infection. However, there is concern for vascular compromise as patient's limb is cool to the touch (only at bottom/tip of stump). Most recently seen on 10/18 by vascular surgery and found to have adequate blood supply to right lower limb with collaterals, despite occulusion of superficial femoral and popliteal artery. Likewise patient had a MRI on 10/18 which was positive for mild soft issue inflammation, and negative for osteomyelitis or abscess. Patient was treated with vancomycin in the ED and provided with course of Keflex at that time for concern of cellulitis. Patient has now returned with continued pain and worsening eschar/ulcer.    - Admit to Med-Surg, Attending Dr. Andria Frames  - Follow up orthopedic recommendations (anticipate possible conversion R-BKA to R-AKA) - Will continue to monitor  - Will provide pain control with percocet - Ultimately will need PT/OT eval (likely post-op) and may benefit from SNF,  concerns with patient returning home due to non-adherence to medical therapy previously  Paroxysmal Atrial fibrillation: CHADs-VASC score 3  - Diltiazem 120mg  daily, avoiding BB due to hx of cocaine use  - Holding Xarelto pending possibly surgery per orthopedics (patient has not been compliant with Xarelto, has not refilled since 12/2014, does not need heparin bridge, will resume Xarelto if surgery will be several days away)   PVD: ABI in 2014   Chronic known PVD, s/p bilateral BKAs for gangrene. Multiple contributing factors with uncontrolled DM2, active smoker with tobacco abuse, active use of cocaine  T2DM, uncontrolled: Hb A1c 11.2. Home Diabetes regiment includes Lantus 45 units and Novolog 10-15 units TID with meals. Per patient, he does not take his insulin regiment consistently, has not taken Lantus in last week.  - Admission CBG of 408  - Continue Lantus at 30 units (reduced home dose for now) - SSI moderate  - CBGs ACQHS   HTN BP 159/93. Not compliant with blood pressure medication - Will restart home lisinopril, SCr 0.7  Bilateral BKA: Right BKA performed by Dr. 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 - Will continue to monitor   HLD  - Continue Lipitor 40 mg  - Will get lipid panel, last in 2012   Cocaine/Tobacco abuse : UDS positive cocaine, states only uses Cocaine intermittently. Smokes about half pack a day  - Nicotine patch    EtOH Abuse Hx: Patient denies using any EtOH recently  - CIWA protocol for 24 hours to monitor patient, can discontinue if stable scores  FEN/GI: PPI, carb modified diet  Prophylaxis: Lovenox SQ  Disposition: Med-surg,   History of Present Illness:  Steven  A [name] is a 54 y.o. male presenting with right leg pain. Per patient about a month ago patient had pain and swelling of right lower BKA. Patient went to emergency room on 10/18 where he received antibiotics for treatment of possibly infection. Patient completed his  antibiotics, stated that he took the full course. Patient then went home and stated that he began using a heating pad on his leg to help with the pain. He states that after that his lower limb developed a blister. This bllister then burst, there was no fluid or pus at that time. Afterwards, patient limb developed a eschar which then hardened. He states that he continues to have 8/9 out 10 pain. He states that the pain is intermittent, and with sharp radiation up his leg at times. Patient also indicated that his leg feels cooler then it normally does. Patient denies any fever, chills, n/v, or fatigue   Review Of Systems: Per HPI with the following additions: None  Otherwise the remainder of the systems were negative.  Patient Active Problem List   Diagnosis Date Noted  . Right leg pain 04/14/2015  . Cough 08/12/2014  . Bilateral hand pain 05/29/2014  . Acne 03/30/2014  . S/P bilateral BKA (below knee amputation) ([place]) 01/22/2014  . Cocaine abuse 12/05/2013  . Tobacco abuse 12/04/2013  . Peripheral vascular disease ([place])   . Atrial fibrillation ([employer]) 12/03/2013  . 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 History of colonic polyps 05/29/2013  . Diabetic retinopathy ([place]) 12/25/2012  . HLD (hyperlipidemia) 12/08/2012  . Peripheral neuropathy ([place]) 11/19/2011  . ETOH abuse 03/29/2011  . HTN (hypertension) 12/22/2010  . Diabetes mellitus type II, uncontrolled ([place]) 07/18/2006  . VENOUS INSUFFICIENCY 07/18/2006    Past Medical History: Past Medical History  Diagnosis Date  . Uncontrolled diabetes mellitus ([place])   . HTN (hypertension)   . History of DVT (deep vein thrombosis)   . Personal history of diabetic foot ulcer   . Peripheral vascular disease ([place])     a. Abnl ABIs 2014.  Marland Kitchen Peripheral neuropathy ([place]) 11/19/2011  . Varicose vein     legs  . Osteomyelitis of foot, right, acute ([place]) 08/30/2012  . Personal history of colonic adenomas 05/29/2013  . History of cocaine use   . PAF  (paroxysmal atrial fibrillation) ([place])     a. Dx 11/2013 in setting of sepsis/foot infection.  . Tobacco abuse   . H/O ETOH abuse   . Dysrhythmia     afib last admission  . Seasonal allergies     Past Surgical History: Past Surgical History  Procedure Laterality Date  . Amputation  04/21/2011    Procedure: AMPUTATION RAY;  Surgeon: Newt Minion, MD;  Location: [facility];  Service: Orthopedics;  Laterality: Right;  Rt foot 2nd ray ampt  . I&d extremity  05/03/2011    Procedure: IRRIGATION AND DEBRIDEMENT EXTREMITY;  Surgeon: Newt Minion, MD;  Location: [facility];  Service: Orthopedics;  Laterality: Right;  Irrigation and Debridement Right Foot and Place Acell Xenograft  . Toe amputation  04/24/2012    great toe   right foot  . Amputation  04/24/2012    Procedure: AMPUTATION RAY;  Surgeon: Newt Minion, MD;  Location: [facility];  Service: Orthopedics;  Laterality: Right;  Right foot 1st ray amputation  . Amputation Left 04/23/2013    Procedure: AMPUTATION RAY;  Surgeon: Newt Minion, MD;  Location: [facility];  Service: Orthopedics;  Laterality: Left;  Left Foot 5th Ray  Amputation  . Colonoscopy    . Amputation Left 11/21/2013    Procedure: AMPUTATION RAY;  Surgeon: Newt Minion, MD;  Location: [facility];  Service: Orthopedics;  Laterality: Left;  Left Foot 1st & 2nd Ray Amputation  . Amputation Right 12/04/2013    Procedure: AMPUTATION BELOW KNEE;  Surgeon: Marianna Payment, MD;  Location: [facility];  Service: Orthopedics;  Laterality: Right;  . Amputation Left 12/29/2013    Procedure: LEFT AMPUTATION BELOW KNEE;  Surgeon: Marianna Payment, MD;  Location: [facility];  Service: Orthopedics;  Laterality: Left;    Social History: Social History  Substance Use Topics  . Smoking status: Current Every Day Smoker -- 0.50 packs/day for 30 years    Types: Cigarettes    Start date: 06/13/1983  . Smokeless tobacco: Never Used  . Alcohol Use: 3.0 - 3.6 oz/week    5-6 Standard drinks or equivalent per week      Comment: t states he hs rank any alcholol rcently.  drinks off and on   Additional social history: patient smokes a half pack of cigarettes daily, does cocaine once in a while  Please also refer to relevant sections of EMR.  Family History: Family History  Problem Relation Age of Onset  . Diabetes Mother   . Stroke Brother   . Colon cancer Neg Hx     Allergies and Medications: No Known Allergies No current facility-administered medications on file prior to encounter.   Current Outpatient Prescriptions on File Prior to Encounter  Medication Sig Dispense Refill  . acetaminophen (TYLENOL) 325 MG tablet Take 2 tablets (650 mg total) by mouth every 6 (six) hours as needed. 60 tablet 1  . diltiazem (CARDIZEM CD) 120 MG 24 hr capsule Take 120 mg by mouth daily.    . insulin aspart (NOVOLOG) 100 UNIT/ML injection Inject 10-15 Units into the skin daily before supper. 1 vial PRN  . insulin glargine (LANTUS) 100 UNIT/ML injection Inject 0.45 mLs (45 Units total) into the skin daily before breakfast. 10 mL 6  . lisinopril (PRINIVIL,ZESTRIL) 20 MG tablet Take 1 tablet (20 mg total) by mouth daily. For blood pressure 90 tablet 0  . rivaroxaban (XARELTO) 20 MG TABS tablet Take 1 tablet (20 mg total) by mouth daily with supper. For Atrial Fibrillation 90 tablet 1  . atorvastatin (LIPITOR) 40 MG tablet Take 1 tablet (40 mg total) by mouth daily. (Patient not taking: Reported on 04/14/2015) 30 tablet 11  . fluticasone (FLONASE) 50 MCG/ACT nasal spray Place 2 sprays into both nostrils daily. (Patient not taking: Reported on 94/12/6544) 16 g 1  . folic acid (FOLVITE) 1 MG tablet Take 1 tablet (1 mg total) by mouth daily. (Patient not taking: Reported on 04/14/2015) 30 tablet 1  . gabapentin (NEURONTIN) 300 MG capsule Take 300mg  every morning, 300mg  every day at noon, and 600mg  every night at bedtime. (Patient not taking: Reported on 04/14/2015) 120 capsule 5  . nitroGLYCERIN (NITRODUR - DOSED IN MG/24 HR) 0.2  mg/hr patch Place 1 patch (0.2 mg total) onto the skin daily. (Patient not taking: Reported on 04/14/2015) 30 patch 5  . oxyCODONE-acetaminophen (PERCOCET) 5-325 MG tablet Take 1-2 tablets by mouth every 4 (four) hours as needed. (Patient not taking: Reported on 04/14/2015) 20 tablet 0  . pantoprazole (PROTONIX) 40 MG tablet Take 1 tablet (40 mg total) by mouth at bedtime. (Patient not taking: Reported on 04/14/2015) 90 tablet 1    Objective: BP 159/93 mmHg  Pulse 70  Temp(Src) 98.6 F (37 C) (Oral)  Resp 18  SpO2 98% Exam: General: Older gentleman, lying in bed, cooperative, NAD Eyes: Pupils Equal Round and pinpoint, Extraocular movements intact, Conjunctiva without redness or discharge ENTM: Moist mucosa membranes  Neck: no cervical lymphadenopathy  Cardiovascular: RRR, no murmurs, rubs or gallops, 2 + radial pulses. Trace palpable pulse in right BKA popliteal Respiratory: CTAB, no wheezes, rhonchi Abdomen: BS+, no ttp, no rebound  MSK: Patient with bilateral BKAs. Muscle strength intact 5/5 hip flexors able to lift both stumps. Right leg with extensive firm dark dry eschar circular lesion on posterior aspect of knee and additional separate area of eschar on medial aspect, tip of stump area cool to the touch slightly > then left leg. Right leg slightly tender to palpation. No erythema noted, no edema, no drainage or bleeding. Skin:  no sores or suspicious lesions or rashes or color changes Neuro: Alert and orientated x 3   Labs and Imaging: CBC BMET   Recent Labs Lab 04/14/15 1040 04/14/15 1046  WBC 7.4  --   HGB 15.1 17.0  HCT 43.4 50.0  PLT 297  --     Recent Labs Lab 04/14/15 1046  NA 134*  K 3.5  CL 94*  BUN 7  CREATININE 0.70  GLUCOSE 408*     Hgb A1c 11.2  Lipids - total cholesterol 182, TG 151, HDL 38, LDL 114  UDS - positive cocaine  Last imaging Right Lower Extremity (BKA Stump) on 03/30/15 IMPRESSION: 1. Subtle edema and very low grade infiltrative  enhancement along the distal margin of the tibia and fibula on the right appears asymmetric and likely represents mild soft tissue inflammation. This could certainly reflect cellulitis. No gas observed in the soft tissues. Tiny amount of fluid tracks around the distal margin of the right tibia anterolaterally, slightly more prominent than on the left, but without enhancing margins to suggest infection/abscess. No osteomyelitis. 2. Osteoarthritis of both knees  Asiyah Cletis Media, MD 04/14/2015, 5:15 PM PGY-1, [facility] Intern pager: [phone], text pages welcome  Upper Level Addendum:  I have seen and evaluated this patient along with Dr. Emmaline Life and reviewed the above note, making necessary revisions in purple.  [name], [place], PGY-3

## 2015-04-14 NOTE — Progress Notes (Signed)
Inpatient Diabetes Program Recommendations  AACE/ADA: New Consensus Statement on Inpatient Glycemic Control (2015)  Target Ranges:  Prepandial:   less than 140 mg/dL      Peak postprandial:   less than 180 mg/dL (1-2 hours)      Critically ill patients:  140 - 180 mg/dL   Review of Glycemic Control  Diabetes history: DM2 Outpatient Diabetes medications: Lantus 45 units QAM, Novolog 10-15 units ac supper meal Current orders for Inpatient glycemic control: Lantus 30 units QHS, Novolog moderate tidwc and hs  Results for TACUMA, GRAFFAM (MRN [id number]) as of 04/14/2015 16:27  Ref. Range 04/14/2015 10:46  Sodium Latest Ref Range: 135-145 mmol/L 134 (L)  Potassium Latest Ref Range: 3.5-5.1 mmol/L 3.5  Chloride Latest Ref Range: 101-111 mmol/L 94 (L)  BUN Latest Ref Range: 6-20 mg/dL 7  Creatinine Latest Ref Range: 0.61-1.24 mg/dL 0.70  Glucose Latest Ref Range: 65-99 mg/dL 408 (H)  Results for CHEYENNE, SCHUMM (MRN [id number]) as of 04/14/2015 16:27  Ref. Range 04/14/2015 10:46  Glucose Latest Ref Range: 65-99 mg/dL 408 (H)  Results for CHANCELLOR, VANDERLOOP (MRN [id number]) as of 04/14/2015 16:27  Ref. Range 04/14/2015 09:35  Hemoglobin A1C Unknown 11.2  Uncontrolled DM. + cocaine.  Will need OP Diabetes education for assistance with glucose control.  Inpatient Diabetes Program Recommendations:  Insulin - Basal: Increase Lantus to 35 units QHS Insulin - Meal Coverage: Add Novolog 4 units tidwc for meal coverage insulin if pt eats > 50% meal HgbA1C: 11.2% uncontrolled Diet: CHO mod med   Will follow. Thank you. Lorenda Peck, RD, LDN, CDE Inpatient Diabetes Coordinator [phone]

## 2015-04-14 NOTE — Progress Notes (Signed)
Date of Visit: 04/14/2015   HPI:  Patient presents for follow up of his RLE pain. Has had prior bilateral BKA. Seen in ED on 10/18 with complaint of R stump pain. Had vascular studies done which showed occlusion of R mid to distal femoral artery and R popliteal artery. There was some question of a chronic DVT on the ultrasound doppler, but the final report states NO evidence of DVT.  Patient has had continued worsening of pain since that ED visit. Now has area on his stump that is dark and black. No fevers, decreased PO intake, or nausea/vomiting. States he plans to go to ED right after this visit to have his leg checked out.   ROS: See HPI.  [place]: history of afib, type 2 diabetes, cocaine use, diabetes poorly controlled, hyperlipidemia, hypertension, bilateral BKA  PHYSICAL EXAM: BP 155/92 mmHg  Pulse 78  Temp(Src) 98.7 F (37.1 C) (Oral)  Wt  Gen: NAD, well appearing systemically HEENT: normocephalic, atraumatic Extremity: s/p bilateral BKA. R stump has has two areas of blackened blisterlike skin that are nontender to palpation. No weeping, erythema, or drainage. R stump is cold to touch.   ASSESSMENT/PLAN:  54 yo male with known vasculopathy presenting with R stump pain, blackened skin, and coolness, progressive over the last several weeks. Systemically well, without signs of infection. Presentation is concerning for chronic vascular insufficiency resulting in dry gangrene. - recommend going to ED for evaluation, suspect will need surgical intervention - patient was wheeled to ED by our staff for further evaluation  [place]. [name], [place]

## 2015-04-14 NOTE — ED Provider Notes (Signed)
Complains of right leg pain and swelling at site of BKA stump for possibly the past 1 month.. He also has a wound at right leg BKA stump for the past 3 weeks.. Denies fever denies vomiting. He states he ran out of pain medicine several days ago. On exam patient is alert Glasgow Coma Score 15 right lower extremity with BKA distal stump has a scabbed lesion prostate 8 cm x 2 cm. Distal stump is cold. Femoral pulse 2+. Left lower extremity femoral pulses 2+. BKA. Distal stump is warm. Concern for vascular insufficiency  Orlie Dakin, MD 04/14/15 1127

## 2015-04-14 NOTE — ED Provider Notes (Signed)
CSN: [id number]     Arrival date & time 04/14/15  7262 History   First MD Initiated Contact with Patient 04/14/15 1015     Chief Complaint  Patient presents with  . Leg Injury   (Consider location/radiation/quality/duration/timing/severity/associated sxs/prior Treatment) HPI Comments: Steven [name] returns today for evaluation of right below knee stump pain and ulcers.  He reports he was previously seen in the ED for this month ago and was started on antibiotics for cellulitis.  He completed his antibiotics; However, the the ulcers continue to get larger and he has continued pain.  Reports pain is sharp, shooting, radiating "to his head".  He lies in bed most days without much moving; he has prosthesis, but has not worn them for several weeks due to the ulcers and pain.  He reports numbness and tingling in bilateral legs and says he "only feels pain".  He denies any fevers, chills.   Patient is a 54 y.o. male presenting with leg pain. The history is provided by the patient.  Leg Pain Location:  Leg Time since incident:  3 weeks Injury: no   Leg location:  R leg Pain details:    Quality:  Shooting and sharp   Radiates to:  Does not radiate   Severity:  Moderate   Duration:  3 weeks   Timing:  Constant   Progression:  Unchanged Chronicity:  New (3-4 weeks) Prior injury to area:  No Relieved by:  Nothing Worsened by:  Nothing tried Ineffective treatments:  None tried Associated symptoms: numbness, swelling and tingling   Associated symptoms: no back pain, no decreased ROM and no fever     Past Medical History  Diagnosis Date  . Uncontrolled diabetes mellitus ([place])   . HTN (hypertension)   . History of DVT (deep vein thrombosis)   . Personal history of diabetic foot ulcer   . Peripheral vascular disease ([place])     a. Abnl ABIs 2014.  Marland Kitchen Peripheral neuropathy ([place]) 11/19/2011  . Varicose vein     legs  . Osteomyelitis of foot, right, acute ([place]) 08/30/2012  . Personal history of  colonic adenomas 05/29/2013  . History of cocaine use   . PAF (paroxysmal atrial fibrillation) ([place])     a. Dx 11/2013 in setting of sepsis/foot infection.  . Tobacco abuse   . H/O ETOH abuse   . Dysrhythmia     afib last admission  . Seasonal allergies    Past Surgical History  Procedure Laterality Date  . Amputation  04/21/2011    Procedure: AMPUTATION RAY;  Surgeon: Newt Minion, MD;  Location: [facility];  Service: Orthopedics;  Laterality: Right;  Rt foot 2nd ray ampt  . I&d extremity  05/03/2011    Procedure: IRRIGATION AND DEBRIDEMENT EXTREMITY;  Surgeon: Newt Minion, MD;  Location: [facility];  Service: Orthopedics;  Laterality: Right;  Irrigation and Debridement Right Foot and Place Acell Xenograft  . Toe amputation  04/24/2012    great toe   right foot  . Amputation  04/24/2012    Procedure: AMPUTATION RAY;  Surgeon: Newt Minion, MD;  Location: [facility];  Service: Orthopedics;  Laterality: Right;  Right foot 1st ray amputation  . Amputation Left 04/23/2013    Procedure: AMPUTATION RAY;  Surgeon: Newt Minion, MD;  Location: [facility];  Service: Orthopedics;  Laterality: Left;  Left Foot 5th Ray Amputation  . Colonoscopy    . Amputation Left 11/21/2013    Procedure: AMPUTATION RAY;  Surgeon:  Newt Minion, MD;  Location: [facility];  Service: Orthopedics;  Laterality: Left;  Left Foot 1st & 2nd Ray Amputation  . Amputation Right 12/04/2013    Procedure: AMPUTATION BELOW KNEE;  Surgeon: Marianna Payment, MD;  Location: [facility];  Service: Orthopedics;  Laterality: Right;  . Amputation Left 12/29/2013    Procedure: LEFT AMPUTATION BELOW KNEE;  Surgeon: Marianna Payment, MD;  Location: [facility];  Service: Orthopedics;  Laterality: Left;   Family History  Problem Relation Age of Onset  . Diabetes Mother   . Stroke Brother   . Colon cancer Neg Hx    Social History  Substance Use Topics  . Smoking status: Current Every Day Smoker -- 0.50 packs/day for 30 years    Types: Cigarettes    Start date:  06/13/1983  . Smokeless tobacco: Never Used  . Alcohol Use: 3.0 - 3.6 oz/week    5-6 Standard drinks or equivalent per week     Comment: t states he hs rank any alcholol rcently.  drinks off and on    Review of Systems  Constitutional: Negative for fever, chills and appetite change.  HENT: Negative.   Respiratory: Negative for cough, chest tightness and shortness of breath.   Cardiovascular: Negative for chest pain and palpitations.  Gastrointestinal: Negative.   Musculoskeletal: Negative for back pain.  Neurological: Negative for dizziness and weakness.      Allergies  Review of patient's allergies indicates no known allergies.  Home Medications   Prior to Admission medications   Medication Sig Start Date End Date Taking? Authorizing Provider  acetaminophen (TYLENOL) 325 MG tablet Take 2 tablets (650 mg total) by mouth every 6 (six) hours as needed. 06/22/14  Yes Leeanne Rio, MD  aspirin 325 MG tablet Take 325 mg by mouth daily.   Yes Historical Provider, MD  cetirizine (ZYRTEC) 10 MG tablet Take 10 mg by mouth daily. 01/28/15  Yes Historical Provider, MD  diltiazem (CARDIZEM CD) 120 MG 24 hr capsule Take 120 mg by mouth daily.   Yes Historical Provider, MD  insulin aspart (NOVOLOG) 100 UNIT/ML injection Inject 10-15 Units into the skin daily before supper. 11/13/14  Yes Zenia Resides, MD  insulin glargine (LANTUS) 100 UNIT/ML injection Inject 0.45 mLs (45 Units total) into the skin daily before breakfast. 11/12/14  Yes Zenia Resides, MD  lisinopril (PRINIVIL,ZESTRIL) 20 MG tablet Take 1 tablet (20 mg total) by mouth daily. For blood pressure 01/29/15  Yes Alveda Reasons, MD  rivaroxaban (XARELTO) 20 MG TABS tablet Take 1 tablet (20 mg total) by mouth daily with supper. For Atrial Fibrillation 06/22/14  Yes Leeanne Rio, MD  atorvastatin (LIPITOR) 40 MG tablet Take 1 tablet (40 mg total) by mouth daily. Patient not taking: Reported on 04/14/2015 03/31/14   Leeanne Rio, MD  cephALEXin (KEFLEX) 500 MG capsule Take 1 capsule (500 mg total) by mouth 3 (three) times daily. Patient not taking: Reported on 04/14/2015 03/30/15   Margarita Mail, PA-C  fluticasone [facility]) 50 MCG/ACT nasal spray Place 2 sprays into both nostrils daily. Patient not taking: Reported on 04/14/2015 09/09/14   Leeanne Rio, MD  folic acid (FOLVITE) 1 MG tablet Take 1 tablet (1 mg total) by mouth daily. Patient not taking: Reported on 04/14/2015 12/18/13   Ivan Anchors Love, PA-C  gabapentin (NEURONTIN) 300 MG capsule Take 300mg  every morning, 300mg  every day at noon, and 600mg  every night at bedtime. Patient not taking: Reported on 04/14/2015  05/20/14   Leeanne Rio, MD  nitroGLYCERIN (NITRODUR - DOSED IN MG/24 HR) 0.2 mg/hr patch Place 1 patch (0.2 mg total) onto the skin daily. Patient not taking: Reported on 04/14/2015 11/12/14   Zenia Resides, MD  oxyCODONE-acetaminophen (PERCOCET) 5-325 MG tablet Take 1-2 tablets by mouth every 4 (four) hours as needed. Patient not taking: Reported on 04/14/2015 03/30/15   Margarita Mail, PA-C  pantoprazole (PROTONIX) 40 MG tablet Take 1 tablet (40 mg total) by mouth at bedtime. Patient not taking: Reported on 04/14/2015 09/25/14   Leeanne Rio, MD   BP 159/93 mmHg  Pulse 70  Temp(Src) 98.6 F (37 C) (Oral)  Resp 18  SpO2 98% Physical Exam  Constitutional: He is oriented to person, place, and time. No distress.  Eyes: EOM are normal. Pupils are equal, round, and reactive to light.  Cardiovascular: Normal rate and normal heart sounds.   No murmur heard. Irregular rhythm   Pulmonary/Chest: Effort normal and breath sounds normal. No respiratory distress.  Abdominal: Soft. He exhibits no distension. There is no tenderness.  Musculoskeletal:  Bilateral below knee amputations  Neurological: He is alert and oriented to person, place, and time.  sleepy  Skin: Skin is warm. He is not diaphoretic.  Right below knee stump: Large ~  6cm x 6cm ulcer on posterior knee with dark overlying necrotic tissue; Large ulcer on medical knee with necrotic tissue without surrounding erythema. Stump is cold to touch below knee; Unable to appreciate popliteal pulse but has good right femoral pulse           ED Course  Procedures (including critical care time) Labs Review Labs Reviewed  URINE RAPID DRUG SCREEN, HOSP PERFORMED - Abnormal; Notable for the following:    Cocaine POSITIVE (*)    All other components within normal limits  I-STAT CHEM 8, ED - Abnormal; Notable for the following:    Sodium 134 (*)    Chloride 94 (*)    Glucose, Bld 408 (*)    All other components within normal limits  CBG MONITORING, ED - Abnormal; Notable for the following:    Glucose-Capillary 277 (*)    All other components within normal limits  CBC WITH DIFFERENTIAL/PLATELET    Imaging Review No results found. I have personally reviewed and evaluated these images and lab results as part of my medical decision-making.   EKG Interpretation None      MDM   Final diagnoses:  Dry gangrene (HCC)  Right leg pain  S/P bilateral BKA (below knee amputation) ([place])  Cocaine abuse    Steven [name] is a 54 year old gentle man with a history of uncontrolled diabetes, peripheral vascular disease, Hx of foot osteomyelitis resulting in bilateral BKAs in 2012 who presents to the ED with right stump pain and necrotic ulcers. He had no signs of infection: WBC was normal without recent fevers/chills. MRI and vascular studies from 10/18 was reviewed and his orthopedist was consulted due to his necrotic ulcers and cold stump who recommend admission and likely need for above knee amputation.   Olam Idler, MD 04/14/15 [place], MD 04/14/15 9935

## 2015-04-15 ENCOUNTER — Inpatient Hospital Stay (HOSPITAL_COMMUNITY): Payer: Medicaid Other | Admitting: Certified Registered Nurse Anesthetist

## 2015-04-15 ENCOUNTER — Encounter (HOSPITAL_COMMUNITY)
Admission: EM | Disposition: A | Discharge status: Rehabilitation Facility | Payer: Self-pay | Source: Home / Self Care | Attending: Family Medicine

## 2015-04-15 ENCOUNTER — Encounter (HOSPITAL_COMMUNITY): Payer: Self-pay | Admitting: Certified Registered Nurse Anesthetist

## 2015-04-15 DIAGNOSIS — I480 Paroxysmal atrial fibrillation: Secondary | ICD-10-CM

## 2015-04-15 DIAGNOSIS — G629 Polyneuropathy, unspecified: Secondary | ICD-10-CM

## 2015-04-15 HISTORY — PX: AMPUTATION: SHX166

## 2015-04-15 LAB — COMPREHENSIVE METABOLIC PANEL
ALBUMIN: 2.1 g/dL — AB (ref 3.5–5.0)
ALT: 19 U/L (ref 17–63)
ANION GAP: 8 (ref 5–15)
AST: 21 U/L (ref 15–41)
Alkaline Phosphatase: 80 U/L (ref 38–126)
BUN: 6 mg/dL (ref 6–20)
CO2: 28 mmol/L (ref 22–32)
Calcium: 8.9 mg/dL (ref 8.9–10.3)
Chloride: 100 mmol/L — ABNORMAL LOW (ref 101–111)
Creatinine, Ser: 0.7 mg/dL (ref 0.61–1.24)
GFR calc Af Amer: >60 mL/min (ref >60)
GFR calc non Af Amer: >60 mL/min (ref >60)
GLUCOSE: 149 mg/dL — AB (ref 65–99)
POTASSIUM: 3.4 mmol/L — AB (ref 3.5–5.1)
SODIUM: 136 mmol/L (ref 135–145)
Total Bilirubin: 0.2 mg/dL — ABNORMAL LOW (ref 0.3–1.2)
Total Protein: 6 g/dL — ABNORMAL LOW (ref 6.5–8.1)

## 2015-04-15 LAB — GLUCOSE, CAPILLARY
GLUCOSE-CAPILLARY: 118 mg/dL — AB (ref 65–99)
Glucose-Capillary: 148 mg/dL — ABNORMAL HIGH (ref 65–99)
Glucose-Capillary: 69 mg/dL (ref 65–99)
Glucose-Capillary: 90 mg/dL (ref 65–99)
Glucose-Capillary: 123 mg/dL — ABNORMAL HIGH (ref 65–99)

## 2015-04-15 SURGERY — AMPUTATION, ABOVE KNEE
Anesthesia: General | Laterality: Right

## 2015-04-15 MED ORDER — METHOCARBAMOL 500 MG PO TABS
500.0000 mg | ORAL_TABLET | Freq: Four times a day (QID) | ORAL | Status: DC | PRN
  Administered 2015-04-15 – 2015-04-19 (×3): 500 mg via ORAL
  Filled 2015-04-15 (×3): qty 1

## 2015-04-15 MED ORDER — HYDROMORPHONE HCL 1 MG/ML IJ SOLN
INTRAMUSCULAR | Status: AC
  Filled 2015-04-15: qty 1

## 2015-04-15 MED ORDER — MIDAZOLAM HCL 5 MG/5ML IJ SOLN
INTRAMUSCULAR | Status: DC | PRN
  Administered 2015-04-15 (×2): 1 mg via INTRAVENOUS

## 2015-04-15 MED ORDER — DEXTROSE 50 % IV SOLN
INTRAVENOUS | Status: AC
  Administered 2015-04-15: 25 mL via INTRAVENOUS
  Filled 2015-04-15: qty 50

## 2015-04-15 MED ORDER — 0.9 % SODIUM CHLORIDE (POUR BTL) OPTIME
TOPICAL | Status: DC | PRN
  Administered 2015-04-15: 1000 mL

## 2015-04-15 MED ORDER — LACTATED RINGERS IV SOLN
INTRAVENOUS | Status: DC
  Administered 2015-04-15 (×2): via INTRAVENOUS

## 2015-04-15 MED ORDER — PROPOFOL 10 MG/ML IV BOLUS
INTRAVENOUS | Status: DC | PRN
  Administered 2015-04-15: 200 mg via INTRAVENOUS

## 2015-04-15 MED ORDER — METOPROLOL TARTRATE 1 MG/ML IV SOLN: 2.5000 mg | INTRAVENOUS | Status: DC | PRN

## 2015-04-15 MED ORDER — METHOCARBAMOL 500 MG PO TABS
ORAL_TABLET | ORAL | Status: AC
  Filled 2015-04-15: qty 1

## 2015-04-15 MED ORDER — INSULIN GLARGINE 100 UNIT/ML ~~LOC~~ SOLN
30.0000 [IU] | SUBCUTANEOUS | Status: DC
  Administered 2015-04-16: 30 [IU] via SUBCUTANEOUS
  Filled 2015-04-15 (×2): qty 0.3

## 2015-04-15 MED ORDER — OXYCODONE HCL 5 MG PO TABS: 5.0000 mg | ORAL_TABLET | Freq: Once | ORAL | Status: AC | PRN

## 2015-04-15 MED ORDER — FENTANYL CITRATE (PF) 250 MCG/5ML IJ SOLN
INTRAMUSCULAR | Status: AC
  Filled 2015-04-15: qty 5

## 2015-04-15 MED ORDER — LIDOCAINE HCL (CARDIAC) 20 MG/ML IV SOLN
INTRAVENOUS | Status: DC | PRN
  Administered 2015-04-15: 60 mg via INTRAVENOUS

## 2015-04-15 MED ORDER — DEXTROSE 50 % IV SOLN
25.0000 mL | Freq: Once | INTRAVENOUS | Status: AC
  Administered 2015-04-15: 25 mL via INTRAVENOUS

## 2015-04-15 MED ORDER — FENTANYL CITRATE (PF) 100 MCG/2ML IJ SOLN
INTRAMUSCULAR | Status: DC | PRN
  Administered 2015-04-15 (×2): 50 ug via INTRAVENOUS
  Administered 2015-04-15: 25 ug via INTRAVENOUS
  Administered 2015-04-15: 50 ug via INTRAVENOUS
  Administered 2015-04-15: 25 ug via INTRAVENOUS
  Administered 2015-04-15 (×2): 50 ug via INTRAVENOUS

## 2015-04-15 MED ORDER — DEXTROSE 5 % IV SOLN
500.0000 mg | Freq: Four times a day (QID) | INTRAVENOUS | Status: DC | PRN
  Filled 2015-04-15: qty 5

## 2015-04-15 MED ORDER — PROPOFOL 10 MG/ML IV BOLUS
INTRAVENOUS | Status: AC
  Filled 2015-04-15: qty 20

## 2015-04-15 MED ORDER — HYDROMORPHONE HCL 1 MG/ML IJ SOLN
0.2500 mg | INTRAMUSCULAR | Status: DC | PRN
  Administered 2015-04-15 (×5): 0.5 mg via INTRAVENOUS

## 2015-04-15 MED ORDER — ACETAMINOPHEN 325 MG PO TABS
325.0000 mg | ORAL_TABLET | ORAL | Status: DC | PRN
Start: 2015-04-15 — End: 2015-04-15

## 2015-04-15 MED ORDER — ONDANSETRON HCL 4 MG/2ML IJ SOLN
INTRAMUSCULAR | Status: DC | PRN
  Administered 2015-04-15: 4 mg via INTRAVENOUS

## 2015-04-15 MED ORDER — OXYCODONE HCL 5 MG/5ML PO SOLN
5.0000 mg | Freq: Once | ORAL | Status: AC | PRN
  Administered 2015-04-15: 5 mg via ORAL

## 2015-04-15 MED ORDER — MORPHINE SULFATE (PF) 2 MG/ML IV SOLN
2.0000 mg | INTRAVENOUS | Status: DC | PRN
  Administered 2015-04-15 – 2015-04-16 (×5): 2 mg via INTRAVENOUS
  Filled 2015-04-15 (×5): qty 1

## 2015-04-15 MED ORDER — ACETAMINOPHEN 160 MG/5ML PO SOLN
325.0000 mg | ORAL | Status: DC | PRN
  Filled 2015-04-15: qty 20.3

## 2015-04-15 MED ORDER — INFLUENZA VAC SPLIT QUAD 0.5 ML IM SUSY
0.5000 mL | PREFILLED_SYRINGE | INTRAMUSCULAR | Status: AC
  Administered 2015-04-17: 0.5 mL via INTRAMUSCULAR
  Filled 2015-04-15: qty 0.5

## 2015-04-15 MED ORDER — MIDAZOLAM HCL 2 MG/2ML IJ SOLN
INTRAMUSCULAR | Status: AC
  Filled 2015-04-15: qty 4

## 2015-04-15 MED ORDER — OXYCODONE HCL 5 MG/5ML PO SOLN
ORAL | Status: AC
  Filled 2015-04-15: qty 5

## 2015-04-15 SURGICAL SUPPLY — 46 items
BLADE SAW RECIP 87.9 MT (BLADE) ×3 IMPLANT
BNDG COHESIVE 6X5 TAN STRL LF (GAUZE/BANDAGES/DRESSINGS) ×6 IMPLANT
BNDG GAUZE ELAST 4 BULKY (GAUZE/BANDAGES/DRESSINGS) ×3 IMPLANT
COVER SURGICAL LIGHT HANDLE (MISCELLANEOUS) ×6 IMPLANT
CUFF TOURNIQUET SINGLE 34IN LL (TOURNIQUET CUFF) IMPLANT
CUFF TOURNIQUET SINGLE 44IN (TOURNIQUET CUFF) IMPLANT
DRAIN PENROSE 1/2X12 LTX STRL (WOUND CARE) IMPLANT
DRAPE EXTREMITY T 121X128X90 (DRAPE) ×3 IMPLANT
DRAPE PROXIMA HALF (DRAPES) ×3 IMPLANT
DRAPE U-SHAPE 47X51 STRL (DRAPES) ×6 IMPLANT
DRSG ADAPTIC 3X8 NADH LF (GAUZE/BANDAGES/DRESSINGS) ×3 IMPLANT
DRSG AQUACEL AG ADV 3.5X14 (GAUZE/BANDAGES/DRESSINGS) ×3 IMPLANT
DRSG PAD ABDOMINAL 8X10 ST (GAUZE/BANDAGES/DRESSINGS) ×6 IMPLANT
DURAPREP 26ML APPLICATOR (WOUND CARE) ×3 IMPLANT
ELECT CAUTERY BLADE 6.4 (BLADE) IMPLANT
ELECT REM PT RETURN 9FT ADLT (ELECTROSURGICAL) ×3
ELECTRODE REM PT RTRN 9FT ADLT (ELECTROSURGICAL) ×1 IMPLANT
EVACUATOR 1/8 PVC DRAIN (DRAIN) IMPLANT
GAUZE SPONGE 4X4 12PLY STRL (GAUZE/BANDAGES/DRESSINGS) ×3 IMPLANT
GLOVE BIOGEL PI IND STRL 6.5 (GLOVE) ×1 IMPLANT
GLOVE BIOGEL PI IND STRL 9 (GLOVE) ×1 IMPLANT
GLOVE BIOGEL PI INDICATOR 6.5 (GLOVE) ×2
GLOVE BIOGEL PI INDICATOR 9 (GLOVE) ×2
GLOVE SURG ORTHO 9.0 STRL STRW (GLOVE) ×3 IMPLANT
GLOVE SURG SS PI 6.5 STRL IVOR (GLOVE) ×3 IMPLANT
GOWN STRL REUS W/ TWL XL LVL3 (GOWN DISPOSABLE) ×2 IMPLANT
GOWN STRL REUS W/TWL XL LVL3 (GOWN DISPOSABLE) ×4
KIT BASIN OR (CUSTOM PROCEDURE TRAY) ×3 IMPLANT
KIT ROOM TURNOVER OR (KITS) ×3 IMPLANT
MANIFOLD NEPTUNE II (INSTRUMENTS) ×3 IMPLANT
NS IRRIG 1000ML POUR BTL (IV SOLUTION) ×3 IMPLANT
PACK GENERAL/GYN (CUSTOM PROCEDURE TRAY) ×3 IMPLANT
PAD ARMBOARD 7.5X6 YLW CONV (MISCELLANEOUS) ×3 IMPLANT
STAPLER VISISTAT 35W (STAPLE) IMPLANT
STOCKINETTE IMPERVIOUS LG (DRAPES) IMPLANT
SUT ETHILON 2 0 PSLX (SUTURE) ×6 IMPLANT
SUT PDS AB 1 CT  36 (SUTURE)
SUT PDS AB 1 CT 36 (SUTURE) IMPLANT
SUT SILK 2 0 (SUTURE) ×2
SUT SILK 2-0 18XBRD TIE 12 (SUTURE) ×1 IMPLANT
SUT VIC AB 1 CTX 27 (SUTURE) ×6 IMPLANT
SWAB COLLECTION DEVICE MRSA (MISCELLANEOUS) IMPLANT
TOWEL OR 17X24 6PK STRL BLUE (TOWEL DISPOSABLE) ×3 IMPLANT
TOWEL OR 17X26 10 PK STRL BLUE (TOWEL DISPOSABLE) ×3 IMPLANT
TUBE ANAEROBIC SPECIMEN COL (MISCELLANEOUS) IMPLANT
WATER STERILE IRR 1000ML POUR (IV SOLUTION) ×3 IMPLANT

## 2015-04-15 NOTE — Progress Notes (Signed)
Family Medicine Teaching Service Daily Progress Note Intern Pager: [phone]  Patient name: Steven [name] Medical record number: [id number] Date of birth: 1960/12/12 Age: 54 y.o. Gender: male  Primary Care Provider: Chrisandra Netters, MD Consultants: Orthopedics  Code Status: Full   Pt Overview and Major Events to Date:   Assessment and Plan: CURTEZ BRALLIER is a 54 y.o. male presenting with right knee stump pain, worsening over 3 weeks with chronic ulceration and eschar, suggestive of dry gangrene. PMH is significant for Afib (on xarelto) DM-2 (poorly controlled, complicated peripheral neuropathy), PVD, HTN, HLD, Tobacco/Cocaine Abuse,   Dry Gangrene, Right stump (s/p R-BKA), in setting of PVD: Patient afebrile, HR 85.  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 Currently patient with evolving (over 3-4 weeks) Eschar on lower right stump, no signs of local infection, no drainage.Most recently seen on 10/18 by vascular surgery and found to have adequate blood supply to right lower limb with collaterals, despite occulusion of superficial femoral and popliteal artery. Likewise patient had a MRI on 10/18 which was positive for mild soft issue inflammation, and negative for osteomyelitis or abscess. Patient was treated with vancomycin in the ED and provided with course of Keflex at that time for concern of cellulitis. Patient has now returned with continued pain and worsening eschar/ulcer.  -  Orthopedics to do an AKA at 5 PM today.   -  Morphine 2 IV every q6hrs  - Ultimately will need PT/OT eval (likely post-op) and may benefit from SNF, concerns with patient returning home due to non-adherence to medical therapy previously  Paroxysmal Atrial fibrillation: CHADs-VASC score 3  - Diltiazem 120mg  daily, avoiding BB due to hx of cocaine use  - Holding Xarelto pending possibly surgery per orthopedics  PVD: ABI in 2014. Chronic known PVD, s/p bilateral BKAs for gangrene. Multiple  contributing factors with uncontrolled DM2, active smoker with tobacco abuse, active use of cocaine  T2DM, uncontrolled: Hb A1c 11.2. Home Diabetes regiment includes Lantus 45 units and Novolog 10-15 units TID with meals. Per patient, he does not take his insulin regiment consistently, has not taken Lantus in last week. dmission CBG of 408  - Current CBGs 149 and 148 - Continue Lantus at 30 units (reduced home dose for now) - SSI moderate  - CBGs ACQHS   HTN BP 159/93. Not compliant with blood pressure medication - Will restart home lisinopril, SCr 0.7  Bilateral BKA: Right BKA performed by Dr. 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 - Will continue to monitor   HLD  - Continue Lipitor 40 mg  - Will get lipid panel, last in 2012   Cocaine/Tobacco abuse : UDS positive cocaine, states only uses Cocaine intermittently. Smokes about half pack a day  - Nicotine patch   EtOH Abuse Hx: Patient denies using any EtOH recently  - CIWA protocol for 24 hours to monitor patient, can discontinue if stable scores  FEN/GI: PPI, carb modified diet  Prophylaxis: Lovenox SQ  Disposition: Med-surg   Subjective:  Patient doing well this morning without any issues. No chest pain, n/v, no fever, or chills.   Objective: Temp:  [97.7 F (36.5 C)-99.3 F (37.4 C)] 98.3 F (36.8 C) (11/03 0800) Pulse Rate:  [58-85] 85 (11/03 0800) Resp:  [14-18] 18 (11/03 0433) BP: (105-159)/(79-100) 134/84 mmHg (11/03 0800) SpO2:  [94 %-99 %] 97 % (11/03 0800) Physical Exam: General: Patient sitting up in bed, NAD  Cardiovascular: RRR, no murmurs,  gallops, rubs  Respiratory: CTAB, no wheezing, rhonchi  Abdomen: BS+, no ttp, no rebound, no guarding  Extremities: No lower extremity edema.   Laboratory:  Recent Labs Lab 04/14/15 1040 04/14/15 1046 04/14/15 2026  WBC 7.4  --  8.6  HGB 15.1 17.0 14.6  HCT 43.4 50.0 42.4  PLT 297  --  280    Recent Labs Lab 04/14/15 1046  04/14/15 2026 04/15/15 0556  NA 134* 135 136  K 3.5 3.6 3.4*  CL 94* 100* 100*  CO2  --  25 28  BUN 7 8 6   CREATININE 0.70 0.84 0.70  CALCIUM  --  9.0 8.9  PROT  --  6.1* 6.0*  BILITOT  --  0.2* 0.2*  ALKPHOS  --  89 80  ALT  --  21 19  AST  --  27 21  GLUCOSE 408* 247* 149*     Imaging/Diagnostic Tests: Dg Tibia/fibula Right  03/30/2015  CLINICAL DATA:  Pain along stump.  Below the knee amputation. EXAM: RIGHT TIBIA AND FIBULA - 2 VIEW COMPARISON:  None. FINDINGS: Below the knee amputation noted. Dystrophic calcifications noted in the vicinity of the amputation site. Soft tissue thickness overlying the bony margin a minimum of 8 mm. There is some heterotopic ossification extending between the distal tibia and distal fibula margins. I do not see definite gas within the soft tissues. No destructive bony findings characteristic of osteomyelitis. Considerable tricompartmental spurring in the knee. IMPRESSION: 1. No definite gas tracking in the soft tissues along the stump. Dystrophic calcifications are observed and there is a minimum of 8 mm of soft tissue thickness overlying the bony margin. 2. Tricompartmental osteoarthritis. 3. No compelling radiographic findings of osteomyelitis. If pain persists despite conservative therapy, MRI may be warranted for further characterization. Electronically Signed   By: Van Clines M.D.   On: 03/30/2015 13:25   Mr Tibia Fibula Right W Wo Contrast  03/30/2015  CLINICAL DATA:  Below the knee amputations.  Osteomyelitis. EXAM: MRI OF LOWER RIGHT EXTREMITY WITHOUT AND WITH CONTRAST TECHNIQUE: Multiplanar, multisequence MR imaging of the right below-the-knee amputation stump region was performed both before and after administration of intravenous contrast. CONTRAST:  17mL MULTIHANCE GADOBENATE DIMEGLUMINE 529 MG/ML IV SOLN COMPARISON:  03/30/2015 FINDINGS: Low-level edema and very subtle enhancement in the soft tissues just below the distal fibular  margin and distal tibial margin on the right, with a slightly asymmetric 2.3 by 0.5 by 1.9 cm collection of fluid tracking around the lateral margin of the distal tibia anteriorly as shown on images 24-28 of series 3. There is a similar tiny fluid collection on the left but smaller. Both of these fluid collections do not I have enhancing margins and are probably not infected. Heterotopic ossification, low in signal intensity, noted spanning between the distal fibula and distal tibial margins. A drainable abscess is not seen. No compelling findings of osteomyelitis. Note is made of spurring in the knee joint. No knee effusion. Large field of view images include much of the distal thigh and accordingly this is not a highly detailed joint study of the knee. IMPRESSION: 1. Subtle edema and very low grade infiltrative enhancement along the distal margin of the tibia and fibula on the right appears asymmetric and likely represents mild soft tissue inflammation. This could certainly reflect cellulitis. No gas observed in the soft tissues. Tiny amount of fluid tracks around the distal margin of the right tibia anterolaterally, slightly more prominent than on the left, but without  enhancing margins to suggest infection/abscess. No osteomyelitis. 2. Osteoarthritis of both knees. Electronically Signed   By: Van Clines M.D.   On: 03/30/2015 19:13     Abdullah Rizzi Cletis Media, MD 04/15/2015, 8:32 AM PGY-1, [place] Intern pager: [phone], text pages welcome

## 2015-04-15 NOTE — Progress Notes (Signed)
Inpatient Diabetes Program Recommendations  AACE/ADA: New Consensus Statement on Inpatient Glycemic Control (2015)  Target Ranges:  Prepandial:   less than 140 mg/dL      Peak postprandial:   less than 180 mg/dL (1-2 hours)      Critically ill patients:  140 - 180 mg/dL    Results for Steven [name], Steven [name] (MRN [id number]) as of 04/15/2015 13:56  Ref. Range 04/14/2015 14:47 04/14/2015 18:02 04/14/2015 21:28  Glucose-Capillary Latest Ref Range: 65-99 mg/dL 277 (H) 344 (H) 224 (H)    Results for Steven [name], Steven [name] (MRN [id number]) as of 04/15/2015 13:56  Ref. Range 04/15/2015 07:52 04/15/2015 12:24 04/15/2015 13:28  Glucose-Capillary Latest Ref Range: 65-99 mg/dL 148 (H) 69 118 (H)    Results for Steven [name], Steven [name] (MRN [id number]) as of 04/15/2015 13:56  Ref. Range 04/14/2015 09:35  Hemoglobin A1C Unknown 11.2    Home DM Meds: Lantus 45 units daily       Novolog 10-15 units with breakfast and supper  Current Insulin Orders: Lantus 30 units QHS      Novolog Moderate (0-15 units) TID AC + HS    -Spoke to patient about his current A1c of 11.2%.  Explained what an A1c is and what it measures.  Reminded patient that his goal A1c is 7% or less per ADA standards to prevent both acute and long-term complications.  Explained to patient the extreme importance of good glucose control at home.  Encouraged patient to check his CBGs at least tid before meals and to record all CBGs in a logbook for his PCP to review.  -Patient was in a lot of pain during our conversation.  Kept asking me for pain meds and what time his surgery was.  I told patient I would ask his RN if he could have any more pain meds before his surgery this afternoon.  -Note through Chart Review that patient was last seen by his PCP ([facility]) on 11/29/14.  At that visit, patient was instructed to decrease his Lantus to 40 units daily and to move his lunch time dose of Novolog to dinner time.  Patient told me he is not  consistent with his insulin dosing and does not check CBGs as prescribed.  Encouraged patient to take his insulin and check his CBGs as prescribed and reminded patient about the devastating consequences of uncontrolled CBGs.    --Will follow patient during hospitalization--  Wyn Quaker RN, MSN, CDE Diabetes Coordinator Inpatient Glycemic Control Team Team Pager: [phone] (8a-5p)

## 2015-04-15 NOTE — Anesthesia Procedure Notes (Signed)
Procedure Name: LMA Insertion Date/Time: 04/15/2015 5:41 PM Performed by: Clearnce Sorrel Pre-anesthesia Checklist: Patient identified, Emergency Drugs available, Suction available, Patient being monitored and Timeout performed Patient Re-evaluated:Patient Re-evaluated prior to inductionOxygen Delivery Method: Circle system utilized Preoxygenation: Pre-oxygenation with 100% oxygen Intubation Type: IV induction LMA: LMA inserted LMA Size: 5.0 Number of attempts: 1 Placement Confirmation: positive ETCO2 and breath sounds checked- equal and bilateral Tube secured with: Tape Dental Injury: Teeth and Oropharynx as per pre-operative assessment

## 2015-04-15 NOTE — Op Note (Signed)
04/14/2015 - 04/15/2015  6:16 PM  PATIENT:  Steven [name]    PRE-OPERATIVE DIAGNOSIS:  GANGRENE RIGHT BELOW THE KNEE ALMPUTATION  POST-OPERATIVE DIAGNOSIS:  Same  PROCEDURE:  AMPUTATION ABOVE KNEE, RIGHT  SURGEON:  [name],[name], MD  PHYSICIAN ASSISTANT:None ANESTHESIA:   General  PREOPERATIVE INDICATIONS:  [name] is a  54 y.o. male with a diagnosis of [place] who failed conservative measures and elected for surgical management.    The risks benefits and alternatives were discussed with the patient preoperatively including but not limited to the risks of infection, bleeding, nerve injury, cardiopulmonary complications, the need for revision surgery, among others, and the patient was willing to proceed.  OPERATIVE IMPLANTS: None  OPERATIVE FINDINGS: Healthy viable muscle of the amputation site  OPERATIVE PROCEDURE: Patient was brought to the operating room and underwent a general anesthetic. After adequate levels anesthesia were obtained patient's right lower extremity was prepped using DuraPrep draped into a sterile field. [name] was used to cover all the necrotic residual limb. A timeout was called. A fishmouth incision was made through the distal thigh. Electrocautery was used to Bovie down to the bone the bone was cut with reciprocating saw. The medial vascular neurovascular bundle was suture ligated with 2-0 silk. Leg and necrotic residual limb was completed through the amputation. Electrocautery was used for hemostasis. The deep and superficial fascial layers were closed using #1 Vicryl the skin was closed using 2-0 nylon and staples. A aqua cell dressing was applied. Patient was extubated taken to the PACU in stable condition.

## 2015-04-15 NOTE — Transfer of Care (Signed)
Immediate Anesthesia Transfer of Care Note  Patient: [facility]  Procedure(s) Performed: Procedure(s): AMPUTATION ABOVE KNEE, RIGHT (Right)  Patient Location: PACU  Anesthesia Type:General  Level of Consciousness: awake, alert  and oriented  Airway & Oxygen Therapy: Patient Spontanous Breathing and Patient connected to nasal cannula oxygen  Post-op Assessment: Report given to RN and Post -op Vital signs reviewed and stable  Post vital signs: Reviewed and stable  Last Vitals:  Filed Vitals:   04/15/15 1538  BP: 157/72  Pulse: 73  Temp: 36.8 C  Resp: 16    Complications: No apparent anesthesia complications

## 2015-04-15 NOTE — Anesthesia Preprocedure Evaluation (Signed)
Anesthesia Evaluation  Patient identified by MRN, date of birth, ID band Patient awake    Reviewed: Allergy & Precautions, H&P , NPO status , Patient's Chart, lab work & pertinent test results  History of Anesthesia Complications Negative for: history of anesthetic complications  Airway Mallampati: II   Neck ROM: full    Dental  (+) Dental Advidsory Given, Edentulous Upper, Edentulous Lower   Pulmonary Current Smoker, former smoker,    breath sounds clear to auscultation       Cardiovascular hypertension, + Peripheral Vascular Disease and + DVT  + dysrhythmias Atrial Fibrillation  Rhythm:Regular     Neuro/Psych  Neuromuscular disease    GI/Hepatic (+)     substance abuse  alcohol use and cocaine use,   Endo/Other  diabetes, Type 2, Insulin Dependent  Renal/GU      Musculoskeletal   Abdominal   Peds  Hematology   Anesthesia Other Findings   Reproductive/Obstetrics                             Anesthesia Physical Anesthesia Plan  ASA: III  Anesthesia Plan: General   Post-op Pain Management:    Induction: Intravenous  Airway Management Planned: Oral ETT  Additional Equipment: None  Intra-op Plan:   Post-operative Plan: Extubation in OR  Informed Consent: I have reviewed the patients History and Physical, chart, labs and discussed the procedure including the risks, benefits and alternatives for the proposed anesthesia with the patient or authorized representative who has indicated his/her understanding and acceptance.   Dental advisory given  Plan Discussed with: CRNA and Surgeon  Anesthesia Plan Comments:         Anesthesia Quick Evaluation

## 2015-04-15 NOTE — Interval H&P Note (Signed)
History and Physical Interval Note:  04/15/2015 6:08 AM  Steven [name]  has presented today for surgery, with the diagnosis of GANGRENE LEFT BELOW THE KNEE ALMPUTATION  The various methods of treatment have been discussed with the patient and family. After consideration of risks, benefits and other options for treatment, the patient has consented to  Procedure(s): AMPUTATION ABOVE KNEE, RIGHT (Right) as a surgical intervention .  The patient's history has been reviewed, patient examined, no change in status, stable for surgery.  I have reviewed the patient's chart and labs.  Questions were answered to the patient's satisfaction.     Amey Hossain V

## 2015-04-15 NOTE — Care Management Note (Signed)
Case Management Note  Patient Details  Name: Steven [name] MRN: [id number] Date of Birth: [birth year]  Subjective/Objective:   Patient is from home alone, has bil bka, for surgery today for r stump aka.  Await pt eval.  NCM  Will cont to follow for dc needs.                 Action/Plan:   Expected Discharge Date:                  Expected Discharge Plan:  [place]  In-House Referral:     Discharge planning Services  CM Consult  Post Acute Care Choice:    Choice offered to:     DME Arranged:    DME Agency:     HH Arranged:    [place] Agency:     Status of Service:  In process, will continue to follow  Medicare Important Message Given:    Date Medicare IM Given:    Medicare IM give by:    Date Additional Medicare IM Given:    Additional Medicare Important Message give by:     If discussed at [place] of Stay Meetings, dates discussed:    Additional Comments:  [name], RN 04/15/2015, 5:17 PM

## 2015-04-15 NOTE — H&P (View-Only) (Signed)
Reason for Consult: Dry gangrene right transtibial amputation Referring Physician: Dr. Lovie Macadamia is an 54 y.o. male.  HPI: Patient is a 2 shell gentleman who is a bilateral transtibial amputation. Patient states that he had some right leg pain and put a heating pad around his right transtibial amputation. Patient developed full-thickness dry gangrene of the entire posterior aspect of his transtibial amputation.  Past Medical History  Diagnosis Date  . Uncontrolled diabetes mellitus ([place])   . HTN (hypertension)   . History of DVT (deep vein thrombosis)   . Personal history of diabetic foot ulcer   . Peripheral vascular disease ([place])     a. Abnl ABIs 2014.  Marland Kitchen Peripheral neuropathy ([place]) 11/19/2011  . Varicose vein     legs  . Osteomyelitis of foot, right, acute ([place]) 08/30/2012  . Personal history of colonic adenomas 05/29/2013  . History of cocaine use   . PAF (paroxysmal atrial fibrillation) ([place])     a. Dx 11/2013 in setting of sepsis/foot infection.  . Tobacco abuse   . H/O ETOH abuse   . Dysrhythmia     afib last admission  . Seasonal allergies     Past Surgical History  Procedure Laterality Date  . Amputation  04/21/2011    Procedure: AMPUTATION RAY;  Surgeon: Newt Minion, MD;  Location: [place];  Service: Orthopedics;  Laterality: Right;  Rt foot 2nd ray ampt  . I&d extremity  05/03/2011    Procedure: IRRIGATION AND DEBRIDEMENT EXTREMITY;  Surgeon: Newt Minion, MD;  Location: [place];  Service: Orthopedics;  Laterality: Right;  Irrigation and Debridement Right Foot and Place Acell Xenograft  . Toe amputation  04/24/2012    great toe   right foot  . Amputation  04/24/2012    Procedure: AMPUTATION RAY;  Surgeon: Newt Minion, MD;  Location: [place];  Service: Orthopedics;  Laterality: Right;  Right foot 1st ray amputation  . Amputation Left 04/23/2013    Procedure: AMPUTATION RAY;  Surgeon: Newt Minion, MD;  Location: [facility];  Service: Orthopedics;  Laterality: Left;   Left Foot 5th Ray Amputation  . Colonoscopy    . Amputation Left 11/21/2013    Procedure: AMPUTATION RAY;  Surgeon: Newt Minion, MD;  Location: [place];  Service: Orthopedics;  Laterality: Left;  Left Foot 1st & 2nd Ray Amputation  . Amputation Right 12/04/2013    Procedure: AMPUTATION BELOW KNEE;  Surgeon: Marianna Payment, MD;  Location: [place];  Service: Orthopedics;  Laterality: Right;  . Amputation Left 12/29/2013    Procedure: LEFT AMPUTATION BELOW KNEE;  Surgeon: Marianna Payment, MD;  Location: [facility];  Service: Orthopedics;  Laterality: Left;    Family History  Problem Relation Age of Onset  . Diabetes Mother   . Stroke Brother   . Colon cancer Neg Hx     Social History:  reports that he has been smoking Cigarettes.  He started smoking about 31 years ago. He has a 15 pack-year smoking history. He has never used smokeless tobacco. He reports that he drinks about 3.0 - 3.6 oz of alcohol per week. He reports that he does not use illicit drugs.  Allergies: No Known Allergies  Medications: I have reviewed the patient's current medications.  Results for orders placed or performed during the hospital encounter of 04/14/15 (from the past 48 hour(s))  CBC with Differential     Status: None   Collection Time: 04/14/15 10:40 AM  Result Value Ref Range   WBC 7.4 4.0 - 10.5 K/uL   RBC 5.15 4.22 - 5.81 MIL/uL   Hemoglobin 15.1 13.0 - 17.0 g/dL   HCT 43.4 39.0 - 52.0 %   MCV 84.3 78.0 - 100.0 fL   MCH 29.3 26.0 - 34.0 pg   MCHC 34.8 30.0 - 36.0 g/dL   RDW 14.1 11.5 - 15.5 %   Platelets 297 150 - 400 K/uL   Neutrophils Relative % 66 %   Neutro Abs 4.9 1.7 - 7.7 K/uL   Lymphocytes Relative 22 %   Lymphs Abs 1.6 0.7 - 4.0 K/uL   Monocytes Relative 11 %   Monocytes Absolute 0.8 0.1 - 1.0 K/uL   Eosinophils Relative 1 %   Eosinophils Absolute 0.1 0.0 - 0.7 K/uL   Basophils Relative 0 %   Basophils Absolute 0.0 0.0 - 0.1 K/uL  I-stat Chem 8, ED     Status: Abnormal   Collection  Time: 04/14/15 10:46 AM  Result Value Ref Range   Sodium 134 (L) 135 - 145 mmol/L   Potassium 3.5 3.5 - 5.1 mmol/L   Chloride 94 (L) 101 - 111 mmol/L   BUN 7 6 - 20 mg/dL   Creatinine, Ser 0.70 0.61 - 1.24 mg/dL   Glucose, Bld 408 (H) 65 - 99 mg/dL   Calcium, Ion 1.16 1.12 - 1.23 mmol/L   TCO2 25 0 - 100 mmol/L   Hemoglobin 17.0 13.0 - 17.0 g/dL   HCT 50.0 39.0 - 52.0 %  Urine rapid drug screen (hosp performed)     Status: Abnormal   Collection Time: 04/14/15 11:40 AM  Result Value Ref Range   Opiates NONE DETECTED NONE DETECTED   Cocaine POSITIVE (A) NONE DETECTED   Benzodiazepines NONE DETECTED NONE DETECTED   Amphetamines NONE DETECTED NONE DETECTED   Tetrahydrocannabinol NONE DETECTED NONE DETECTED   Barbiturates NONE DETECTED NONE DETECTED    Comment:        DRUG SCREEN FOR MEDICAL PURPOSES ONLY.  IF CONFIRMATION IS NEEDED FOR ANY PURPOSE, NOTIFY LAB WITHIN 5 DAYS.        LOWEST DETECTABLE LIMITS FOR URINE DRUG SCREEN Drug Class       Cutoff (ng/mL) Amphetamine      1000 Barbiturate      200 Benzodiazepine   272 Tricyclics       536 Opiates          300 Cocaine          300 THC              50   CBG monitoring, ED     Status: Abnormal   Collection Time: 04/14/15  2:47 PM  Result Value Ref Range   Glucose-Capillary 277 (H) 65 - 99 mg/dL  Lipid panel     Status: Abnormal   Collection Time: 04/14/15  4:35 PM  Result Value Ref Range   Cholesterol 182 0 - 200 mg/dL   Triglycerides 151 (H) <150 mg/dL   HDL 38 (L) >40 mg/dL   Total CHOL/HDL Ratio 4.8 RATIO   VLDL 30 0 - 40 mg/dL   LDL Cholesterol 114 (H) 0 - 99 mg/dL    Comment:        Total Cholesterol/HDL:CHD Risk Coronary Heart Disease Risk Table                     Men   Women  1/2 Average Risk   3.4  3.3  Average Risk       5.0   4.4  2 X Average Risk   9.6   7.1  3 X Average Risk  23.4   11.0        Use the calculated Patient Ratio above and the CHD Risk Table to determine the patient's CHD Risk.         ATP III CLASSIFICATION (LDL):  <100     mg/dL   Optimal  100-129  mg/dL   Near or Above                    Optimal  130-159  mg/dL   Borderline  160-189  mg/dL   High  >190     mg/dL   Very High     No results found.  Review of Systems  All other systems reviewed and are negative.  Blood pressure 159/93, pulse 70, temperature 98.6 F (37 C), temperature source Oral, resp. rate 18, SpO2 98 %. Physical Exam On examination patient has a intact left transtibial amputation. Examination of his right leg the entire posterior aspect of his right transtibial amputation is dry and gangrenous. There is no cellulitis no purulence no odor no signs of infection. Assessment/Plan: Assessment: Dry gangrene involving the entire posterior aspect of the residual limb right transtibial amputation.  Plan: Discussed with the patient is best option to get him back up on his feet would be to proceed with an above-the-knee amputation. Patient states he understands would like to proceed with surgery he states he like to proceed with surgery as soon as possible we will set this up for tomorrow around 5:00 PM on Thursday.  [name],[name] 04/14/2015, 6:03 PM

## 2015-04-16 ENCOUNTER — Encounter (HOSPITAL_COMMUNITY): Payer: Self-pay | Admitting: Orthopedic Surgery

## 2015-04-16 LAB — GLUCOSE, CAPILLARY
GLUCOSE-CAPILLARY: 254 mg/dL — AB (ref 65–99)
Glucose-Capillary: 180 mg/dL — ABNORMAL HIGH (ref 65–99)
Glucose-Capillary: 225 mg/dL — ABNORMAL HIGH (ref 65–99)
Glucose-Capillary: 291 mg/dL — ABNORMAL HIGH (ref 65–99)

## 2015-04-16 MED ORDER — METOCLOPRAMIDE HCL 5 MG/ML IJ SOLN: 5.0000 mg | Freq: Three times a day (TID) | INTRAMUSCULAR | Status: DC | PRN

## 2015-04-16 MED ORDER — SODIUM CHLORIDE 0.9 % IV SOLN: INTRAVENOUS | Status: DC

## 2015-04-16 MED ORDER — INSULIN GLARGINE 100 UNIT/ML ~~LOC~~ SOLN
15.0000 [IU] | Freq: Every day | SUBCUTANEOUS | Status: DC
  Filled 2015-04-16: qty 0.15

## 2015-04-16 MED ORDER — INSULIN ASPART 100 UNIT/ML ~~LOC~~ SOLN
0.0000 [IU] | Freq: Three times a day (TID) | SUBCUTANEOUS | Status: DC
  Administered 2015-04-16: 5 [IU] via SUBCUTANEOUS
  Administered 2015-04-16: 3 [IU] via SUBCUTANEOUS
  Administered 2015-04-17: 2 [IU] via SUBCUTANEOUS
  Administered 2015-04-17: 5 [IU] via SUBCUTANEOUS
  Administered 2015-04-17: 7 [IU] via SUBCUTANEOUS

## 2015-04-16 MED ORDER — RIVAROXABAN 20 MG PO TABS
20.0000 mg | ORAL_TABLET | Freq: Every day | ORAL | Status: DC
  Administered 2015-04-16 – 2015-04-19 (×4): 20 mg via ORAL
  Filled 2015-04-16 (×4): qty 1

## 2015-04-16 MED ORDER — ACETAMINOPHEN 650 MG RE SUPP: 650.0000 mg | Freq: Four times a day (QID) | RECTAL | Status: DC | PRN

## 2015-04-16 MED ORDER — ONDANSETRON HCL 4 MG PO TABS: 4.0000 mg | ORAL_TABLET | Freq: Four times a day (QID) | ORAL | Status: DC | PRN

## 2015-04-16 MED ORDER — ONDANSETRON HCL 4 MG/2ML IJ SOLN: 4.0000 mg | Freq: Four times a day (QID) | INTRAMUSCULAR | Status: DC | PRN

## 2015-04-16 MED ORDER — HYDROMORPHONE HCL 1 MG/ML IJ SOLN
1.0000 mg | INTRAMUSCULAR | Status: DC | PRN
  Administered 2015-04-16 – 2015-04-18 (×6): 1 mg via INTRAVENOUS
  Filled 2015-04-16 (×7): qty 1

## 2015-04-16 MED ORDER — HYDROXYZINE HCL 10 MG PO TABS
10.0000 mg | ORAL_TABLET | Freq: Three times a day (TID) | ORAL | Status: DC | PRN
Start: 2015-04-16 — End: 2015-04-19
  Administered 2015-04-16 – 2015-04-19 (×3): 10 mg via ORAL
  Filled 2015-04-16 (×6): qty 1

## 2015-04-16 MED ORDER — ACETAMINOPHEN 325 MG PO TABS: 650.0000 mg | ORAL_TABLET | Freq: Four times a day (QID) | ORAL | Status: DC | PRN

## 2015-04-16 MED ORDER — METOCLOPRAMIDE HCL 10 MG PO TABS: 5.0000 mg | ORAL_TABLET | Freq: Three times a day (TID) | ORAL | Status: DC | PRN

## 2015-04-16 MED ORDER — INSULIN GLARGINE 100 UNIT/ML ~~LOC~~ SOLN
30.0000 [IU] | Freq: Every day | SUBCUTANEOUS | Status: DC
  Administered 2015-04-17: 30 [IU] via SUBCUTANEOUS
  Filled 2015-04-16 (×2): qty 0.3

## 2015-04-16 NOTE — Care Management Note (Signed)
Case Management Note  Patient Details  Name: Steven [name] MRN: [id number] Date of Birth: [birth year]  Subjective/Objective:    Patient is s/p AKA anticipate snf for disposition, csw referral.                Action/Plan:   Expected Discharge Date:                  Expected Discharge Plan:  Skilled Nursing Facility  In-House Referral:     Discharge planning Services  CM Consult  Post Acute Care Choice:    Choice offered to:     DME Arranged:    DME Agency:     HH Arranged:    HH Agency:     Status of Service:  In process, will continue to follow  Medicare Important Message Given:    Date Medicare IM Given:    Medicare IM give by:    Date Additional Medicare IM Given:    Additional Medicare Important Message give by:     If discussed at [place] of Stay Meetings, dates discussed:    Additional Comments:  [name], RN 04/16/2015, 9:16 AM

## 2015-04-16 NOTE — Evaluation (Signed)
Physical Therapy Evaluation Patient Details Name: Steven [name] MRN: [id number] DOB: 12/11/1960 Today's Date: 04/16/2015   History of Present Illness  Adm 11/2 for revision Rt BKA to Rt AKA  PMHx- multiple bil LE amputations (PTA was bil BKA); PMHx-DM, cocaine use, ETOH use, neuropathy, HTN    Clinical Impression  Patient is s/p above surgery resulting in functional limitations due to the deficits listed below (see PT Problem List). Anticipate good progress as pain decreases. Pt was mobilizing at times to/from w/c without prostheses PTA. He will need practice with now new center of balance.  Patient will benefit from skilled PT to increase their independence and safety with mobility to allow discharge to the venue listed below.       Follow Up Recommendations CIR    Equipment Recommendations  None recommended by PT    Recommendations for Other Services Rehab consult;OT consult     Precautions / Restrictions Precautions Precautions: Fall Precaution Comments: Pt currently does not have Lt BKA prosthesis at hospital Restrictions Weight Bearing Restrictions: No      Mobility  Bed Mobility Overal bed mobility: Modified Independent             General bed mobility comments: pt able to come to long sitting with HOB 25; pivots to EOB slowly and maintained balance with single UE support x 2 minutes  Transfers Overall transfer level: Needs assistance Equipment used: None Transfers: Comptroller transfers: Min assist   General transfer comment: initiated posterior transfer as returning to supine from EOB; pt with good UE strength (somewhat limited use of RUE due to IV in hand and painful); pt did not want to actually scoot onto [facility] or recliner  Ambulation/Gait                Stairs            Wheelchair Mobility    Modified Rankin (Stroke Patients Only)       Balance Overall balance assessment: Needs  assistance Sitting-balance support: Single extremity supported Sitting balance-Leahy Scale: Poor Sitting balance - Comments: maintained midline/upright with single UE support                                     Pertinent Vitals/Pain Pain Assessment: 0-10 Pain Score: 10-Worst pain ever Pain Location: Rt AKA incision Pain Descriptors / Indicators: Operative site guarding Pain Intervention(s): Limited activity within patient's tolerance;Monitored during session;Patient requesting pain meds-RN notified;RN gave pain meds during session    [place] expects to be discharged to:: Private residence Living Arrangements: Alone   Type of Home: [place] Access: [place]: One [place]: [place] - 2 wheels;Wheelchair - power      Prior Function Level of Independence: Independent with assistive device(s)   Gait / Transfers Assistance Needed: walked with bil BKA prostheses and RW just prior to adm; used w/c first thing in a.m. and before bed (transferring without prostheses either lateral scoot or anterior-posterior)     Comments: drove electric w/c to nearby store for groceries; used transportation services for appointments     Hand Dominance   Dominant Hand: Right    Extremity/Trunk Assessment   Upper Extremity Assessment: Overall WFL for tasks assessed           Lower Extremity Assessment: RLE deficits/detail;LLE deficits/detail RLE Deficits /  Details: pt tolerated hip extension (lowering HOB) to -25 degrees; able to move AKA against gravity with incr time LLE Deficits / Details: Lt BKA with full ROM, strength WFL  Cervical / Trunk Assessment: Normal  Communication   Communication: No difficulties  Cognition Arousal/Alertness: Awake/alert Behavior During Therapy: WFL for tasks assessed/performed Overall Cognitive Status: Within Functional Limits for tasks assessed                       General Comments General comments (skin integrity, edema, etc.): Pt reports he will have someone bring in his Lt BKA prosthesis    Exercises Other Exercises Other Exercises: Discussed need to extend hip to 0 several times per day by lying bed flat (and demonstrated with bed control pt can reach and operate). Discouraged use of pillows and blanket under R AKA full time.      Assessment/Plan    PT Assessment Patient needs continued PT services  PT Diagnosis Acute pain;Other (comment);Difficulty walking (Rt AKA)   PT Problem List Decreased range of motion;Decreased activity tolerance;Decreased balance;Decreased mobility;Pain;Decreased skin integrity  PT Treatment Interventions DME instruction;Functional mobility training;Therapeutic activities;Therapeutic exercise;Balance training;Patient/family education;Wheelchair mobility training   PT Goals (Current goals can be found in the Care Plan section) Acute Rehab PT Goals Patient Stated Goal: return home alone with use of electric w/c PT Goal Formulation: With patient Time For Goal Achievement: 04/23/15 Potential to Achieve Goals: Good    Frequency Min 3X/week   Barriers to discharge Decreased caregiver support      Co-evaluation               End of Session Equipment Utilized During Treatment: Gait belt Activity Tolerance: Patient limited by pain Patient left: in bed;with call bell/phone within reach;with bed alarm set Nurse Communication: Mobility status;Other (comment) (work on lying flat several times per day)         Time: 9935-7017 PT Time Calculation (min) (ACUTE ONLY): 32 min   Charges:   PT Evaluation $Initial PT Evaluation Tier I: 1 Procedure PT Treatments $Therapeutic Activity: 8-22 mins   PT G Codes:        Johanny Segers 2015-05-09, 4:35 PM Pager [phone]

## 2015-04-16 NOTE — Progress Notes (Signed)
Patient ID: Steven [name], male   DOB: 10-26-1960, 54 y.o.   MRN: [id number] Postoperative day 1 right above-the-knee amputation. Patient complains of increased pain. Anticipate discharge to skilled nursing.

## 2015-04-16 NOTE — Progress Notes (Signed)
Rehab Admissions Coordinator Note:  Patient was screened by Cleatrice Burke for appropriateness for an Inpatient Acute Rehab Consult per PT recommendations.  At this time, we are recommending await progress over weekend and OT eval to determine need for intense inpt rehab..Pt had inpt rehab 7/15 and went home Mod i at w/c level.Cleatrice Burke 04/16/2015, 5:11 PM  I can be reached at [phone].

## 2015-04-16 NOTE — Discharge Summary (Signed)
[facility] Discharge Summary  Patient name: Steven [name] Medical record number: [id number] Date of birth: April 08, 1961 Age: 54 y.o. Gender: male Date of Admission: 04/14/2015  Date of Discharge: 04/19/2015 Admitting Physician: Zenia Resides, MD  Primary Care Provider: Chrisandra Netters, MD Consultants: Orthopedics   Indication for Hospitalization: Dry Gangrene of right BKA   Discharge Diagnoses/Problem List:  Patient Active Problem List   Diagnosis Date Noted  . Unilateral complete AKA ([place]) 04/19/2015  . Dry gangrene ([place])   . Right leg pain 04/14/2015  . Cough 08/12/2014  . Bilateral hand pain 05/29/2014  . Acne 03/30/2014  . S/P bilateral BKA (below knee amputation) ([place]) 01/22/2014  . Cocaine abuse 12/05/2013  . Tobacco abuse 12/04/2013  . Peripheral vascular disease ([place])   . Atrial fibrillation ([place]) 12/03/2013  . 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 History of colonic polyps 05/29/2013  . Diabetic retinopathy ([place]) 12/25/2012  . HLD (hyperlipidemia) 12/08/2012  . Peripheral neuropathy ([place]) 11/19/2011  . ETOH abuse 03/29/2011  . HTN (hypertension) 12/22/2010  . Diabetes mellitus type II, uncontrolled ([place]) 07/18/2006  . VENOUS INSUFFICIENCY 07/18/2006    Disposition: CIR  Discharge Condition: Stable   Discharge Exam:  General: Patient sitting up in bed, NAD  Cardiovascular: RRR, no murmurs, gallops, rubs  Respiratory: CTAB, no wheezing, rhonchi  Abdomen: BS+, no ttp, no rebound, no guarding  Extremities: Left BKA, Right AKA with dressing in place   Brief Hospital Course:  Patient was admitted for Dry Gangrene, Right stump (s/p R-BKA), in setting of PVD. 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 Patient with with 3-4 week hx of evolving Eschar on lower right stump, no signs of local infection, no drainage. Patient afebrile, not tachy, WBC wnl, without any patient hx of systemic infection. However, there is  concern for vascular compromise as patient's limb is cool to the touch  at tip of stump. Orthopedics was consulted, and indicated the need for an AKA. This operation was performed on 70/11/2374 without complication. Patient tolerated procedure well. Patient continued to receive Percocet for pain during this admission as well as gabapentin. Patient's insulin was titrated throughout stay, previously patient had not been taking insulin regularly. Also on admission patient noted to have UDS positive for cocaine.    Issues for Follow Up:  1. Continue following blood sugars, patient currently taking 45 units of Lantus and moderate SSI  2. Continue to monitor patient's pain, patient with oral percocet  3. Follow up on patient's needs physical therapy needs as an outpatient.  4. Follow up patients substance use as needed.   Significant Procedures:  Right AKA 04/15/2015 Patient was brought to the operating room and underwent a general anesthetic. After adequate levels anesthesia were obtained patient's right lower extremity was prepped using DuraPrep draped into a sterile field. [name] was used to cover all the necrotic residual limb. A timeout was called. A fishmouth incision was made through the distal thigh. Electrocautery was used to Bovie down to the bone the bone was cut with reciprocating saw. The medial vascular neurovascular bundle was suture ligated with 2-0 silk. Leg and necrotic residual limb was completed through the amputation. Electrocautery was used for hemostasis. The deep and superficial fascial layers were closed using #1 Vicryl the skin was closed using 2-0 nylon and staples. A aqua cell dressing was applied. Patient was extubated taken to the PACU in stable condition   Significant Labs and Imaging:   Recent Labs Lab  04/14/15 1040 04/14/15 1046 04/14/15 2026 04/17/15 0511  WBC 7.4  --  8.6 9.6  HGB 15.1 17.0 14.6 12.7*  HCT 43.4 50.0 42.4 38.7*  PLT 297  --  280 291    Recent  Labs Lab 04/14/15 1046 04/14/15 2026 04/15/15 0556  NA 134* 135 136  K 3.5 3.6 3.4*  CL 94* 100* 100*  CO2  --  25 28  GLUCOSE 408* 247* 149*  BUN 7 8 6   CREATININE 0.70 0.84 0.70  CALCIUM  --  9.0 8.9  ALKPHOS  --  89 80  AST  --  27 21  ALT  --  21 19  ALBUMIN  --  2.2* 2.1*    Results/Tests Pending at Time of Discharge: None   Discharge Medications:    Medication List    STOP taking these medications        aspirin 325 MG tablet  Replaced by:  aspirin EC 81 MG tablet      TAKE these medications        acetaminophen 325 MG tablet  Commonly known as:  TYLENOL  Take 2 tablets (650 mg total) by mouth every 6 (six) hours as needed.     aspirin EC 81 MG tablet  Take 2 tablets (162 mg total) by mouth daily.     atorvastatin 40 MG tablet  Commonly known as:  LIPITOR  Take 1 tablet (40 mg total) by mouth daily.     cetirizine 10 MG tablet  Commonly known as:  ZYRTEC  Take 10 mg by mouth daily.     diltiazem 120 MG 24 hr capsule  Commonly known as:  CARDIZEM CD  Take 120 mg by mouth daily.     fluticasone 50 MCG/ACT nasal spray  Commonly known as:  FLONASE  Place 2 sprays into both nostrils daily.     folic acid 1 MG tablet  Commonly known as:  FOLVITE  Take 1 tablet (1 mg total) by mouth daily.     gabapentin 300 MG capsule  Commonly known as:  NEURONTIN  Take 300mg  every morning, 300mg  every day at noon, and 600mg  every night at bedtime.     insulin aspart 100 UNIT/ML injection  Commonly known as:  novoLOG  Inject 10-15 Units into the skin daily before supper.     insulin glargine 100 UNIT/ML injection  Commonly known as:  LANTUS  Inject 0.45 mLs (45 Units total) into the skin daily before breakfast.     lisinopril 20 MG tablet  Commonly known as:  PRINIVIL,ZESTRIL  Take 1 tablet (20 mg total) by mouth daily. For blood pressure     methocarbamol 500 MG tablet  Commonly known as:  ROBAXIN  Take 1 tablet (500 mg total) by mouth every 6 (six)  hours as needed for muscle spasms.     nicotine 21 mg/24hr patch  Commonly known as:  NICODERM CQ - dosed in mg/24 hours  Place 1 patch (21 mg total) onto the skin daily.     nitroGLYCERIN 0.2 mg/hr patch  Commonly known as:  NITRODUR - Dosed in mg/24 hr  Place 1 patch (0.2 mg total) onto the skin daily.     oxyCODONE-acetaminophen 5-325 MG tablet  Commonly known as:  PERCOCET  Take 1-2 tablets by mouth every 4 (four) hours as needed.     pantoprazole 40 MG tablet  Commonly known as:  PROTONIX  Take 1 tablet (40 mg total) by mouth at bedtime.  rivaroxaban 20 MG Tabs tablet  Commonly known as:  XARELTO  Take 1 tablet (20 mg total) by mouth daily with supper. For Atrial Fibrillation        Discharge Instructions: Please refer to Patient Instructions section of EMR for full details.  Patient was counseled important signs and symptoms that should prompt return to medical care, changes in medications, dietary instructions, activity restrictions, and follow up appointments.   Follow-Up Appointments: Follow-up Information    Follow up with [name],[name], MD In 2 weeks.   Specialty:  Orthopedic Surgery   Contact information:   [place] [id number] [phone]       Tonette Bihari, MD 04/19/2015, 7:43 PM PGY-1, [place]

## 2015-04-16 NOTE — Care Management Note (Signed)
Case Management Note  Patient Details  Name: Steven [name] MRN: [id number] Date of Birth: 15-Mar-1961  Subjective/Objective:    Date: 04/16/15 Spoke with patient at the bedside along with sister, Steven [name]  [phone]. Introduced self as Tourist information centre manager and explained role in discharge planning and how to be reached. Verified patient lives in town, alone. Has DME rolling walker and a motorized w/chair  And his prothesis for left stump at home . Expressed potential need for a hospital bed. Verified patient anticipates to go SNF or CIR  at time of discharge and will have no supervison at this time to best of their knowledge. Patient denied needing help with their medication. Patient  takes medicaid transportation  to MD appointments. Verified patient has PCP Tanzania Mcintyre.   Plan: CM will continue to follow for discharge planning and [facility] resources.                 Action/Plan:   Expected Discharge Date:                  Expected Discharge Plan:  [place]  In-House Referral:     Discharge planning Services  CM Consult  Post Acute Care Choice:    Choice offered to:     DME Arranged:    DME Agency:     HH Arranged:    [facility] [place] Agency:     Status of Service:  In process, will continue to follow  Medicare Important Message Given:    Date Medicare IM Given:    Medicare IM give by:    Date Additional Medicare IM Given:    Additional Medicare Important Message give by:     If discussed at [place] of Stay Meetings, dates discussed:    Additional Comments:  [name], RN 04/16/2015, 3:06 PM

## 2015-04-16 NOTE — Progress Notes (Signed)
Family Medicine Teaching Service Daily Progress Note Intern Pager: [phone]  Patient name: Steven [name] Medical record number: [id number] Date of birth: 1961-05-05 Age: 54 y.o. Gender: male  Primary Care Provider: Chrisandra Netters, MD Consultants: Orthopedics  Code Status: Full   Pt Overview and Major Events to Date:   Assessment and Plan: Steven [name] is a 54 y.o. male presenting with right knee stump pain, worsening over 3 weeks with chronic ulceration and eschar, suggestive of dry gangrene. PMH is significant for Afib (on xarelto) DM-2 (poorly controlled, complicated peripheral neuropathy), PVD, HTN, HLD, Tobacco/Cocaine Abuse,   Dry Gangrene, Right stump (s/p R-BKA), in setting of PVD:  Patient s/p AKA, doing well this morning, no complaints of pain. 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 Currently patient with evolving (over 3-4 weeks) Eschar on lower right stump, no signs of local infection, no drainage.Most recently seen on 10/18 by vascular surgery and found to have adequate blood supply to right lower limb with collaterals, despite occulusion of superficial femoral and popliteal artery. Likewise patient had a MRI on 10/18 which was positive for mild soft issue inflammation, and negative for osteomyelitis or abscess. Patient was treated with vancomycin in the ED and provided with course of Keflex at that time for concern of cellulitis. Patient has now returned with continued pain and worsening eschar/ulcer.  -  S/p AKA on 11/3 -  Continue oral Percocet, stopped IV Morphine  -  PT/OT eval post-op  - Social work eval for placement   Paroxysmal Atrial fibrillation: CHADs-VASC score 3  - Diltiazem 120mg  daily, avoiding BB due to hx of cocaine use  - Restarted Xarelto  PVD: ABI in 2014. Chronic known PVD, s/p bilateral BKAs for gangrene. Multiple contributing factors with uncontrolled DM2, active smoker with tobacco abuse, active use of  cocaine  T2DM, uncontrolled: Hb A1c 11.2. Home Diabetes regiment includes Lantus 45 units and Novolog 10-15 units TID with meals. Per patient, he does not take his insulin regiment consistently, has not taken Lantus in last week. Admission CBG of 408  - Current CBGs 180, without eating this AM  - Continue Lantus at 30 units this AM (held overnight) - SSI sensitive  - CBGs ACQHS   HTN BP 159/93. Not compliant with blood pressure medication - Will restart home lisinopril, SCr 0.7  Bilateral BKA: Right BKA performed by Dr. 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 - Will continue to monitor   HLD  - Continue Lipitor 40 mg --> would consider increasing to 80 mg  - Will get lipid panel, LDL 114, HDL 38   Cocaine/Tobacco abuse : UDS positive cocaine, states only uses Cocaine intermittently. Smokes about half pack a day  - Nicotine patch   EtOH Abuse Hx: Patient denies using any EtOH recently. CIWA protocol.   FEN/GI: PPI, carb modified diet  Prophylaxis: Lovenox SQ  Disposition: Med-surg   Subjective:  Patient doing well this morning. Has no complaints of pain. States that his back itches a bit. No n/v, chills, fever, or chest pain. He was eating his breakfast and was very hungry   Objective: Temp:  [98.3 F (36.8 C)-99.4 F (37.4 C)] 99.2 F (37.3 C) (11/04 0711) Pulse Rate:  [73-91] 83 (11/04 0711) Resp:  [9-22] 16 (11/04 0711) BP: (98-179)/(72-98) 171/90 mmHg (11/04 0711) SpO2:  [95 %-100 %] 100 % (11/04 0711) Physical Exam: General: Patient sitting up in bed, NAD  Cardiovascular: RRR,  no murmurs, gallops, rubs  Respiratory: CTAB, no wheezing, rhonchi  Abdomen: BS+, no ttp, no rebound, no guarding  Extremities: No lower extremity edema.   Laboratory:  Recent Labs Lab 04/14/15 1040 04/14/15 1046 04/14/15 2026  WBC 7.4  --  8.6  HGB 15.1 17.0 14.6  HCT 43.4 50.0 42.4  PLT 297  --  280    Recent Labs Lab 04/14/15 1046 04/14/15 2026  04/15/15 0556  NA 134* 135 136  K 3.5 3.6 3.4*  CL 94* 100* 100*  CO2  --  25 28  BUN 7 8 6   CREATININE 0.70 0.84 0.70  CALCIUM  --  9.0 8.9  PROT  --  6.1* 6.0*  BILITOT  --  0.2* 0.2*  ALKPHOS  --  89 80  ALT  --  21 19  AST  --  27 21  GLUCOSE 408* 247* 149*     Imaging/Diagnostic Tests: Dg Tibia/fibula Right  03/30/2015  CLINICAL DATA:  Pain along stump.  Below the knee amputation. EXAM: RIGHT TIBIA AND FIBULA - 2 VIEW COMPARISON:  None. FINDINGS: Below the knee amputation noted. Dystrophic calcifications noted in the vicinity of the amputation site. Soft tissue thickness overlying the bony margin a minimum of 8 mm. There is some heterotopic ossification extending between the distal tibia and distal fibula margins. I do not see definite gas within the soft tissues. No destructive bony findings characteristic of osteomyelitis. Considerable tricompartmental spurring in the knee. IMPRESSION: 1. No definite gas tracking in the soft tissues along the stump. Dystrophic calcifications are observed and there is a minimum of 8 mm of soft tissue thickness overlying the bony margin. 2. Tricompartmental osteoarthritis. 3. No compelling radiographic findings of osteomyelitis. If pain persists despite conservative therapy, MRI may be warranted for further characterization. Electronically Signed   By: Van Clines M.D.   On: 03/30/2015 13:25   Mr Tibia Fibula Right W Wo Contrast  03/30/2015  CLINICAL DATA:  Below the knee amputations.  Osteomyelitis. EXAM: MRI OF LOWER RIGHT EXTREMITY WITHOUT AND WITH CONTRAST TECHNIQUE: Multiplanar, multisequence MR imaging of the right below-the-knee amputation stump region was performed both before and after administration of intravenous contrast. CONTRAST:  64mL MULTIHANCE GADOBENATE DIMEGLUMINE 529 MG/ML IV SOLN COMPARISON:  03/30/2015 FINDINGS: Low-level edema and very subtle enhancement in the soft tissues just below the distal fibular margin and distal  tibial margin on the right, with a slightly asymmetric 2.3 by 0.5 by 1.9 cm collection of fluid tracking around the lateral margin of the distal tibia anteriorly as shown on images 24-28 of series 3. There is a similar tiny fluid collection on the left but smaller. Both of these fluid collections do not I have enhancing margins and are probably not infected. Heterotopic ossification, low in signal intensity, noted spanning between the distal fibula and distal tibial margins. A drainable abscess is not seen. No compelling findings of osteomyelitis. Note is made of spurring in the knee joint. No knee effusion. Large field of view images include much of the distal thigh and accordingly this is not a highly detailed joint study of the knee. IMPRESSION: 1. Subtle edema and very low grade infiltrative enhancement along the distal margin of the tibia and fibula on the right appears asymmetric and likely represents mild soft tissue inflammation. This could certainly reflect cellulitis. No gas observed in the soft tissues. Tiny amount of fluid tracks around the distal margin of the right tibia anterolaterally, slightly more prominent than on the left,  but without enhancing margins to suggest infection/abscess. No osteomyelitis. 2. Osteoarthritis of both knees. Electronically Signed   By: Van Clines M.D.   On: 03/30/2015 19:13     Jurline Folger Cletis Media, MD 04/16/2015, 8:27 AM PGY-1, [place] Intern pager: [phone], text pages welcome

## 2015-04-16 NOTE — Clinical Social Work Note (Signed)
CSW received consult for possible SNF placement, CSW to continue to follow patient's progress.  Jones Broom. [place], MSW, [place] 04/16/2015 6:46 PM

## 2015-04-17 DIAGNOSIS — I96 Gangrene, not elsewhere classified: Secondary | ICD-10-CM | POA: Insufficient documentation

## 2015-04-17 LAB — CBC
HCT: 38.7 % — ABNORMAL LOW (ref 39.0–52.0)
Hemoglobin: 12.7 g/dL — ABNORMAL LOW (ref 13.0–17.0)
MCH: 28.4 pg (ref 26.0–34.0)
MCHC: 32.8 g/dL (ref 30.0–36.0)
MCV: 86.6 fL (ref 78.0–100.0)
PLATELETS: 291 10*3/uL (ref 150–400)
RBC: 4.47 MIL/uL (ref 4.22–5.81)
RDW: 14.6 % (ref 11.5–15.5)
WBC: 9.6 10*3/uL (ref 4.0–10.5)

## 2015-04-17 LAB — GLUCOSE, CAPILLARY
GLUCOSE-CAPILLARY: 165 mg/dL — AB (ref 65–99)
Glucose-Capillary: 265 mg/dL — ABNORMAL HIGH (ref 65–99)
Glucose-Capillary: 310 mg/dL — ABNORMAL HIGH (ref 65–99)
Glucose-Capillary: 282 mg/dL — ABNORMAL HIGH (ref 65–99)

## 2015-04-17 MED ORDER — INSULIN ASPART 100 UNIT/ML ~~LOC~~ SOLN
0.0000 [IU] | Freq: Three times a day (TID) | SUBCUTANEOUS | Status: DC
  Administered 2015-04-18: 3 [IU] via SUBCUTANEOUS
  Administered 2015-04-18: 5 [IU] via SUBCUTANEOUS
  Administered 2015-04-18: 15 [IU] via SUBCUTANEOUS
  Administered 2015-04-19: 3 [IU] via SUBCUTANEOUS
  Administered 2015-04-19 (×2): 5 [IU] via SUBCUTANEOUS

## 2015-04-17 MED ORDER — GABAPENTIN 300 MG PO CAPS
600.0000 mg | ORAL_CAPSULE | Freq: Three times a day (TID) | ORAL | Status: DC
  Administered 2015-04-17 – 2015-04-19 (×7): 600 mg via ORAL
  Filled 2015-04-17 (×7): qty 2

## 2015-04-17 NOTE — Progress Notes (Signed)
Patient ID: Steven [name], male   DOB: 1960-12-04, 54 y.o.   MRN: [id number] Postoperative day 2 right above-the-knee amputation. Patient is comfortable and eating breakfast this morning. Hopefully patient can be discharged to skilled nursing.

## 2015-04-17 NOTE — Progress Notes (Signed)
Family Medicine Teaching Service Daily Progress Note Intern Pager: [phone]  Patient name: Steven [name] Medical record number: [id number] Date of birth: 10-May-1961 Age: 54 y.o. Gender: male  Primary Care Provider: Chrisandra Netters, MD Consultants: Orthopedics  Code Status: Full   Pt Overview and Major Events to Date:   Assessment and Plan: JERZY ROEPKE is a 54 y.o. male presenting with right knee stump pain, worsening over 3 weeks with chronic ulceration and eschar, suggestive of dry gangrene. PMH is significant for Afib (on xarelto) DM-2 (poorly controlled, complicated peripheral neuropathy), PVD, HTN, HLD, Tobacco/Cocaine Abuse,   Dry Gangrene, Right stump (s/p R-BKA), in setting of PVD:  Patient in pain this AM, AKA dressing in place, dry dressing from the outside. Dr. Sharol Given following with patient's status.  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 Evolving  (over 3-4 weeks) Eschar on lower right stump, no signs of local infection, no drainage. Diagnosis of dry Gangrene , s/p AKA on 11/3.  -  Continue oral Percocet -  Will increase Gabapentin from 300 to 600 mg TID, for neuropathic pain  -  PT recommends CIR, Awaiting OT, CIR consulted will follow up after weekend progress.  -  Social work following for possible placement   Paroxysmal Atrial fibrillation: CHADs-VASC score 3  - Diltiazem 120mg  daily, avoiding BB due to hx of cocaine use  - Continue Xarelto  PVD: ABI in 2014. Chronic known PVD, s/p bilateral BKAs for gangrene. Multiple contributing factors with uncontrolled DM2, active smoker with tobacco abuse, active use of cocaine  T2DM, uncontrolled: Hb A1c 11.2. Home Diabetes regiment includes Lantus 45 units and Novolog 10-15 units TID with meals. Per patient, he does not take his insulin regiment consistently, has not taken Lantus in last week. Admission CBG of 408  - Current CBGs 254, 165 - Continue Lantus at 30 units AM  - SSI sensitive  - CBGs ACQHS    HTN BP 151/68. Not compliant with blood pressure medication - Continue lisinopril, SCr 0.7  Bilateral Leg Amputations Right BKA performed by Dr. 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 Now with a right AKA after found to have dry gangrene   HLD  - Continue Lipitor 40 mg --> would consider increasing to 80 mg  - Will get lipid panel, LDL 114, HDL 38   Cocaine/Tobacco abuse : UDS positive cocaine, states only uses Cocaine intermittently. Smokes about half pack a day  - Nicotine patch   EtOH Abuse Hx: Patient denies using any EtOH recently. CIWA protocol.   FEN/GI: PPI, carb modified diet  Prophylaxis: Lovenox SQ  Disposition: Med-surg   Subjective:  Patient was rocking back and forth in bed. States that his pain is about a 9. Did not sleep well last night due to pain. Otherwise patient has not complaints n/v, chest pain, fever or chills    Objective: Temp:  [98.7 F (37.1 C)-99.8 F (37.7 C)] 99.8 F (37.7 C) (11/05 0514) Pulse Rate:  [90-94] 94 (11/05 0514) Resp:  [18-20] 18 (11/05 0514) BP: (141-151)/(68-83) 151/68 mmHg (11/05 0514) SpO2:  [92 %-98 %] 94 % (11/05 0514) Physical Exam: General: Patient sitting up in bed, NAD  Cardiovascular: RRR, no murmurs, gallops, rubs  Respiratory: CTAB, no wheezing, rhonchi  Abdomen: BS+, no ttp, no rebound, no guarding  Extremities: Left BKA, Right AKA with dressing in place   Laboratory:  Recent Labs Lab 04/14/15 1040 04/14/15 1046 04/14/15 2026 04/17/15 0511  WBC  7.4  --  8.6 9.6  HGB 15.1 17.0 14.6 12.7*  HCT 43.4 50.0 42.4 38.7*  PLT 297  --  280 291    Recent Labs Lab 04/14/15 1046 04/14/15 2026 04/15/15 0556  NA 134* 135 136  K 3.5 3.6 3.4*  CL 94* 100* 100*  CO2  --  25 28  BUN 7 8 6   CREATININE 0.70 0.84 0.70  CALCIUM  --  9.0 8.9  PROT  --  6.1* 6.0*  BILITOT  --  0.2* 0.2*  ALKPHOS  --  89 80  ALT  --  21 19  AST  --  27 21  GLUCOSE 408* 247* 149*     Imaging/Diagnostic  Tests: Dg Tibia/fibula Right  03/30/2015  CLINICAL DATA:  Pain along stump.  Below the knee amputation. EXAM: RIGHT TIBIA AND FIBULA - 2 VIEW COMPARISON:  None. FINDINGS: Below the knee amputation noted. Dystrophic calcifications noted in the vicinity of the amputation site. Soft tissue thickness overlying the bony margin a minimum of 8 mm. There is some heterotopic ossification extending between the distal tibia and distal fibula margins. I do not see definite gas within the soft tissues. No destructive bony findings characteristic of osteomyelitis. Considerable tricompartmental spurring in the knee. IMPRESSION: 1. No definite gas tracking in the soft tissues along the stump. Dystrophic calcifications are observed and there is a minimum of 8 mm of soft tissue thickness overlying the bony margin. 2. Tricompartmental osteoarthritis. 3. No compelling radiographic findings of osteomyelitis. If pain persists despite conservative therapy, MRI may be warranted for further characterization. Electronically Signed   By: Van Clines M.D.   On: 03/30/2015 13:25   Mr Tibia Fibula Right W Wo Contrast  03/30/2015  CLINICAL DATA:  Below the knee amputations.  Osteomyelitis. EXAM: MRI OF LOWER RIGHT EXTREMITY WITHOUT AND WITH CONTRAST TECHNIQUE: Multiplanar, multisequence MR imaging of the right below-the-knee amputation stump region was performed both before and after administration of intravenous contrast. CONTRAST:  64mL MULTIHANCE GADOBENATE DIMEGLUMINE 529 MG/ML IV SOLN COMPARISON:  03/30/2015 FINDINGS: Low-level edema and very subtle enhancement in the soft tissues just below the distal fibular margin and distal tibial margin on the right, with a slightly asymmetric 2.3 by 0.5 by 1.9 cm collection of fluid tracking around the lateral margin of the distal tibia anteriorly as shown on images 24-28 of series 3. There is a similar tiny fluid collection on the left but smaller. Both of these fluid collections do not  I have enhancing margins and are probably not infected. Heterotopic ossification, low in signal intensity, noted spanning between the distal fibula and distal tibial margins. A drainable abscess is not seen. No compelling findings of osteomyelitis. Note is made of spurring in the knee joint. No knee effusion. Large field of view images include much of the distal thigh and accordingly this is not a highly detailed joint study of the knee. IMPRESSION: 1. Subtle edema and very low grade infiltrative enhancement along the distal margin of the tibia and fibula on the right appears asymmetric and likely represents mild soft tissue inflammation. This could certainly reflect cellulitis. No gas observed in the soft tissues. Tiny amount of fluid tracks around the distal margin of the right tibia anterolaterally, slightly more prominent than on the left, but without enhancing margins to suggest infection/abscess. No osteomyelitis. 2. Osteoarthritis of both knees. Electronically Signed   By: Van Clines M.D.   On: 03/30/2015 19:13     Elana Jian Cletis Media, MD  04/17/2015, 10:17 AM PGY-1, [place] Intern pager: [phone], text pages welcome

## 2015-04-18 LAB — GLUCOSE, CAPILLARY
GLUCOSE-CAPILLARY: 442 mg/dL — AB (ref 65–99)
GLUCOSE-CAPILLARY: 151 mg/dL — AB (ref 65–99)
Glucose-Capillary: 225 mg/dL — ABNORMAL HIGH (ref 65–99)
Glucose-Capillary: 315 mg/dL — ABNORMAL HIGH (ref 65–99)

## 2015-04-18 MED ORDER — INSULIN GLARGINE 100 UNIT/ML ~~LOC~~ SOLN
37.0000 [IU] | Freq: Every day | SUBCUTANEOUS | Status: DC
  Administered 2015-04-18 – 2015-04-19 (×2): 37 [IU] via SUBCUTANEOUS
  Filled 2015-04-18 (×2): qty 0.37

## 2015-04-18 MED ORDER — POLYETHYLENE GLYCOL 3350 17 G PO PACK
17.0000 g | PACK | Freq: Every day | ORAL | Status: DC
  Administered 2015-04-18 – 2015-04-19 (×2): 17 g via ORAL
  Filled 2015-04-18 (×2): qty 1

## 2015-04-18 NOTE — Progress Notes (Signed)
Family Medicine Teaching Service Daily Progress Note Intern Pager: [phone]  Patient name: Steven [name] Medical record number: [id number] Date of birth: 04/28/1961 Age: 54 y.o. Gender: male  Primary Care Provider: Chrisandra Netters, MD Consultants: Orthopedics  Code Status: Full   Pt Overview and Major Events to Date:   Assessment and Plan: Steven [name] is a 54 y.o. male presenting with right knee stump pain, worsening over 3 weeks with chronic ulceration and eschar, suggestive of dry gangrene. PMH is significant for Afib (on xarelto) DM-2 (poorly controlled, complicated peripheral neuropathy), PVD, HTN, HLD, Tobacco/Cocaine Abuse,   Dry Gangrene, Right stump (s/p R-BKA), in setting of PVD:  Patient in pain this AM, AKA dressing in place, dry dressing from the outside. Dr. Sharol Given following with patient's status.  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 Evolving  (over 3-4 weeks) Eschar on lower right stump, no signs of local infection, no drainage. Diagnosis of dry Gangrene , s/p AKA on 11/3.  -  Continue oral Percocet, d/c IV Dilaudid -  Continue Gabapentin 600 mg TID, for neuropathic pain  -  PT recommends CIR, Awaiting OT, CIR consulted will follow up after weekend progress.  -  Social work following for possible placement   Paroxysmal Atrial fibrillation: CHADs-VASC score 3  - Diltiazem 120mg  daily, avoiding BB due to hx of cocaine use  - Continue Xarelto  T2DM, uncontrolled: Hb A1c 11.2. Home Diabetes regiment includes Lantus 45 units and Novolog 10-15 units TID with meals. Per patient, he does not take his insulin regiment consistently, has not taken Lantus in last week. Current CBGs 200-300 - Increase Lantus to 37 units this AM  - SSI moderate  - CBGs ACQHS   HTN BP 151/68. Not compliant with blood pressure medication - Continue lisinopril, SCr 0.7  Bilateral Leg Amputations Right BKA performed by Dr. Erlinda Hong 12/04/2013 and left BKA 12/29/2013 both due to  gangrene. Now with a right AKA after found to have dry gangrene   HLD LDL 114, HDL 38  - Continue Lipitor 40 mg  Cocaine/Tobacco abuse : UDS positive cocaine, states only uses Cocaine intermittently. Smokes about half pack a day  - Nicotine patch   EtOH Abuse Hx: Patient denies using any EtOH recently. CIWA protocol.   FEN/GI: PPI, carb modified diet  Prophylaxis: Lovenox SQ  Disposition: Med-surg, d/c pending CIR evaluation  Subjective:  Sitting in bed. Wants more pain meds.  Objective: Temp:  [98.3 F (36.8 C)-99.7 F (37.6 C)] 99.3 F (37.4 C) (11/06 0522) Pulse Rate:  [84-92] 84 (11/06 0522) Resp:  [18] 18 (11/06 0522) BP: (138-149)/(62-81) 149/80 mmHg (11/06 0522) SpO2:  [96 %-99 %] 96 % (11/06 0522) Physical Exam: General: Patient sitting up in bed, NAD Cardiovascular: RRR, no murmurs, gallops, rubs  Respiratory: CTAB, no wheezing, rhonchi  Abdomen: BS+, no ttp, no rebound, no guarding  Extremities: Left BKA, Right AKA with dressing in place   Laboratory:  Recent Labs Lab 04/14/15 1040 04/14/15 1046 04/14/15 2026 04/17/15 0511  WBC 7.4  --  8.6 9.6  HGB 15.1 17.0 14.6 12.7*  HCT 43.4 50.0 42.4 38.7*  PLT 297  --  280 291    Recent Labs Lab 04/14/15 1046 04/14/15 2026 04/15/15 0556  NA 134* 135 136  K 3.5 3.6 3.4*  CL 94* 100* 100*  CO2  --  25 28  BUN 7 8 6   CREATININE 0.70 0.84 0.70  CALCIUM  --  9.0 8.9  PROT  --  6.1* 6.0*  BILITOT  --  0.2* 0.2*  ALKPHOS  --  89 80  ALT  --  21 19  AST  --  27 21  GLUCOSE 408* 247* 149*     Imaging/Diagnostic Tests: Dg Tibia/fibula Right  03/30/2015  CLINICAL DATA:  Pain along stump.  Below the knee amputation. EXAM: RIGHT TIBIA AND FIBULA - 2 VIEW COMPARISON:  None. FINDINGS: Below the knee amputation noted. Dystrophic calcifications noted in the vicinity of the amputation site. Soft tissue thickness overlying the bony margin a minimum of 8 mm. There is some heterotopic ossification extending  between the distal tibia and distal fibula margins. I do not see definite gas within the soft tissues. No destructive bony findings characteristic of osteomyelitis. Considerable tricompartmental spurring in the knee. IMPRESSION: 1. No definite gas tracking in the soft tissues along the stump. Dystrophic calcifications are observed and there is a minimum of 8 mm of soft tissue thickness overlying the bony margin. 2. Tricompartmental osteoarthritis. 3. No compelling radiographic findings of osteomyelitis. If pain persists despite conservative therapy, MRI may be warranted for further characterization. Electronically Signed   By: Van Clines M.D.   On: 03/30/2015 13:25   Mr Tibia Fibula Right W Wo Contrast  03/30/2015  CLINICAL DATA:  Below the knee amputations.  Osteomyelitis. EXAM: MRI OF LOWER RIGHT EXTREMITY WITHOUT AND WITH CONTRAST TECHNIQUE: Multiplanar, multisequence MR imaging of the right below-the-knee amputation stump region was performed both before and after administration of intravenous contrast. CONTRAST:  36mL MULTIHANCE GADOBENATE DIMEGLUMINE 529 MG/ML IV SOLN COMPARISON:  03/30/2015 FINDINGS: Low-level edema and very subtle enhancement in the soft tissues just below the distal fibular margin and distal tibial margin on the right, with a slightly asymmetric 2.3 by 0.5 by 1.9 cm collection of fluid tracking around the lateral margin of the distal tibia anteriorly as shown on images 24-28 of series 3. There is a similar tiny fluid collection on the left but smaller. Both of these fluid collections do not I have enhancing margins and are probably not infected. Heterotopic ossification, low in signal intensity, noted spanning between the distal fibula and distal tibial margins. A drainable abscess is not seen. No compelling findings of osteomyelitis. Note is made of spurring in the knee joint. No knee effusion. Large field of view images include much of the distal thigh and accordingly this is  not a highly detailed joint study of the knee. IMPRESSION: 1. Subtle edema and very low grade infiltrative enhancement along the distal margin of the tibia and fibula on the right appears asymmetric and likely represents mild soft tissue inflammation. This could certainly reflect cellulitis. No gas observed in the soft tissues. Tiny amount of fluid tracks around the distal margin of the right tibia anterolaterally, slightly more prominent than on the left, but without enhancing margins to suggest infection/abscess. No osteomyelitis. 2. Osteoarthritis of both knees. Electronically Signed   By: Van Clines M.D.   On: 03/30/2015 19:13     Virginia Crews, MD 04/18/2015, 9:03 AM PGY-2, [place] Intern pager: [phone], text pages welcome

## 2015-04-19 ENCOUNTER — Inpatient Hospital Stay (HOSPITAL_COMMUNITY)
Admission: RE | Admit: 2015-04-19 | Discharge: 2015-04-27 | DRG: 560 | Disposition: A | Discharge status: Home Health | Payer: Medicaid Other | Source: Intra-hospital | Attending: Physical Medicine & Rehabilitation | Admitting: Physical Medicine & Rehabilitation

## 2015-04-19 DIAGNOSIS — I10 Essential (primary) hypertension: Secondary | ICD-10-CM | POA: Diagnosis not present

## 2015-04-19 DIAGNOSIS — F17210 Nicotine dependence, cigarettes, uncomplicated: Secondary | ICD-10-CM

## 2015-04-19 DIAGNOSIS — Z4781 Encounter for orthopedic aftercare following surgical amputation: Secondary | ICD-10-CM | POA: Diagnosis present

## 2015-04-19 DIAGNOSIS — D62 Acute posthemorrhagic anemia: Secondary | ICD-10-CM

## 2015-04-19 DIAGNOSIS — Z89512 Acquired absence of left leg below knee: Secondary | ICD-10-CM

## 2015-04-19 DIAGNOSIS — Z794 Long term (current) use of insulin: Secondary | ICD-10-CM

## 2015-04-19 DIAGNOSIS — Z89522 Acquired absence of left knee: Secondary | ICD-10-CM

## 2015-04-19 DIAGNOSIS — Z86718 Personal history of other venous thrombosis and embolism: Secondary | ICD-10-CM | POA: Diagnosis not present

## 2015-04-19 DIAGNOSIS — E1165 Type 2 diabetes mellitus with hyperglycemia: Secondary | ICD-10-CM | POA: Diagnosis not present

## 2015-04-19 DIAGNOSIS — F4321 Adjustment disorder with depressed mood: Secondary | ICD-10-CM | POA: Diagnosis present

## 2015-04-19 DIAGNOSIS — E1142 Type 2 diabetes mellitus with diabetic polyneuropathy: Secondary | ICD-10-CM

## 2015-04-19 DIAGNOSIS — Z79899 Other long term (current) drug therapy: Secondary | ICD-10-CM

## 2015-04-19 DIAGNOSIS — Z720 Tobacco use: Secondary | ICD-10-CM | POA: Diagnosis present

## 2015-04-19 DIAGNOSIS — E1310 Other specified diabetes mellitus with ketoacidosis without coma: Secondary | ICD-10-CM | POA: Diagnosis not present

## 2015-04-19 DIAGNOSIS — I96 Gangrene, not elsewhere classified: Secondary | ICD-10-CM

## 2015-04-19 DIAGNOSIS — I480 Paroxysmal atrial fibrillation: Secondary | ICD-10-CM | POA: Insufficient documentation

## 2015-04-19 DIAGNOSIS — Z7901 Long term (current) use of anticoagulants: Secondary | ICD-10-CM | POA: Diagnosis not present

## 2015-04-19 DIAGNOSIS — G629 Polyneuropathy, unspecified: Secondary | ICD-10-CM

## 2015-04-19 DIAGNOSIS — F1410 Cocaine abuse, uncomplicated: Secondary | ICD-10-CM | POA: Diagnosis present

## 2015-04-19 DIAGNOSIS — E876 Hypokalemia: Secondary | ICD-10-CM

## 2015-04-19 DIAGNOSIS — Z89611 Acquired absence of right leg above knee: Secondary | ICD-10-CM

## 2015-04-19 DIAGNOSIS — K5900 Constipation, unspecified: Secondary | ICD-10-CM

## 2015-04-19 DIAGNOSIS — E1100 Type 2 diabetes mellitus with hyperosmolarity without nonketotic hyperglycemic-hyperosmolar coma (NKHHC): Secondary | ICD-10-CM | POA: Diagnosis not present

## 2015-04-19 DIAGNOSIS — Z7982 Long term (current) use of aspirin: Secondary | ICD-10-CM

## 2015-04-19 DIAGNOSIS — Z9119 Patient's noncompliance with other medical treatment and regimen: Secondary | ICD-10-CM

## 2015-04-19 DIAGNOSIS — S78119A Complete traumatic amputation at level between unspecified hip and knee, initial encounter: Secondary | ICD-10-CM

## 2015-04-19 DIAGNOSIS — IMO0002 Reserved for concepts with insufficient information to code with codable children: Secondary | ICD-10-CM | POA: Diagnosis present

## 2015-04-19 HISTORY — DX: Adjustment disorder with depressed mood: F43.21

## 2015-04-19 LAB — GLUCOSE, CAPILLARY
GLUCOSE-CAPILLARY: 209 mg/dL — AB (ref 65–99)
GLUCOSE-CAPILLARY: 211 mg/dL — AB (ref 65–99)
Glucose-Capillary: 184 mg/dL — ABNORMAL HIGH (ref 65–99)
Glucose-Capillary: 177 mg/dL — ABNORMAL HIGH (ref 65–99)

## 2015-04-19 MED ORDER — DILTIAZEM HCL ER COATED BEADS 120 MG PO CP24
120.0000 mg | ORAL_CAPSULE | Freq: Every day | ORAL | Status: DC
  Administered 2015-04-20 – 2015-04-27 (×8): 120 mg via ORAL
  Filled 2015-04-19 (×9): qty 1

## 2015-04-19 MED ORDER — INSULIN ASPART 100 UNIT/ML ~~LOC~~ SOLN
6.0000 [IU] | Freq: Three times a day (TID) | SUBCUTANEOUS | Status: DC
  Administered 2015-04-20 – 2015-04-27 (×21): 6 [IU] via SUBCUTANEOUS

## 2015-04-19 MED ORDER — INSULIN ASPART 100 UNIT/ML ~~LOC~~ SOLN
0.0000 [IU] | Freq: Every day | SUBCUTANEOUS | Status: DC
  Administered 2015-04-21: 2 [IU] via SUBCUTANEOUS

## 2015-04-19 MED ORDER — NICOTINE 21 MG/24HR TD PT24
21.0000 mg | MEDICATED_PATCH | Freq: Every day | TRANSDERMAL | Status: DC
  Administered 2015-04-20 – 2015-04-27 (×8): 21 mg via TRANSDERMAL
  Filled 2015-04-19 (×8): qty 1

## 2015-04-19 MED ORDER — ACETAMINOPHEN 325 MG PO TABS
325.0000 mg | ORAL_TABLET | ORAL | Status: DC | PRN
  Administered 2015-04-20 – 2015-04-24 (×6): 650 mg via ORAL
  Filled 2015-04-19 (×6): qty 2

## 2015-04-19 MED ORDER — INSULIN GLARGINE 100 UNIT/ML ~~LOC~~ SOLN: 45.0000 [IU] | Freq: Every day | SUBCUTANEOUS | Status: DC

## 2015-04-19 MED ORDER — ASPIRIN EC 81 MG PO TBEC: 160.0000 mg | DELAYED_RELEASE_TABLET | Freq: Every day | ORAL | Status: DC

## 2015-04-19 MED ORDER — DIPHENHYDRAMINE HCL 12.5 MG/5ML PO ELIX: 12.5000 mg | ORAL_SOLUTION | Freq: Four times a day (QID) | ORAL | Status: DC | PRN

## 2015-04-19 MED ORDER — FOLIC ACID 1 MG PO TABS
1.0000 mg | ORAL_TABLET | Freq: Every day | ORAL | Status: DC
  Administered 2015-04-20 – 2015-04-27 (×8): 1 mg via ORAL
  Filled 2015-04-19 (×8): qty 1

## 2015-04-19 MED ORDER — ATORVASTATIN CALCIUM 40 MG PO TABS
40.0000 mg | ORAL_TABLET | Freq: Every day | ORAL | Status: DC
  Administered 2015-04-20 – 2015-04-27 (×8): 40 mg via ORAL
  Filled 2015-04-19 (×8): qty 1

## 2015-04-19 MED ORDER — METHOCARBAMOL 500 MG PO TABS
500.0000 mg | ORAL_TABLET | Freq: Four times a day (QID) | ORAL | Status: DC | PRN
  Administered 2015-04-19 – 2015-04-27 (×15): 500 mg via ORAL
  Filled 2015-04-19 (×15): qty 1

## 2015-04-19 MED ORDER — ASPIRIN 325 MG PO TABS
325.0000 mg | ORAL_TABLET | Freq: Every day | ORAL | Status: DC
  Administered 2015-04-20 – 2015-04-27 (×8): 325 mg via ORAL
  Filled 2015-04-19 (×8): qty 1

## 2015-04-19 MED ORDER — TRAZODONE HCL 50 MG PO TABS
25.0000 mg | ORAL_TABLET | Freq: Every evening | ORAL | Status: DC | PRN
  Administered 2015-04-20 – 2015-04-24 (×6): 50 mg via ORAL
  Filled 2015-04-19 (×6): qty 1

## 2015-04-19 MED ORDER — POLYETHYLENE GLYCOL 3350 17 G PO PACK
17.0000 g | PACK | Freq: Two times a day (BID) | ORAL | Status: DC
  Administered 2015-04-20 – 2015-04-24 (×9): 17 g via ORAL
  Filled 2015-04-19 (×11): qty 1

## 2015-04-19 MED ORDER — BISACODYL 10 MG RE SUPP
10.0000 mg | Freq: Every day | RECTAL | Status: DC | PRN
  Administered 2015-04-19: 10 mg via RECTAL
  Filled 2015-04-19: qty 1

## 2015-04-19 MED ORDER — OXYCODONE HCL 5 MG PO TABS
5.0000 mg | ORAL_TABLET | ORAL | Status: DC | PRN
  Administered 2015-04-19 – 2015-04-24 (×20): 10 mg via ORAL
  Administered 2015-04-24: 5 mg via ORAL
  Administered 2015-04-24 – 2015-04-27 (×14): 10 mg via ORAL
  Filled 2015-04-19 (×34): qty 2

## 2015-04-19 MED ORDER — GUAIFENESIN-DM 100-10 MG/5ML PO SYRP
5.0000 mL | ORAL_SOLUTION | Freq: Four times a day (QID) | ORAL | Status: DC | PRN
  Administered 2015-04-22: 10 mL via ORAL
  Filled 2015-04-19: qty 10

## 2015-04-19 MED ORDER — POLYSACCHARIDE IRON COMPLEX 150 MG PO CAPS
150.0000 mg | ORAL_CAPSULE | Freq: Every day | ORAL | Status: DC
  Administered 2015-04-20 – 2015-04-26 (×7): 150 mg via ORAL
  Filled 2015-04-19 (×7): qty 1

## 2015-04-19 MED ORDER — ADULT MULTIVITAMIN W/MINERALS CH
1.0000 | ORAL_TABLET | Freq: Every day | ORAL | Status: DC
  Administered 2015-04-20 – 2015-04-27 (×8): 1 via ORAL
  Filled 2015-04-19 (×8): qty 1

## 2015-04-19 MED ORDER — PROCHLORPERAZINE EDISYLATE 5 MG/ML IJ SOLN: 5.0000 mg | Freq: Four times a day (QID) | INTRAMUSCULAR | Status: DC | PRN

## 2015-04-19 MED ORDER — RIVAROXABAN 20 MG PO TABS
20.0000 mg | ORAL_TABLET | Freq: Every day | ORAL | Status: DC
  Administered 2015-04-20 – 2015-04-26 (×7): 20 mg via ORAL
  Filled 2015-04-19 (×7): qty 1

## 2015-04-19 MED ORDER — ALUM & MAG HYDROXIDE-SIMETH 200-200-20 MG/5ML PO SUSP: 30.0000 mL | ORAL | Status: DC | PRN

## 2015-04-19 MED ORDER — INSULIN GLARGINE 100 UNIT/ML ~~LOC~~ SOLN
45.0000 [IU] | Freq: Every day | SUBCUTANEOUS | Status: DC
  Administered 2015-04-20 – 2015-04-27 (×8): 45 [IU] via SUBCUTANEOUS
  Filled 2015-04-19 (×9): qty 0.45

## 2015-04-19 MED ORDER — VITAMIN B-1 100 MG PO TABS
100.0000 mg | ORAL_TABLET | Freq: Every day | ORAL | Status: DC
  Administered 2015-04-20 – 2015-04-27 (×8): 100 mg via ORAL
  Filled 2015-04-19 (×8): qty 1

## 2015-04-19 MED ORDER — LISINOPRIL 20 MG PO TABS
20.0000 mg | ORAL_TABLET | Freq: Every day | ORAL | Status: DC
  Administered 2015-04-20 – 2015-04-27 (×8): 20 mg via ORAL
  Filled 2015-04-19 (×8): qty 1

## 2015-04-19 MED ORDER — INSULIN ASPART 100 UNIT/ML ~~LOC~~ SOLN
0.0000 [IU] | Freq: Three times a day (TID) | SUBCUTANEOUS | Status: DC
  Administered 2015-04-20: 11 [IU] via SUBCUTANEOUS
  Administered 2015-04-20 – 2015-04-21 (×3): 4 [IU] via SUBCUTANEOUS
  Administered 2015-04-21 – 2015-04-22 (×2): 3 [IU] via SUBCUTANEOUS
  Administered 2015-04-22: 7 [IU] via SUBCUTANEOUS
  Administered 2015-04-23 – 2015-04-24 (×2): 4 [IU] via SUBCUTANEOUS
  Administered 2015-04-24: 3 [IU] via SUBCUTANEOUS
  Administered 2015-04-24: 4 [IU] via SUBCUTANEOUS
  Administered 2015-04-25 (×2): 3 [IU] via SUBCUTANEOUS
  Administered 2015-04-26: 4 [IU] via SUBCUTANEOUS
  Administered 2015-04-26: 3 [IU] via SUBCUTANEOUS

## 2015-04-19 MED ORDER — NICOTINE 21 MG/24HR TD PT24: 21.0000 mg | MEDICATED_PATCH | Freq: Every day | TRANSDERMAL | Status: DC

## 2015-04-19 MED ORDER — GABAPENTIN 300 MG PO CAPS
600.0000 mg | ORAL_CAPSULE | Freq: Three times a day (TID) | ORAL | Status: DC
  Administered 2015-04-19 – 2015-04-21 (×6): 600 mg via ORAL
  Filled 2015-04-19 (×5): qty 2
  Filled 2015-04-19: qty 6

## 2015-04-19 MED ORDER — METHOCARBAMOL 500 MG PO TABS: 500.0000 mg | ORAL_TABLET | Freq: Four times a day (QID) | ORAL | Status: DC | PRN

## 2015-04-19 MED ORDER — PROCHLORPERAZINE 25 MG RE SUPP: 12.5000 mg | Freq: Four times a day (QID) | RECTAL | Status: DC | PRN

## 2015-04-19 MED ORDER — HYDROXYZINE HCL 10 MG PO TABS
10.0000 mg | ORAL_TABLET | Freq: Three times a day (TID) | ORAL | Status: DC | PRN
  Administered 2015-04-19 – 2015-04-20 (×2): 10 mg via ORAL
  Filled 2015-04-19 (×3): qty 1

## 2015-04-19 MED ORDER — PANTOPRAZOLE SODIUM 40 MG PO TBEC
40.0000 mg | DELAYED_RELEASE_TABLET | Freq: Every day | ORAL | Status: DC
  Administered 2015-04-19 – 2015-04-26 (×8): 40 mg via ORAL
  Filled 2015-04-19 (×8): qty 1

## 2015-04-19 MED ORDER — PRO-STAT SUGAR FREE PO LIQD
30.0000 mL | Freq: Two times a day (BID) | ORAL | Status: DC
  Administered 2015-04-19 – 2015-04-27 (×16): 30 mL via ORAL
  Filled 2015-04-19 (×16): qty 30

## 2015-04-19 MED ORDER — PROCHLORPERAZINE MALEATE 5 MG PO TABS: 5.0000 mg | ORAL_TABLET | Freq: Four times a day (QID) | ORAL | Status: DC | PRN

## 2015-04-19 MED ORDER — FLEET ENEMA 7-19 GM/118ML RE ENEM: 1.0000 | ENEMA | Freq: Once | RECTAL | Status: DC | PRN

## 2015-04-19 NOTE — Clinical Social Work Note (Signed)
Clinical Social Work Assessment  Patient Details  Name: Steven [name] MRN: [id number] Date of Birth: May 03, 1961  Date of referral:  04/19/15               Reason for consult:  Facility Placement                Permission sought to share information with:  Chartered certified accountant granted to share information::  Yes, Verbal Permission Granted  Name::        Agency::  [facility]  Relationship::     Contact Information:     Housing/Transportation Living arrangements for the past 2 months:  Apartment Source of Information:  Patient Patient Interpreter Needed:  None Criminal Activity/Legal Involvement Pertinent to Current Situation/Hospitalization:    Significant Relationships:  Significant Other, Siblings Lives with:  Self Do you feel safe going back to the place where you live?  Yes Need for family participation in patient care:  No (Coment)  Care giving concerns:  Pt lives alone and needs PT after having revision of amputation   Social Worker assessment / plan:  CSW met with pt to discuss PT recommendation for rehab prior to returning home.  Employment status:  Disabled (Comment on whether or not currently receiving Disability) (receiving disability) Insurance information:  Medicaid In [place] PT Recommendations:  Inpatient Rehab Consult Information / Referral to community resources:  [place]  Patient/Family's Response to care:  Pt is agreeable to receiving rehab- he prefers CIR but is agreeable to SNF placement for short term rehab if inpatient rehab can not admit.  Patient/Family's Understanding of and Emotional Response to Diagnosis, Current Treatment, and Prognosis:  Pt has had several past amputations and is very familiar with rehab and recovery process- no questions or concerns at this time  Emotional Assessment Appearance:  Appears stated age Attitude/Demeanor/Rapport:    Affect (typically observed):    Orientation:     Alcohol / Substance use:  Illicit Drugs Psych involvement (Current and /or in the community):  No (Comment)  Discharge Needs  Concerns to be addressed:  Care Coordination Readmission within the last 30 days:  No Current discharge risk:  Physical Impairment, Lives alone Barriers to Discharge:  Continued Medical Work up   [employer], LCSW 04/19/2015, 11:00 AM

## 2015-04-19 NOTE — Progress Notes (Signed)
Inpatient Diabetes Program Recommendations  AACE/ADA: New Consensus Statement on Inpatient Glycemic Control (2015)  Target Ranges:  Prepandial:   less than 140 mg/dL      Peak postprandial:   less than 180 mg/dL (1-2 hours)      Critically ill patients:  140 - 180 mg/dL   Review of Glycemic Control  Results for BRAIDAN, RICCIARDI (MRN [id number]) as of 04/19/2015 14:07  Ref. Range 04/18/2015 12:05 04/18/2015 17:10 04/18/2015 21:35 04/19/2015 08:15 04/19/2015 12:23  Glucose-Capillary Latest Ref Range: 65-99 mg/dL 442 (H) 151 (H) 315 (H) 184 (H) 209 (H)   Needs meal coverage insulin -   Consider addition of meal coverage insulin - Novolog 4 units tidwc  Will continue to follow. Thank you. Lorenda Peck, RD, LDN, CDE Inpatient Diabetes Coordinator [phone]

## 2015-04-19 NOTE — Evaluation (Signed)
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  He is limited today by pain, but agreeable to EOB activity. Pt has the potential to return home at his baseline with pain management and intensive rehab.  Recommending CIR.  Will follow.    Follow Up Recommendations  CIR    Equipment Recommendations  None recommended by OT    Recommendations for Other Services       Precautions / Restrictions Precautions Precautions: Fall Precaution Comments: prostheses are at the hospital, but not the inserts Restrictions Weight Bearing Restrictions: No      Mobility Bed Mobility Overal bed mobility: Modified Independent             General bed mobility comments: with HOB up, pivoted to R side with use of rail, also able to achieve long sitting  Transfers                 General transfer comment: did not transfer, but able to scoot along EOB with min assist to reposition    Balance Overall balance assessment: Needs assistance Sitting-balance support: No upper extremity supported Sitting balance-Leahy Scale: Fair Sitting balance - Comments: able to sit unsupported with UEs free for ADL                                    ADL Overall ADL's : Needs assistance/impaired Eating/Feeding: Independent;Sitting   Grooming: Wash/dry hands;Wash/dry face;Sitting;Supervision/safety   Upper Body Bathing: Supervision/ safety;Sitting   Lower Body Bathing: Moderate assistance;Sitting/lateral leans   Upper Body Dressing : Supervision/safety;Sitting   Lower Body Dressing: Moderate assistance;Sitting/lateral  leans                 General ADL Comments: Pt declined OOB due to pain.     Vision     Perception     Praxis      Pertinent Vitals/Pain Pain Assessment: 0-10 Pain Score: 8  Pain Location: R LE Pain Descriptors / Indicators: Guarding;Grimacing;Aching Pain Intervention(s): Limited activity within patient's tolerance;Monitored during session;Premedicated before session;Repositioned     Hand Dominance Right   Extremity/Trunk Assessment Upper Extremity Assessment Upper Extremity Assessment: Generalized weakness (4+/5 B)   Lower Extremity Assessment Lower Extremity Assessment: Defer to PT evaluation   Cervical / Trunk Assessment Cervical / Trunk Assessment: Normal   Communication Communication Communication: No difficulties   Cognition Arousal/Alertness: Awake/alert Behavior During Therapy: WFL for tasks assessed/performed Overall Cognitive Status: Within Functional Limits for tasks assessed                     General Comments       Exercises       Shoulder Instructions      Home Living Family/patient expects to be discharged to:: Private residence Living Arrangements: Alone   Type of Home: Apartment Home Access: Ramped entrance     Home Layout: One level     Bathroom Shower/Tub: Teacher, early years/pre:  (same height as his w/c) Bathroom Accessibility: Yes How Accessible: Accessible via wheelchair Home Equipment: [place] - 2 wheels;Wheelchair -  power;Tub bench;Bedside commode          Prior Functioning/Environment Level of Independence: Independent with assistive device(s)  Gait / Transfers Assistance Needed: walked with bil BKA prostheses and RW just prior to adm; used w/c first thing in a.m. and before bed (transferring without prostheses either lateral scoot or anterior-posterior)     Comments: drove electric w/c to nearby store for groceries; used transportation services for appointments    OT Diagnosis: Generalized  weakness;Acute pain   OT Problem List: Decreased strength;Decreased activity tolerance;Impaired balance (sitting and/or standing);Pain   OT Treatment/Interventions: Self-care/ADL training;Therapeutic activities;Therapeutic exercise;Patient/family education;Balance training    OT Goals(Current goals can be found in the care plan section) Acute Rehab OT Goals Patient Stated Goal: return home alone with use of electric w/c OT Goal Formulation: With patient Time For Goal Achievement: 05/03/15 Potential to Achieve Goals: Good ADL Goals Pt Will Perform Grooming: with modified independence;sitting (at sink) Pt Will Perform Lower Body Bathing: with modified independence;sitting/lateral leans Pt Will Perform Lower Body Dressing: with modified independence;sitting/lateral leans Pt Will Transfer to Toilet: with modified independence;regular height toilet (lateral) Pt Will Perform Toileting - Clothing Manipulation and hygiene: with modified independence;sitting/lateral leans Pt Will Perform Tub/Shower Transfer: Tub transfer;with modified independence;tub bench Pt/caregiver will Perform Home Exercise Program: Both right and left upper extremity;With theraband;With written HEP provided (level 3)  OT Frequency: Min 3X/week   Barriers to D/C:            Co-evaluation              End of Session Nurse Communication:  (pt with questions about dressing change)  Activity Tolerance: Patient limited by pain Patient left: in bed;with call bell/phone within reach;with bed alarm set   Time: 1105-1136 OT Time Calculation (min): 31 min Charges:  OT General Charges $OT Visit: 1 Procedure OT Evaluation $Initial OT Evaluation Tier I: 1 Procedure OT Treatments $Self Care/Home Management : 8-22 mins G-Codes:    Malka So 04/19/2015, 11:58 AM  [phone]

## 2015-04-19 NOTE — Anesthesia Postprocedure Evaluation (Signed)
  Anesthesia Post-op Note  Patient: Steven [name]  Procedure(s) Performed: Procedure(s): AMPUTATION ABOVE KNEE, RIGHT (Right)  Patient Location: PACU  Anesthesia Type:General  Level of Consciousness: awake and sedated  Airway and Oxygen Therapy: Patient Spontanous Breathing  Post-op Pain: mild  Post-op Assessment: Post-op Vital signs reviewed and Patient's Cardiovascular Status Stable LLE Motor Response: Purposeful movement LLE Sensation: Full sensation RLE Motor Response: Purposeful movement RLE Sensation: Full sensation      Post-op Vital Signs: stable  Last Vitals:  Filed Vitals:   04/19/15 0500  BP: 166/100  Pulse: 87  Temp: 37.1 C  Resp: 18    Complications: No apparent anesthesia complications

## 2015-04-19 NOTE — PMR Pre-admission (Signed)
PMR Admission Coordinator Pre-Admission Assessment  Patient: Steven [name] is an 54 y.o., male MRN: [id number] DOB: 05-01-1961 Height: 6\' 4"  (193 cm) Weight: 90.7 kg (199 lb 15.3 oz) (03/30/15)              Insurance Information HMO:     PPO:       PCP:       IPA:       80/20:       OTHER:   PRIMARY: Medicaid [place] access      Policy#: [id number]      Subscriber: Donato Heinz CM Name:        Phone#:       Fax#:   Pre-Cert#:        Employer: Not employed Benefits:  Phone #: [phone]     Name: Automated Eff. 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 right handed male with history of hypertension, PAF maintained on xarelto, tobacco, alcohol abuse, polysubstance abuse, diabetes mellitus , peripheral neuropathy and unilateral BKA. Patient with recent right BKA 12/04/2013 due to gangrenous changes and received inpatient rehabilitation services 12/09/2013 until 12/18/2013. He was discharged to home with home health therapies. Lives alone uses bilateral BKA prosthesis provided by a biotech and rolling walker prior to admission in a one level apartment with a ramped entrance. He would use an electric wheelchair when out and about in the community. Presented 04/14/2015 with ischemic gangrenous changes of right below-knee amputation site. No relief with conservative care. A urine drug screen was positive for cocaine. 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 Sharol Given. Hospital course complicated by pain management.  Xarelto ongoing as prior to admission. Physical therapy evaluation completed with recommendations of physical medicine rehabilitation consult.    Past Medical History  Past Medical History  Diagnosis Date  . Uncontrolled diabetes mellitus ([place])   . HTN (hypertension)   . History of DVT (deep vein thrombosis)   . Personal history of diabetic foot ulcer   . Peripheral vascular disease ([place])     a. Abnl ABIs 2014.  Marland Kitchen Peripheral neuropathy ([place]) 11/19/2011  . Varicose vein     legs  . Osteomyelitis of foot, right, acute ([place]) 08/30/2012  . Personal history of colonic adenomas 05/29/2013  . History of cocaine use   . PAF (paroxysmal atrial fibrillation) ([place])     a. Dx 11/2013 in setting of sepsis/foot infection.  . Tobacco abuse   . H/O ETOH abuse   . Dysrhythmia     afib last admission  . Seasonal allergies     Family History  family history includes Diabetes in his mother; Stroke in his brother. There is no  history of Colon cancer.  Prior Rehab/Hospitalizations: Was on inpatient rehab after BKA 12/09/13 to 12/18/13  Has the patient had major surgery during 100 days prior to admission? No  Current Medications   Current facility-administered medications:  .  acetaminophen (TYLENOL) tablet 650 mg, 650 mg, Oral, Q6H PRN **OR** acetaminophen (TYLENOL) suppository 650 mg, 650 mg, Rectal, Q6H PRN, Meridee Score V, MD .  aspirin tablet 325 mg, 325 mg, Oral, Daily, Asiyah Cletis Media, MD, 325 mg at 04/19/15 0941 .  atorvastatin (LIPITOR) tablet 40 mg, 40 mg, Oral, Daily, Asiyah Cletis Media, MD, 40 mg at 04/19/15 0941 .  diltiazem (CARDIZEM CD) 24 hr capsule 120 mg, 120 mg, Oral, Daily, Asiyah Cletis Media, MD, 120 mg at 04/19/15 0940 .  folic acid (FOLVITE) tablet 1 mg, 1 mg, Oral, Daily, Asiyah Cletis Media, MD, 1 mg at 04/19/15 0941 .  gabapentin (NEURONTIN) capsule 600 mg, 600 mg, Oral, TID, Asiyah Cletis Media, MD, 600 mg at 04/19/15 0940 .  hydrOXYzine (ATARAX/VISTARIL) tablet 10 mg,  10 mg, Oral, TID PRN, Sela Hua, MD, 10 mg at 04/19/15 1011 .  insulin aspart (novoLOG) injection 0-15 Units, 0-15 Units, Subcutaneous, TID WC, Asiyah Cletis Media, MD, 5 Units at 04/19/15 1250 .  insulin aspart (novoLOG) injection 0-5 Units, 0-5 Units, Subcutaneous, QHS, Asiyah Cletis Media, MD, 4 Units at 04/18/15 2210 .  [START ON 04/20/2015] insulin glargine (LANTUS) injection 45 Units, 45 Units, Subcutaneous, Daily, Smiley Houseman, MD .  lisinopril (PRINIVIL,ZESTRIL) tablet 20 mg, 20 mg, Oral, Daily, Asiyah Cletis Media, MD, 20 mg at 04/19/15 0941 .  methocarbamol (ROBAXIN) tablet 500 mg, 500 mg, Oral, Q6H PRN, 500 mg at 04/19/15 1325 **OR** methocarbamol (ROBAXIN) 500 mg in dextrose 5 % 50 mL IVPB, 500 mg, Intravenous, Q6H PRN, Meridee Score V, MD .  metoCLOPramide (REGLAN) tablet 5-10 mg, 5-10 mg, Oral, Q8H PRN **OR** metoCLOPramide (REGLAN) injection 5-10 mg, 5-10 mg, Intravenous, Q8H PRN, Meridee Score V, MD .  multivitamin with minerals tablet 1 tablet, 1 tablet, Oral, Daily, Asiyah Cletis Media, MD, 1 tablet at 04/19/15 0941 .  nicotine (NICODERM CQ - dosed in mg/24 hours) patch 21 mg, 21 mg, Transdermal, Daily, Asiyah Cletis Media, MD, 21 mg at 04/19/15 0943 .  ondansetron (ZOFRAN) tablet 4 mg, 4 mg, Oral, Q6H PRN **OR** ondansetron (ZOFRAN) injection 4 mg, 4 mg, Intravenous, Q6H PRN, Meridee Score V, MD .  oxyCODONE-acetaminophen (PERCOCET/ROXICET) 5-325 MG per tablet 1-2 tablet, 1-2 tablet, Oral, Q4H PRN, Asiyah Cletis Media, MD, 2 tablet at 04/19/15 1312 .  pantoprazole (PROTONIX) EC tablet 40 mg, 40 mg, Oral, QHS, Asiyah Cletis Media, MD, 40 mg at 04/18/15 2210 .  polyethylene glycol (MIRALAX / GLYCOLAX) packet 17 g, 17 g, Oral, Daily, Elberta Leatherwood, MD, 17 g at 04/19/15 0940 .  rivaroxaban (XARELTO) tablet 20 mg, 20 mg, Oral, Daily, Asiyah Cletis Media, MD, 20 mg at 04/19/15 0941 .  thiamine (VITAMIN B-1) tablet 100 mg, 100 mg, Oral, Daily, 100 mg at 04/19/15 0941 **OR**  [DISCONTINUED] thiamine (B-1) injection 100 mg, 100 mg, Intravenous, Daily, Asiyah Cletis Media, MD  Patients Current Diet: Diet Carb Modified Fluid consistency:: Thin; Room service appropriate?: Yes  Precautions / Restrictions Precautions Precautions: Fall Precaution Comments: prostheses are at the hospital, but not the inserts Restrictions Weight Bearing Restrictions: No   Has the patient had 2 or more falls or a fall with injury in the past year?No  Prior Activity Level Limited Community (1-2x/wk): Went out to grocery  store in electric w/c weekly.  Walked with bilateral prosthetics.  Home Assistive Devices / Equipment Home Assistive Devices/Equipment: Wheelchair Home Equipment: Environmental consultant - 2 wheels, Wheelchair - power, Tub bench, Bedside commode  Prior Device Use: Indicate devices/aids used by the patient prior to current illness, exacerbation or injury? Motorized wheelchair or scooter, Building services engineer  Prior Functional Level Prior Function Level of Independence: Independent with assistive device(s) Gait / Transfers Assistance Needed: walked with bil BKA prostheses and RW just prior to adm; used w/c first thing in a.m. and before bed (transferring without prostheses either lateral scoot or anterior-posterior) Comments: drove electric w/c to nearby store for groceries; used transportation services for appointments  Self Care: Did the patient need help bathing, dressing, using the toilet or eating?  Independent  Indoor Mobility: Did the patient need assistance with walking from room to room (with or without device)? Independent  Stairs: Did the patient need assistance with internal or external stairs (with or without device)? Needed some help  Functional Cognition: Did the patient need help planning regular tasks such as shopping or remembering to take medications? Independent  Current Functional Level Cognition  Overall Cognitive Status: Within Functional Limits for tasks  assessed Orientation Level: Oriented to person, Oriented to place, Oriented to situation, Disoriented to time    Extremity Assessment (includes Sensation/Coordination)  Upper Extremity Assessment: Generalized weakness (4+/5 B)  Lower Extremity Assessment: Defer to PT evaluation RLE Deficits / Details: pt tolerated hip extension (lowering HOB) to -25 degrees; able to move AKA against gravity with incr time LLE Deficits / Details: Lt BKA with full ROM, strength WFL    ADLs  Overall ADL's : Needs assistance/impaired Eating/Feeding: Independent, Sitting Grooming: Wash/dry hands, Wash/dry face, Sitting, Supervision/safety Upper Body Bathing: Supervision/ safety, Sitting Lower Body Bathing: Moderate assistance, Sitting/lateral leans Upper Body Dressing : Supervision/safety, Sitting Lower Body Dressing: Moderate assistance, Sitting/lateral leans General ADL Comments: Pt declined OOB due to pain.    Mobility  Overal bed mobility: Modified Independent General bed mobility comments: with HOB up, pivoted to R side with use of rail, also able to achieve long sitting    Transfers  Overall transfer level: Needs assistance Equipment used: None Transfers: Government social research officer transfers: Min assist General transfer comment: did not transfer, but able to scoot along EOB with min assist to reposition    Ambulation / Gait / Stairs / Office manager / Balance Dynamic Sitting Balance Sitting balance - Comments: able to sit unsupported with UEs free for ADL Balance Overall balance assessment: Needs assistance Sitting-balance support: No upper extremity supported Sitting balance-Leahy Scale: Fair Sitting balance - Comments: able to sit unsupported with UEs free for ADL    Special needs/care consideration BiPAP/CPAP No CPM No Continuous Drip IV No Dialysis No        Life Vest No Oxygen No Special Bed No Trach Size No Wound Vac (area) No      Skin  New R AKA incision with dressing in place                              Bowel mgmt: Last BM 04/13/15 Bladder mgmt: Using a urinal Diabetic mgmt Yes, on oral medications and insulin at home    Previous Home Environment Living Arrangements: Alone Type of Home: Apartment Home Layout: One level Home Access: Ramped entrance Bathroom Shower/Tub: Chiropodist:  (same height as his  w/c) Bathroom Accessibility: Yes How Accessible: Accessible via wheelchair [place]: Yes  Discharge Living Setting Plans for Discharge Living Setting: Alone, Apartment (Lives alone.) Type of Home at Discharge: Apartment Discharge Home Layout: One level Discharge Home Access: Level entry Does the patient have any problems obtaining your medications?: No  Social/Family/Support Systems Patient Roles: Other (Comment) (Has a sister.  No children.) Contact Information: [name] - sister [phone] Anticipated Caregiver: self Ability/Limitations of Caregiver: Sister works. Caregiver Availability: Other (Comment) (Has mod I goals) Discharge Plan Discussed with Primary Caregiver: Yes Is Caregiver In Agreement with Plan?: Yes Does Caregiver/Family have Issues with Lodging/Transportation while Pt is in Rehab?: No  Goals/Additional Needs Patient/Family Goal for Rehab: PT/OT mod I goals Expected length of stay: 14-17 days Cultural Considerations: None Dietary Needs: Carb mod, med cal, thin liquids diet Equipment Needs: TBD Special Service Needs: Will need prosthetic adjustment since he has gone from a R BKA to a R AKA. Pt/Family Agrees to Admission and willing to participate: Yes Program Orientation Provided & Reviewed with Pt/Caregiver Including Roles  & Responsibilities: Yes  Decrease burden of Care through IP rehab admission: N/A  Possible need for SNF placement upon discharge: Not anticipated  Patient Condition: This patient's condition remains as documented in the consult  dated 04/19/15, in which the Rehabilitation Physician determined and documented that the patient's condition is appropriate for intensive rehabilitative care in an inpatient rehabilitation facility. Will admit to inpatient rehab today.  Preadmission Screen Completed By:  Retta Diones, 04/19/2015 4:04 PM ______________________________________________________________________   Discussed status with Dr. Posey Pronto on 04/19/15 at 1604 and received telephone approval for admission today.  Admission Coordinator:  Retta Diones, time1604/Date11/7/16

## 2015-04-19 NOTE — H&P (Addendum)
Expand All Collapse All      Physical Medicine and Rehabilitation Admission H&P     Chief Complaint   Patient presents with   .  R-BKA with gangrenous changes requiring revision to R-AKA    HPI: Steven [name] is a 54 y.o. male with history of HTN, PAF- on xarelto, polysubstance abuse--UDS positive for cocaine, DM type 2 with peripheral neuropathy, PAD with B-BKA who was admitted on 04/14/15 with 3 week history of severe pain and coolness of R-BKA site. 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  He had no relief with conservative care.  Dr. Sharol Given recommended revision to BKA.  Patient underwent R-AKA on 11/03 and Gabapentin increased to help manage neuropathic pain. PT/OT evaluations completed and CIR recommended for follow up therapy.   Review of Systems  HENT: Negative for congestion and hearing loss.   Eyes: Negative for blurred vision and double vision.  Respiratory: Positive for cough. Negative for shortness of breath and wheezing.   Cardiovascular: Negative for chest pain and palpitations.  Gastrointestinal: Positive for constipation (hasn't had a BM yet). Negative for heartburn, nausea and abdominal pain.  Genitourinary: Negative for dysuria and urgency.  Musculoskeletal: Positive for myalgias.  Neurological: Positive for sensory change (R-AKA hypersensitive to touch) and weakness. Negative for dizziness, tingling and headaches.  Psychiatric/Behavioral: The patient is nervous/anxious and has insomnia (up till 2 am and sleeps till 10 am daily).         Past Medical History   Diagnosis  Date   .  Uncontrolled diabetes mellitus ([place])     .  HTN (hypertension)     .  History of DVT (deep vein thrombosis)     .  Personal history of diabetic foot ulcer     .  Peripheral vascular disease ([place])         a. Abnl ABIs 2014.   Marland Kitchen  Peripheral neuropathy ([place])  11/19/2011   .  Varicose vein         legs   .  Osteomyelitis  of foot, right, acute ([place])  08/30/2012   .  Personal history of colonic adenomas  05/29/2013   .  History of cocaine use     .  PAF (paroxysmal atrial fibrillation) ([place])         a. Dx 11/2013 in setting of sepsis/foot infection.   .  Tobacco abuse     .  H/O ETOH abuse     .  Dysrhythmia         afib last admission   .  Seasonal allergies         Past Surgical History   Procedure  Laterality  Date   .  Amputation    04/21/2011       Procedure: AMPUTATION RAY;  Surgeon: Newt Minion, MD;  Location: [place];  Service: Orthopedics;  Laterality: Right;  Rt foot 2nd ray ampt   .  I&d extremity    05/03/2011       Procedure: IRRIGATION AND DEBRIDEMENT EXTREMITY;  Surgeon: Newt Minion, MD;  Location: [place];  Service: Orthopedics;  Laterality: Right;  Irrigation and Debridement Right Foot and Place Acell Xenograft   .  Toe amputation    04/24/2012       great toe   right foot   .  Amputation    04/24/2012       Procedure:  AMPUTATION RAY;  Surgeon: Newt Minion, MD;  Location: [place];  Service: Orthopedics;  Laterality: Right;  Right foot 1st ray amputation   .  Amputation  Left  04/23/2013       Procedure: AMPUTATION RAY;  Surgeon: Newt Minion, MD;  Location: [place];  Service: Orthopedics;  Laterality: Left;  Left Foot 5th Ray Amputation   .  Colonoscopy       .  Amputation  Left  11/21/2013       Procedure: AMPUTATION RAY;  Surgeon: Newt Minion, MD;  Location: [place];  Service: Orthopedics;  Laterality: Left;  Left Foot 1st & 2nd Ray Amputation   .  Amputation  Right  12/04/2013       Procedure: AMPUTATION BELOW KNEE;  Surgeon: Marianna Payment, MD;  Location: [place];  Service: Orthopedics;  Laterality: Right;   .  Amputation  Left  12/29/2013       Procedure: LEFT AMPUTATION BELOW KNEE;  Surgeon: Marianna Payment, MD;  Location: [place];  Service: Orthopedics;  Laterality: Left;   .  Amputation  Right  04/15/2015       Procedure: AMPUTATION ABOVE KNEE, RIGHT;  Surgeon: Newt Minion, MD;   Location: [place];  Service: Orthopedics;  Laterality: Right;       Family History   Problem  Relation  Age of Onset   .  Diabetes  Mother     .  Stroke  Brother     .  Colon cancer  Neg Hx       Social History:  Lives alone. Disabled due to medical issues.  Independent PTA--used Wheelchair early am and B-prosthesis with walker to ambulate during the day. He reports that he has been smoking Cigarettes--1/2 PPD.  He started smoking about 31 years ago. He has a 15 pack-year smoking history. He has never used smokeless tobacco. He reports that he drinks about 2 beers daily.  He reports that he does uses cocaine intermittently.     Allergies: No Known Allergies      Medications Prior to Admission   Medication  Sig  Dispense  Refill   .  acetaminophen (TYLENOL) 325 MG tablet  Take 2 tablets (650 mg total) by mouth every 6 (six) hours as needed.  60 tablet  1   .  aspirin 325 MG tablet  Take 325 mg by mouth daily.       .  cetirizine (ZYRTEC) 10 MG tablet  Take 10 mg by mouth daily.    0   .  diltiazem (CARDIZEM CD) 120 MG 24 hr capsule  Take 120 mg by mouth daily.       .  insulin aspart (NOVOLOG) 100 UNIT/ML injection  Inject 10-15 Units into the skin daily before supper.  1 vial  PRN   .  insulin glargine (LANTUS) 100 UNIT/ML injection  Inject 0.45 mLs (45 Units total) into the skin daily before breakfast.  10 mL  6   .  lisinopril (PRINIVIL,ZESTRIL) 20 MG tablet  Take 1 tablet (20 mg total) by mouth daily. For blood pressure  90 tablet  0   .  rivaroxaban (XARELTO) 20 MG TABS tablet  Take 1 tablet (20 mg total) by mouth daily with supper. For Atrial Fibrillation  90 tablet  1   .  atorvastatin (LIPITOR) 40 MG tablet  Take 1 tablet (40 mg total) by mouth daily. (Patient not taking: Reported on 04/14/2015)  30  tablet  11   .  fluticasone (FLONASE) 50 MCG/ACT nasal spray  Place 2 sprays into both nostrils daily. (Patient not taking: Reported on 04/14/2015)  16 g  1   .  folic acid (FOLVITE) 1 MG  tablet  Take 1 tablet (1 mg total) by mouth daily. (Patient not taking: Reported on 04/14/2015)  30 tablet  1   .  gabapentin (NEURONTIN) 300 MG capsule  Take 300mg  every morning, 300mg  every day at noon, and 600mg  every night at bedtime. (Patient not taking: Reported on 04/14/2015)  120 capsule  5   .  nitroGLYCERIN (NITRODUR - DOSED IN MG/24 HR) 0.2 mg/hr patch  Place 1 patch (0.2 mg total) onto the skin daily. (Patient not taking: Reported on 04/14/2015)  30 patch  5   .  oxyCODONE-acetaminophen (PERCOCET) 5-325 MG tablet  Take 1-2 tablets by mouth every 4 (four) hours as needed. (Patient not taking: Reported on 04/14/2015)  20 tablet  0   .  pantoprazole (PROTONIX) 40 MG tablet  Take 1 tablet (40 mg total) by mouth at bedtime. (Patient not taking: Reported on 04/14/2015)  90 tablet  1   .  [DISCONTINUED] cephALEXin (KEFLEX) 500 MG capsule  Take 1 capsule (500 mg total) by mouth 3 (three) times daily. (Patient not taking: Reported on 04/14/2015)  30 capsule  0     Home: Home Living Family/patient expects to be discharged to:: Private residence Living Arrangements: Alone Type of Home: Apartment Home Access: Ramped entrance [place]: One level Bathroom Shower/Tub: Chiropodist:  (same height as his w/c) Bathroom Accessibility: Yes Home Equipment: Environmental consultant - 2 wheels, Wheelchair - power, Tub bench, Bedside commode    Functional History: Prior Function Level of Independence: Independent with assistive device(s) Gait / Transfers Assistance Needed: walked with bil BKA prostheses and RW just prior to adm; used w/c first thing in a.m. and before bed (transferring without prostheses either lateral scoot or anterior-posterior) Comments: drove electric w/c to nearby store for groceries; used transportation services for appointments  Functional Status:  Mobility: Bed Mobility Overal bed mobility: Modified Independent General bed mobility comments: with HOB up, pivoted to R side  with use of rail, also able to achieve long sitting Transfers Overall transfer level: Needs assistance Equipment used: None Transfers: Government social research officer transfers: Min assist General transfer comment: did not transfer, but able to scoot along EOB with min assist to reposition      ADL: ADL Overall ADL's : Needs assistance/impaired Eating/Feeding: Independent, Sitting Grooming: Wash/dry hands, Wash/dry face, Sitting, Supervision/safety Upper Body Bathing: Supervision/ safety, Sitting Lower Body Bathing: Moderate assistance, Sitting/lateral leans Upper Body Dressing : Supervision/safety, Sitting Lower Body Dressing: Moderate assistance, Sitting/lateral leans General ADL Comments: Pt declined OOB due to pain.  Cognition: Cognition Overall Cognitive Status: Within Functional Limits for tasks assessed Orientation Level: Oriented to person, Oriented to place, Oriented to situation, Disoriented to time Cognition Arousal/Alertness: Awake/alert Behavior During Therapy: WFL for tasks assessed/performed Overall Cognitive Status: Within Functional Limits for tasks assessed   Blood pressure 140/73, pulse 71, temperature 97.9 F (36.6 C), temperature source Oral, resp. rate 18, height 6\' 4"  (1.93 m), weight 90.7 kg (199 lb 15.3 oz), SpO2 100 %. Physical Exam  Nursing note and vitals reviewed. Constitutional: He is oriented to person, place, and time. He appears well-developed and well-nourished.  HENT:   Head: Normocephalic and atraumatic.   Mouth/Throat: Oropharynx is clear and moist.  Eyes: EOM are normal. Pupils are equal,  round, and reactive to light. Right conjunctiva is injected. Left conjunctiva is injected.  Neck: Normal range of motion. Neck supple.  Cardiovascular: Normal rate and regular rhythm.   Respiratory: Effort normal and breath sounds normal. No respiratory distress. He has no wheezes.  GI: Soft. Bowel sounds are normal. He exhibits no  distension. There is no tenderness.  Musculoskeletal: He exhibits tenderness (R-AKA site).  L-BKA well healed and intact without breakdown.  Strength b/l UE: 5/5 throughout Right hip flexion 5/5 Left hip flexion, knee extension 5/5 Left BKA Right AKA   Neurological: He is alert and oriented to person, place, and time.  Skin: Skin is warm and dry.  Multiple healed lesions on back.    Post op dressing to right AKA. Left BKA is well healed  Psychiatric: He has a normal mood and affect. His speech is normal and behavior is normal. Cognition and memory are normal. He expresses impulsivity.       Lab Results Last 48 Hours    Results for orders placed or performed during the hospital encounter of 04/14/15 (from the past 48 hour(s))   Glucose, capillary     Status: Abnormal     Collection Time: 04/17/15  4:49 PM   Result  Value  Ref Range     Glucose-Capillary  310 (H)  65 - 99 mg/dL   Glucose, capillary     Status: Abnormal     Collection Time: 04/17/15  9:40 PM   Result  Value  Ref Range     Glucose-Capillary  282 (H)  65 - 99 mg/dL   Glucose, capillary     Status: Abnormal     Collection Time: 04/18/15  8:06 AM   Result  Value  Ref Range     Glucose-Capillary  225 (H)  65 - 99 mg/dL   Glucose, capillary     Status: Abnormal     Collection Time: 04/18/15 12:05 PM   Result  Value  Ref Range     Glucose-Capillary  442 (H)  65 - 99 mg/dL   Glucose, capillary     Status: Abnormal     Collection Time: 04/18/15  5:10 PM   Result  Value  Ref Range     Glucose-Capillary  151 (H)  65 - 99 mg/dL   Glucose, capillary     Status: Abnormal     Collection Time: 04/18/15  9:35 PM   Result  Value  Ref Range     Glucose-Capillary  315 (H)  65 - 99 mg/dL   Glucose, capillary     Status: Abnormal     Collection Time: 04/19/15  8:15 AM   Result  Value  Ref Range     Glucose-Capillary  184 (H)  65 - 99 mg/dL   Glucose, capillary     Status: Abnormal     Collection Time: 04/19/15 12:23 PM   Result   Value  Ref Range     Glucose-Capillary  209 (H)  65 - 99 mg/dL       Imaging Results (Last 48 hours)    No results found.    Medical Problem List and Plan: 1. 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  DVT Prophylaxis/Anticoagulation: Pharmaceutical: Xarelto 3. Pain Management:  Continue Oxycodone prn.   4. Mood: LCSW to follow for evaluation and support.   5. Neuropsych: This patient is capable of making decisions on his own behalf. 6. Skin/Wound Care: Routine pressure relief measures. Maintain  adequate nutrition and hydration status. Add protein supplement due to low protein stores.   7. Fluids/Electrolytes/Nutrition: Monitor I/O. Check lytes in am.   8. DM type 2: Poorly controlled due to non-compliance with lantus. Discussed importance of medication compliance to help with wound healing. Will monitor BS ac/hs. Continue Lantus daily with meal coverage.   9. HTN: Monitor BP bid--poorly controlled at this time and could be pain mediated. Continue Cardizem and lisinopril daily. Titrate medications as indicated.   10. PAF: Monitor HR bid and for any symptoms with activity. On cardizem CD dailyNo BB due to cocaine use. 11. ABLA: Add iron supplement. Recheck in am.   12. Constipation: Increase miralax to bid.  13. Hypokalemia: Likely dilutional. Will check labs in am.    Post Admission Physician Evaluation: Functional deficits secondary  to Revision of R-BKA to AKA. 1. Patient is admitted to receive collaborative, interdisciplinary care between the physiatrist, rehab nursing staff, and therapy team. 2. Patient's level of medical complexity and substantial therapy needs in context of that medical necessity cannot be provided at a lesser intensity of care such as a SNF. 3. Patient has experienced substantial functional loss from his/her baseline which was documented above under the "Functional History" and "Functional Status" headings.  Judging by the patient's diagnosis,  physical exam, and functional history, the patient has potential for functional progress which will result in measurable gains while on inpatient rehab.  These gains will be of substantial and practical use upon discharge  in facilitating mobility and self-care at the household level. 4. Physiatrist will provide 24 hour management of medical needs as well as oversight of the therapy plan/treatment and provide guidance as appropriate regarding the interaction of the two. 5. 24 hour rehab nursing will assist with safety, skin/wound care, disease management, medication administration, pain management and patient education and help integrate therapy concepts, techniques,education, etc. 6. PT will assess and treat for/with: Lower extremity strength, range of motion, stamina, balance, functional mobility, safety, adaptive techniques and equipment, wound care, coping skills, pain control, AKA pre-prosthetic training, education.   Goals are: Mod I. 7. OT will assess and treat for/with: ADL's, functional mobility, safety, upper extremity strength, adaptive techniques and equipment, wound mgt, ego support, and community reintegration.   Goals are: Ind/Mod I. Therapy may not proceed with showering this patient. 8. Case Management and Social Worker will assess and treat for psychological issues and discharge planning. 9. Team conference will be held weekly to assess progress toward goals and to determine barriers to discharge. 10. Patient will receive at least 3 hours of therapy per day at least 5 days per week. 11. ELOS: 14-17 days.        12. Prognosis: good  Delice Lesch, MD   04/19/2015

## 2015-04-19 NOTE — NC FL2 (Signed)
[place] LEVEL OF CARE SCREENING TOOL     IDENTIFICATION  Patient Name: Steven [name] Birthdate: [birth year] Sex: male Admission Date (Current Location): 04/14/2015  [place] and [place] Number: [place] [id number] [place] and Address:  [facility], [place] [address] [place], [place] [address]      Provider Number: [id number]  Attending Physician Name and Address:  Zenia Resides, MD  Relative Name and Phone Number:       Current Level of Care: SNF Recommended Level of Care: [place] Prior Approval Number:    Date Approved/Denied:   PASRR Number:   [id number]   Discharge Plan: SNF    Current Diagnoses: Patient Active Problem List   Diagnosis Date Noted  . Dry gangrene ([place])   . Right leg pain 04/14/2015  . Cough 08/12/2014  . Bilateral hand pain 05/29/2014  . Acne 03/30/2014  . S/P bilateral BKA (below knee amputation) ([place]) 01/22/2014  . Cocaine abuse 12/05/2013  . Tobacco abuse 12/04/2013  . Peripheral vascular disease ([place])   . Atrial fibrillation ([place]) 12/03/2013  . 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 History of colonic polyps 05/29/2013  . Diabetic retinopathy ([place]) 12/25/2012  . HLD (hyperlipidemia) 12/08/2012  . Peripheral neuropathy ([place]) 11/19/2011  . ETOH abuse 03/29/2011  . HTN (hypertension) 12/22/2010  . Diabetes mellitus type II, uncontrolled ([place]) 07/18/2006  . VENOUS INSUFFICIENCY 07/18/2006    Orientation ACTIVITIES/SOCIAL BLADDER RESPIRATION    Self, Time, Situation, Place    Continent Normal  BEHAVIORAL SYMPTOMS/MOOD NEUROLOGICAL BOWEL NUTRITION STATUS      Continent Diet (carb modified)  PHYSICIAN VISITS COMMUNICATION OF NEEDS Height & Weight Skin    Verbally 6\' 4"  (193 cm) 199 lbs. Surgical wounds          AMBULATORY STATUS RESPIRATION    Assist extensive Normal      Personal Care Assistance Level of Assistance  Bathing, Dressing Bathing Assistance: Limited  assistance   Dressing Assistance: Limited assistance      Functional Limitations Info                SPECIAL CARE FACTORS FREQUENCY  PT (By licensed PT)     PT Frequency: 5/wk             Additional Factors Info  Code Status, Insulin Sliding Scale Code Status Info: DNR     Insulin Sliding Scale Info: 4/day       Current Medications (04/19/2015): Current Facility-Administered Medications  Medication Dose Route Frequency Provider Last Rate Last Dose  . acetaminophen (TYLENOL) tablet 650 mg  650 mg Oral Q6H PRN Newt Minion, MD       Or  . acetaminophen (TYLENOL) suppository 650 mg  650 mg Rectal Q6H PRN Newt Minion, MD      . aspirin tablet 325 mg  325 mg Oral Daily Asiyah Cletis Media, MD   325 mg at 04/19/15 0941  . atorvastatin (LIPITOR) tablet 40 mg  40 mg Oral Daily Asiyah Cletis Media, MD   40 mg at 04/19/15 0941  . diltiazem (CARDIZEM CD) 24 hr capsule 120 mg  120 mg Oral Daily Asiyah Cletis Media, MD   120 mg at 04/19/15 0940  . folic acid (FOLVITE) tablet 1 mg  1 mg Oral Daily Asiyah Cletis Media, MD   1 mg at 04/19/15 0941  . gabapentin (NEURONTIN) capsule 600 mg  600 mg Oral TID Asiyah Cletis Media, MD   600 mg at 04/19/15 0940  .  hydrOXYzine (ATARAX/VISTARIL) tablet 10 mg  10 mg Oral TID PRN Sela Hua, MD   10 mg at 04/19/15 1011  . insulin aspart (novoLOG) injection 0-15 Units  0-15 Units Subcutaneous TID WC Asiyah Cletis Media, MD   3 Units at 04/19/15 [phone]  . insulin aspart (novoLOG) injection 0-5 Units  0-5 Units Subcutaneous QHS Asiyah Cletis Media, MD   4 Units at 04/18/15 2210  . insulin glargine (LANTUS) injection 37 Units  37 Units Subcutaneous Daily Virginia Crews, MD   37 Units at 04/19/15 [phone]  . lisinopril (PRINIVIL,ZESTRIL) tablet 20 mg  20 mg Oral Daily Asiyah Cletis Media, MD   20 mg at 04/19/15 0941  . methocarbamol (ROBAXIN) tablet 500 mg  500 mg Oral Q6H PRN Newt Minion, MD   500 mg at 04/18/15 1254   Or  . methocarbamol  (ROBAXIN) 500 mg in dextrose 5 % 50 mL IVPB  500 mg Intravenous Q6H PRN Newt Minion, MD      . metoCLOPramide (REGLAN) tablet 5-10 mg  5-10 mg Oral Q8H PRN Newt Minion, MD       Or  . metoCLOPramide (REGLAN) injection 5-10 mg  5-10 mg Intravenous Q8H PRN Newt Minion, MD      . multivitamin with minerals tablet 1 tablet  1 tablet Oral Daily Asiyah Cletis Media, MD   1 tablet at 04/19/15 0941  . nicotine (NICODERM CQ - dosed in mg/24 hours) patch 21 mg  21 mg Transdermal Daily Asiyah Cletis Media, MD   21 mg at 04/19/15 0943  . ondansetron (ZOFRAN) tablet 4 mg  4 mg Oral Q6H PRN Newt Minion, MD       Or  . ondansetron [facility]) injection 4 mg  4 mg Intravenous Q6H PRN Newt Minion, MD      . oxyCODONE-acetaminophen (PERCOCET/ROXICET) 5-325 MG per tablet 1-2 tablet  1-2 tablet Oral Q4H PRN Asiyah Cletis Media, MD   2 tablet at 04/19/15 0837  . pantoprazole (PROTONIX) EC tablet 40 mg  40 mg Oral QHS Asiyah Cletis Media, MD   40 mg at 04/18/15 2210  . polyethylene glycol (MIRALAX / GLYCOLAX) packet 17 g  17 g Oral Daily Elberta Leatherwood, MD   17 g at 04/19/15 0940  . rivaroxaban (XARELTO) tablet 20 mg  20 mg Oral Daily Asiyah Cletis Media, MD   20 mg at 04/19/15 0941  . thiamine (VITAMIN B-1) tablet 100 mg  100 mg Oral Daily Asiyah Cletis Media, MD   100 mg at 04/19/15 0941   Do not use this list as official medication orders. Please verify with discharge summary.  Discharge Medications:   Medication List    ASK your doctor about these medications        acetaminophen 325 MG tablet  Commonly known as:  TYLENOL  Take 2 tablets (650 mg total) by mouth every 6 (six) hours as needed.     aspirin 325 MG tablet  Take 325 mg by mouth daily.     atorvastatin 40 MG tablet  Commonly known as:  LIPITOR  Take 1 tablet (40 mg total) by mouth daily.     cetirizine 10 MG tablet  Commonly known as:  ZYRTEC  Take 10 mg by mouth daily.     diltiazem 120 MG 24 hr capsule  Commonly known as:   CARDIZEM CD  Take 120 mg by mouth daily.     fluticasone 50 MCG/ACT nasal spray  Commonly  known as:  FLONASE  Place 2 sprays into both nostrils daily.     folic acid 1 MG tablet  Commonly known as:  FOLVITE  Take 1 tablet (1 mg total) by mouth daily.     gabapentin 300 MG capsule  Commonly known as:  NEURONTIN  Take 300mg  every morning, 300mg  every day at noon, and 600mg  every night at bedtime.     insulin aspart 100 UNIT/ML injection  Commonly known as:  novoLOG  Inject 10-15 Units into the skin daily before supper.     insulin glargine 100 UNIT/ML injection  Commonly known as:  LANTUS  Inject 0.45 mLs (45 Units total) into the skin daily before breakfast.     lisinopril 20 MG tablet  Commonly known as:  PRINIVIL,ZESTRIL  Take 1 tablet (20 mg total) by mouth daily. For blood pressure     nitroGLYCERIN 0.2 mg/hr patch  Commonly known as:  NITRODUR - Dosed in mg/24 hr  Place 1 patch (0.2 mg total) onto the skin daily.     oxyCODONE-acetaminophen 5-325 MG tablet  Commonly known as:  PERCOCET  Take 1-2 tablets by mouth every 4 (four) hours as needed.     pantoprazole 40 MG tablet  Commonly known as:  PROTONIX  Take 1 tablet (40 mg total) by mouth at bedtime.     rivaroxaban 20 MG Tabs tablet  Commonly known as:  XARELTO  Take 1 tablet (20 mg total) by mouth daily with supper. For Atrial Fibrillation        Relevant Imaging Results:  Relevant Lab Results:  Recent Labs    Additional Information    Cranford Mon, LCSW

## 2015-04-19 NOTE — H&P (View-Only) (Signed)
Expand All Collapse All      Physical Medicine and Rehabilitation Admission H&P     Chief Complaint   Patient presents with   .  R-BKA with gangrenous changes requiring revision to R-AKA    HPI: Steven [name] is a 54 y.o. male with history of HTN, PAF- on xarelto, polysubstance abuse--UDS positive for cocaine, DM type 2 with peripheral neuropathy, PAD with B-BKA who was admitted on 04/14/15 with 3 week history of severe pain and coolness of R-BKA site. 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  He had no relief with conservative care.  Dr. Sharol Given recommended revision to BKA.  Patient underwent R-AKA on 11/03 and Gabapentin increased to help manage neuropathic pain. PT/OT evaluations completed and CIR recommended for follow up therapy.   Review of Systems  HENT: Negative for congestion and hearing loss.   Eyes: Negative for blurred vision and double vision.  Respiratory: Positive for cough. Negative for shortness of breath and wheezing.   Cardiovascular: Negative for chest pain and palpitations.  Gastrointestinal: Positive for constipation (hasn't had a BM yet). Negative for heartburn, nausea and abdominal pain.  Genitourinary: Negative for dysuria and urgency.  Musculoskeletal: Positive for myalgias.  Neurological: Positive for sensory change (R-AKA hypersensitive to touch) and weakness. Negative for dizziness, tingling and headaches.  Psychiatric/Behavioral: The patient is nervous/anxious and has insomnia (up till 2 am and sleeps till 10 am daily).         Past Medical History   Diagnosis  Date   .  Uncontrolled diabetes mellitus ([place])     .  HTN (hypertension)     .  History of DVT (deep vein thrombosis)     .  Personal history of diabetic foot ulcer     .  Peripheral vascular disease ([place])         a. Abnl ABIs 2014.   Marland Kitchen  Peripheral neuropathy ([place])  11/19/2011   .  Varicose vein         legs   .  Osteomyelitis  of foot, right, acute ([place])  08/30/2012   .  Personal history of colonic adenomas  05/29/2013   .  History of cocaine use     .  PAF (paroxysmal atrial fibrillation) ([place])         a. Dx 11/2013 in setting of sepsis/foot infection.   .  Tobacco abuse     .  H/O ETOH abuse     .  Dysrhythmia         afib last admission   .  Seasonal allergies         Past Surgical History   Procedure  Laterality  Date   .  Amputation    04/21/2011       Procedure: AMPUTATION RAY;  Surgeon: Newt Minion, MD;  Location: [facility];  Service: Orthopedics;  Laterality: Right;  Rt foot 2nd ray ampt   .  I&d extremity    05/03/2011       Procedure: IRRIGATION AND DEBRIDEMENT EXTREMITY;  Surgeon: Newt Minion, MD;  Location: [facility];  Service: Orthopedics;  Laterality: Right;  Irrigation and Debridement Right Foot and Place Acell Xenograft   .  Toe amputation    04/24/2012       great toe   right foot   .  Amputation    04/24/2012       Procedure:  AMPUTATION RAY;  Surgeon: Newt Minion, MD;  Location: [facility];  Service: Orthopedics;  Laterality: Right;  Right foot 1st ray amputation   .  Amputation  Left  04/23/2013       Procedure: AMPUTATION RAY;  Surgeon: Newt Minion, MD;  Location: [facility];  Service: Orthopedics;  Laterality: Left;  Left Foot 5th Ray Amputation   .  Colonoscopy       .  Amputation  Left  11/21/2013       Procedure: AMPUTATION RAY;  Surgeon: Newt Minion, MD;  Location: [facility];  Service: Orthopedics;  Laterality: Left;  Left Foot 1st & 2nd Ray Amputation   .  Amputation  Right  12/04/2013       Procedure: AMPUTATION BELOW KNEE;  Surgeon: Marianna Payment, MD;  Location: [facility];  Service: Orthopedics;  Laterality: Right;   .  Amputation  Left  12/29/2013       Procedure: LEFT AMPUTATION BELOW KNEE;  Surgeon: Marianna Payment, MD;  Location: [facility];  Service: Orthopedics;  Laterality: Left;   .  Amputation  Right  04/15/2015       Procedure: AMPUTATION ABOVE KNEE, RIGHT;  Surgeon: Newt Minion, MD;   Location: [facility];  Service: Orthopedics;  Laterality: Right;       Family History   Problem  Relation  Age of Onset   .  Diabetes  Mother     .  Stroke  Brother     .  Colon cancer  Neg Hx       Social History:  Lives alone. Disabled due to medical issues.  Independent PTA--used Wheelchair early am and B-prosthesis with walker to ambulate during the day. He reports that he has been smoking Cigarettes--1/2 PPD.  He started smoking about 31 years ago. He has a 15 pack-year smoking history. He has never used smokeless tobacco. He reports that he drinks about 2 beers daily.  He reports that he does uses cocaine intermittently.     Allergies: No Known Allergies      Medications Prior to Admission   Medication  Sig  Dispense  Refill   .  acetaminophen (TYLENOL) 325 MG tablet  Take 2 tablets (650 mg total) by mouth every 6 (six) hours as needed.  60 tablet  1   .  aspirin 325 MG tablet  Take 325 mg by mouth daily.       .  cetirizine (ZYRTEC) 10 MG tablet  Take 10 mg by mouth daily.    0   .  diltiazem (CARDIZEM CD) 120 MG 24 hr capsule  Take 120 mg by mouth daily.       .  insulin aspart (NOVOLOG) 100 UNIT/ML injection  Inject 10-15 Units into the skin daily before supper.  1 vial  PRN   .  insulin glargine (LANTUS) 100 UNIT/ML injection  Inject 0.45 mLs (45 Units total) into the skin daily before breakfast.  10 mL  6   .  lisinopril (PRINIVIL,ZESTRIL) 20 MG tablet  Take 1 tablet (20 mg total) by mouth daily. For blood pressure  90 tablet  0   .  rivaroxaban (XARELTO) 20 MG TABS tablet  Take 1 tablet (20 mg total) by mouth daily with supper. For Atrial Fibrillation  90 tablet  1   .  atorvastatin (LIPITOR) 40 MG tablet  Take 1 tablet (40 mg total) by mouth daily. (Patient not taking: Reported on 04/14/2015)  30  tablet  11   .  fluticasone (FLONASE) 50 MCG/ACT nasal spray  Place 2 sprays into both nostrils daily. (Patient not taking: Reported on 04/14/2015)  16 g  1   .  folic acid (FOLVITE) 1 MG  tablet  Take 1 tablet (1 mg total) by mouth daily. (Patient not taking: Reported on 04/14/2015)  30 tablet  1   .  gabapentin (NEURONTIN) 300 MG capsule  Take 300mg  every morning, 300mg  every day at noon, and 600mg  every night at bedtime. (Patient not taking: Reported on 04/14/2015)  120 capsule  5   .  nitroGLYCERIN (NITRODUR - DOSED IN MG/24 HR) 0.2 mg/hr patch  Place 1 patch (0.2 mg total) onto the skin daily. (Patient not taking: Reported on 04/14/2015)  30 patch  5   .  oxyCODONE-acetaminophen (PERCOCET) 5-325 MG tablet  Take 1-2 tablets by mouth every 4 (four) hours as needed. (Patient not taking: Reported on 04/14/2015)  20 tablet  0   .  pantoprazole (PROTONIX) 40 MG tablet  Take 1 tablet (40 mg total) by mouth at bedtime. (Patient not taking: Reported on 04/14/2015)  90 tablet  1   .  [DISCONTINUED] cephALEXin (KEFLEX) 500 MG capsule  Take 1 capsule (500 mg total) by mouth 3 (three) times daily. (Patient not taking: Reported on 04/14/2015)  30 capsule  0     Home: Home Living Family/patient expects to be discharged to:: Private residence Living Arrangements: Alone Type of Home: Apartment Home Access: Ramped entrance [place]: One level Bathroom Shower/Tub: Chiropodist:  (same height as his w/c) Bathroom Accessibility: Yes Home Equipment: Environmental consultant - 2 wheels, Wheelchair - power, Tub bench, Bedside commode    Functional History: Prior Function Level of Independence: Independent with assistive device(s) Gait / Transfers Assistance Needed: walked with bil BKA prostheses and RW just prior to adm; used w/c first thing in a.m. and before bed (transferring without prostheses either lateral scoot or anterior-posterior) Comments: drove electric w/c to nearby store for groceries; used transportation services for appointments  Functional Status:  Mobility: Bed Mobility Overal bed mobility: Modified Independent General bed mobility comments: with HOB up, pivoted to R side  with use of rail, also able to achieve long sitting Transfers Overall transfer level: Needs assistance Equipment used: None Transfers: Government social research officer transfers: Min assist General transfer comment: did not transfer, but able to scoot along EOB with min assist to reposition      ADL: ADL Overall ADL's : Needs assistance/impaired Eating/Feeding: Independent, Sitting Grooming: Wash/dry hands, Wash/dry face, Sitting, Supervision/safety Upper Body Bathing: Supervision/ safety, Sitting Lower Body Bathing: Moderate assistance, Sitting/lateral leans Upper Body Dressing : Supervision/safety, Sitting Lower Body Dressing: Moderate assistance, Sitting/lateral leans General ADL Comments: Pt declined OOB due to pain.  Cognition: Cognition Overall Cognitive Status: Within Functional Limits for tasks assessed Orientation Level: Oriented to person, Oriented to place, Oriented to situation, Disoriented to time Cognition Arousal/Alertness: Awake/alert Behavior During Therapy: WFL for tasks assessed/performed Overall Cognitive Status: Within Functional Limits for tasks assessed   Blood pressure 140/73, pulse 71, temperature 97.9 F (36.6 C), temperature source Oral, resp. rate 18, height 6\' 4"  (1.93 m), weight 90.7 kg (199 lb 15.3 oz), SpO2 100 %. Physical Exam  Nursing note and vitals reviewed. Constitutional: He is oriented to person, place, and time. He appears well-developed and well-nourished.  HENT:   Head: Normocephalic and atraumatic.   Mouth/Throat: Oropharynx is clear and moist.  Eyes: EOM are normal. Pupils are equal,  round, and reactive to light. Right conjunctiva is injected. Left conjunctiva is injected.  Neck: Normal range of motion. Neck supple.  Cardiovascular: Normal rate and regular rhythm.   Respiratory: Effort normal and breath sounds normal. No respiratory distress. He has no wheezes.  GI: Soft. Bowel sounds are normal. He exhibits no  distension. There is no tenderness.  Musculoskeletal: He exhibits tenderness (R-AKA site).  L-BKA well healed and intact without breakdown.  Strength b/l UE: 5/5 throughout Right hip flexion 5/5 Left hip flexion, knee extension 5/5 Left BKA Right AKA   Neurological: He is alert and oriented to person, place, and time.  Skin: Skin is warm and dry.  Multiple healed lesions on back.    Post op dressing to right AKA. Left BKA is well healed  Psychiatric: He has a normal mood and affect. His speech is normal and behavior is normal. Cognition and memory are normal. He expresses impulsivity.       Lab Results Last 48 Hours    Results for orders placed or performed during the hospital encounter of 04/14/15 (from the past 48 hour(s))   Glucose, capillary     Status: Abnormal     Collection Time: 04/17/15  4:49 PM   Result  Value  Ref Range     Glucose-Capillary  310 (H)  65 - 99 mg/dL   Glucose, capillary     Status: Abnormal     Collection Time: 04/17/15  9:40 PM   Result  Value  Ref Range     Glucose-Capillary  282 (H)  65 - 99 mg/dL   Glucose, capillary     Status: Abnormal     Collection Time: 04/18/15  8:06 AM   Result  Value  Ref Range     Glucose-Capillary  225 (H)  65 - 99 mg/dL   Glucose, capillary     Status: Abnormal     Collection Time: 04/18/15 12:05 PM   Result  Value  Ref Range     Glucose-Capillary  442 (H)  65 - 99 mg/dL   Glucose, capillary     Status: Abnormal     Collection Time: 04/18/15  5:10 PM   Result  Value  Ref Range     Glucose-Capillary  151 (H)  65 - 99 mg/dL   Glucose, capillary     Status: Abnormal     Collection Time: 04/18/15  9:35 PM   Result  Value  Ref Range     Glucose-Capillary  315 (H)  65 - 99 mg/dL   Glucose, capillary     Status: Abnormal     Collection Time: 04/19/15  8:15 AM   Result  Value  Ref Range     Glucose-Capillary  184 (H)  65 - 99 mg/dL   Glucose, capillary     Status: Abnormal     Collection Time: 04/19/15 12:23 PM   Result   Value  Ref Range     Glucose-Capillary  209 (H)  65 - 99 mg/dL       Imaging Results (Last 48 hours)    No results found.    Medical Problem List and Plan: 1. Functional deficits secondary to Revision of L-BKA to AKA 2.  DVT Prophylaxis/Anticoagulation: Pharmaceutical: Xarelto 3. Pain Management:  Continue Oxycodone prn.   4. Mood: LCSW to follow for evaluation and support.   5. Neuropsych: This patient is capable of making decisions on his own behalf. 6. Skin/Wound Care: Routine pressure relief measures. Maintain  adequate nutrition and hydration status. Add protein supplement due to low protein stores.   7. Fluids/Electrolytes/Nutrition: Monitor I/O. Check lytes in am.   8. DM type 2: Poorly controlled due to non-compliance with lantus. Discussed importance of medication compliance to help with wound healing. Will monitor BS ac/hs. Continue Lantus daily with meal coverage.   9. HTN: Monitor BP bid--poorly controlled at this time and could be pain mediated. Continue Cardizem and lisinopril daily. Titrate medications as indicated.   10. PAF: Monitor HR bid and for any symptoms with activity. On cardizem CD dailyNo BB due to cocaine use. 11. ABLA: Add iron supplement. Recheck in am.   12. Constipation: Increase miralax to bid.  13. Hypokalemia: Likely dilutional. Will check labs in am.    Post Admission Physician Evaluation: Functional deficits secondary  to Revision of L-BKA to AKA. 1. Patient is admitted to receive collaborative, interdisciplinary care between the physiatrist, rehab nursing staff, and therapy team. 2. Patient's level of medical complexity and substantial therapy needs in context of that medical necessity cannot be provided at a lesser intensity of care such as a SNF. 3. Patient has experienced substantial functional loss from his/her baseline which was documented above under the "Functional History" and "Functional Status" headings.  Judging by the patient's diagnosis,  physical exam, and functional history, the patient has potential for functional progress which will result in measurable gains while on inpatient rehab.  These gains will be of substantial and practical use upon discharge  in facilitating mobility and self-care at the household level. 4. Physiatrist will provide 24 hour management of medical needs as well as oversight of the therapy plan/treatment and provide guidance as appropriate regarding the interaction of the two. 5. 24 hour rehab nursing will assist with safety, skin/wound care, disease management, medication administration, pain management and patient education and help integrate therapy concepts, techniques,education, etc. 6. PT will assess and treat for/with: Lower extremity strength, range of motion, stamina, balance, functional mobility, safety, adaptive techniques and equipment, wound care, coping skills, pain control, AKA pre-prosthetic training, education.   Goals are: Mod I. 7. OT will assess and treat for/with: ADL's, functional mobility, safety, upper extremity strength, adaptive techniques and equipment, wound mgt, ego support, and community reintegration.   Goals are: Ind/Mod I. Therapy may not proceed with showering this patient. 8. Case Management and Social Worker will assess and treat for psychological issues and discharge planning. 9. Team conference will be held weekly to assess progress toward goals and to determine barriers to discharge. 10. Patient will receive at least 3 hours of therapy per day at least 5 days per week. 11. ELOS: 14-17 days.        12. Prognosis: good  Delice Lesch, MD   04/19/2015

## 2015-04-19 NOTE — Progress Notes (Signed)
CSW informed by [employer] that pt will DC to CIR- no CSW needs at this time  CSW signing off  Domenica Reamer, [place] Social Worker [phone]

## 2015-04-19 NOTE — Progress Notes (Signed)
Patient ID: DUNCAN ALEJANDRO, male   DOB: 1960-11-19, 54 y.o.   MRN: [id number] Patient admitted to 4W01 via bed, escorted by nursing staff.  Patient verbalized understanding of rehab process, signed fall safety agreement.  Patient had incontinent episode which soaked bed, looked like he was laying in it for a while due to some dried areas on the sheets.  Bathed patient and changed bed linen.  Patient appears to be in no immediate distress at this time.  Brita Romp, RN

## 2015-04-19 NOTE — Progress Notes (Signed)
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 right handed male with history of hypertension, PAF maintained on xarelto, tobacco, alcohol abuse, polysubstance abuse, diabetes mellitus , peripheral neuropathy and unilateral BKA. Patient with recent right BKA 12/04/2013 due to gangrenous changes and received inpatient rehabilitation services 12/09/2013 until 12/18/2013. He was discharged to home with home health therapies. Lives alone uses bilateral BKA prosthesis provided by a biotech and rolling walker prior to admission in a one level apartment with a ramped entrance. He would use an electric wheelchair when out and about in the community. Presented 04/14/2015 with ischemic gangrenous changes of right below-knee amputation site. No relief with conservative care. A urine drug screen was positive for cocaine. 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 Sharol Given. Hospital course complicated by pain management. Xarelto ongoing as prior to admission. Physical therapy evaluation completed with recommendations of physical medicine rehabilitation consult.   Review of Systems  Constitutional: Negative for fever and chills.  HENT: Negative for hearing loss.  Eyes: Negative for blurred vision and double vision.  Respiratory: Positive for cough. Negative for shortness of breath.  Gastrointestinal: Positive for constipation. Negative for nausea and vomiting.  Genitourinary: Negative for dysuria and hematuria.  Musculoskeletal: Positive for myalgias.  Skin: Negative for rash.  Neurological: Negative for focal weakness and headaches.  All  other systems reviewed and are negative.  Past Medical History  Diagnosis Date  . Uncontrolled diabetes mellitus ([place])   . HTN (hypertension)   . History of DVT (deep vein thrombosis)   . Personal history of diabetic foot ulcer   . Peripheral vascular disease ([place])     a. Abnl ABIs 2014.  Marland Kitchen Peripheral neuropathy ([place]) 11/19/2011  . Varicose vein     legs  . Osteomyelitis of foot, right, acute ([place]) 08/30/2012  . Personal history of colonic adenomas 05/29/2013  . History of cocaine use   . PAF (paroxysmal atrial fibrillation) ([place])     a. Dx 11/2013 in setting of sepsis/foot infection.  . Tobacco abuse   . H/O ETOH abuse   . Dysrhythmia     afib last admission  . Seasonal allergies    Past Surgical History  Procedure Laterality Date  . Amputation  04/21/2011    Procedure: AMPUTATION RAY; Surgeon: Newt Minion, MD; Location: [facility]; Service: Orthopedics; Laterality: Right; Rt foot 2nd ray ampt  . I&d extremity  05/03/2011    Procedure: IRRIGATION AND DEBRIDEMENT EXTREMITY; Surgeon: Newt Minion, MD; Location: [facility]; Service: Orthopedics; Laterality: Right; Irrigation and Debridement Right Foot and Place Acell Xenograft  . Toe amputation  04/24/2012    great toe right foot  . Amputation  04/24/2012    Procedure: AMPUTATION RAY; Surgeon: Newt Minion, MD; Location: [facility]; Service: Orthopedics; Laterality: Right; Right foot 1st ray amputation  . Amputation Left 04/23/2013    Procedure: AMPUTATION RAY; Surgeon: Newt Minion, MD; Location: [facility]; Service: Orthopedics; Laterality: Left; Left Foot 5th Ray Amputation  . Colonoscopy    . Amputation Left 11/21/2013    Procedure: AMPUTATION RAY; Surgeon: Newt Minion, MD; Location: [facility]; Service: Orthopedics; Laterality:  Left; Left Foot 1st & 2nd Ray Amputation  . Amputation Right 12/04/2013     Procedure: AMPUTATION BELOW KNEE; Surgeon: Marianna Payment, MD; Location: [facility]; Service: Orthopedics; Laterality: Right;  . Amputation Left 12/29/2013    Procedure: LEFT AMPUTATION BELOW KNEE; Surgeon: Marianna Payment, MD; Location: [facility]; Service: Orthopedics; Laterality: Left;  . Amputation Right 04/15/2015    Procedure: AMPUTATION ABOVE KNEE, RIGHT; Surgeon: Newt Minion, MD; Location: [facility]; Service: Orthopedics; Laterality: Right;   Family History  Problem Relation Age of Onset  . Diabetes Mother   . Stroke Brother   . Colon cancer Neg Hx    Social History:  reports that he has been smoking Cigarettes. He started smoking about 31 years ago. He has a 15 pack-year smoking history. He has never used smokeless tobacco. He reports that he drinks about 3.0 - 3.6 oz of alcohol per week. He reports that he does not use illicit drugs. Allergies: No Known Allergies Medications Prior to Admission  Medication Sig Dispense Refill  . acetaminophen (TYLENOL) 325 MG tablet Take 2 tablets (650 mg total) by mouth every 6 (six) hours as needed. 60 tablet 1  . aspirin 325 MG tablet Take 325 mg by mouth daily.    . cetirizine (ZYRTEC) 10 MG tablet Take 10 mg by mouth daily.  0  . diltiazem (CARDIZEM CD) 120 MG 24 hr capsule Take 120 mg by mouth daily.    . insulin aspart (NOVOLOG) 100 UNIT/ML injection Inject 10-15 Units into the skin daily before supper. 1 vial PRN  . insulin glargine (LANTUS) 100 UNIT/ML injection Inject 0.45 mLs (45 Units total) into the skin daily before breakfast. 10 mL 6  . lisinopril (PRINIVIL,ZESTRIL) 20 MG tablet Take 1 tablet (20 mg total) by mouth daily. For blood pressure 90 tablet 0  . rivaroxaban (XARELTO) 20 MG TABS tablet Take 1 tablet (20 mg total) by mouth daily with supper. For Atrial Fibrillation 90 tablet 1  . atorvastatin (LIPITOR) 40 MG tablet Take 1 tablet (40 mg total) by  mouth daily. (Patient not taking: Reported on 04/14/2015) 30 tablet 11  . fluticasone (FLONASE) 50 MCG/ACT nasal spray Place 2 sprays into both nostrils daily. (Patient not taking: Reported on 29/09/7652) 16 g 1  . folic acid (FOLVITE) 1 MG tablet Take 1 tablet (1 mg total) by mouth daily. (Patient not taking: Reported on 04/14/2015) 30 tablet 1  . gabapentin (NEURONTIN) 300 MG capsule Take 300mg  every morning, 300mg  every day at noon, and 600mg  every night at bedtime. (Patient not taking: Reported on 04/14/2015) 120 capsule 5  . nitroGLYCERIN (NITRODUR - DOSED IN MG/24 HR) 0.2 mg/hr patch Place 1 patch (0.2 mg total) onto the skin daily. (Patient not taking: Reported on 04/14/2015) 30 patch 5  . oxyCODONE-acetaminophen (PERCOCET) 5-325 MG tablet Take 1-2 tablets by mouth every 4 (four) hours as needed. (Patient not taking: Reported on 04/14/2015) 20 tablet 0  . pantoprazole (PROTONIX) 40 MG tablet Take 1 tablet (40 mg total) by mouth at bedtime. (Patient not taking: Reported on 04/14/2015) 90 tablet 1  . [DISCONTINUED] cephALEXin (KEFLEX) 500 MG capsule Take 1 capsule (500 mg total) by mouth 3 (three) times daily. (Patient not taking: Reported on 04/14/2015) 30 capsule 0    Home: Home Living Family/patient expects to be discharged to:: Private residence Living Arrangements: Alone Type of Home: Apartment Home Access: Ramped entrance [place]: One level Bathroom Shower/Tub: Chiropodist: (same height as his w/c) Bathroom  Accessibility: Yes Home Equipment: Walker - 2 wheels, Wheelchair - power, Tub bench, Bedside commode  Functional History: Prior Function Level of Independence: Independent with assistive device(s) Gait / Transfers Assistance Needed: walked with bil BKA prostheses and RW just prior to adm; used w/c first thing in a.m. and before bed (transferring without prostheses either lateral scoot or anterior-posterior) Comments: drove  electric w/c to nearby store for groceries; used transportation services for appointments Functional Status:  Mobility: Bed Mobility Overal bed mobility: Modified Independent General bed mobility comments: with HOB up, pivoted to R side with use of rail, also able to achieve long sitting Transfers Overall transfer level: Needs assistance Equipment used: None Transfers: Government social research officer transfers: Min assist General transfer comment: did not transfer, but able to scoot along EOB with min assist to reposition      ADL: ADL Overall ADL's : Needs assistance/impaired Eating/Feeding: Independent, Sitting Grooming: Wash/dry hands, Wash/dry face, Sitting, Supervision/safety Upper Body Bathing: Supervision/ safety, Sitting Lower Body Bathing: Moderate assistance, Sitting/lateral leans Upper Body Dressing : Supervision/safety, Sitting Lower Body Dressing: Moderate assistance, Sitting/lateral leans General ADL Comments: Pt declined OOB due to pain.  Cognition: Cognition Overall Cognitive Status: Within Functional Limits for tasks assessed Orientation Level: Oriented to person, Oriented to place, Oriented to situation, Disoriented to time Cognition Arousal/Alertness: Awake/alert Behavior During Therapy: WFL for tasks assessed/performed Overall Cognitive Status: Within Functional Limits for tasks assessed  Blood pressure 166/100, pulse 87, temperature 98.8 F (37.1 C), temperature source Oral, resp. rate 18, height 6\' 4"  (1.93 m), weight 90.7 kg (199 lb 15.3 oz), SpO2 99 %. Physical Exam  Vitals reviewed. Constitutional: He is oriented to person, place, and time. He appears well-developed and well-nourished.  HENT:  Head: Normocephalic and atraumatic.  Eyes: Conjunctivae and EOM are normal.  Neck: Normal range of motion. Neck supple. No thyromegaly present.  Cardiovascular:  Cardiac rate controlled  Respiratory: Effort normal and breath sounds normal.  No respiratory distress.  GI: Soft. Bowel sounds are normal. He exhibits no distension.  Musculoskeletal: He exhibits edema and tenderness.  Strength b/l UE: 5/5 throughout Right hip flexion 5/5 Left hip flexion, knee extension 5/5 Left BKA Right AKA  Neurological: He is alert and oriented to person, place, and time. He exhibits normal muscle tone.  Skin: Skin is warm and dry.  Post op dressing to right AKA. Left BKA is well healed  Psychiatric: He has a normal mood and affect. His behavior is normal.     Lab Results Last 24 Hours    Results for orders placed or performed during the hospital encounter of 04/14/15 (from the past 24 hour(s))  Glucose, capillary Status: Abnormal   Collection Time: 04/18/15 5:10 PM  Result Value Ref Range   Glucose-Capillary 151 (H) 65 - 99 mg/dL  Glucose, capillary Status: Abnormal   Collection Time: 04/18/15 9:35 PM  Result Value Ref Range   Glucose-Capillary 315 (H) 65 - 99 mg/dL  Glucose, capillary Status: Abnormal   Collection Time: 04/19/15 8:15 AM  Result Value Ref Range   Glucose-Capillary 184 (H) 65 - 99 mg/dL  Glucose, capillary Status: Abnormal   Collection Time: 04/19/15 12:23 PM  Result Value Ref Range   Glucose-Capillary 209 (H) 65 - 99 mg/dL      Imaging Results (Last 48 hours)    No results found.    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 Records reviewed and summated above. PT/OT for mobility, ADL's, strengthening,and wheelchair training  Clean amputation daily with soap and water Monitor incision site for signs of infection or impending skin breakdown. Staples to remain in place for 3-4 weeks Stump shrinker, for edema control  Scar mobilization massaging to prevent soft tissue adherence Stump protector during therapies Prevent flexion contractures by implementing the following:   Encourage prone lying for 20-30 mins per day BID to avoid hip flexion contractures if medically appropriate; Avoid pillow under knees when patient is lying in bed in order to prevent both knee and hip flexion contractures; Avoid prolonged sitting Post surgical pain control with oral medication Phantom limb pain control with physical modalities including desensitization techniques (gentle self massage to the residual stump,hot packs if sensation iintact, [place]) and mirror therapy, TENS. If ineffective, consider pharmacological treatment for neuropathic pain (e.g gabapentin, pregabalin, amytriptalyine, duloxetine).  When using wheelchair, patient should have knee on amputated side fully extended with board under the seat cushion. Avoid injury to contralateral side  1. Does the need for close, 24 hr/day medical supervision in concert with the patient's rehab needs make it unreasonable for this patient to be served in a less intensive setting? Yes  2. Co-Morbidities requiring supervision/potential complications: HTN (monitor and provide prns in accordance with increased physical exertion and pain), PAF (Cont meds), tobacco (cont counseling to promote wound healing), alcohol abuse, polysubstance abuse (Cont counseling), DM (Monitor in accordance with exercise and adjust meds as necessary), peripheral neuropathy, unilateral BKA 3. Due to safety, skin/wound care, disease management, medication administration, pain management and patient education, does the patient require 24 hr/day rehab nursing? Yes 4. Does the patient require coordinated care of a physician, rehab nurse, PT (1.5-2 hrs/day, 5 days/week) and OT (1.5-2 hrs/day, 5 days/week) to address physical and functional deficits in the context of the above medical diagnosis(es)? Yes Addressing deficits in the following areas: balance, endurance, locomotion, strength, transferring, bathing, dressing, toileting and  psychosocial support 5. Can the patient actively participate in an intensive therapy program of at least 3 hrs of therapy per day at least 5 days per week? Yes 6. The potential for patient to make measurable gains while on inpatient rehab is good 7. Anticipated functional outcomes upon discharge from inpatient rehab are modified independent with PT, independent and modified independent with OT, n/a with SLP. 8. Estimated rehab length of stay to reach the above functional goals is: 14-17 days. 9. Does the patient have adequate social supports and living environment to accommodate these discharge functional goals? Potentially 10. Anticipated D/C setting: Home 11. Anticipated post D/C treatments: HH therapy and Home excercise program 12. Overall Rehab/Functional Prognosis: good  RECOMMENDATIONS: This patient's condition is appropriate for continued rehabilitative care in the following setting: CIR Patient has agreed to participate in recommended program. Yes Note that insurance prior authorization may be required for reimbursement for recommended care.  Comment: Rehab Admissions Coordinator to follow up.   Delice Lesch, MD 04/19/2015       Revision History     Date/Time User Provider Type Action   04/19/2015 2:36 PM Steven Lorie Phenix, MD Physician Sign   04/19/2015 1:28 PM Cathlyn Parsons, PA-C Physician Assistant Pend   View Details Report       Routing History     Date/Time From To Method   04/19/2015 2:36 PM Steven Lorie Phenix, MD Steven Lorie Phenix, MD In [facility]   04/19/2015 2:36 PM Steven Lorie Phenix, MD Leeanne Rio, MD In [facility]

## 2015-04-19 NOTE — Progress Notes (Signed)
Retta Diones, RN Rehab Admission Coordinator Signed Physical Medicine and Rehabilitation PMR Pre-admission 04/19/2015 3:54 PM  Related encounter: ED to Hosp-Admission (Current) from 04/14/2015 in [place] All Collapse All   PMR Admission Coordinator Pre-Admission Assessment  Patient: Steven [name] is an 54 y.o., male MRN: [id number] DOB: 1960/10/30 Height: 6\' 4"  (193 cm) Weight: 90.7 kg (199 lb 15.3 oz) (03/30/15)  Insurance Information HMO: PPO: PCP: IPA: 80/20: OTHER:  PRIMARY: Medicaid [place] access Policy#: [id number] Subscriber: Donato Heinz CM Name: Phone#: Fax#:  Pre-Cert#: Employer: Not employed Benefits: Phone #: [phone] Name: Automated Eff. 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 right handed male with history of hypertension, PAF maintained on xarelto, tobacco, alcohol abuse, polysubstance abuse, diabetes mellitus , peripheral neuropathy and unilateral BKA. Patient with recent right BKA 12/04/2013 due to gangrenous changes and received inpatient rehabilitation services 12/09/2013 until 12/18/2013. He was discharged to home with home health therapies. Lives alone uses bilateral BKA prosthesis provided by a biotech  and rolling walker prior to admission in a one level apartment with a ramped entrance. He would use an electric wheelchair when out and about in the community. Presented 04/14/2015 with ischemic gangrenous changes of right below-knee amputation site. No relief with conservative care. A urine drug screen was positive for cocaine. 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 Sharol Given. Hospital course complicated by pain management. Xarelto ongoing as prior to admission. Physical therapy evaluation completed with recommendations of physical medicine rehabilitation consult.   Past Medical History  Past Medical History  Diagnosis Date  . Uncontrolled diabetes mellitus ([place])   . HTN (hypertension)   . History of DVT (deep vein thrombosis)   . Personal history of diabetic foot ulcer   . Peripheral vascular disease ([place])     a. Abnl ABIs 2014.  Marland Kitchen Peripheral neuropathy ([place]) 11/19/2011  . Varicose vein     legs  . Osteomyelitis of foot, right, acute ([place]) 08/30/2012  . Personal history of colonic adenomas 05/29/2013  . History of cocaine use   . PAF (paroxysmal atrial fibrillation) ([place])     a. Dx 11/2013 in setting of sepsis/foot infection.  . Tobacco abuse   . H/O ETOH abuse   . Dysrhythmia     afib last admission  . Seasonal allergies     Family History  family history includes Diabetes in his mother; Stroke in his brother. There is no history of Colon cancer.  Prior Rehab/Hospitalizations: Was on inpatient rehab after BKA 12/09/13 to 12/18/13  Has the patient had major surgery during 100 days prior to admission? No  Current Medications   Current facility-administered medications:  . acetaminophen (TYLENOL) tablet 650 mg, 650 mg, Oral, Q6H PRN **OR** acetaminophen (TYLENOL) suppository 650 mg, 650 mg, Rectal, Q6H PRN, Meridee Score V, MD . aspirin tablet 325 mg, 325 mg, Oral, Daily, Asiyah Cletis Media, MD, 325 mg at 04/19/15 0941 .  atorvastatin (LIPITOR) tablet 40 mg, 40 mg, Oral, Daily, Asiyah Cletis Media, MD, 40 mg at 04/19/15 0941 .  diltiazem (CARDIZEM CD) 24 hr capsule 120 mg, 120 mg, Oral, Daily, Asiyah Cletis Media, MD, 120 mg at 04/19/15 0940 . folic acid (FOLVITE) tablet 1 mg, 1 mg, Oral, Daily, Asiyah Cletis Media, MD, 1 mg at 04/19/15 0941 . gabapentin (NEURONTIN) capsule 600 mg, 600 mg, Oral, TID, Asiyah Cletis Media, MD, 600 mg at 04/19/15 0940 . hydrOXYzine (ATARAX/VISTARIL) tablet 10 mg, 10 mg, Oral, TID PRN, Sela Hua, MD, 10 mg at 04/19/15 1011 . insulin aspart (novoLOG) injection 0-15 Units, 0-15 Units, Subcutaneous, TID WC, Asiyah Cletis Media, MD, 5 Units at 04/19/15 1250 . insulin aspart (novoLOG) injection 0-5 Units, 0-5 Units, Subcutaneous, QHS, Asiyah Cletis Media, MD, 4 Units at 04/18/15 2210 . [START ON 04/20/2015] insulin glargine (LANTUS) injection 45 Units, 45 Units, Subcutaneous, Daily, Smiley Houseman, MD . lisinopril (PRINIVIL,ZESTRIL) tablet 20 mg, 20 mg, Oral, Daily, Asiyah Cletis Media, MD, 20 mg at 04/19/15 0941 . methocarbamol (ROBAXIN) tablet 500 mg, 500 mg, Oral, Q6H PRN, 500 mg at 04/19/15 1325 **OR** methocarbamol (ROBAXIN) 500 mg in dextrose 5 % 50 mL IVPB, 500 mg, Intravenous, Q6H PRN, Meridee Score V, MD . metoCLOPramide (REGLAN) tablet 5-10 mg, 5-10 mg, Oral, Q8H PRN **OR** metoCLOPramide (REGLAN) injection 5-10 mg, 5-10 mg, Intravenous, Q8H PRN, Meridee Score V, MD . multivitamin with minerals tablet 1 tablet, 1 tablet, Oral, Daily, Asiyah Cletis Media, MD, 1 tablet at 04/19/15 0941 . nicotine (NICODERM CQ - dosed in mg/24 hours) patch 21 mg, 21 mg, Transdermal, Daily, Asiyah Cletis Media, MD, 21 mg at 04/19/15 0943 . ondansetron (ZOFRAN) tablet 4 mg, 4 mg, Oral, Q6H PRN **OR** ondansetron (ZOFRAN) injection 4 mg, 4 mg, Intravenous, Q6H PRN, Meridee Score V, MD . oxyCODONE-acetaminophen (PERCOCET/ROXICET) 5-325 MG per tablet 1-2 tablet, 1-2 tablet, Oral, Q4H PRN,  Asiyah Cletis Media, MD, 2 tablet at 04/19/15 1312 . pantoprazole (PROTONIX) EC tablet 40 mg, 40 mg, Oral, QHS, Asiyah Cletis Media, MD, 40 mg at 04/18/15 2210 . polyethylene glycol (MIRALAX / GLYCOLAX) packet 17 g, 17 g, Oral, Daily, Elberta Leatherwood, MD, 17 g at 04/19/15 0940 . rivaroxaban (XARELTO) tablet 20 mg, 20 mg, Oral, Daily, Asiyah Cletis Media, MD, 20 mg at 04/19/15 0941 . thiamine (VITAMIN B-1) tablet 100 mg, 100 mg, Oral, Daily, 100 mg at 04/19/15 0941 **OR** [DISCONTINUED] thiamine (B-1) injection 100 mg, 100 mg, Intravenous, Daily, Asiyah Cletis Media, MD  Patients Current Diet: Diet Carb Modified Fluid consistency:: Thin; Room service appropriate?: Yes  Precautions / Restrictions Precautions Precautions: Fall Precaution Comments: prostheses are at the hospital, but not the inserts Restrictions Weight Bearing Restrictions: No   Has the patient had 2 or more falls or a fall with injury in the past year?No  Prior Activity Level Limited Community (1-2x/wk): Went out to grocery store in IT trainer w/c weekly. Walked with bilateral prosthetics.  Home Assistive Devices / Equipment Home Assistive Devices/Equipment: Wheelchair Home Equipment: Environmental consultant - 2 wheels, Wheelchair - power, Tub bench, Bedside commode  Prior Device Use: Indicate devices/aids used by the patient prior to current illness, exacerbation or injury? Motorized wheelchair or scooter, Building services engineer  Prior Functional Level Prior Function Level of Independence: Independent with assistive device(s) Gait / Transfers Assistance Needed: walked with bil BKA prostheses and RW just prior to adm; used w/c first thing in a.m. and before bed (transferring without prostheses either lateral scoot or anterior-posterior) Comments: drove electric w/c to nearby store for groceries; used transportation services for appointments  Self Care: Did the patient need help bathing,  dressing, using the toilet or  eating? Independent  Indoor Mobility: Did the patient need assistance with walking from room to room (with or without device)? Independent  Stairs: Did the patient need assistance with internal or external stairs (with or without device)? Needed some help  Functional Cognition: Did the patient need help planning regular tasks such as shopping or remembering to take medications? Independent  Current Functional Level Cognition  Overall Cognitive Status: Within Functional Limits for tasks assessed Orientation Level: Oriented to person, Oriented to place, Oriented to situation, Disoriented to time   Extremity Assessment (includes Sensation/Coordination)  Upper Extremity Assessment: Generalized weakness (4+/5 B)  Lower Extremity Assessment: Defer to PT evaluation RLE Deficits / Details: pt tolerated hip extension (lowering HOB) to -25 degrees; able to move AKA against gravity with incr time LLE Deficits / Details: Lt BKA with full ROM, strength WFL    ADLs  Overall ADL's : Needs assistance/impaired Eating/Feeding: Independent, Sitting Grooming: Wash/dry hands, Wash/dry face, Sitting, Supervision/safety Upper Body Bathing: Supervision/ safety, Sitting Lower Body Bathing: Moderate assistance, Sitting/lateral leans Upper Body Dressing : Supervision/safety, Sitting Lower Body Dressing: Moderate assistance, Sitting/lateral leans General ADL Comments: Pt declined OOB due to pain.    Mobility  Overal bed mobility: Modified Independent General bed mobility comments: with HOB up, pivoted to R side with use of rail, also able to achieve long sitting    Transfers  Overall transfer level: Needs assistance Equipment used: None Transfers: Government social research officer transfers: Min assist General transfer comment: did not transfer, but able to scoot along EOB with min assist to reposition    Ambulation / Gait / Stairs / Office manager  / Balance Dynamic Sitting Balance Sitting balance - Comments: able to sit unsupported with UEs free for ADL Balance Overall balance assessment: Needs assistance Sitting-balance support: No upper extremity supported Sitting balance-Leahy Scale: Fair Sitting balance - Comments: able to sit unsupported with UEs free for ADL    Special needs/care consideration BiPAP/CPAP No CPM No Continuous Drip IV No Dialysis No  Life Vest No Oxygen No Special Bed No Trach Size No Wound Vac (area) No  Skin New R AKA incision with dressing in place  Bowel mgmt: Last BM 04/13/15 Bladder mgmt: Using a urinal Diabetic mgmt Yes, on oral medications and insulin at home    Previous Home Environment Living Arrangements: Alone Type of Home: Apartment Home Layout: One level Home Access: Ramped entrance Bathroom Shower/Tub: Chiropodist: (same height as his w/c) Bathroom Accessibility: Yes How Accessible: Accessible via wheelchair Home Care Services: Yes  Discharge Living Setting Plans for Discharge Living Setting: Alone, Apartment (Lives alone.) Type of Home at Discharge: Apartment Discharge Home Layout: One level Discharge Home Access: Level entry Does the patient have any problems obtaining your medications?: No  Social/Family/Support Systems Patient Roles: Other (Comment) (Has a sister. No children.) Contact Information: [name] - sister [phone] Anticipated Caregiver: self Ability/Limitations of Caregiver: Sister works. Caregiver Availability: Other (Comment) (Has mod I goals) Discharge Plan Discussed with Primary Caregiver: Yes Is Caregiver In Agreement with Plan?: Yes Does Caregiver/Family have Issues with Lodging/Transportation while Pt is in Rehab?: No  Goals/Additional Needs Patient/Family Goal for Rehab: PT/OT mod I goals Expected length of stay: 14-17 days Cultural Considerations: None Dietary Needs: Carb  mod, med cal, thin liquids diet Equipment Needs: TBD Special Service Needs: Will need prosthetic adjustment since he has gone from a R BKA to a R AKA. Pt/Family Agrees  to Admission and willing to participate: Yes Program Orientation Provided & Reviewed with Pt/Caregiver Including Roles & Responsibilities: Yes  Decrease burden of Care through IP rehab admission: N/A  Possible need for SNF placement upon discharge: Not anticipated  Patient Condition: This patient's condition remains as documented in the consult dated 04/19/15, in which the Rehabilitation Physician determined and documented that the patient's condition is appropriate for intensive rehabilitative care in an inpatient rehabilitation facility. Will admit to inpatient rehab today.  Preadmission Screen Completed By: Retta Diones, 04/19/2015 4:04 PM ______________________________________________________________________  Discussed status with Dr. Posey Pronto on 04/19/15 at 1604 and received telephone approval for admission today.  Admission Coordinator: Retta Diones, time1604/Date11/7/16          Cosigned by: [name], MD at 04/19/2015 4:10 PM  Revision History     Date/Time User Provider Type Action   04/19/2015 4:10 PM [name], MD Physician Cosign   04/19/2015 4:04 PM Retta Diones, RN Rehab Admission Coordinator Sign

## 2015-04-19 NOTE — Care Management Note (Signed)
Case Management Note  Patient Details  Name: Steven [name] MRN: [id number] Date of Birth: August 03, 1960  Subjective/Objective:       Patient for dc to inpatient rehab today.             Action/Plan:   Expected Discharge Date:                  Expected Discharge Plan:  IP Rehab Facility  In-House Referral:  Clinical Social Work  Discharge planning Services  CM Consult  Post Acute Care Choice:  IP Rehab Choice offered to:     DME Arranged:    DME Agency:     HH Arranged:    [place] Agency:     Status of Service:  Completed, signed off  Medicare Important Message Given:    Date Medicare IM Given:    Medicare IM give by:    Date Additional Medicare IM Given:    Additional Medicare Important Message give by:     If discussed at [place] of Stay Meetings, dates discussed:    Additional Comments:  [name], RN 04/19/2015, 4:07 PM

## 2015-04-19 NOTE — Progress Notes (Signed)
Family Medicine Teaching Service Daily Progress Note Intern Pager: [phone]  Patient name: Steven [name] Medical record number: [id number] Date of birth: [birth year] Age: 54 y.o. Gender: male  Primary Care Provider: Chrisandra Netters, MD Consultants: Orthopedics  Code Status: Full   Pt Overview and Major Events to Date:   Assessment and Plan: Steven [name] is a 54 y.o. male presenting with right knee stump pain, worsening over 3 weeks with chronic ulceration and eschar, suggestive of dry gangrene. PMH is significant for Afib (on xarelto) DM-2 (poorly controlled, complicated peripheral neuropathy), PVD, HTN, HLD, Tobacco/Cocaine Abuse,   Dry Gangrene, Right stump (s/p R-BKA), in setting of PVD:  Patient continues to have pain this AM. Dry dressing from the outside. Dr. Sharol Given following with patient's status.  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 Evolving  (over 3-4 weeks) Eschar on lower right stump, no signs of local infection, no drainage. Diagnosis of dry Gangrene , s/p AKA on 11/3.  -  Continue oral Percocet,  -  Continue Gabapentin 600 mg TID, for neuropathic pain  -  PT/OT recommends CIR, CIR consulted will follow up after weekend progress.  -  Social work following for possible placement SNF vs CIR   Paroxysmal Atrial fibrillation: CHADs-VASC score 3  - Diltiazem 120mg  daily, avoiding BB due to hx of cocaine use  - Continue Xarelto  T2DM, uncontrolled: Hb A1c 11.2. Home Diabetes regiment includes Lantus 45 units and Novolog 10-15 units TID with meals. Per patient, he does not take his insulin regiment consistently, has not taken Lantus in last week. Current CBGs 200-300 - Increase Lantus to 45 units this AM  - SSI moderate  - CBGs ACQHS   HTN BP 139/61. Not compliant with blood pressure medication - Continue lisinopril, SCr 0.7  Bilateral Leg Amputations Right BKA performed by Dr. 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 Now with a right  AKA after found to have dry gangrene   HLD LDL 114, HDL 38  - Continue Lipitor 40 mg  Cocaine/Tobacco abuse : UDS positive cocaine, states only uses Cocaine intermittently. Smokes about half pack a day  - Nicotine patch   EtOH Abuse Hx: Patient denies using any EtOH recently. CIWA protocol.   FEN/GI: PPI, carb modified diet  Prophylaxis: Xarelto   Disposition: Med-surg, d/c pending CIR evaluation  Subjective:  Patient waiting for placement. He still having significant twinges of pain.   Objective: Temp:  [98.5 F (36.9 C)-99.4 F (37.4 C)] 98.8 F (37.1 C) (11/07 0500) Pulse Rate:  [79-87] 87 (11/07 0500) Resp:  [18-21] 18 (11/07 0500) BP: (123-166)/(77-100) 166/100 mmHg (11/07 0500) SpO2:  [97 %-99 %] 99 % (11/07 0500) Weight:  [199 lb 15.3 oz (90.7 kg)] 199 lb 15.3 oz (90.7 kg) (11/06 1100) Physical Exam: General: Patient sitting up in bed, NAD Cardiovascular: RRR, no murmurs, gallops, rubs  Respiratory: CTAB, no wheezing, rhonchi  Abdomen: BS+, no ttp, no rebound, no guarding  Extremities: Left BKA, Right AKA with dressing in place   Laboratory:  Recent Labs Lab 04/14/15 1040 04/14/15 1046 04/14/15 2026 04/17/15 0511  WBC 7.4  --  8.6 9.6  HGB 15.1 17.0 14.6 12.7*  HCT 43.4 50.0 42.4 38.7*  PLT 297  --  280 291    Recent Labs Lab 04/14/15 1046 04/14/15 2026 04/15/15 0556  NA 134* 135 136  K 3.5 3.6 3.4*  CL 94* 100* 100*  CO2  --  25 28  BUN 7 8 6   CREATININE 0.70 0.84 0.70  CALCIUM  --  9.0 8.9  PROT  --  6.1* 6.0*  BILITOT  --  0.2* 0.2*  ALKPHOS  --  89 80  ALT  --  21 19  AST  --  27 21  GLUCOSE 408* 247* 149*     Imaging/Diagnostic Tests: Dg Tibia/fibula Right  03/30/2015  CLINICAL DATA:  Pain along stump.  Below the knee amputation. EXAM: RIGHT TIBIA AND FIBULA - 2 VIEW COMPARISON:  None. FINDINGS: Below the knee amputation noted. Dystrophic calcifications noted in the vicinity of the amputation site. Soft tissue thickness  overlying the bony margin a minimum of 8 mm. There is some heterotopic ossification extending between the distal tibia and distal fibula margins. I do not see definite gas within the soft tissues. No destructive bony findings characteristic of osteomyelitis. Considerable tricompartmental spurring in the knee. IMPRESSION: 1. No definite gas tracking in the soft tissues along the stump. Dystrophic calcifications are observed and there is a minimum of 8 mm of soft tissue thickness overlying the bony margin. 2. Tricompartmental osteoarthritis. 3. No compelling radiographic findings of osteomyelitis. If pain persists despite conservative therapy, MRI may be warranted for further characterization. Electronically Signed   By: Van Clines M.D.   On: 03/30/2015 13:25   Mr Tibia Fibula Right W Wo Contrast  03/30/2015  CLINICAL DATA:  Below the knee amputations.  Osteomyelitis. EXAM: MRI OF LOWER RIGHT EXTREMITY WITHOUT AND WITH CONTRAST TECHNIQUE: Multiplanar, multisequence MR imaging of the right below-the-knee amputation stump region was performed both before and after administration of intravenous contrast. CONTRAST:  20mL MULTIHANCE GADOBENATE DIMEGLUMINE 529 MG/ML IV SOLN COMPARISON:  03/30/2015 FINDINGS: Low-level edema and very subtle enhancement in the soft tissues just below the distal fibular margin and distal tibial margin on the right, with a slightly asymmetric 2.3 by 0.5 by 1.9 cm collection of fluid tracking around the lateral margin of the distal tibia anteriorly as shown on images 24-28 of series 3. There is a similar tiny fluid collection on the left but smaller. Both of these fluid collections do not I have enhancing margins and are probably not infected. Heterotopic ossification, low in signal intensity, noted spanning between the distal fibula and distal tibial margins. A drainable abscess is not seen. No compelling findings of osteomyelitis. Note is made of spurring in the knee joint. No knee  effusion. Large field of view images include much of the distal thigh and accordingly this is not a highly detailed joint study of the knee. IMPRESSION: 1. Subtle edema and very low grade infiltrative enhancement along the distal margin of the tibia and fibula on the right appears asymmetric and likely represents mild soft tissue inflammation. This could certainly reflect cellulitis. No gas observed in the soft tissues. Tiny amount of fluid tracks around the distal margin of the right tibia anterolaterally, slightly more prominent than on the left, but without enhancing margins to suggest infection/abscess. No osteomyelitis. 2. Osteoarthritis of both knees. Electronically Signed   By: Van Clines M.D.   On: 03/30/2015 19:13     Athanasios Heldman Cletis Media, MD 04/19/2015, 7:34 AM PGY-2, [place] Intern pager: [phone], text pages welcome

## 2015-04-19 NOTE — Clinical Social Work Placement (Signed)
   CLINICAL SOCIAL WORK PLACEMENT  NOTE  Date:  04/19/2015  Patient Details  Name: Steven [name] MRN: [id number] Date of Birth: 1961/01/31  Clinical Social Work is seeking post-discharge placement for this patient at the [place] level of care (*CSW will initial, date and re-position this form in  chart as items are completed):  Yes   Patient/family provided with [facility] Work Department's list of facilities offering this level of care within the geographic area requested by the patient (or if unable, by the patient's family).  Yes   Patient/family informed of their freedom to choose among providers that offer the needed level of care, that participate in Medicare, Medicaid or managed care program needed by the patient, have an available bed and are willing to accept the patient.  Yes   Patient/family informed of [facility]'s ownership interest in [facility] and [facility], as well as of the fact that they are under no obligation to receive care at these facilities.  PASRR submitted to EDS on 04/19/15     PASRR number received on 04/19/15     Existing PASRR number confirmed on       FL2 transmitted to all facilities in geographic area requested by pt/family on 04/19/15     FL2 transmitted to all facilities within larger geographic area on       Patient informed that his/her managed care company has contracts with or will negotiate with certain facilities, including the following:            Patient/family informed of bed offers received.  Patient chooses bed at       Physician recommends and patient chooses bed at      Patient to be transferred to   on  .  Patient to be transferred to facility by       Patient family notified on   of transfer.  Name of family member notified:        PHYSICIAN Please sign FL2, Please sign DNR     Additional Comment:    _______________________________________________ Cranford Mon,  LCSW 04/19/2015, 11:04 AM

## 2015-04-19 NOTE — Progress Notes (Signed)
Patient transferred on CIR - 4W01, per MD order. Patient alert and oriented, received pain medication on the floor before was transported to the new unit. RN called report to the rehab nurse.

## 2015-04-19 NOTE — H&P (Addendum)
Physical Medicine and Rehabilitation Admission H&P    Chief Complaint  Patient presents with  . R-BKA with gangrenous changes requiring revision to R-AKA   HPI: Steven [name] is a 54 y.o. male with history of HTN, PAF- on xarelto, polysubstance abuse--UDS positive for cocaine, DM type 2 with peripheral neuropathy, PAD with B-BKA who was admitted on 04/14/15 with 3 week history of severe pain and coolness of R-BKA site. 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  He had no relief with conservative care.  Dr. Sharol Given recommended revision to BKA.  Patient underwent R-AKA on 11/03 and Gabapentin increased to help manage neuropathic pain. PT/OT evaluations completed and CIR recommended for follow up therapy.   ROS  Constitutional: Negative for fever and chills.  HENT: Negative for hearing loss.  Eyes: Negative for blurred vision and double vision.  Respiratory: Positive for cough. Negative for shortness of breath.  Gastrointestinal: Positive for constipation. Negative for nausea and vomiting.  Genitourinary: Negative for dysuria and hematuria.  Musculoskeletal: Positive for myalgias.  Skin: Negative for rash.  Neurological: Negative for focal weakness and headaches.  All other systems reviewed and are negative.  Past Medical History  Diagnosis Date  . Uncontrolled diabetes mellitus ([place])   . HTN (hypertension)   . History of DVT (deep vein thrombosis)   . Personal history of diabetic foot ulcer   . Peripheral vascular disease ([place])     a. Abnl ABIs 2014.  Marland Kitchen Peripheral neuropathy ([place]) 11/19/2011  . Varicose vein     legs  . Osteomyelitis of foot, right, acute ([place]) 08/30/2012  . Personal history of colonic adenomas 05/29/2013  . History of cocaine use   . PAF (paroxysmal atrial fibrillation) ([place])     a. Dx 11/2013 in setting of sepsis/foot infection.  . Tobacco abuse   . H/O ETOH abuse   . Dysrhythmia     afib  last admission  . Seasonal allergies     Past Surgical History  Procedure Laterality Date  . Amputation  04/21/2011    Procedure: AMPUTATION RAY;  Surgeon: Newt Minion, MD;  Location: [facility];  Service: Orthopedics;  Laterality: Right;  Rt foot 2nd ray ampt  . I&d extremity  05/03/2011    Procedure: IRRIGATION AND DEBRIDEMENT EXTREMITY;  Surgeon: Newt Minion, MD;  Location: [facility];  Service: Orthopedics;  Laterality: Right;  Irrigation and Debridement Right Foot and Place Acell Xenograft  . Toe amputation  04/24/2012    great toe   right foot  . Amputation  04/24/2012    Procedure: AMPUTATION RAY;  Surgeon: Newt Minion, MD;  Location: [place];  Service: Orthopedics;  Laterality: Right;  Right foot 1st ray amputation  . Amputation Left 04/23/2013    Procedure: AMPUTATION RAY;  Surgeon: Newt Minion, MD;  Location: [facility];  Service: Orthopedics;  Laterality: Left;  Left Foot 5th Ray Amputation  . Colonoscopy    . Amputation Left 11/21/2013    Procedure: AMPUTATION RAY;  Surgeon: Newt Minion, MD;  Location: [facility];  Service: Orthopedics;  Laterality: Left;  Left Foot 1st & 2nd Ray Amputation  . Amputation Right 12/04/2013    Procedure: AMPUTATION BELOW KNEE;  Surgeon: Marianna Payment, MD;  Location: [facility];  Service: Orthopedics;  Laterality: Right;  . Amputation Left 12/29/2013    Procedure: LEFT AMPUTATION BELOW KNEE;  Surgeon: Marianna Payment, MD;  Location: [facility];  Service: Orthopedics;  Laterality: Left;  . Amputation Right 04/15/2015    Procedure: AMPUTATION ABOVE KNEE, RIGHT;  Surgeon: Newt Minion, MD;  Location: [facility];  Service: Orthopedics;  Laterality: Right;    Family History  Problem Relation Age of Onset  . Diabetes Mother   . Stroke Brother   . Colon cancer Neg Hx     Social History:  Lives alone. Disabled due to medical issues. Has an aide 3 days/wk.  Independent PTA--used Wheelchair early am and B-prosthesis to ambulate during the day. He reports that he has been  smoking Cigarettes--1/2 PPD.  He started smoking about 31 years ago. He has a 15 pack-year smoking history. He has never used smokeless tobacco. He reports that he drinks about 3.0 - 3.6 oz of alcohol per week. He reports that he does uses cocaine intermittently.     Allergies: No Known Allergies    Medications Prior to Admission  Medication Sig Dispense Refill  . acetaminophen (TYLENOL) 325 MG tablet Take 2 tablets (650 mg total) by mouth every 6 (six) hours as needed. 60 tablet 1  . aspirin 325 MG tablet Take 325 mg by mouth daily.    . cetirizine (ZYRTEC) 10 MG tablet Take 10 mg by mouth daily.  0  . diltiazem (CARDIZEM CD) 120 MG 24 hr capsule Take 120 mg by mouth daily.    . insulin aspart (NOVOLOG) 100 UNIT/ML injection Inject 10-15 Units into the skin daily before supper. 1 vial PRN  . insulin glargine (LANTUS) 100 UNIT/ML injection Inject 0.45 mLs (45 Units total) into the skin daily before breakfast. 10 mL 6  . lisinopril (PRINIVIL,ZESTRIL) 20 MG tablet Take 1 tablet (20 mg total) by mouth daily. For blood pressure 90 tablet 0  . rivaroxaban (XARELTO) 20 MG TABS tablet Take 1 tablet (20 mg total) by mouth daily with supper. For Atrial Fibrillation 90 tablet 1  . atorvastatin (LIPITOR) 40 MG tablet Take 1 tablet (40 mg total) by mouth daily. (Patient not taking: Reported on 04/14/2015) 30 tablet 11  . fluticasone (FLONASE) 50 MCG/ACT nasal spray Place 2 sprays into both nostrils daily. (Patient not taking: Reported on 56/08/8754) 16 g 1  . folic acid (FOLVITE) 1 MG tablet Take 1 tablet (1 mg total) by mouth daily. (Patient not taking: Reported on 04/14/2015) 30 tablet 1  . gabapentin (NEURONTIN) 300 MG capsule Take 300mg  every morning, 300mg  every day at noon, and 600mg  every night at bedtime. (Patient not taking: Reported on 04/14/2015) 120 capsule 5  . nitroGLYCERIN (NITRODUR - DOSED IN MG/24 HR) 0.2 mg/hr patch Place 1 patch (0.2 mg total) onto the skin daily. (Patient not taking: Reported  on 04/14/2015) 30 patch 5  . oxyCODONE-acetaminophen (PERCOCET) 5-325 MG tablet Take 1-2 tablets by mouth every 4 (four) hours as needed. (Patient not taking: Reported on 04/14/2015) 20 tablet 0  . pantoprazole (PROTONIX) 40 MG tablet Take 1 tablet (40 mg total) by mouth at bedtime. (Patient not taking: Reported on 04/14/2015) 90 tablet 1  . [DISCONTINUED] cephALEXin (KEFLEX) 500 MG capsule Take 1 capsule (500 mg total) by mouth 3 (three) times daily. (Patient not taking: Reported on 04/14/2015) 30 capsule 0    Home: Home Living Family/patient expects to be discharged to:: Private residence Living Arrangements: Alone Type of Home: Apartment Home Access: Ramped entrance [place]: One level Bathroom Shower/Tub: Chiropodist:  (same height as his w/c) Bathroom Accessibility: Yes Home Equipment: Walker - 2  wheels, Wheelchair - power, Tub bench, Bedside commode   Functional History: Prior Function Level of Independence: Independent with assistive device(s) Gait / Transfers Assistance Needed: walked with bil BKA prostheses and RW just prior to adm; used w/c first thing in a.m. and before bed (transferring without prostheses either lateral scoot or anterior-posterior) Comments: drove electric w/c to nearby store for groceries; used transportation services for appointments  Functional Status:  Mobility: Bed Mobility Overal bed mobility: Modified Independent General bed mobility comments: with HOB up, pivoted to R side with use of rail, also able to achieve long sitting Transfers Overall transfer level: Needs assistance Equipment used: None Transfers: Government social research officer transfers: Min assist General transfer comment: did not transfer, but able to scoot along EOB with min assist to reposition      ADL: ADL Overall ADL's : Needs assistance/impaired Eating/Feeding: Independent, Sitting Grooming: Wash/dry hands, Wash/dry face, Sitting,  Supervision/safety Upper Body Bathing: Supervision/ safety, Sitting Lower Body Bathing: Moderate assistance, Sitting/lateral leans Upper Body Dressing : Supervision/safety, Sitting Lower Body Dressing: Moderate assistance, Sitting/lateral leans General ADL Comments: Pt declined OOB due to pain.  Cognition: Cognition Overall Cognitive Status: Within Functional Limits for tasks assessed Orientation Level: Oriented to person, Oriented to place, Oriented to situation, Disoriented to time Cognition Arousal/Alertness: Awake/alert Behavior During Therapy: WFL for tasks assessed/performed Overall Cognitive Status: Within Functional Limits for tasks assessed   Blood pressure 140/73, pulse 71, temperature 97.9 F (36.6 C), temperature source Oral, resp. rate 18, height 6\' 4"  (1.93 m), weight 90.7 kg (199 lb 15.3 oz), SpO2 100 %. Physical Exam  Nursing note and vitals reviewed. Musculoskeletal:  Strength b/l UE: 5/5 throughout Right hip flexion 5/5 Left hip flexion, knee extension 5/5 Left BKA Right AKA   Constitutional: He is oriented to person, place, and time. He appears well-developed and well-nourished.  HENT:  Head: Normocephalic and atraumatic.  Eyes: Conjunctivae and EOM are normal.  Neck: Normal range of motion. Neck supple. No thyromegaly present.  Cardiovascular:  RRR.  No murmurs  Respiratory: Effort normal and breath sounds normal. No respiratory distress.  GI: Soft. Bowel sounds are normal. He exhibits no distension.  Musculoskeletal: He exhibits edema and tenderness.  Neurological: He is alert and oriented to person, place, and time. He exhibits normal muscle tone.  Skin: Skin is warm and dry.  Post op dressing to right AKA. Left BKA is well healed  Psychiatric: He has a normal mood and affect. His behavior is normal  Results for orders placed or performed during the hospital encounter of 04/14/15 (from the past 48 hour(s))  Glucose, capillary     Status: Abnormal    Collection Time: 04/17/15  4:49 PM  Result Value Ref Range   Glucose-Capillary 310 (H) 65 - 99 mg/dL  Glucose, capillary     Status: Abnormal   Collection Time: 04/17/15  9:40 PM  Result Value Ref Range   Glucose-Capillary 282 (H) 65 - 99 mg/dL  Glucose, capillary     Status: Abnormal   Collection Time: 04/18/15  8:06 AM  Result Value Ref Range   Glucose-Capillary 225 (H) 65 - 99 mg/dL  Glucose, capillary     Status: Abnormal   Collection Time: 04/18/15 12:05 PM  Result Value Ref Range   Glucose-Capillary 442 (H) 65 - 99 mg/dL  Glucose, capillary     Status: Abnormal   Collection Time: 04/18/15  5:10 PM  Result Value Ref Range   Glucose-Capillary 151 (H) 65 - 99 mg/dL  Glucose,  capillary     Status: Abnormal   Collection Time: 04/18/15  9:35 PM  Result Value Ref Range   Glucose-Capillary 315 (H) 65 - 99 mg/dL  Glucose, capillary     Status: Abnormal   Collection Time: 04/19/15  8:15 AM  Result Value Ref Range   Glucose-Capillary 184 (H) 65 - 99 mg/dL  Glucose, capillary     Status: Abnormal   Collection Time: 04/19/15 12:23 PM  Result Value Ref Range   Glucose-Capillary 209 (H) 65 - 99 mg/dL   No results found.  Medical Problem List and Plan: 1. 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  DVT Prophylaxis/Anticoagulation: Pharmaceutical: Xarelto 3. Pain Management:  Continue Oxycodone prn.  4. Mood: LCSW to follow for evaluation and support.  5. Neuropsych: This patient is capable of making decisions on his own behalf. 6. Skin/Wound Care: routine pressure relief measures. Maintain adequate nutrition and hydration status.  7. Fluids/Electrolytes/Nutrition: Monitor I/O. Check lytes in am.  8. DM type 2: Poorly controlled due to non-compliance with lantus.  Will monitor BS ac/hs. Continue Lantus daily with meal coverage.  9. HTN: Monitor BP bid. Continue Cardizem and lisinopril  10. PAF: Monitor HR bid and for any symptoms with activity. On cardizem CD  dailyNo BB due to cocaine use. 11. ABLA: Add iron supplement. Recheck in am.  12. Constipation: Increase miralax to bid.    Post Admission Physician Evaluation: Functional deficits secondary  to Revision of R-BKA to AKA. 1. Patient is admitted to receive collaborative, interdisciplinary care between the physiatrist, rehab nursing staff, and therapy team. 2. Patient's level of medical complexity and substantial therapy needs in context of that medical necessity cannot be provided at a lesser intensity of care such as a SNF. 3. Patient has experienced substantial functional loss from his/her baseline which was documented above under the "Functional History" and "Functional Status" headings.  Judging by the patient's diagnosis, physical exam, and functional history, the patient has potential for functional progress which will result in measurable gains while on inpatient rehab.  These gains will be of substantial and practical use upon discharge  in facilitating mobility and self-care at the household level. 4. Physiatrist will provide 24 hour management of medical needs as well as oversight of the therapy plan/treatment and provide guidance as appropriate regarding the interaction of the two. 5. 24 hour rehab nursing will assist with safety, skin/wound care, disease management, medication administration, pain management and patient education and help integrate therapy concepts, techniques,education, etc. 6. PT will assess and treat for/with: Lower extremity strength, range of motion, stamina, balance, functional mobility, safety, adaptive techniques and equipment, woundcare, coping skills, pain control, AKA pre-prosthetic training, education.   Goals are: Mod I. 7. OT will assess and treat for/with: ADL's, functional mobility, safety, upper extremity strength, adaptive techniques and equipment, wound mgt, ego support, and community reintegration.   Goals are: Ind/Mod I. Therapy may not proceed with  showering this patient. 8. Case Management and Social Worker will assess and treat for psychological issues and discharge planning. 9. Team conference will be held weekly to assess progress toward goals and to determine barriers to discharge. 10. Patient will receive at least 3 hours of therapy per day at least 5 days per week. 11. ELOS: 14-17 days.       12. Prognosis:  good  Delice Lesch, MD  04/19/2015

## 2015-04-19 NOTE — Interval H&P Note (Signed)
Steven [name] was admitted today to Inpatient Rehabilitation with the diagnosis of Revision of L-BKA to AKA.  The patient's history has been reviewed, patient examined, and there is no change in status.  Patient continues to be appropriate for intensive inpatient rehabilitation.  I have reviewed the patient's chart and labs.  Questions were answered to the patient's satisfaction. The PAPE has been reviewed and assessment remains appropriate.  Collette Pescador Lorie Phenix 04/19/2015, 7:40 PM

## 2015-04-19 NOTE — Progress Notes (Signed)
Exterior dressing on right stump was changed with ABD pad.

## 2015-04-19 NOTE — Consult Note (Signed)
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 right handed male with history of hypertension, PAF maintained on xarelto, tobacco, alcohol abuse, polysubstance abuse, diabetes mellitus , peripheral neuropathy and unilateral BKA. Patient with recent right BKA 12/04/2013 due to gangrenous changes and received inpatient rehabilitation services 12/09/2013 until 12/18/2013. He was discharged to home with home health therapies. Lives alone uses bilateral BKA prosthesis provided by a biotech and rolling walker prior to admission in a one level apartment with a ramped entrance. He would use an electric wheelchair when out and about in the community. Presented 04/14/2015 with ischemic gangrenous changes of right below-knee amputation site. No relief with conservative care. A urine drug screen was positive for cocaine. 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 Sharol Given. Hospital course complicated by pain management. Xarelto ongoing as prior to admission. Physical therapy evaluation completed with recommendations of physical medicine rehabilitation consult.   Review of Systems  Constitutional: Negative for fever and chills.  HENT: Negative for hearing loss.   Eyes: Negative for blurred vision and double vision.  Respiratory: Positive for cough. Negative for shortness of breath.   Gastrointestinal: Positive for constipation. Negative for nausea and vomiting.  Genitourinary: Negative for dysuria and hematuria.  Musculoskeletal: Positive for myalgias.  Skin: Negative for rash.  Neurological: Negative for focal weakness and headaches.  All other systems reviewed and are negative.  Past Medical History  Diagnosis Date  . Uncontrolled diabetes mellitus ([place])   . HTN (hypertension)   . History of DVT (deep vein thrombosis)   . Personal history of diabetic foot ulcer   . Peripheral  vascular disease ([place])     a. Abnl ABIs 2014.  Marland Kitchen Peripheral neuropathy ([place]) 11/19/2011  . Varicose vein     legs  . Osteomyelitis of foot, right, acute ([place]) 08/30/2012  . Personal history of colonic adenomas 05/29/2013  . History of cocaine use   . PAF (paroxysmal atrial fibrillation) ([place])     a. Dx 11/2013 in setting of sepsis/foot infection.  . Tobacco abuse   . H/O ETOH abuse   . Dysrhythmia     afib last admission  . Seasonal allergies    Past Surgical History  Procedure Laterality Date  . Amputation  04/21/2011    Procedure: AMPUTATION RAY;  Surgeon: Newt Minion, MD;  Location: [facility];  Service: Orthopedics;  Laterality: Right;  Rt foot 2nd ray ampt  . I&d extremity  05/03/2011    Procedure: IRRIGATION AND DEBRIDEMENT EXTREMITY;  Surgeon: Newt Minion, MD;  Location: [facility];  Service: Orthopedics;  Laterality: Right;  Irrigation and Debridement Right Foot and Place Acell Xenograft  . Toe amputation  04/24/2012    great toe   right foot  . Amputation  04/24/2012    Procedure: AMPUTATION RAY;  Surgeon: Newt Minion, MD;  Location: [facility];  Service: Orthopedics;  Laterality: Right;  Right foot 1st ray amputation  . Amputation Left 04/23/2013    Procedure: AMPUTATION RAY;  Surgeon: Newt Minion, MD;  Location: [facility];  Service: Orthopedics;  Laterality: Left;  Left Foot 5th Ray Amputation  . Colonoscopy    . Amputation Left 11/21/2013    Procedure: AMPUTATION RAY;  Surgeon: Newt Minion, MD;  Location: [place];  Service: Orthopedics;  Laterality: Left;  Left Foot 1st & 2nd Ray Amputation  . Amputation Right  12/04/2013    Procedure: AMPUTATION BELOW KNEE;  Surgeon: Marianna Payment, MD;  Location: [place];  Service: Orthopedics;  Laterality: Right;  . Amputation Left 12/29/2013    Procedure: LEFT AMPUTATION BELOW KNEE;  Surgeon: Marianna Payment, MD;  Location: [place];  Service: Orthopedics;  Laterality: Left;  . Amputation Right 04/15/2015    Procedure: AMPUTATION ABOVE KNEE, RIGHT;   Surgeon: Newt Minion, MD;  Location: [facility];  Service: Orthopedics;  Laterality: Right;   Family History  Problem Relation Age of Onset  . Diabetes Mother   . Stroke Brother   . Colon cancer Neg Hx    Social History:  reports that he has been smoking Cigarettes.  He started smoking about 31 years ago. He has a 15 pack-year smoking history. He has never used smokeless tobacco. He reports that he drinks about 3.0 - 3.6 oz of alcohol per week. He reports that he does not use illicit drugs. Allergies: No Known Allergies Medications Prior to Admission  Medication Sig Dispense Refill  . acetaminophen (TYLENOL) 325 MG tablet Take 2 tablets (650 mg total) by mouth every 6 (six) hours as needed. 60 tablet 1  . aspirin 325 MG tablet Take 325 mg by mouth daily.    . cetirizine (ZYRTEC) 10 MG tablet Take 10 mg by mouth daily.  0  . diltiazem (CARDIZEM CD) 120 MG 24 hr capsule Take 120 mg by mouth daily.    . insulin aspart (NOVOLOG) 100 UNIT/ML injection Inject 10-15 Units into the skin daily before supper. 1 vial PRN  . insulin glargine (LANTUS) 100 UNIT/ML injection Inject 0.45 mLs (45 Units total) into the skin daily before breakfast. 10 mL 6  . lisinopril (PRINIVIL,ZESTRIL) 20 MG tablet Take 1 tablet (20 mg total) by mouth daily. For blood pressure 90 tablet 0  . rivaroxaban (XARELTO) 20 MG TABS tablet Take 1 tablet (20 mg total) by mouth daily with supper. For Atrial Fibrillation 90 tablet 1  . atorvastatin (LIPITOR) 40 MG tablet Take 1 tablet (40 mg total) by mouth daily. (Patient not taking: Reported on 04/14/2015) 30 tablet 11  . fluticasone (FLONASE) 50 MCG/ACT nasal spray Place 2 sprays into both nostrils daily. (Patient not taking: Reported on 22/0/2542) 16 g 1  . folic acid (FOLVITE) 1 MG tablet Take 1 tablet (1 mg total) by mouth daily. (Patient not taking: Reported on 04/14/2015) 30 tablet 1  . gabapentin (NEURONTIN) 300 MG capsule Take 300mg  every morning, 300mg  every day at noon, and 600mg   every night at bedtime. (Patient not taking: Reported on 04/14/2015) 120 capsule 5  . nitroGLYCERIN (NITRODUR - DOSED IN MG/24 HR) 0.2 mg/hr patch Place 1 patch (0.2 mg total) onto the skin daily. (Patient not taking: Reported on 04/14/2015) 30 patch 5  . oxyCODONE-acetaminophen (PERCOCET) 5-325 MG tablet Take 1-2 tablets by mouth every 4 (four) hours as needed. (Patient not taking: Reported on 04/14/2015) 20 tablet 0  . pantoprazole (PROTONIX) 40 MG tablet Take 1 tablet (40 mg total) by mouth at bedtime. (Patient not taking: Reported on 04/14/2015) 90 tablet 1  . [DISCONTINUED] cephALEXin (KEFLEX) 500 MG capsule Take 1 capsule (500 mg total) by mouth 3 (three) times daily. (Patient not taking: Reported on 04/14/2015) 30 capsule 0    Home: Home Living Family/patient expects to be discharged to:: Private residence Living Arrangements: Alone Type of Home: Apartment Home Access: Ramped entrance Smock: One level Bathroom Shower/Tub: Chiropodist:  (same height as his  w/c) Bathroom Accessibility: Yes Home Equipment: Walker - 2 wheels, Wheelchair - power, Tub bench, Bedside commode  Functional History: Prior Function Level of Independence: Independent with assistive device(s) Gait / Transfers Assistance Needed: walked with bil BKA prostheses and RW just prior to adm; used w/c first thing in a.m. and before bed (transferring without prostheses either lateral scoot or anterior-posterior) Comments: drove electric w/c to nearby store for groceries; used transportation services for appointments Functional Status:  Mobility: Bed Mobility Overal bed mobility: Modified Independent General bed mobility comments: with HOB up, pivoted to R side with use of rail, also able to achieve long sitting Transfers Overall transfer level: Needs assistance Equipment used: None Transfers: Government social research officer transfers: Min assist General transfer comment: did not  transfer, but able to scoot along EOB with min assist to reposition      ADL: ADL Overall ADL's : Needs assistance/impaired Eating/Feeding: Independent, Sitting Grooming: Wash/dry hands, Wash/dry face, Sitting, Supervision/safety Upper Body Bathing: Supervision/ safety, Sitting Lower Body Bathing: Moderate assistance, Sitting/lateral leans Upper Body Dressing : Supervision/safety, Sitting Lower Body Dressing: Moderate assistance, Sitting/lateral leans General ADL Comments: Pt declined OOB due to pain.  Cognition: Cognition Overall Cognitive Status: Within Functional Limits for tasks assessed Orientation Level: Oriented to person, Oriented to place, Oriented to situation, Disoriented to time Cognition Arousal/Alertness: Awake/alert Behavior During Therapy: WFL for tasks assessed/performed Overall Cognitive Status: Within Functional Limits for tasks assessed  Blood pressure 166/100, pulse 87, temperature 98.8 F (37.1 C), temperature source Oral, resp. rate 18, height 6\' 4"  (1.93 m), weight 90.7 kg (199 lb 15.3 oz), SpO2 99 %. Physical Exam  Vitals reviewed. Constitutional: He is oriented to person, place, and time. He appears well-developed and well-nourished.  HENT:  Head: Normocephalic and atraumatic.  Eyes: Conjunctivae and EOM are normal.  Neck: Normal range of motion. Neck supple. No thyromegaly present.  Cardiovascular:  Cardiac rate controlled  Respiratory: Effort normal and breath sounds normal. No respiratory distress.  GI: Soft. Bowel sounds are normal. He exhibits no distension.  Musculoskeletal: He exhibits edema and tenderness.  Strength b/l UE: 5/5 throughout Right hip flexion 5/5 Left hip flexion, knee extension 5/5 Left BKA Right AKA  Neurological: He is alert and oriented to person, place, and time. He exhibits normal muscle tone.  Skin: Skin is warm and dry.  Post op dressing to right AKA. Left BKA is well healed  Psychiatric: He has a normal mood and  affect. His behavior is normal.    Results for orders placed or performed during the hospital encounter of 04/14/15 (from the past 24 hour(s))  Glucose, capillary     Status: Abnormal   Collection Time: 04/18/15  5:10 PM  Result Value Ref Range   Glucose-Capillary 151 (H) 65 - 99 mg/dL  Glucose, capillary     Status: Abnormal   Collection Time: 04/18/15  9:35 PM  Result Value Ref Range   Glucose-Capillary 315 (H) 65 - 99 mg/dL  Glucose, capillary     Status: Abnormal   Collection Time: 04/19/15  8:15 AM  Result Value Ref Range   Glucose-Capillary 184 (H) 65 - 99 mg/dL  Glucose, capillary     Status: Abnormal   Collection Time: 04/19/15 12:23 PM  Result Value Ref Range   Glucose-Capillary 209 (H) 65 - 99 mg/dL   No results found.  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  Records reviewed and summated above. PT/OT for mobility, ADL's, strengthening,and wheelchair training  Clean amputation daily with soap and water Monitor incision site for signs of infection or impending skin breakdown. Staples to remain in place for 3-4 weeks Stump shrinker, for edema control  Scar mobilization massaging to prevent soft tissue adherence Stump protector during therapies Prevent flexion contractures by implementing the following:   Encourage prone lying for 20-30 mins per day BID to avoid hip flexion contractures if medically appropriate;  Avoid pillow under knees when patient is lying in bed in order to prevent both knee and hip flexion contractures;  Avoid prolonged sitting Post surgical pain control with oral medication Phantom limb pain control with physical modalities including desensitization techniques (gentle self massage to the residual stump,hot packs if sensation iintact, [place]) and mirror therapy, TENS. If ineffective, consider pharmacological treatment for neuropathic pain (e.g gabapentin, pregabalin, amytriptalyine,  duloxetine).  When using wheelchair, patient should have knee on amputated side fully extended with board under the seat cushion. Avoid injury to contralateral side  1. Does the need for close, 24 hr/day medical supervision in concert with the patient's rehab needs make it unreasonable for this patient to be served in a less intensive setting? Yes  2. Co-Morbidities requiring supervision/potential complications: HTN (monitor and provide prns in accordance with increased physical exertion and pain), PAF (Cont meds), tobacco (cont counseling to promote wound healing), alcohol abuse, polysubstance abuse (Cont counseling), DM (Monitor in accordance with exercise and adjust meds as necessary), peripheral neuropathy, unilateral BKA 3. Due to safety, skin/wound care, disease management, medication administration, pain management and patient education, does the patient require 24 hr/day rehab nursing? Yes 4. Does the patient require coordinated care of a physician, rehab nurse, PT (1.5-2 hrs/day, 5 days/week) and OT (1.5-2 hrs/day, 5 days/week) to address physical and functional deficits in the context of the above medical diagnosis(es)? Yes Addressing deficits in the following areas: balance, endurance, locomotion, strength, transferring, bathing, dressing, toileting and psychosocial support 5. Can the patient actively participate in an intensive therapy program of at least 3 hrs of therapy per day at least 5 days per week? Yes 6. The potential for patient to make measurable gains while on inpatient rehab is good 7. Anticipated functional outcomes upon discharge from inpatient rehab are modified independent  with PT, independent and modified independent with OT, n/a with SLP. 8. Estimated rehab length of stay to reach the above functional goals is: 14-17 days. 9. Does the patient have adequate social supports and living environment to accommodate these discharge functional goals? Potentially 10. Anticipated  D/C setting: Home 11. Anticipated post D/C treatments: HH therapy and Home excercise program 12. Overall Rehab/Functional Prognosis: good  RECOMMENDATIONS: This patient's condition is appropriate for continued rehabilitative care in the following setting: CIR Patient has agreed to participate in recommended program. Yes Note that insurance prior authorization may be required for reimbursement for recommended care.  Comment: Rehab Admissions Coordinator to follow up.   Delice Lesch, MD 04/19/2015

## 2015-04-20 ENCOUNTER — Inpatient Hospital Stay (HOSPITAL_COMMUNITY): Payer: Medicaid Other

## 2015-04-20 ENCOUNTER — Inpatient Hospital Stay (HOSPITAL_COMMUNITY): Payer: Medicaid Other | Admitting: Occupational Therapy

## 2015-04-20 ENCOUNTER — Inpatient Hospital Stay (HOSPITAL_COMMUNITY): Payer: Medicaid Other | Admitting: Physical Therapy

## 2015-04-20 LAB — CBC WITH DIFFERENTIAL/PLATELET
BASOS ABS: 0 10*3/uL (ref 0.0–0.1)
Basophils Relative: 0 %
EOS ABS: 0.2 10*3/uL (ref 0.0–0.7)
EOS PCT: 2 %
HCT: 37.9 % — ABNORMAL LOW (ref 39.0–52.0)
Hemoglobin: 12.4 g/dL — ABNORMAL LOW (ref 13.0–17.0)
LYMPHS PCT: 28 %
Lymphs Abs: 2.3 10*3/uL (ref 0.7–4.0)
MCH: 28.6 pg (ref 26.0–34.0)
MCHC: 32.7 g/dL (ref 30.0–36.0)
MCV: 87.3 fL (ref 78.0–100.0)
MONO ABS: 0.6 10*3/uL (ref 0.1–1.0)
Monocytes Relative: 7 %
Neutro Abs: 5 10*3/uL (ref 1.7–7.7)
Neutrophils Relative %: 63 %
PLATELETS: 357 10*3/uL (ref 150–400)
RBC: 4.34 MIL/uL (ref 4.22–5.81)
RDW: 14.9 % (ref 11.5–15.5)
WBC: 8 10*3/uL (ref 4.0–10.5)

## 2015-04-20 LAB — COMPREHENSIVE METABOLIC PANEL
ALT: 29 U/L (ref 17–63)
AST: 31 U/L (ref 15–41)
Albumin: 2 g/dL — ABNORMAL LOW (ref 3.5–5.0)
Alkaline Phosphatase: 70 U/L (ref 38–126)
Anion gap: 8 (ref 5–15)
BUN: 12 mg/dL (ref 6–20)
CHLORIDE: 99 mmol/L — AB (ref 101–111)
CO2: 30 mmol/L (ref 22–32)
Calcium: 9.2 mg/dL (ref 8.9–10.3)
Creatinine, Ser: 0.87 mg/dL (ref 0.61–1.24)
Glucose, Bld: 125 mg/dL — ABNORMAL HIGH (ref 65–99)
POTASSIUM: 4 mmol/L (ref 3.5–5.1)
SODIUM: 137 mmol/L (ref 135–145)
Total Bilirubin: 0.4 mg/dL (ref 0.3–1.2)
Total Protein: 6.5 g/dL (ref 6.5–8.1)

## 2015-04-20 LAB — GLUCOSE, CAPILLARY
GLUCOSE-CAPILLARY: 280 mg/dL — AB (ref 65–99)
GLUCOSE-CAPILLARY: 155 mg/dL — AB (ref 65–99)
Glucose-Capillary: 104 mg/dL — ABNORMAL HIGH (ref 65–99)
Glucose-Capillary: 180 mg/dL — ABNORMAL HIGH (ref 65–99)

## 2015-04-20 LAB — URINALYSIS, ROUTINE W REFLEX MICROSCOPIC
Bilirubin Urine: NEGATIVE
Glucose, UA: 250 mg/dL — AB
Ketones, ur: NEGATIVE mg/dL
Leukocytes, UA: NEGATIVE
NITRITE: NEGATIVE
PROTEIN: 100 mg/dL — AB
Specific Gravity, Urine: 1.016 (ref 1.005–1.030)
UROBILINOGEN UA: 1 mg/dL (ref 0.0–1.0)
pH: 7 (ref 5.0–8.0)

## 2015-04-20 LAB — URINE MICROSCOPIC-ADD ON

## 2015-04-20 MED ORDER — TRAMADOL HCL 50 MG PO TABS
50.0000 mg | ORAL_TABLET | Freq: Three times a day (TID) | ORAL | Status: DC
  Administered 2015-04-20 – 2015-04-27 (×28): 50 mg via ORAL
  Filled 2015-04-20 (×28): qty 1

## 2015-04-20 NOTE — Progress Notes (Signed)
Patient information reviewed and entered into eRehab system by Glorian Mcdonell, RN, CRRN, PPS Coordinator.  Information including medical coding and functional independence measure will be reviewed and updated through discharge.     Per nursing patient was given "Data Collection Information Summary for Patients in Inpatient Rehabilitation Facilities with attached "Privacy Act Statement-Health Care Records" upon admission.  

## 2015-04-20 NOTE — Evaluation (Signed)
Occupational Therapy Assessment and Plan  Patient Details  Name: Steven [name] MRN: [id number] Date of Birth: 1961-02-22  OT Diagnosis: acute pain and muscle weakness (generalized) Rehab Potential: Rehab Potential (ACUTE ONLY): Good ELOS: 5-7 days   Today's Date: 04/20/2015 OT Individual Time: 0800-0900 OT Individual Time Calculation (min): 60 min     Problem List:  Patient Active Problem List   Diagnosis Date Noted  . Unilateral complete AKA ([place]) 04/19/2015  . Dry gangrene ([place])   . Right leg pain 04/14/2015  . Cough 08/12/2014  . Bilateral hand pain 05/29/2014  . Acne 03/30/2014  . S/P bilateral BKA (below knee amputation) ([place]) 01/22/2014  . Cocaine abuse 12/05/2013  . Tobacco abuse 12/04/2013  . Peripheral vascular disease ([place])   . Atrial fibrillation ([place]) 12/03/2013  . 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 History of colonic polyps 05/29/2013  . Diabetic retinopathy ([place]) 12/25/2012  . HLD (hyperlipidemia) 12/08/2012  . Peripheral neuropathy ([place]) 11/19/2011  . ETOH abuse 03/29/2011  . HTN (hypertension) 12/22/2010  . Diabetes mellitus type II, uncontrolled ([place]) 07/18/2006  . VENOUS INSUFFICIENCY 07/18/2006    Past Medical History:  Past Medical History  Diagnosis Date  . Uncontrolled diabetes mellitus ([place])   . HTN (hypertension)   . History of DVT (deep vein thrombosis)   . Personal history of diabetic foot ulcer   . Peripheral vascular disease ([place])     a. Abnl ABIs 2014.  Marland Kitchen Peripheral neuropathy ([place]) 11/19/2011  . Varicose vein     legs  . Osteomyelitis of foot, right, acute ([place]) 08/30/2012  . Personal history of colonic adenomas 05/29/2013  . History of cocaine use   . PAF (paroxysmal atrial fibrillation) ([place])     a. Dx 11/2013 in setting of sepsis/foot infection.  . Tobacco abuse   . H/O ETOH abuse   . Dysrhythmia     afib last admission  . Seasonal allergies    Past Surgical History:  Past Surgical History  Procedure Laterality Date  .  Amputation  04/21/2011    Procedure: AMPUTATION RAY;  Surgeon: Newt Minion, MD;  Location: [facility] [facility];  Service: Orthopedics;  Laterality: Right;  Rt foot 2nd ray ampt  . I&d extremity  05/03/2011    Procedure: IRRIGATION AND DEBRIDEMENT EXTREMITY;  Surgeon: Newt Minion, MD;  Location: [facility];  Service: Orthopedics;  Laterality: Right;  Irrigation and Debridement Right Foot and Place Acell Xenograft  . Toe amputation  04/24/2012    great toe   right foot  . Amputation  04/24/2012    Procedure: AMPUTATION RAY;  Surgeon: Newt Minion, MD;  Location: [facility];  Service: Orthopedics;  Laterality: Right;  Right foot 1st ray amputation  . Amputation Left 04/23/2013    Procedure: AMPUTATION RAY;  Surgeon: Newt Minion, MD;  Location: [facility];  Service: Orthopedics;  Laterality: Left;  Left Foot 5th Ray Amputation  . Colonoscopy    . Amputation Left 11/21/2013    Procedure: AMPUTATION RAY;  Surgeon: Newt Minion, MD;  Location: [facility];  Service: Orthopedics;  Laterality: Left;  Left Foot 1st & 2nd Ray Amputation  . Amputation Right 12/04/2013    Procedure: AMPUTATION BELOW KNEE;  Surgeon: Marianna Payment, MD;  Location: [facility];  Service: Orthopedics;  Laterality: Right;  . Amputation Left 12/29/2013    Procedure: LEFT AMPUTATION BELOW KNEE;  Surgeon: Marianna Payment, MD;  Location: [facility];  Service: Orthopedics;  Laterality: Left;  . Amputation Right 04/15/2015  Procedure: AMPUTATION ABOVE KNEE, RIGHT;  Surgeon: Newt Minion, MD;  Location: [facility];  Service: Orthopedics;  Laterality: Right;    Assessment & Plan Clinical Impression: Patient is a 54 y.o. year old male with history of HTN, PAF- on xarelto, polysubstance abuse--UDS positive for cocaine, DM type 2 with peripheral neuropathy, PAD with B-BKA who was admitted on 04/14/15 with 3 week history of severe pain and coolness of R-BKA site. 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 He  had no relief with conservative care. Dr. Sharol Given recommended revision to BKA. Patient underwent R-AKA on 11/03 and Gabapentin increased to help manage neuropathic pain.  Patient transferred to CIR on 04/19/2015 .    Patient currently requires min with basic self-care skills secondary to muscle weakness and acute pain, decreased cardiorespiratoy endurance and decreased sitting balance.  Prior to hospitalization, patient could complete ADL with modified independent  with bilateral prosthesis.   Patient will benefit from skilled intervention to decrease level of assist with basic self-care skills and increase independence with basic self-care skills prior to discharge home independently.  Anticipate patient will require intermittent supervision and follow up home health.  OT - End of Session Activity Tolerance: Improving OT Assessment Rehab Potential (ACUTE ONLY): Good OT Patient demonstrates impairments in the following area(s): Balance;Edema;Endurance;Motor;Pain;Safety;Skin Integrity OT Basic ADL's Functional Problem(s): Grooming;Bathing;Dressing;Toileting OT Transfers Functional Problem(s): Toilet;Tub/Shower OT Additional Impairment(s): Fuctional Use of Upper Extremity OT Plan OT Intensity: Minimum of 1-2 x/day, 45 to 90 minutes OT Frequency: 5 out of 7 days OT Duration/Estimated Length of Stay: 5-7 days OT Treatment/Interventions: Balance/vestibular training;Cognitive remediation/compensation;Community reintegration;Discharge planning;Pain management;Skin care/wound managment;Functional mobility training;Self Care/advanced ADL retraining;UE/LE Strength taining/ROM;Therapeutic Exercise;Psychosocial support;Patient/family education;Wheelchair propulsion/positioning;Therapeutic Activities;DME/adaptive equipment instruction;UE/LE Coordination activities OT Self Feeding Anticipated Outcome(s): n/a OT Basic Self-Care Anticipated Outcome(s): mod I  OT Toileting Anticipated Outcome(s): mod I  OT  Bathroom Transfers Anticipated Outcome(s): mod I  OT Recommendation Patient destination: Home Follow Up Recommendations: Home health OT Equipment Recommended: To be determined   Skilled Therapeutic Intervention OT: initiated with Ot goals, purpose and role discussed with pt. Focus on bed mobility with flat bed for supine<>sit and rolling for LB clothing management and sitting balance during dressing tasks. Pt with 8/10 ongoing pain in right residual limb require more than reasonable amt of time to perform ADL tasks. Posterior transfer bed into w/c with steadying A and A for setup.  Pt attempted toilet transfer in bathroom however pt limiting ability to transfer. Pt perform all grooming tasks at sink side.   OT Evaluation Precautions/Restrictions  Precautions Precautions: Fall Precaution Comments: prostheses are at the hospital, but not the inserts Restrictions Weight Bearing Restrictions: Yes RLE Weight Bearing: Non weight bearing General Chart Reviewed: Yes Family/Caregiver Present: No Vital Signs Therapy Vitals Pulse Rate: 94 Resp: 20 BP: (!) 158/84 mmHg Patient Position (if appropriate): Lying Oxygen Therapy SpO2: 99 % O2 Device: Not Delivered Pain Pain Assessment Pain Assessment: 0-10 Pain Score: 8  Pain Type: Surgical pain;Acute pain Pain Location: Leg Pain Orientation: Right Pain Descriptors / Indicators: Aching Pain Onset: On-going Pain Intervention(s): RN made aware Home Living/Prior Functioning Home Living Available Help at Discharge: Available PRN/intermittently Type of Home: Apartment Home Access: Ramped entrance Home Layout: One level Bathroom Shower/Tub: Chiropodist: Standard Bathroom Accessibility: Yes  Lives With: Other (Comment) (with girlfriend) Prior Function Level of Independence: Independent with transfers, Requires assistive device for independence, Independent with gait Driving:  Yes Comments: drove electric w/c to nearby  store for groceries; used transportation services for appointments ADL ADL ADL Comments: see function navigator Vision/Perception  Vision- History Baseline Vision/History: Wears glasses Wears Glasses: At all times Patient Visual Report: No change from baseline Perception Comments: WFL  Cognition Overall Cognitive Status: Within Functional Limits for tasks assessed Arousal/Alertness: Awake/alert Orientation Level: Person;Place;Situation Person: Oriented Place: Oriented Situation: Oriented Year: 2016 Month: November Day of Week: Correct Immediate Memory Recall: Sock;Blue;Bed Memory Recall: Blue;Sock;Bed Memory Recall Sock: Without Cue Memory Recall Blue: Without Cue Memory Recall Bed: With Cue Attention: Selective Selective Attention: Appears intact Awareness: Appears intact Safety/Judgment: Appears intact Sensation Sensation Light Touch: Impaired Detail Light Touch Impaired Details: Impaired RLE Stereognosis: Appears Intact Hot/Cold: Appears Intact Coordination Gross Motor Movements are Fluid and Coordinated: Yes Fine Motor Movements are Fluid and Coordinated: Yes Motor  Motor Motor - Skilled Clinical Observations: generalized weakness Mobility  Bed Mobility Bed Mobility: Sit to Supine;Supine to Sit Supine to Sit: 6: Modified independent (Device/Increase time) Sit to Supine: 6: Modified independent (Device/Increase time) Transfers Transfers:  (AP transfer with min )  Trunk/Postural Assessment  Cervical Assessment Cervical Assessment: Within Functional Limits Thoracic Assessment Thoracic Assessment: Within Functional Limits Lumbar Assessment Lumbar Assessment: Within Functional Limits Postural Control Postural Control: Within Functional Limits  Balance Dynamic Sitting Balance Sitting balance - Comments: able to sit unsupported with UEs free for ADL Extremity/Trunk Assessment RUE Assessment RUE Assessment: Within Functional Limits LUE Assessment LUE  Assessment: Within Functional Limits   See Function Navigator for Current Functional Status.   Refer to Care Plan for Long Term Goals  Recommendations for other services: None  Discharge Criteria: Patient will be discharged from OT if patient refuses treatment 3 consecutive times without medical reason, if treatment goals not met, if there is a change in medical status, if patient makes no progress towards goals or if patient is discharged from hospital.  The above assessment, treatment plan, treatment alternatives and goals were discussed and mutually agreed upon: by patient  Nicoletta Ba 04/20/2015, 10:15 AM

## 2015-04-20 NOTE — Progress Notes (Addendum)
Patient A/O, no noted distress. C/O pain, administered pain regimen (see EMar). Patient notes pain regimen not effective. Notified PA of patient c/o. Educated patient phantom pain and medication. Patient continues to participate in therapy. Tolerated meds well. Patient refused bowel regimen, educated the patient on regimen. LBM 04/19/15. Consume meals, no n/v. Staff will continue to monitor and meet needs.

## 2015-04-20 NOTE — Evaluation (Signed)
Physical Therapy Assessment and Plan  Patient Details  Name: Steven [name] MRN: [id number] Date of Birth: 1960/08/07  PT Diagnosis: Abnormal posture, Impaired sensation, Muscle weakness and Pain in R residual limb Rehab Potential: Good ELOS: 5-7 days   Today's Date: 04/20/2015 PT Individual Time: [phone] (60 minutes)     Problem List:  Patient Active Problem List   Diagnosis Date Noted  . Unilateral complete AKA ([place]) 04/19/2015  . Dry gangrene ([place])   . Right leg pain 04/14/2015  . Cough 08/12/2014  . Bilateral hand pain 05/29/2014  . Acne 03/30/2014  . S/P bilateral BKA (below knee amputation) ([place]) 01/22/2014  . Cocaine abuse 12/05/2013  . Tobacco abuse 12/04/2013  . Peripheral vascular disease ([place])   . Atrial fibrillation ([place]) 12/03/2013  . 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 History of colonic polyps 05/29/2013  . Diabetic retinopathy ([place]) 12/25/2012  . HLD (hyperlipidemia) 12/08/2012  . Peripheral neuropathy ([place]) 11/19/2011  . ETOH abuse 03/29/2011  . HTN (hypertension) 12/22/2010  . Diabetes mellitus type II, uncontrolled ([place]) 07/18/2006  . VENOUS INSUFFICIENCY 07/18/2006    Past Medical History:  Past Medical History  Diagnosis Date  . Uncontrolled diabetes mellitus ([place])   . HTN (hypertension)   . History of DVT (deep vein thrombosis)   . Personal history of diabetic foot ulcer   . Peripheral vascular disease ([place])     a. Abnl ABIs 2014.  Marland Kitchen Peripheral neuropathy ([place]) 11/19/2011  . Varicose vein     legs  . Osteomyelitis of foot, right, acute ([place]) 08/30/2012  . Personal history of colonic adenomas 05/29/2013  . History of cocaine use   . PAF (paroxysmal atrial fibrillation) ([place])     a. Dx 11/2013 in setting of sepsis/foot infection.  . Tobacco abuse   . H/O ETOH abuse   . Dysrhythmia     afib last admission  . Seasonal allergies    Past Surgical History:  Past Surgical History  Procedure Laterality Date  . Amputation  04/21/2011     Procedure: AMPUTATION RAY;  Surgeon: Newt Minion, MD;  Location: [facility];  Service: Orthopedics;  Laterality: Right;  Rt foot 2nd ray ampt  . I&d extremity  05/03/2011    Procedure: IRRIGATION AND DEBRIDEMENT EXTREMITY;  Surgeon: Newt Minion, MD;  Location: [facility];  Service: Orthopedics;  Laterality: Right;  Irrigation and Debridement Right Foot and Place Acell Xenograft  . Toe amputation  04/24/2012    great toe   right foot  . Amputation  04/24/2012    Procedure: AMPUTATION RAY;  Surgeon: Newt Minion, MD;  Location: [facility];  Service: Orthopedics;  Laterality: Right;  Right foot 1st ray amputation  . Amputation Left 04/23/2013    Procedure: AMPUTATION RAY;  Surgeon: Newt Minion, MD;  Location: [facility];  Service: Orthopedics;  Laterality: Left;  Left Foot 5th Ray Amputation  . Colonoscopy    . Amputation Left 11/21/2013    Procedure: AMPUTATION RAY;  Surgeon: Newt Minion, MD;  Location: [facility];  Service: Orthopedics;  Laterality: Left;  Left Foot 1st & 2nd Ray Amputation  . Amputation Right 12/04/2013    Procedure: AMPUTATION BELOW KNEE;  Surgeon: Marianna Payment, MD;  Location: [facility];  Service: Orthopedics;  Laterality: Right;  . Amputation Left 12/29/2013    Procedure: LEFT AMPUTATION BELOW KNEE;  Surgeon: Marianna Payment, MD;  Location: [facility];  Service: Orthopedics;  Laterality: Left;  . Amputation Right 04/15/2015  Procedure: AMPUTATION ABOVE KNEE, RIGHT;  Surgeon: Newt Minion, MD;  Location: [facility];  Service: Orthopedics;  Laterality: Right;    Assessment & Plan Clinical Impression: Patient is a 54 y.o. right handed male with history of hypertension, PAF maintained on xarelto, tobacco, alcohol abuse, polysubstance abuse, diabetes mellitus , peripheral neuropathy and unilateral BKA. Patient with recent right BKA 12/04/2013 due to gangrenous changes and received inpatient rehabilitation services 12/09/2013 until 12/18/2013. He was discharged to home with home health therapies. Lives  alone uses bilateral BKA prosthesis provided by a biotech and rolling walker prior to admission in a one level apartment with a ramped entrance. He would use an electric wheelchair when out and about in the community. Presented 04/14/2015 with ischemic gangrenous changes of right below-knee amputation site. No relief with conservative care. A urine drug screen was positive for cocaine. 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 Sharol Given. Hospital course complicated by pain management. Xarelto ongoing as prior to admission.  Patient transferred to CIR on 04/19/2015 .   Patient currently requires min with mobility secondary to muscle weakness, R limb pain (physiological and phantom) and decreased sitting balance, decreased postural control and decreased balance strategies.  Prior to hospitalization, patient was modified independent  with mobility and lived with Alone (girlfriend can assist during the day) in a [place] home.  Home access is  Ramped entrance.  Patient will benefit from skilled PT intervention to maximize safe functional mobility, minimize fall risk and decrease caregiver burden for planned discharge home with intermittent assist.  Anticipate patient will benefit from follow up [facility] at discharge.  PT - End of Session Activity Tolerance: Decreased this session Endurance Deficit: Yes Endurance Deficit Description: pain in RLE PT Assessment Rehab Potential (ACUTE/IP ONLY): Good PT Patient demonstrates impairments in the following area(s): Balance;Endurance;Motor;Pain;Sensory PT Transfers Functional Problem(s): Bed Mobility;Bed to Chair;Car;Furniture PT Locomotion Functional Problem(s): Wheelchair Mobility PT Plan PT Intensity: Minimum of 1-2 x/day ,45 to 90 minutes PT Frequency: 5 out of 7 days PT Duration Estimated Length of Stay: 5-7 days PT Treatment/Interventions: Balance/vestibular training;Discharge planning;Disease management/prevention;DME/adaptive equipment instruction;Functional mobility  training;Neuromuscular re-education;Pain management;Patient/family education;Psychosocial support;Skin care/wound management;Therapeutic Activities;Therapeutic Exercise;UE/LE Strength taining/ROM;Wheelchair propulsion/positioning PT Transfers Anticipated Outcome(s): Mod I PT Locomotion Anticipated Outcome(s): Mod I w/c level PT Recommendation Follow Up Recommendations: Home health PT Patient destination: Home Equipment Recommended: None recommended by PT Equipment Details: Pt owns power w/c, RW  Skilled Therapeutic Intervention Pt participated in PT evaluation; see below.  Pt performed w/c mobility as below to/from gym x 150' with supervision and verbal cues to sequence changes in direction and set up for transfers.  Pt performed transfers w/c <> mat level transfer with min A with verbal cues for safe hand placement, trunk position and sequencing.  Once on mat pt transitioned to supine to perform RLE ROM and strengthening exercises and to assist with pain management.  Pt continued to c/o significant throbbing in R limb.  Pt educated on desensitization techniques and positioning of RLE for pain management and prevention of contractures.  Also discussed the role of compression wrapping for shaping, edema and pain management.  Pt agreeable to wrap RLE.  Performed rolling on flat mat to L and R to doff/don pants with min A due to pain; demonstrated to pt how to effectively wrap R AKA.  Pants donned and pt performed supine > sit mod I with extra time.  Returned to w/c and to room with supervision-min A.  Pt continued to place tall towel roll under  R limb for support.  Obtained and fit w/c with amputee support pad on R side to provide support to R limb without increasing hip flexion.  Discussed LOS and goals with pt; pt verbalized agreement.  Pt left with all items within reach to rest until OT session.  PT Evaluation Precautions/Restrictions Precautions Precautions: Fall Precaution Comments: prostheses  are at the hospital, but not the inserts Restrictions Weight Bearing Restrictions: Yes RLE Weight Bearing: Non weight bearing General Chart Reviewed: Yes Response to Previous Treatment: Other (Comment) (reporting pain but willing to participate) Family/Caregiver Present: No  Vital SignsTherapy Vitals Pulse Rate: 94 Resp: 20 BP: (!) 158/84 mmHg Patient Position (if appropriate): Lying Oxygen Therapy SpO2: 99 % O2 Device: Not Delivered Pain Pain Assessment Pain Assessment: 0-10 Pain Score: 8  Pain Type: Phantom pain;Surgical pain Pain Location: Leg Pain Orientation: Right Pain Descriptors / Indicators: Throbbing Pain Onset: On-going Pain Intervention(s): Other (Comment);RN made aware (compression wrapping) Home Living/Prior Functioning Home Living Available Help at Discharge: Available PRN/intermittently;Family;Friend(s) Type of Home: Apartment Home Access: Ramped entrance Home Layout: One level Bathroom Shower/Tub: Chiropodist: Standard Bathroom Accessibility: Yes  Lives With: Alone (girlfriend can assist during the day) Prior Function Level of Independence: Independent with transfers;Requires assistive device for independence;Independent with gait  Able to Take Stairs?: Yes (with bilat prosthetics and assistance) Driving: No Comments: drove electric w/c to nearby store for groceries; used transportation services or sister transported pt in [place] for appointments Vision/Perception  Perception Comments: WFL  Cognition Overall Cognitive Status: Within Functional Limits for tasks assessed Arousal/Alertness: Awake/alert Orientation Level: Oriented X4 Attention: Selective Selective Attention: Appears intact Awareness: Appears intact Safety/Judgment: Appears intact Sensation Sensation Light Touch: Impaired Detail Light Touch Impaired Details: Impaired RLE Stereognosis: Appears Intact Hot/Cold: Appears Intact Coordination Gross Motor Movements are  Fluid and Coordinated: Yes Fine Motor Movements are Fluid and Coordinated: Yes Motor  Motor Motor: Abnormal postural alignment and control Motor - Skilled Clinical Observations: generalized weakness; in sitting pt presents with posterior/lateral lean/bias due to pain in RLE  Mobility Bed Mobility Bed Mobility: Sit to Supine;Supine to Sit;Rolling Left;Rolling Right Rolling Right: 4: Min assist Rolling Left: 4: Min assist Supine to Sit: 6: Modified independent (Device/Increase time) Sit to Supine: 6: Modified independent (Device/Increase time) Transfers Transfers: Yes Lateral/Scoot Transfers: 4: Min assist;With armrests removed Lateral/Scoot Transfer Details (indicate cue type and reason): Pt performed lateral scooting w/c <> mat level transfer with min A at trunk to facilitate anterior lean/flexion to unweight buttocks and assistance to maintain w/c placement.  Verbal cues needed for safety and technique Locomotion  Ambulation Ambulation: No Gait Gait: No Stairs / Additional Locomotion Stairs: No Wheelchair Mobility Wheelchair Mobility: Yes Wheelchair Assistance: 5: Careers information officer: Both upper extremities Wheelchair Parts Management: Needs assistance Distance: 150' with verbal cues for sequencing for changes in direction and set up for transfers.  Cues for w/c parts management  Trunk/Postural Assessment  Cervical Assessment Cervical Assessment: Within Functional Limits Thoracic Assessment Thoracic Assessment: Within Functional Limits Lumbar Assessment Lumbar Assessment: Within Functional Limits Postural Control Postural Control: Deficits on evaluation (pt with posterior/lateral bias due to pain)  Balance Balance Balance Assessed: Yes Static Sitting Balance Static Sitting - Balance Support: Right upper extremity supported;Left upper extremity supported Static Sitting - Level of Assistance: 6: Modified independent (Device/Increase time) Dynamic Sitting  Balance Dynamic Sitting - Balance Support: Right upper extremity supported;Left upper extremity supported Dynamic Sitting - Level of Assistance: 5: Stand by assistance Dynamic Sitting Balance - Compensations: posterior/lateral  bias due to pain; cues for anterior leaning Sitting balance - Comments: able to sit unsupported with UEs free for ADL Extremity Assessment  RUE Assessment RUE Assessment: Within Functional Limits LUE Assessment LUE Assessment: Within Functional Limits RLE Assessment RLE Assessment: Exceptions to [facility] RLE Strength RLE Overall Strength: Deficits;Due to pain RLE Overall Strength Comments: 3/5 hip flexion/extension/ABD/ADD-limited due to pain LLE Assessment LLE Assessment: Within Functional Limits   See Function Navigator for Current Functional Status.   Refer to Care Plan for Long Term Goals  Recommendations for other services: Neuropsych  Discharge Criteria: Patient will be discharged from PT if patient refuses treatment 3 consecutive times without medical reason, if treatment goals not met, if there is a change in medical status, if patient makes no progress towards goals or if patient is discharged from hospital.  The above assessment, treatment plan, treatment alternatives and goals were discussed and mutually agreed upon: by patient  Raylene Everts [place] 04/20/2015, 10:59 AM

## 2015-04-20 NOTE — Progress Notes (Signed)
Occupational Therapy Session Note  Patient Details  Name: Steven [name] MRN: [id number] Date of Birth: April 02, 1961  Today's Date: 04/20/2015 OT Individual Time: 1115-1200 OT Individual Time Calculation (min): 45 min    Short Term Goals: Week 1:  OT Short Term Goal 1 (Week 1): LTG=STG  Skilled Therapeutic Interventions/Progress Updates: Therapeutic activity with focus on w/c mobility, pain management (desensitization training), toilet and tub bench transfers.   Pt received seated in w/c, rubbing RLE.   Pt was educated on desensitization but reports no interest in use of heat d/t history of burn from attempted use of heat pad at home from prior amputation.   Pt instructed on gentle tapping and rubbing.   Pt observed with ace wrap applied already but pt states he is ablewrap his limb, if needed.   With min vc pt attempted toilet transfer (unsuccessfully) and tub bench transfer (successfully).    OT advised use of drop-arm commode over toilet and use of AP transfer at bed level.    Pt left in w/c at end of session with all needs within reach.  Therapy Documentation Precautions:  Precautions Precautions: Fall Precaution Comments: prostheses are at the hospital, but not the inserts Restrictions Weight Bearing Restrictions: Yes RLE Weight Bearing: Non weight bearing    Pain: Pain Assessment Pain Assessment: 0-10 Pain Score: 8  Pain Type: Phantom pain;Surgical pain Pain Location: Leg Pain Orientation: Right Pain Descriptors / Indicators: Throbbing Pain Onset: On-going Pain Intervention(s): Other (Comment);RN made aware (compression wrapping)   ADL: ADL ADL Comments: see function navigator  See Function Navigator for Current Functional Status.   Therapy/Group: Individual Therapy   Second session: Time: 6948-5462 Time Calculation (min):  50 min  Pain Assessment: 11/10, with activity  Skilled Therapeutic Interventions: Therapeutic activity with focus on transfers, pain  management, and discharge planning.    Pt completes slide board transfer from w/c to car and back using slide board with setup to position w/c, steadying assist during transfer, and min vc for technique.   Pt completes toilet transfer with drop-arm BSC placed over toilet, steadying assist, extra time (approx 5 min to [facility] and 5 from), and min assist with w/c setup and problem-solving to manage RLE pain during transfer.    Pt reports need for hospital bed d/t sleeping on his couch PTA.  OT offered to alert SW to need although advising pt of possible out-of-pocket expense to rent bed.   See FIM for current functional status  Therapy/Group: Individual Therapy  [place] 04/20/2015, 12:07 PM

## 2015-04-21 ENCOUNTER — Inpatient Hospital Stay (HOSPITAL_COMMUNITY): Payer: Medicaid Other | Admitting: Occupational Therapy

## 2015-04-21 ENCOUNTER — Inpatient Hospital Stay (HOSPITAL_COMMUNITY): Payer: Medicaid Other

## 2015-04-21 DIAGNOSIS — Z89522 Acquired absence of left knee: Secondary | ICD-10-CM

## 2015-04-21 DIAGNOSIS — Z89611 Acquired absence of right leg above knee: Secondary | ICD-10-CM

## 2015-04-21 LAB — GLUCOSE, CAPILLARY
GLUCOSE-CAPILLARY: 196 mg/dL — AB (ref 65–99)
GLUCOSE-CAPILLARY: 143 mg/dL — AB (ref 65–99)
Glucose-Capillary: 175 mg/dL — ABNORMAL HIGH (ref 65–99)
Glucose-Capillary: 236 mg/dL — ABNORMAL HIGH (ref 65–99)

## 2015-04-21 LAB — URINE CULTURE

## 2015-04-21 MED ORDER — OXYCODONE HCL ER 10 MG PO T12A
10.0000 mg | EXTENDED_RELEASE_TABLET | Freq: Two times a day (BID) | ORAL | Status: DC
  Administered 2015-04-21 – 2015-04-27 (×13): 10 mg via ORAL
  Filled 2015-04-21 (×13): qty 1

## 2015-04-21 MED ORDER — GABAPENTIN 400 MG PO CAPS
700.0000 mg | ORAL_CAPSULE | Freq: Three times a day (TID) | ORAL | Status: DC
  Administered 2015-04-21 – 2015-04-27 (×17): 700 mg via ORAL
  Filled 2015-04-21 (×34): qty 1

## 2015-04-21 NOTE — Progress Notes (Signed)
[facility] Individual Statement of Services  Patient Name:  Steven [name]  Date:  04/21/2015  Welcome to the [facility].  Our goal is to provide you with an individualized program based on your diagnosis and situation, designed to meet your specific needs.  With this comprehensive rehabilitation program, you will be expected to participate in at least 3 hours of rehabilitation therapies Monday-Friday, with modified therapy programming on the weekends.  Your rehabilitation program will include the following services:  Physical Therapy (PT), Occupational Therapy (OT), 24 hour per day rehabilitation nursing, Neuropsychology, Case Management (Social Worker), Rehabilitation Medicine, Nutrition Services and Pharmacy Services  Weekly team conferences will be held on Wednesdays to discuss your progress.  Your Social Worker will talk with you frequently to get your input and to update you on team discussions.  Team conferences with you and your family in attendance may also be held.  Expected length of stay:  5 to 7 days  Overall anticipated outcome:  Modified Independent with supervision for car transfers and minimal assistance for standing with prosthesis  Depending on your progress and recovery, your program may change. Your Social Worker will coordinate services and will keep you informed of any changes. Your Social Worker's name and contact numbers are listed  below.  The following services may also be recommended but are not provided by the [facility] will be made to provide these services after discharge if needed.  Arrangements include referral to agencies that provide these services.  Your insurance has been verified to be:  Medicaid Your primary doctor is:  Dr. Chrisandra Netters  Pertinent information will be shared  with your doctor and your insurance company.  Social Worker:  Alfonse Alpers, LCSW  [phone] or ([phone]  Information discussed with and copy given to patient by: Trey Sailors, 04/21/2015, 10:43 AM

## 2015-04-21 NOTE — Progress Notes (Signed)
Social Work Assessment and Plan  Patient Details  Name: Steven [name] MRN: [id number] Date of Birth: November 23, 1960  Today's Date: 04/21/2015  Problem List:  Patient Active Problem List   Diagnosis Date Noted  . Unilateral complete AKA ([place]) 04/19/2015  . Dry gangrene ([place])   . Right leg pain 04/14/2015  . Cough 08/12/2014  . Bilateral hand pain 05/29/2014  . Acne 03/30/2014  . S/P bilateral BKA (below knee amputation) ([place]) 01/22/2014  . Cocaine abuse 12/05/2013  . Tobacco abuse 12/04/2013  . Peripheral vascular disease ([place])   . Atrial fibrillation ([place]) 12/03/2013  . 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 History of colonic polyps 05/29/2013  . Diabetic retinopathy ([place]) 12/25/2012  . HLD (hyperlipidemia) 12/08/2012  . Peripheral neuropathy ([place]) 11/19/2011  . ETOH abuse 03/29/2011  . HTN (hypertension) 12/22/2010  . Diabetes mellitus type II, uncontrolled ([place]) 07/18/2006  . VENOUS INSUFFICIENCY 07/18/2006   Past Medical History:  Past Medical History  Diagnosis Date  . Uncontrolled diabetes mellitus ([place])   . HTN (hypertension)   . History of DVT (deep vein thrombosis)   . Personal history of diabetic foot ulcer   . Peripheral vascular disease ([place])     a. Abnl ABIs 2014.  Marland Kitchen Peripheral neuropathy ([place]) 11/19/2011  . Varicose vein     legs  . Osteomyelitis of foot, right, acute ([place]) 08/30/2012  . Personal history of colonic adenomas 05/29/2013  . History of cocaine use   . PAF (paroxysmal atrial fibrillation) ([place])     a. Dx 11/2013 in setting of sepsis/foot infection.  . Tobacco abuse   . H/O ETOH abuse   . Dysrhythmia     afib last admission  . Seasonal allergies    Past Surgical History:  Past Surgical History  Procedure Laterality Date  . Amputation  04/21/2011    Procedure: AMPUTATION RAY;  Surgeon: Newt Minion, MD;  Location: [facility];  Service: Orthopedics;  Laterality: Right;  Rt foot 2nd ray ampt  . I&d extremity  05/03/2011    Procedure: IRRIGATION AND  DEBRIDEMENT EXTREMITY;  Surgeon: Newt Minion, MD;  Location: [facility];  Service: Orthopedics;  Laterality: Right;  Irrigation and Debridement Right Foot and Place Acell Xenograft  . Toe amputation  04/24/2012    great toe   right foot  . Amputation  04/24/2012    Procedure: AMPUTATION RAY;  Surgeon: Newt Minion, MD;  Location: [facility];  Service: Orthopedics;  Laterality: Right;  Right foot 1st ray amputation  . Amputation Left 04/23/2013    Procedure: AMPUTATION RAY;  Surgeon: Newt Minion, MD;  Location: [facility];  Service: Orthopedics;  Laterality: Left;  Left Foot 5th Ray Amputation  . Colonoscopy    . Amputation Left 11/21/2013    Procedure: AMPUTATION RAY;  Surgeon: Newt Minion, MD;  Location: [facility];  Service: Orthopedics;  Laterality: Left;  Left Foot 1st & 2nd Ray Amputation  . Amputation Right 12/04/2013    Procedure: AMPUTATION BELOW KNEE;  Surgeon: Marianna Payment, MD;  Location: [facility];  Service: Orthopedics;  Laterality: Right;  . Amputation Left 12/29/2013    Procedure: LEFT AMPUTATION BELOW KNEE;  Surgeon: Marianna Payment, MD;  Location: [facility];  Service: Orthopedics;  Laterality: Left;  . Amputation Right 04/15/2015    Procedure: AMPUTATION ABOVE KNEE, RIGHT;  Surgeon: Newt Minion, MD;  Location: [facility];  Service: Orthopedics;  Laterality: Right;   Social History:  reports that he has been smoking  Cigarettes.  He started smoking about 31 years ago. He has a 15 pack-year smoking history. He has never used smokeless tobacco. He reports that he drinks about 3.0 - 3.6 oz of alcohol per week. He reports that he does not use illicit drugs.  Family / Support Systems Marital Status: Single Patient Roles: Other (Comment) (brother) Other Supports: [name] - sister - [phone]; girlfriend Anticipated Caregiver: self Ability/Limitations of Caregiver: Sister works. Caregiver Availability: Other (Comment) (Has mod I goals and sister and girlfriend can help sometimes.) Family  Dynamics: Pt has good support from girlfriend  Social History Preferred language: English Religion: None Education: did not finish school Read: No (reports he cannot read that well.) Write: No Date Retired/Disabled/Unemployed: many years ago - pt reported since the 1980s in [facility] CSW assessment from July 2015 Legal History/Current Legal Issues: none reported Guardian/Conservator: N/A - MD assessed that pt is capable of making his own decisions   Abuse/Neglect Physical Abuse: Denies Verbal Abuse: Denies Sexual Abuse: Denies Exploitation of patient/patient's resources: Denies Self-Neglect: Denies  Emotional Status Pt's affect, behavior and adjustment status: Pt was in a lot of pain during CSW visit, but he stated that he is doing okay emotionally and knows what to expect due to previous amputations.  Pt is experiencing a lot of phantom pain. Recent Psychosocial Issues: Pt reports feeling well emotionally, but the phantom pain is causing him distress. Psychiatric History: none reported Substance Abuse History: Pt reported no substance abuse, but his UDS was positive for cocaine.  After CSW asked him about this, pt admitted to using cocaine on an occasion to relieve the pain in his leg.  He stated he does not need help with substance abuse resources.  Patient / Family Perceptions, Expectations & Goals Pt/Family understanding of illness & functional limitations: Pt reported a good understanding of his medical condition and said he does not have any outstanding questions for the medical team. Premorbid pt/family roles/activities: Pt has been keeping up his apartment and likes to do word search puzzles. Anticipated changes in roles/activities/participation: Pt has been taking care of his apartment.  He feels he may need assistance with cleaning. Pt/family expectations/goals: Pt wants to be able to take care of himself and not be in pain.  Community Resources [employer]: Other (Comment)  (Medicaid and DSS service and Partnership for [facility]) Premorbid Home Care/DME Agencies: Other (Comment) ([place] in the past)  Pt has a 3-in-1 commode; tub bench; crutches; rolling walker; and electric scooter Transportation available at discharge: pt's sister or girlfriend Resource referrals recommended: Neuropsychology Visual merchandiser Amputee Support Group)  Discharge Planning Living Arrangements: Alone Support Systems: Other relatives, Friends/neighbors Type of Residence: Private residence Insurance Resources: Medicaid (specify county) Sports coach) Financial Resources: SSD Financial Screen Referred: No Living Expenses: Education officer, community Management: Patient Does the patient have any problems obtaining your medications?: No Home Management: Pt has been doing this, but feels he may need some help at d/c.  CSW will make a PCS referral closer to pt's d/c. Patient/Family Preliminary Plans: Pt plans to go to his home and have intermittent support from his girlfriend and sister. Barriers to Discharge: Self care Social Work Anticipated Follow Up Needs: HH/OP, Support Group Expected length of stay: 5 to 7 days  Clinical Impression CSW met with pt to introduce self and role of CSW, as well as to complete assessment.  Pt was talkative with CSW, but was experiencing a lot of phantom pain and asked CSW to read his handout  on phantom pain to him so he could know more about it.  CSW also let RN, Maudry Mayhew, know that pt was still having pain.  She was to check to see if he could have any medication prior to therapy.  Pt has a lot of DME at home, but feels he needs a new cushion for his electric w/c.  He cannot remember who he received it from or how old it is.  He thinks his sister could go by his apt and look at the chair for a sticker with company name.  CSW will talk with sister about this and then try to contact the company to inquire about a replacement seat of some kind.  Pt was connected with  Partnership for [facility] and CSW left a message for them to see if they are still active with him.  Pt had [place] in the past and would like to continue with them for [facility].  CSW will continue to follow and assist as needed.  Ryler Laskowski, Silvestre Mesi 04/21/2015, 12:45 PM

## 2015-04-21 NOTE — H&P (Addendum)
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 The patient's history has been reviewed, patient examined, and there is no change in status. Patient continues to be appropriate for intensive inpatient rehabilitation. I have reviewed the patient's chart and labs. Questions were answered to the patient's satisfaction. The PAPE has been reviewed and assessment remains appropriate.  Jaret Coppedge Lorie Phenix, MD        [place] [facility]blood Lorie Phenix, MD Physician Signed Physical Medicine and Rehabilitation H&P Alen Bleacher) 04/19/2015 3:38 PM    Expand All Collapse All   Expand All Collapse All     Physical Medicine and Rehabilitation Admission H&P   Chief Complaint  Patient presents with  . R-BKA with gangrenous changes requiring revision to R-AKA   HPI: [name] is a 54 y.o. male with history of HTN, PAF- on xarelto, polysubstance abuse--UDS positive for cocaine, DM type 2 with peripheral neuropathy, PAD with B-BKA who was admitted on 04/14/15 with 3 week history of severe pain and coolness of R-BKA site. 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 He had no relief with conservative care. Dr. Sharol Given recommended revision to BKA. Patient underwent R-AKA on 11/03 and Gabapentin increased to help manage neuropathic pain. PT/OT evaluations completed and CIR recommended for follow up therapy.   Review of Systems  HENT: Negative for congestion and hearing loss.  Eyes: Negative for blurred vision and double vision.  Respiratory: Positive for cough. Negative for shortness of breath and wheezing.  Cardiovascular: Negative for chest pain and palpitations.  Gastrointestinal: Positive for constipation (hasn't had a BM yet). Negative for heartburn, nausea and abdominal pain.  Genitourinary: Negative for dysuria and urgency.   Musculoskeletal: Positive for myalgias.  Neurological: Positive for sensory change (R-AKA hypersensitive to touch) and weakness. Negative for dizziness, tingling and headaches.  Psychiatric/Behavioral: The patient is nervous/anxious and has insomnia (up till 2 am and sleeps till 10 am daily).      Past Medical History  Diagnosis Date  . Uncontrolled diabetes mellitus ([place])   . HTN (hypertension)   . History of DVT (deep vein thrombosis)   . Personal history of diabetic foot ulcer   . Peripheral vascular disease ([place])     a. Abnl ABIs 2014.  Marland Kitchen Peripheral neuropathy ([place]) 11/19/2011  . Varicose vein     legs  . Osteomyelitis of foot, right, acute ([place]) 08/30/2012  . Personal history of colonic adenomas 05/29/2013  . History of cocaine use   . PAF (paroxysmal atrial fibrillation) ([place])     a. Dx 11/2013 in setting of sepsis/foot infection.  . Tobacco abuse   . H/O ETOH abuse   . Dysrhythmia     afib last admission  . Seasonal allergies     Past Surgical History  Procedure Laterality Date  . Amputation  04/21/2011    Procedure: AMPUTATION RAY; Surgeon: Newt Minion, MD; Location: [place]; Service: Orthopedics; Laterality: Right; Rt foot 2nd ray ampt  . I&d extremity  05/03/2011    Procedure: IRRIGATION AND DEBRIDEMENT EXTREMITY; Surgeon: Newt Minion, MD; Location: [place]; Service: Orthopedics; Laterality: Right; Irrigation and Debridement Right Foot and Place Acell Xenograft  . Toe amputation  04/24/2012    great toe right foot  . Amputation  04/24/2012    Procedure: AMPUTATION RAY; Surgeon: Newt Minion, MD; Location: [place]; Service: Orthopedics;  Laterality: Right; Right foot 1st ray amputation  . Amputation Left 04/23/2013    Procedure: AMPUTATION RAY; Surgeon:  Newt Minion, MD; Location: [place]; Service: Orthopedics; Laterality: Left; Left Foot 5th Ray Amputation  . Colonoscopy    . Amputation Left 11/21/2013    Procedure: AMPUTATION RAY; Surgeon: Newt Minion, MD; Location: [place]; Service: Orthopedics; Laterality: Left; Left Foot 1st & 2nd Ray Amputation  . Amputation Right 12/04/2013    Procedure: AMPUTATION BELOW KNEE; Surgeon: Marianna Payment, MD; Location: [place]; Service: Orthopedics; Laterality: Right;  . Amputation Left 12/29/2013    Procedure: LEFT AMPUTATION BELOW KNEE; Surgeon: Marianna Payment, MD; Location: [place]; Service: Orthopedics; Laterality: Left;  . Amputation Right 04/15/2015    Procedure: AMPUTATION ABOVE KNEE, RIGHT; Surgeon: Newt Minion, MD; Location: [place]; Service: Orthopedics; Laterality: Right;    Family History  Problem Relation Age of Onset  . Diabetes Mother   . Stroke Brother   . Colon cancer Neg Hx     Social History: Lives alone. Disabled due to medical issues. Independent PTA--used Wheelchair early am and B-prosthesis with walker to ambulate during the day. He reports that he has been smoking Cigarettes--1/2 PPD. He started smoking about 31 years ago. He has a 15 pack-year smoking history. He has never used smokeless tobacco. He reports that he drinks about 2 beers daily. He reports that he does uses cocaine intermittently.    Allergies: No Known Allergies    Medications Prior to Admission  Medication Sig Dispense Refill  . acetaminophen (TYLENOL) 325 MG tablet Take 2 tablets (650 mg total) by mouth every 6 (six) hours as needed. 60 tablet 1  . aspirin 325 MG tablet Take 325 mg by mouth daily.    . cetirizine (ZYRTEC) 10 MG tablet Take 10 mg by mouth daily.  0  . diltiazem (CARDIZEM CD) 120 MG 24 hr capsule Take 120 mg by mouth daily.     . insulin aspart (NOVOLOG) 100 UNIT/ML injection Inject 10-15 Units into the skin daily before supper. 1 vial PRN  . insulin glargine (LANTUS) 100 UNIT/ML injection Inject 0.45 mLs (45 Units total) into the skin daily before breakfast. 10 mL 6  . lisinopril (PRINIVIL,ZESTRIL) 20 MG tablet Take 1 tablet (20 mg total) by mouth daily. For blood pressure 90 tablet 0  . rivaroxaban (XARELTO) 20 MG TABS tablet Take 1 tablet (20 mg total) by mouth daily with supper. For Atrial Fibrillation 90 tablet 1  . atorvastatin (LIPITOR) 40 MG tablet Take 1 tablet (40 mg total) by mouth daily. (Patient not taking: Reported on 04/14/2015) 30 tablet 11  . fluticasone (FLONASE) 50 MCG/ACT nasal spray Place 2 sprays into both nostrils daily. (Patient not taking: Reported on 57/01/4695) 16 g 1  . folic acid (FOLVITE) 1 MG tablet Take 1 tablet (1 mg total) by mouth daily. (Patient not taking: Reported on 04/14/2015) 30 tablet 1  . gabapentin (NEURONTIN) 300 MG capsule Take 300mg  every morning, 300mg  every day at noon, and 600mg  every night at bedtime. (Patient not taking: Reported on 04/14/2015) 120 capsule 5  . nitroGLYCERIN (NITRODUR - DOSED IN MG/24 HR) 0.2 mg/hr patch Place 1 patch (0.2 mg total) onto the skin daily. (Patient not taking: Reported on 04/14/2015) 30 patch 5  . oxyCODONE-acetaminophen (PERCOCET) 5-325 MG tablet Take 1-2 tablets by mouth every 4 (four) hours as needed. (Patient not taking: Reported on 04/14/2015) 20 tablet 0  . pantoprazole (PROTONIX) 40 MG tablet Take 1 tablet (  40 mg total) by mouth at bedtime. (Patient not taking: Reported on 04/14/2015) 90 tablet 1  . [DISCONTINUED] cephALEXin (KEFLEX) 500 MG capsule Take 1 capsule (500 mg total) by mouth 3 (three) times daily. (Patient not taking: Reported on 04/14/2015) 30 capsule 0    Home: Home Living Family/patient expects to be  discharged to:: Private residence Living Arrangements: Alone Type of Home: Apartment Home Access: Ramped entrance [place]: One level Bathroom Shower/Tub: Chiropodist: (same height as his w/c) Bathroom Accessibility: Yes Home Equipment: Environmental consultant - 2 wheels, Wheelchair - power, Tub bench, Bedside commode  Functional History: Prior Function Level of Independence: Independent with assistive device(s) Gait / Transfers Assistance Needed: walked with bil BKA prostheses and RW just prior to adm; used w/c first thing in a.m. and before bed (transferring without prostheses either lateral scoot or anterior-posterior) Comments: drove electric w/c to nearby store for groceries; used transportation services for appointments  Functional Status: Mobility: Bed Mobility Overal bed mobility: Modified Independent General bed mobility comments: with HOB up, pivoted to R side with use of rail, also able to achieve long sitting Transfers Overall transfer level: Needs assistance Equipment used: None Transfers: Government social research officer transfers: Min assist General transfer comment: did not transfer, but able to scoot along EOB with min assist to reposition      ADL: ADL Overall ADL's : Needs assistance/impaired Eating/Feeding: Independent, Sitting Grooming: Wash/dry hands, Wash/dry face, Sitting, Supervision/safety Upper Body Bathing: Supervision/ safety, Sitting Lower Body Bathing: Moderate assistance, Sitting/lateral leans Upper Body Dressing : Supervision/safety, Sitting Lower Body Dressing: Moderate assistance, Sitting/lateral leans General ADL Comments: Pt declined OOB due to pain.  Cognition: Cognition Overall Cognitive Status: Within Functional Limits for tasks assessed Orientation Level: Oriented to person, Oriented to place, Oriented to situation, Disoriented to time Cognition Arousal/Alertness: Awake/alert Behavior During Therapy: WFL  for tasks assessed/performed Overall Cognitive Status: Within Functional Limits for tasks assessed   Blood pressure 140/73, pulse 71, temperature 97.9 F (36.6 C), temperature source Oral, resp. rate 18, height 6\' 4"  (1.93 m), weight 90.7 kg (199 lb 15.3 oz), SpO2 100 %. Physical Exam  Nursing note and vitals reviewed. Constitutional: He is oriented to person, place, and time. He appears well-developed and well-nourished.  HENT:  Head: Normocephalic and atraumatic.  Mouth/Throat: Oropharynx is clear and moist.  Eyes: EOM are normal. Pupils are equal, round, and reactive to light. Right conjunctiva is injected. Left conjunctiva is injected.  Neck: Normal range of motion. Neck supple.  Cardiovascular: Normal rate and regular rhythm.  Respiratory: Effort normal and breath sounds normal. No respiratory distress. He has no wheezes.  GI: Soft. Bowel sounds are normal. He exhibits no distension. There is no tenderness.  Musculoskeletal: He exhibits tenderness (R-AKA site).  L-BKA well healed and intact without breakdown.  Strength b/l UE: 5/5 throughout Right hip flexion 5/5 Left hip flexion, knee extension 5/5 Left BKA Right AKA  Neurological: He is alert and oriented to person, place, and time.  Skin: Skin is warm and dry.  Multiple healed lesions on back.  Post op dressing to right AKA. Left BKA is well healed  Psychiatric: He has a normal mood and affect. His speech is normal and behavior is normal. Cognition and memory are normal. He expresses impulsivity.     Lab Results Last 48 Hours    Results for orders placed or performed during the hospital encounter of 04/14/15 (from the past 48 hour(s))  Glucose, capillary Status: Abnormal   Collection Time: 04/17/15  4:49 PM  Result Value Ref Range   Glucose-Capillary 310 (H) 65 - 99 mg/dL  Glucose, capillary Status: Abnormal   Collection Time: 04/17/15 9:40 PM  Result Value  Ref Range   Glucose-Capillary 282 (H) 65 - 99 mg/dL  Glucose, capillary Status: Abnormal   Collection Time: 04/18/15 8:06 AM  Result Value Ref Range   Glucose-Capillary 225 (H) 65 - 99 mg/dL  Glucose, capillary Status: Abnormal   Collection Time: 04/18/15 12:05 PM  Result Value Ref Range   Glucose-Capillary 442 (H) 65 - 99 mg/dL  Glucose, capillary Status: Abnormal   Collection Time: 04/18/15 5:10 PM  Result Value Ref Range   Glucose-Capillary 151 (H) 65 - 99 mg/dL  Glucose, capillary Status: Abnormal   Collection Time: 04/18/15 9:35 PM  Result Value Ref Range   Glucose-Capillary 315 (H) 65 - 99 mg/dL  Glucose, capillary Status: Abnormal   Collection Time: 04/19/15 8:15 AM  Result Value Ref Range   Glucose-Capillary 184 (H) 65 - 99 mg/dL  Glucose, capillary Status: Abnormal   Collection Time: 04/19/15 12:23 PM  Result Value Ref Range   Glucose-Capillary 209 (H) 65 - 99 mg/dL      Imaging Results (Last 48 hours)    No results found.    Medical Problem List and Plan: 1. 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 DVT Prophylaxis/Anticoagulation: Pharmaceutical: Xarelto 3. Pain Management: Continue Oxycodone prn.  4. Mood: LCSW to follow for evaluation and support.  5. Neuropsych: This patient is capable of making decisions on his own behalf. 6. Skin/Wound Care: Routine pressure relief measures. Maintain adequate nutrition and hydration status. Add protein supplement due to low protein stores.  7. Fluids/Electrolytes/Nutrition: Monitor I/O. Check lytes in am.  8. DM type 2: Poorly controlled due to non-compliance with lantus. Discussed importance of medication compliance to help with wound healing. Will monitor BS ac/hs. Continue Lantus daily with meal coverage.  9. HTN: Monitor BP bid--poorly  controlled at this time and could be pain mediated. Continue Cardizem and lisinopril daily. Titrate medications as indicated.  10. PAF: Monitor HR bid and for any symptoms with activity. On cardizem CD dailyNo BB due to cocaine use. 11. ABLA: Add iron supplement. Recheck in am.  12. Constipation: Increase miralax to bid.  13. Hypokalemia: Likely dilutional. Will check labs in am.   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 1. Patient is admitted to receive collaborative, interdisciplinary care between the physiatrist, rehab nursing staff, and therapy team. 2. Patient's level of medical complexity and substantial therapy needs in context of that medical necessity cannot be provided at a lesser intensity of care such as a SNF. 3. Patient has experienced substantial functional loss from his/her baseline which was documented above under the "Functional History" and "Functional Status" headings. Judging by the patient's diagnosis, physical exam, and functional history, the patient has potential for functional progress which will result in measurable gains while on inpatient rehab. These gains will be of substantial and practical use upon discharge in facilitating mobility and self-care at the household level. 4. Physiatrist will provide 24 hour management of medical needs as well as oversight of the therapy plan/treatment and provide guidance as appropriate regarding the interaction of the two. 5. 24 hour rehab nursing will assist with safety, skin/wound care, disease management, medication administration, pain management and patient education and help integrate therapy concepts, techniques,education, etc. 6. PT will assess and treat for/with: Lower extremity strength, range of  motion, stamina, balance, functional mobility, safety, adaptive techniques and equipment, woundcare, coping skills, pain control, AKA pre-prosthetic training, education.  Goals are: Mod I. 7. OT will assess and treat for/with: ADL's, functional mobility, safety, upper extremity strength, adaptive techniques and equipment, wound mgt, ego support, and community reintegration. Goals are: Ind/Mod I. Therapy may not proceed with showering this patient. 8. Case Management and Social Worker will assess and treat for psychological issues and discharge planning. 9. Team conference will be held weekly to assess progress toward goals and to determine barriers to discharge. 10. Patient will receive at least 3 hours of therapy per day at least 5 days per week. 11. ELOS: 14-17 days.  12. Prognosis: good  Delice Lesch, MD  04/19/2015

## 2015-04-21 NOTE — Progress Notes (Signed)
54 y.o. male with history of HTN, PAF- on xarelto, polysubstance abuse--UDS positive for cocaine, DM type 2 with peripheral neuropathy, PAD with B-BKA who was admitted on 04/14/15 with 3 week history of severe pain and coolness of R-BKA site. 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 He had no relief with conservative care. Dr. Sharol Given recommended revision to BKA. Patient underwent R-AKA on 11/03 and Gabapentin increased to help manage neuropathic pain  Subjective/Complaints: Stump pain, states he didn't get meds this am No change in dressing since surgery 6 d ago ROS:   Pt without SOB, CP, Objective: Vital Signs: Blood pressure 146/73, pulse 87, temperature 98.1 F (36.7 C), temperature source Oral, resp. rate 18, height '6\' 4"'  (1.93 m), weight 91.581 kg (201 lb 14.4 oz), SpO2 97 %. No results found. Results for orders placed or performed during the hospital encounter of 04/19/15 (from the past 72 hour(s))  Glucose, capillary     Status: Abnormal   Collection Time: 04/19/15  9:20 PM  Result Value Ref Range   Glucose-Capillary 177 (H) 65 - 99 mg/dL  Urinalysis, Routine w reflex microscopic (not at [facility])     Status: Abnormal   Collection Time: 04/20/15 12:10 AM  Result Value Ref Range   Color, Urine YELLOW YELLOW   APPearance CLEAR CLEAR   Specific Gravity, Urine 1.016 1.005 - 1.030   pH 7.0 5.0 - 8.0   Glucose, UA 250 (A) NEGATIVE mg/dL   Hgb urine dipstick TRACE (A) NEGATIVE   Bilirubin Urine NEGATIVE NEGATIVE   Ketones, ur NEGATIVE NEGATIVE mg/dL   Protein, ur 100 (A) NEGATIVE mg/dL   Urobilinogen, UA 1.0 0.0 - 1.0 mg/dL   Nitrite NEGATIVE NEGATIVE   Leukocytes, UA NEGATIVE NEGATIVE  Urine microscopic-add on     Status: None   Collection Time: 04/20/15 12:10 AM  Result Value Ref Range   Squamous Epithelial / LPF RARE RARE   WBC, UA 0-2 <3 WBC/hpf   RBC / HPF 3-6 <3 RBC/hpf   Bacteria, UA RARE RARE  CBC WITH  DIFFERENTIAL     Status: Abnormal   Collection Time: 04/20/15  3:23 AM  Result Value Ref Range   WBC 8.0 4.0 - 10.5 K/uL   RBC 4.34 4.22 - 5.81 MIL/uL   Hemoglobin 12.4 (L) 13.0 - 17.0 g/dL   HCT 37.9 (L) 39.0 - 52.0 %   MCV 87.3 78.0 - 100.0 fL   MCH 28.6 26.0 - 34.0 pg   MCHC 32.7 30.0 - 36.0 g/dL   RDW 14.9 11.5 - 15.5 %   Platelets 357 150 - 400 K/uL   Neutrophils Relative % 63 %   Neutro Abs 5.0 1.7 - 7.7 K/uL   Lymphocytes Relative 28 %   Lymphs Abs 2.3 0.7 - 4.0 K/uL   Monocytes Relative 7 %   Monocytes Absolute 0.6 0.1 - 1.0 K/uL   Eosinophils Relative 2 %   Eosinophils Absolute 0.2 0.0 - 0.7 K/uL   Basophils Relative 0 %   Basophils Absolute 0.0 0.0 - 0.1 K/uL  Comprehensive metabolic panel     Status: Abnormal   Collection Time: 04/20/15  3:23 AM  Result Value Ref Range   Sodium 137 135 - 145 mmol/L   Potassium 4.0 3.5 - 5.1 mmol/L   Chloride 99 (L) 101 - 111 mmol/L   CO2 30 22 - 32 mmol/L   Glucose, Bld 125 (H) 65 - 99  mg/dL   BUN 12 6 - 20 mg/dL   Creatinine, Ser 0.87 0.61 - 1.24 mg/dL   Calcium 9.2 8.9 - 10.3 mg/dL   Total Protein 6.5 6.5 - 8.1 g/dL   Albumin 2.0 (L) 3.5 - 5.0 g/dL   AST 31 15 - 41 U/L   ALT 29 17 - 63 U/L   Alkaline Phosphatase 70 38 - 126 U/L   Total Bilirubin 0.4 0.3 - 1.2 mg/dL   GFR calc non Af Amer >60 >60 mL/min   GFR calc Af Amer >60 >60 mL/min    Comment: (NOTE) The eGFR has been calculated using the CKD EPI equation. This calculation has not been validated in all clinical situations. eGFR's persistently <60 mL/min signify possible Chronic Kidney Disease.    Anion gap 8 5 - 15  Glucose, capillary     Status: Abnormal   Collection Time: 04/20/15  6:47 AM  Result Value Ref Range   Glucose-Capillary 104 (H) 65 - 99 mg/dL  Glucose, capillary     Status: Abnormal   Collection Time: 04/20/15 11:26 AM  Result Value Ref Range   Glucose-Capillary 280 (H) 65 - 99 mg/dL  Glucose, capillary     Status: Abnormal   Collection Time:  04/20/15  4:22 PM  Result Value Ref Range   Glucose-Capillary 155 (H) 65 - 99 mg/dL  Glucose, capillary     Status: Abnormal   Collection Time: 04/20/15  9:09 PM  Result Value Ref Range   Glucose-Capillary 180 (H) 65 - 99 mg/dL  Glucose, capillary     Status: Abnormal   Collection Time: 04/21/15  6:46 AM  Result Value Ref Range   Glucose-Capillary 196 (H) 65 - 99 mg/dL     HEENT: normal Cardio: RRR and no murmurs Resp: CTA B/L and unlabored GI: BS positive and nontender nondistended Extremity:  Pulses positive and No Edema Skin:   Wound dried blood under dressing, mild maceration around incision midpoint. Staples intact Neuro: Alert/Oriented, Agitated and Abnormal Motor Normal motor in BUE and decreased R HF due to pain, Left HF 4/5 Musc/Skel:  Extremity tender Right stump, left stump non tender Gen NAD   Assessment/Plan: 1. 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 Physiatrist is providing close team supervision and 24 hour management of active medical problems listed below. Physiatrist and rehab team continue to assess barriers to discharge/monitor patient progress toward functional and medical goals. Team conference today please see physician documentation under team conference tab, met with team face-to-face to discuss problems,progress, and goals. Formulized individual treatment plan based on medical history, underlying problem and comorbidities. FIM: Function - Bathing Bathing activity did not occur:  (nursing assisted last night)  Function- Upper Body Dressing/Undressing What is the patient wearing?: Pull over shirt/dress Pull over shirt/dress - Perfomed by patient: Thread/unthread right sleeve, Thread/unthread left sleeve Assist Level: Set up Set up : To obtain clothing/put away Function - Lower Body Dressing/Undressing What is the patient wearing?:  Underwear Underwear - Performed by patient: Thread/unthread right underwear leg, Thread/unthread left underwear leg Pants- Performed by patient: Thread/unthread right pants leg, Thread/unthread left pants leg Pants- Performed by helper: Pull pants up/down Assist for footwear: Supervision/touching assist Assist for lower body dressing: Supervision or verbal cues Set up : To obtain clothing/put away     Function - Toilet Transfers Toilet transfer assistive device: Drop arm commode Assist level to toilet: Touching or steadying assistance (  Pt > 75%) Assist level from toilet: Touching or steadying assistance (Pt > 75%)  Function - Chair/bed transfer Chair/bed transfer method: Lateral scoot Chair/bed transfer assist level: Touching or steadying assistance (Pt > 75%) Chair/bed transfer assistive device: Armrests Chair/bed transfer details: Verbal cues for sequencing, Verbal cues for technique, Verbal cues for precautions/safety  Function - Locomotion: Wheelchair Will patient use wheelchair at discharge?: Yes Type: Motorized (patient has motor chair at home; manual in hospital) Max wheelchair distance: 150 Assist Level: Supervision or verbal cues Assist Level: Supervision or verbal cues Assist Level: Supervision or verbal cues Turns around,maneuvers to table,bed, and toilet,negotiates 3% grade,maneuvers on rugs and over doorsills: No Function - Locomotion: Ambulation Ambulation activity did not occur: Safety/medical concerns (bilateral amputee) Walk 10 feet activity did not occur: Safety/medical concerns Walk 50 feet with 2 turns activity did not occur: Safety/medical concerns Walk 150 feet activity did not occur: Safety/medical concerns Walk 10 feet on uneven surfaces activity did not occur: Safety/medical concerns  Function - Comprehension Comprehension: Auditory Comprehension assist level: Understands complex 90% of the time/cues 10% of the time  Function - Expression Expression:  Verbal Expression assist level: Expresses complex 90% of the time/cues < 10% of the time  Function - Social Interaction Social Interaction assist level: Interacts appropriately with others with medication or extra time (anti-anxiety, antidepressant).  Function - Problem Solving Problem solving assist level: Solves complex 90% of the time/cues < 10% of the time  Function - Memory Memory assist level: More than reasonable amount of time Patient normally able to recall (first 3 days only): Current season, Location of own room, Staff names and faces, That he or she is in a hospital  Medical Problem List and Plan: 1. Functional deficits secondary to Revision of R-BKA to AKA, Prior left BKA, Team conference today 2.  DVT Prophylaxis/Anticoagulation: Pharmaceutical: Xarelto 3. Pain Management:  Continue Oxycodone prn.   4. Mood: LCSW to follow for evaluation and support.   5. Neuropsych: This patient is capable of making decisions on his own behalf. 6. Skin/Wound Care: Routine pressure relief measures. Maintain adequate nutrition and hydration status. Add protein supplement due to low protein stores.   7. Fluids/Electrolytes/Nutrition: Monitor I/O. Check lytes in am.   8. DM type 2: Poorly controlled due to non-compliance with lantus. Discussed importance of medication compliance to help with wound healing. Will monitor BS ac/hs. Continue Lantus daily with meal coverage.   9. HTN: Monitor BP bid--poorly controlled at this time and could be pain mediated. Continue Cardizem and lisinopril daily. Titrate medications as indicated.   10. PAF: Monitor HR bid and for any symptoms with activity. On cardizem CD daily No BB ,  May have been due to cocaine use. 11. ABLA: Add iron supplement. Recheck in am.   12. Constipation: Increase miralax to bid.   13. Hypokalemia: Likely dilutional. Will check labs in am.     LOS (Days) 2 A FACE TO FACE EVALUATION WAS PERFORMED  Chevelle Coulson E 04/21/2015, 8:17  AM

## 2015-04-21 NOTE — Progress Notes (Signed)
Physical Therapy Session Note  Patient Details  Name: Steven [name] MRN: [id number] Date of Birth: 1960/12/05  Today's Date: 04/21/2015 PT Individual Time: Session 1: 1010-1105; Session 2: 1330-1430 PT Individual Time Calculation (min): Session 1: 55 min; Session 2: 60 min  Short Term Goals: Week 1:  PT Short Term Goal 1 (Week 1): = LTG due to short LOS  Skilled Therapeutic Interventions/Progress Updates:    Session 1: Patient in pain initially and had pain medications about 20 min earlier, waited 20 min, but still in pain so switched to later session than scheduled so pt able to tolerate treatment.  In bed therex as noted below.  Patient rolled and side to sit with rail supervision.  Demonstrated sitting balance no UE support independent.  Attempted to don prosthesis on left LE without success and left liner on for possible assist for shrinker.  Arm bike on b/s table tx 2 x 2 min sessions forward and 1 x 2 min session in reverse.  Pt's sister arrived with clothing during session so assisted with pt rolling for hygiene and to place diaper on pt in prep for OT session for bathing and dressing from wheelchair level.  Noted skin flaking off right buttocks and with red area RN aware.  Patient left in supine with OT for next session.   Session 2:  Patient in wheelchair.  Attempt again to don prosthesis after wearing liner without success.  Pt assisted to place leg rests on chair and pt propelled to gym using bilat UE's and increased time.  Patient transferred to mat via lateral scoot without board at his request and supervision.  Patient supine for SLR, hip adduction with pillow between knees.  Noted dressing off end of residual limb and removed from pant leg.  Assist to roll up pant leg and place new gauze due to incision site still draining.  Pt in pain so propelled back to room and transferred back to bed with use of rails and armrests with increased time.  Positioned with pillow under R hip and leg  for elevation due to edema.  RN informed pt needs new dressing and rewrapping when able to remove pants.  Called [employer] for left LE and/or some prosthesis adjustment.    Therapy Documentation Precautions:  Precautions Precautions: Fall Precaution Comments: prostheses are at the hospital, but not the inserts Restrictions Weight Bearing Restrictions: Yes RLE Weight Bearing: Non weight bearing General: PT Amount of Missed Time (min): 20 Minutes PT Missed Treatment Reason: Pain  Pain: Pain Assessment Pain Assessment: 0-10 Pain Score: 4  Faces Pain Scale: Hurts even more Pain Type: Acute pain;Surgical pain Pain Location: Leg Pain Orientation: Right Pain Descriptors / Indicators: Sharp;Tender Pain Frequency: Constant Pain Onset: With Activity Patients Stated Pain Goal: 3 Pain Intervention(s): Repositioned;Rest;Elevated extremity    Exercises: Total Joint Exercises Bridges: Strengthening;Both;Supine;20 reps (thighs on bolster) Amputee Exercises Quad Sets: Strengthening;Both;20 reps;Supine Hip Extension: Sidelying;10 reps;Right;Strengthening Hip ABduction/ADduction: AROM;10 reps;Supine;Right;Sidelying (performed both supine and sidelying)   See Function Navigator for Current Functional Status.   Therapy/Group: Individual Therapy  [place], [place] 04/21/2015  04/21/2015, 4:44 PM

## 2015-04-21 NOTE — Progress Notes (Signed)
Occupational Therapy Session Note  Patient Details  Name: Steven [name] MRN: [id number] Date of Birth: 1961/05/27  Today's Date: 04/21/2015 OT Individual Time: 1100-1200 OT Individual Time Calculation (min): 60 min    Short Term Goals: Week 1:  OT Short Term Goal 1 (Week 1): LTG=STG  Skilled Therapeutic Interventions/Progress Updates:    Pt seen for OT therapy session focusing on functional transfers and ADL re-training. Pt in supine upon arrival with hand off from PT. Pt voicing increased complaints of pain, however, willing to attempt therapy. Pericare/ buttock hygiene completed prior to session. He donned pants in supine, rolling to pull pants up. UB bathing/dressing completed at sink with set-up.  He completed functional transfers w/[name] <> mat via lateral scoot without board and w/[name] <> mat via anterior/ posterior. VCs required for sequencing/ technique and safety awareness. Education provided regarding importance of clearing butt during transfer for skin protection. While on mat, pt complete leg lifts, x10 on B sides, demonstrating good functional sitting balance. Pt returned to room at end of session, left sitting up in w/[name] with all needs in reach and RN prsent.  Pt educated regarding pain management techniques including tapping and rubbing, skin protection, and d/[name] planning.   Therapy Documentation Precautions:  Precautions Precautions: Fall Precaution Comments: prostheses are at the hospital, but not the inserts Restrictions Weight Bearing Restrictions: Yes RLE Weight Bearing: Non weight bearing Pain: Pain Assessment Pain Score: 8  Pain Location: Leg Pain Orientation: Right Pain Intervention(s): Medication (See eMAR), repositioned, increased activity ADL: ADL ADL Comments: see function navigator  See Function Navigator for Current Functional Status.   Therapy/Group: Individual Therapy  [name], Steven [name] 04/21/2015, 7:12 AM

## 2015-04-22 ENCOUNTER — Inpatient Hospital Stay (HOSPITAL_COMMUNITY): Payer: Medicaid Other

## 2015-04-22 ENCOUNTER — Encounter (HOSPITAL_COMMUNITY): Payer: Medicaid Other

## 2015-04-22 DIAGNOSIS — E1310 Other specified diabetes mellitus with ketoacidosis without coma: Secondary | ICD-10-CM

## 2015-04-22 DIAGNOSIS — Z794 Long term (current) use of insulin: Secondary | ICD-10-CM

## 2015-04-22 DIAGNOSIS — Z89512 Acquired absence of left leg below knee: Secondary | ICD-10-CM

## 2015-04-22 LAB — GLUCOSE, CAPILLARY
GLUCOSE-CAPILLARY: 237 mg/dL — AB (ref 65–99)
GLUCOSE-CAPILLARY: 129 mg/dL — AB (ref 65–99)
Glucose-Capillary: 129 mg/dL — ABNORMAL HIGH (ref 65–99)
Glucose-Capillary: 98 mg/dL (ref 65–99)

## 2015-04-22 IMAGING — CR DG FOOT COMPLETE 3+V*R*
3 series · 3 of 3 positions shown · non-contrast
Comparison: Radiographs 08/30/2012 and MRI 03/07/2013.

CLINICAL DATA: Pain and swelling.

EXAM:
RIGHT FOOT COMPLETE - 3+ VIEW

[x foot ap right]
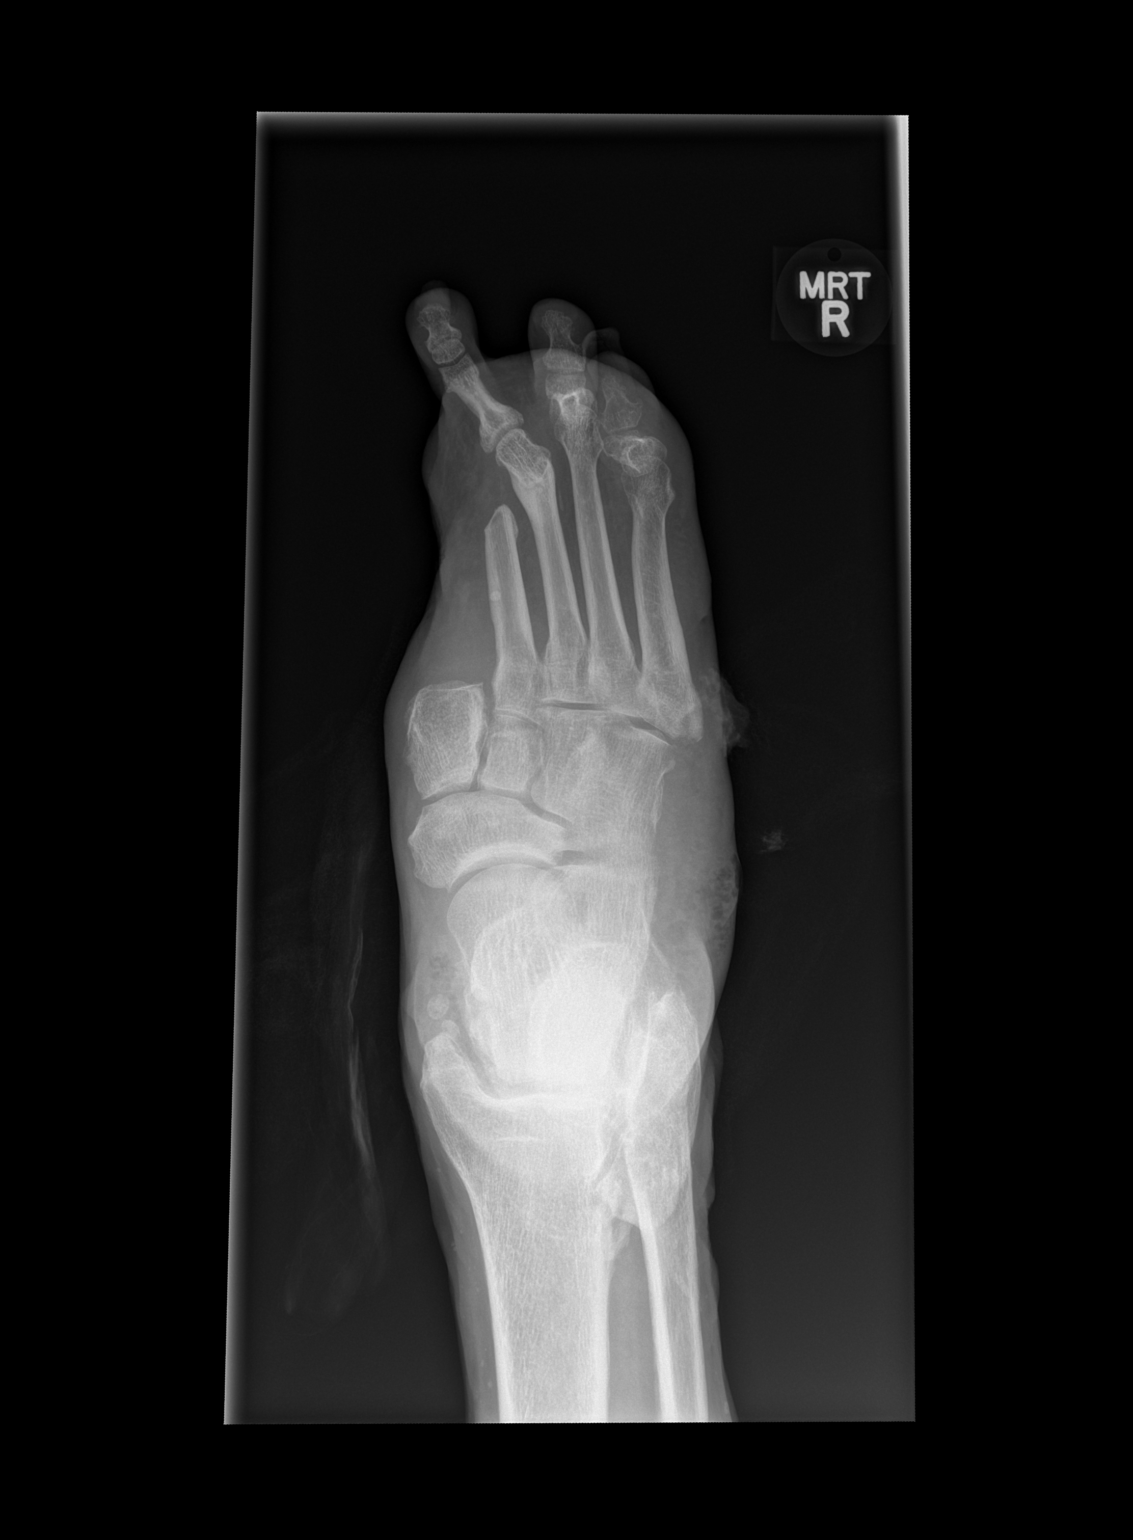

[x foot obl right]
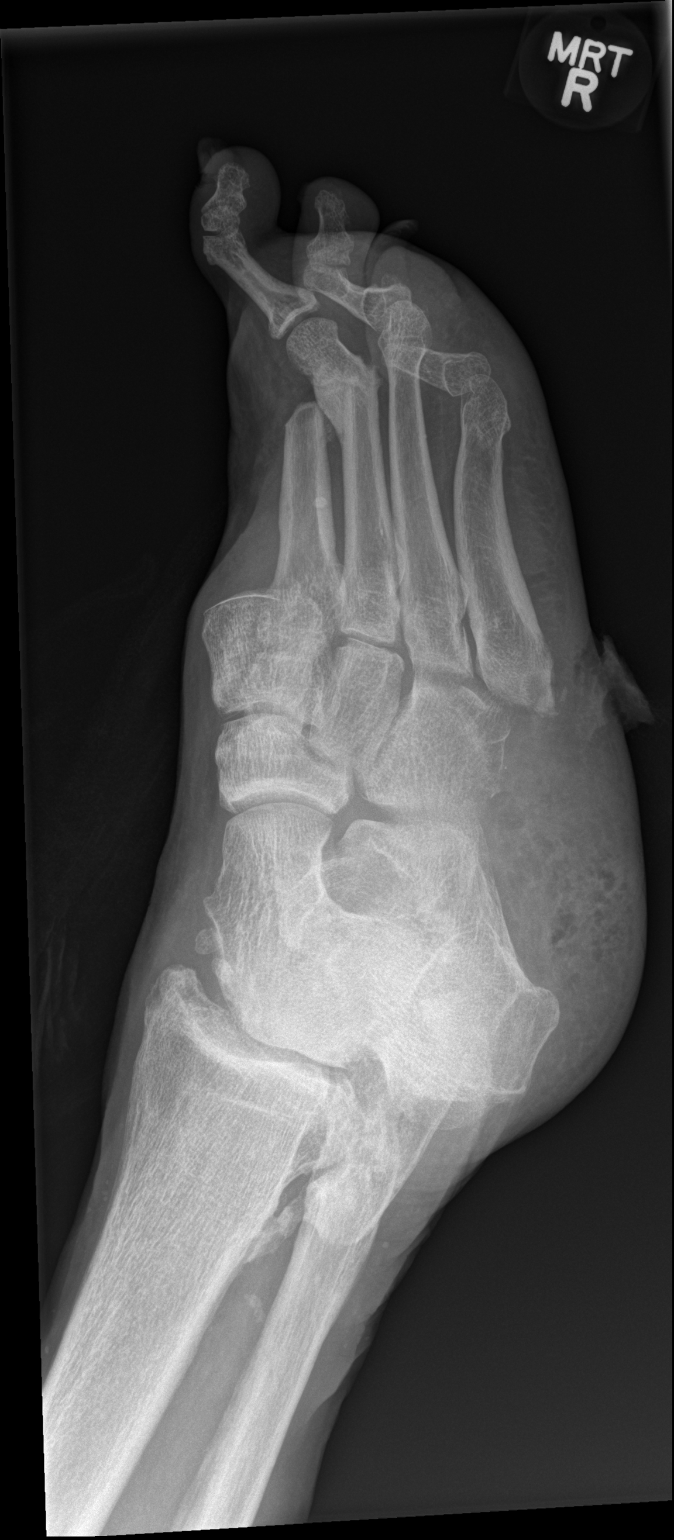

[x foot lat right]
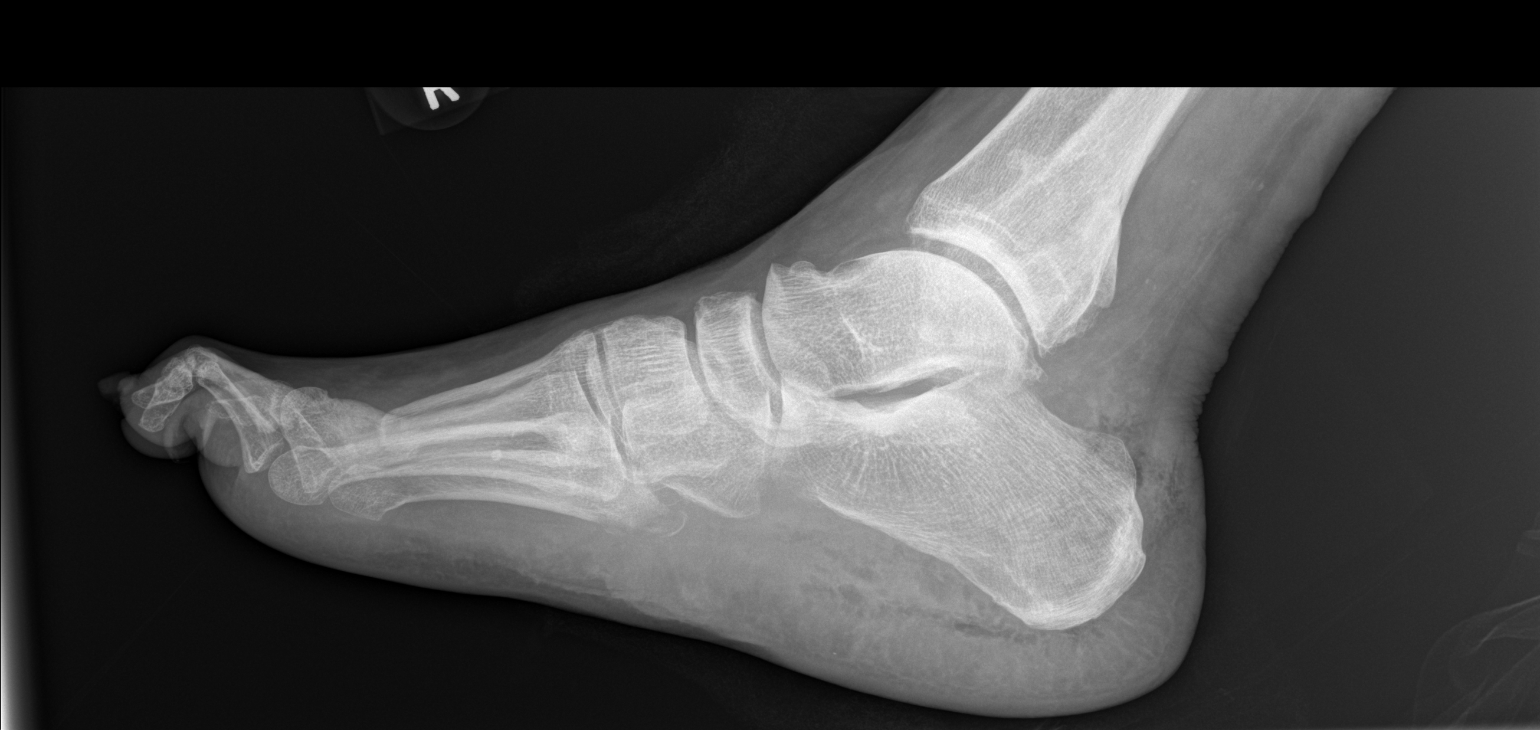

[3 of 3 positions shown; findings below may reference images not displayed]

FINDINGS: Diffuse hindfoot soft tissue swelling and air in the soft tissues
worrisome for infection (gas gangrene). There is an open wound on
the lateral aspect of the foot near the base of the fifth
metatarsal. Findings suspicious for osteomyelitis.
IMPRESSION: Diffuse hindfoot swelling and gas in the soft tissues worrisome for
infection (gas gangrene).

Open wound near the base of the fifth metatarsal with underlying
osteomyelitis.

## 2015-04-22 NOTE — Progress Notes (Signed)
54 y.o. male with history of HTN, PAF- on xarelto, polysubstance abuse--UDS positive for cocaine, DM type 2 with peripheral neuropathy, PAD with B-BKA who was admitted on 04/14/15 with 3 week history of severe pain and coolness of R-BKA site. 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 He had no relief with conservative care. Dr. Sharol Given recommended revision to BKA. Patient underwent R-AKA on 11/03 and Gabapentin increased to help manage neuropathic pain  Subjective/Complaints: Slept better with less pain last noc, c/o stump pain this am ROS:   Pt without SOB, CP,no N/V/D Objective: Vital Signs: Blood pressure 141/81, pulse 91, temperature 98.3 F (36.8 C), temperature source Oral, resp. rate 17, height '6\' 4"'  (1.93 m), weight 91.581 kg (201 lb 14.4 oz), SpO2 93 %. No results found. Results for orders placed or performed during the hospital encounter of 04/19/15 (from the past 72 hour(s))  Glucose, capillary     Status: Abnormal   Collection Time: 04/19/15  9:20 PM  Result Value Ref Range   Glucose-Capillary 177 (H) 65 - 99 mg/dL  Urinalysis, Routine w reflex microscopic (not at [facility])     Status: Abnormal   Collection Time: 04/20/15 12:10 AM  Result Value Ref Range   Color, Urine YELLOW YELLOW   APPearance CLEAR CLEAR   Specific Gravity, Urine 1.016 1.005 - 1.030   pH 7.0 5.0 - 8.0   Glucose, UA 250 (A) NEGATIVE mg/dL   Hgb urine dipstick TRACE (A) NEGATIVE   Bilirubin Urine NEGATIVE NEGATIVE   Ketones, ur NEGATIVE NEGATIVE mg/dL   Protein, ur 100 (A) NEGATIVE mg/dL   Urobilinogen, UA 1.0 0.0 - 1.0 mg/dL   Nitrite NEGATIVE NEGATIVE   Leukocytes, UA NEGATIVE NEGATIVE  Urine microscopic-add on     Status: None   Collection Time: 04/20/15 12:10 AM  Result Value Ref Range   Squamous Epithelial / LPF RARE RARE   WBC, UA 0-2 <3 WBC/hpf   RBC / HPF 3-6 <3 RBC/hpf   Bacteria, UA RARE RARE  Urine culture     Status: None    Collection Time: 04/20/15 12:11 AM  Result Value Ref Range   Specimen Description URINE, CLEAN CATCH    Special Requests NONE    Culture MULTIPLE SPECIES PRESENT, SUGGEST RECOLLECTION    Report Status 04/21/2015 FINAL   CBC WITH DIFFERENTIAL     Status: Abnormal   Collection Time: 04/20/15  3:23 AM  Result Value Ref Range   WBC 8.0 4.0 - 10.5 K/uL   RBC 4.34 4.22 - 5.81 MIL/uL   Hemoglobin 12.4 (L) 13.0 - 17.0 g/dL   HCT 37.9 (L) 39.0 - 52.0 %   MCV 87.3 78.0 - 100.0 fL   MCH 28.6 26.0 - 34.0 pg   MCHC 32.7 30.0 - 36.0 g/dL   RDW 14.9 11.5 - 15.5 %   Platelets 357 150 - 400 K/uL   Neutrophils Relative % 63 %   Neutro Abs 5.0 1.7 - 7.7 K/uL   Lymphocytes Relative 28 %   Lymphs Abs 2.3 0.7 - 4.0 K/uL   Monocytes Relative 7 %   Monocytes Absolute 0.6 0.1 - 1.0 K/uL   Eosinophils Relative 2 %   Eosinophils Absolute 0.2 0.0 - 0.7 K/uL   Basophils Relative 0 %   Basophils Absolute 0.0 0.0 - 0.1 K/uL  Comprehensive metabolic panel     Status: Abnormal   Collection Time: 04/20/15  3:23 AM  Result Value Ref Range   Sodium 137 135 - 145 mmol/L   Potassium 4.0 3.5 - 5.1 mmol/L   Chloride 99 (L) 101 - 111 mmol/L   CO2 30 22 - 32 mmol/L   Glucose, Bld 125 (H) 65 - 99 mg/dL   BUN 12 6 - 20 mg/dL   Creatinine, Ser 0.87 0.61 - 1.24 mg/dL   Calcium 9.2 8.9 - 10.3 mg/dL   Total Protein 6.5 6.5 - 8.1 g/dL   Albumin 2.0 (L) 3.5 - 5.0 g/dL   AST 31 15 - 41 U/L   ALT 29 17 - 63 U/L   Alkaline Phosphatase 70 38 - 126 U/L   Total Bilirubin 0.4 0.3 - 1.2 mg/dL   GFR calc non Af Amer >60 >60 mL/min   GFR calc Af Amer >60 >60 mL/min    Comment: (NOTE) The eGFR has been calculated using the CKD EPI equation. This calculation has not been validated in all clinical situations. eGFR's persistently <60 mL/min signify possible Chronic Kidney Disease.    Anion gap 8 5 - 15  Glucose, capillary     Status: Abnormal   Collection Time: 04/20/15  6:47 AM  Result Value Ref Range    Glucose-Capillary 104 (H) 65 - 99 mg/dL  Glucose, capillary     Status: Abnormal   Collection Time: 04/20/15 11:26 AM  Result Value Ref Range   Glucose-Capillary 280 (H) 65 - 99 mg/dL  Glucose, capillary     Status: Abnormal   Collection Time: 04/20/15  4:22 PM  Result Value Ref Range   Glucose-Capillary 155 (H) 65 - 99 mg/dL  Glucose, capillary     Status: Abnormal   Collection Time: 04/20/15  9:09 PM  Result Value Ref Range   Glucose-Capillary 180 (H) 65 - 99 mg/dL  Glucose, capillary     Status: Abnormal   Collection Time: 04/21/15  6:46 AM  Result Value Ref Range   Glucose-Capillary 196 (H) 65 - 99 mg/dL  Glucose, capillary     Status: Abnormal   Collection Time: 04/21/15 11:30 AM  Result Value Ref Range   Glucose-Capillary 175 (H) 65 - 99 mg/dL  Glucose, capillary     Status: Abnormal   Collection Time: 04/21/15  4:46 PM  Result Value Ref Range   Glucose-Capillary 143 (H) 65 - 99 mg/dL  Glucose, capillary     Status: Abnormal   Collection Time: 04/21/15  8:59 PM  Result Value Ref Range   Glucose-Capillary 236 (H) 65 - 99 mg/dL  Glucose, capillary     Status: Abnormal   Collection Time: 04/22/15  6:42 AM  Result Value Ref Range   Glucose-Capillary 237 (H) 65 - 99 mg/dL     HEENT: normal Cardio: RRR and no murmurs Resp: CTA B/L and unlabored GI: BS positive and nontender nondistended Extremity:  Pulses positive and No Edema Skin:   Wound dried blood under dressing, mild maceration around incision midpoint. Staples intact Neuro: Alert/Oriented, Agitated and Abnormal Motor Normal motor in BUE and decreased R HF due to pain, Left HF 4/5 Musc/Skel:  Extremity tender Right stump, left stump non tender Gen NAD   Assessment/Plan: 1. 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 Physiatrist is providing close team supervision and 24 hour management of active medical problems  listed below. Physiatrist and rehab team continue to assess barriers to discharge/monitor patient progress toward functional and medical goals.  FIM: Function - Bathing Bathing activity did not occur:  (nursing assisted last night) Position: Wheelchair/chair at sink (UB only completed at sink) Body parts bathed by patient: Right arm, Left arm, Chest, Abdomen Assist Level: More than reasonable time  Function- Upper Body Dressing/Undressing What is the patient wearing?: Pull over shirt/dress Pull over shirt/dress - Perfomed by patient: Thread/unthread right sleeve, Thread/unthread left sleeve, Put head through opening, Pull shirt over trunk Assist Level: Set up Set up : To obtain clothing/put away Function - Lower Body Dressing/Undressing What is the patient wearing?: Pants Position: Bed Underwear - Performed by patient: Thread/unthread right underwear leg, Thread/unthread left underwear leg Pants- Performed by patient: Thread/unthread right pants leg, Thread/unthread left pants leg, Pull pants up/down Pants- Performed by helper: Pull pants up/down Assist for footwear: Supervision/touching assist Assist for lower body dressing: Supervision or verbal cues Set up : To obtain clothing/put away     Function - Toilet Transfers Toilet transfer assistive device: Drop arm commode Assist level to toilet: Touching or steadying assistance (Pt > 75%) Assist level from toilet: Touching or steadying assistance (Pt > 75%)  Function - Chair/bed transfer Chair/bed transfer method: Lateral scoot Chair/bed transfer assist level: Supervision or verbal cues Chair/bed transfer assistive device: Armrests Chair/bed transfer details: Verbal cues for sequencing, Verbal cues for technique, Verbal cues for precautions/safety  Function - Locomotion: Wheelchair Will patient use wheelchair at discharge?: Yes Type: Motorized (motorized at home, manual in rehab) Max wheelchair distance: 150 Assist Level:  Supervision or verbal cues Assist Level: Supervision or verbal cues Assist Level: Supervision or verbal cues Turns around,maneuvers to table,bed, and toilet,negotiates 3% grade,maneuvers on rugs and over doorsills: No Function - Locomotion: Ambulation Ambulation activity did not occur: Safety/medical concerns (bilateral amputee) Walk 10 feet activity did not occur: Safety/medical concerns Walk 50 feet with 2 turns activity did not occur: Safety/medical concerns Walk 150 feet activity did not occur: Safety/medical concerns Walk 10 feet on uneven surfaces activity did not occur: Safety/medical concerns  Function - Comprehension Comprehension: Auditory Comprehension assist level: Understands complex 90% of the time/cues 10% of the time  Function - Expression Expression: Verbal Expression assist level: Expresses complex 90% of the time/cues < 10% of the time  Function - Social Interaction Social Interaction assist level: Interacts appropriately with others with medication or extra time (anti-anxiety, antidepressant).  Function - Problem Solving Problem solving assist level: Solves complex 90% of the time/cues < 10% of the time  Function - Memory Memory assist level: More than reasonable amount of time Patient normally able to recall (first 3 days only): Current season, Location of own room, Staff names and faces, That he or she is in a hospital  Medical Problem List and Plan: 1. Functional deficits secondary to Revision of R-BKA to AKA, Prior left BKA, Team conference today 2.  DVT Prophylaxis/Anticoagulation: Pharmaceutical: Xarelto 3. Pain Management:  Continue Oxycodone prn.   4. Mood: LCSW to follow for evaluation and support.   5. Neuropsych: This patient is capable of making decisions on his own behalf. 6. Skin/Wound Care: Routine pressure relief measures. Maintain adequate nutrition and hydration status. Add protein supplement due to low protein stores.   7.  Fluids/Electrolytes/Nutrition: Monitor I/O. Check lytes in am.   8. DM type 2: Poorly controlled due to non-compliance with lantus. Discussed importance of medication compliance to help with wound healing. Will monitor BS ac/hs. Continue Lantus daily with meal coverage.  increase lantus 9. HTN: Monitor BP bid--poorly controlled at this time and could  be pain mediated. Continue Cardizem and lisinopril daily. Titrate medications as indicated.   10. PAF: Monitor HR bid and for any symptoms with activity. On cardizem CD daily No BB , rate controlled at 91bpm,  May have been due to cocaine use. 11. ABLA: Add iron supplement. hgb mildly reduced 12.4  12. Constipation: Increase miralax to bid.   13. Hypokalemia: resolved   LOS (Days) 3 A FACE TO FACE EVALUATION WAS PERFORMED  KIRSTEINS,ANDREW E 04/22/2015, 8:28 AM

## 2015-04-22 NOTE — Progress Notes (Addendum)
Physical Therapy Session Note  Patient Details  Name: Steven [name] MRN: [id number] Date of Birth: [birth year]  Today's Date: 04/22/2015 PT Individual Time:Session 1: [id number]; Session 2: 1400-1510 PT Individual Time Calculation (min):Session 1: 45 min; Session 2: 70 min   Short Term Goals: Week 1:  PT Short Term Goal 1 (Week 1): = LTG due to short LOS  Skilled Therapeutic Interventions/Progress Updates:   Session 1: Patient seen with sister and initially social worker present.  Worked on discharge planning with sister discussing most appropriate vehicle for d/c home as well as d/c needs.  Patient up in wheelchair propelled mod I >150'.  Sister present for caregiver education with car transfer and pt performed slide board transfer with supervision mod cues and assist for board placement.  Patient c/o pain right LE with transfer.  Sister present and verbalized understanding of technique.  Patient has power chair at home and will have assist to get chair out to car for transfer once home.  Patient transferred to bed via slide board transfer and supervision A again for board placement.  Patient in supine for assist with rewrapping R AKA.  Patient left in supine with sister present and leg elevated for comfort and edema control all needs in reach.  Session 2: Patient in bed with left prosthesis already donned.  Reported prosthetist came and was able to don.  Patient supine to sit with elevated HOB supervision increased time due to R LE pain.  Transfer to w/c scoot pivot supervision.  Propels mod I to gym.  Sit to stand in parallel bars min A and ambulated 10' min A.  Patient stood second time for standing R LE hip extension x 20, then abduction x 20, then extension again x 10.  Seated in w/c UBE x 6 min A level 5.  HR 103, SpO2 100%, BP 141/74.  Patient assist for written HEP formulation and reviewed with pt and for practice in session next day.  Patient transferred back to bed with prosthesis via  scoot pivot with A for R LE management due to pain.  Assist for doffing left prosthesis and don shrinker.  Left with all needs in reach.  Therapy Documentation Precautions:  Precautions Precautions: Fall Precaution Comments: prostheses are at the hospital, but not the inserts Restrictions Weight Bearing Restrictions: Yes RLE Weight Bearing: Non weight bearing Pain: Pain Assessment Pain Assessment: 0-10 Pain Score: 8  Pain Type: Surgical pain Pain Location: Leg Pain Orientation: Right Pain Intervention(s): Repositioned;Elevated extremity   See Function Navigator for Current Functional Status.   Therapy/Group: Individual Therapy  [place], [place] 04/22/2015  04/22/2015, 4:32 PM

## 2015-04-22 NOTE — IPOC Note (Signed)
Overall Plan of Care [facility]) Patient Details Name: Steven [name] MRN: [id number] DOB: January 06, 1961  Admitting Diagnosis: R AKA   old L BKA  Hospital Problems: Active Problems:   Diabetes mellitus type II, uncontrolled ([place])   Unilateral complete AKA ([place])   History of left below knee amputation ([place])     Functional Problem List: Nursing Bowel, Endurance, Medication Management, Motor, Nutrition, Pain, Skin Integrity  PT Balance, Endurance, Motor, Pain, Sensory  OT Balance, Edema, Endurance, Motor, Pain, Safety, Skin Integrity  SLP    TR         Basic ADL's: OT Grooming, Bathing, Dressing, Toileting     Advanced  ADL's: OT       Transfers: PT Bed Mobility, Bed to Chair, Car, Manufacturing systems engineer, Metallurgist: PT Wheelchair Mobility     Additional Impairments: OT Fuctional Use of Upper Extremity  SLP        TR      Anticipated Outcomes Item Anticipated Outcome  Self Feeding n/a  Swallowing      Basic self-care  mod I   Toileting  mod I    Bathroom Transfers mod I   Bowel/Bladder  Mod I  Transfers  Mod I  Locomotion  Mod I w/c level  Communication     Cognition     Pain  < 5  Safety/Judgment  Mod I   Therapy Plan: PT Intensity: Minimum of 1-2 x/day ,45 to 90 minutes PT Frequency: 5 out of 7 days PT Duration Estimated Length of Stay: 5-7 days OT Intensity: Minimum of 1-2 x/day, 45 to 90 minutes OT Frequency: 5 out of 7 days OT Duration/Estimated Length of Stay: 5-7 days         Team Interventions: Nursing Interventions Patient/Family Education, Bowel Management, Disease Management/Prevention, Pain Management, Medication Management, Skin Care/Wound Management, Discharge Planning  PT interventions Balance/vestibular training, Discharge planning, Disease management/prevention, DME/adaptive equipment instruction, Functional mobility training, Neuromuscular re-education, Pain management, Patient/family education, Psychosocial support,  Skin care/wound management, Therapeutic Activities, Therapeutic Exercise, UE/LE Strength taining/ROM, Wheelchair propulsion/positioning  OT Interventions Balance/vestibular training, Cognitive remediation/compensation, Community reintegration, Discharge planning, Pain management, Skin care/wound managment, Functional mobility training, Self Care/advanced ADL retraining, UE/LE Strength taining/ROM, Therapeutic Exercise, Psychosocial support, Patient/family education, Wheelchair propulsion/positioning, Therapeutic Activities, DME/adaptive equipment instruction, UE/LE Coordination activities  SLP Interventions    TR Interventions    SW/CM Interventions Discharge Planning, Psychosocial Support, Patient/Family Education    Team Discharge Planning: Destination: PT-Home ,OT- Home , SLP-  Projected Follow-up: PT-Home health PT, OT-  Home health OT, SLP-  Projected Equipment Needs: PT-None recommended by PT, OT- To be determined, SLP-  Equipment Details: PT-Pt owns power w/c, RW, OT-  Patient/family involved in discharge planning: PT- Patient,  OT-Patient, SLP-   MD ELOS: 7-10d Medical Rehab Prognosis:  Good Assessment: 54 y.o. male with history of HTN, PAF- on xarelto, polysubstance abuse--UDS positive for cocaine, DM type 2 with peripheral neuropathy, PAD with B-BKA who was admitted on 04/14/15 with 3 week history of severe pain and coolness of R-BKA site. 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 He had no relief with conservative care. Dr. Sharol Given recommended revision to BKA. Patient underwent R-AKA on 11/03 and Gabapentin increased to help manage neuropathic pain   Now requiring 24/7 Rehab RN,MD, as well as CIR level PT, OT .  Treatment team will focus on ADLs and mobility with goals  set at Mod I  See Team Conference Notes for weekly updates to the plan of care

## 2015-04-22 NOTE — Patient Instructions (Signed)
Hip Extension    With towel roll under residual limb, push down into towel roll while lifting buttocks. Hold ___5_ seconds. Repeat __20__ times. Do _2___ sessions per day.  Copyright  VHI. All rights reserved.  Hip Side Kick    Holding a chair for balance, keep legs shoulder width apart and toes pointed forward. Swing a leg out to side, keeping knee straight. Do not lean. Repeat using other leg. Repeat __20__ times. Do _2___ sessions per day.  http://gt2.exer.us/342   Copyright  VHI. All rights reserved.  Sitting Chair Flexion    Bring knee up toward chest. Repeat with other leg. Repeat __20__ times. Do __2__ sessions per day.  http://gt2.exer.us/391   Copyright  VHI. All rights reserved.  Strengthening: Hip Abduction (Side-Lying)    Tighten muscles on front of left thigh, then lift leg __8__ inches from surface, keeping knee locked.  Repeat __20__ times per set. Do __1__ sets per session. Do ___2_ sessions per day.  http://orth.exer.us/622   Copyright  VHI. All rights reserved.  Strengthening: Straight Leg Raise (Phase 1)    Tighten muscles on front of right thigh, then lift leg __6__ inches from surface, keeping knee locked.  Repeat __20__ times per set. Do _1___ sets per session. Do _2__ sessions per day.  http://orth.exer.us/614   Copyright  VHI. All rights reserved.  Hip Backward Kick    Using a chair for balance, keep legs shoulder width apart and toes pointed for- ward. Slowly extend one leg back, keeping knee straight. Do not lean forward. Repeat with other leg. Repeat __20__ times. Do _2___ sessions per day.  http://gt2.exer.us/340   Copyright  VHI. All rights reserved.  ADDUCTION: Isometric    With ball between knees, squeeze them inward. Hold _5__ seconds. Complete __1_ sets of _20__ repetitions. Perform _2_ sessions per day.  http://gtsc.exer.us/124   Copyright  VHI. All rights reserved.

## 2015-04-22 NOTE — Progress Notes (Signed)
Social Work Patient ID: Steven [name], male   DOB: [birth year], 54 y.o.   MRN: [id number]   CSW met with pt to update him on team conference discussion.  Pt was pleased to be going home next week, so he hopes that his pain is better controlled by then.  Met with pt's sister, [name], today while she was visiting and we discussed how pt would get home on Tuesday, as she has a high [place]ian[place] Lei and thinks it would be better for him to get in and out of a car.  Sister will call his friend, Kenney Houseman, to see if she can come to get pt.  CSW to schedule family education for car transfers once sister and friend decide when they are coming to pick up pt.  Pt will also practice on "car" in rehab gym today while sister is present.  Pt will NOT have f/u therapies due to not meeting Medicaid criteria for home therapies, so CSW has asked team to send him home with home therapy exercises/program.  Pt will have [facility] RN from [place] to check amputation site and [facility] will follow pt for community case management.  CSW also called [place] to ask them to go to pt's house and look at his electric w/c because he stated that the seat is broken and needs to be replaced.  They will call pt after he gets home.  CSW will also refer pt for PCS closer to d/c, as well.  CSW continues to follow and will assist as needed.

## 2015-04-22 NOTE — Patient Care Conference (Addendum)
Inpatient RehabilitationTeam Conference and Plan of Care Update Date: 04/21/2015   Time: 2:05 PM    Patient Name: Steven [name]      Medical Record Number: [id number]  Date of Birth: 12-01-1960 Sex: Male         Room/Bed: 4W01C/4W01C-01 Payor Info: Payor: MEDICAID [place] / Plan: MEDICAID [place] ACCESS / Product Type: *No Product type* /    Admitting Diagnosis: R AKA   old L BKA  Admit Date/Time:  04/19/2015  6:09 PM Admission Comments: No comment available   Primary Diagnosis:  <principal problem not specified> Principal Problem: <principal problem not specified>  Patient Active Problem List   Diagnosis Date Noted  . History of left below knee amputation ([place]) 04/22/2015  . Unilateral complete AKA ([place]) 04/19/2015  . Dry gangrene ([place])   . Right leg pain 04/14/2015  . Cough 08/12/2014  . Bilateral hand pain 05/29/2014  . Acne 03/30/2014  . S/P bilateral BKA (below knee amputation) ([place]) 01/22/2014  . Cocaine abuse 12/05/2013  . Tobacco abuse 12/04/2013  . Peripheral vascular disease ([place])   . Atrial fibrillation ([place]) 12/03/2013  . 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 History of colonic polyps 05/29/2013  . Diabetic retinopathy ([place]) 12/25/2012  . HLD (hyperlipidemia) 12/08/2012  . Peripheral neuropathy ([place]) 11/19/2011  . ETOH abuse 03/29/2011  . HTN (hypertension) 12/22/2010  . Diabetes mellitus type II, uncontrolled ([place]) 07/18/2006  . VENOUS INSUFFICIENCY 07/18/2006    Expected Discharge Date: Expected Discharge Date: 04/27/15  Team Members Present: Physician leading conference: Dr. Delice Lesch Social Worker Present: Alfonse Alpers, LCSW Nurse Present: Heather Roberts, RN PT Present: Raylene Everts, PT OT Present: Benay Pillow, OT SLP Present: Windell Moulding, SLP PPS Coordinator present : Daiva Nakayama, RN, CRRN     Current Status/Progress Goal Weekly Team Focus  Medical   Diabetes with peripheral neuropathy, history of bilateral BKA, right AKA for gangrene  Home at modified  independent level  Optimize pain medication management   Bowel/Bladder   Continent to bowel and bladder.  To continue continent to B & B with min. assisst.  To monitor bowel and bladder function Q shift.Assess Q shift for S & S of constipation.   Swallow/Nutrition/ Hydration             ADL's   Overall Min A for BADL and transfers  Overall Mod I for BADL/iADL  Safety awareness, transfers, pain management, DME re-training   Mobility   Supervision-min A overall transfers and w/c mobility  Mod I overall  pain management, strengthening, balance, transfers   Communication             Safety/Cognition/ Behavioral Observations            Pain         To monitor pain level Q 2-3 hrs. Assess for responds to activity and medicated pt. as needed.   Skin   Post surgical dressing on right stump.  To keep skin free from infections and sores.  To monitor skin Q shift and PRN.    Rehab Goals Patient on target to meet rehab goals: Yes Rehab Goals Revised: none *See Care Plan and progress notes for long and short-term goals.  Barriers to Discharge: Pain control, cooperation    Possible Resolutions to Barriers:  See above, continue rehabilitation efforts    Discharge Planning/Teaching Needs:  Pt to return to his home at d/c.   Therapists would like for pt's sister to come in for family education in re: to  car transfers.  CSW to set this up.   Team Discussion:  Pt's goals are for mod I w/c level.  Pt is at supervision to min assist now and team anticipates pt reaching mod I by 04-27-15.  CSW to arrange for pt's sister to com in for family education to practice car transfers.  Revisions to Treatment Plan:  none   Continued Need for Acute Rehabilitation Level of Care: The patient requires daily medical management by a physician with specialized training in physical medicine and rehabilitation for the following conditions: Daily direction of a multidisciplinary physical rehabilitation program  to ensure safe treatment while eliciting the highest outcome that is of practical value to the patient.: Yes Daily medical management of patient stability for increased activity during participation in an intensive rehabilitation regime.: Yes Daily analysis of laboratory values and/or radiology reports with any subsequent need for medication adjustment of medical intervention for : Neurological problems;Other;Post surgical problems  Kevina Piloto, Silvestre Mesi 04/22/2015, 9:38 AM

## 2015-04-22 NOTE — Progress Notes (Signed)
Pt a/o, c/o pain, PRN and scheduled pain meds given as ordered, pt requesting sleeping pill, PRN trazodone given as ordered, VSS, pt stable

## 2015-04-22 NOTE — Progress Notes (Signed)
Occupational Therapy Session Note  Patient Details  Name: Steven [name] MRN: [id number] Date of Birth: 15-Mar-1961  Today's Date: 04/22/2015 OT Individual Time: 0730-0900 OT Individual Time Calculation (min): 90 min    Short Term Goals: Week 1:  OT Short Term Goal 1 (Week 1): LTG=STG  Skilled Therapeutic Interventions/Progress Updates: ADL-retraining at tub room, with focus on anterior/posterior transfer to tub bench in standard tub, adapted bathing/dressing skills, and w/c management/mobility.   Pt received supine in bed awating threapist.   After re-ed on goals of treatment, pt completes posterior transfer to w/c and received MD and RN in room to review progress an dispense meds.   Pt demonstrates appropriate social interaction and responds to questions from providers w/o incident or signs of confusion.   Pt then propels w/c to ADL apartment with min instructional cues and setup assist.  Pt requires extra time to complete anterior transfer to tub bench after min assist to remove brief at w/c level prior to transfer d/t declining recommendation to remove brief while in bed.   Pt requires extra time to bathe but completes task unassisted and recovers to w/c to dress.  Pt requires min assist to pull up pants after lifting his buttocks from seat pan.   OT educates pt on performing bed level dressing at home, rolling right to left, although pt states he now only possesses a couch.   Pt declines recommendation to use prosthetic leg to perform sit<>stand d/t increased RLE when in dependent position during this session.   Pt left in w/c at end of session near bedside awaiting RN to place nicotine patch.     Therapy Documentation Precautions:  Precautions Precautions: Fall Precaution Comments: prostheses are at the hospital, but not the inserts Restrictions Weight Bearing Restrictions: Yes RLE Weight Bearing: Non weight bearing  Vital Signs: Therapy Vitals Temp: 98.3 F (36.8 C) Temp Source:  Oral Pulse Rate: 91 Resp: 17 BP: (!) 141/81 mmHg Patient Position (if appropriate): Lying Oxygen Therapy SpO2: 93 % O2 Device: Not Delivered   Pain: Pain Assessment Pain Assessment: 0-10 Pain Score: 9  Faces Pain Scale: Hurts whole lot Pain Type: Surgical pain Pain Location: Leg Pain Orientation: Right Pain Intervention(s): Medication (See eMAR);Shower  ADL: ADL ADL Comments: see function navigator  See Function Navigator for Current Functional Status.   Therapy/Group: Individual Therapy  Catherina Pates 04/22/2015, 8:59 AM

## 2015-04-23 ENCOUNTER — Inpatient Hospital Stay (HOSPITAL_COMMUNITY): Payer: Medicaid Other | Admitting: Physical Therapy

## 2015-04-23 ENCOUNTER — Encounter (HOSPITAL_COMMUNITY): Payer: Self-pay | Admitting: Psychology

## 2015-04-23 ENCOUNTER — Inpatient Hospital Stay (HOSPITAL_COMMUNITY): Payer: Medicaid Other

## 2015-04-23 DIAGNOSIS — F4321 Adjustment disorder with depressed mood: Secondary | ICD-10-CM | POA: Diagnosis present

## 2015-04-23 LAB — GLUCOSE, CAPILLARY
GLUCOSE-CAPILLARY: 165 mg/dL — AB (ref 65–99)
Glucose-Capillary: 111 mg/dL — ABNORMAL HIGH (ref 65–99)
Glucose-Capillary: 99 mg/dL (ref 65–99)
Glucose-Capillary: 170 mg/dL — ABNORMAL HIGH (ref 65–99)

## 2015-04-23 NOTE — Progress Notes (Signed)
Physical Therapy Session Note  Patient Details  Name: Steven [name] MRN: [id number] Date of Birth: 25-Aug-1960  Today's Date: 04/23/2015 PT Individual Time: 1000-1100 PT Individual Time Calculation (min): 60 min   Short Term Goals: Week 1:  PT Short Term Goal 1 (Week 1): = LTG due to short LOS  Skilled Therapeutic Interventions/Progress Updates:    Pt received seated in bed with c/o pain as described below in RLE residual limb; otherwise agreeable to treatment. Supine>sit with bedrails and HOB elevated, supervision. Transfer bed> w/c with minA to stabilize LLE prosthetic on floor to prevent sliding. W/c propulsion with BUEs x175' with mod I. Lateral scoot w/c <>mat table with minA again for prevention of LLE prosthetic from sliding. Seated and supine LE strengthening exercises. Pt given handouts from previous sessions and cued to perform as if performing at home to assess independence with HEP. Required min cues for sequencing of exercises for efficiency, minA for technique on sidelying hip abduction. Educated pt on flat supine lying or prone lying for hip flexor stretch; declines trying prone at this time due to pain however understands importance and will try later and flat supine throughout the day. Lateral scoot to return to w/c with minA for stabilizing LLE prosthetic. Discussed with pt possibility of getting new shoes with better traction to allow for increased safety and independence; pt agrees shoes are "worn out" and will look into getting new shoes. Returned to room in w/c with mod I. Remained seated in w/c with all needs in reach, pt given towel per request to put under RLE residual for limb elevation.   Therapy Documentation Precautions:  Precautions Precautions: Fall Precaution Comments: prostheses are at the hospital, but not the inserts Restrictions Weight Bearing Restrictions: Yes RLE Weight Bearing: Non weight bearing Pain: Pain Assessment Pain Assessment: 0-10 Pain Score:  9  Faces Pain Scale: Hurts even more Pain Type: Surgical pain Pain Location: Leg Pain Orientation: Right Pain Descriptors / Indicators: Aching;Constant Pain Onset: On-going Patients Stated Pain Goal: 3 Pain Intervention(s): Other (Comment) (pre-medicated)   See Function Navigator for Current Functional Status.   Therapy/Group: Individual Therapy  Luberta Mutter 04/23/2015, 10:48 AM

## 2015-04-23 NOTE — Consult Note (Signed)
INITIAL PSYCHODIAGNOSTIC EXAMINATION - [place]   Mr. Steven [name] is a 54 year old man, with history of bilateral below knee amputation, who was seen for an initial psychodiagnostic examination to evaluate his emotional state in the setting of right above knee amputation.  According to staff members on the rehabilitation unit, he exhibited possible signs of depression as well as lack of insight into his physical limitations at times and therefore, a neuropsychological evaluation was requested.    During the clinical interview, Mr. Steven [name] complained repeatedly of pain and remarked, "I hate pain."  He denied phantom limb pain but stated that his right thigh was extremely painful and was causing his mood to be "evil."  He rubbed it throughout the session and said later that rubbing the site of the pain was helpful.  Mr. Steven [name] noted that after experiencing intense pain, he "sees spots."  Despite his report of being frustrated by pain, he denied depressive symptoms, including suicidal ideation; he also denied trouble sleeping or changes in appetite.  He remarked that he continues to be motivated to participate in therapy sessions and said that he knows he will "make it."  He was not interested in learning alternative techniques for pain management (e.g. deep breathing), but asked the neuropsychologist if she could recommend that he receive additional pain medications.  Mr. Steven [name] reported feeling ready to return home, but remarked that he just had to learn how to make accommodations for his new physical limitations.    IMPRESSIONS AND RECOMMENDATIONS:  Mr. Steven [name] endorsed irritable mood associated with experiencing pain, but did not report other symptoms concerning for major depression.  As such, he is likely presenting with an adjustment disorder with depressed mood.  Mr. Steven [name] was not interested in pursuing non-medication related pain relief techniques at this time,  but if he changes his mind in the future, he could consider meeting with a psychotherapist in order to learn such techniques.  In the meantime, staff should be aware that his irritable mood is likely stemming from pain and not depression.  His doctor could consider whether he is eligible for increased pain medication and adjust as appropriate.  Although Mr. Steven [name] did not disclose what his particular physical limitations were, he noted that he needed to make physical accommodations once returning home and therefore, he likely has some level of insight.  Staff members may find it helpful to write down specific recommendations at discharge regarding what he should and should not attempt to do on his own.  Should Mr. Steven [name] mood dramatically decline prior to discharge, a follow-up session with a neuropsychologist could be requested.    DIAGNOSIS:   Adjustment disorder with depressed mood  Marlane Hatcher, Psy.D.  Clinical Neuropsychologist

## 2015-04-23 NOTE — Plan of Care (Signed)
Problem: RH PAIN MANAGEMENT Goal: RH STG PAIN MANAGED AT OR BELOW PT'S PAIN GOAL Pain level at or below 4  Outcome: Not Progressing Patient reports pain level not less than 5 at anytime

## 2015-04-23 NOTE — Progress Notes (Signed)
Occupational Therapy Session Note  Patient Details  Name: SHAHRUKH HORROCKS MRN: [id number] Date of Birth: 1961-02-20  Today's Date: 04/23/2015 OT Individual Time: [id number] OT Individual Time Calculation (min): 60 min    Short Term Goals: Week 1:  OT Short Term Goal 1 (Week 1): LTG=STG  Skilled Therapeutic Interventions/Progress Updates: ADL-retraining at shower level with focus on improved efficiency with transfers, problem-solving, and improved awareness.    Pt received supine in bed, HOB elevated and awaiting breakfast.  Pt is presented meal tray and completes self-feeding independently with extra time, despite numerous cues to expedite in order to receive assist with bathing/dressing skills.    Pt elects to don LLE prosthesis to perform transfer from bed to w/c but is unable to tolerate full standing d/t RLE pain.   Pt returns to squat pivot transfer to w/c and modified AP transfer from w/c to tub bench d/t use of prosthesis.   Pt bathes unassisted but is unable to complete task to within time limits cued to allow time to dress.   Pt recovers to w/c from tub bench without prosthesis and with extra time to manage pain but requires only steadying assist to hold w/c.   Pt presented with gown at end of session to await RN tech assist for dressing this session.   Pt educated on need for improved time management when cued to advance through session.        Therapy Documentation Precautions:  Precautions Precautions: Fall Precaution Comments: prostheses are at the hospital, but not the inserts Restrictions Weight Bearing Restrictions: Yes RLE Weight Bearing: Non weight bearing  Vital Signs: Therapy Vitals BP: 140/70 mmHg   Pain: Pain Assessment Pain Assessment: 0-10 Pain Score: 9  Faces Pain Scale: Hurts even more Pain Type: Surgical pain Pain Location: Leg Pain Orientation: Right Pain Descriptors / Indicators: Aching;Constant Pain Onset: On-going Patients Stated Pain Goal: 3 Pain  Intervention(s): Other (Comment) (pre-medicated)  ADL: ADL ADL Comments: see function navigator   See Function Navigator for Current Functional Status.   Therapy/Group: Individual Therapy   Second session: Time: [id number] Time Calculation (min): 65 min  Pain Assessment: 10/10, with activity (transfer to toilet)  Skilled Therapeutic Interventions: Therapeutic exercise (30 min) with focus on UE strengthening, general endurance, w/c mobility.    Pt propels w/c to gym and performs 15 min of continuous UE ergometry using Sci-Fit, random routine, level 3 resistance.   Pt fatigues at 11 min mark but perseveres with encouragement to end of session.  HR assessed at optimal range 96 bpm however diastolic bp above range (see vitals flowsheet).   Pt rests briefly and progresses to therapeutic activity (iADL skill: homemaking, simple meal prep at w/c level).   With kitchen adapted to pt's description (io place needed items on or near countertops or within reach from w/c), pt prepares 1 serving of quick grits (5 min variety) using microwave.    Pt excuses himself to toilet and requires mod assist to manage clothing and steadying assist transfer to toilet in apartment using lateral scoot.   Pt requires simliar assist to return to w/c and is escorted back to his room for RN care to replace dressing on RLE.  See FIM for current functional status  Therapy/Group: Individual Therapy  [place] 04/23/2015, 10:24 AM

## 2015-04-23 NOTE — Progress Notes (Signed)
Physical Therapy Session Note  Patient Details  Name: Steven [name] MRN: [id number] Date of Birth: 1960-06-24  Today's Date: 04/23/2015 PT Individual Time: 1135-1205 PT Individual Time Calculation (min): 30 min   Short Term Goals: Week 1:  PT Short Term Goal 1 (Week 1): = LTG due to short LOS  Skilled Therapeutic Interventions/Progress Updates:    Patient in w/c in room with R LE elevated on 2 pillows, L prosthesis donned, and RN just delivered pain medication.  Patient agreed to participate in practice stand pivot transfer with RW.  Propelled to ADL apartment about 250' mod I.  Performed sit to stand with mod A cues for hand placement due to balance.  Stand hop w/c to bed min/mod A for safety/balance with RW.  Sat on bed with lowering assist and c/o increased R LE pain.  Seated rest, followed by sit to stand from bed mod A and several hops to pivot on prosthetic limb on L with RW, but unable to back up to chair so brought chair to patient and assisted to sit.  Patient propelled back to room and assist to elevate R LE and place lunch tray in reach.  Therapy Documentation Precautions:  Precautions Precautions: Fall Precaution Comments: prostheses are at the hospital, but not the inserts Restrictions Weight Bearing Restrictions: Yes RLE Weight Bearing: Non weight bearing Pain: Pain Assessment Pain Score: 10-Worst pain ever Pain Type: Surgical pain Pain Location: Leg Pain Orientation: Right Pain Descriptors / Indicators: Aching Pain Frequency: Intermittent Pain Onset: With Activity Patients Stated Pain Goal: 4 Pain Intervention(s): Rest;Elevated extremity  See Function Navigator for Current Functional Status.   Therapy/Group: Individual Therapy  [place], [place] 04/23/2015  04/23/2015, 5:03 PM

## 2015-04-24 ENCOUNTER — Inpatient Hospital Stay (HOSPITAL_COMMUNITY): Payer: Medicaid Other | Admitting: Physical Therapy

## 2015-04-24 ENCOUNTER — Inpatient Hospital Stay (HOSPITAL_COMMUNITY): Payer: Medicaid Other | Admitting: Occupational Therapy

## 2015-04-24 DIAGNOSIS — E1100 Type 2 diabetes mellitus with hyperosmolarity without nonketotic hyperglycemic-hyperosmolar coma (NKHHC): Secondary | ICD-10-CM

## 2015-04-24 DIAGNOSIS — Z89512 Acquired absence of left leg below knee: Secondary | ICD-10-CM

## 2015-04-24 DIAGNOSIS — F4321 Adjustment disorder with depressed mood: Secondary | ICD-10-CM

## 2015-04-24 LAB — GLUCOSE, CAPILLARY
Glucose-Capillary: 193 mg/dL — ABNORMAL HIGH (ref 65–99)
Glucose-Capillary: 160 mg/dL — ABNORMAL HIGH (ref 65–99)
Glucose-Capillary: 105 mg/dL — ABNORMAL HIGH (ref 65–99)

## 2015-04-24 NOTE — Progress Notes (Signed)
Subjective/Complaints: Feels good after shower ROS:   Pt without SOB, CP,no N/V/D Objective: Vital Signs: Blood pressure 131/85, pulse 78, temperature 97.9 F (36.6 C), temperature source Oral, resp. rate 18, height 6\' 4"  (1.93 m), weight 91.581 kg (201 lb 14.4 oz), SpO2 97 %. No results found. Results for orders placed or performed during the hospital encounter of 04/19/15 (from the past 72 hour(s))  Glucose, capillary     Status: Abnormal   Collection Time: 04/21/15 11:30 AM  Result Value Ref Range   Glucose-Capillary 175 (H) 65 - 99 mg/dL  Glucose, capillary     Status: Abnormal   Collection Time: 04/21/15  4:46 PM  Result Value Ref Range   Glucose-Capillary 143 (H) 65 - 99 mg/dL  Glucose, capillary     Status: Abnormal   Collection Time: 04/21/15  8:59 PM  Result Value Ref Range   Glucose-Capillary 236 (H) 65 - 99 mg/dL  Glucose, capillary     Status: Abnormal   Collection Time: 04/22/15  6:42 AM  Result Value Ref Range   Glucose-Capillary 237 (H) 65 - 99 mg/dL  Glucose, capillary     Status: Abnormal   Collection Time: 04/22/15 11:55 AM  Result Value Ref Range   Glucose-Capillary 129 (H) 65 - 99 mg/dL  Glucose, capillary     Status: None   Collection Time: 04/22/15  5:00 PM  Result Value Ref Range   Glucose-Capillary 98 65 - 99 mg/dL  Glucose, capillary     Status: Abnormal   Collection Time: 04/22/15  9:12 PM  Result Value Ref Range   Glucose-Capillary 129 (H) 65 - 99 mg/dL  Glucose, capillary     Status: Abnormal   Collection Time: 04/23/15  6:35 AM  Result Value Ref Range   Glucose-Capillary 111 (H) 65 - 99 mg/dL  Glucose, capillary     Status: Abnormal   Collection Time: 04/23/15 11:26 AM  Result Value Ref Range   Glucose-Capillary 165 (H) 65 - 99 mg/dL   Comment 1 Notify RN   Glucose, capillary     Status: None   Collection Time: 04/23/15  4:35 PM  Result Value Ref Range   Glucose-Capillary 99 65 - 99 mg/dL   Comment 1 Notify RN   Glucose, capillary      Status: Abnormal   Collection Time: 04/23/15  8:42 PM  Result Value Ref Range   Glucose-Capillary 170 (H) 65 - 99 mg/dL   Comment 1 Notify RN   Glucose, capillary     Status: Abnormal   Collection Time: 04/24/15  6:28 AM  Result Value Ref Range   Glucose-Capillary 193 (H) 65 - 99 mg/dL   Comment 1 Notify RN      HEENT: normal Cardio: RRR and no murmurs Resp: CTA B/L and unlabored GI: BS positive and nontender nondistended Extremity:  Pulses positive and Right distal stump Edema Skin:   Wound dried blood under dressing, mild maceration around incision midpoint. Staples intact Neuro: Alert/Oriented, Agitated and Abnormal Motor Normal motor in BUE and decreased R HF due to pain, Left HF 4/5 Musc/Skel:  Extremity tender Right stump, no active drainage, indentation mid stump, left stump non tender Gen NAD   Assessment/Plan: 1. 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 Physiatrist is providing close team supervision and 24 hour management of active medical problems listed below. Physiatrist and rehab team continue to assess barriers to discharge/monitor patient  progress toward functional and medical goals.  FIM: Function - Bathing Bathing activity did not occur:  (nursing assisted last night) Position: Shower Body parts bathed by patient: Right arm, Left arm, Chest, Abdomen, Front perineal area, Buttocks, Right upper leg, Left upper leg, Left lower leg Bathing not applicable: Right lower leg Assist Level: More than reasonable time  Function- Upper Body Dressing/Undressing What is the patient wearing?: [place]ick over shirt/dress - Perfomed by patient: Thread/unthread right sleeve, Thread/unthread left sleeve, Put head through opening, Pull shirt over trunk Assist Level: Set up Set up : To obtain clothing/put away Function - Lower Body Dressing/Undressing What is the  patient wearing?: Pants Position: Wheelchair/chair at sink Underwear - Performed by patient: Thread/unthread right underwear leg, Thread/unthread left underwear leg Pants- Performed by patient: Thread/unthread right pants leg, Thread/unthread left pants leg, Pull pants up/down Pants- Performed by helper: Pull pants up/down Non-skid slipper socks- Performed by patient:  (bilateral amputee) Assist for footwear: Supervision/touching assist Assist for lower body dressing: Supervision or verbal cues Set up : To obtain clothing/put away     Function - Toilet Transfers Toilet transfer assistive device: Drop arm commode Assist level to toilet: Touching or steadying assistance (Pt > 75%) Assist level from toilet: Touching or steadying assistance (Pt > 75%)  Function - Chair/bed transfer Chair/bed transfer method: Lateral scoot Chair/bed transfer assist level: Moderate assist (Pt 50 - 74%/lift or lower) Chair/bed transfer assistive device: Armrests Chair/bed transfer details: Visual cues/gestures for precautions/safety  Function - Locomotion: Wheelchair Will patient use wheelchair at discharge?: Yes Type: Manual (Manual in rehab, motorized at home) Max wheelchair distance: 250 Assist Level: No help, No cues, assistive device, takes more than reasonable amount of time Assist Level: No help, No cues, assistive device, takes more than reasonable amount of time Assist Level: No help, No cues, assistive device, takes more than reasonable amount of time Turns around,maneuvers to table,bed, and toilet,negotiates 3% grade,maneuvers on rugs and over doorsills: No Function - Locomotion: Ambulation Ambulation activity did not occur: Safety/medical concerns (bilateral amputee) Assistive device: Parallel bars, Prothesis Max distance: 10 Assist level: Touching or steadying assistance (Pt > 75%) Walk 10 feet activity did not occur: Safety/medical concerns Assist level: Touching or steadying assistance  (Pt > 75%) Walk 50 feet with 2 turns activity did not occur: Safety/medical concerns Walk 150 feet activity did not occur: Safety/medical concerns Walk 10 feet on uneven surfaces activity did not occur: Safety/medical concerns  Function - Comprehension Comprehension: Auditory Comprehension assist level: Follows complex conversation/direction with extra time/assistive device  Function - Expression Expression: Verbal Expression assist level: Expresses complex 90% of the time/cues < 10% of the time  Function - Social Interaction Social Interaction assist level: Interacts appropriately with others with medication or extra time (anti-anxiety, antidepressant).  Function - Problem Solving Problem solving assist level: Solves basic problems with no assist  Function - Memory Memory assist level: More than reasonable amount of time Patient normally able to recall (first 3 days only): Current season, Location of own room, Staff names and faces, That he or she is in a hospital  Medical Problem List and Plan: 1. 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  DVT Prophylaxis/Anticoagulation: Pharmaceutical: Xarelto 3. Pain Management:  Continue Oxycodone prn.   4. Mood: LCSW to follow for evaluation and support.   5. Neuropsych: This patient is capable of making decisions on his own behalf. 6. Skin/Wound Care: Routine pressure relief measures. Maintain adequate nutrition  and hydration status. Add protein supplement due to low protein stores.   7. Fluids/Electrolytes/Nutrition: Monitor I/O. Check lytes in am.   8. DM type 2: Poorly controlled due to non-compliance with lantus. Discussed importance of medication compliance to help with wound healing. Will monitor BS ac/hs. Continue Lantus daily with meal coverage.  increase lantus 9. HTN: Monitor BP bid--poorly controlled at this time and could be pain mediated. Continue Cardizem and lisinopril daily. Titrate  medications as indicated.   10. PAF: Monitor HR bid and for any symptoms with activity. On cardizem CD daily No BB , rate controlled at 91bpm,  May have been due to cocaine use. 11. ABLA: Add iron supplement. hgb mildly reduced 12.4  12. Constipation: Increase miralax to bid.   13. Hypokalemia: resolved   LOS (Days) 5 A FACE TO FACE EVALUATION WAS PERFORMED  KIRSTEINS,ANDREW E 04/24/2015, 9:40 AM

## 2015-04-24 NOTE — Progress Notes (Signed)
Occupational Therapy Session Note  Patient Details  Name: Steven [name] MRN: [id number] Date of Birth: 05/21/1961  Today's Date: 04/24/2015 OT Individual Time: 1430-1530 OT Individual Time Calculation (min): 60 min    Short Term Goals: No short term goals set  Skilled Therapeutic Interventions/Progress Updates:    Pt seen this session to facilitate ADL and functional mobility skills needed for returning home in a few days. Pt stated that he needed to toilet, but wanted to use toilet in ADL apt.  Pt propelled himself to apt. Pt was positioning himself and the w/c at an awkward angle and suggested a different angle. Pt became upset and that this suggestion was confusing him. He stated he can do these transfers with no help.  Therapist thought pt wanted to complete a squat pivot, but pt pulled himself up with grab bar and pivoted onto toilet. He asked for privacy and therapist stood outside door waiting to be called for when pt was ready to transfer back.  Pt called therapist once he was back in the chair and his pants were pulled up.  "See I can do this myself".  Pt's leg dressing had fallen off. Returned to his room to rewrap leg. Doffed pants for easier wrapping. Reviewed with demonstration technique wrapping technique with pt.  Pt in room resting with all needs met.   Therapy Documentation Precautions:  Precautions Precautions: Fall Precaution Comments: prostheses are at the hospital, but not the inserts Restrictions Weight Bearing Restrictions: Yes RLE Weight Bearing: Non weight bearing Therapy Vitals Temp: 98.3 F (36.8 C) Temp Source: Oral Pulse Rate: 88 Resp: 18 BP: 109/66 mmHg Patient Position (if appropriate): Sitting Oxygen Therapy SpO2: 100 % O2 Device: Not Delivered Pain: Pain Assessment Pain Assessment: 0-10 Pain Score: 6  Pain Type: Acute pain Pain Location: Leg Pain Orientation: Right Pain Descriptors / Indicators: Sore Pain Onset: On-going Pain  Intervention(s): Medication (See eMAR) ADL: ADL ADL Comments: see function navigator   See Function Navigator for Current Functional Status.   Therapy/Group: Individual Therapy  Coolidge Gossard 04/24/2015, 4:20 PM

## 2015-04-24 NOTE — Progress Notes (Signed)
Physical Therapy Session Note  Patient Details  Name: Steven [name] MRN: [id number] Date of Birth: Oct 18, 1960  Today's Date: 04/24/2015 PT Individual Time: 0930-1030 PT Individual Time Calculation (min): 60 min   Short Term Goals: Week 1:  PT Short Term Goal 1 (Week 1): = LTG due to short LOS  Skilled Therapeutic Interventions/Progress Updates:  Pt was seen bedside in the am. Pt transferred recliner to w/c with lateral transfers and min A for safety. Pt propelled w/c about 150 feet with B UEs and no assist. Pt transferred w/c to edge of mat lateral transfer with min guard and verbal cues. Pt transferred edge of mat to supine with no assist. Pt performed B heel slides, hip abd/add, L SAQs, and bridging, 3 sets x 10 reps each. Pt transferred supine to edge of mat with no assist and increased time. Pt transferred edge of mat to w/c lateral transfer with min guard and verbal cues. Pt propelled w/c back to room with B UEs and no assist. Pt transferred w/c to recliner lateral transfer with min guard for safety. Pt left sitting up in recliner with call bell within reach.   Therapy Documentation Precautions:  Precautions Precautions: Fall Precaution Comments: prostheses are at the hospital, but not the inserts Restrictions Weight Bearing Restrictions: Yes RLE Weight Bearing: Non weight bearing General:   Pain: Pt c/o 9/10 pain R AKA.   See Function Navigator for Current Functional Status.   Therapy/Group: Individual Therapy  Dub Amis 04/24/2015, 12:36 PM

## 2015-04-24 NOTE — Progress Notes (Signed)
Physical Therapy Session Note  Patient Details  Name: Steven [name] MRN: [id number] Date of Birth: 01/07/1961  Today's Date: 04/24/2015 PT Individual Time: 1300-1415 PT Individual Time Calculation (min): 75 min   Short Term Goals: Week 1:  PT Short Term Goal 1 (Week 1): = LTG due to short LOS  Skilled Therapeutic Interventions/Progress Updates:  Pt was seen bedside in the pm. Pt transferred recliner to w/c with lateral transfer and c/s to min guard with verbal cues. Pt propelled w/c to rehab gym with B UEs and no assist. Pt performed 3 sets x 10 reps each arm chair push ups. Pt performed 3 sets x 10 reps LAQs L LE only, 2 sets x 10 reps B hip flex. Pt rode Nu-step x 6 minutes on level 1, utilizing B UEs and L LE. Pt transferred to and from Nu-step with lateral transfer and min guard with verbal cues. Pt stood in parallel bars x 3 with min A and verbal cues. While standing in parallel bars, pt performed R hip exercises for strengthening and ROM. Pt propelled w/c back to room with B UEs and no assist.   Therapy Documentation Precautions:  Precautions Precautions: Fall Precaution Comments: prostheses are at the hospital, but not the inserts Restrictions Weight Bearing Restrictions: Yes RLE Weight Bearing: Non weight bearing General:   Pain: Pt c/o 8/10 pain R AKA.   See Function Navigator for Current Functional Status.   Therapy/Group: Individual Therapy  Dub Amis 04/24/2015, 3:09 PM

## 2015-04-24 NOTE — Plan of Care (Signed)
Problem: RH PAIN MANAGEMENT Goal: RH STG PAIN MANAGED AT OR BELOW PT'S PAIN GOAL Pain level at or below 4  Outcome: Not Progressing Pain managed to 8 with scheduled/prn Pain meds

## 2015-04-25 ENCOUNTER — Inpatient Hospital Stay (HOSPITAL_COMMUNITY): Payer: Medicaid Other | Admitting: Occupational Therapy

## 2015-04-25 LAB — GLUCOSE, CAPILLARY
GLUCOSE-CAPILLARY: 127 mg/dL — AB (ref 65–99)
GLUCOSE-CAPILLARY: 106 mg/dL — AB (ref 65–99)
Glucose-Capillary: 135 mg/dL — ABNORMAL HIGH (ref 65–99)
Glucose-Capillary: 158 mg/dL — ABNORMAL HIGH (ref 65–99)

## 2015-04-25 NOTE — Progress Notes (Signed)
Subjective/Complaints: Slept well,Pain this morning but did not bother him last night "Not cussing anymore " Happy about getting his leg checked every day ROS:   Pt without SOB, CP,no N/V/D Objective: Vital Signs: Blood pressure 127/79, pulse 87, temperature 99.1 F (37.3 C), temperature source Oral, resp. rate 18, height 6\' 4"  (1.93 m), weight 91.581 kg (201 lb 14.4 oz), SpO2 100 %. No results found. Results for orders placed or performed during the hospital encounter of 04/19/15 (from the past 72 hour(s))  Glucose, capillary     Status: Abnormal   Collection Time: 04/22/15 11:55 AM  Result Value Ref Range   Glucose-Capillary 129 (H) 65 - 99 mg/dL  Glucose, capillary     Status: None   Collection Time: 04/22/15  5:00 PM  Result Value Ref Range   Glucose-Capillary 98 65 - 99 mg/dL  Glucose, capillary     Status: Abnormal   Collection Time: 04/22/15  9:12 PM  Result Value Ref Range   Glucose-Capillary 129 (H) 65 - 99 mg/dL  Glucose, capillary     Status: Abnormal   Collection Time: 04/23/15  6:35 AM  Result Value Ref Range   Glucose-Capillary 111 (H) 65 - 99 mg/dL  Glucose, capillary     Status: Abnormal   Collection Time: 04/23/15 11:26 AM  Result Value Ref Range   Glucose-Capillary 165 (H) 65 - 99 mg/dL   Comment 1 Notify RN   Glucose, capillary     Status: None   Collection Time: 04/23/15  4:35 PM  Result Value Ref Range   Glucose-Capillary 99 65 - 99 mg/dL   Comment 1 Notify RN   Glucose, capillary     Status: Abnormal   Collection Time: 04/23/15  8:42 PM  Result Value Ref Range   Glucose-Capillary 170 (H) 65 - 99 mg/dL   Comment 1 Notify RN   Glucose, capillary     Status: Abnormal   Collection Time: 04/24/15  6:28 AM  Result Value Ref Range   Glucose-Capillary 193 (H) 65 - 99 mg/dL   Comment 1 Notify RN   Glucose, capillary     Status: Abnormal   Collection Time: 04/24/15 11:28 AM  Result Value Ref Range   Glucose-Capillary 160 (H) 65 - 99 mg/dL   Comment 1  Notify RN   Glucose, capillary     Status: Abnormal   Collection Time: 04/24/15  8:41 PM  Result Value Ref Range   Glucose-Capillary 105 (H) 65 - 99 mg/dL   Comment 1 Notify RN   Glucose, capillary     Status: Abnormal   Collection Time: 04/25/15  6:32 AM  Result Value Ref Range   Glucose-Capillary 127 (H) 65 - 99 mg/dL   Comment 1 Notify RN       Extremity:  Pulses positive and Right distal stump Edema Skin:   Wound dried Serosanguineous drainage on dressing, no maceration around incision midpoint. Staples intact Neuro: Alert/Oriented, Agitated and Abnormal Motor Normal motor in BUE and decreased R HF due to pain, Left HF 4/5 Musc/Skel:  Extremity tender Right stump, no active drainage, indentation mid stump, left stump non tender Gen NAD   Assessment/Plan: 1. 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 Physiatrist is providing close team supervision and 24 hour management of active medical problems listed below. Physiatrist and rehab team continue to assess barriers to discharge/monitor patient progress toward functional and medical goals.  FIM: Function - Bathing Bathing activity did not occur:  (nursing assisted last night) Position: Shower Body parts bathed by patient: Right arm, Left arm, Chest, Abdomen, Front perineal area, Buttocks, Right upper leg, Left upper leg Bathing not applicable: Left lower leg, Right lower leg Assist Level: More than reasonable time  Function- Upper Body Dressing/Undressing What is the patient wearing?: Hospital gown Pull over shirt/dress - Perfomed by patient: Thread/unthread right sleeve, Thread/unthread left sleeve, Put head through opening, Pull shirt over trunk Assist Level: Set up Set up : To obtain clothing/put away Function - Lower Body Dressing/Undressing What is the patient wearing?: Pants Position: Wheelchair/chair at  sink Underwear - Performed by patient: Thread/unthread right underwear leg, Thread/unthread left underwear leg Pants- Performed by patient: Thread/unthread right pants leg, Thread/unthread left pants leg, Pull pants up/down Pants- Performed by helper: Pull pants up/down Non-skid slipper socks- Performed by patient:  (bilateral amputee) Assist for footwear: Supervision/touching assist Assist for lower body dressing: Supervision or verbal cues Set up : To obtain clothing/put away  Function - Toileting Toileting steps completed by patient: Adjust clothing after toileting, Adjust clothing prior to toileting, Performs perineal hygiene Toileting Assistive Devices: Grab bar or rail Assist level: More than reasonable time  Function - Air cabin crew transfer assistive device: Grab bar Assist level to toilet: Supervision or verbal cues Assist level from toilet: No Help, no cues, assistive device, takes more than a reasonable amount of time  Function - Chair/bed transfer Chair/bed transfer method: Lateral scoot Chair/bed transfer assist level: Touching or steadying assistance (Pt > 75%) Chair/bed transfer assistive device: Armrests Chair/bed transfer details: Visual cues/gestures for precautions/safety  Function - Locomotion: Wheelchair Will patient use wheelchair at discharge?: Yes Type: Manual Max wheelchair distance: 150 Assist Level: No help, No cues, assistive device, takes more than reasonable amount of time Assist Level: No help, No cues, assistive device, takes more than reasonable amount of time Assist Level: No help, No cues, assistive device, takes more than reasonable amount of time Turns around,maneuvers to table,bed, and toilet,negotiates 3% grade,maneuvers on rugs and over doorsills: No Function - Locomotion: Ambulation Ambulation activity did not occur: Safety/medical concerns (bilateral amputee) Assistive device: Parallel bars, Prothesis Max distance: 10 Assist  level: Touching or steadying assistance (Pt > 75%) Walk 10 feet activity did not occur: Safety/medical concerns Assist level: Touching or steadying assistance (Pt > 75%) Walk 50 feet with 2 turns activity did not occur: Safety/medical concerns Walk 150 feet activity did not occur: Safety/medical concerns Walk 10 feet on uneven surfaces activity did not occur: Safety/medical concerns  Function - Comprehension Comprehension: Auditory Comprehension assist level: Follows complex conversation/direction with no assist  Function - Expression Expression: Verbal Expression assist level: Expresses complex 90% of the time/cues < 10% of the time  Function - Social Interaction Social Interaction assist level: Interacts appropriately with others with medication or extra time (anti-anxiety, antidepressant).  Function - Problem Solving Problem solving assist level: Solves basic problems with no assist  Function - Memory Memory assist level: More than reasonable amount of time Patient normally able to recall (first 3 days only): Current season, Location of own room, Staff names and faces, That he or she is in a hospital  Medical Problem List and Plan: 1. 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  DVT Prophylaxis/Anticoagulation: Pharmaceutical: Xarelto 3. Pain Management:  Continue Oxycodone prn.   4. Mood: LCSW to follow for evaluation and support.   5. Neuropsych: This patient  is capable of making decisions on his own behalf. 6. Skin/Wound Care: Routine pressure relief measures. Maintain adequate nutrition and hydration status. Add protein supplement due to low protein stores.   7. Fluids/Electrolytes/Nutrition: Monitor I/O. Check lytes in am.   8. DM type 2: Poorly controlled due to non-compliance with lantus. Discussed importance of medication compliance to help with wound healing. Will monitor BS ac/hs. Continue Lantus daily with meal coverage.  increase  lantus 9. HTN: Monitor BP bid--poorly controlled at this time and could be pain mediated. Continue Cardizem and lisinopril daily. Titrate medications as indicated.   10. PAF: Monitor HR bid and for any symptoms with activity. On cardizem CD daily No BB , rate controlled at 87bpm,  May have been due to cocaine use. 11. ABLA: Add iron supplement. hgb mildly reduced 12.4  12. Constipation: Increase miralax to bid.   13. Hypokalemia: resolved   LOS (Days) 6 A FACE TO FACE EVALUATION WAS PERFORMED  KIRSTEINS,ANDREW E 04/25/2015, 10:00 AM

## 2015-04-25 NOTE — Plan of Care (Signed)
Problem: RH PAIN MANAGEMENT Goal: RH STG PAIN MANAGED AT OR BELOW PT'S PAIN GOAL Pain level at or below 4  Outcome: Not Progressing Pain no lower than a 5

## 2015-04-25 NOTE — Progress Notes (Signed)
Occupational Therapy Session Note  Patient Details  Name: Steven [name] MRN: [id number] Date of Birth: May 18, 1961  Today's Date: 04/25/2015 OT Individual Time: [id number] OT Individual Time Calculation (min): 60 min    Short Term Goals: Week 1:  OT Short Term Goal 1 (Week 1): LTG=STG  Skilled Therapeutic Interventions/Progress Updates: Patient participated in skilled OT to complete bed mobility, transfer into w/c and then bathe and dress in w/c at sink.  His fuctnional status was as follows:  Bed to w/c transfer= close S and keep an eye to be sure w/c did not scoot on the floor with pressure - it did not                  UB batheing and dressing=set up       LB bathingand dressing = Mod A as patient maitained balance at sink while clinician bathed periarea buttocks and patient attempted to help pull up pants.....   Patient will benfit from more opportunities to complete static and dynamic balance with supports    Therapy Documentation Precautions:  Precautions Precautions: Fall Precaution Comments: prostheses are at the hospital, but not the inserts Restrictions Weight Bearing Restrictions: Yes RLE Weight Bearing: Non weight bearing    Pain:8/10 but stated he did not want to take meds at that time - would work through it Pain Assessment Pain Assessment: 0-10 Pain Score: 8  Pain Type: Surgical pain Pain Location: Leg Pain Orientation: Right Pain Descriptors / Indicators: Sore Pain Frequency: Intermittent Pain Onset: With Activity Pain Intervention(s): Medication (See eMAR) (prior to dressing change) See Function Navigator for Current Functional Status.   Therapy/Group: Individual Therapy  Alfredia Ferguson [facility] 04/25/2015, 12:42 PM

## 2015-04-25 NOTE — Plan of Care (Signed)
Problem: RH SKIN INTEGRITY Goal: RH STG ABLE TO PERFORM INCISION/WOUND CARE W/ASSISTANCE STG Able To Perform Incision/Wound Care With min Assistance.  Outcome: Not Progressing Staff currently perform dressing changes.

## 2015-04-26 ENCOUNTER — Inpatient Hospital Stay (HOSPITAL_COMMUNITY): Payer: Medicaid Other

## 2015-04-26 ENCOUNTER — Inpatient Hospital Stay (HOSPITAL_COMMUNITY): Payer: Medicaid Other | Admitting: Occupational Therapy

## 2015-04-26 ENCOUNTER — Inpatient Hospital Stay (HOSPITAL_COMMUNITY): Payer: Medicaid Other | Admitting: Physical Therapy

## 2015-04-26 LAB — GLUCOSE, CAPILLARY
GLUCOSE-CAPILLARY: 113 mg/dL — AB (ref 65–99)
GLUCOSE-CAPILLARY: 150 mg/dL — AB (ref 65–99)
Glucose-Capillary: 187 mg/dL — ABNORMAL HIGH (ref 65–99)
Glucose-Capillary: 120 mg/dL — ABNORMAL HIGH (ref 65–99)

## 2015-04-26 NOTE — Progress Notes (Addendum)
Physical Therapy Session Note  Patient Details  Name: KRISHNA SCHNETZER MRN: [id number] Date of Birth: 03-03-1961  Today's Date: 04/26/2015 PT Individual Time: 0900-1000 PT Individual Time Calculation (min): 60 min   Short Term Goals: Week 1:  PT Short Term Goal 1 (Week 1): = LTG due to short LOS     Skilled Therapeutic Interventions/Progress Updates: initially pt refused, stating pain 11/10.  He had just received pain meds; residual limb not wrapped..  With encouragement, he agreed to have wound dressed and ACE applied.  Several bloody areas at staples.  PT consulted RN about wound care.  Pt participated with ACE wrapping, but did not understand the concept of avoiding mostly circular wraps with ACE.  Max assist to wrap residual limb.  Modified independent with rolling L><R and use of urinal in bed, and modified independent for directing care of cleansing wound.  Pt left in supine with all needs in place. Pain rated 6/10 by end of session.  PT stressed to pt that wound care and wrapping often decreases pain.    Therapy Documentation Precautions:  Precautions Precautions: Fall Precaution Comments: L BKA prosthesis Restrictions Weight Bearing Restrictions: Yes RLE Weight Bearing: Non weight bearing       See Function Navigator for Current Functional Status.   Therapy/Group: Individual Therapy  Nekita Pita 04/26/2015, 5:22 PM

## 2015-04-26 NOTE — Progress Notes (Signed)
Physical Therapy Session Note  Patient Details  Name: Steven [name] MRN: [id number] Date of Birth: [birth year]  Today's Date: 04/26/2015 PT Individual Time: 1405-1507 PT Individual Time Calculation (min): 62 min   Short Term Goals: Week 1:  PT Short Term Goal 1 (Week 1): = LTG due to short LOS  Skilled Therapeutic Interventions/Progress Updates:  Pt received resting in w/c and agreeable to therapy session.  Session focused on reassessment of pt's balance, functional mobility, strength, and coordination.  Pt currently performing bed<>chair transfers mod I and car transfers with supervision and verbal cues for sequencing and hand placement.  PT instructed patient in 15 reps of lat pull downs and bicep curls with universal gym at 10-30#.  Pt performed sit>stand in // bars with steady assist to rise and min assist to maintain dynamic standing balance reaching for therapist's hand in coronal plane.  Pt returned to room in w/c mod I at end of session and PT assisted pt to toilet.  Pt rolled w/c into bathroom and was unsure how to perform transfer.  PT set up w/c and provided verbal cues for hand placement and sequencing.  Pt able to come to standing with supervision but then required mod assist to pivot to toilet. Pt left sitting on toilet with pt stating he will call for assist to get back to w/c.  Call light in reach.    Therapy Documentation Precautions:  Precautions Precautions: Fall Precaution Comments: L BKA prosthesis Restrictions Weight Bearing Restrictions: Yes RLE Weight Bearing: Non weight bearing Pain: Pain Assessment Pain Assessment: 0-10 Pain Score: 5  Pain Intervention(s): Emotional support;RN made aware  See Function Navigator for Current Functional Status.   Therapy/Group: Individual Therapy  Earnest Conroy Penven-Crew 04/26/2015, 4:13 PM

## 2015-04-26 NOTE — Progress Notes (Addendum)
Occupational Therapy Discharge Summary  Patient Details  Name: Steven [name] MRN: [id number] Date of Birth: 10/29/1960 Time:  1104-1159 Minutes: 55 mins  Session Note:  Pt completed transfer from bed to wheelchair and gathered all supplies needed for bathing and dressing tasks.  He was able to roll into the shower and transfer to the tub bench for bathing with modified independence.  Pt completed bathing without assistance as well as transfer to the wheelchair once he was finished drying off.  He was able to complete dressing sitting with lateral leans sitting on the bed before returning to wheelchair.   Patient has met 9 of 9 long term goals due to improved activity tolerance, improved balance, postural control and ability to compensate for deficits.  Pt made steady progress with BADLs during this admission and is mod I for bathing, dressing, and functional transfers (with/without LLE prosthesis). Family has not been present for therapy sessions. Patient to discharge at overall Modified Independent level.  Patient's care partner unavailable to provide the necessary physical assistance at discharge.  At the time of this discharge current therapist seeing him today did not provide any education with family as they were not present.  Pt reports his girlfriend may help out some but he will be alone most of the time.     Recommendation:  Patient will benefit from ongoing skilled OT services in home health setting to continue to advance functional skills in the area of BADL.  Recommend HHOT for safety and for environmental setup for most success.  Pt is modified independent in controlled environment of the hospital.  Equipment: No equipment providedPt owns necessary equipment  Reasons for discharge: treatment goals met and discharge from hospital  Patient/family agrees with progress made and goals achieved: Yes  OT Discharge Vision/Perception  Vision- History Baseline Vision/History: Wears  glasses Wears Glasses: At all times Patient Visual Report: No change from baseline  Cognition Overall Cognitive Status: Within Functional Limits for tasks assessed Arousal/Alertness: Awake/alert Orientation Level: Oriented X4 Attention: Selective Selective Attention: Appears intact Awareness: Appears intact Problem Solving: Appears intact Safety/Judgment: Appears intact Sensation Sensation Light Touch: Appears Intact Stereognosis: Appears Intact Hot/Cold: Appears Intact Proprioception: Appears Intact Additional Comments: Sensation intact in BUEs Coordination Gross Motor Movements are Fluid and Coordinated: Yes Fine Motor Movements are Fluid and Coordinated: Yes Motor  Motor Motor: Abnormal postural alignment and control Motor - Discharge Observations: Pt with increased posterior pelvic tilt at times when sitting on soft surface. Trunk/Postural Assessment  Cervical Assessment Cervical Assessment: Within Functional Limits Thoracic Assessment Thoracic Assessment: Within Functional Limits Lumbar Assessment Lumbar Assessment: Within Functional Limits Postural Control Postural Control:  (Posterior pelvic tilt and posterior lean in sitting on EOB)  Balance Balance Balance Assessed: Yes Static Sitting Balance Static Sitting - Balance Support: Feet unsupported;No upper extremity supported Static Sitting - Level of Assistance: 7: Independent Dynamic Sitting Balance Dynamic Sitting - Balance Support: Feet unsupported;During functional activity Dynamic Sitting - Level of Assistance: 6: Modified independent (Device/Increase time) Extremity/Trunk Assessment RUE Assessment RUE Assessment: Within Functional Limits LUE Assessment LUE Assessment: Within Functional Limits   See Function Navigator for Current Functional Status.  , OTR/L 04/27/2015, 5:31 PM  

## 2015-04-26 NOTE — Progress Notes (Signed)
Physical Therapy Discharge Summary  Patient Details  Name: Steven [name] MRN: [id number] Date of Birth: Jan 17, 1961  Today's Date: 04/26/2015 PT Individual Time: 1405-1507 PT Individual Time Calculation (min): 62 min    Patient has met 8 of 8 long term goals due to improved activity tolerance, improved balance, improved postural control, decreased pain, ability to compensate for deficits and improved coordination.  Patient to discharge at a wheelchair level Modified Independent.   Patient's care partner is independent to provide the necessary assistance at discharge.  Recommendation:  Patient will benefit from ongoing skilled PT services in home health setting to continue to advance safe functional mobility, address ongoing impairments in strength, balance, coordination, postural control, and ROM, and minimize fall risk.  Equipment: No equipment provided  Reasons for discharge: treatment goals met  Patient/family agrees with progress made and goals achieved: Yes    PT Discharge Precautions/Restrictions Precautions Precautions: Fall Precaution Comments: L BKA prosthesis Restrictions Weight Bearing Restrictions: Yes RLE Weight Bearing: Non weight bearing Vital Signs Therapy Vitals Temp: 98.4 F (36.9 C) Temp Source: Oral Pulse Rate: 83 Resp: 18 BP: 126/68 mmHg Patient Position (if appropriate): Sitting Oxygen Therapy SpO2: 98 % O2 Device: Not Delivered Pain Pain Assessment Pain Assessment: 0-10 Pain Score: 5  Pain Intervention(s): Emotional support;RN made aware Vision/Perception  Perception Comments: WFL  Cognition Overall Cognitive Status: Within Functional Limits for tasks assessed Arousal/Alertness: Awake/alert Orientation Level: Oriented to person;Disoriented to time;Oriented to place;Oriented to situation Attention: Selective Selective Attention: Appears intact Awareness: Appears intact Problem Solving: Appears intact Safety/Judgment: Appears  intact Sensation Sensation Light Touch: Appears Intact Stereognosis: Appears Intact Hot/Cold: Appears Intact Coordination Gross Motor Movements are Fluid and Coordinated: Yes Fine Motor Movements are Fluid and Coordinated: Yes Motor  Motor Motor: Abnormal postural alignment and control Motor - Discharge Observations: pt presents with poterior/lateral lean possible secondary to pain in RLE  Mobility Bed mobility: modified independent for rolling L><R.  Transfers Transfers: Yes Squat Pivot Transfers: 6: Modified independent (Device/Increase time) Lateral/Scoot Transfers: 6: Modified independent (Device/Increase time) Locomotion  Ambulation Ambulation: No Gait Gait: No Stairs / Additional Locomotion Stairs: No Wheelchair Mobility Wheelchair Mobility: Yes Wheelchair Assistance: 6: Modified independent (Device/Increase time) Environmental health practitioner: Both upper extremities Wheelchair Parts Management: Independent Distance: 200  Trunk/Postural Assessment  Cervical Assessment Cervical Assessment: Within Functional Limits Thoracic Assessment Thoracic Assessment: Within Functional Limits Lumbar Assessment Lumbar Assessment: Within Functional Limits Postural Control Postural Control: Within Functional Limits  Balance Balance Balance Assessed: Yes Static Sitting Balance Static Sitting - Balance Support: Feet supported;No upper extremity supported Static Sitting - Level of Assistance: 7: Independent Dynamic Sitting Balance Dynamic Sitting - Balance Support: Feet supported;No upper extremity supported Dynamic Sitting - Level of Assistance: 7: Independent Dynamic Sitting - Balance Activities: Ball toss Sitting balance - Comments: able to sit unsupported with UEs free for ADL Static Standing Balance Static Standing - Balance Support: Bilateral upper extremity supported Static Standing - Level of Assistance: 5: Stand by assistance Dynamic Standing Balance Dynamic Standing -  Balance Support: Left upper extremity supported;Right upper extremity supported Dynamic Standing - Level of Assistance: 4: Min assist Dynamic Standing - Balance Activities: Reaching for objects Extremity Assessment  RLE Assessment RLE Assessment: Exceptions to [facility] RLE Strength RLE Overall Strength Comments: 3/5 hip flexion LLE Assessment LLE Assessment: Within Functional Limits   See Function Navigator for Current Functional Status.  Talin Feister E Penven-Crew 04/26/2015, 3:49 PM

## 2015-04-26 NOTE — Progress Notes (Signed)
Subjective/Complaints: Complaining that he did not get his pain medicine yet this morning. Looking forward to discharge but is concerned about having pain medications. We discussed that he would be discharged with pain medication. ROS:   Pt without SOB, CP,no N/V/D Objective: Vital Signs: Blood pressure 141/73, pulse 86, temperature 99.2 F (37.3 C), temperature source Oral, resp. rate 18, height 6\' 4"  (1.93 m), weight 91.581 kg (201 lb 14.4 oz), SpO2 94 %. No results found. Results for orders placed or performed during the hospital encounter of 04/19/15 (from the past 72 hour(s))  Glucose, capillary     Status: Abnormal   Collection Time: 04/23/15 11:26 AM  Result Value Ref Range   Glucose-Capillary 165 (H) 65 - 99 mg/dL   Comment 1 Notify RN   Glucose, capillary     Status: None   Collection Time: 04/23/15  4:35 PM  Result Value Ref Range   Glucose-Capillary 99 65 - 99 mg/dL   Comment 1 Notify RN   Glucose, capillary     Status: Abnormal   Collection Time: 04/23/15  8:42 PM  Result Value Ref Range   Glucose-Capillary 170 (H) 65 - 99 mg/dL   Comment 1 Notify RN   Glucose, capillary     Status: Abnormal   Collection Time: 04/24/15  6:28 AM  Result Value Ref Range   Glucose-Capillary 193 (H) 65 - 99 mg/dL   Comment 1 Notify RN   Glucose, capillary     Status: Abnormal   Collection Time: 04/24/15 11:28 AM  Result Value Ref Range   Glucose-Capillary 160 (H) 65 - 99 mg/dL   Comment 1 Notify RN   Glucose, capillary     Status: Abnormal   Collection Time: 04/24/15  8:41 PM  Result Value Ref Range   Glucose-Capillary 105 (H) 65 - 99 mg/dL   Comment 1 Notify RN   Glucose, capillary     Status: Abnormal   Collection Time: 04/25/15  6:32 AM  Result Value Ref Range   Glucose-Capillary 127 (H) 65 - 99 mg/dL   Comment 1 Notify RN   Glucose, capillary     Status: Abnormal   Collection Time: 04/25/15 11:40 AM  Result Value Ref Range   Glucose-Capillary 135 (H) 65 - 99 mg/dL   Comment 1 Notify RN   Glucose, capillary     Status: Abnormal   Collection Time: 04/25/15  4:31 PM  Result Value Ref Range   Glucose-Capillary 106 (H) 65 - 99 mg/dL   Comment 1 Notify RN   Glucose, capillary     Status: Abnormal   Collection Time: 04/25/15  9:26 PM  Result Value Ref Range   Glucose-Capillary 158 (H) 65 - 99 mg/dL   Comment 1 Notify RN   Glucose, capillary     Status: Abnormal   Collection Time: 04/26/15  7:00 AM  Result Value Ref Range   Glucose-Capillary 113 (H) 65 - 99 mg/dL   Comment 1 Notify RN       Extremity:  Pulses positive and Right distal stump Edema Skin:   Wound dried Serosanguineous drainage on dressing, no maceration around incision midpoint. Staples intact  Musc/Skel:  Extremity tender Right stump, no active drainage, indentation mid stump, left stump non tender Gen NAD   Assessment/Plan: 1. 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 Physiatrist is providing close team supervision and 24 hour management of active medical problems listed  below. Physiatrist and rehab team continue to assess barriers to discharge/monitor patient progress toward functional and medical goals. Plan discharge in a.m. FIM: Function - Bathing Bathing activity did not occur:  (nursing assisted last night) Position: Shower Body parts bathed by patient: Right arm, Left arm, Chest, Abdomen, Front perineal area, Buttocks, Right upper leg, Left upper leg Bathing not applicable: Left lower leg, Right lower leg Assist Level: More than reasonable time  Function- Upper Body Dressing/Undressing What is the patient wearing?: Hospital gown Pull over shirt/dress - Perfomed by patient: Thread/unthread right sleeve, Thread/unthread left sleeve, Put head through opening, Pull shirt over trunk Assist Level: Set up Set up : To obtain clothing/put away Function - Lower Body  Dressing/Undressing What is the patient wearing?: Pants Position: Wheelchair/chair at sink Underwear - Performed by patient: Thread/unthread right underwear leg, Thread/unthread left underwear leg Pants- Performed by patient: Thread/unthread right pants leg, Thread/unthread left pants leg, Pull pants up/down Pants- Performed by helper: Pull pants up/down Non-skid slipper socks- Performed by patient:  (bilateral amputee) Assist for footwear: Supervision/touching assist Assist for lower body dressing: Supervision or verbal cues Set up : To obtain clothing/put away  Function - Toileting Toileting steps completed by patient: Adjust clothing after toileting, Adjust clothing prior to toileting, Performs perineal hygiene Toileting Assistive Devices: Grab bar or rail Assist level: More than reasonable time  Function - Air cabin crew transfer assistive device: Grab bar Assist level to toilet: Supervision or verbal cues Assist level from toilet: Supervision or verbal cues  Function - Chair/bed transfer Chair/bed transfer method: Lateral scoot Chair/bed transfer assist level: Touching or steadying assistance (Pt > 75%) Chair/bed transfer assistive device: Armrests Chair/bed transfer details: Visual cues/gestures for precautions/safety  Function - Locomotion: Wheelchair Will patient use wheelchair at discharge?: Yes Type: Manual Max wheelchair distance: 150 Assist Level: No help, No cues, assistive device, takes more than reasonable amount of time Assist Level: No help, No cues, assistive device, takes more than reasonable amount of time Assist Level: No help, No cues, assistive device, takes more than reasonable amount of time Turns around,maneuvers to table,bed, and toilet,negotiates 3% grade,maneuvers on rugs and over doorsills: No Function - Locomotion: Ambulation Ambulation activity did not occur: Safety/medical concerns (bilateral amputee) Assistive device: Parallel bars,  Prothesis Max distance: 10 Assist level: Touching or steadying assistance (Pt > 75%) Walk 10 feet activity did not occur: Safety/medical concerns Assist level: Touching or steadying assistance (Pt > 75%) Walk 50 feet with 2 turns activity did not occur: Safety/medical concerns Walk 150 feet activity did not occur: Safety/medical concerns Walk 10 feet on uneven surfaces activity did not occur: Safety/medical concerns  Function - Comprehension Comprehension: Auditory Comprehension assist level: Follows complex conversation/direction with no assist  Function - Expression Expression: Verbal Expression assist level: Expresses complex 90% of the time/cues < 10% of the time  Function - Social Interaction Social Interaction assist level: Interacts appropriately with others with medication or extra time (anti-anxiety, antidepressant).  Function - Problem Solving Problem solving assist level: Solves basic problems with no assist  Function - Memory Memory assist level: More than reasonable amount of time Patient normally able to recall (first 3 days only): Current season, Location of own room, Staff names and faces, That he or she is in a hospital  Medical Problem List and Plan: 1. 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  DVT Prophylaxis/Anticoagulation: Pharmaceutical: Xarelto, Likely contributing to serosanguineous drainage 3. Pain Management:  Continue Oxycodone prn.  4. Mood: LCSW to follow for evaluation and support.   5. Neuropsych: This patient is capable of making decisions on his own behalf. 6. Skin/Wound Care: Routine pressure relief measures. Maintain adequate nutrition and hydration status. Add protein supplement due to low protein stores.   7. Fluids/Electrolytes/Nutrition: Monitor I/O. Check lytes in am.   8. DM type 2: Poorly controlled due to non-compliance with lantus. Discussed importance of medication compliance to help with wound  healing. Will monitor BS ac/hs. Continue Lantus daily with meal coverageGood control 9. HTN: Monitor BP bid--poorly controlled at this time and could be pain mediated. Continue Cardizem and lisinopril daily. Titrate medications as indicated.   10. PAF: Monitor HR bid and for any symptoms with activity. On cardizem CD daily No BB , rate controlled at 87bpm,  May have been due to cocaine use. 11. ABLA: Improving   LOS (Days) 7 A FACE TO FACE EVALUATION WAS PERFORMED  Steven [name] E 04/26/2015, 8:50 AM

## 2015-04-26 NOTE — Plan of Care (Signed)
Problem: RH SKIN INTEGRITY Goal: RH STG ABLE TO PERFORM INCISION/WOUND CARE W/ASSISTANCE STG Able To Perform Incision/Wound Care With min Assistance.  Outcome: Not Met (add Reason) Requires staff to wrap R AKA

## 2015-04-26 NOTE — Progress Notes (Signed)
Occupational Therapy Session Note  Patient Details  Name: Steven [name] MRN: [id number] Date of Birth: Jun 14, 1960  Today's Date: 04/26/2015 OT Individual Time: [id number] OT Individual Time Calculation (min): 24 min    Skilled Therapeutic Interventions/Progress Updates:    Pt performed toilet transfer to regular toilet with rail to start session with modified independence.  He was able to complete all aspects of toilet hygiene and clothing in sitting as well with modified independence.  Once toileting was finished he rolled to the sink and washed his hands with modified independence as well.  Next had pt roll his wheelchair down to the ADL apartment to practice tub/shower transfers to tub bench.  Pt able to complete with supervision, requiring min instructional cueing to for wheelchair placement as he put the chair too far forward, thus eliminating area on the bench to transfer to.  Pt voiced understanding of wheelchair setup.  Therapist assisted with wheelchair mobility back to the room and pt transferred back to the bed with modified independent scoot transfer.  Call button and phone within reach.  Discussed need for pain meds with pt's nurse and let him know when he was due for next dose.   Therapy Documentation Precautions:  Precautions Precautions: Fall Precaution Comments: L BKA prosthesis Restrictions Weight Bearing Restrictions: Yes RLE Weight Bearing: Non weight bearing  Pain: Pain Assessment Pain Assessment: Faces Pain Score: 5  Faces Pain Scale: Hurts little more Pain Type: Surgical pain Pain Location: Leg Pain Orientation: Right Pain Intervention(s): Repositioned ADL: See Function Navigator for Current Functional Status.   Therapy/Group: Individual Therapy  [name] OTR/L 04/26/2015, 5:00 PM

## 2015-04-26 NOTE — Progress Notes (Signed)
Occupational Therapy Session Note  Patient Details  Name: Steven [name] MRN: [id number] Date of Birth: 1961/05/07  Today's Date: 04/26/2015 OT Individual Time: 1100-1200 OT Individual Time Calculation (min): 60 min    Short Term Goals: Week 1:  OT Short Term Goal 1 (Week 1): LTG=STG  Skilled Therapeutic Interventions/Progress Updates:    Pt engaged in BADL retraining including bathing at shower level and dressing sitting EOB.  Pt required more than a reasonable amount of time to complete all tasks at a mod I level.  Pt exhibited appropriate problem solving strategies for shower transfer and safety awareness.  Focus on discharge planning, safety awareness, w/c mobility, transfers, and sitting balance to increased independence with BADLs.  Therapy Documentation Precautions:  Precautions Precautions: Fall Precaution Comments: prostheses are at the hospital, but not the inserts Restrictions Weight Bearing Restrictions: Yes RLE Weight Bearing: Non weight bearing  Pain: Pain Assessment Pain Assessment: 0-10 Pain Score: 8  Pain Type: Surgical pain Pain Location: Leg Pain Orientation: Right Pain Descriptors / Indicators: Sore;Tender Pain Onset: On-going Pain Intervention(s): Repositioned ADL: ADL ADL Comments: see function navigator  See Function Navigator for Current Functional Status.   Therapy/Group: Individual Therapy  Leroy Libman 04/26/2015, 1:41 PM

## 2015-04-26 NOTE — Plan of Care (Signed)
Problem: RH PAIN MANAGEMENT Goal: RH STG PAIN MANAGED AT OR BELOW PT'S PAIN GOAL Pain level at or below 4  Outcome: Not Progressing Rates pain as 10

## 2015-04-27 ENCOUNTER — Ambulatory Visit: Payer: Medicaid Other | Admitting: Internal Medicine

## 2015-04-27 ENCOUNTER — Inpatient Hospital Stay (HOSPITAL_COMMUNITY): Payer: Medicaid Other | Admitting: Occupational Therapy

## 2015-04-27 ENCOUNTER — Inpatient Hospital Stay (HOSPITAL_COMMUNITY): Payer: Medicaid Other | Admitting: Physical Therapy

## 2015-04-27 LAB — GLUCOSE, CAPILLARY
GLUCOSE-CAPILLARY: 120 mg/dL — AB (ref 65–99)
Glucose-Capillary: 98 mg/dL (ref 65–99)

## 2015-04-27 MED ORDER — RIVAROXABAN 20 MG PO TABS: 20.0000 mg | ORAL_TABLET | Freq: Every day | ORAL | Status: DC

## 2015-04-27 MED ORDER — TRAMADOL HCL 50 MG PO TABS: 50.0000 mg | ORAL_TABLET | Freq: Three times a day (TID) | ORAL | Status: DC

## 2015-04-27 MED ORDER — METHOCARBAMOL 500 MG PO TABS: 500.0000 mg | ORAL_TABLET | Freq: Four times a day (QID) | ORAL | Status: DC | PRN

## 2015-04-27 MED ORDER — THIAMINE HCL 100 MG PO TABS: 100.0000 mg | ORAL_TABLET | Freq: Every day | ORAL | Status: DC

## 2015-04-27 MED ORDER — GABAPENTIN 400 MG PO CAPS: 800.0000 mg | ORAL_CAPSULE | Freq: Three times a day (TID) | ORAL | Status: DC

## 2015-04-27 MED ORDER — ADULT MULTIVITAMIN W/MINERALS CH: 1.0000 | ORAL_TABLET | Freq: Every day | ORAL | Status: AC

## 2015-04-27 MED ORDER — ASPIRIN EC 81 MG PO TBEC: 160.0000 mg | DELAYED_RELEASE_TABLET | Freq: Every day | ORAL | Status: AC

## 2015-04-27 MED ORDER — POLYETHYLENE GLYCOL 3350 17 G PO PACK: 17.0000 g | PACK | Freq: Every day | ORAL | Status: DC | PRN

## 2015-04-27 MED ORDER — ADULT MULTIVITAMIN W/MINERALS CH: 1.0000 | ORAL_TABLET | Freq: Every day | ORAL | Status: DC

## 2015-04-27 MED ORDER — OXYCODONE HCL 10 MG PO TABS: 5.0000 mg | ORAL_TABLET | Freq: Four times a day (QID) | ORAL | Status: DC | PRN

## 2015-04-27 MED ORDER — POLYSACCHARIDE IRON COMPLEX 150 MG PO CAPS: 150.0000 mg | ORAL_CAPSULE | Freq: Every day | ORAL | Status: DC

## 2015-04-27 NOTE — Plan of Care (Signed)
Problem: RH KNOWLEDGE DEFICIT Goal: RH STG INCREASE KNOWLEDGE OF DIABETES Outcome: Completed/Met Date Met:  04/27/15 Aware, but not always complainant at home

## 2015-04-27 NOTE — Discharge Summary (Signed)
Physician Discharge Summary  Patient ID: Steven [name] MRN: [id number] DOB/AGE: July 06, 1960 54 y.o.  Admit date: 04/19/2015 Discharge date: 04/27/2015  Discharge Diagnoses:  Principal Problem:   Unilateral complete AKA ([place]) Active Problems:   Diabetes mellitus type II, uncontrolled ([place])   Peripheral neuropathy ([place])   Tobacco abuse   History of left below knee amputation ([place])   Adjustment disorder with depressed mood   Discharged Condition:  Stable    Labs:  Basic Metabolic Panel: BMP Latest Ref Rng 04/20/2015 04/15/2015 04/14/2015  Glucose 65 - 99 mg/dL 125(H) 149(H) 247(H)  BUN 6 - 20 mg/dL 12 6 8   Creatinine 0.61 - 1.24 mg/dL 0.87 0.70 0.84  Sodium 135 - 145 mmol/L 137 136 135  Potassium 3.5 - 5.1 mmol/L 4.0 3.4(L) 3.6  Chloride 101 - 111 mmol/L 99(L) 100(L) 100(L)  CO2 22 - 32 mmol/L 30 28 25   Calcium 8.9 - 10.3 mg/dL 9.2 8.9 9.0     CBC: CBC Latest Ref Rng 04/20/2015 04/17/2015 04/14/2015  WBC 4.0 - 10.5 K/uL 8.0 9.6 8.6  Hemoglobin 13.0 - 17.0 g/dL 12.4(L) 12.7(L) 14.6  Hematocrit 39.0 - 52.0 % 37.9(L) 38.7(L) 42.4  Platelets 150 - 400 K/uL 357 291 280     CBG:  Recent Labs Lab 04/26/15 0700 04/26/15 1243 04/26/15 1612 04/26/15 2033 04/27/15 0657  GLUCAP 113* 187* 150* 120* 120*    Brief HPI:   Steven [name] is a 54 y.o. male with history of HTN, PAF- on xarelto, polysubstance abuse--UDS positive for cocaine, DM type 2 with peripheral neuropathy, PAD with B-BKA who was admitted on 04/14/15 with 3 week history of severe pain and coolness of R-BKA site. 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 He has failed conservative care and underwent R-AKA on 11/03 by Dr. Sharol Given. He has had issues with poor pain control and gabapentin was increased to help manage neuropathic pain. PT/OT evaluations were completed and CIR was recommended for follow up therapy.    Hospital Course: Steven [name] was  admitted to rehab 04/19/2015 for inpatient therapies to consist of PT and OT at least three hours five days a week. Past admission physiatrist, therapy team and rehab RN have worked together to provide customized collaborative inpatient rehab. Blood pressures have been well controlled and he has been afebrile during his rehab stay.  Po intake has been good and he is continent of bowel and bladder. Diabetes was monitored with ac/hs cbg checks and blood sugars have been reasonably controlled.   He continued to have severe pain and neuropathy R-AKA site and medications were titrated for better pain control.   Ultram was scheduled qid with slow increase in Neurontin dosing. Neuropsychologist was consulted to evaluate mood. Dr. Beverly Gust felt that patient's irritable mood was pain associated as he did not report any symptoms concerning major depression. Oxycontin was added to help with activity tolerance and this was discontinued by discharge.  R-AKA wound has been healing well without signs or symptoms of infection. Minimal serous drainage has been monitored and wound dressed with dry gauze with compressive dressing. Patient has made steady progress and is modified independent at wheelchair level. He will have follow up by [facility] for wound monitoring after discharge. He has been set with bath aide by [employer].     Rehab course: During patient's stay in rehab weekly team conferences were held to monitor patient's progress, set goals and discuss  barriers to discharge. At admission, patient required min assist with basic self care tasks and mobility. He has had improvement in activity tolerance, balance, postural control, as well as ability to compensate for deficits.  He needs min assist for tub/shower transfers. He is able to complete bathing and dressing with AE at modified independent level.  He is independent for bed to chair transfers and requires supervision with cues for car transfers. Family education not  completed as family unavailable and girlfriend will be checking in after discharge.     Disposition: Home  Diet: Diabetic diet.   Special Instructions: 1. Avoid smoking as this can delay/hinder healing 2. Contact MD if case of increase in swelling, drainage, redness, pain from amputation site or in case of fever/chills.  3. All pain medications to be filled by Dr. Sharol Given or primary MD.     Medication List    STOP taking these medications        nitroGLYCERIN 0.2 mg/hr patch  Commonly known as:  NITRODUR - Dosed in mg/24 hr     oxyCODONE-acetaminophen 5-325 MG tablet  Commonly known as:  PERCOCET      TAKE these medications        acetaminophen 325 MG tablet  Commonly known as:  TYLENOL  Take 2 tablets (650 mg total) by mouth every 6 (six) hours as needed.     aspirin EC 81 MG tablet  Take 2 tablets (162 mg total) by mouth daily.     atorvastatin 40 MG tablet  Commonly known as:  LIPITOR  Take 1 tablet (40 mg total) by mouth daily.     cetirizine 10 MG tablet  Commonly known as:  ZYRTEC  Take 10 mg by mouth daily.     diltiazem 120 MG 24 hr capsule  Commonly known as:  CARDIZEM CD  Take 120 mg by mouth daily.     fluticasone 50 MCG/ACT nasal spray  Commonly known as:  FLONASE  Place 2 sprays into both nostrils daily.     folic acid 1 MG tablet  Commonly known as:  FOLVITE  Take 1 tablet (1 mg total) by mouth daily.     gabapentin 400 MG capsule  Commonly known as:  NEURONTIN  Take 2 capsules (800 mg total) by mouth 3 (three) times daily.     insulin aspart 100 UNIT/ML injection  Commonly known as:  novoLOG  Inject 10-15 Units into the skin daily before supper.     insulin glargine 100 UNIT/ML injection  Commonly known as:  LANTUS  Inject 0.45 mLs (45 Units total) into the skin daily before breakfast.     iron polysaccharides 150 MG capsule  Commonly known as:  NIFEREX  Take 1 capsule (150 mg total) by mouth daily.     lisinopril 20 MG tablet   Commonly known as:  PRINIVIL,ZESTRIL  Take 1 tablet (20 mg total) by mouth daily. For blood pressure     methocarbamol 500 MG tablet  Commonly known as:  ROBAXIN  Take 1 tablet (500 mg total) by mouth every 6 (six) hours as needed for muscle spasms.     multivitamin with minerals Tabs tablet  Take 1 tablet by mouth daily.     nicotine 21 mg/24hr patch  Commonly known as:  NICODERM CQ - dosed in mg/24 hours  Place 1 patch (21 mg total) onto the skin daily.     Oxycodone HCl 10 MG Tabs--#90 pills  Take 0.5-1 tablets (5-10 mg total) by  mouth every 6 (six) hours as needed for severe pain.     pantoprazole 40 MG tablet  Commonly known as:  PROTONIX  Take 1 tablet (40 mg total) by mouth at bedtime.     polyethylene glycol packet  Commonly known as:  MIRALAX / GLYCOLAX  Take 17 g by mouth daily as needed. For constipation     rivaroxaban 20 MG Tabs tablet  Commonly known as:  XARELTO  Take 1 tablet (20 mg total) by mouth daily with supper. For Atrial Fibrillation     thiamine 100 MG tablet  Take 1 tablet (100 mg total) by mouth daily.     traMADol 50 MG tablet--Rx #120 pills   Commonly known as:  ULTRAM  Take 1 tablet (50 mg total) by mouth 4 (four) times daily -  before meals and at bedtime.           Follow-up Information    Follow up with Chrisandra Netters, MD On 05/04/2015.   Specialty:  Family Medicine   Why:  @ 4 PM   Contact information:   [place] [address] [phone]       Follow up with Newt Minion, MD. Call today.   Specialty:  Orthopedic Surgery   Why:  for follow up appointment in 2 weeks and for refills on pain medications.    Contact information:   [place] [address] [id number]       Call Charlett Blake, MD.   Specialty:  Physical Medicine and Rehabilitation   Why:  As needed   Contact information:   [place] [address] [phone]       Signed: Bary Leriche 04/27/2015, 8:59 AM

## 2015-04-27 NOTE — Plan of Care (Signed)
Problem: RH SKIN INTEGRITY Goal: RH STG ABLE TO PERFORM INCISION/WOUND CARE W/ASSISTANCE STG Able To Perform Incision/Wound Care With min Assistance.  Outcome: Completed/Met Date Met:  04/27/15 Pt instructed caregiver on how to care for wound, and how to apply dressing to R AKA

## 2015-04-27 NOTE — Progress Notes (Signed)
Pt discharged home with family friend. Discharge instructions provided by Algis Liming, PA, all questions answered.  Home medication returned, R AKA dressing changed. Pt escorted off unit in w/c with personal belonging by Dixon Boos, NT.

## 2015-04-27 NOTE — Discharge Instructions (Signed)
Inpatient Rehab Discharge Instructions  HAYS GRANUCCI Discharge date and time: 04/27/15   Activities/Precautions/ Functional Status: Activity: activity as tolerated  Diet: cardiac diet Wound Care: Wash with soap and water. Pat dry. Apply compressive dressing/sleeve. No lotions, ointments or creams to the incision.    Functional status:  ___ No restrictions     ___ Walk up steps independently ___ 24/7 supervision/assistance   ___ Walk up steps with assistance _X__ Intermittent supervision/assistance  ___ Bathe/dress independently ___ Walk with walker     ___ Bathe/dress with assistance ___ Walk Independently    ___ Shower independently ___ Walk with assistance    ___ Shower with assistance _X__ No alcohol     ___ Return to work/school ________  COMMUNITY REFERRALS UPON DISCHARGE:   Home Health:   RN   Agency:  Central Phone: [phone] Outpatient: Dr. Sharol Given will see you for follow up in his office and will refer you to outpatient rehabilitation when it is time for you to begin prosthetic training again. Medical Equipment/Items Ordered:  Hospital bed for home use  Agency/Supplier:  [employer]        Phone: [phone] Other:  [facility] [phone] to coordinate [facility]).  Most likely, you will see the aide soon, even before [facility]'s nurse comes to assess you because you have had assessments in the home before with them.  Aide will be coming from [facility].  Phone:  [phone]  GENERAL COMMUNITY RESOURCES FOR PATIENT/FAMILY: Support Groups:  [place] [facility]                               1st Tuesday of every month at 7-8:30 pm                                 [facility]                                   [phone]  Special Instructions: 1. No smoking. This will delay/prevent the amputation site from healing. 2. Wean pain medications to every 6 hours for about 5 days.  Then decrease further to every 8 hours as needed and continue to wean to every 12 hours as needed.  3. Nicotine patch:  Use 21 mg daily for 3 weeks. Then decrease to 14 mg daily for 4 weeks. Then taper to 7 mg daily for 4 weeks. Then stop.  4. Contact Dr. Sharol Given if you notice any redness, warmth, purulent or bloody drainage, increase in pain or fever/chills.     My questions have been answered and I understand these instructions. I will adhere to these goals and the provided educational materials after my discharge from the hospital.  Patient/Caregiver Signature _______________________________ Date __________  Clinician Signature _______________________________________ Date __________  Please bring this form and your medication list with you to all your follow-up doctor's appointments.

## 2015-04-27 NOTE — Progress Notes (Signed)
Subjective/Complaints: Complaining that pain medicine was given too late this morning. ROS:   Pt without SOB, CP,no N/V/D Objective: Vital Signs: Blood pressure 132/78, pulse 84, temperature 97.9 F (36.6 C), temperature source Oral, resp. rate 18, height 6\' 4"  (1.93 m), weight 91.581 kg (201 lb 14.4 oz), SpO2 98 %. No results found. Results for orders placed or performed during the hospital encounter of 04/19/15 (from the past 72 hour(s))  Glucose, capillary     Status: Abnormal   Collection Time: 04/24/15 11:28 AM  Result Value Ref Range   Glucose-Capillary 160 (H) 65 - 99 mg/dL   Comment 1 Notify RN   Glucose, capillary     Status: Abnormal   Collection Time: 04/24/15  8:41 PM  Result Value Ref Range   Glucose-Capillary 105 (H) 65 - 99 mg/dL   Comment 1 Notify RN   Glucose, capillary     Status: Abnormal   Collection Time: 04/25/15  6:32 AM  Result Value Ref Range   Glucose-Capillary 127 (H) 65 - 99 mg/dL   Comment 1 Notify RN   Glucose, capillary     Status: Abnormal   Collection Time: 04/25/15 11:40 AM  Result Value Ref Range   Glucose-Capillary 135 (H) 65 - 99 mg/dL   Comment 1 Notify RN   Glucose, capillary     Status: Abnormal   Collection Time: 04/25/15  4:31 PM  Result Value Ref Range   Glucose-Capillary 106 (H) 65 - 99 mg/dL   Comment 1 Notify RN   Glucose, capillary     Status: Abnormal   Collection Time: 04/25/15  9:26 PM  Result Value Ref Range   Glucose-Capillary 158 (H) 65 - 99 mg/dL   Comment 1 Notify RN   Glucose, capillary     Status: Abnormal   Collection Time: 04/26/15  7:00 AM  Result Value Ref Range   Glucose-Capillary 113 (H) 65 - 99 mg/dL   Comment 1 Notify RN   Glucose, capillary     Status: Abnormal   Collection Time: 04/26/15 12:43 PM  Result Value Ref Range   Glucose-Capillary 187 (H) 65 - 99 mg/dL   Comment 1 Notify RN   Glucose, capillary     Status: Abnormal   Collection Time: 04/26/15  4:12 PM  Result Value Ref Range   Glucose-Capillary 150 (H) 65 - 99 mg/dL   Comment 1 Notify RN   Glucose, capillary     Status: Abnormal   Collection Time: 04/26/15  8:33 PM  Result Value Ref Range   Glucose-Capillary 120 (H) 65 - 99 mg/dL   Comment 1 Notify RN   Glucose, capillary     Status: Abnormal   Collection Time: 04/27/15  6:57 AM  Result Value Ref Range   Glucose-Capillary 120 (H) 65 - 99 mg/dL   Comment 1 Notify RN       Extremity:  Pulses positive and Right distal stump Edema Skin:   Wound dried Serosanguineous drainage on dressing, no maceration around incision midpoint. Staples intact Neuro: Alert/Oriented, Agitated and Abnormal Motor Normal motor in BUE and decreased R HF due to pain, Left HF 4/5 Musc/Skel:  Extremity tender Right stump, Small amount of medial incision serous active drainage, indentation mid stump, left stump non tender, Overall stump edema is improved Gen NAD   Assessment/Plan: 1. Functional deficits secondary to Right AKA  Stable for discharge home today. Follow up with orthopedics for suture and staple removal  FIM: Function - Bathing Bathing activity did  not occur:  (nursing assisted last night) Position: Shower Body parts bathed by patient: Right arm, Left arm, Chest, Abdomen, Front perineal area, Buttocks, Right upper leg, Left upper leg Body parts bathed by helper: Front perineal area, Buttocks, Right upper leg, Left upper leg Bathing not applicable: Right lower leg, Left lower leg Assist Level: More than reasonable time, Assistive device  Function- Upper Body Dressing/Undressing What is the patient wearing?: Pull over shirt/dress Pull over shirt/dress - Perfomed by patient: Thread/unthread right sleeve, Thread/unthread left sleeve, Put head through opening, Pull shirt over trunk Assist Level: No help, No cues Set up : To obtain clothing/put away Function - Lower Body Dressing/Undressing What is the patient wearing?: Pants Position: Sitting EOB Underwear - Performed by  patient: Thread/unthread right underwear leg, Thread/unthread left underwear leg, Pull underwear up/down Pants- Performed by patient: Thread/unthread right pants leg, Thread/unthread left pants leg, Pull pants up/down Pants- Performed by helper: Pull pants up/down Non-skid slipper socks- Performed by patient:  (bilateral amputee) Assist for footwear: Supervision/touching assist Assist for lower body dressing: More than reasonable time Set up : To obtain clothing/put away  Function - Toileting Toileting steps completed by patient: Adjust clothing after toileting, Adjust clothing prior to toileting, Performs perineal hygiene Toileting steps completed by helper: Adjust clothing prior to toileting, Performs perineal hygiene, Adjust clothing after toileting Toileting Assistive Devices: Grab bar or rail Assist level: More than reasonable time  Function - Toilet Transfers Toilet transfer assistive device: Grab bar Assist level to toilet: No Help, no cues, assistive device, takes more than a reasonable amount of time Assist level from toilet: No Help, no cues, assistive device, takes more than a reasonable amount of time Assist level to bedside commode (at bedside): 2 helpers (per [employer], NT) Assist level from bedside commode (at bedside): 2 helpers  Function - Chair/bed transfer Chair/bed transfer method: Lateral scoot, Squat pivot Chair/bed transfer assist level: No Help, no cues, assistive device, takes more than a reasonable amount of time Chair/bed transfer assistive device: Armrests Chair/bed transfer details: Visual cues/gestures for precautions/safety  Function - Locomotion: Wheelchair Will patient use wheelchair at discharge?: Yes Type: Manual Max wheelchair distance: 200 Assist Level: No help, No cues, assistive device, takes more than reasonable amount of time Assist Level: No help, No cues, assistive device, takes more than reasonable amount of time Assist Level: No help,  No cues, assistive device, takes more than reasonable amount of time Turns around,maneuvers to table,bed, and toilet,negotiates 3% grade,maneuvers on rugs and over doorsills: Yes Function - Locomotion: Ambulation Ambulation activity did not occur: Refused Assistive device: Parallel bars, Prothesis Max distance: 10 Assist level: Touching or steadying assistance (Pt > 75%) Walk 10 feet activity did not occur: Safety/medical concerns Assist level: Touching or steadying assistance (Pt > 75%) Walk 50 feet with 2 turns activity did not occur: Safety/medical concerns Walk 150 feet activity did not occur: Safety/medical concerns Walk 10 feet on uneven surfaces activity did not occur: Safety/medical concerns  Function - Comprehension Comprehension: Auditory Comprehension assist level: Follows complex conversation/direction with no assist  Function - Expression Expression: Verbal Expression assist level: Expresses complex 90% of the time/cues < 10% of the time  Function - Social Interaction Social Interaction assist level: Interacts appropriately with others with medication or extra time (anti-anxiety, antidepressant).  Function - Problem Solving Problem solving assist level: Solves basic problems with no assist  Function - Memory Memory assist level: More than reasonable amount of time Patient normally able to recall (first 3  days only): Current season, Location of own room, Staff names and faces, That he or she is in a hospital  Medical Problem List and Plan: 1. 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  DVT Prophylaxis/Anticoagulation: Pharmaceutical: Xarelto 3. Pain Management:  Continue Oxycodone prn.  Discontinue OxyContin. 2 week supply of oxycodone with ortho f/u 4. Mood: LCSW to follow for evaluation and support.   5. Neuropsych: This patient is capable of making decisions on his own behalf. 6. Skin/Wound Care: Routine pressure relief measures.  Maintain adequate nutrition and hydration status. Add protein supplement due to low protein stores.   7. Fluids/Electrolytes/Nutrition: Monitor I/O. Check lytes in am.   8. DM type 2: Poorly controlled due to non-compliance with lantus. Discussed importance of medication compliance to help with wound healing. Will monitor BS ac/hs. Continue Lantus daily with meal coverage.  increase lantus 9. HTN: Monitor BP bid--poorly controlled at this time and could be pain mediated. Continue Cardizem and lisinopril daily. Titrate medications as indicated.   10. PAF: Monitor HR bid and for any symptoms with activity. On cardizem CD daily No BB , rate controlled at 87bpm,  May have been due to cocaine use. 11. ABLA: Add iron supplement. hgb mildly reduced 12.4  12. Constipation: Increase miralax to bid.   13. Hypokalemia: resolved   LOS (Days) 8 A FACE TO FACE EVALUATION WAS PERFORMED  Cheyeanne Roadcap E 04/27/2015, 8:57 AM

## 2015-04-27 NOTE — Progress Notes (Signed)
Social Work Discharge Note  The overall goal for the admission was met for:   Discharge location: Yes - home  Length of Stay: Yes - 8 days  Discharge activity level: Yes - Modified Independent  Home/community participation: Yes  Services provided included: MD, RD, PT, OT, RN, Pharmacy, Neuropsych and SW  Financial Services: Medicaid  Follow-up services arranged: Home Health: RN, DME: hospital bed and Patient/Family request agency HH: [facility], DME: [facility]  Comments (or additional information):  Pt d/c'd home with his girlfriend and will have [place] through [facility] from [facility] and RN from [place].  Pt will also have a hospital bed from [facility].  CSW asked for [employer] to come to his home to assess his electric w/c he received from them about a year and a half ago, as he said the seat is wearing out.  Sister and girlfriend will assist pt as needed.  Patient/Family verbalized understanding of follow-up arrangements: Yes  Individual responsible for coordination of the follow-up plan:  Pt with support from his sister and girlfriend, as needed.  Confirmed correct DME delivered: Trey Sailors 04/27/2015    Srija Southard, Silvestre Mesi

## 2015-04-27 NOTE — Plan of Care (Signed)
Problem: RH PAIN MANAGEMENT Goal: RH STG PAIN MANAGED AT OR BELOW PT'S PAIN GOAL Pain level at or below 4  Outcome: Not Met (add Reason) Rates pain greater than 4

## 2015-04-29 DIAGNOSIS — I480 Paroxysmal atrial fibrillation: Secondary | ICD-10-CM | POA: Insufficient documentation

## 2015-05-04 ENCOUNTER — Ambulatory Visit (INDEPENDENT_AMBULATORY_CARE_PROVIDER_SITE_OTHER): Payer: Medicaid Other | Admitting: Family Medicine

## 2015-05-04 ENCOUNTER — Encounter: Payer: Self-pay | Admitting: Family Medicine

## 2015-05-04 VITALS — BP 122/73 | HR 97 | Temp 98.7°F

## 2015-05-04 DIAGNOSIS — Z794 Long term (current) use of insulin: Secondary | ICD-10-CM

## 2015-05-04 DIAGNOSIS — Z89611 Acquired absence of right leg above knee: Secondary | ICD-10-CM

## 2015-05-04 DIAGNOSIS — E1152 Type 2 diabetes mellitus with diabetic peripheral angiopathy with gangrene: Secondary | ICD-10-CM

## 2015-05-04 DIAGNOSIS — I480 Paroxysmal atrial fibrillation: Secondary | ICD-10-CM

## 2015-05-04 DIAGNOSIS — E1165 Type 2 diabetes mellitus with hyperglycemia: Secondary | ICD-10-CM | POA: Diagnosis not present

## 2015-05-04 DIAGNOSIS — IMO0002 Reserved for concepts with insufficient information to code with codable children: Secondary | ICD-10-CM

## 2015-05-04 DIAGNOSIS — S78111A Complete traumatic amputation at level between right hip and knee, initial encounter: Secondary | ICD-10-CM

## 2015-05-04 MED ORDER — RIVAROXABAN 20 MG PO TABS: 20.0000 mg | ORAL_TABLET | Freq: Every day | ORAL | Status: DC

## 2015-05-04 MED ORDER — DILTIAZEM HCL ER COATED BEADS 120 MG PO CP24: 120.0000 mg | ORAL_CAPSULE | Freq: Every day | ORAL | Status: DC

## 2015-05-04 MED ORDER — CETIRIZINE HCL 10 MG PO TABS: 10.0000 mg | ORAL_TABLET | Freq: Every day | ORAL | Status: DC

## 2015-05-04 NOTE — Patient Instructions (Signed)
Refilled medicine: diltiazem, xarelto, zyrtec  Follow up with me in 1 month. Bring the sugar chart with you to that visit  Be well, Dr. [name]

## 2015-05-04 NOTE — Progress Notes (Signed)
  HPI:  Steven [name] presents for hospital follow up. Patient was hospitalized from 11/2 to 11/15 with gangrene of R BKA. Underwent R AKA and subsequent stay in inpatient rehab.  Since discharge patient has been doing well at home. Still has plenty of pain medications left in bottles (oxycodone and tramadol).  Has not had anyone come out to his house yet but can call the [facility] RN to get that set up. Wound looks good to him. Just changed dressing yesterday. Has not yet scheduled follow up with Dr. Sharol Given.   Does reqeuest refills on: -zyrtec  (reports this helps is allergies) -xarelto (takes for afib. Denies any palpitations. No bleeding issues). -diltiazem (also for afib)  diabetes - currently taking lantus 45 units daily. No low sugars. Unable to tell me what his sugars have run recently. Denies CP or shortness of breath.   ROS: See HPI.  Steven [name]: history of afib, diabetes, diabetic retinopathy, hyperlipidemia, L BKA with R AKA, hypertension, hyperlipidemia, PAD, tobacco abuse, cocaine abuse  PHYSICAL EXAM: BP 122/73 mmHg  Pulse 97  Temp(Src) 98.7 F (37.1 C) (Oral)  Wt  Gen: NAD, pleasant, cooperative, in wheelchair HEENT: NCAT Heart: regular rate and rhythm no murmur Lungs: clear to auscultation bilaterally NWOB Neuro: grossly nonfocal speech normal Ext: R AKA stump dressing is clean and dry. Did not unwrap.   ASSESSMENT/PLAN:  Paroxysmal a-fib (HCC) Refilled xarelto and diltiazem today. Currently rate controlled.   Unilateral complete AKA ([name]) Doing well after discharge. Encouraged him to call [facility] RN to get set up for wound care visits. Also encouraged patient to call ortho office to schedule follow up visit.   Diabetes mellitus type II, uncontrolled (Steven [name]) Unable to titrate lantus as patient did not bring sugar record with him. Printed CBG chart and gave to patient. Follow up in 1 month.   FOLLOW UP: F/u in 1 month for diabetes  [place] J. [name], Steven [name]

## 2015-05-08 NOTE — Assessment & Plan Note (Signed)
Unable to titrate lantus as patient did not bring sugar record with him. Printed CBG chart and gave to patient. Follow up in 1 month.

## 2015-05-08 NOTE — Assessment & Plan Note (Signed)
Doing well after discharge. Encouraged him to call [facility] RN to get set up for wound care visits. Also encouraged patient to call ortho office to schedule follow up visit.

## 2015-05-08 NOTE — Assessment & Plan Note (Addendum)
Refilled xarelto and diltiazem today. Currently rate controlled.

## 2015-05-13 DIAGNOSIS — Z89611 Acquired absence of right leg above knee: Secondary | ICD-10-CM | POA: Diagnosis not present

## 2015-05-13 DIAGNOSIS — E1151 Type 2 diabetes mellitus with diabetic peripheral angiopathy without gangrene: Secondary | ICD-10-CM | POA: Diagnosis not present

## 2015-05-13 DIAGNOSIS — E1165 Type 2 diabetes mellitus with hyperglycemia: Secondary | ICD-10-CM | POA: Diagnosis not present

## 2015-05-22 ENCOUNTER — Other Ambulatory Visit: Payer: Self-pay | Admitting: Family Medicine

## 2015-05-24 ENCOUNTER — Inpatient Hospital Stay: Payer: Medicaid Other | Admitting: Physical Medicine & Rehabilitation

## 2015-05-31 ENCOUNTER — Telehealth: Payer: Self-pay | Admitting: Family Medicine

## 2015-05-31 NOTE — Telephone Encounter (Signed)
Cheree Ditto, a nurse care manager for [facility], called to report that the patient has refused home health care and was discharged from care on 05/14/2015. She wants the provider to know that his wound dressing needs to be checked from his amputation. Thank you, Fonda Kinder, ASA

## 2015-06-03 ENCOUNTER — Ambulatory Visit (INDEPENDENT_AMBULATORY_CARE_PROVIDER_SITE_OTHER): Payer: Medicaid Other | Admitting: Family Medicine

## 2015-06-03 ENCOUNTER — Encounter: Payer: Self-pay | Admitting: Family Medicine

## 2015-06-03 VITALS — BP 165/98 | HR 100 | Ht 76.0 in

## 2015-06-03 DIAGNOSIS — Z89611 Acquired absence of right leg above knee: Secondary | ICD-10-CM

## 2015-06-03 DIAGNOSIS — Z596 Low income: Secondary | ICD-10-CM

## 2015-06-03 DIAGNOSIS — S78111A Complete traumatic amputation at level between right hip and knee, initial encounter: Secondary | ICD-10-CM

## 2015-06-03 MED ORDER — METHOCARBAMOL 500 MG PO TABS: 500.0000 mg | ORAL_TABLET | Freq: Four times a day (QID) | ORAL | Status: DC | PRN

## 2015-06-03 NOTE — Patient Instructions (Signed)
Follow up with me in 2-3 weeks. Bring the blood sugar chart with you. We went through your medicines today and identified the most important ones for you to pay for. I am referring you to a pain doctor for your pain. You will get a phone call to schedule this appointment.  It's okay to stop wrapping the leg now. If the scab falls off and it starts draining, wrap it then. Call with any questions or concerns  Be well, Dr. [name]

## 2015-06-04 DIAGNOSIS — Z596 Low income: Secondary | ICD-10-CM | POA: Insufficient documentation

## 2015-06-04 NOTE — Assessment & Plan Note (Signed)
Tough situation. We went through his list of 21 medications together, and classified them in terms of priority to help him best utilize his financial resources.  -Top priority (medically important): aspirin, lipitor, diltiazem, insulin aspart, insulin glargine, lisinopril, xarelto -Medium priority (important for comfort): tylenol, gabapentin, robaxin -Lower priority: zyrtec, flonase, folic acid, iron, multivitamin, nicotine patch, oxycodone (low as I am not rx ing this), protonix, miralax (uses rarely), thiamine, tramadol (will not rx today, see above).  Patient and caregiver appreciative of this classification.

## 2015-06-04 NOTE — Assessment & Plan Note (Signed)
Well healing. Advised can stop dressing the incision as long as it continues to not drain.   Asked friend to step out of room and had frank discussion with patient today about why I am unable to prescribe him pain medications. Every urine drug screen he has had in the hospital has been positive for cocaine. Advised of unsafe situation when mixing opioids with cocaine. Patient endorses using cocaine as recently as yesterday. He understood this is a barrier and will prevent him from receiving narcotic prescriptions from [place] at our clinic.  He is agreeable to pain management referral. Will enter this. Counseled him that they will rigorously screen him for drug use at pain clinic, and that they will also not give him narcotics if he tests positive. He understood.  In the interim, will refill robaxin for muscle spasm. Also recommended trying ice or heating pad. He will continue on gabapentin.

## 2015-06-04 NOTE — Progress Notes (Signed)
Date of Visit: 06/03/2015   HPI:  Patient presents to discuss pain and being unable to afford medications.  He is accompanied by a male friend who seems to also help care for him.  R stump - healing well. Getting [facility] wound care (despite telephone note from a few days ago). No drainage presently. Keeping it covered. Has lots of pain in this area though. Some phantom limb pain. Requests pain medicine and muscle relaxers. Is out of oxycodone and tramadol.  Medication issues - cannot afford his medicine. Has medicaid and is thus ineligible for financial assistance through [facility] or the health dept. Caregiver/friend brought in list of his medicines, 21 in all. Has family members trying to help but they are all on fixed incomes.   Also attempted to discuss diabetes with patient today but he did not bring his blood sugar chart and could not reliably recall his CBG readings.   ROS: See HPI.  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 Sitting in manual wheelchair HEENT: NCAT Lungs: normal work of breathing.  Neuro: alert, grossly nonfocal, speech normal Ext: R BKA site healing well without fluctuance, erythema, or tenderness to palpation. Mild scabbing on part of incision, rest well healed. No drainage.   ASSESSMENT/PLAN:  Unilateral complete AKA (HCC) Well healing. Advised can stop dressing the incision as long as it continues to not drain.   Asked friend to step out of room and had frank discussion with patient today about why I am unable to prescribe him pain medications. Every urine drug screen he has had in the hospital has been positive for cocaine. Advised of unsafe situation when mixing opioids with cocaine. Patient endorses using cocaine as recently as yesterday. He  understood this is a barrier and will prevent him from receiving narcotic prescriptions from [place] at our clinic.  He is agreeable to pain management referral. Will enter this. Counseled him that they will rigorously screen him for drug use at pain clinic, and that they will also not give him narcotics if he tests positive. He understood.  In the interim, will refill robaxin for muscle spasm. Also recommended trying ice or heating pad. He will continue on gabapentin.  Patient cannot afford medications Tough situation. We went through his list of 21 medications together, and classified them in terms of priority to help him best utilize his financial resources.  -Top priority (medically important): aspirin, lipitor, diltiazem, insulin aspart, insulin glargine, lisinopril, xarelto -Medium priority (important for comfort): tylenol, gabapentin, robaxin -Lower priority: zyrtec, flonase, folic acid, iron, multivitamin, nicotine patch, oxycodone (low as I am not rx ing this), protonix, miralax (uses rarely), thiamine, tramadol (will not rx today, see above).  Patient and caregiver appreciative of this classification.   Diabetes - printed new blood sugar chart for patient to complete & bring with him at follow up in several weeks.   FOLLOW UP: F/u in 2-3 weeks for diabetes  [place] J. [name], [place]

## 2015-06-22 ENCOUNTER — Telehealth: Payer: Self-pay | Admitting: Family Medicine

## 2015-06-22 NOTE — Telephone Encounter (Signed)
[facility] called because the patient is not being compliant on his medications. He is not picking them at the pharmacy and has refused home health. The patient is not taking his Xarelto, Robaxin, and has not tested his blood sugar in a while since he has not picked up any prescriptions since June or July of this year. She just wanted the doctor to be aware that the patient is not being compliant. jw

## 2015-06-23 NOTE — Telephone Encounter (Signed)
Noted. Patient has issues paying for medications, this was the focus of the entirety of my recent visit with him. Will discuss with him when he returns for follow up. Leeanne Rio, MD

## 2015-06-28 ENCOUNTER — Ambulatory Visit (INDEPENDENT_AMBULATORY_CARE_PROVIDER_SITE_OTHER): Payer: Medicaid Other | Admitting: Internal Medicine

## 2015-06-28 ENCOUNTER — Encounter: Payer: Self-pay | Admitting: Internal Medicine

## 2015-06-28 VITALS — BP 120/78 | HR 100 | Temp 98.5°F

## 2015-06-28 DIAGNOSIS — E118 Type 2 diabetes mellitus with unspecified complications: Secondary | ICD-10-CM

## 2015-06-28 DIAGNOSIS — D649 Anemia, unspecified: Secondary | ICD-10-CM | POA: Diagnosis not present

## 2015-06-28 DIAGNOSIS — M79604 Pain in right leg: Secondary | ICD-10-CM | POA: Diagnosis not present

## 2015-06-28 DIAGNOSIS — Z596 Low income: Secondary | ICD-10-CM | POA: Diagnosis not present

## 2015-06-28 DIAGNOSIS — M79642 Pain in left hand: Secondary | ICD-10-CM

## 2015-06-28 DIAGNOSIS — M79641 Pain in right hand: Secondary | ICD-10-CM | POA: Diagnosis not present

## 2015-06-28 DIAGNOSIS — E1165 Type 2 diabetes mellitus with hyperglycemia: Secondary | ICD-10-CM | POA: Diagnosis not present

## 2015-06-28 DIAGNOSIS — Z794 Long term (current) use of insulin: Secondary | ICD-10-CM

## 2015-06-28 DIAGNOSIS — IMO0002 Reserved for concepts with insufficient information to code with codable children: Secondary | ICD-10-CM

## 2015-06-28 DIAGNOSIS — I480 Paroxysmal atrial fibrillation: Secondary | ICD-10-CM

## 2015-06-28 MED ORDER — INSULIN ASPART 100 UNIT/ML ~~LOC~~ SOLN: SUBCUTANEOUS | Status: DC

## 2015-06-28 MED ORDER — ACETAMINOPHEN 325 MG PO TABS: 650.0000 mg | ORAL_TABLET | Freq: Four times a day (QID) | ORAL | Status: AC | PRN

## 2015-06-28 MED ORDER — METHOCARBAMOL 500 MG PO TABS: 500.0000 mg | ORAL_TABLET | Freq: Four times a day (QID) | ORAL | Status: DC | PRN

## 2015-06-28 MED ORDER — POLYSACCHARIDE IRON COMPLEX 150 MG PO CAPS: 150.0000 mg | ORAL_CAPSULE | Freq: Every day | ORAL | Status: DC

## 2015-06-28 MED ORDER — RIVAROXABAN 20 MG PO TABS: 20.0000 mg | ORAL_TABLET | Freq: Every day | ORAL | Status: DC

## 2015-06-28 MED ORDER — ACETAMINOPHEN 325 MG PO TABS: 650.0000 mg | ORAL_TABLET | Freq: Four times a day (QID) | ORAL | Status: DC | PRN

## 2015-06-28 MED ORDER — INSULIN GLARGINE 100 UNIT/ML ~~LOC~~ SOLN: SUBCUTANEOUS | Status: DC

## 2015-06-28 NOTE — Patient Instructions (Signed)
Mr. Govoni,  Thank you for coming in and keeping such a great log of your blood sugars!  Please start taking the short-acting, mealtime insulin (novolog) with breakfast, lunch and dinner and continue to take the long-acting insulin (lantus) at night. If you can continue to keep a log that would be very helpful. Please see [place] back in about a month to review your sugars and check your blood pressure.  I have refilled your muscle relaxant, tylenol, bloodthinner xarelto, insulin and iron pills.  Thank you, Dr. Ola Spurr

## 2015-06-28 NOTE — Progress Notes (Signed)
Subjective:    Patient ID: Steven [name], male    DOB: Jan 24, 1961, 55 y.o.   MRN: [id number]  PMH: Poorly controlled T2DM, Right BKA performed by Dr. Erlinda [name] 12/04/2013 and left BKA 12/29/2013 with R AKA performed 04/15/2015 (all for dry gangrene), HTN, paroxysmal afib, tobacco and cocaine abuse  HPI Patient here to follow-up diabetes, R leg pain, and access to medications after visit 06/03/15.   T2DM: - Hgb A1c 11.2 04/14/15 - Patient brought blood sugar log with over 30 days of sugars taken at breakfast, lunch and dinner, which showed a trend of high lunchtime sugars, mostly in the low to mid 200s. However, numerous measurements are the same, raising question of validity.  - States he takes Lantus 45 units nightly before bed (was instructed to take it in the a.m. by Dr. Valentina [name] but does not like to start his day with an injection) and 10 units of novolog at lunchtime (instructed to take short-acting with largest meal of day). Says he is adherent to medications but would like refills of insulin, so he doesn't run out.  Pain s/p R AKA: - Describes pain as deep and sore. Has cramping of his inner thigh.  - Muscle relaxant robaxin helps that he was prescribed at last visit. - Concerned that he needs an antibiotic in case of infection.  - Denies n/v/d of fevers.   Medication Access: - Patient says sister went to pick up his medications at the pharmacy, and they weren't there. - Patient brought in several of his medication bottles. Diltiazem bottle had pills and has 3 refills. Xarelto bottle was empty with no refills. Lisinopril bottle had pills and had 2 refills. Robaxin was empty with no refills, but patient says he has about 20 pills still at home. Zyrtec bottle was full and had 3 refills. He had 4 iron tablets left and said he needed refills. He also mentioned needing refills of insulin and tylenol.  - Patient did not know why he was on xarelto and thought diltiazem was his blood pressure drug.     Review of Systems  Constitutional: Negative for fever and chills.  HENT: Positive for congestion.   Respiratory: Negative for cough.   Gastrointestinal: Negative for nausea, vomiting and diarrhea.   Social: Current smoker    Objective: Blood pressure 120/78, pulse 100, temperature 98.5 F (36.9 C), temperature source Oral.   Physical Exam  Constitutional: He is oriented to person, place, and time. He appears well-developed and well-nourished. No distress.  HENT:  Head: Normocephalic and atraumatic.  Cardiovascular: Normal rate, regular rhythm and normal heart sounds.  Exam reveals no gallop and no friction rub.   No murmur heard. Pulmonary/Chest: Effort normal and breath sounds normal. No respiratory distress. He has no wheezes. He has no rales.  Musculoskeletal:  Right AKA without erythema, increased warmth or TTP. No skin breakdown noted.   Neurological: He is alert and oriented to person, place, and time.      Assessment & Plan:  Mr. Steven [name] is a 55 y.o. male with poorly controlled T2DM s/p R AKA and L BKA with high mid-day sugars on current insulin regimen, according to patient's CBG log. R AKA healing well; no systemic signs of infection.   Asked patient to return in 1 month to follow-up diabetes.   Diabetes mellitus type II, uncontrolled ([place]) - Recommended that patient take 10 units of novolog with each meal, along with his nighttime insulin. - Asked patient to keep a blood sugar  log and provided form.  - Patient has had trouble with adherence in the past, so if this regimen is not successful at follow-up, may consider increasing and splitting lantus to reduce number of daily injections.   Patient cannot afford medications - Re-emphasized importance of taking xarelto and diltiazem for a-fib.  - Refilled muscle relaxant, tylenol, xarelto, insulin, and iron pills but stated his BP, afib and diabetes medications were most important for his long-term health.   Right leg  pain - Refilled Robaxin.   Steven Floss, MD [facility], PGY-1

## 2015-06-29 NOTE — Assessment & Plan Note (Signed)
Refilled Robaxin 

## 2015-06-29 NOTE — Assessment & Plan Note (Signed)
-   Re-emphasized importance of taking xarelto and diltiazem for a-fib.  - Refilled muscle relaxant, tylenol, xarelto, insulin, and iron pills but stated his BP, afib and diabetes medications were most important for his long-term health.

## 2015-06-29 NOTE — Assessment & Plan Note (Signed)
-   Recommended that patient take 10 units of novolog with each meal, along with his nighttime insulin. - Asked patient to keep a blood sugar log and provided form.  - Patient has had trouble with adherence in the past, so if this regimen is not successful at follow-up, may consider increasing and splitting lantus to reduce number of daily injections.

## 2015-07-28 ENCOUNTER — Telehealth: Payer: Self-pay | Admitting: Family Medicine

## 2015-07-28 NOTE — Telephone Encounter (Signed)
[facility] called because he has an open wound on his leg that he is changing himself. The patient is also not picking up his prescriptions and taking his medication. [facility] is concerned that both of the patient's legs which might be gangrene again. She also wants to see if the doctor can talk to him about some skilled nursing or help with his medication. jw

## 2015-07-28 NOTE — Telephone Encounter (Signed)
Spoke with patient, he states that he is seeing Dr. Sharol Given for this wound and that he just saw him yesterday and it is not that bad and he does not need to go to ED. Patient states he has had trouble affording medications but has what he needs right now. He states he has follow up appointment scheduled with Dr. Sharol Given for dressing change as well. Patient states he has had trouble affording medications but has what he needs right now.. Patient also states he has to reschedule his follow up appointment with PCP due to transportation issues, appointment changed to 2/27. FYI to PCP

## 2015-07-28 NOTE — Telephone Encounter (Signed)
If patient might have gangrene of both legs he needs to be seen in clinic or the ED ASAP. Please call patient and ask him to come in for appointment or go to ED.  Leeanne Rio, MD

## 2015-07-30 ENCOUNTER — Other Ambulatory Visit: Payer: Self-pay | Admitting: *Deleted

## 2015-07-30 ENCOUNTER — Ambulatory Visit: Payer: Medicaid Other | Admitting: Family Medicine

## 2015-07-30 DIAGNOSIS — M79604 Pain in right leg: Secondary | ICD-10-CM

## 2015-07-30 MED ORDER — METHOCARBAMOL 500 MG PO TABS: 500.0000 mg | ORAL_TABLET | Freq: Four times a day (QID) | ORAL | Status: DC | PRN

## 2015-08-02 ENCOUNTER — Other Ambulatory Visit: Payer: Self-pay | Admitting: *Deleted

## 2015-08-02 NOTE — Telephone Encounter (Signed)
Steven [name] from [facility] called requesting a refill on diltiazem (CARDIZEM CD) 120 MG 24 hr capsule and diabetic supplies.  Standardized DM form completed and placed in provider box to sign and fax to pharmacy.  Derl Barrow, RN

## 2015-08-03 ENCOUNTER — Inpatient Hospital Stay (HOSPITAL_COMMUNITY): Payer: Medicaid Other

## 2015-08-03 ENCOUNTER — Emergency Department (HOSPITAL_COMMUNITY): Payer: Medicaid Other

## 2015-08-03 ENCOUNTER — Encounter (HOSPITAL_COMMUNITY): Payer: Self-pay

## 2015-08-03 ENCOUNTER — Inpatient Hospital Stay (HOSPITAL_COMMUNITY)
Admission: EM | Admit: 2015-08-03 | Discharge: 2015-08-12 | DRG: 637 | Disposition: A | Discharge status: Skilled Nursing Facility | Payer: Medicaid Other | Attending: Family Medicine | Admitting: Family Medicine

## 2015-08-03 DIAGNOSIS — R569 Unspecified convulsions: Secondary | ICD-10-CM | POA: Diagnosis present

## 2015-08-03 DIAGNOSIS — I2699 Other pulmonary embolism without acute cor pulmonale: Secondary | ICD-10-CM | POA: Diagnosis present

## 2015-08-03 DIAGNOSIS — I480 Paroxysmal atrial fibrillation: Secondary | ICD-10-CM | POA: Diagnosis present

## 2015-08-03 DIAGNOSIS — Z89512 Acquired absence of left leg below knee: Secondary | ICD-10-CM

## 2015-08-03 DIAGNOSIS — G40909 Epilepsy, unspecified, not intractable, without status epilepticus: Secondary | ICD-10-CM | POA: Diagnosis not present

## 2015-08-03 DIAGNOSIS — Z89511 Acquired absence of right leg below knee: Secondary | ICD-10-CM | POA: Diagnosis not present

## 2015-08-03 DIAGNOSIS — E11319 Type 2 diabetes mellitus with unspecified diabetic retinopathy without macular edema: Secondary | ICD-10-CM | POA: Diagnosis present

## 2015-08-03 DIAGNOSIS — E162 Hypoglycemia, unspecified: Secondary | ICD-10-CM | POA: Insufficient documentation

## 2015-08-03 DIAGNOSIS — F4321 Adjustment disorder with depressed mood: Secondary | ICD-10-CM | POA: Diagnosis present

## 2015-08-03 DIAGNOSIS — R1310 Dysphagia, unspecified: Secondary | ICD-10-CM | POA: Insufficient documentation

## 2015-08-03 DIAGNOSIS — M79651 Pain in right thigh: Secondary | ICD-10-CM

## 2015-08-03 DIAGNOSIS — M79604 Pain in right leg: Secondary | ICD-10-CM

## 2015-08-03 DIAGNOSIS — Z86718 Personal history of other venous thrombosis and embolism: Secondary | ICD-10-CM | POA: Diagnosis not present

## 2015-08-03 DIAGNOSIS — Z833 Family history of diabetes mellitus: Secondary | ICD-10-CM | POA: Diagnosis not present

## 2015-08-03 DIAGNOSIS — F17210 Nicotine dependence, cigarettes, uncomplicated: Secondary | ICD-10-CM | POA: Diagnosis present

## 2015-08-03 DIAGNOSIS — Z7982 Long term (current) use of aspirin: Secondary | ICD-10-CM | POA: Diagnosis not present

## 2015-08-03 DIAGNOSIS — R911 Solitary pulmonary nodule: Secondary | ICD-10-CM | POA: Diagnosis present

## 2015-08-03 DIAGNOSIS — E876 Hypokalemia: Secondary | ICD-10-CM | POA: Diagnosis present

## 2015-08-03 DIAGNOSIS — Z515 Encounter for palliative care: Secondary | ICD-10-CM | POA: Diagnosis not present

## 2015-08-03 DIAGNOSIS — E11649 Type 2 diabetes mellitus with hypoglycemia without coma: Secondary | ICD-10-CM | POA: Diagnosis present

## 2015-08-03 DIAGNOSIS — I872 Venous insufficiency (chronic) (peripheral): Secondary | ICD-10-CM | POA: Diagnosis present

## 2015-08-03 DIAGNOSIS — IMO0002 Reserved for concepts with insufficient information to code with codable children: Secondary | ICD-10-CM

## 2015-08-03 DIAGNOSIS — E1165 Type 2 diabetes mellitus with hyperglycemia: Secondary | ICD-10-CM

## 2015-08-03 DIAGNOSIS — Z79899 Other long term (current) drug therapy: Secondary | ICD-10-CM

## 2015-08-03 DIAGNOSIS — E118 Type 2 diabetes mellitus with unspecified complications: Secondary | ICD-10-CM

## 2015-08-03 DIAGNOSIS — R451 Restlessness and agitation: Secondary | ICD-10-CM | POA: Diagnosis not present

## 2015-08-03 DIAGNOSIS — M79609 Pain in unspecified limb: Secondary | ICD-10-CM

## 2015-08-03 DIAGNOSIS — R6510 Systemic inflammatory response syndrome (SIRS) of non-infectious origin without acute organ dysfunction: Secondary | ICD-10-CM | POA: Diagnosis present

## 2015-08-03 DIAGNOSIS — R4182 Altered mental status, unspecified: Secondary | ICD-10-CM | POA: Diagnosis not present

## 2015-08-03 DIAGNOSIS — Y835 Amputation of limb(s) as the cause of abnormal reaction of the patient, or of later complication, without mention of misadventure at the time of the procedure: Secondary | ICD-10-CM | POA: Diagnosis present

## 2015-08-03 DIAGNOSIS — E1142 Type 2 diabetes mellitus with diabetic polyneuropathy: Secondary | ICD-10-CM | POA: Diagnosis present

## 2015-08-03 DIAGNOSIS — Z7189 Other specified counseling: Secondary | ICD-10-CM | POA: Diagnosis not present

## 2015-08-03 DIAGNOSIS — R05 Cough: Secondary | ICD-10-CM

## 2015-08-03 DIAGNOSIS — G0490 Encephalitis and encephalomyelitis, unspecified: Secondary | ICD-10-CM | POA: Diagnosis not present

## 2015-08-03 DIAGNOSIS — I10 Essential (primary) hypertension: Secondary | ICD-10-CM | POA: Diagnosis present

## 2015-08-03 DIAGNOSIS — T8789 Other complications of amputation stump: Secondary | ICD-10-CM | POA: Diagnosis present

## 2015-08-03 DIAGNOSIS — L97119 Non-pressure chronic ulcer of right thigh with unspecified severity: Secondary | ICD-10-CM | POA: Diagnosis present

## 2015-08-03 DIAGNOSIS — F1910 Other psychoactive substance abuse, uncomplicated: Secondary | ICD-10-CM | POA: Diagnosis not present

## 2015-08-03 DIAGNOSIS — Z66 Do not resuscitate: Secondary | ICD-10-CM | POA: Diagnosis not present

## 2015-08-03 DIAGNOSIS — Z7901 Long term (current) use of anticoagulants: Secondary | ICD-10-CM | POA: Diagnosis not present

## 2015-08-03 DIAGNOSIS — E785 Hyperlipidemia, unspecified: Secondary | ICD-10-CM | POA: Diagnosis present

## 2015-08-03 DIAGNOSIS — Z794 Long term (current) use of insulin: Secondary | ICD-10-CM

## 2015-08-03 DIAGNOSIS — R000 Tachycardia, unspecified: Secondary | ICD-10-CM | POA: Diagnosis not present

## 2015-08-03 DIAGNOSIS — R059 Cough, unspecified: Secondary | ICD-10-CM

## 2015-08-03 DIAGNOSIS — R509 Fever, unspecified: Secondary | ICD-10-CM | POA: Diagnosis not present

## 2015-08-03 LAB — CBC
HCT: 53.4 % — ABNORMAL HIGH (ref 39.0–52.0)
HEMOGLOBIN: 17.5 g/dL — AB (ref 13.0–17.0)
MCH: 28.3 pg (ref 26.0–34.0)
MCHC: 32.8 g/dL (ref 30.0–36.0)
MCV: 86.3 fL (ref 78.0–100.0)
PLATELETS: 234 10*3/uL (ref 150–400)
RBC: 6.19 MIL/uL — ABNORMAL HIGH (ref 4.22–5.81)
RDW: 16.1 % — AB (ref 11.5–15.5)
WBC: 16.8 10*3/uL — ABNORMAL HIGH (ref 4.0–10.5)

## 2015-08-03 LAB — I-STAT CG4 LACTIC ACID, ED
LACTIC ACID, VENOUS: 3.21 mmol/L — AB (ref 0.5–2.0)
LACTIC ACID, VENOUS: 1.98 mmol/L (ref 0.5–2.0)
Lactic Acid, Venous: 0.96 mmol/L (ref 0.5–2.0)

## 2015-08-03 LAB — ACETAMINOPHEN LEVEL

## 2015-08-03 LAB — URINALYSIS, ROUTINE W REFLEX MICROSCOPIC
BILIRUBIN URINE: NEGATIVE
Glucose, UA: >1000 mg/dL — AB
KETONES UR: NEGATIVE mg/dL
LEUKOCYTES UA: NEGATIVE
NITRITE: NEGATIVE
Specific Gravity, Urine: 1.019 (ref 1.005–1.030)
pH: 7.5 (ref 5.0–8.0)

## 2015-08-03 LAB — I-STAT ARTERIAL BLOOD GAS, ED
Acid-base deficit: 2 mmol/L (ref 0.0–2.0)
Bicarbonate: 22.6 mEq/L (ref 20.0–24.0)
O2 Saturation: 94 %
PCO2 ART: 36.4 mmHg (ref 35.0–45.0)
PH ART: 7.401 (ref 7.350–7.450)
TCO2: 24 mmol/L (ref 0–100)
pO2, Arterial: 71 mmHg — ABNORMAL LOW (ref 80.0–100.0)

## 2015-08-03 LAB — I-STAT CHEM 8, ED
BUN: 14 mg/dL (ref 6–20)
CALCIUM ION: 1.17 mmol/L (ref 1.12–1.23)
CHLORIDE: 101 mmol/L (ref 101–111)
CREATININE: 0.6 mg/dL — AB (ref 0.61–1.24)
Glucose, Bld: 111 mg/dL — ABNORMAL HIGH (ref 65–99)
HCT: 54 % — ABNORMAL HIGH (ref 39.0–52.0)
Hemoglobin: 18.4 g/dL — ABNORMAL HIGH (ref 13.0–17.0)
Potassium: 3.5 mmol/L (ref 3.5–5.1)
SODIUM: 140 mmol/L (ref 135–145)
TCO2: 24 mmol/L (ref 0–100)

## 2015-08-03 LAB — COMPREHENSIVE METABOLIC PANEL
ALT: 17 U/L (ref 17–63)
ANION GAP: 12 (ref 5–15)
AST: 27 U/L (ref 15–41)
Albumin: 3 g/dL — ABNORMAL LOW (ref 3.5–5.0)
Alkaline Phosphatase: 79 U/L (ref 38–126)
BUN: 13 mg/dL (ref 6–20)
CALCIUM: 10.1 mg/dL (ref 8.9–10.3)
CHLORIDE: 101 mmol/L (ref 101–111)
CO2: 23 mmol/L (ref 22–32)
Creatinine, Ser: 0.8 mg/dL (ref 0.61–1.24)
GFR calc non Af Amer: >60 mL/min (ref >60)
Glucose, Bld: 120 mg/dL — ABNORMAL HIGH (ref 65–99)
POTASSIUM: 3.9 mmol/L (ref 3.5–5.1)
SODIUM: 136 mmol/L (ref 135–145)
Total Bilirubin: 0.3 mg/dL (ref 0.3–1.2)
Total Protein: 8.3 g/dL — ABNORMAL HIGH (ref 6.5–8.1)

## 2015-08-03 LAB — TROPONIN I
TROPONIN I: 0.05 ng/mL — AB (ref <0.031)
Troponin I: 0.04 ng/mL — ABNORMAL HIGH (ref <0.031)

## 2015-08-03 LAB — CBG MONITORING, ED
GLUCOSE-CAPILLARY: 165 mg/dL — AB (ref 65–99)
GLUCOSE-CAPILLARY: 141 mg/dL — AB (ref 65–99)
GLUCOSE-CAPILLARY: 148 mg/dL — AB (ref 65–99)
GLUCOSE-CAPILLARY: 103 mg/dL — AB (ref 65–99)
Glucose-Capillary: 43 mg/dL — CL (ref 65–99)
Glucose-Capillary: 99 mg/dL (ref 65–99)
Glucose-Capillary: 43 mg/dL — CL (ref 65–99)

## 2015-08-03 LAB — AMMONIA: Ammonia: 30 umol/L (ref 9–35)

## 2015-08-03 LAB — URINE MICROSCOPIC-ADD ON

## 2015-08-03 LAB — RAPID URINE DRUG SCREEN, HOSP PERFORMED
AMPHETAMINES: NOT DETECTED
BARBITURATES: NOT DETECTED
BENZODIAZEPINES: NOT DETECTED
COCAINE: POSITIVE — AB
Opiates: NOT DETECTED
TETRAHYDROCANNABINOL: NOT DETECTED

## 2015-08-03 LAB — ETHANOL: Alcohol, Ethyl (B): <5 mg/dL (ref <5)

## 2015-08-03 LAB — MAGNESIUM: Magnesium: 1.3 mg/dL — ABNORMAL LOW (ref 1.7–2.4)

## 2015-08-03 LAB — SALICYLATE LEVEL: Salicylate Lvl: <4 mg/dL (ref 2.8–30.0)

## 2015-08-03 LAB — CK: CK TOTAL: 183 U/L (ref 49–397)

## 2015-08-03 LAB — PHOSPHORUS: Phosphorus: 2.5 mg/dL (ref 2.5–4.6)

## 2015-08-03 MED ORDER — LORAZEPAM 2 MG/ML IJ SOLN
2.0000 mg | INTRAMUSCULAR | Status: DC | PRN
  Administered 2015-08-04 – 2015-08-05 (×8): 2 mg via INTRAVENOUS
  Administered 2015-08-06: 3 mg via INTRAVENOUS
  Administered 2015-08-06 (×2): 2 mg via INTRAVENOUS
  Administered 2015-08-07: 1 mg via INTRAVENOUS
  Administered 2015-08-07 – 2015-08-09 (×5): 2 mg via INTRAVENOUS
  Filled 2015-08-03 (×11): qty 1
  Filled 2015-08-03: qty 2
  Filled 2015-08-03 (×5): qty 1

## 2015-08-03 MED ORDER — SODIUM CHLORIDE 0.9 % IV SOLN
2.0000 g | INTRAVENOUS | Status: DC
  Administered 2015-08-04 (×4): 2 g via INTRAVENOUS
  Filled 2015-08-03 (×9): qty 2000

## 2015-08-03 MED ORDER — PIPERACILLIN-TAZOBACTAM 3.375 G IVPB: 3.3750 g | Freq: Three times a day (TID) | INTRAVENOUS | Status: DC

## 2015-08-03 MED ORDER — GADOBENATE DIMEGLUMINE 529 MG/ML IV SOLN
18.0000 mL | Freq: Once | INTRAVENOUS | Status: AC | PRN
  Administered 2015-08-03: 18 mL via INTRAVENOUS

## 2015-08-03 MED ORDER — HYDRALAZINE HCL 20 MG/ML IJ SOLN
10.0000 mg | Freq: Four times a day (QID) | INTRAMUSCULAR | Status: DC | PRN
  Administered 2015-08-07: 10 mg via INTRAVENOUS
  Filled 2015-08-03: qty 1

## 2015-08-03 MED ORDER — METOPROLOL TARTRATE 1 MG/ML IV SOLN: 5.0000 mg | Freq: Four times a day (QID) | INTRAVENOUS | Status: DC | PRN

## 2015-08-03 MED ORDER — NALOXONE HCL 0.4 MG/ML IJ SOLN
INTRAMUSCULAR | Status: AC
  Filled 2015-08-03: qty 1

## 2015-08-03 MED ORDER — PIPERACILLIN-TAZOBACTAM 3.375 G IVPB 30 MIN
3.3750 g | Freq: Once | INTRAVENOUS | Status: AC
  Administered 2015-08-03: 3.375 g via INTRAVENOUS
  Filled 2015-08-03: qty 50

## 2015-08-03 MED ORDER — SODIUM CHLORIDE 0.9 % IV BOLUS (SEPSIS)
500.0000 mL | Freq: Once | INTRAVENOUS | Status: AC
  Administered 2015-08-03: 500 mL via INTRAVENOUS

## 2015-08-03 MED ORDER — DEXTROSE 50 % IV SOLN
1.0000 | Freq: Once | INTRAVENOUS | Status: AC
  Administered 2015-08-03: 50 mL via INTRAVENOUS
  Filled 2015-08-03: qty 50

## 2015-08-03 MED ORDER — SODIUM CHLORIDE 0.9 % IV BOLUS (SEPSIS): 1000.0000 mL | Freq: Once | INTRAVENOUS | Status: DC

## 2015-08-03 MED ORDER — ACETAMINOPHEN 650 MG RE SUPP
325.0000 mg | RECTAL | Status: DC | PRN
  Administered 2015-08-03 – 2015-08-08 (×2): 325 mg via RECTAL
  Filled 2015-08-03 (×2): qty 1

## 2015-08-03 MED ORDER — VANCOMYCIN HCL 10 G IV SOLR
2000.0000 mg | Freq: Once | INTRAVENOUS | Status: AC
  Administered 2015-08-03: 2000 mg via INTRAVENOUS
  Filled 2015-08-03: qty 2000

## 2015-08-03 MED ORDER — DEXTROSE 5 % IV SOLN
2.0000 g | Freq: Two times a day (BID) | INTRAVENOUS | Status: DC
  Administered 2015-08-03 – 2015-08-09 (×12): 2 g via INTRAVENOUS
  Filled 2015-08-03 (×14): qty 2

## 2015-08-03 MED ORDER — NALOXONE HCL 0.4 MG/ML IJ SOLN
0.4000 mg | Freq: Once | INTRAMUSCULAR | Status: AC
  Administered 2015-08-03: 0.4 mg via INTRAVENOUS

## 2015-08-03 MED ORDER — ENOXAPARIN SODIUM 40 MG/0.4ML ~~LOC~~ SOLN
40.0000 mg | Freq: Every day | SUBCUTANEOUS | Status: DC
Start: 2015-08-03 — End: 2015-08-04
  Administered 2015-08-04: 40 mg via SUBCUTANEOUS
  Filled 2015-08-03 (×2): qty 0.4

## 2015-08-03 MED ORDER — LORAZEPAM 2 MG/ML IJ SOLN
2.0000 mg | Freq: Four times a day (QID) | INTRAMUSCULAR | Status: DC
  Administered 2015-08-03: 2 mg via INTRAVENOUS
  Filled 2015-08-03: qty 1

## 2015-08-03 MED ORDER — DEXTROSE-NACL 5-0.45 % IV SOLN
INTRAVENOUS | Status: DC
  Administered 2015-08-03 – 2015-08-07 (×5): via INTRAVENOUS
  Filled 2015-08-03: qty 1000

## 2015-08-03 MED ORDER — VANCOMYCIN HCL IN DEXTROSE 1-5 GM/200ML-% IV SOLN
1000.0000 mg | Freq: Three times a day (TID) | INTRAVENOUS | Status: DC
  Administered 2015-08-04 – 2015-08-07 (×12): 1000 mg via INTRAVENOUS
  Filled 2015-08-03 (×14): qty 200

## 2015-08-03 MED ORDER — LORAZEPAM 2 MG/ML IJ SOLN
2.0000 mg | Freq: Once | INTRAMUSCULAR | Status: AC
  Administered 2015-08-03: 2 mg via INTRAVENOUS
  Filled 2015-08-03: qty 1

## 2015-08-03 MED ORDER — FOLIC ACID 5 MG/ML IJ SOLN
1.0000 mg | Freq: Every day | INTRAMUSCULAR | Status: DC
  Administered 2015-08-03 – 2015-08-11 (×9): 1 mg via INTRAVENOUS
  Filled 2015-08-03 (×8): qty 0.2
  Filled 2015-08-03: qty 0
  Filled 2015-08-03 (×2): qty 0.2

## 2015-08-03 MED ORDER — THIAMINE HCL 100 MG/ML IJ SOLN
100.0000 mg | Freq: Every day | INTRAMUSCULAR | Status: DC
  Administered 2015-08-03 – 2015-08-11 (×9): 100 mg via INTRAVENOUS
  Filled 2015-08-03 (×9): qty 2

## 2015-08-03 MED ORDER — SODIUM CHLORIDE 0.9% FLUSH
3.0000 mL | Freq: Two times a day (BID) | INTRAVENOUS | Status: DC
  Administered 2015-08-03 – 2015-08-09 (×7): 3 mL via INTRAVENOUS
  Administered 2015-08-10: 10 mL via INTRAVENOUS
  Administered 2015-08-10: 3 mL via INTRAVENOUS

## 2015-08-03 MED ORDER — DEXTROSE 10 % IV SOLN
INTRAVENOUS | Status: DC
  Administered 2015-08-03: 16:00:00 via INTRAVENOUS

## 2015-08-03 NOTE — Progress Notes (Signed)
EEG Completed; Results Pending  

## 2015-08-03 NOTE — Progress Notes (Signed)
Contacted concerning elevated rectal temperature to 101.52F. Will give Acetaminophen suppository for fever. Now meningitis is more concerning on differential given altered mental status and fever. Contacted ED to attempt LP. They are unable to at this time and patient currently in MRI. Recommended contacted IR. IR contacted and state that they do not normally do LPs and that diagnostic radiologists will only do it if multiple attempts have been made and failed. Recommended contacted neurology. Neurology contacted and stated they normally do LPs on floor and ED physicians are responsible for LPs while in ED. States to recontact if ED unable to attempt and will come down if necessary. ED physician contacted once more and asked to attempt LP if possible. They will reattempt if possible.  Pharmacy contacted concerning antibiotics. Already received doses of Vancomycin and Zosyn prior to fevers. Will discontinue Zosyn and initiate Ampicillin and Ceftriaxone after LP. Note that pseudomonas will not be covered and if concerned about contributing to symptoms may need an additional antibiotic.  Dr. Gerlean Ren 08/03/15, 9:16 PM

## 2015-08-03 NOTE — ED Notes (Signed)
Pt placed on 2L nasal cannula, O2 saturation at 99% prior to administration, O2 now at 100%.

## 2015-08-03 NOTE — ED Notes (Signed)
Pt's CBG result was 99. Informed Dr. Jeanell Sparrow and Jerene Pitch - RN.

## 2015-08-03 NOTE — ED Notes (Signed)
[facility]- pt coming from home, pt here for altered mental status and noted to be hypoglycemic. Pt was found by his home health aide minimally responsive. CBG intially 30, pt given 1unit of glucagon IM prior to arrival.

## 2015-08-03 NOTE — Consult Note (Signed)
Pharmacy Antibiotic Note  Steven [name] is a 55 y.o. male admitted on 08/03/2015 with sepsis.  Pharmacy has been consulted for vanc/zosyn dosing.  Pt found at home with AMS and presented with elevated lactic acid and WBC.   Plan: Vanc 2 g IV x1 then vanc 1 g IV q8h Zosyn 3.375 g IV over 30 min x1, then 3.375 g IV q8h Monitor renal function, cultures, VT prn, LOT, clinical progression   Height: 6\' 4"  (193 cm) Weight: 201 lb 15.1 oz (91.6 kg) IBW/kg (Calculated) : 86.8  Temp (24hrs), Avg:98.3 F (36.8 C), Min:98.3 F (36.8 C), Max:98.3 F (36.8 C)   Recent Labs Lab 08/03/15 1349 08/03/15 1432  WBC 16.8*  --   CREATININE 0.80  --   LATICACIDVEN  --  3.21*    Estimated Creatinine Clearance: 126.6 mL/min (by C-G formula based on Cr of 0.8).    No Known Allergies  Antimicrobials this admission: Vanc 2/21 >>  Zosyn 2/21 >>   Dose adjustments this admission: n/a  Microbiology results: 2/21 BCx:  2/21 UCx:     Thank you for allowing pharmacy to be a part of this patient's care.  Roma Schanz 08/03/2015 3:02 PM

## 2015-08-03 NOTE — ED Notes (Signed)
Patient transported to MRI 

## 2015-08-03 NOTE — ED Notes (Signed)
Pt back from MRI; will continue to monitor

## 2015-08-03 NOTE — ED Notes (Signed)
Pt opens eyes to verbal stimuli; pt continues to be nonverbal; pt cannot make eye contact- pt stares into space; pt unable to follow commands at this time

## 2015-08-03 NOTE — Consult Note (Addendum)
NEURO HOSPITALIST CONSULT NOTE   Requestig physician: Dr. Jeanell Sparrow   Reason for Consult: New onset seizure  HPI:                                                                                                                                          Steven [name] is an 55 y.o. male with known history of ETOH abuse and tobacco abuse. Patient presented to the ED with AMS -found down , slumped over and foaming at the mouth with a BG of 30.  Given a AMP of D50 with no significant change--BG increased to 140 with no change in MS. Given narcan with no change.  While in ED he had a episode of decorticate posturing and eyes deviated up and to the right along with head turned to the right-- jaw clenched. UDS positive for cocaine. Sister in room and is unsure about if he may have quite drinking.  Currently he is obtunded with no clinical seizure activity after 2 mg Ativan.   Currently his BG has dropped back to 40 and ER is about to hang D5W  Past Medical History  Diagnosis Date  . Uncontrolled diabetes mellitus ([place])   . HTN (hypertension)   . History of DVT (deep vein thrombosis)   . Personal history of diabetic foot ulcer   . Peripheral vascular disease ([place])     a. Abnl ABIs 2014.  Marland Kitchen Peripheral neuropathy ([place]) 11/19/2011  . Varicose vein     legs  . Osteomyelitis of foot, right, acute ([place]) 08/30/2012  . Personal history of colonic adenomas 05/29/2013  . History of cocaine use   . PAF (paroxysmal atrial fibrillation) ([place])     a. Dx 11/2013 in setting of sepsis/foot infection.  . Tobacco abuse   . H/O ETOH abuse   . Dysrhythmia     afib last admission  . Seasonal allergies   . Adjustment disorder with depressed mood     Past Surgical History  Procedure Laterality Date  . Amputation  04/21/2011    Procedure: AMPUTATION RAY;  Surgeon: Newt Minion, MD;  Location: [place];  Service: Orthopedics;  Laterality: Right;  Rt foot 2nd ray ampt  . I&d extremity  05/03/2011     Procedure: IRRIGATION AND DEBRIDEMENT EXTREMITY;  Surgeon: Newt Minion, MD;  Location: [place];  Service: Orthopedics;  Laterality: Right;  Irrigation and Debridement Right Foot and Place Acell Xenograft  . Toe amputation  04/24/2012    great toe   right foot  . Amputation  04/24/2012    Procedure: AMPUTATION RAY;  Surgeon: Newt Minion, MD;  Location: [place];  Service: Orthopedics;  Laterality: Right;  Right foot 1st ray amputation  . Amputation Left 04/23/2013    Procedure: AMPUTATION  RAY;  Surgeon: Newt Minion, MD;  Location: [place];  Service: Orthopedics;  Laterality: Left;  Left Foot 5th Ray Amputation  . Colonoscopy    . Amputation Left 11/21/2013    Procedure: AMPUTATION RAY;  Surgeon: Newt Minion, MD;  Location: [facility];  Service: Orthopedics;  Laterality: Left;  Left Foot 1st & 2nd Ray Amputation  . Amputation Right 12/04/2013    Procedure: AMPUTATION BELOW KNEE;  Surgeon: Marianna Payment, MD;  Location: [facility];  Service: Orthopedics;  Laterality: Right;  . Amputation Left 12/29/2013    Procedure: LEFT AMPUTATION BELOW KNEE;  Surgeon: Marianna Payment, MD;  Location: [place];  Service: Orthopedics;  Laterality: Left;  . Amputation Right 04/15/2015    Procedure: AMPUTATION ABOVE KNEE, RIGHT;  Surgeon: Newt Minion, MD;  Location: [facility];  Service: Orthopedics;  Laterality: Right;    Family History  Problem Relation Age of Onset  . Diabetes Mother   . Stroke Brother   . Colon cancer Neg Hx      Social History:  reports that he has been smoking Cigarettes.  He started smoking about 32 years ago. He has a 15 pack-year smoking history. He has never used smokeless tobacco. He reports that he drinks about 3.0 - 3.6 oz of alcohol per week. He reports that he does not use illicit drugs.  No Known Allergies  MEDICATIONS:                                                                                                                     Current Facility-Administered Medications   Medication Dose Route Frequency Provider Last Rate Last Dose  . sodium chloride 0.9 % bolus 1,000 mL  1,000 mL Intravenous Once Delsa Grana, PA-C      . vancomycin (VANCOCIN) 2,000 mg in sodium chloride 0.9 % 500 mL IVPB  2,000 mg Intravenous Once Roma Schanz, RPH 250 mL/hr at 08/03/15 1511 2,000 mg at 08/03/15 1511   Current Outpatient Prescriptions  Medication Sig Dispense Refill  . acetaminophen (TYLENOL) 325 MG tablet Take 2 tablets (650 mg total) by mouth every 6 (six) hours as needed. (Patient taking differently: Take 650 mg by mouth every 6 (six) hours as needed for mild pain. ) 60 tablet 1  . cetirizine (ZYRTEC) 10 MG tablet Take 1 tablet (10 mg total) by mouth daily. 90 tablet 3  . cholecalciferol (VITAMIN D) 1000 units tablet Take 1,000 Units by mouth daily.    Marland Kitchen doxycycline (VIBRAMYCIN) 100 MG capsule Take 100 mg by mouth 2 (two) times daily.    Marland Kitchen gabapentin (NEURONTIN) 400 MG capsule Take 2 capsules (800 mg total) by mouth 3 (three) times daily. 180 capsule 0  . insulin aspart (NOVOLOG) 100 UNIT/ML injection Inject 10 units daily with meals. 1 vial 3  . insulin glargine (LANTUS) 100 UNIT/ML injection Inject 45 units nightly. 10 mL 6  . iron polysaccharides (NIFEREX) 150 MG capsule Take 1 capsule (150 mg total) by mouth  daily. 30 capsule 0  . lisinopril (PRINIVIL,ZESTRIL) 20 MG tablet take 1 tablet by mouth once daily for blood pressure 90 tablet 2  . methocarbamol (ROBAXIN) 500 MG tablet Take 1 tablet (500 mg total) by mouth every 6 (six) hours as needed for muscle spasms. 90 tablet 0  . Multiple Vitamin (MULTIVITAMIN WITH MINERALS) TABS tablet Take 1 tablet by mouth daily. 30 tablet 0  . rivaroxaban (XARELTO) 20 MG TABS tablet Take 1 tablet (20 mg total) by mouth daily with supper. For Atrial Fibrillation 90 tablet 3  . SSD 1 % cream Apply 1 application topically daily as needed. Rash/irritation  0  . traMADol (ULTRAM) 50 MG tablet Take 1 tablet (50 mg total) by mouth 4 (four)  times daily -  before meals and at bedtime. 120 tablet 0  . aspirin EC 81 MG tablet Take 2 tablets (162 mg total) by mouth daily. (Patient not taking: Reported on 08/03/2015) 60 tablet 0  . atorvastatin (LIPITOR) 40 MG tablet Take 1 tablet (40 mg total) by mouth daily. (Patient not taking: Reported on 04/14/2015) 30 tablet 11  . diltiazem (CARDIZEM CD) 120 MG 24 hr capsule Take 1 capsule (120 mg total) by mouth daily. (Patient not taking: Reported on 08/03/2015) 90 capsule 3  . fluticasone (FLONASE) 50 MCG/ACT nasal spray Place 2 sprays into both nostrils daily. (Patient not taking: Reported on [id number]) 16 g 1  . folic acid (FOLVITE) 1 MG tablet Take 1 tablet (1 mg total) by mouth daily. (Patient not taking: Reported on 04/14/2015) 30 tablet 1  . nicotine (NICODERM CQ - DOSED IN MG/24 HOURS) 21 mg/24hr patch Place 1 patch (21 mg total) onto the skin daily. (Patient not taking: Reported on 08/03/2015) 28 patch 0  . oxyCODONE 10 MG TABS Take 0.5-1 tablets (5-10 mg total) by mouth every 6 (six) hours as needed for severe pain. (Patient not taking: Reported on 08/03/2015) 90 tablet 0  . pantoprazole (PROTONIX) 40 MG tablet Take 1 tablet (40 mg total) by mouth at bedtime. (Patient not taking: Reported on 04/14/2015) 90 tablet 1  . polyethylene glycol (MIRALAX / GLYCOLAX) packet Take 17 g by mouth daily as needed. For constipation (Patient not taking: Reported on 08/03/2015) 14 each 0  . thiamine 100 MG tablet Take 1 tablet (100 mg total) by mouth daily. (Patient not taking: Reported on 08/03/2015) 30 tablet 0      ROS:                                                                                                                                       History obtained from unobtainable from patient due to mental status    Blood pressure 138/75, pulse 81, temperature 98.3 F (36.8 C), temperature source Rectal, resp. rate 20, height 6\' 4"  (1.93 m), weight 91.6 kg (201 lb 15.1 oz), SpO2 99 %.   Neurologic  Examination:  HEENT-  Normocephalic, no lesions, without obvious abnormality.  Normal external eye and conjunctiva.    Lungs- chest rhonchi throughout Lymph-no adenopathy palpable Musculoskeletal- lower extremity amputations noted  Neurological Examination Mental Status: Obtunded and only opens eyes to deep sternal rub. Appears most consistent with sedation from benzodiazepine versus postictal state. Cranial Nerves: [id number] pupil 98mm and sluggishly reactive, left eye 2 mm and sluggishly reactive, eyes are disconjugate with no blink to threat.  III,IV, VI: doll's intact V,VII: face symmetric, VIII: unable to assess IX,X: unable to assess XI: unable to assess XII: unable to assess Motor: flaccid throughout with no withdrawal from nailbed pressure.  Sensory: Slight grimace to painful stimuli applied to extremities.  Deep Tendon Reflexes: 1+ and symmetric throughout UE. No KJ on the left and AKA on right. Plantars: Bilateral LE amputation Cerebellar: Unable to assess Gait: not able to assess   Lab Results: Basic Metabolic Panel:  Recent Labs Lab 08/03/15 1349  NA 136  K 3.9  CL 101  CO2 23  GLUCOSE 120*  BUN 13  CREATININE 0.80  CALCIUM 10.1    Liver Function Tests:  Recent Labs Lab 08/03/15 1349  AST 27  ALT 17  ALKPHOS 79  BILITOT 0.3  PROT 8.3*  ALBUMIN 3.0*   No results for input(s): LIPASE, AMYLASE in the last 168 hours.  Recent Labs Lab 08/03/15 1345  AMMONIA 30    CBC:  Recent Labs Lab 08/03/15 1349  WBC 16.8*  HGB 17.5*  HCT 53.4*  MCV 86.3  PLT 234    Cardiac Enzymes:  Recent Labs Lab 08/03/15 1349  CKTOTAL 183  TROPONINI 0.04*    Lipid Panel: No results for input(s): CHOL, TRIG, HDL, CHOLHDL, VLDL, LDLCALC in the last 168 hours.  CBG:  Recent Labs Lab 08/03/15 1218 08/03/15 1306 08/03/15 1355  GLUCAP 43* 165* 75     Microbiology: Results for orders placed or performed during the hospital encounter of 04/19/15  Urine culture     Status: None   Collection Time: 04/20/15 12:11 AM  Result Value Ref Range Status   Specimen Description URINE, CLEAN CATCH  Final   Special Requests NONE  Final   Culture MULTIPLE SPECIES PRESENT, SUGGEST RECOLLECTION  Final   Report Status 04/21/2015 FINAL  Final    Coagulation Studies: No results for input(s): LABPROT, INR in the last 72 hours.  Imaging: Ct Head Wo Contrast  08/03/2015  CLINICAL DATA:  Altered mental status EXAM: CT HEAD WITHOUT CONTRAST TECHNIQUE: Contiguous axial images were obtained from the base of the skull through the vertex without intravenous contrast. COMPARISON:  10/10/2011 FINDINGS: Brain parenchyma and ventricular system are within normal limits no mass effect, midline shift, or acute hemorrhage. Mastoid air cells are clear. Minimal mucosal thickening in the right maxillary sinus. Intact cranium. IMPRESSION: No acute intracranial pathology. Electronically Signed   By: Marybelle Killings M.D.   On: 08/03/2015 13:27   Dg Chest Port 1 View  08/03/2015  CLINICAL DATA:  Altered mental status, history of diabetes EXAM: PORTABLE CHEST 1 VIEW COMPARISON:  12/03/2013 FINDINGS: Cardiomediastinal silhouette is stable. No acute infiltrate or pleural effusion. No pulmonary edema. Bony thorax is unremarkable. IMPRESSION: No active disease. Electronically Signed   By: Lahoma Crocker M.D.   On: 08/03/2015 12:53   History and exam documented by Etta Quill PA-C, [facility], [phone] 08/03/2015, 3:59 PM   Assessment/Plan: 1. New onset seizure. Possible etiologies include severe recurrent hypoglycemia, lesional epilepsy, cryptogenic epilepsy, ethanol withdrawal, acute effect  of cocaine abuse, possible Neurontin withdrawal and Ultram use.  2. CT head reveals no acute intracranial abnormality.  3. Paroxysmal atrial fibnillation. Given probable EtOH as well  as sympathomimetic abuse, anticoagulation would not be a safe option.  4. Leukocytosis.   Recommendations: 1. Discontinue Ultram, as this medication may lower the seizure threshold.  2. Continue Neurontin at home dose.  3. CIWA protocol. May benefit from scheduled benzodiazepine followed by taper.  4. Continue ASA.  5. Continue thiamine.  6. EEG being obtained in the ED.  7. MRI brain when stable.  8. Magnesium level.  9. If EEG shows focal electrographic abnormality or MRI reveals epileptogenic lesion, start Keppra.   Kerney Elbe, MD

## 2015-08-03 NOTE — ED Notes (Signed)
EEG at bedside.

## 2015-08-03 NOTE — ED Provider Notes (Signed)
CSN: [id number]     Arrival date & time 08/03/15  1208 History   First MD Initiated Contact with Patient 08/03/15 1215     Chief Complaint  Patient presents with  . Hypoglycemia  . Altered Mental Status     (Consider location/radiation/quality/duration/timing/severity/associated sxs/prior Treatment) HPI   Pt is a 55 y/o male with hx of uncontrolled IDDM, HTN, A fib, polysubstance abuse, he was found unresponsive at his home today by his home health aide. She comes his home at 57 PM every weekday, he did not answer the door and after she gained access to the apartment he was found slumped over, hanging halfway off his bed, leaning to the right,"foaming at the mouth and drooling." The patient's home nurse aide said he was normal yesterday without any recent illness. She also spoke with him on the phone, nothing was out of the ordinary over the past several days. His sister is at the bedside, she is unable to contribute at all to history.  Past Medical History  Diagnosis Date  . Uncontrolled diabetes mellitus ([place])   . HTN (hypertension)   . History of DVT (deep vein thrombosis)   . Personal history of diabetic foot ulcer   . Peripheral vascular disease ([place])     a. Abnl ABIs 2014.  Marland Kitchen Peripheral neuropathy ([place]) 11/19/2011  . Varicose vein     legs  . Osteomyelitis of foot, right, acute ([place]) 08/30/2012  . Personal history of colonic adenomas 05/29/2013  . History of cocaine use   . PAF (paroxysmal atrial fibrillation) ([place])     a. Dx 11/2013 in setting of sepsis/foot infection.  . Tobacco abuse   . H/O ETOH abuse   . Dysrhythmia     afib last admission  . Seasonal allergies   . Adjustment disorder with depressed mood    Past Surgical History  Procedure Laterality Date  . Amputation  04/21/2011    Procedure: AMPUTATION RAY;  Surgeon: Newt Minion, MD;  Location: [place];  Service: Orthopedics;  Laterality: Right;  Rt foot 2nd ray ampt  . I&d extremity  05/03/2011    Procedure:  IRRIGATION AND DEBRIDEMENT EXTREMITY;  Surgeon: Newt Minion, MD;  Location: [place];  Service: Orthopedics;  Laterality: Right;  Irrigation and Debridement Right Foot and Place Acell Xenograft  . Toe amputation  04/24/2012    great toe   right foot  . Amputation  04/24/2012    Procedure: AMPUTATION RAY;  Surgeon: Newt Minion, MD;  Location: [place];  Service: Orthopedics;  Laterality: Right;  Right foot 1st ray amputation  . Amputation Left 04/23/2013    Procedure: AMPUTATION RAY;  Surgeon: Newt Minion, MD;  Location: [place];  Service: Orthopedics;  Laterality: Left;  Left Foot 5th Ray Amputation  . Colonoscopy    . Amputation Left 11/21/2013    Procedure: AMPUTATION RAY;  Surgeon: Newt Minion, MD;  Location: [place];  Service: Orthopedics;  Laterality: Left;  Left Foot 1st & 2nd Ray Amputation  . Amputation Right 12/04/2013    Procedure: AMPUTATION BELOW KNEE;  Surgeon: Marianna Payment, MD;  Location: [place];  Service: Orthopedics;  Laterality: Right;  . Amputation Left 12/29/2013    Procedure: LEFT AMPUTATION BELOW KNEE;  Surgeon: Marianna Payment, MD;  Location: [place];  Service: Orthopedics;  Laterality: Left;  . Amputation Right 04/15/2015    Procedure: AMPUTATION ABOVE KNEE, RIGHT;  Surgeon: Newt Minion, MD;  Location: [place];  Service: Orthopedics;  Laterality: Right;   Family History  Problem Relation Age of Onset  . Diabetes Mother   . Stroke Brother   . Colon cancer Neg Hx    Social History  Substance Use Topics  . Smoking status: Current Every Day Smoker -- 0.50 packs/day for 30 years    Types: Cigarettes    Start date: 06/13/1983  . Smokeless tobacco: Never Used  . Alcohol Use: 3.0 - 3.6 oz/week    5-6 Standard drinks or equivalent per week     Comment: t states he hs rank any alcholol rcently.  drinks off and on    Review of Systems  Unable to perform ROS: Mental status change      Allergies  Review of patient's allergies indicates no known allergies.  Home  Medications   Prior to Admission medications   Medication Sig Start Date End Date Taking? Authorizing Provider  acetaminophen (TYLENOL) 325 MG tablet Take 2 tablets (650 mg total) by mouth every 6 (six) hours as needed. Patient taking differently: Take 650 mg by mouth every 6 (six) hours as needed for mild pain.  06/28/15  Yes Hillary Corinda Gubler, MD  cetirizine (ZYRTEC) 10 MG tablet Take 1 tablet (10 mg total) by mouth daily. 05/04/15  Yes Leeanne Rio, MD  cholecalciferol (VITAMIN D) 1000 units tablet Take 1,000 Units by mouth daily.   Yes Historical Provider, MD  doxycycline (VIBRAMYCIN) 100 MG capsule Take 100 mg by mouth 2 (two) times daily.   Yes Historical Provider, MD  gabapentin (NEURONTIN) 400 MG capsule Take 2 capsules (800 mg total) by mouth 3 (three) times daily. 04/27/15  Yes Ivan Anchors Love, PA-C  insulin aspart (NOVOLOG) 100 UNIT/ML injection Inject 10 units daily with meals. 06/28/15  Yes Hillary Corinda Gubler, MD  insulin glargine (LANTUS) 100 UNIT/ML injection Inject 45 units nightly. 06/28/15  Yes Hillary Corinda Gubler, MD  iron polysaccharides (NIFEREX) 150 MG capsule Take 1 capsule (150 mg total) by mouth daily. 06/28/15  Yes Hillary Corinda Gubler, MD  lisinopril (PRINIVIL,ZESTRIL) 20 MG tablet take 1 tablet by mouth once daily for blood pressure 06/01/15  Yes Leeanne Rio, MD  methocarbamol (ROBAXIN) 500 MG tablet Take 1 tablet (500 mg total) by mouth every 6 (six) hours as needed for muscle spasms. 07/30/15  Yes Leeanne Rio, MD  Multiple Vitamin (MULTIVITAMIN WITH MINERALS) TABS tablet Take 1 tablet by mouth daily. 04/27/15  Yes Ivan Anchors Love, PA-C  rivaroxaban (XARELTO) 20 MG TABS tablet Take 1 tablet (20 mg total) by mouth daily with supper. For Atrial Fibrillation 06/28/15  Yes Hillary Corinda Gubler, MD  SSD 1 % cream Apply 1 application topically daily as needed. Rash/irritation 07/27/15  Yes Historical Provider, MD  traMADol (ULTRAM) 50 MG  tablet Take 1 tablet (50 mg total) by mouth 4 (four) times daily -  before meals and at bedtime. 04/27/15  Yes Bary Leriche, PA-C  aspirin EC 81 MG tablet Take 2 tablets (162 mg total) by mouth daily. Patient not taking: Reported on 08/03/2015 04/27/15   Ivan Anchors Love, PA-C  atorvastatin (LIPITOR) 40 MG tablet Take 1 tablet (40 mg total) by mouth daily. Patient not taking: Reported on 04/14/2015 03/31/14   Leeanne Rio, MD  diltiazem (CARDIZEM CD) 120 MG 24 hr capsule Take 1 capsule (120 mg total) by mouth daily. Patient not taking: Reported on 08/03/2015 05/04/15   Leeanne Rio, MD  fluticasone [facility]) 50 MCG/ACT nasal spray Place 2  sprays into both nostrils daily. Patient not taking: Reported on 04/14/2015 09/09/14   Leeanne Rio, MD  folic acid (FOLVITE) 1 MG tablet Take 1 tablet (1 mg total) by mouth daily. Patient not taking: Reported on 04/14/2015 12/18/13   Ivan Anchors Love, PA-C  nicotine (NICODERM CQ - DOSED IN MG/24 HOURS) 21 mg/24hr patch Place 1 patch (21 mg total) onto the skin daily. Patient not taking: Reported on 08/03/2015 04/19/15   Smiley Houseman, MD  oxyCODONE 10 MG TABS Take 0.5-1 tablets (5-10 mg total) by mouth every 6 (six) hours as needed for severe pain. Patient not taking: Reported on 08/03/2015 04/27/15   Ivan Anchors Love, PA-C  pantoprazole (PROTONIX) 40 MG tablet Take 1 tablet (40 mg total) by mouth at bedtime. Patient not taking: Reported on 04/14/2015 09/25/14   Leeanne Rio, MD  polyethylene glycol [facility] Floria Raveling) packet Take 17 g by mouth daily as needed. For constipation Patient not taking: Reported on 08/03/2015 04/27/15   Ivan Anchors Love, PA-C  thiamine 100 MG tablet Take 1 tablet (100 mg total) by mouth daily. Patient not taking: Reported on 08/03/2015 04/27/15   Ivan Anchors Love, PA-C   BP 144/77 mmHg  Pulse 81  Temp(Src) 98.3 F (36.8 C) (Rectal)  Resp 22  Ht 6\' 4"  (1.93 m)  Wt 91.6 kg  BMI 24.59 kg/m2  SpO2 99% Physical Exam    Constitutional: He appears well-developed and well-nourished. He has a sickly appearance.  Chronically ill-appearing male  HENT:  Head: Normocephalic and atraumatic.    Nose: Nose normal.  Mouth/Throat: Mucous membranes are not pale.  Eyes:  Eyes deviated upward and to the right Pupils symmetrical, sluggish response to light   Neck: Carotid bruit is not present. No tracheal deviation, no edema and no erythema present.  Cardiovascular: An irregularly irregular rhythm present. Exam reveals no gallop and no friction rub.   No murmur heard. Irregularly irregular  Pulmonary/Chest: No respiratory distress. He has no wheezes.  Upper airway transmitted congestion and scattered rhonchi with belly breathing, intermittent clearing of his throat and coughing  Abdominal: Soft. Normal appearance and bowel sounds are normal. He exhibits no distension. There is no tenderness. There is no rigidity, no rebound and no guarding.  Musculoskeletal: He exhibits no edema or tenderness.  Neurological: He is disoriented and unresponsive. He exhibits abnormal muscle tone. He displays no seizure activity. Coordination abnormal. GCS eye subscore is 4. GCS verbal subscore is 2. GCS motor subscore is 3.  Responds to pain, +gag reflex  Skin: Skin is warm. He is not diaphoretic. No erythema.  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, non-saturated  Psychiatric:  Unable to assess    ED Course  Procedures (including critical care time) Labs Review Labs Reviewed  URINALYSIS, ROUTINE W REFLEX MICROSCOPIC (NOT AT [facility]) - Abnormal; Notable for the following:    Glucose, UA >1000 (*)    Hgb urine dipstick MODERATE (*)    Protein, ur >300 (*)    All other components within normal limits  COMPREHENSIVE METABOLIC PANEL - Abnormal; Notable for the following:    Glucose, Bld 120 (*)    Total Protein 8.3 (*)    Albumin 3.0 (*)    All other components within normal limits  CBC - Abnormal; Notable for the following:    WBC 16.8 (*)    RBC  6.19 (*)    Hemoglobin 17.5 (*)    HCT 53.4 (*)    RDW 16.1 (*)  All other components within normal limits  TROPONIN I - Abnormal; Notable for the following:    Troponin I 0.04 (*)    All other components within normal limits  URINE RAPID DRUG SCREEN, HOSP PERFORMED - Abnormal; Notable for the following:    Cocaine POSITIVE (*)    All other components within normal limits  URINE MICROSCOPIC-ADD ON - Abnormal; Notable for the following:    Squamous Epithelial / LPF 0-5 (*)    Bacteria, UA FEW (*)    Casts GRANULAR CAST (*)    All other components within normal limits  CBG MONITORING, ED - Abnormal; Notable for the following:    Glucose-Capillary 43 (*)    All other components within normal limits  CBG MONITORING, ED - Abnormal; Notable for the following:    Glucose-Capillary 165 (*)    All other components within normal limits  I-STAT CG4 LACTIC ACID, ED - Abnormal; Notable for the following:    Lactic Acid, Venous 3.21 (*)    All other components within normal limits  CBG MONITORING, ED - Abnormal; Notable for the following:    Glucose-Capillary 43 (*)    All other components within normal limits  I-STAT ARTERIAL BLOOD GAS, ED - Abnormal; Notable for the following:    pO2, Arterial 71.0 (*)    All other components within normal limits  CULTURE, BLOOD (ROUTINE X 2)  CULTURE, BLOOD (ROUTINE X 2)  URINE CULTURE  CK  AMMONIA  ETHANOL  ACETAMINOPHEN LEVEL  SALICYLATE LEVEL  C-PEPTIDE  CBG MONITORING, ED  I-STAT CHEM 8, ED  CBG MONITORING, ED    Imaging Review Ct Head Wo Contrast  08/03/2015  CLINICAL DATA:  Altered mental status EXAM: CT HEAD WITHOUT CONTRAST TECHNIQUE: Contiguous axial images were obtained from the base of the skull through the vertex without intravenous contrast. COMPARISON:  10/10/2011 FINDINGS: Brain parenchyma and ventricular system are within normal limits no mass effect, midline shift, or acute hemorrhage. Mastoid air cells are clear. Minimal  mucosal thickening in the right maxillary sinus. Intact cranium. IMPRESSION: No acute intracranial pathology. Electronically Signed   By: Marybelle Killings M.D.   On: 08/03/2015 13:27   Dg Chest Port 1 View  08/03/2015  CLINICAL DATA:  Altered mental status, history of diabetes EXAM: PORTABLE CHEST 1 VIEW COMPARISON:  12/03/2013 FINDINGS: Cardiomediastinal silhouette is stable. No acute infiltrate or pleural effusion. No pulmonary edema. Bony thorax is unremarkable. IMPRESSION: No active disease. Electronically Signed   By: Lahoma Crocker M.D.   On: 08/03/2015 12:53   I have personally reviewed and evaluated these images and lab results as part of my medical decision-making.   EKG Interpretation   Date/Time:  Tuesday August 03 2015 12:17:58 EST Ventricular Rate:  82 PR Interval:  188 QRS Duration: 75 QT Interval:  409 QTC Calculation: 478 R Axis:   55 Text Interpretation:  Sinus rhythm Multiple premature complexes, vent &  supraven Consider left ventricular hypertrophy Anterior Q waves, possibly  due to LVH Confirmed by RAY MD, Andee Poles [phone]) on 08/03/2015 2:43:00 PM      MDM   Patient is a 55 year old male with history of IDDM, polysubstance abuse, hypertension, right AKA, left BKA, he was found in his home at approximately 11 AM, unresponsive and slumped over.  CBG was initially 30, improved to 43 after he received 1 amp of D50 by EMS.  He presented the ER with eyes open, moaning and coughing, responding only to pain.  He was given a  second amp of D50 and 0.4 mg of Narcan.  Workup initiated for hypoglycemia/altered mental status.    Pt sugar improved to 165 @ 1306, he was then given 0.4 Narcan, without much improvement. He gradually developed decorticate posturing, worsening between 1 and 3 pm, Dr. Jeanell Sparrow personally saw and evaluated to pt.  2 mg of Ativan was administered with resolution of posturing, however pt has had no improvement with responsiveness with all tx.  Labs are pertinent for  leukocytosis of 16.8, troponin of 0.04, lactic acid 3.21, urine positive for cocaine CT head negative for acute intracranial pathology, portable chest is negative for infiltrate, pleural effusion, pulmonary edema ABG obtained by Dr. Jeanell Sparrow, pO2 71, normal pH and pCO2 - Pt has intact gag reflex  Dr. Cheral Marker from Neurology consulted for possible seizure/AMS - ddx   Pt continues to drop blood sugars, @ 1603 CBG 43, and an additional amp of D50 ordered with D10 C-peptide added, pt may have Lantus OD?  ETOH intoxication/withdrawal?  Cocaine use may have a factor, also may have new onset seizure.  Mr. Mcmanigal is a pt of family practice, they were called for admission, they requested repeat lactic acid.  If lactic acid is trending down, they need to be repaged and will admit to SDU.  If lactic acid remains elevated, critical care will need to be paged for admission.  Repeat lactic acid pending 4:53 PM Pt signed out to oncoming EDP for dispo after repeat lactic acid. Dr. Leonette Monarch will assist with dispo as needed.  4:59 PM Lactic acid clearing, family practice to admit, repaged Pt admitted to SDU by Dr. Gwendlyn Deutscher and family medicine residents  Final diagnoses:  Altered mental status, unspecified altered mental status type     Delsa Grana, PA-C 08/03/15 1700  Delsa Grana, PA-C 08/03/15 1710  Pattricia Boss, MD 08/04/15 1248

## 2015-08-03 NOTE — H&P (Signed)
[facility] Admission History and Physical Service Pager: [phone]  Patient name: Steven [name] Medical record number: [id number] Date of birth: June 10, 1961 Age: 55 y.o. Gender: male  Primary Care Provider: Chrisandra Netters, MD Consultants: Neurology Code Status: Full  Chief Complaint: Altered Mental Status  Assessment and Plan: Steven [name] is a 55 y.o. male presenting with altered mental status. PMH is significant for DM, HTN, atrial fibrillation, polysubstance abuse (cocaine, tobacco, alcohol), h/o DVT, PVD, AKA on right, BKA on left  # Altered Mental Status:  Found unconscious at home. GCS score 9. Hypoglycemia managed as below, suspected to be contributing. Narcan given in ED. Decorticate posturing noted in ED, resolved with 104m of Ativan. Leukocytosis of 16.8 noted. Troponin 0.04. Lactic Acid 3.21 initially, improved to 1.98. Ammonia normal at 30. ABG with pO2 71, pH 7.4, pCO2 36.4, bicarb 22.6. CK normal. Urine positive for cocaine. CT head negative. CXR negative. Neurology consulted in ED. Many etiologies in differential including Lantus overdose, EtOH intoxication/withdrawal, cocaine use, seizures, infection, meningitis. SIRS criteria met with HR, respiratory rate, leukocytosis, and initial lactic acidosis. qSOFA of 1 due to altered mental status. - Admit to Step Down Unit, [place] attending - Monitor vitals - NPO pending improved mental status - Neuro checks - Trend troponins - Follow up TSH - Follow up blood cultures, urine culture - [place] via ED team - Follow up EEG results - Follow up MRI - Neurology consulted, appreciate recommendations - Acetaminophen suppository PRN fever - Continue Vancomycin, Zosyn (Day #1) due to SIRS and qSOFA. If cultures return as normal or etiology of altered mental status presents itself, consider discontinuing.   # Hypoglycemia: History of insulin dependent diabetes. Suspected to be contributing to altered  mental status. Hypoglycemia noted with initial CBG 30--given 1amp of D50 prior to arrival to ED. Another two amps of D50 given and initiated on D10. Last A1C 11.2 on 04/14/15. Prescribed Lantus 45units at night and 10 units with meals. Prescribed Gabapentin 8037mTID. - Hold diabetic medications - Monitor CBG - Transition to D5 1/2 NS - Consider scheduled sliding scale if hyperglycemia noted.   # HTN: Prescribed Lisinopril 208m- Holding oral antihypertensives. - Hydralazine PRN  # Atrial Fibrillation:  Currently prescribed Diltiazem 120m72mily, Xarelto 20mg79md Aspirin 81mg.32montacted Cardiology concerning Diltiazem. Will hold at this time. Recommend Metoprolol IV if needed for HR control, however use with caution given Cocaine positive - Anticoagulation with Lovenox - Restart oral home medications when able  # History of Alcohol Use/Illicit Drug Use: UDS positive for cocaine.  - CIWA protocol  FEN/GI: NPO, D5 1/2 NS _0  Prophylaxis: Lovenox  Disposition: Home pending improvement of mental status  History of Present Illness:  Steven ELIZEO RODRIQUES54 y.o46male presenting with altered mental status.  History limited given altered mental status of patient. No family present.  Found at home at 11am unresponsive by home health aide. Last seen normal yesterday, 08/02/15. Was behaving normally yesterday. EMS called. Initial CBG of 30 noted by EMT and given 1 am of D50 by EMS. Another two amps of D50 given in ED along with Narcan. Initiated on D10 with stabilization of blood sugar. Decorticate posturing noted in ED, resolved with 2mg of20mivan. Admitting to hospital for further evaluation of altered mental status.  Review Of Systems: Per HPI Otherwise the remainder of the systems were negative.  Patient Active Problem List   Diagnosis Date Noted  . Patient cannot afford  medications 06/04/2015  . Paroxysmal a-fib ([place])   . Adjustment disorder with depressed mood   . History of left  below knee amputation ([place]) 04/22/2015  . Unilateral complete AKA ([place]) 04/19/2015  . Dry gangrene ([place])   . Right leg pain 04/14/2015  . Cough 08/12/2014  . Bilateral hand pain 05/29/2014  . Acne 03/30/2014  . S/P bilateral BKA (below knee amputation) ([place]) 01/22/2014  . Cocaine abuse 12/05/2013  . Tobacco abuse 12/04/2013  . Peripheral vascular disease ([place])   . Atrial fibrillation ([place]) 12/03/2013  . 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 History of colonic polyps 05/29/2013  . Diabetic retinopathy ([place]) 12/25/2012  . HLD (hyperlipidemia) 12/08/2012  . Peripheral neuropathy ([place]) 11/19/2011  . ETOH abuse 03/29/2011  . HTN (hypertension) 12/22/2010  . Diabetes mellitus type II, uncontrolled ([place]) 07/18/2006  . VENOUS INSUFFICIENCY 07/18/2006    Past Medical History: Past Medical History  Diagnosis Date  . Uncontrolled diabetes mellitus ([place])   . HTN (hypertension)   . History of DVT (deep vein thrombosis)   . Personal history of diabetic foot ulcer   . Peripheral vascular disease ([place])     a. Abnl ABIs 2014.  Marland Kitchen Peripheral neuropathy ([place]) 11/19/2011  . Varicose vein     legs  . Osteomyelitis of foot, right, acute ([place]) 08/30/2012  . Personal history of colonic adenomas 05/29/2013  . History of cocaine use   . PAF (paroxysmal atrial fibrillation) ([place])     a. Dx 11/2013 in setting of sepsis/foot infection.  . Tobacco abuse   . H/O ETOH abuse   . Dysrhythmia     afib last admission  . Seasonal allergies   . Adjustment disorder with depressed mood     Past Surgical History: Past Surgical History  Procedure Laterality Date  . Amputation  04/21/2011    Procedure: AMPUTATION RAY;  Surgeon: Newt Minion, MD;  Location: [place];  Service: Orthopedics;  Laterality: Right;  Rt foot 2nd ray ampt  . I&d extremity  05/03/2011    Procedure: IRRIGATION AND DEBRIDEMENT EXTREMITY;  Surgeon: Newt Minion, MD;  Location: [place];  Service: Orthopedics;  Laterality: Right;  Irrigation and  Debridement Right Foot and Place Acell Xenograft  . Toe amputation  04/24/2012    great toe   right foot  . Amputation  04/24/2012    Procedure: AMPUTATION RAY;  Surgeon: Newt Minion, MD;  Location: [place];  Service: Orthopedics;  Laterality: Right;  Right foot 1st ray amputation  . Amputation Left 04/23/2013    Procedure: AMPUTATION RAY;  Surgeon: Newt Minion, MD;  Location: [place];  Service: Orthopedics;  Laterality: Left;  Left Foot 5th Ray Amputation  . Colonoscopy    . Amputation Left 11/21/2013    Procedure: AMPUTATION RAY;  Surgeon: Newt Minion, MD;  Location: [place];  Service: Orthopedics;  Laterality: Left;  Left Foot 1st & 2nd Ray Amputation  . Amputation Right 12/04/2013    Procedure: AMPUTATION BELOW KNEE;  Surgeon: Marianna Payment, MD;  Location: [place];  Service: Orthopedics;  Laterality: Right;  . Amputation Left 12/29/2013    Procedure: LEFT AMPUTATION BELOW KNEE;  Surgeon: Marianna Payment, MD;  Location: [facility];  Service: Orthopedics;  Laterality: Left;  . Amputation Right 04/15/2015    Procedure: AMPUTATION ABOVE KNEE, RIGHT;  Surgeon: Newt Minion, MD;  Location: [facility];  Service: Orthopedics;  Laterality: Right;    Social History: Social History  Substance Use Topics  .  Smoking status: Current Every Day Smoker -- 0.50 packs/day for 30 years    Types: Cigarettes    Start date: 06/13/1983  . Smokeless tobacco: Never Used  . Alcohol Use: 3.0 - 3.6 oz/week    5-6 Standard drinks or equivalent per week     Comment: t states he hs rank any alcholol rcently.  drinks off and on   Please also refer to relevant sections of EMR.  Family History: Family History  Problem Relation Age of Onset  . Diabetes Mother   . Stroke Brother   . Colon cancer Neg Hx     Allergies and Medications: No Known Allergies No current facility-administered medications on file prior to encounter.   Current Outpatient Prescriptions on File Prior to Encounter  Medication Sig Dispense  Refill  . acetaminophen (TYLENOL) 325 MG tablet Take 2 tablets (650 mg total) by mouth every 6 (six) hours as needed. (Patient taking differently: Take 650 mg by mouth every 6 (six) hours as needed for mild pain. ) 60 tablet 1  . cetirizine (ZYRTEC) 10 MG tablet Take 1 tablet (10 mg total) by mouth daily. 90 tablet 3  . gabapentin (NEURONTIN) 400 MG capsule Take 2 capsules (800 mg total) by mouth 3 (three) times daily. 180 capsule 0  . insulin aspart (NOVOLOG) 100 UNIT/ML injection Inject 10 units daily with meals. 1 vial 3  . insulin glargine (LANTUS) 100 UNIT/ML injection Inject 45 units nightly. 10 mL 6  . iron polysaccharides (NIFEREX) 150 MG capsule Take 1 capsule (150 mg total) by mouth daily. 30 capsule 0  . lisinopril (PRINIVIL,ZESTRIL) 20 MG tablet take 1 tablet by mouth once daily for blood pressure 90 tablet 2  . methocarbamol (ROBAXIN) 500 MG tablet Take 1 tablet (500 mg total) by mouth every 6 (six) hours as needed for muscle spasms. 90 tablet 0  . Multiple Vitamin (MULTIVITAMIN WITH MINERALS) TABS tablet Take 1 tablet by mouth daily. 30 tablet 0  . rivaroxaban (XARELTO) 20 MG TABS tablet Take 1 tablet (20 mg total) by mouth daily with supper. For Atrial Fibrillation 90 tablet 3  . traMADol (ULTRAM) 50 MG tablet Take 1 tablet (50 mg total) by mouth 4 (four) times daily -  before meals and at bedtime. 120 tablet 0  . aspirin EC 81 MG tablet Take 2 tablets (162 mg total) by mouth daily. (Patient not taking: Reported on 08/03/2015) 60 tablet 0  . atorvastatin (LIPITOR) 40 MG tablet Take 1 tablet (40 mg total) by mouth daily. (Patient not taking: Reported on 04/14/2015) 30 tablet 11  . diltiazem (CARDIZEM CD) 120 MG 24 hr capsule Take 1 capsule (120 mg total) by mouth daily. (Patient not taking: Reported on 08/03/2015) 90 capsule 3  . fluticasone (FLONASE) 50 MCG/ACT nasal spray Place 2 sprays into both nostrils daily. (Patient not taking: Reported on 67/11/1950) 16 g 1  . folic acid (FOLVITE) 1  MG tablet Take 1 tablet (1 mg total) by mouth daily. (Patient not taking: Reported on 04/14/2015) 30 tablet 1  . nicotine (NICODERM CQ - DOSED IN MG/24 HOURS) 21 mg/24hr patch Place 1 patch (21 mg total) onto the skin daily. (Patient not taking: Reported on 08/03/2015) 28 patch 0  . oxyCODONE 10 MG TABS Take 0.5-1 tablets (5-10 mg total) by mouth every 6 (six) hours as needed for severe pain. (Patient not taking: Reported on 08/03/2015) 90 tablet 0  . pantoprazole (PROTONIX) 40 MG tablet Take 1 tablet (40 mg total) by mouth at  bedtime. (Patient not taking: Reported on 04/14/2015) 90 tablet 1  . polyethylene glycol (MIRALAX / GLYCOLAX) packet Take 17 g by mouth daily as needed. For constipation (Patient not taking: Reported on 08/03/2015) 14 each 0  . thiamine 100 MG tablet Take 1 tablet (100 mg total) by mouth daily. (Patient not taking: Reported on 08/03/2015) 30 tablet 0    Objective: BP 144/77 mmHg  Pulse 81  Temp(Src) 98.3 F (36.8 C) (Rectal)  Resp 22  Ht _0  (1.93 m)  Wt 201 lb 15.1 oz (91.6 kg)  BMI 24.59 kg/m2  SpO2 99% Exam: General: 55yo male resting comfortably in no apparent distress, not conversing due to altered mental status Eyes: Pupils constricted but reactive. Doll's reflex intact. ENTM: moist mucous membranes Neck: supple, nontender, no meningeal signs apparent Cardiovascular: S1 and S2 noted, regular rate and rhythm Respiratory: Upper respiratory congestion noted with auscultation. Listened anteriorly with equal breath sounds bilaterally Abdomen: normal bowel sounds, soft and nondistended, nontender MSK: no edema noted, right aka, left bka Skin: wound noted on right stump in process of healing, bandage in place Neuro: Responsive to pain but not sound. Opens eye to pain and with coughing or movement of limbs. Groans with sternal rub. GCS 9. CN exam deferred given mental status.  Psych: Not alert or oriented at this time.   Labs and Imaging: CBC BMET   Recent Labs Lab  08/03/15 1349 08/03/15 1657  WBC 16.8*  --   HGB 17.5* 18.4*  HCT 53.4* 54.0*  PLT 234  --     Recent Labs Lab 08/03/15 1349 08/03/15 1657  NA 136 140  K 3.9 3.5  CL 101 101  CO2 23  --   BUN 13 14  CREATININE 0.80 0.60*  GLUCOSE 120* 111*  CALCIUM 10.1  --   Calcium 1.17 Albumin 3 Ammonia 30 AST 27 ALT 17 Alkaline Phosphatase 79   Urinalysis    Component Value Date/Time   COLORURINE YELLOW 08/03/2015 1230   APPEARANCEUR CLEAR 08/03/2015 1230   LABSPEC 1.019 08/03/2015 1230   PHURINE 7.5 08/03/2015 1230   GLUCOSEU >1000* 08/03/2015 1230   HGBUR MODERATE* 08/03/2015 1230   BILIRUBINUR NEGATIVE 08/03/2015 1230   KETONESUR NEGATIVE 08/03/2015 1230   PROTEINUR >300* 08/03/2015 1230   UROBILINOGEN 1.0 04/20/2015 0010   NITRITE NEGATIVE 08/03/2015 1230   LEUKOCYTESUR NEGATIVE 08/03/2015 1230  - CK 183 - Troponin 0.04 - Lactic Acid > 1.98 - EtOH <5 - UDS with cocaine  Ct Head Wo Contrast  08/03/2015  CLINICAL DATA:  Altered mental status EXAM: CT HEAD WITHOUT CONTRAST TECHNIQUE: Contiguous axial images were obtained from the base of the skull through the vertex without intravenous contrast. COMPARISON:  10/10/2011 FINDINGS: Brain parenchyma and ventricular system are within normal limits no mass effect, midline shift, or acute hemorrhage. Mastoid air cells are clear. Minimal mucosal thickening in the right maxillary sinus. Intact cranium. IMPRESSION: No acute intracranial pathology. Electronically Signed   By: Marybelle Killings M.D.   On: 08/03/2015 13:27   Dg Chest Port 1 View  08/03/2015  CLINICAL DATA:  Altered mental status, history of diabetes EXAM: PORTABLE CHEST 1 VIEW COMPARISON:  12/03/2013 FINDINGS: Cardiomediastinal silhouette is stable. No acute infiltrate or pleural effusion. No pulmonary edema. Bony thorax is unremarkable. IMPRESSION: No active disease. Electronically Signed   By: Lahoma Crocker M.D.   On: 08/03/2015 12:53    Lorna Few, DO 08/03/2015, 5:09  PM PGY-2, [facility] pager:  [phone], text pages welcome

## 2015-08-04 ENCOUNTER — Inpatient Hospital Stay (HOSPITAL_COMMUNITY): Payer: Medicaid Other

## 2015-08-04 ENCOUNTER — Encounter (HOSPITAL_COMMUNITY): Payer: Self-pay | Admitting: Emergency Medicine

## 2015-08-04 DIAGNOSIS — G0490 Encephalitis and encephalomyelitis, unspecified: Secondary | ICD-10-CM | POA: Insufficient documentation

## 2015-08-04 DIAGNOSIS — F1910 Other psychoactive substance abuse, uncomplicated: Secondary | ICD-10-CM

## 2015-08-04 DIAGNOSIS — E162 Hypoglycemia, unspecified: Secondary | ICD-10-CM

## 2015-08-04 DIAGNOSIS — R509 Fever, unspecified: Secondary | ICD-10-CM | POA: Insufficient documentation

## 2015-08-04 DIAGNOSIS — R911 Solitary pulmonary nodule: Secondary | ICD-10-CM | POA: Diagnosis not present

## 2015-08-04 DIAGNOSIS — R000 Tachycardia, unspecified: Secondary | ICD-10-CM | POA: Insufficient documentation

## 2015-08-04 DIAGNOSIS — R569 Unspecified convulsions: Secondary | ICD-10-CM

## 2015-08-04 DIAGNOSIS — R4182 Altered mental status, unspecified: Secondary | ICD-10-CM

## 2015-08-04 LAB — MRSA PCR SCREENING: MRSA by PCR: NEGATIVE

## 2015-08-04 LAB — COMPREHENSIVE METABOLIC PANEL
ALT: 13 U/L — AB (ref 17–63)
AST: 18 U/L (ref 15–41)
Albumin: 2.1 g/dL — ABNORMAL LOW (ref 3.5–5.0)
Alkaline Phosphatase: 60 U/L (ref 38–126)
Anion gap: 12 (ref 5–15)
BUN: 9 mg/dL (ref 6–20)
CHLORIDE: 104 mmol/L (ref 101–111)
CO2: 22 mmol/L (ref 22–32)
CREATININE: 0.98 mg/dL (ref 0.61–1.24)
Calcium: 9 mg/dL (ref 8.9–10.3)
Glucose, Bld: 115 mg/dL — ABNORMAL HIGH (ref 65–99)
Potassium: 3.5 mmol/L (ref 3.5–5.1)
Sodium: 138 mmol/L (ref 135–145)
TOTAL PROTEIN: 5.7 g/dL — AB (ref 6.5–8.1)
Total Bilirubin: 0.2 mg/dL — ABNORMAL LOW (ref 0.3–1.2)

## 2015-08-04 LAB — GLUCOSE, CAPILLARY
GLUCOSE-CAPILLARY: 106 mg/dL — AB (ref 65–99)
GLUCOSE-CAPILLARY: 96 mg/dL (ref 65–99)
GLUCOSE-CAPILLARY: 78 mg/dL (ref 65–99)
GLUCOSE-CAPILLARY: 90 mg/dL (ref 65–99)
GLUCOSE-CAPILLARY: 82 mg/dL (ref 65–99)
GLUCOSE-CAPILLARY: 100 mg/dL — AB (ref 65–99)
Glucose-Capillary: 103 mg/dL — ABNORMAL HIGH (ref 65–99)
Glucose-Capillary: 131 mg/dL — ABNORMAL HIGH (ref 65–99)
Glucose-Capillary: 114 mg/dL — ABNORMAL HIGH (ref 65–99)
Glucose-Capillary: 109 mg/dL — ABNORMAL HIGH (ref 65–99)

## 2015-08-04 LAB — TSH: TSH: 0.625 u[IU]/mL (ref 0.350–4.500)

## 2015-08-04 LAB — CBG MONITORING, ED: Glucose-Capillary: 101 mg/dL — ABNORMAL HIGH (ref 65–99)

## 2015-08-04 LAB — APTT
APTT: 35 s (ref 24–37)
APTT: 24 s (ref 24–37)
APTT: 46 s — AB (ref 24–37)
aPTT: 36 seconds (ref 24–37)

## 2015-08-04 LAB — CBC
HCT: 45 % (ref 39.0–52.0)
Hemoglobin: 14.8 g/dL (ref 13.0–17.0)
MCH: 27.9 pg (ref 26.0–34.0)
MCHC: 32.9 g/dL (ref 30.0–36.0)
MCV: 84.7 fL (ref 78.0–100.0)
PLATELETS: 216 10*3/uL (ref 150–400)
RBC: 5.31 MIL/uL (ref 4.22–5.81)
RDW: 16.3 % — ABNORMAL HIGH (ref 11.5–15.5)
WBC: 11 10*3/uL — ABNORMAL HIGH (ref 4.0–10.5)

## 2015-08-04 LAB — CRYPTOCOCCAL ANTIGEN: Crypto Ag: NEGATIVE

## 2015-08-04 LAB — C-PEPTIDE: C PEPTIDE: 0.6 ng/mL — AB (ref 1.1–4.4)

## 2015-08-04 LAB — TROPONIN I
TROPONIN I: 0.07 ng/mL — AB (ref <0.031)
TROPONIN I: 0.05 ng/mL — AB (ref <0.031)

## 2015-08-04 LAB — PROTIME-INR
INR: 1.05 (ref 0.00–1.49)
Prothrombin Time: 13.9 seconds (ref 11.6–15.2)

## 2015-08-04 LAB — HEPARIN LEVEL (UNFRACTIONATED): Heparin Unfractionated: 0.6 IU/mL (ref 0.30–0.70)

## 2015-08-04 LAB — MAGNESIUM
MAGNESIUM: 1.4 mg/dL — AB (ref 1.7–2.4)
Magnesium: 1.9 mg/dL (ref 1.7–2.4)

## 2015-08-04 LAB — D-DIMER, QUANTITATIVE: D-Dimer, Quant: 1.14 ug/mL-FEU — ABNORMAL HIGH (ref 0.00–0.50)

## 2015-08-04 MED ORDER — HEPARIN (PORCINE) IN NACL 100-0.45 UNIT/ML-% IJ SOLN
1350.0000 [IU]/h | INTRAMUSCULAR | Status: DC
  Administered 2015-08-04: 1350 [IU]/h via INTRAVENOUS
  Filled 2015-08-04: qty 250

## 2015-08-04 MED ORDER — DEXTROSE 5 % IV SOLN
900.0000 mg | Freq: Three times a day (TID) | INTRAVENOUS | Status: DC
  Administered 2015-08-04: 900 mg via INTRAVENOUS
  Filled 2015-08-04 (×3): qty 18

## 2015-08-04 MED ORDER — DEXTROSE 50 % IV SOLN
1.0000 | Freq: Once | INTRAVENOUS | Status: AC
  Administered 2015-08-04: 50 mL via INTRAVENOUS
  Filled 2015-08-04: qty 50

## 2015-08-04 MED ORDER — CHLORHEXIDINE GLUCONATE 0.12 % MT SOLN
15.0000 mL | Freq: Two times a day (BID) | OROMUCOSAL | Status: DC
  Administered 2015-08-04 – 2015-08-12 (×13): 15 mL via OROMUCOSAL
  Filled 2015-08-04 (×14): qty 15

## 2015-08-04 MED ORDER — CETYLPYRIDINIUM CHLORIDE 0.05 % MT LIQD
7.0000 mL | Freq: Two times a day (BID) | OROMUCOSAL | Status: DC
  Administered 2015-08-05 – 2015-08-11 (×10): 7 mL via OROMUCOSAL

## 2015-08-04 MED ORDER — MAGNESIUM SULFATE 2 GM/50ML IV SOLN
2.0000 g | Freq: Once | INTRAVENOUS | Status: AC
  Administered 2015-08-04: 2 g via INTRAVENOUS
  Filled 2015-08-04: qty 50

## 2015-08-04 MED ORDER — HEPARIN (PORCINE) IN NACL 100-0.45 UNIT/ML-% IJ SOLN
1850.0000 [IU]/h | INTRAMUSCULAR | Status: DC
  Administered 2015-08-04: 1350 [IU]/h via INTRAVENOUS
  Administered 2015-08-05: 1550 [IU]/h via INTRAVENOUS
  Administered 2015-08-05 – 2015-08-06 (×2): 1650 [IU]/h via INTRAVENOUS
  Administered 2015-08-06: 11:00:00 via INTRAVENOUS
  Administered 2015-08-07 – 2015-08-08 (×2): 1650 [IU]/h via INTRAVENOUS
  Administered 2015-08-09: 1850 [IU]/h via INTRAVENOUS
  Filled 2015-08-04 (×7): qty 250

## 2015-08-04 MED ORDER — IOHEXOL 350 MG/ML SOLN
100.0000 mL | Freq: Once | INTRAVENOUS | Status: AC | PRN
  Administered 2015-08-04: 70 mL via INTRAVENOUS

## 2015-08-04 MED ORDER — COLLAGENASE 250 UNIT/GM EX OINT
TOPICAL_OINTMENT | Freq: Every day | CUTANEOUS | Status: DC
  Administered 2015-08-05 – 2015-08-12 (×7): via TOPICAL
  Filled 2015-08-04 (×3): qty 30

## 2015-08-04 NOTE — Assessment & Plan Note (Signed)
Noted on CTA on 08/04/15. Nodular opacity in L upper lobe. Suggest repeat imaging in 3-6 months to rule out malignancy.

## 2015-08-04 NOTE — Progress Notes (Signed)
ANTICOAGULATION CONSULT NOTE - Follow Up Consult  Pharmacy Consult for heparin  Indication: PE  No Known Allergies  Patient Measurements: Height: 6\' 4"  (193 cm) Weight: 201 lb 15.1 oz (91.6 kg) IBW/kg (Calculated) : 86.8  Vital Signs: Temp: 98.2 F (36.8 C) (02/22 1341) Temp Source: Axillary (02/22 1341) BP: 135/95 mmHg (02/22 1140) Pulse Rate: 50 (02/22 1140)  Labs:  Recent Labs  08/03/15 1349 08/03/15 1657 08/03/15 2237 08/04/15 0228 08/04/15 0828  HGB 17.5* 18.4*  --  14.8  --   HCT 53.4* 54.0*  --  45.0  --   PLT 234  --   --  216  --   APTT  --   --   --   --  35  HEPARINUNFRC  --   --   --   --  0.60  CREATININE 0.80 0.60*  --  0.98  --   CKTOTAL 183  --   --   --   --   TROPONINI 0.04*  --  0.05* 0.07* 0.05*    Estimated Creatinine Clearance: 103.3 mL/min (by C-G formula based on Cr of 0.98).   Medications:  Scheduled:  . acyclovir  900 mg Intravenous Q8H  . ampicillin (OMNIPEN) IV  2 g Intravenous 6 times per day  . cefTRIAXone (ROCEPHIN)  IV  2 g Intravenous Q12H  . collagenase   Topical Daily  . folic acid  1 mg Intravenous Daily  . sodium chloride  1,000 mL Intravenous Once  . sodium chloride flush  3 mL Intravenous Q12H  . thiamine  100 mg Intravenous Daily  . vancomycin  1,000 mg Intravenous Q8H    Assessment: 55 yo  Male with PE and r/o meningitis. Heparin was stopped earlier  for an LP but this is not able to be completed. Patient to restart heparin.  Goal of Therapy:  Heparin level 0.3-0.7 units/ml Monitor platelets by anticoagulation protocol: Yes   Plan:  -Restart heparin at 1350 units/hr -aPTT in 6 hrs -Daily heparin level and CBC  Hildred Laser, Pharm D 08/04/2015 1:59 PM

## 2015-08-04 NOTE — Progress Notes (Signed)
Went to evaluate Steven [name] after arrival on floor. Mental status largely unchanged from previous exam. Reacts to pain. Dolls eye reflex intact. Continue IV antibiotics for possible meningitis. Droplet precautions. Diagnostic Radiology to do Lumbar puncture in am. Neurology consulted and recommendations appreciated. Follow up EEG. Vitals stable.  Dr. Gerlean Ren 08/04/15, 2:14 AM

## 2015-08-04 NOTE — ED Notes (Signed)
CBG 101 

## 2015-08-04 NOTE — Progress Notes (Signed)
Pharmacy Antibiotic Note  Steven [name] is a 55 y.o. male with AMS with possible meningitis on ampicillin, rocephin and vancomycin.  Pharmacy has been consulted for acyclovir dosing to cover for possible HSV.  -WBC= 11, afeb, CrCl ~ 100  Plan: -Acyclovir 900mg  IV q8h -Will follow renal function, cultures and clinical progress   Height: 6\' 4"  (193 cm) Weight: 201 lb 15.1 oz (91.6 kg) IBW/kg (Calculated) : 86.8  Temp (24hrs), Avg:99.6 F (37.6 C), Min:98 F (36.7 C), Max:101.9 F (38.8 C)   Recent Labs Lab 08/03/15 1349 08/03/15 1432 08/03/15 1657 08/03/15 2245 08/04/15 0228  WBC 16.8*  --   --   --  11.0*  CREATININE 0.80  --  0.60*  --  0.98  LATICACIDVEN  --  3.21* 1.98 0.96  --     Estimated Creatinine Clearance: 103.3 mL/min (by C-G formula based on Cr of 0.98).    No Known Allergies  Antimicrobials this admission: 2/22 acyclovir 2/22 Ampicillin >> 2/22 Rocephin 2>> 2/21 Vanco>> 2/21 Zosyn >>2/21  Dose adjustments this admission: n/a  Microbiology results: 2/21 blood x2 2/21 urine  Thank you for allowing pharmacy to be a part of this patient's care.   Hildred Laser, Pharm D 08/04/2015 11:34 AM

## 2015-08-04 NOTE — Progress Notes (Signed)
ANTICOAGULATION CONSULT NOTE - Initial Consult  Pharmacy Consult for Heparin Indication: pulmonary embolus; h/o DVT; afib  No Known Allergies  Patient Measurements: Height: 6\' 4"  (193 cm) Weight: 201 lb 15.1 oz (91.6 kg) IBW/kg (Calculated) : 86.8 Heparin Dosing Weight: 92 kg  Vital Signs: Temp: 98.1 F (36.7 C) (02/22 0332) Temp Source: Axillary (02/22 0332) BP: 161/88 mmHg (02/22 0335) Pulse Rate: 86 (02/22 0335)  Labs:  Recent Labs  08/03/15 1349 08/03/15 1657 08/03/15 2237 08/04/15 0228  HGB 17.5* 18.4*  --  14.8  HCT 53.4* 54.0*  --  45.0  PLT 234  --   --  216  CREATININE 0.80 0.60*  --  0.98  CKTOTAL 183  --   --   --   TROPONINI 0.04*  --  0.05* 0.07*    Estimated Creatinine Clearance: 103.3 mL/min (by C-G formula based on Cr of 0.98).   Medical History: Past Medical History  Diagnosis Date  . Uncontrolled diabetes mellitus ([place])   . HTN (hypertension)   . History of DVT (deep vein thrombosis)   . Personal history of diabetic foot ulcer   . Peripheral vascular disease ([place])     a. Abnl ABIs 2014.  Marland Kitchen Peripheral neuropathy ([place]) 11/19/2011  . Varicose vein     legs  . Osteomyelitis of foot, right, acute ([place]) 08/30/2012  . Personal history of colonic adenomas 05/29/2013  . History of cocaine use   . PAF (paroxysmal atrial fibrillation) ([place])     a. Dx 11/2013 in setting of sepsis/foot infection.  . Tobacco abuse   . H/O ETOH abuse   . Dysrhythmia     afib last admission  . Seasonal allergies   . Adjustment disorder with depressed mood     Medications:  Prescriptions prior to admission  Medication Sig Dispense Refill Last Dose  . acetaminophen (TYLENOL) 325 MG tablet Take 2 tablets (650 mg total) by mouth every 6 (six) hours as needed. (Patient taking differently: Take 650 mg by mouth every 6 (six) hours as needed for mild pain. ) 60 tablet 1 Past Month at Unknown time  . cetirizine (ZYRTEC) 10 MG tablet Take 1 tablet (10 mg total) by mouth daily.  90 tablet 3 08/02/2015 at Unknown time  . cholecalciferol (VITAMIN D) 1000 units tablet Take 1,000 Units by mouth daily.   Past Week at Unknown time  . doxycycline (VIBRAMYCIN) 100 MG capsule Take 100 mg by mouth 2 (two) times daily.   08/02/2015 at Unknown time  . gabapentin (NEURONTIN) 400 MG capsule Take 2 capsules (800 mg total) by mouth 3 (three) times daily. 180 capsule 0 unk at unk  . insulin aspart (NOVOLOG) 100 UNIT/ML injection Inject 10 units daily with meals. 1 vial 3 unk at Unknown time  . insulin glargine (LANTUS) 100 UNIT/ML injection Inject 45 units nightly. 10 mL 6 unk at Unknown time  . iron polysaccharides (NIFEREX) 150 MG capsule Take 1 capsule (150 mg total) by mouth daily. 30 capsule 0 08/02/2015 at Unknown time  . lisinopril (PRINIVIL,ZESTRIL) 20 MG tablet take 1 tablet by mouth once daily for blood pressure 90 tablet 2 08/02/2015 at Unknown time  . methocarbamol (ROBAXIN) 500 MG tablet Take 1 tablet (500 mg total) by mouth every 6 (six) hours as needed for muscle spasms. 90 tablet 0 Past Month at Unknown time  . Multiple Vitamin (MULTIVITAMIN WITH MINERALS) TABS tablet Take 1 tablet by mouth daily. 30 tablet 0 Past Month at Unknown time  .  rivaroxaban (XARELTO) 20 MG TABS tablet Take 1 tablet (20 mg total) by mouth daily with supper. For Atrial Fibrillation 90 tablet 3 08/02/2015 at Unknown time  . SSD 1 % cream Apply 1 application topically daily as needed. Rash/irritation  0 Past Week at Unknown time  . traMADol (ULTRAM) 50 MG tablet Take 1 tablet (50 mg total) by mouth 4 (four) times daily -  before meals and at bedtime. 120 tablet 0 Past Month at Unknown time  . aspirin EC 81 MG tablet Take 2 tablets (162 mg total) by mouth daily. (Patient not taking: Reported on 08/03/2015) 60 tablet 0 Not Taking at Unknown time  . atorvastatin (LIPITOR) 40 MG tablet Take 1 tablet (40 mg total) by mouth daily. (Patient not taking: Reported on 04/14/2015) 30 tablet 11 Not Taking at Unknown time  .  diltiazem (CARDIZEM CD) 120 MG 24 hr capsule Take 1 capsule (120 mg total) by mouth daily. (Patient not taking: Reported on 08/03/2015) 90 capsule 3 Not Taking at Unknown time  . fluticasone (FLONASE) 50 MCG/ACT nasal spray Place 2 sprays into both nostrils daily. (Patient not taking: Reported on 04/14/2015) 16 g 1 Not Taking at Unknown time  . folic acid (FOLVITE) 1 MG tablet Take 1 tablet (1 mg total) by mouth daily. (Patient not taking: Reported on 04/14/2015) 30 tablet 1 Not Taking at Unknown time  . nicotine (NICODERM CQ - DOSED IN MG/24 HOURS) 21 mg/24hr patch Place 1 patch (21 mg total) onto the skin daily. (Patient not taking: Reported on 08/03/2015) 28 patch 0 Not Taking  . oxyCODONE 10 MG TABS Take 0.5-1 tablets (5-10 mg total) by mouth every 6 (six) hours as needed for severe pain. (Patient not taking: Reported on 08/03/2015) 90 tablet 0 Not Taking at Unknown time  . pantoprazole (PROTONIX) 40 MG tablet Take 1 tablet (40 mg total) by mouth at bedtime. (Patient not taking: Reported on 04/14/2015) 90 tablet 1 Not Taking at Unknown time  . polyethylene glycol (MIRALAX / GLYCOLAX) packet Take 17 g by mouth daily as needed. For constipation (Patient not taking: Reported on 08/03/2015) 14 each 0 Not Taking at Unknown time  . thiamine 100 MG tablet Take 1 tablet (100 mg total) by mouth daily. (Patient not taking: Reported on 08/03/2015) 30 tablet 0 Not Taking at Unknown time    Assessment: 55 y.o. M found unresponsive at home by his aide. Pt with ETOH and cocaine abuse. Pt supposed to be on Xarelto PTA for afib, h/o DVT. Unsure when last dose was but latest it could have been is 2/21 a.m. since pt found down at that time. Now to begin heparin bridge. CT scan shows small pulmonary emboli in branches of the left lower lobe pulmonary artery. These small pulmonary emboli are nonobstructing. No more central pulmonary embolus. No demonstrable right heart strain.   Xarelto may still be affecting heparin level so  will get baseline heparin level and PTT. If elevated, will use PTT to monitor heparin  Goal of Therapy:  Heparin level 0.3-0.7 units/ml; aPTT 66-102 sec Monitor platelets by anticoagulation protocol: Yes   Plan:  D/c Lovenox Baseline heparin level and PTT -will f/u results to schedule future labs Heparin gtt at 1350 units/hr. No bolus Daily heparin level and CBC  Sherlon Handing, PharmD, BCPS Clinical pharmacist, pager [phone] 08/04/2015,7:35 AM

## 2015-08-04 NOTE — Discharge Summary (Signed)
[facility] Discharge Summary  Patient name: Steven [name] Medical record number: [id number] Date of birth: 1960/11/16 Age: 55 y.o. Gender: male Date of Admission: 08/03/2015  Date of Discharge: 08/12/15 Admitting Physician: Kinnie Feil, MD  Primary Care Provider: Chrisandra Netters, MD Consultants: neurology, ID, orthopedics  Indication for Hospitalization: altered mental status  Discharge Diagnoses/Problem List:  Patient Active Problem List   Diagnosis Date Noted  . Palliative care encounter   . Goals of care, counseling/discussion   . Dysphagia   . DNR (do not resuscitate)   . Stump pain ([place])   . Lung nodule seen on imaging study 08/04/2015  . Seizure ([place])   . Tachycardia   . FUO (fever of unknown origin)   . Meningoencephalitis   . Polysubstance abuse   . Hypoglycemia   . Altered mental status 08/03/2015  . Patient cannot afford medications 06/04/2015  . Paroxysmal a-fib ([place])   . Adjustment disorder with depressed mood   . History of left below knee amputation ([place]) 04/22/2015  . Unilateral complete AKA ([place]) 04/19/2015  . Dry gangrene ([place])   . Right leg pain 04/14/2015  . Cough 08/12/2014  . Bilateral hand pain 05/29/2014  . Acne 03/30/2014  . S/P bilateral BKA (below knee amputation) ([place]) 01/22/2014  . Cocaine abuse 12/05/2013  . Tobacco abuse 12/04/2013  . Peripheral vascular disease ([place])   . Atrial fibrillation ([place]) 12/03/2013  . 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 History of colonic polyps 05/29/2013  . Diabetic retinopathy ([place]) 12/25/2012  . HLD (hyperlipidemia) 12/08/2012  . Peripheral neuropathy ([place]) 11/19/2011  . ETOH abuse 03/29/2011  . HTN (hypertension) 12/22/2010  . Diabetes mellitus type II, uncontrolled ([place]) 07/18/2006  . VENOUS INSUFFICIENCY 07/18/2006   Disposition: SNF  Discharge Condition: Improved and stable  Discharge Exam:  Please refer to progress note from day of discharge   Brief  Hospital Course:  Patient presented with altered mental status with hypoglycemia.   Altered mental status/hypoglycemia Patient was found unresponsive at home by a home health aid on day of admission. Upon presentation to ED, patient was noted to have decorticate posturing, and began to seize. He was given 52m Ativan with resolution of symptoms. Initial GCS was 9. Patient met SIRS criteria with tachycardia, tachypnea, leukocytosis, and lactic acidosis. He was thus started on vanc and zosyn.   He was noted to have CBG of 30 upon arrival of EMS, which improved only minimally with administration of three amps of D50. CBG stabilized with initiation of D10.  Neurology was consulted, who felt that patient's altered mental status and prolonged unresponsiveness was due to either repeated episodes of extreme hypoglycemia, or a prolonged episode of hypoglycemia, with permanent brain damage.   ID was also consulted, who thought AMS was most likely not due to meningitis, and was more likely results of hypoglycemia with concomitant seizures, other metabolic derangements, or 2/2 to stump infection at site of R AKA. Abx were changed to vanc and CTX. MRI and CT of R femur were unable to be obtained due to patient's agitation, however plain film of R femur showed possible osteo vs necrotizing fasciitis. Patient was then seen by ortho, who felt this was not osteo or necrotizing fasciitis, and recommended continuing current antibiotic regimen and applying Santyl dressings to wound.   Patient slowly began to improve, intermittently opening eyes to name, however would not speak or follow commands. Palliative care was consulted to discuss goals of care with patient's sister, as he  was not expected to return to baseline. Patient's code status was subsequently changed to DNR. Sister did want to continue with medical care. Therefore, PICC line was placed to continue Vancomycin and Ceftriaxone to cover for possible meningitis and  osteomyelitis.   Multiple pulmonary emboli Patient was found to have multiple pulmonary emboli on CTA on second day of admission. He was started on heparin drip, and continued to oxygenate well on RA. As he remained stable without hypoxemia, he was transitioned to therapeutic dose Lovenox after 5 days of heparin drip.   Issues for Follow Up:  1. Lung nodule - f/u in 3-6 mon to rule out malignancy 2. Continue anticoagulation at discharge for PE for recommended period of time. Consider 6 months 3. Abx PICC line: Continue IV Vancomycin and Ceftriaxone for 4 more days starting 08/13/15 for empiric treatment for possible meningitis  4. Patient's diltiazem was increased from 120 mg to 180 mg for better HR and BP control. Additionally, HCTZ was added to his medication regimen. Would recommend checking BMET to ensure electrolytes stable with addition of new diuretic.  5. Patient was discharged with half his home dose of Lantus and Sensitive sliding scale with meals and at bedtime. Please titrate as needed.   Significant Procedures: none  Significant Labs and Imaging:   Recent Labs Lab 08/09/15 1310 08/10/15 0836 08/11/15 0642  WBC 4.9 5.3 5.8  HGB 14.3 14.6 14.1  HCT 43.7 44.0 43.9  PLT 213 214 249    Recent Labs Lab 08/06/15 0619 08/08/15 0443 08/09/15 1310 08/09/15 1315 08/10/15 0836  NA 139 141 140 139 141  K 3.5 2.8* 3.2* 3.2* 3.6  CL 110 110 109 109 112*  CO2 22 22 20* 19* 22  GLUCOSE 202* 166* 177* 176* 130*  BUN <5* <5* <5* <5* <5*  CREATININE 0.92 0.81 0.86 0.85 0.85  CALCIUM 8.8* 8.7* 8.9 8.9 9.1  ALKPHOS 62  --   --   --   --   AST 20  --   --   --   --   ALT 15*  --   --   --   --   ALBUMIN 2.2*  --   --   --   --     Results/Tests Pending at Time of Discharge: none  Discharge Medications:    Medication List    STOP taking these medications        doxycycline 100 MG capsule  Commonly known as:  VIBRAMYCIN     fluticasone 50 MCG/ACT nasal spray  Commonly  known as:  FLONASE     folic acid 1 MG tablet  Commonly known as:  FOLVITE     nicotine 21 mg/24hr patch  Commonly known as:  NICODERM CQ - dosed in mg/24 hours     Oxycodone HCl 10 MG Tabs     pantoprazole 40 MG tablet  Commonly known as:  PROTONIX     polyethylene glycol packet  Commonly known as:  MIRALAX / GLYCOLAX     rivaroxaban 20 MG Tabs tablet  Commonly known as:  XARELTO     thiamine 100 MG tablet     traMADol 50 MG tablet  Commonly known as:  ULTRAM      TAKE these medications        acetaminophen 325 MG tablet  Commonly known as:  TYLENOL  Take 2 tablets (650 mg total) by mouth every 6 (six) hours as needed.     aspirin EC 81  MG tablet  Take 2 tablets (162 mg total) by mouth daily.     atorvastatin 40 MG tablet  Commonly known as:  LIPITOR  Take 1 tablet (40 mg total) by mouth daily.     cefTRIAXone 2 g in dextrose 5 % 50 mL  Inject 2 g into the vein daily.  Start taking on:  08/13/2015     cetirizine 10 MG tablet  Commonly known as:  ZYRTEC  Take 1 tablet (10 mg total) by mouth daily.     cholecalciferol 1000 units tablet  Commonly known as:  VITAMIN D  Take 1,000 Units by mouth daily.     diltiazem 180 MG 24 hr capsule  Commonly known as:  CARDIZEM CD  Take 1 capsule (180 mg total) by mouth daily.  Start taking on:  08/13/2015     enoxaparin 80 MG/0.8ML injection  Commonly known as:  LOVENOX  Inject 0.8 mLs (80 mg total) into the skin every 12 (twelve) hours.     gabapentin 400 MG capsule  Commonly known as:  NEURONTIN  Take 2 capsules (800 mg total) by mouth 3 (three) times daily.     hydrochlorothiazide 12.5 MG capsule  Commonly known as:  MICROZIDE  Take 1 capsule (12.5 mg total) by mouth 2 (two) times daily.  Start taking on:  08/13/2015     insulin aspart 100 UNIT/ML injection  Commonly known as:  novoLOG  Inject 0-9 Units into the skin 3 (three) times daily with meals. Sensitive sliding scale     insulin glargine 100 UNIT/ML  injection  Commonly known as:  LANTUS  Inject 25 units nightly.     iron polysaccharides 150 MG capsule  Commonly known as:  NIFEREX  Take 1 capsule (150 mg total) by mouth daily.     lisinopril 20 MG tablet  Commonly known as:  PRINIVIL,ZESTRIL  take 1 tablet by mouth once daily for blood pressure     methocarbamol 500 MG tablet  Commonly known as:  ROBAXIN  Take 1 tablet (500 mg total) by mouth every 6 (six) hours as needed for muscle spasms.     multivitamin with minerals Tabs tablet  Take 1 tablet by mouth daily.     PROBIOTIC ACIDOPHILUS Caps  Take 1 tablet by mouth daily.     SSD 1 % cream  Generic drug:  silver sulfADIAZINE  Apply 1 application topically daily as needed. Rash/irritation     Vancomycin 750 MG/150ML Soln  Commonly known as:  VANCOCIN  Inject 150 mLs (750 mg total) into the vein every 12 (twelve) hours. Needs 4 more days of antibiotic  Start taking on:  08/13/2015        Discharge Instructions: Please refer to Patient Instructions section of EMR for full details.  Patient was counseled important signs and symptoms that should prompt return to medical care, changes in medications, dietary instructions, activity restrictions, and follow up appointments.   Follow-Up Appointments: Follow-up Information    Follow up with [name],[name], MD In 2 weeks.   Specialty:  Orthopedic Surgery   Contact information:   [place] [address] [phone]       Follow up with [facility] .   Specialty:  [place] information:   [address] [place], MD 08/12/2015, 2:43 PM PGY-1, [place]

## 2015-08-04 NOTE — Consult Note (Signed)
Date of Admission:  08/03/2015  Date of Consult:  08/04/2015  Reason for Consult: Possible meningoencephalitis fevers confusion Referring Physician: Dr. [name]   HPI: Steven [name] is an 55 y.o. 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 He also has a history of hypertension atrial fibrillation and polysubstance abuse including cocaine tobacco alcohol. He had been found unconscious at home with a [name] scope, score of 9. He had extremely low blood sugars which were treated with dextrose. He was given Narcan. In the ER he was witnessed to have decorticate posturing. He was given Ativan which broke this. Cultures were obtained and he was initially started on vancomycin and Zosyn with concern for SIRS and sepsis. CT of head was unrevealing. MRI with contrast was motion degraded but showed no evidence of temporal lobe enhancement or leptomeningeal enhancement on imaging. Antibiotics were changed over to more meningoencephalitis type coverage with vancomycin being continued and Zosyn changed to ceftriaxone and ampicillin along with addition of acyclovir. Evalee Jefferson been made for a lumbar puncture but in the interim the patient had a CT angiogram which showed evidence of multiple small pulmonary emboli and now he's been placed on IV heparin. [facility] who have been planning on doing a lumbar puncture did not feel comfortable performing lumbar puncture while patient was fully anticoagulated. I was asked see the patient provide recommendations on a course of treatment for presumed meningoencephalitis.  However upon reviewing this patient's case I do not feel that there is clear-cut evidence that he has meningoencephalitis and there are multiple confounding factors including his  extremely low blood sugars his cocaine abuse his possible seizure episodes  his known pulmonary emboli, and his amputation site   Past Medical History  Diagnosis Date  . Uncontrolled diabetes mellitus ([place])   . HTN (hypertension)   . History of DVT (deep vein thrombosis)   . Personal history of diabetic foot ulcer   . Peripheral vascular disease ([place])     a. Abnl ABIs 2014.  Marland Kitchen Peripheral neuropathy ([place]) 11/19/2011  . Varicose vein     legs  . Osteomyelitis of foot, right, acute ([place]) 08/30/2012  . Personal history of colonic adenomas 05/29/2013  . History of cocaine use   . PAF (paroxysmal atrial fibrillation) ([place])     a. Dx 11/2013 in setting of sepsis/foot infection.  . Tobacco abuse   . H/O ETOH abuse   . Dysrhythmia     afib last admission  . Seasonal allergies   . Adjustment disorder with depressed mood     Past Surgical History  Procedure Laterality Date  . Amputation  04/21/2011    Procedure: AMPUTATION RAY;  Surgeon: Newt Minion, MD;  Location: [place];  Service: Orthopedics;  Laterality: Right;  Rt foot 2nd ray ampt  . I&d extremity  05/03/2011    Procedure: IRRIGATION AND DEBRIDEMENT EXTREMITY;  Surgeon: Newt Minion, MD;  Location: [place];  Service: Orthopedics;  Laterality: Right;  Irrigation and Debridement Right Foot and Place Acell Xenograft  . Toe amputation  04/24/2012    great toe   right foot  . Amputation  04/24/2012    Procedure: AMPUTATION RAY;  Surgeon: Newt Minion, MD;  Location: [place];  Service: Orthopedics;  Laterality: Right;  Right foot 1st ray amputation  .  Amputation Left 04/23/2013    Procedure: AMPUTATION RAY;  Surgeon: Newt Minion, MD;  Location: [place];  Service: Orthopedics;  Laterality: Left;  Left Foot 5th Ray Amputation  . Colonoscopy    . Amputation Left 11/21/2013    Procedure: AMPUTATION RAY;  Surgeon: Newt Minion, MD;  Location: [place];  Service: Orthopedics;  Laterality: Left;  Left Foot 1st & 2nd Ray Amputation  . Amputation Right  12/04/2013    Procedure: AMPUTATION BELOW KNEE;  Surgeon: Marianna Payment, MD;  Location: [place];  Service: Orthopedics;  Laterality: Right;  . Amputation Left 12/29/2013    Procedure: LEFT AMPUTATION BELOW KNEE;  Surgeon: Marianna Payment, MD;  Location: [place];  Service: Orthopedics;  Laterality: Left;  . Amputation Right 04/15/2015    Procedure: AMPUTATION ABOVE KNEE, RIGHT;  Surgeon: Newt Minion, MD;  Location: [place];  Service: Orthopedics;  Laterality: Right;    Social History:  reports that he has been smoking Cigarettes.  He started smoking about 32 years ago. He has a 15 pack-year smoking history. He has never used smokeless tobacco. He reports that he drinks about 3.0 - 3.6 oz of alcohol per week. He reports that he does not use illicit drugs.   Family History  Problem Relation Age of Onset  . Diabetes Mother   . Stroke Brother   . Colon cancer Neg Hx     No Known Allergies   Medications: I have reviewed patients current medications as documented in Epic Anti-infectives    Start     Dose/Rate Route Frequency Ordered Stop   08/04/15 1300  acyclovir (ZOVIRAX) 900 mg in dextrose 5 % 150 mL IVPB  Status:  Discontinued     900 mg 168 mL/hr over 60 Minutes Intravenous Every 8 hours 08/04/15 1135 08/04/15 1628   08/04/15 0000  ampicillin (OMNIPEN) 2 g in sodium chloride 0.9 % 50 mL IVPB  Status:  Discontinued     2 g 150 mL/hr over 20 Minutes Intravenous 6 times per day 08/03/15 2311 08/04/15 1628   08/03/15 2330  vancomycin (VANCOCIN) IVPB 1000 mg/200 mL premix     1,000 mg 200 mL/hr over 60 Minutes Intravenous Every 8 hours 08/03/15 2005     08/03/15 2330  piperacillin-tazobactam (ZOSYN) IVPB 3.375 g  Status:  Discontinued     3.375 g 12.5 mL/hr over 240 Minutes Intravenous Every 8 hours 08/03/15 2005 08/03/15 2122   08/03/15 2315  cefTRIAXone (ROCEPHIN) 2 g in dextrose 5 % 50 mL IVPB     2 g 100 mL/hr over 30 Minutes Intravenous Every 12 hours 08/03/15 2311     08/03/15  1500  vancomycin (VANCOCIN) 2,000 mg in sodium chloride 0.9 % 500 mL IVPB     2,000 mg 250 mL/hr over 120 Minutes Intravenous  Once 08/03/15 1456 08/03/15 1859   08/03/15 1500  piperacillin-tazobactam (ZOSYN) IVPB 3.375 g     3.375 g 100 mL/hr over 30 Minutes Intravenous  Once 08/03/15 1456 08/03/15 1626         ROS:as in HPI otherwise remainder of 12 point Review of Systems is not obtainable due to patient's confusion   Blood pressure 161/77, pulse 95, temperature 98.2 F (36.8 C), temperature source Axillary, resp. rate 12, height '6\' 4"'  (1.93 m), weight 201 lb 15.1 oz (91.6 kg), SpO2 97 %.   General: Obtunded moves forward and withdraws his leg when I removed the dressing from the amputation site HEENT: anicteric sclera Cardiovascular:Irr  irr rhythm,   no murmur rubs or gallops Pulmonary: clear to auscultation bilaterally, no wheezing, rales or rhonchi Gastrointestinal: soft nontender, nondistended, normal bowel sounds, Musculoskeletal/skin:  Right AKA site 08/04/15:       Neuro: nonfocal obtunded   Results for orders placed or performed during the hospital encounter of 08/03/15 (from the past 48 hour(s))  CBG monitoring, ED     Status: Abnormal   Collection Time: 08/03/15 12:18 PM  Result Value Ref Range   Glucose-Capillary 43 (LL) 65 - 99 mg/dL  Urinalysis, Routine w reflex microscopic (not at [facility])     Status: Abnormal   Collection Time: 08/03/15 12:30 PM  Result Value Ref Range   Color, Urine YELLOW YELLOW   APPearance CLEAR CLEAR   Specific Gravity, Urine 1.019 1.005 - 1.030   pH 7.5 5.0 - 8.0   Glucose, UA >1000 (A) NEGATIVE mg/dL   Hgb urine dipstick MODERATE (A) NEGATIVE   Bilirubin Urine NEGATIVE NEGATIVE   Ketones, ur NEGATIVE NEGATIVE mg/dL   Protein, ur >300 (A) NEGATIVE mg/dL   Nitrite NEGATIVE NEGATIVE   Leukocytes, UA NEGATIVE NEGATIVE  Urine rapid drug screen (hosp performed)     Status: Abnormal   Collection Time: 08/03/15 12:30 PM  Result  Value Ref Range   Opiates NONE DETECTED NONE DETECTED   Cocaine POSITIVE (A) NONE DETECTED   Benzodiazepines NONE DETECTED NONE DETECTED   Amphetamines NONE DETECTED NONE DETECTED   Tetrahydrocannabinol NONE DETECTED NONE DETECTED   Barbiturates NONE DETECTED NONE DETECTED    Comment:        DRUG SCREEN FOR MEDICAL PURPOSES ONLY.  IF CONFIRMATION IS NEEDED FOR ANY PURPOSE, NOTIFY LAB WITHIN 5 DAYS.        LOWEST DETECTABLE LIMITS FOR URINE DRUG SCREEN Drug Class       Cutoff (ng/mL) Amphetamine      1000 Barbiturate      200 Benzodiazepine   277 Tricyclics       824 Opiates          300 Cocaine          300 THC              50   Urine microscopic-add on     Status: Abnormal   Collection Time: 08/03/15 12:30 PM  Result Value Ref Range   Squamous Epithelial / LPF 0-5 (A) NONE SEEN   WBC, UA 0-5 0 - 5 WBC/hpf   RBC / HPF 6-30 0 - 5 RBC/hpf   Bacteria, UA FEW (A) NONE SEEN   Casts GRANULAR CAST (A) NEGATIVE  Urine culture     Status: None (Preliminary result)   Collection Time: 08/03/15 12:30 PM  Result Value Ref Range   Specimen Description URINE, RANDOM    Special Requests Normal    Culture NO GROWTH < 24 HOURS    Report Status PENDING   CBG monitoring, ED     Status: Abnormal   Collection Time: 08/03/15  1:06 PM  Result Value Ref Range   Glucose-Capillary 165 (H) 65 - 99 mg/dL  Ammonia     Status: None   Collection Time: 08/03/15  1:45 PM  Result Value Ref Range   Ammonia 30 9 - 35 umol/L  CK     Status: None   Collection Time: 08/03/15  1:49 PM  Result Value Ref Range   Total CK 183 49 - 397 U/L  Comprehensive metabolic panel     Status: Abnormal   Collection  Time: 08/03/15  1:49 PM  Result Value Ref Range   Sodium 136 135 - 145 mmol/L   Potassium 3.9 3.5 - 5.1 mmol/L   Chloride 101 101 - 111 mmol/L   CO2 23 22 - 32 mmol/L   Glucose, Bld 120 (H) 65 - 99 mg/dL   BUN 13 6 - 20 mg/dL   Creatinine, Ser 0.80 0.61 - 1.24 mg/dL   Calcium 10.1 8.9 - 10.3 mg/dL    Total Protein 8.3 (H) 6.5 - 8.1 g/dL   Albumin 3.0 (L) 3.5 - 5.0 g/dL   AST 27 15 - 41 U/L   ALT 17 17 - 63 U/L   Alkaline Phosphatase 79 38 - 126 U/L   Total Bilirubin 0.3 0.3 - 1.2 mg/dL   GFR calc non Af Amer >60 >60 mL/min   GFR calc Af Amer >60 >60 mL/min    Comment: (NOTE) The eGFR has been calculated using the CKD EPI equation. This calculation has not been validated in all clinical situations. eGFR's persistently <60 mL/min signify possible Chronic Kidney Disease.    Anion gap 12 5 - 15  CBC     Status: Abnormal   Collection Time: 08/03/15  1:49 PM  Result Value Ref Range   WBC 16.8 (H) 4.0 - 10.5 K/uL   RBC 6.19 (H) 4.22 - 5.81 MIL/uL   Hemoglobin 17.5 (H) 13.0 - 17.0 g/dL   HCT 53.4 (H) 39.0 - 52.0 %   MCV 86.3 78.0 - 100.0 fL   MCH 28.3 26.0 - 34.0 pg   MCHC 32.8 30.0 - 36.0 g/dL   RDW 16.1 (H) 11.5 - 15.5 %   Platelets 234 150 - 400 K/uL  Troponin I     Status: Abnormal   Collection Time: 08/03/15  1:49 PM  Result Value Ref Range   Troponin I 0.04 (H) <0.031 ng/mL    Comment:        PERSISTENTLY INCREASED TROPONIN VALUES IN THE RANGE OF 0.04-0.49 ng/mL CAN BE SEEN IN:       -UNSTABLE ANGINA       -CONGESTIVE HEART FAILURE       -MYOCARDITIS       -CHEST TRAUMA       -ARRYHTHMIAS       -LATE PRESENTING MYOCARDIAL INFARCTION       -COPD   CLINICAL FOLLOW-UP RECOMMENDED.   POC CBG, ED     Status: None   Collection Time: 08/03/15  1:55 PM  Result Value Ref Range   Glucose-Capillary 99 65 - 99 mg/dL   Comment 1 Notify RN    Comment 2 Document in Chart   Blood Culture (routine x 2)     Status: None (Preliminary result)   Collection Time: 08/03/15  2:30 PM  Result Value Ref Range   Specimen Description BLOOD LEFT ANTECUBITAL    Special Requests BOTTLES DRAWN AEROBIC AND ANAEROBIC 5CC    Culture NO GROWTH 1 DAY    Report Status PENDING   I-Stat CG4 Lactic Acid, ED  (not at [facility])     Status: Abnormal   Collection Time: 08/03/15  2:32 PM  Result Value Ref  Range   Lactic Acid, Venous 3.21 (HH) 0.5 - 2.0 mmol/L   Comment NOTIFIED PHYSICIAN   Blood Culture (routine x 2)     Status: None (Preliminary result)   Collection Time: 08/03/15  3:00 PM  Result Value Ref Range   Specimen Description BLOOD LEFT HAND    Special Requests  BOTTLES DRAWN AEROBIC AND ANAEROBIC 5CC    Culture NO GROWTH 1 DAY    Report Status PENDING   CBG monitoring, ED     Status: Abnormal   Collection Time: 08/03/15  4:03 PM  Result Value Ref Range   Glucose-Capillary 43 (LL) 65 - 99 mg/dL  I-Stat arterial blood gas, ED     Status: Abnormal   Collection Time: 08/03/15  4:25 PM  Result Value Ref Range   pH, Arterial 7.401 7.350 - 7.450   pCO2 arterial 36.4 35.0 - 45.0 mmHg   pO2, Arterial 71.0 (L) 80.0 - 100.0 mmHg   Bicarbonate 22.6 20.0 - 24.0 mEq/L   TCO2 24 0 - 100 mmol/L   O2 Saturation 94.0 %   Acid-base deficit 2.0 0.0 - 2.0 mmol/L   Patient temperature HIDE    Collection site RADIAL, ALLEN'S TEST ACCEPTABLE    Drawn by MD    Sample type ARTERIAL   I-stat chem 8, ed     Status: Abnormal   Collection Time: 08/03/15  4:57 PM  Result Value Ref Range   Sodium 140 135 - 145 mmol/L   Potassium 3.5 3.5 - 5.1 mmol/L   Chloride 101 101 - 111 mmol/L   BUN 14 6 - 20 mg/dL   Creatinine, Ser 0.60 (L) 0.61 - 1.24 mg/dL   Glucose, Bld 111 (H) 65 - 99 mg/dL   Calcium, Ion 1.17 1.12 - 1.23 mmol/L   TCO2 24 0 - 100 mmol/L   Hemoglobin 18.4 (H) 13.0 - 17.0 g/dL   HCT 54.0 (H) 39.0 - 52.0 %  I-Stat CG4 Lactic Acid, ED     Status: None   Collection Time: 08/03/15  4:57 PM  Result Value Ref Range   Lactic Acid, Venous 1.98 0.5 - 2.0 mmol/L  Ethanol     Status: None   Collection Time: 08/03/15  5:04 PM  Result Value Ref Range   Alcohol, Ethyl (B) <5 <5 mg/dL    Comment:        LOWEST DETECTABLE LIMIT FOR SERUM ALCOHOL IS 5 mg/dL FOR MEDICAL PURPOSES ONLY   Acetaminophen level     Status: Abnormal   Collection Time: 08/03/15  5:04 PM  Result Value Ref Range    Acetaminophen (Tylenol), Serum <10 (L) 10 - 30 ug/mL    Comment:        THERAPEUTIC CONCENTRATIONS VARY SIGNIFICANTLY. A RANGE OF 10-30 ug/mL MAY BE AN EFFECTIVE CONCENTRATION FOR MANY PATIENTS. HOWEVER, SOME ARE BEST TREATED AT CONCENTRATIONS OUTSIDE THIS RANGE. ACETAMINOPHEN CONCENTRATIONS >150 ug/mL AT 4 HOURS AFTER INGESTION AND >50 ug/mL AT 12 HOURS AFTER INGESTION ARE OFTEN ASSOCIATED WITH TOXIC REACTIONS.   Salicylate level     Status: None   Collection Time: 08/03/15  5:04 PM  Result Value Ref Range   Salicylate Lvl <9.7 2.8 - 30.0 mg/dL  CBG monitoring, ED     Status: Abnormal   Collection Time: 08/03/15  5:46 PM  Result Value Ref Range   Glucose-Capillary 141 (H) 65 - 99 mg/dL  C-peptide     Status: Abnormal   Collection Time: 08/03/15  6:35 PM  Result Value Ref Range   C-Peptide 0.6 (L) 1.1 - 4.4 ng/mL    Comment: (NOTE) C-Peptide reference interval is for fasting patients. Performed At: [facility] [address] [place], [place] [id number] Lindon Romp MD ZS:[id number]   CBG monitoring, ED     Status: Abnormal   Collection Time: 08/03/15  6:54 PM  Result Value Ref Range   Glucose-Capillary 148 (H) 65 - 99 mg/dL  CBG monitoring, ED     Status: Abnormal   Collection Time: 08/03/15 10:17 PM  Result Value Ref Range   Glucose-Capillary 103 (H) 65 - 99 mg/dL   Comment 1 Notify RN    Comment 2 Document in Chart   TSH     Status: None   Collection Time: 08/03/15 10:37 PM  Result Value Ref Range   TSH 0.625 0.350 - 4.500 uIU/mL  Troponin I     Status: Abnormal   Collection Time: 08/03/15 10:37 PM  Result Value Ref Range   Troponin I 0.05 (H) <0.031 ng/mL    Comment:        PERSISTENTLY INCREASED TROPONIN VALUES IN THE RANGE OF 0.04-0.49 ng/mL CAN BE SEEN IN:       -UNSTABLE ANGINA       -CONGESTIVE HEART FAILURE       -MYOCARDITIS       -CHEST TRAUMA       -ARRYHTHMIAS       -LATE PRESENTING MYOCARDIAL INFARCTION       -COPD   CLINICAL  FOLLOW-UP RECOMMENDED.   Magnesium     Status: Abnormal   Collection Time: 08/03/15 10:37 PM  Result Value Ref Range   Magnesium 1.3 (L) 1.7 - 2.4 mg/dL  Phosphorus     Status: None   Collection Time: 08/03/15 10:37 PM  Result Value Ref Range   Phosphorus 2.5 2.5 - 4.6 mg/dL  I-Stat CG4 Lactic Acid, ED     Status: None   Collection Time: 08/03/15 10:45 PM  Result Value Ref Range   Lactic Acid, Venous 0.96 0.5 - 2.0 mmol/L  CBG monitoring, ED     Status: Abnormal   Collection Time: 08/04/15 12:49 AM  Result Value Ref Range   Glucose-Capillary 101 (H) 65 - 99 mg/dL  Glucose, capillary     Status: Abnormal   Collection Time: 08/04/15  1:25 AM  Result Value Ref Range   Glucose-Capillary 106 (H) 65 - 99 mg/dL  Troponin I     Status: Abnormal   Collection Time: 08/04/15  2:28 AM  Result Value Ref Range   Troponin I 0.07 (H) <0.031 ng/mL    Comment:        PERSISTENTLY INCREASED TROPONIN VALUES IN THE RANGE OF 0.04-0.49 ng/mL CAN BE SEEN IN:       -UNSTABLE ANGINA       -CONGESTIVE HEART FAILURE       -MYOCARDITIS       -CHEST TRAUMA       -ARRYHTHMIAS       -LATE PRESENTING MYOCARDIAL INFARCTION       -COPD   CLINICAL FOLLOW-UP RECOMMENDED.   Comprehensive metabolic panel     Status: Abnormal   Collection Time: 08/04/15  2:28 AM  Result Value Ref Range   Sodium 138 135 - 145 mmol/L   Potassium 3.5 3.5 - 5.1 mmol/L   Chloride 104 101 - 111 mmol/L   CO2 22 22 - 32 mmol/L   Glucose, Bld 115 (H) 65 - 99 mg/dL   BUN 9 6 - 20 mg/dL   Creatinine, Ser 0.98 0.61 - 1.24 mg/dL   Calcium 9.0 8.9 - 10.3 mg/dL   Total Protein 5.7 (L) 6.5 - 8.1 g/dL   Albumin 2.1 (L) 3.5 - 5.0 g/dL   AST 18 15 - 41 U/L   ALT 13 (L) 17 - 63 U/L  Alkaline Phosphatase 60 38 - 126 U/L   Total Bilirubin 0.2 (L) 0.3 - 1.2 mg/dL   GFR calc non Af Amer >60 >60 mL/min   GFR calc Af Amer >60 >60 mL/min    Comment: (NOTE) The eGFR has been calculated using the CKD EPI equation. This calculation has not  been validated in all clinical situations. eGFR's persistently <60 mL/min signify possible Chronic Kidney Disease.    Anion gap 12 5 - 15  CBC     Status: Abnormal   Collection Time: 08/04/15  2:28 AM  Result Value Ref Range   WBC 11.0 (H) 4.0 - 10.5 K/uL   RBC 5.31 4.22 - 5.81 MIL/uL   Hemoglobin 14.8 13.0 - 17.0 g/dL   HCT 45.0 39.0 - 52.0 %   MCV 84.7 78.0 - 100.0 fL   MCH 27.9 26.0 - 34.0 pg   MCHC 32.9 30.0 - 36.0 g/dL   RDW 16.3 (H) 11.5 - 15.5 %   Platelets 216 150 - 400 K/uL  Magnesium     Status: Abnormal   Collection Time: 08/04/15  2:28 AM  Result Value Ref Range   Magnesium 1.4 (L) 1.7 - 2.4 mg/dL  D-dimer, quantitative (not at [facility])     Status: Abnormal   Collection Time: 08/04/15  2:28 AM  Result Value Ref Range   D-Dimer, Quant 1.14 (H) 0.00 - 0.50 ug/mL-FEU    Comment: (NOTE) At the manufacturer cut-off of 0.50 ug/mL FEU, this assay has been documented to exclude PE with a sensitivity and negative predictive value of 97 to 99%.  At this time, this assay has not been approved by the FDA to exclude DVT/VTE. Results should be correlated with clinical presentation.   Glucose, capillary     Status: None   Collection Time: 08/04/15  3:33 AM  Result Value Ref Range   Glucose-Capillary 96 65 - 99 mg/dL  Glucose, capillary     Status: None   Collection Time: 08/04/15  5:21 AM  Result Value Ref Range   Glucose-Capillary 78 65 - 99 mg/dL  Troponin I     Status: Abnormal   Collection Time: 08/04/15  8:28 AM  Result Value Ref Range   Troponin I 0.05 (H) <0.031 ng/mL    Comment:        PERSISTENTLY INCREASED TROPONIN VALUES IN THE RANGE OF 0.04-0.49 ng/mL CAN BE SEEN IN:       -UNSTABLE ANGINA       -CONGESTIVE HEART FAILURE       -MYOCARDITIS       -CHEST TRAUMA       -ARRYHTHMIAS       -LATE PRESENTING MYOCARDIAL INFARCTION       -COPD   CLINICAL FOLLOW-UP RECOMMENDED.   Heparin level (unfractionated)     Status: None   Collection Time: 08/04/15  8:28 AM    Result Value Ref Range   Heparin Unfractionated 0.60 0.30 - 0.70 IU/mL    Comment:        IF HEPARIN RESULTS ARE BELOW EXPECTED VALUES, AND PATIENT DOSAGE HAS BEEN CONFIRMED, SUGGEST FOLLOW UP TESTING OF ANTITHROMBIN III LEVELS.   APTT     Status: None   Collection Time: 08/04/15  8:28 AM  Result Value Ref Range   aPTT 35 24 - 37 seconds  Magnesium     Status: None   Collection Time: 08/04/15  8:28 AM  Result Value Ref Range   Magnesium 1.9 1.7 - 2.4 mg/dL  Glucose,  capillary     Status: Abnormal   Collection Time: 08/04/15  8:41 AM  Result Value Ref Range   Glucose-Capillary 103 (H) 65 - 99 mg/dL   Comment 1 Notify RN    Comment 2 Document in Chart   MRSA PCR Screening     Status: None   Collection Time: 08/04/15 11:19 AM  Result Value Ref Range   MRSA by PCR NEGATIVE NEGATIVE    Comment:        The GeneXpert MRSA Assay (FDA approved for NASAL specimens only), is one component of a comprehensive MRSA colonization surveillance program. It is not intended to diagnose MRSA infection nor to guide or monitor treatment for MRSA infections.   APTT     Status: None   Collection Time: 08/04/15  1:37 PM  Result Value Ref Range   aPTT 24 24 - 37 seconds  Protime-INR     Status: None   Collection Time: 08/04/15  1:37 PM  Result Value Ref Range   Prothrombin Time 13.9 11.6 - 15.2 seconds   INR 1.05 0.00 - 1.49  APTT     Status: None   Collection Time: 08/04/15  3:25 PM  Result Value Ref Range   aPTT 36 24 - 37 seconds   '@BRIEFLABTABLE' (sdes,specrequest,cult,reptstatus)   ) Recent Results (from the past 720 hour(s))  Urine culture     Status: None (Preliminary result)   Collection Time: 08/03/15 12:30 PM  Result Value Ref Range Status   Specimen Description URINE, RANDOM  Final   Special Requests Normal  Final   Culture NO GROWTH < 24 HOURS  Final   Report Status PENDING  Incomplete  Blood Culture (routine x 2)     Status: None (Preliminary result)   Collection  Time: 08/03/15  2:30 PM  Result Value Ref Range Status   Specimen Description BLOOD LEFT ANTECUBITAL  Final   Special Requests BOTTLES DRAWN AEROBIC AND ANAEROBIC 5CC  Final   Culture NO GROWTH 1 DAY  Final   Report Status PENDING  Incomplete  Blood Culture (routine x 2)     Status: None (Preliminary result)   Collection Time: 08/03/15  3:00 PM  Result Value Ref Range Status   Specimen Description BLOOD LEFT HAND  Final   Special Requests BOTTLES DRAWN AEROBIC AND ANAEROBIC 5CC  Final   Culture NO GROWTH 1 DAY  Final   Report Status PENDING  Incomplete  MRSA PCR Screening     Status: None   Collection Time: 08/04/15 11:19 AM  Result Value Ref Range Status   MRSA by PCR NEGATIVE NEGATIVE Final    Comment:        The GeneXpert MRSA Assay (FDA approved for NASAL specimens only), is one component of a comprehensive MRSA colonization surveillance program. It is not intended to diagnose MRSA infection nor to guide or monitor treatment for MRSA infections.      Impression/Recommendation  Active Problems:   Altered mental status   Lung nodule seen on imaging study   Steven [name] is a 55 y.o. male with poorly controlled diabetes mellitus, paroxysmal atrial fibrillation polysubstance abuse including cocaine and alcohol use who had been found unresponsive at home with a Glasgow Coma Scale, scale of 9 with extremely low blood sugars and possible seizure activitiy, he was also febrile and there was concern for possible SIRS and later for meningoencephaltiis  #1 Possible meningoencephalitis:  Since we do not have an LP we do not have the data  we would typically want to rule in or rule out bacterial meningoencephalitis.  The lack of temporal lobe enhancement on MRI rules out herpes simplex type I meningoencephalitis. Her B simplex type II does not cause meningoencephalitis. Therefore I will stop his acyclovir.  Secondly he has no evidence of leptomeningeal enhancement on MRI which  one would expect with a [place] meningoencephalitis and septic picture due to bacterial meningitis.  I think he has other reasons why he may be obtunded namely his extremely low blood sugars his possible seizures and other metabolic derangements.  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  I will also DC his ampicillin for now but continue his vancomycin and rocephin  I would like to further de-escalate these as well in the coming days  Recheck HIV, RPR and crypto ag in serum  #2 Fevers: I have concern that his stump site may be infected again given tender he was in this region. I would strongly consider getting an MRI of his right thigh down to the stump site when he is able to cooperate with such a scan.  #3 Infection prevention the patient is been on antibiotics for more than 24 hours and therefore unit he had meningoencephalitis further droplet precautions would no longer be indicated    08/04/2015, 4:47 PM   Thank you so much for this interesting consult  [place] for [place] [phone] (pager) [phone] (office) 08/04/2015, 4:47 PM  [place] 08/04/2015, 4:47 PM

## 2015-08-04 NOTE — Progress Notes (Signed)
ANTICOAGULATION CONSULT NOTE - Follow Up Consult  Pharmacy Consult for heparin  Indication: PE  No Known Allergies  Patient Measurements: Height: 6\' 4"  (193 cm) Weight: 201 lb 15.1 oz (91.6 kg) IBW/kg (Calculated) : 86.8  Vital Signs: Temp: 97.7 F (36.5 C) (02/22 2257) Temp Source: Axillary (02/22 2257) BP: 160/98 mmHg (02/22 2257) Pulse Rate: 82 (02/22 2257)  Labs:  Recent Labs  08/03/15 1349 08/03/15 1657 08/03/15 2237 08/04/15 0228  08/04/15 0828 08/04/15 1337 08/04/15 1525 08/04/15 2220  HGB 17.5* 18.4*  --  14.8  --   --   --   --   --   HCT 53.4* 54.0*  --  45.0  --   --   --   --   --   PLT 234  --   --  216  --   --   --   --   --   APTT  --   --   --   --   < > 35 24 36 46*  LABPROT  --   --   --   --   --   --  13.9  --   --   INR  --   --   --   --   --   --  1.05  --   --   HEPARINUNFRC  --   --   --   --   --  0.60  --   --   --   CREATININE 0.80 0.60*  --  0.98  --   --   --   --   --   CKTOTAL 183  --   --   --   --   --   --   --   --   TROPONINI 0.04*  --  0.05* 0.07*  --  0.05*  --   --   --   < > = values in this interval not displayed.  Estimated Creatinine Clearance: 103.3 mL/min (by C-G formula based on Cr of 0.98).   Medications:  Scheduled:  . [START ON 08/05/2015] antiseptic oral rinse  7 mL Mouth Rinse q12n4p  . cefTRIAXone (ROCEPHIN)  IV  2 g Intravenous Q12H  . chlorhexidine  15 mL Mouth Rinse BID  . collagenase   Topical Daily  . folic acid  1 mg Intravenous Daily  . sodium chloride  1,000 mL Intravenous Once  . sodium chloride flush  3 mL Intravenous Q12H  . thiamine  100 mg Intravenous Daily  . vancomycin  1,000 mg Intravenous Q8H    Assessment: 55 yo  Male with PE and r/o meningitis. Heparin was stopped earlier  for an LP but this is not able to be completed. Patient to restart heparin.  Initial aPTT is subtherapeutic at 46 sec on heparin 1350 units/hr.   Goal of Therapy:  Heparin level 0.3-0.7 units/ml aPTT 66-102  seconds Monitor platelets by anticoagulation protocol: Yes   Plan:  Increase heparin to 1550 units/hr 6h aPTT Daily aPTT/HL/CBC  Steven [name]. Diona Foley, PharmD, [place] Clinical Pharmacist Pager [phone] 08/04/2015 11:27 PM

## 2015-08-04 NOTE — Procedures (Signed)
ELECTROENCEPHALOGRAM REPORT  Patient: Steven [name]       Room #: [id number] EEG No. ID: [id number] Age: 55 y.o.        Sex: male Referring Physician: [name], B Report Date:  08/04/2015        Interpreting Physician: Anthony Sar  History: [name] is an 55 y.o. male with a history of alcohol and tobacco abuse as well as diabetes mellitus and peripheral vascular disease, admitted with obtundation with fever and hypoglycemia. Urine was positive for cocaine.  Indications for study:  Assess severity of encephalopathy; rule out seizure activity.  Technique: This is an 18 channel routine scalp EEG performed at the bedside with bipolar and monopolar montages arranged in accordance to the international 10/20 system of electrode placement.   Description: Patient was given 2 mg of Ativan IV to start of this evaluation. Predominant activity consisted of markedly suppressed diffuse symmetrical brain activity with intermittent brief runs of rhythmic 8-12 for his activity diffusely, lasting up to 2 seconds. Photic stimulation was not performed. No epileptiform discharges were recorded.  Interpretation: See EEG is abnormal with marked generalized continuous slowing of cerebral activity which can be seen with metabolic and toxic encephalopathies, but is nonspecific. Sedating medication may be contributing to suppression brain activity, also. No evidence of epileptiform activity was recorded.   Rush Farmer M.D. [facility] [phone]

## 2015-08-04 NOTE — Progress Notes (Signed)
Family Medicine Teaching Service Daily Progress Note Intern Pager: [phone]  Patient name: Steven [name] Medical record number: [id number] Date of birth: September 27, 1960 Age: 55 y.o. Gender: male  Primary Care Provider: Chrisandra Netters, MD Consultants: Neurology Code Status: FULL  Pt Overview and Major Events to Date:  2/21 - admitted to [facility]; one dose of zosyn; started CTX 2/22 - started vanc, amp  Assessment and Plan: Steven [name] is a 55 y.o. male presenting with altered mental status. 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 GCS score 9. Hypoglycemia managed as below, suspected to be contributing. Narcan given in ED. Decorticate posturing noted in ED, resolved with 9m of Ativan. Leukocytosis of 16.8 noted. Troponin 0.04. Lactic Acid 3.21 initially, improved to 1.98. Ammonia normal at 30. ABG with pO2 71, pH 7.4, pCO2 36.4, bicarb 22.6. CK normal. Urine positive for cocaine. CT head negative. CXR negative. Neurology consulted in ED. Many etiologies in differential including Lantus overdose, EtOH intoxication/withdrawal, cocaine use, seizures, infection, meningitis. SIRS criteria met with HR, respiratory rate, leukocytosis, and initial lactic acidosis. qSOFA of 1 due to altered mental status.Troponins 0.05, 0.07, and 0.05 since admission. TSH WNL. MRI with no acute intracranial process, only mild chronic small vessel ischemic disease.  - Monitor vitals - NPO pending improved mental status - Neuro checks - Follow up blood cultures, urine culture - pending - Follow up EEG results - performed but not yet read - Neurology consulted, appreciate recommendations - Acetaminophen suppository PRN fever - Continue vanc (2/22>), CTX (2/21), amp (2/22) due to SIRS and qSOFA. If cultures return as normal or etiology of altered mental status presents  itself, consider discontinuing.  - Radiology to perform LP today  - Begin acyclovir out of concern for HSV given AMS with seizure  # Multiple pulmonary emboli: Patient with new oxygen requirement, hx of DVT, non-compliance with Xarelto, and febrile. Found on CTA 2/22. Small nonobstruction pulmonary emboli in branches of LLL pulmonary artery; no demonstrable R heart strain. Nodular opacity also identified in LUL which will need to be followed up in 3-6 months to r/u malignancy. Started on heparin gtt. Now on 2L [place].  - Continue heparin drip - Continue to monitor respiratory status   # Hypoglycemia: History of insulin dependent diabetes. Suspected to be contributing to altered mental status. Hypoglycemia noted with initial CBG 30--given 1amp of D50 prior to arrival to ED. Another two amps of D50 given and initiated on D10. Last A1C 11.2 on 04/14/15. Prescribed Lantus 45units at night and 10 units with meals at home. Prescribed Gabapentin 8026mTID. CBGs improved to 78, 96, 106, 101 overnight. Currently on D5 1/2NS.  - Hold diabetic medications - Monitor CBG - Consider scheduled sliding scale if hyperglycemia noted  # HTN: Prescribed Lisinopril 2053mHypertensive overnight, with avg BP 160s/80s.  - Holding oral antihypertensives. - Hydralazine PRN  # Atrial Fibrillation: Currently prescribed Diltiazem 120m34mily, Xarelto 20mg21md Aspirin 81mg.64montacted cardiology concerning Diltiazem. Will hold at this time. Recommend Metoprolol IV if needed for HR control, however use with caution given cocaine positive - Anticoagulation with heparin gtt - Restart oral home medications when able  # History of Alcohol Use/Illicit Drug Use: UDS positive for cocaine.  - CIWA protocol  # Stump wound: Wound present on stump of R AKA. Patient with history of dry gangrene at that location  within past few months. Does not appear infected at this time.  - Consult wound care   FEN/GI: NPO, D5 1/2  NS_0  Prophylaxis: heparin gtt  Disposition: Remain in SDU until improvement in mental status  Subjective:  Patient still altered this AM. Opens eyes only to pain.  After admission, patient became febrile to 101.36F. LP was unable to be performed last night, so antibiotics were started. Patient also developed new oxygen requirement and became tachycardic, so CTA was performed, which showed multiple PE.   Objective: Temp:  [98 F (36.7 C)-101.9 F (38.8 C)] 98 F (36.7 C) (02/22 0846) Pulse Rate:  [47-114] 94 (02/22 0720) Resp:  [14-28] 20 (02/22 0720) BP: (103-195)/(66-108) 169/86 mmHg (02/22 0720) SpO2:  [94 %-100 %] 100 % (02/22 0720) Weight:  [201 lb 15.1 oz (91.6 kg)] 201 lb 15.1 oz (91.6 kg) (02/21 1415) Physical Exam: General: lying in bed, moaning intermittently, eyes closed Cardiovascular: RRR, no murmurs appreciated Respiratory: CTAB, normal work of breathing on 2L [place], no wheezes or rhonchi Abdomen: soft, non-distended, +BS Extremities: R AKA, L BKA Neuro: opens eyes only to painful stimuli  Laboratory:  Recent Labs Lab 08/03/15 1349 08/03/15 1657 08/04/15 0228  WBC 16.8*  --  11.0*  HGB 17.5* 18.4* 14.8  HCT 53.4* 54.0* 45.0  PLT 234  --  216    Recent Labs Lab 08/03/15 1349 08/03/15 1657 08/04/15 0228  NA 136 140 138  K 3.9 3.5 3.5  CL 101 101 104  CO2 23  --  22  BUN _1 CREATININE 0.80 0.60* 0.98  CALCIUM 10.1  --  9.0  PROT 8.3*  --  5.7*  BILITOT 0.3  --  0.2*  ALKPHOS 79  --  60  ALT 17  --  13*  AST 27  --  18  GLUCOSE 120* 111* 115*    Imaging/Diagnostic Tests: Ct Head Wo Contrast 08/03/2015 IMPRESSION: No acute intracranial pathology.   Ct Angio Chest Pe W/cm &/or Wo Cm 08/04/2015  ADDENDUM REPORT: 08/04/2015 07:44 ADDENDUM: Critical Value/emergent results were called by telephone at the time of interpretation on 08/04/2015 at 7:35 AM to Dr. Junie Panning , who verbally acknowledged these results.  08/04/2015 IMPRESSION: Small  pulmonary emboli in branches of the left lower lobe pulmonary artery. These small pulmonary emboli are nonobstructing. No more central pulmonary embolus. No demonstrable right heart strain. Prominence of the ascending thoracic aorta with a measured diameter 4.2 x 4.1 cm. Recommend annual imaging followup by CTA or MRA. This recommendation follows 2010 ACCF/AHA/AATS/ACR/ASA/SCA/SCAI/SIR/STS/SVM Guidelines for the Diagnosis and Management of Patients with Thoracic Aortic Disease. Circulation. 2010; 121: e266-e369 8 mm nodular opacity left upper lobe. Followup of this nodular opacity should be based on Fleischner Society guidelines. If the patient is at high risk for bronchogenic carcinoma, follow-up chest CT at 3-75month is recommended. If the patient is at low risk for bronchogenic carcinoma, follow-up chest CT at 6-12 months is recommended. This recommendation follows the consensus statement: Guidelines for Management of Small Pulmonary Nodules Detected on CT Scans: A Statement from the [place] published in Radiology 2005; 237:395-400. No frank edema or consolidation. Patchy atelectasis bilaterally. No demonstrable adenopathy.    Mr BJeri CosWo Contrast 08/03/2015 IMPRESSION: 1. Motion degraded study. 2. No acute intracranial process identified. 3. 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 4. Mild chronic small vessel ischemic disease.   Dg Chest Port 1 View 08/03/2015 IMPRESSION: No active disease.  Verner Mould, MD 08/04/2015, 11:07 AM PGY-1, [place] Intern pager: [phone], text pages welcome

## 2015-08-04 NOTE — Progress Notes (Signed)
Family Medicine Teaching Service Daily Progress Note Intern Pager: [phone]  Patient name: Steven [name] Medical record number: [id number] Date of birth: June 02, 1961 Age: 55 y.o. Gender: male  Primary Care Provider: Chrisandra Netters, MD Consultants: Neurology, ID Code Status: FULL  Pt Overview and Major Events to Date:  2/21 - admitted to [facility]; one dose of zosyn; started CTX 2/22 - started vanc, amp, acyclovir; acyclovir and amp d/ced  Assessment and Plan: Steven [name] is a 55 y.o. male presenting with altered mental status. 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 GCS score 9. Hypoglycemia managed as below, suspected to be contributing. Narcan given in ED. Decorticate posturing noted in ED, resolved with 68m of Ativan. Leukocytosis of 16.8 noted. Troponin 0.04, 0.05, 0.07. Lactic Acid 3.21 initially, improved to 1.98. Ammonia normal at 30. ABG with pO2 71, pH 7.4, pCO2 36.4, bicarb 22.6. CK normal. TSH normal. Urine positive for cocaine. CT head negative. MRI with no acute intracranial process. CXR negative. Many etiologies in differential including Lantus overdose, EtOH intoxication/withdrawal, cocaine use, seizures, infection, meningitis. SIRS criteria met with HR, respiratory rate, leukocytosis, and initial lactic acidosis. qSOFA of 1 due to altered mental status. EEG showed findings most consistent with encephalopathy, probably due to either persistent hypoglycemia or post-ictal state. Decision was made to treat patient empirically for meningitis, so LP was not performed yesterday. Patient seen by ID, who feel this is not meningitis, and more likely result of hypoglycemia with concomitant seizures, other metabolic derangements, or 2/2 stump infection.  - Monitor vitals - NPO pending improved mental status - Neuro checks - Follow up  blood cultures, urine culture - NGx1d - Neurology consulted, appreciate recommendations - ID consulted, appreciate recommendations  - Acetaminophen suppository PRN fever - Amp and acyclovir discontinued per ID recs - Continue vanc (2/22>), CTX (2/21) - goal to de-escalate per ID - F/u HIV, Hep C  # Multiple pulmonary emboli: Patient with new oxygen requirement, hx of DVT, non-compliance with Xarelto, and febrile. Found on CTA 2/22. Small nonobstruction pulmonary emboli in branches of LLL pulmonary artery; no demonstrable R heart strain. Started on heparin gtt. Now on RA with sats in high 90s.  - Continue heparin drip - Continue to monitor respiratory status   # Hypoglycemia: History of insulin dependent diabetes. Suspected to be contributing to altered mental status. Hypoglycemia noted with initial CBG 30--given 1amp of D50 prior to arrival to ED. Another two amps of D50 given and initiated on D10. Last A1C 11.2 on 04/14/15. Prescribed Lantus 45units at night and 10 units with meals at home. Prescribed Gabapentin 8058mTID. CBGs improved to 177, 127, 116, 127 overnight. Currently on D5 1/2NS.  - Hold diabetic medications - Monitor CBG q4 - Consider scheduled sliding scale if hyperglycemia noted - Continue D5 1/2NS while NPO  # HTN: Prescribed Lisinopril 2044mHypertensive overnight, with avg BP high 150s/80s.  - Holdinging oral antihypertensives - Hydralazine PRN - Consider resuming home meds given persistent HTN  # Atrial Fibrillation: Currently prescribed Diltiazem 120m45mily, Xarelto 20mg35md Aspirin 81mg.79montacted cardiology concerning Diltiazem. Will hold at this time. Recommend Metoprolol IV if needed for HR control, however use with caution given cocaine positive - Anticoagulation with heparin gtt - Restart oral home medications when able  # History of Alcohol Use/Illicit Drug Use: UDS positive for cocaine. CIWA scores 15,  4, and 12 overnight.  - CIWA protocol - Begin  scheduled Ativan 1 mg q6  # Stump wound: Wound present on stump of R AKA (performed in 11/16 by Dr. Sharol Given). Patient with history of dry gangrene at that location within past few months. Concern for infection.  - Per wound care, wound dressed with Santyl for chemical debridement of nonviable tissue.  - Re-consult wound care if concern for infection - MRI of R femur  FEN/GI: NPO, D5 1/2NS'@100'  Prophylaxis: heparin gtt  Disposition: Remain in SDU until improvement in mental status  Subjective:  Patient still altered this AM. Does open eyes to name intermittently, but no response otherwise.   Objective: Temp:  [97 F (36.1 C)-98.5 F (36.9 C)] 98.5 F (36.9 C) (02/23 1224) Pulse Rate:  [62-100] 100 (02/23 0749) Resp:  [12-24] 19 (02/23 0749) BP: (156-172)/(74-98) 156/74 mmHg (02/23 0749) SpO2:  [94 %-98 %] 95 % (02/23 0749) Physical Exam: General: lying in bed, opens eyes to name intermittently Cardiovascular: RRR, no murmurs appreciated Respiratory: CTAB, normal work of breathing on RA, no wheezes or rhonchi Abdomen: soft, non-distended, +BS Extremities: R AKA, L BKA Skin: some yellow/light brown discharge, no odor  Neuro: opens eyes to name intermittently; will not follow commands   Laboratory:  Recent Labs Lab 08/03/15 1349 08/03/15 1657 08/04/15 0228 08/05/15 0855  WBC 16.8*  --  11.0* 4.8  HGB 17.5* 18.4* 14.8 15.2  HCT 53.4* 54.0* 45.0 47.0  PLT 234  --  216 180    Recent Labs Lab 08/03/15 1349 08/03/15 1657 08/04/15 0228 08/05/15 0855  NA 136 140 138 139  K 3.9 3.5 3.5 3.6  CL 101 101 104 105  CO2 23  --  22 25  BUN '13 14 9 ' <5*  CREATININE 0.80 0.60* 0.98 0.90  CALCIUM 10.1  --  9.0 8.8*  PROT 8.3*  --  5.7* 5.8*  BILITOT 0.3  --  0.2* 0.3  ALKPHOS 79  --  60 60  ALT 17  --  13* 17  AST 27  --  18 27  GLUCOSE 120* 111* 115* 179*    Imaging/Diagnostic Tests: Ct Head Wo Contrast 08/03/2015 IMPRESSION: No acute intracranial pathology.   Ct Angio  Chest Pe W/cm &/or Wo Cm 08/04/2015  ADDENDUM REPORT: 08/04/2015 07:44 ADDENDUM: Critical Value/emergent results were called by telephone at the time of interpretation on 08/04/2015 at 7:35 AM to Dr. Junie Panning , who verbally acknowledged these results.  08/04/2015 IMPRESSION: Small pulmonary emboli in branches of the left lower lobe pulmonary artery. These small pulmonary emboli are nonobstructing. No more central pulmonary embolus. No demonstrable right heart strain. Prominence of the ascending thoracic aorta with a measured diameter 4.2 x 4.1 cm. Recommend annual imaging followup by CTA or MRA. This recommendation follows 2010 ACCF/AHA/AATS/ACR/ASA/SCA/SCAI/SIR/STS/SVM Guidelines for the Diagnosis and Management of Patients with Thoracic Aortic Disease. Circulation. 2010; 121: e266-e369 8 mm nodular opacity left upper lobe. Followup of this nodular opacity should be based on Fleischner Society guidelines. If the patient is at high risk for bronchogenic carcinoma, follow-up chest CT at 3-45month is recommended. If the patient is at low risk for bronchogenic carcinoma, follow-up chest CT at 6-12 months is recommended. This recommendation follows the consensus statement: Guidelines for Management of Small Pulmonary Nodules Detected on CT Scans: A Statement from the [employer] published in Radiology 2005; 237:395-400. No frank edema or consolidation. Patchy atelectasis bilaterally. No demonstrable adenopathy.    Mr BJeri CosWo Contrast  08/03/2015 IMPRESSION: 1. Motion degraded study. 2. No acute intracranial process identified. 3. 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 4. Mild chronic small vessel ischemic disease.   Dg Chest Port 1 View 08/03/2015 IMPRESSION: No active disease.    Verner Mould, MD 08/05/2015, 12:41 PM PGY-1, [place] Intern pager: [phone], text pages welcome

## 2015-08-04 NOTE — Progress Notes (Signed)
EEG preliminarily reviewed. There is diffuse symmetric, low voltage slowing without electrographic seizures. A single depolarization event along the midline is most consistent with a normal sleep-related discharge. The findings, when correlated clinically, are most consistent with an encephalopathic state, possibly secondary to successive episodes of severe hypoglycemia and/or postictal state.   Official report to follow.   Recommendations: No changes to prior recommendations.   Kerney Elbe, MD

## 2015-08-04 NOTE — Consult Note (Signed)
WOC wound consult note Reason for Consult: Consult requested for right stump wound.  Pt had amputation in 11/16 by Dr Sharol Given of the ortho service, according to the EMR.   Wound type: Full thickness wound over a section of the previous surgical incision. Measurement: 1.5X.3cm Wound bed: 100% loose yellow slough, fluctuant when probed with a swab Drainage (amount, consistency, odor) Small amt tan drainage, no odor Periwound: Intact scar tissue to area surrounding wound Dressing procedure/placement/frequency: Santyl for chemical debridement of nonviable tissue. Contact Dr Sharol Given for further plan of care if desired. Please re-consult if further assistance is needed.  Thank-you,  Julien Girt MSN, [place], [place], [place], [place]

## 2015-08-05 ENCOUNTER — Inpatient Hospital Stay (HOSPITAL_COMMUNITY): Payer: Medicaid Other

## 2015-08-05 DIAGNOSIS — M79604 Pain in right leg: Secondary | ICD-10-CM

## 2015-08-05 DIAGNOSIS — M79609 Pain in unspecified limb: Secondary | ICD-10-CM | POA: Insufficient documentation

## 2015-08-05 DIAGNOSIS — T8789 Other complications of amputation stump: Secondary | ICD-10-CM

## 2015-08-05 LAB — CBC WITH DIFFERENTIAL/PLATELET
BASOS PCT: 2 %
Basophils Absolute: 0.1 10*3/uL (ref 0.0–0.1)
Eosinophils Absolute: 0.1 10*3/uL (ref 0.0–0.7)
Eosinophils Relative: 2 %
HEMATOCRIT: 47 % (ref 39.0–52.0)
HEMOGLOBIN: 15.2 g/dL (ref 13.0–17.0)
LYMPHS ABS: 2.2 10*3/uL (ref 0.7–4.0)
LYMPHS PCT: 45 %
MCH: 27.8 pg (ref 26.0–34.0)
MCHC: 32.3 g/dL (ref 30.0–36.0)
MCV: 86.1 fL (ref 78.0–100.0)
MONOS PCT: 13 %
Monocytes Absolute: 0.6 10*3/uL (ref 0.1–1.0)
NEUTROS ABS: 1.8 10*3/uL (ref 1.7–7.7)
NEUTROS PCT: 38 %
Platelets: 180 10*3/uL (ref 150–400)
RBC: 5.46 MIL/uL (ref 4.22–5.81)
RDW: 15.9 % — ABNORMAL HIGH (ref 11.5–15.5)
WBC: 4.8 10*3/uL (ref 4.0–10.5)

## 2015-08-05 LAB — COMPREHENSIVE METABOLIC PANEL
ALBUMIN: 2.2 g/dL — AB (ref 3.5–5.0)
ALT: 17 U/L (ref 17–63)
ANION GAP: 9 (ref 5–15)
AST: 27 U/L (ref 15–41)
Alkaline Phosphatase: 60 U/L (ref 38–126)
BILIRUBIN TOTAL: 0.3 mg/dL (ref 0.3–1.2)
BUN: <5 mg/dL — ABNORMAL LOW (ref 6–20)
CALCIUM: 8.8 mg/dL — AB (ref 8.9–10.3)
CO2: 25 mmol/L (ref 22–32)
Chloride: 105 mmol/L (ref 101–111)
Creatinine, Ser: 0.9 mg/dL (ref 0.61–1.24)
GFR calc non Af Amer: >60 mL/min (ref >60)
GLUCOSE: 179 mg/dL — AB (ref 65–99)
POTASSIUM: 3.6 mmol/L (ref 3.5–5.1)
SODIUM: 139 mmol/L (ref 135–145)
TOTAL PROTEIN: 5.8 g/dL — AB (ref 6.5–8.1)

## 2015-08-05 LAB — APTT
APTT: 69 s — AB (ref 24–37)
aPTT: 66 seconds — ABNORMAL HIGH (ref 24–37)

## 2015-08-05 LAB — HEMOGLOBIN A1C
HEMOGLOBIN A1C: 9.9 % — AB (ref 4.8–5.6)
MEAN PLASMA GLUCOSE: 237 mg/dL

## 2015-08-05 LAB — GLUCOSE, CAPILLARY
GLUCOSE-CAPILLARY: 127 mg/dL — AB (ref 65–99)
GLUCOSE-CAPILLARY: 116 mg/dL — AB (ref 65–99)
GLUCOSE-CAPILLARY: 127 mg/dL — AB (ref 65–99)
GLUCOSE-CAPILLARY: 129 mg/dL — AB (ref 65–99)
GLUCOSE-CAPILLARY: 148 mg/dL — AB (ref 65–99)
Glucose-Capillary: 127 mg/dL — ABNORMAL HIGH (ref 65–99)
Glucose-Capillary: 135 mg/dL — ABNORMAL HIGH (ref 65–99)
Glucose-Capillary: 148 mg/dL — ABNORMAL HIGH (ref 65–99)
Glucose-Capillary: 152 mg/dL — ABNORMAL HIGH (ref 65–99)
Glucose-Capillary: 177 mg/dL — ABNORMAL HIGH (ref 65–99)

## 2015-08-05 LAB — URINE CULTURE
CULTURE: NO GROWTH
Special Requests: NORMAL

## 2015-08-05 LAB — HIV ANTIBODY (ROUTINE TESTING W REFLEX)
HIV SCREEN 4TH GENERATION: NONREACTIVE
HIV SCREEN 4TH GENERATION: NONREACTIVE

## 2015-08-05 LAB — HEPARIN LEVEL (UNFRACTIONATED): HEPARIN UNFRACTIONATED: 0.52 [IU]/mL (ref 0.30–0.70)

## 2015-08-05 MED ORDER — MORPHINE SULFATE (PF) 2 MG/ML IV SOLN
2.0000 mg | Freq: Once | INTRAVENOUS | Status: AC
  Administered 2015-08-05: 2 mg via INTRAVENOUS
  Filled 2015-08-05: qty 1

## 2015-08-05 MED ORDER — LORAZEPAM 2 MG/ML IJ SOLN: 1.0000 mg | Freq: Four times a day (QID) | INTRAMUSCULAR | Status: DC

## 2015-08-05 MED ORDER — LORAZEPAM 2 MG/ML IJ SOLN
2.0000 mg | Freq: Four times a day (QID) | INTRAMUSCULAR | Status: AC
  Administered 2015-08-05 – 2015-08-07 (×8): 2 mg via INTRAVENOUS
  Filled 2015-08-05 (×8): qty 1

## 2015-08-05 NOTE — Progress Notes (Signed)
Patient arrived back from MRI in bed.  Upon entry into room, patient's bedside cardiac monitor had been removed and not present in the room.  Unit secretary to contact MRI to determine where the monitor is.

## 2015-08-05 NOTE — Progress Notes (Signed)
Subjective: obtunded   Antibiotics:  Anti-infectives    Start     Dose/Rate Route Frequency Ordered Stop   08/04/15 1300  acyclovir (ZOVIRAX) 900 mg in dextrose 5 % 150 mL IVPB  Status:  Discontinued     900 mg 168 mL/hr over 60 Minutes Intravenous Every 8 hours 08/04/15 1135 08/04/15 1628   08/04/15 0000  ampicillin (OMNIPEN) 2 g in sodium chloride 0.9 % 50 mL IVPB  Status:  Discontinued     2 g 150 mL/hr over 20 Minutes Intravenous 6 times per day 08/03/15 2311 08/04/15 1628   08/03/15 2330  vancomycin (VANCOCIN) IVPB 1000 mg/200 mL premix     1,000 mg 200 mL/hr over 60 Minutes Intravenous Every 8 hours 08/03/15 2005     08/03/15 2330  piperacillin-tazobactam (ZOSYN) IVPB 3.375 g  Status:  Discontinued     3.375 g 12.5 mL/hr over 240 Minutes Intravenous Every 8 hours 08/03/15 2005 08/03/15 2122   08/03/15 2315  cefTRIAXone (ROCEPHIN) 2 g in dextrose 5 % 50 mL IVPB     2 g 100 mL/hr over 30 Minutes Intravenous Every 12 hours 08/03/15 2311     08/03/15 1500  vancomycin (VANCOCIN) 2,000 mg in sodium chloride 0.9 % 500 mL IVPB     2,000 mg 250 mL/hr over 120 Minutes Intravenous  Once 08/03/15 1456 08/03/15 1859   08/03/15 1500  piperacillin-tazobactam (ZOSYN) IVPB 3.375 g     3.375 g 100 mL/hr over 30 Minutes Intravenous  Once 08/03/15 1456 08/03/15 1626      Medications: Scheduled Meds: . antiseptic oral rinse  7 mL Mouth Rinse q12n4p  . cefTRIAXone (ROCEPHIN)  IV  2 g Intravenous Q12H  . chlorhexidine  15 mL Mouth Rinse BID  . collagenase   Topical Daily  . folic acid  1 mg Intravenous Daily  . LORazepam  2 mg Intravenous Q6H  . sodium chloride  1,000 mL Intravenous Once  . sodium chloride flush  3 mL Intravenous Q12H  . thiamine  100 mg Intravenous Daily  . vancomycin  1,000 mg Intravenous Q8H   Continuous Infusions: . dextrose 5 % and 0.45% NaCl 1,000 mL infusion 100 mL/hr at 08/05/15 0924  . heparin 1,650 Units/hr (08/05/15 1217)   PRN  Meds:.acetaminophen, hydrALAZINE, LORazepam    Objective: Weight change:   Intake/Output Summary (Last 24 hours) at 08/05/15 1646 Last data filed at 08/05/15 1542  Gross per 24 hour  Intake 2487.35 ml  Output   2525 ml  Net -37.65 ml   Blood pressure 126/75, pulse 69, temperature 97.1 F (36.2 C), temperature source Axillary, resp. rate 22, height 6\' 4"  (1.93 m), weight 201 lb 15.1 oz (91.6 kg), SpO2 95 %. Temp:  [97 F (36.1 C)-98.5 F (36.9 C)] 97.1 F (36.2 C) (02/23 1535) Pulse Rate:  [62-100] 69 (02/23 1535) Resp:  [12-24] 22 (02/23 1535) BP: (126-172)/(66-98) 126/75 mmHg (02/23 1535) SpO2:  [94 %-98 %] 95 % (02/23 1535)  Physical Exam: General: Obtunded and less responsive today but had sedatives not long before I examined him this morning HEENT: anicteric sclera Cardiovascular:Irr irr rhythm, no murmur rubs or gallops Pulmonary: clear to auscultation bilaterally, no wheezing, rales or rhonchi Gastrointestinal: soft nontender, nondistended, normal bowel sounds, Musculoskeletal/skin: stumps right with dressing Neuro: nonfocal  CBC:  CBC Latest Ref Rng 08/05/2015 08/04/2015 08/03/2015  WBC 4.0 - 10.5 K/uL 4.8 11.0(H) -  Hemoglobin 13.0 - 17.0 g/dL 15.2 14.8 18.4(H)  Hematocrit  39.0 - 52.0 % 47.0 45.0 54.0(H)  Platelets 150 - 400 K/uL 180 216 -       BMET  Recent Labs  08/04/15 0228 08/05/15 0855  NA 138 139  K 3.5 3.6  CL 104 105  CO2 22 25  GLUCOSE 115* 179*  BUN 9 <5*  CREATININE 0.98 0.90  CALCIUM 9.0 8.8*     Liver Panel   Recent Labs  08/04/15 0228 08/05/15 0855  PROT 5.7* 5.8*  ALBUMIN 2.1* 2.2*  AST 18 27  ALT 13* 17  ALKPHOS 60 60  BILITOT 0.2* 0.3       Sedimentation Rate No results for input(s): ESRSEDRATE in the last 72 hours. C-Reactive Protein No results for input(s): CRP in the last 72 hours.  Micro Results: Recent Results (from the past 720 hour(s))  Urine culture     Status: None   Collection Time: 08/03/15  12:30 PM  Result Value Ref Range Status   Specimen Description URINE, RANDOM  Final   Special Requests Normal  Final   Culture NO GROWTH 2 DAYS  Final   Report Status 08/05/2015 FINAL  Final  Blood Culture (routine x 2)     Status: None (Preliminary result)   Collection Time: 08/03/15  2:30 PM  Result Value Ref Range Status   Specimen Description BLOOD LEFT ANTECUBITAL  Final   Special Requests BOTTLES DRAWN AEROBIC AND ANAEROBIC 5CC  Final   Culture NO GROWTH 2 DAYS  Final   Report Status PENDING  Incomplete  Blood Culture (routine x 2)     Status: None (Preliminary result)   Collection Time: 08/03/15  3:00 PM  Result Value Ref Range Status   Specimen Description BLOOD LEFT HAND  Final   Special Requests BOTTLES DRAWN AEROBIC AND ANAEROBIC 5CC  Final   Culture NO GROWTH 2 DAYS  Final   Report Status PENDING  Incomplete  MRSA PCR Screening     Status: None   Collection Time: 08/04/15 11:19 AM  Result Value Ref Range Status   MRSA by PCR NEGATIVE NEGATIVE Final    Comment:        The GeneXpert MRSA Assay (FDA approved for NASAL specimens only), is one component of a comprehensive MRSA colonization surveillance program. It is not intended to diagnose MRSA infection nor to guide or monitor treatment for MRSA infections.     Studies/Results: Ct Angio Chest Pe W/cm &/or Wo Cm  08/04/2015  ADDENDUM REPORT: 08/04/2015 07:44 ADDENDUM: Critical Value/emergent results were called by telephone at the time of interpretation on 08/04/2015 at 7:35 AM to Dr. Junie Panning , who verbally acknowledged these results. Electronically Signed   By: Lowella Grip III M.D.   On: 08/04/2015 07:44  08/04/2015  CLINICAL DATA:  Shortness of breath and cough EXAM: CT ANGIOGRAPHY CHEST WITH CONTRAST TECHNIQUE: Multidetector CT imaging of the chest was performed using the standard protocol during bolus administration of intravenous contrast. Multiplanar CT image reconstructions and MIPs were obtained to  evaluate the vascular anatomy. CONTRAST:  43mL OMNIPAQUE IOHEXOL 350 MG/ML SOLN COMPARISON:  Chest radiograph August 03, 2015 FINDINGS: Mediastinum/Lymph Nodes: There are small pulmonary emboli in several branches of the left lower lobe pulmonary artery. There is no major vessel pulmonary embolus. There is no demonstrable right heart strain. There is prominence of the ascending thoracic aorta with a measured diameter of 4.2 x 4.1 cm. No thoracic aortic dissection is evident on this study. The proximal great vessels appear unremarkable except for  mild calcification at their respective origins. There is left ventricular hypertrophy. There is no pericardial effusion. There is no appreciable thoracic adenopathy. Several small subcentimeter lymph nodes are present which do not meet size criteria for pathologic significance. The thyroid appears unremarkable. Lungs/Pleura: There is patchy atelectatic change bilaterally. There is no well-defined edema or consolidation. There is a nodular opacity in the right upper lobe measuring 8 mm on axial slice 55 series 9. Upper abdomen: Visualized upper abdominal structures appear unremarkable. Musculoskeletal: There are no blastic or lytic bone lesions. Review of the MIP images confirms the above findings. IMPRESSION: Small pulmonary emboli in branches of the left lower lobe pulmonary artery. These small pulmonary emboli are nonobstructing. No more central pulmonary embolus. No demonstrable right heart strain. Prominence of the ascending thoracic aorta with a measured diameter 4.2 x 4.1 cm. Recommend annual imaging followup by CTA or MRA. This recommendation follows 2010 ACCF/AHA/AATS/ACR/ASA/SCA/SCAI/SIR/STS/SVM Guidelines for the Diagnosis and Management of Patients with Thoracic Aortic Disease. Circulation. 2010; 121: [id number] 8 mm nodular opacity left upper lobe. Followup of this nodular opacity should be based on Fleischner Society guidelines. If the patient is at high risk  for bronchogenic carcinoma, follow-up chest CT at 3-20months is recommended. If the patient is at low risk for bronchogenic carcinoma, follow-up chest CT at 6-12 months is recommended. This recommendation follows the consensus statement: Guidelines for Management of Small Pulmonary Nodules Detected on CT Scans: A Statement from the [place] as published in Radiology 2005; 237:395-400. No frank edema or consolidation. Patchy atelectasis bilaterally. No demonstrable adenopathy. Electronically Signed: By: Lowella Grip III M.D. On: 08/04/2015 07:31   Mr Jeri Cos [id number] Contrast  08/03/2015  CLINICAL DATA:  Initial evaluation for seizure. EXAM: MRI HEAD WITHOUT AND WITH CONTRAST TECHNIQUE: Multiplanar, multiecho pulse sequences of the brain and surrounding structures were obtained without and with intravenous contrast. CONTRAST:  91mL MULTIHANCE GADOBENATE DIMEGLUMINE 529 MG/ML IV SOLN COMPARISON:  Prior CT from earlier the same day. FINDINGS: Study moderate to severely degraded by motion artifact. Mild diffuse prominence of the CSF containing spaces is compatible with generalized age-related cerebral atrophy. Patchy T2/FLAIR hyperintensity within the periventricular and deep white matter both cerebral hemispheres most consistent with chronic small vessel ischemic disease, mild nature. Small focus of encephalomalacia with gliosis present within the cortical gray matter of the left operculum region, most consistent with a small remote infarct. No other definite remote infarcts identified. No abnormal foci of restricted diffusion to suggest acute intracranial infarct. Gray-white matter differentiation maintained. Major intracranial vascular flow voids are preserved. No acute or chronic intracranial hemorrhage. No mass lesion, midline shift, or mass effect. Hippocampi grossly normal in appearance with normal signal intensity. No hydrocephalus. No extra-axial fluid collection. No definite abnormal enhancement,  although evaluation fairly limited due to motion artifact. Craniocervical junction within normal limits. Pituitary gland normal. No acute abnormality about the orbits. Paranasal sinuses are grossly clear.  No mastoid effusion. Bone marrow signal intensity within normal limits. No acute scalp soft tissue abnormality para small lipoma noted within the right periorbital region. IMPRESSION: 1. Motion degraded study. 2. No acute intracranial process identified. 3. 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 4. Mild chronic small vessel ischemic disease. Electronically Signed   By: Jeannine Boga M.D.   On: 08/03/2015 22:39      Assessment/Plan:  INTERVAL HISTORY:  08/05/15: obtundation persists though he is also receiving sedatives for agitation   Active Problems:   Altered mental status  Lung nodule seen on imaging study   Seizure ([place])   Tachycardia   FUO (fever of unknown origin)   Meningoencephalitis   Polysubstance abuse   Hypoglycemia    Steven [name] is a 55 y.o. male with poorly controlled diabetes mellitus, paroxysmal atrial fibrillation polysubstance abuse including cocaine and alcohol use who had been found unresponsive at home with a Glasgow Coma Scale, scale of 9 with extremely low blood sugars and possible seizure activitiy, he was also febrile and there was concern for possible SIRS and later for meningoencephaltiis  #1 Obtundation with concern for possible meningoencephalitis:  My impression is that he hasother reasons why he may be obtunded rather than meningoencephalitis namely his extremely low blood sugars his possible seizures and other metabolic derangements.  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  I have DC his ampicillin and his acyclovir and there has been no return of fever  His  confusion persists and I wonder if he has sustained significant cortical brain damage but it is hard to make such judgements acutely esp given that he is also receiving sedating medications to clam him when he is agitated  #2 Fevers: I have concern that his stump site may be infected again given tender he was in this region. He was NOT able to cooperate for MRI, plain film planned      LOS: 2 days   Alcide Evener 08/05/2015, 4:46 PM

## 2015-08-05 NOTE — Progress Notes (Signed)
Called Orthopedic, spoke with the on-call Doctor for [facility] to discuss the femur xray results. 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 Dr. Sharol Given to evaluate tomorrow  Morning.

## 2015-08-05 NOTE — Progress Notes (Signed)
Went to see patient this afternoon. He did not respond, even to sternal rub, however has received multiple doses of Ativan throughout the day today due to agitation, and had recently received morphine. Patient's nurse is concerned that he is experiencing pain in R AKA stump, as he withdraws whenever his leg is touched, and quickly calmed down after receiving morphine.  MRI R femur was attempted, however patient was not able to remain still. Will attempt plain film R femur. Spoke with neuro, who will continue to follow patient.

## 2015-08-05 NOTE — Progress Notes (Signed)
ANTICOAGULATION CONSULT NOTE - Follow Up Consult  Pharmacy Consult for heparin  Indication: PE  No Known Allergies  Patient Measurements: Height: 6\' 4"  (193 cm) Weight: 201 lb 15.1 oz (91.6 kg) IBW/kg (Calculated) : 86.8  Vital Signs: Temp: 97 F (36.1 C) (02/23 0749) Temp Source: Axillary (02/23 0749) BP: 156/74 mmHg (02/23 0749) Pulse Rate: 100 (02/23 0749)  Labs:  Recent Labs  08/03/15 1349 08/03/15 1657 08/03/15 2237 08/04/15 0228  08/04/15 0828 08/04/15 1337 08/04/15 1525 08/04/15 2220 08/05/15 0619 08/05/15 0855  HGB 17.5* 18.4*  --  14.8  --   --   --   --   --   --  15.2  HCT 53.4* 54.0*  --  45.0  --   --   --   --   --   --  47.0  PLT 234  --   --  216  --   --   --   --   --   --  180  APTT  --   --   --   --   < > 35 24 36 46* 66*  --   LABPROT  --   --   --   --   --   --  13.9  --   --   --   --   INR  --   --   --   --   --   --  1.05  --   --   --   --   HEPARINUNFRC  --   --   --   --   --  0.60  --   --   --  0.52  --   CREATININE 0.80 0.60*  --  0.98  --   --   --   --   --   --  0.90  CKTOTAL 183  --   --   --   --   --   --   --   --   --   --   TROPONINI 0.04*  --  0.05* 0.07*  --  0.05*  --   --   --   --   --   < > = values in this interval not displayed.  Estimated Creatinine Clearance: 112.5 mL/min (by C-G formula based on Cr of 0.9).   Medications:  Scheduled:  . antiseptic oral rinse  7 mL Mouth Rinse q12n4p  . cefTRIAXone (ROCEPHIN)  IV  2 g Intravenous Q12H  . chlorhexidine  15 mL Mouth Rinse BID  . collagenase   Topical Daily  . folic acid  1 mg Intravenous Daily  . LORazepam  2 mg Intravenous Q6H  . sodium chloride  1,000 mL Intravenous Once  . sodium chloride flush  3 mL Intravenous Q12H  . thiamine  100 mg Intravenous Daily  . vancomycin  1,000 mg Intravenous Q8H    Assessment: 56 yo  Male with PE and r/o meningitis. He is on heparin and aPTT at the low end of goal (aPTT= 66). Heparin level 0.52 but may still be seeing  some effect of PTA xarelto.  Goal of Therapy:  Heparin level 0.3-0.7 units/ml aPTT 66-102 seconds Monitor platelets by anticoagulation protocol: Yes   Plan:  Increase heparin to 1650 units/hr 6h aPTT Daily aPTT/HL/CBC  Hildred Laser, Pharm D 08/05/2015 10:54 AM

## 2015-08-05 NOTE — Progress Notes (Signed)
ANTICOAGULATION CONSULT NOTE - Follow Up Consult  Pharmacy Consult for heparin  Indication: PE  No Known Allergies  Patient Measurements: Height: 6\' 4"  (193 cm) Weight: 201 lb 15.1 oz (91.6 kg) IBW/kg (Calculated) : 86.8  Vital Signs: Temp: 97.1 F (36.2 C) (02/23 1535) Temp Source: Axillary (02/23 1535) BP: 126/75 mmHg (02/23 1535) Pulse Rate: 69 (02/23 1535)  Labs:  Recent Labs  08/03/15 1349 08/03/15 1657 08/03/15 2237 08/04/15 0228  08/04/15 0828 08/04/15 1337  08/04/15 2220 08/05/15 0619 08/05/15 0855 08/05/15 1807  HGB 17.5* 18.4*  --  14.8  --   --   --   --   --   --  15.2  --   HCT 53.4* 54.0*  --  45.0  --   --   --   --   --   --  47.0  --   PLT 234  --   --  216  --   --   --   --   --   --  180  --   APTT  --   --   --   --   < > 35 24  < > 46* 66*  --  69*  LABPROT  --   --   --   --   --   --  13.9  --   --   --   --   --   INR  --   --   --   --   --   --  1.05  --   --   --   --   --   HEPARINUNFRC  --   --   --   --   --  0.60  --   --   --  0.52  --   --   CREATININE 0.80 0.60*  --  0.98  --   --   --   --   --   --  0.90  --   CKTOTAL 183  --   --   --   --   --   --   --   --   --   --   --   TROPONINI 0.04*  --  0.05* 0.07*  --  0.05*  --   --   --   --   --   --   < > = values in this interval not displayed.  Estimated Creatinine Clearance: 112.5 mL/min (by C-G formula based on Cr of 0.9).   Assessment: 55 yo  Male with PE and r/o meningitis.   aPTT is therapeutic  at 69 seconds after heparin rate increased to 1650 units/hr. No bleeding reported.  Goal of Therapy:  Heparin level 0.3-0.7 units/ml aPTT 66-102 seconds Monitor platelets by anticoagulation protocol: Yes   Plan:  Continue heparin at 1650 units/hr Daily aPTT/HL/CBC  Eudelia Bunch, Pharm.D. [id number] 08/05/2015 7:15 PM

## 2015-08-05 NOTE — Progress Notes (Signed)
IV to right forearm found to be infiltrated.  This is the IV site with the Vancomycin infusing.  IV d/cd and order for IV team consult entered.

## 2015-08-05 NOTE — Progress Notes (Signed)
Family Medicine Teaching Service Daily Progress Note Intern Pager: [phone]  Patient name: Steven [name] Medical record number: [id number] Date of birth: 03-27-1961 Age: 56 y.o. Gender: male  Primary Care Provider: Chrisandra Netters, MD Consultants: Neurology, ID, ortho Code Status: FULL  Pt Overview and Major Events to Date:  2/21 - admitted to [facility]; one dose of zosyn; started CTX 2/22 - started vanc, amp, acyclovir; acyclovir and amp d/ced  Assessment and Plan: Steven [name] is a 55 y.o. male presenting with altered mental status. 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 GCS score 9. Hypoglycemia managed as below, suspected to be contributing. Decorticate posturing noted in ED, resolved with 82m of Ativan. Leukocytosis of 16.8 noted. Troponin 0.04, 0.05, 0.07. Lactic acid 3.21 initially, improved to 1.98. Ammonia, CK, TSH WNL. ABG with pO2 71, pH 7.4, pCO2 36.4, bicarb 22.6. Urine positive for cocaine. CT head negative. MRI with no acute intracranial process. CXR negative. Many etiologies in differential including Lantus overdose, EtOH intoxication/withdrawal, cocaine use, seizures, infection, meningitis. SIRS criteria met with HR, respiratory rate, leukocytosis, and initial lactic acidosis. qSOFA of 1 due to altered mental status. EEG showed findings most consistent with encephalopathy, probably due to either persistent hypoglycemia or post-ictal state. Decision was made to treat patient empirically for meningitis, so LP was not performed. Patient seen by ID, who feel this is not meningitis, and more likely result of hypoglycemia with concomitant seizures, other metabolic derangements, or 2/2 stump infection.  - Monitor vitals - NPO pending improved mental status - Neuro checks - Follow up blood cultures, urine culture - NGx2d - Neurology  consulted, appreciate recommendations - ID consulted, appreciate recommendations  - Acetaminophen suppository PRN fever - Continue vanc (2/22>), CTX (2/21) - goal to de-escalate per ID  # Multiple pulmonary emboli: Patient with new oxygen requirement, hx of DVT, non-compliance with Xarelto, and febrile. Found on CTA 2/22. Small nonobstruction pulmonary emboli in branches of LLL pulmonary artery; no demonstrable R heart strain. Started on heparin gtt. Breathing comfortably on RA with sats in high 90s.   - Continue heparin drip - Continue to monitor respiratory status   # Hypoglycemia: History of insulin dependent diabetes. Suspected to be contributing to altered mental status. Hypoglycemia noted with initial CBG 30. Last A1C 11.2 on 04/14/15. Prescribed Lantus 45units at night and 10 units with meals at home. Prescribed Gabapentin 8065mTID. CBGs 153, 196, 201 overnight. Currently on D5 1/2NS.  - Hold diabetic medications - Monitor CBG q4 - Consider scheduled sliding scale if hyperglycemia noted - Continue D5 1/2NS while NPO  # HTN: Prescribed Lisinopril 2017mHypertensive overnight, with avg BP 150s/80s.  - Holdinging oral antihypertensives - Hydralazine PRN  # Atrial Fibrillation: Currently prescribed Diltiazem 120m74mily, Xarelto 20mg32md Aspirin 81mg.47montacted cardiology concerning Diltiazem. Will hold at this time. Recommend Metoprolol IV if needed for HR control, however use with caution given cocaine positive. - Anticoagulation with heparin gtt - Restart oral home medications when able  # History of Alcohol Use/Illicit Drug Use: UDS positive for cocaine. Patient requiring multiple doses of Ativan yesterday for agitation. CIWA scores 4, 5, 0, 10 overnight.  - CIWA protocol - Begin scheduled Ativan 2mg q688m Stump wound: Wound present on stump of R AKA (performed in 11/16 by Dr. [name]). Sharol Givenent with history of dry gangrene at that location  within past few months. Concern for  infection. Unable to obtain MRI of R femur yesterday as patient could not remain still. Did obtain plain film of R femur, which showed possible osteo of distal R femur vs necrotizing fasciitis. Seen by ortho, who recommended continuing abx and Santyl dressings.    - Per ortho, continue Santyl dressings, and current abx sufficient to cover possible infection - CT femur  FEN/GI: NPO, D5 1/2NS'@100'  Prophylaxis: heparin gtt  Disposition: Remain in SDU until improvement in mental status  Subjective:  Patient's neuro status mostly unchanged from yesterday. Does open eyes to touch, but not to name. Patient very agitated throughout the day yesterday, requiring multiple doses of Ativan and one dose of morphine. Concern for pain at R AKA stumps as cause for agitation, as patient rested comfortably after receiving morphine.   Objective: Temp:  [97.1 F (36.2 C)-97.9 F (36.6 C)] 97.5 F (36.4 C) (02/24 1145) Pulse Rate:  [68-79] 68 (02/24 1145) Resp:  [12-22] 20 (02/24 1145) BP: (126-163)/(66-93) 161/86 mmHg (02/24 1145) SpO2:  [94 %-99 %] 94 % (02/24 1145) Physical Exam: General: lying in bed, moving occasionally Cardiovascular: RRR, no murmurs appreciated Respiratory: CTAB, normal work of breathing on RA, no wheezes or rhonchi Abdomen: soft, non-distended, +BS Extremities: R AKA with clean and dry bandage on stump; L BKA Neuro: opens eyes only when placed stethoscope on chest; will not follow commands   Laboratory:  Recent Labs Lab 08/04/15 0228 08/05/15 0855 08/06/15 0619  WBC 11.0* 4.8 5.2  HGB 14.8 15.2 15.9  HCT 45.0 47.0 48.5  PLT 216 180 213    Recent Labs Lab 08/04/15 0228 08/05/15 0855 08/06/15 0619  NA 138 139 139  K 3.5 3.6 3.5  CL 104 105 110  CO2 '22 25 22  ' BUN 9 <5* <5*  CREATININE 0.98 0.90 0.92  CALCIUM 9.0 8.8* 8.8*  PROT 5.7* 5.8* 6.2*  BILITOT 0.2* 0.3 0.3  ALKPHOS 60 60 62  ALT 13* 17 15*  AST '18 27 20  ' GLUCOSE 115* 179* 202*    Imaging/Diagnostic  Tests: Ct Head Wo Contrast 08/03/2015 IMPRESSION: No acute intracranial pathology.   Ct Angio Chest Pe W/cm &/or Wo Cm 08/04/2015  ADDENDUM REPORT: 08/04/2015 07:44 ADDENDUM: Critical Value/emergent results were called by telephone at the time of interpretation on 08/04/2015 at 7:35 AM to Dr. Junie Panning , who verbally acknowledged these results.  08/04/2015 IMPRESSION: Small pulmonary emboli in branches of the left lower lobe pulmonary artery. These small pulmonary emboli are nonobstructing. No more central pulmonary embolus. No demonstrable right heart strain. Prominence of the ascending thoracic aorta with a measured diameter 4.2 x 4.1 cm. Recommend annual imaging followup by CTA or MRA. This recommendation follows 2010 ACCF/AHA/AATS/ACR/ASA/SCA/SCAI/SIR/STS/SVM Guidelines for the Diagnosis and Management of Patients with Thoracic Aortic Disease. Circulation. 2010; 121: e266-e369 8 mm nodular opacity left upper lobe. Followup of this nodular opacity should be based on Fleischner Society guidelines. If the patient is at high risk for bronchogenic carcinoma, follow-up chest CT at 3-5month is recommended. If the patient is at low risk for bronchogenic carcinoma, follow-up chest CT at 6-12 months is recommended. This recommendation follows the consensus statement: Guidelines for Management of Small Pulmonary Nodules Detected on CT Scans: A Statement from the [place] published in Radiology 2005; 237:395-400. No frank edema or consolidation. Patchy atelectasis bilaterally. No demonstrable adenopathy.    Mr BJeri CosWo Contrast 08/03/2015 IMPRESSION: 1. Motion degraded study. 2. No acute intracranial process identified.  3. 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 4. Mild chronic small vessel ischemic disease.   Dg Chest Port 1 View 08/03/2015 IMPRESSION: No active disease.    Verner Mould, MD 08/06/2015, 12:49 PM PGY-1, [place]  Intern pager: [phone], text pages welcome

## 2015-08-05 NOTE — Progress Notes (Signed)
Unit cardiac monitor for patient returned from MRI.  Pt placed back on monitor.

## 2015-08-06 ENCOUNTER — Inpatient Hospital Stay (HOSPITAL_COMMUNITY): Payer: Medicaid Other

## 2015-08-06 LAB — COMPREHENSIVE METABOLIC PANEL
ALT: 15 U/L — AB (ref 17–63)
AST: 20 U/L (ref 15–41)
Albumin: 2.2 g/dL — ABNORMAL LOW (ref 3.5–5.0)
Alkaline Phosphatase: 62 U/L (ref 38–126)
Anion gap: 7 (ref 5–15)
BUN: <5 mg/dL — ABNORMAL LOW (ref 6–20)
CHLORIDE: 110 mmol/L (ref 101–111)
CO2: 22 mmol/L (ref 22–32)
CREATININE: 0.92 mg/dL (ref 0.61–1.24)
Calcium: 8.8 mg/dL — ABNORMAL LOW (ref 8.9–10.3)
Glucose, Bld: 202 mg/dL — ABNORMAL HIGH (ref 65–99)
Potassium: 3.5 mmol/L (ref 3.5–5.1)
Sodium: 139 mmol/L (ref 135–145)
Total Bilirubin: 0.3 mg/dL (ref 0.3–1.2)
Total Protein: 6.2 g/dL — ABNORMAL LOW (ref 6.5–8.1)

## 2015-08-06 LAB — GLUCOSE, CAPILLARY
GLUCOSE-CAPILLARY: 153 mg/dL — AB (ref 65–99)
GLUCOSE-CAPILLARY: 184 mg/dL — AB (ref 65–99)
GLUCOSE-CAPILLARY: 196 mg/dL — AB (ref 65–99)
GLUCOSE-CAPILLARY: 201 mg/dL — AB (ref 65–99)
Glucose-Capillary: 196 mg/dL — ABNORMAL HIGH (ref 65–99)
Glucose-Capillary: 176 mg/dL — ABNORMAL HIGH (ref 65–99)

## 2015-08-06 LAB — CBC
HCT: 48.5 % (ref 39.0–52.0)
HEMOGLOBIN: 15.9 g/dL (ref 13.0–17.0)
MCH: 28.6 pg (ref 26.0–34.0)
MCHC: 32.8 g/dL (ref 30.0–36.0)
MCV: 87.2 fL (ref 78.0–100.0)
PLATELETS: 213 10*3/uL (ref 150–400)
RBC: 5.56 MIL/uL (ref 4.22–5.81)
RDW: 15.7 % — AB (ref 11.5–15.5)
WBC: 5.2 10*3/uL (ref 4.0–10.5)

## 2015-08-06 LAB — HEPATITIS C ANTIBODY (REFLEX)
HCV Ab: 0.1 s/co ratio (ref 0.0–0.9)
HCV Ab: 0.1 s/co ratio (ref 0.0–0.9)

## 2015-08-06 LAB — APTT: APTT: 89 s — AB (ref 24–37)

## 2015-08-06 LAB — HEPARIN LEVEL (UNFRACTIONATED): HEPARIN UNFRACTIONATED: 0.63 [IU]/mL (ref 0.30–0.70)

## 2015-08-06 LAB — HCV COMMENT:

## 2015-08-06 MED ORDER — LORAZEPAM 2 MG/ML IJ SOLN
1.0000 mg | Freq: Once | INTRAMUSCULAR | Status: AC
  Administered 2015-08-06: 1 mg via INTRAVENOUS

## 2015-08-06 MED ORDER — LORAZEPAM 2 MG/ML IJ SOLN
1.0000 mg | Freq: Once | INTRAMUSCULAR | Status: AC
  Administered 2015-08-06: 1 mg via INTRAVENOUS
  Filled 2015-08-06: qty 1

## 2015-08-06 MED ORDER — COLLAGENASE 250 UNIT/GM EX OINT
TOPICAL_OINTMENT | Freq: Every day | CUTANEOUS | Status: DC
Start: 2015-08-06 — End: 2015-08-06

## 2015-08-06 NOTE — Progress Notes (Signed)
FPTS Interim Progress Note  S: Went to see patient. He was not able to get CT Femur this evening despite getting total dose of Ativan 3mg . Per nursing, patient would get very agitated when staff attempted to move him from his bed to the imaging table. Saw patient after returning from CT. Patient was sleeping. He did open his eyes to a gentle tap on his arm and her would look around but he did not did not respond verbally and did not follow command.   O: BP 151/78 mmHg  Pulse 78  Temp(Src) 97.6 F (36.4 C) (Axillary)  Resp 22  Ht 6\' 4"  (1.93 m)  Wt 91.6 kg (201 lb 15.1 oz)  BMI 24.59 kg/m2  SpO2 99%  Gen: NAD, has mitts on hands for protection Cardiac; RRR, no m/r/g Lungs: Normal effort, CTAB anteriorly bilaterally   Smiley Houseman, MD 08/06/2015, 2:22 AM PGY-1, [place] Medicine Service pager [phone]

## 2015-08-06 NOTE — Progress Notes (Signed)
Patient observed staring off to his right into space, waving arms, moving legs, making some very soft groaning like sounds.  Then looking forward moving all extremities. I made multiple attempts to verbally interact with him.  He made no attempts to answer any questions.  I re-oriented the patient to place, month, year, and situation.  Patient did not display any signs he comprehended anything that was said.

## 2015-08-06 NOTE — Progress Notes (Signed)
Subjective: Obtunded, not improved   Antibiotics:  Anti-infectives    Start     Dose/Rate Route Frequency Ordered Stop   08/04/15 1300  acyclovir (ZOVIRAX) 900 mg in dextrose 5 % 150 mL IVPB  Status:  Discontinued     900 mg 168 mL/hr over 60 Minutes Intravenous Every 8 hours 08/04/15 1135 08/04/15 1628   08/04/15 0000  ampicillin (OMNIPEN) 2 g in sodium chloride 0.9 % 50 mL IVPB  Status:  Discontinued     2 g 150 mL/hr over 20 Minutes Intravenous 6 times per day 08/03/15 2311 08/04/15 1628   08/03/15 2330  vancomycin (VANCOCIN) IVPB 1000 mg/200 mL premix     1,000 mg 200 mL/hr over 60 Minutes Intravenous Every 8 hours 08/03/15 2005     08/03/15 2330  piperacillin-tazobactam (ZOSYN) IVPB 3.375 g  Status:  Discontinued     3.375 g 12.5 mL/hr over 240 Minutes Intravenous Every 8 hours 08/03/15 2005 08/03/15 2122   08/03/15 2315  cefTRIAXone (ROCEPHIN) 2 g in dextrose 5 % 50 mL IVPB     2 g 100 mL/hr over 30 Minutes Intravenous Every 12 hours 08/03/15 2311     08/03/15 1500  vancomycin (VANCOCIN) 2,000 mg in sodium chloride 0.9 % 500 mL IVPB     2,000 mg 250 mL/hr over 120 Minutes Intravenous  Once 08/03/15 1456 08/03/15 1859   08/03/15 1500  piperacillin-tazobactam (ZOSYN) IVPB 3.375 g     3.375 g 100 mL/hr over 30 Minutes Intravenous  Once 08/03/15 1456 08/03/15 1626      Medications: Scheduled Meds: . antiseptic oral rinse  7 mL Mouth Rinse q12n4p  . cefTRIAXone (ROCEPHIN)  IV  2 g Intravenous Q12H  . chlorhexidine  15 mL Mouth Rinse BID  . collagenase   Topical Daily  . folic acid  1 mg Intravenous Daily  . LORazepam  2 mg Intravenous Q6H  . sodium chloride  1,000 mL Intravenous Once  . sodium chloride flush  3 mL Intravenous Q12H  . thiamine  100 mg Intravenous Daily  . vancomycin  1,000 mg Intravenous Q8H   Continuous Infusions: . dextrose 5 % and 0.45% NaCl 1,000 mL infusion 100 mL/hr at 08/06/15 0943  . heparin 1,650 Units/hr (08/06/15 1242)   PRN  Meds:.acetaminophen, hydrALAZINE, LORazepam    Objective: Weight change:   Intake/Output Summary (Last 24 hours) at 08/06/15 1333 Last data filed at 08/06/15 1036  Gross per 24 hour  Intake 3373.22 ml  Output   2926 ml  Net 447.22 ml   Blood pressure 161/86, pulse 68, temperature 97.5 F (36.4 C), temperature source Axillary, resp. rate 20, height 6\' 4"  (1.93 m), weight 201 lb 15.1 oz (91.6 kg), SpO2 94 %. Temp:  [97.1 F (36.2 C)-97.9 F (36.6 C)] 97.5 F (36.4 C) (02/24 1145) Pulse Rate:  [68-79] 68 (02/24 1145) Resp:  [12-22] 20 (02/24 1145) BP: (126-163)/(66-93) 161/86 mmHg (02/24 1145) SpO2:  [94 %-99 %] 94 % (02/24 1145)  Physical Exam: General: Obtunded and less responsive today but had sedatives not long before I examined him this morning HEENT: anicteric sclera Cardiovascular:Irr irr rhythm, no murmur rubs or gallops Pulmonary: clear to auscultation bilaterally, no wheezing, rales or rhonchi Gastrointestinal: soft nontender, nondistended, normal bowel sounds, Musculoskeletal/skin: stumps right with dressing Neuro: nonfocal  CBC:  CBC Latest Ref Rng 08/06/2015 08/05/2015 08/04/2015  WBC 4.0 - 10.5 K/uL 5.2 4.8 11.0(H)  Hemoglobin 13.0 - 17.0 g/dL 15.9 15.2 14.8  Hematocrit 39.0 - 52.0 % 48.5 47.0 45.0  Platelets 150 - 400 K/uL 213 180 216       BMET  Recent Labs  08/05/15 0855 08/06/15 0619  NA 139 139  K 3.6 3.5  CL 105 110  CO2 25 22  GLUCOSE 179* 202*  BUN <5* <5*  CREATININE 0.90 0.92  CALCIUM 8.8* 8.8*     Liver Panel   Recent Labs  08/05/15 0855 08/06/15 0619  PROT 5.8* 6.2*  ALBUMIN 2.2* 2.2*  AST 27 20  ALT 17 15*  ALKPHOS 60 62  BILITOT 0.3 0.3       Sedimentation Rate No results for input(s): ESRSEDRATE in the last 72 hours. C-Reactive Protein No results for input(s): CRP in the last 72 hours.  Micro Results: Recent Results (from the past 720 hour(s))  Urine culture     Status: None   Collection Time: 08/03/15  12:30 PM  Result Value Ref Range Status   Specimen Description URINE, RANDOM  Final   Special Requests Normal  Final   Culture NO GROWTH 2 DAYS  Final   Report Status 08/05/2015 FINAL  Final  Blood Culture (routine x 2)     Status: None (Preliminary result)   Collection Time: 08/03/15  2:30 PM  Result Value Ref Range Status   Specimen Description BLOOD LEFT ANTECUBITAL  Final   Special Requests BOTTLES DRAWN AEROBIC AND ANAEROBIC 5CC  Final   Culture NO GROWTH 2 DAYS  Final   Report Status PENDING  Incomplete  Blood Culture (routine x 2)     Status: None (Preliminary result)   Collection Time: 08/03/15  3:00 PM  Result Value Ref Range Status   Specimen Description BLOOD LEFT HAND  Final   Special Requests BOTTLES DRAWN AEROBIC AND ANAEROBIC 5CC  Final   Culture NO GROWTH 2 DAYS  Final   Report Status PENDING  Incomplete  MRSA PCR Screening     Status: None   Collection Time: 08/04/15 11:19 AM  Result Value Ref Range Status   MRSA by PCR NEGATIVE NEGATIVE Final    Comment:        The GeneXpert MRSA Assay (FDA approved for NASAL specimens only), is one component of a comprehensive MRSA colonization surveillance program. It is not intended to diagnose MRSA infection nor to guide or monitor treatment for MRSA infections.     Studies/Results: Dg Femur 1v Right  08/05/2015  CLINICAL DATA:  Pain at distal end of right femur s/p amputation in November 2016. Pt's sister states pt has complained of the pain consistently since the surgery and that he has been prescribed antibiotics for a cyst that developed at the distal portion of the right femur x2 weeks ago. Hx of uncontrolled diabetes, tobacco, cocaine and alcohol abuse. EXAM: RIGHT FEMUR 1 VIEW COMPARISON:  None. FINDINGS: There has been amputation across the distal femur. At the amputated bone end, there is one margin which is less well-defined, and more irregular. This may reflect osteomyelitis. Heterotopic bone lies along the  margins of the shaft just above the amputation site. There are bubbles of soft tissue air that lie laterally, approximately 8.5 cm above the stump. The hip joint is normally spaced and aligned. IMPRESSION: 1. Possible osteomyelitis involving the amputated end of the distal right femur shaft. 2. Distal, lateral soft tissue air. This could reflect necrotizing fascitis. It could potentially be in a deep abscess. Electronically Signed   By: Lajean Manes M.D.   On:  08/05/2015 18:02      Assessment/Plan:  INTERVAL HISTORY:  08/05/15: obtundation persists though he is also receiving sedatives for agitation 08/06/15: no improvement in Neuro exam  Active Problems:   Altered mental status   Lung nodule seen on imaging study   Seizure (HCC)   Tachycardia   FUO (fever of unknown origin)   Meningoencephalitis   Polysubstance abuse   Hypoglycemia   Stump pain (HCC)    Steven [name] is a 55 y.o. male with poorly controlled diabetes mellitus, paroxysmal atrial fibrillation polysubstance abuse including cocaine and alcohol use who had been found unresponsive at home with a Glasgow Coma Scale, scale of 9 with extremely low blood sugars and possible seizure activitiy, he was also febrile and there was concern for possible SIRS and later for meningoencephaltiis  #1 Obtundation with concern for possible meningoencephalitis:  My impression is that he hasother reasons why he may be obtunded rather than meningoencephalitis namely his extremely low blood sugars his possible seizures and other metabolic derangements.  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  I have DC his ampicillin and his acyclovir and there has been no return of fever  His confusion persists and I wonder if he has sustained significant cortical brain damage but it is hard to make such  judgements acutely esp given that he is also receiving sedating medications to clam him when he is agitated  #2 Fevers: I have concern that his stump site may be infected again given tender he was in this region. He was NOT able to cooperate for MRI, plain film planned. Currently afebrile.  Dr. Hale Bogus covering this weekend for any questions I will be back on Monday to see the patient.      LOS: 3 days   Alcide Evener 08/06/2015, 1:33 PM

## 2015-08-06 NOTE — Care Management Note (Addendum)
Case Management Note  Patient Details  Name: Steven [name] MRN: [id number] Date of Birth: [birth year]  Subjective/Objective:    Patient was found unresponsive at home, he has an aide that is there with him for 3 hrs/day, Patient is still obtunded per MD note,NCM will cont to follow for dc needs.  Tried to contact patient's sister, Steven [name], left a message for her to return call.  Spoke with Pam she states that if patient does not get better ,he will not be able to take care of himself and will need SNF, it is ok for CSW to contact her, she will be having surgery on 3/1.  She states it is ok for CSW to fax patient out for SNF as well if this is the plan.  NCM informed her will let CSW know.               Action/Plan:   Expected Discharge Date:                  Expected Discharge Plan:  Skilled Nursing Facility  In-House Referral:  Clinical Social Work  Discharge planning Services  CM Consult  Post Acute Care Choice:    Choice offered to:     DME Arranged:    DME Agency:     HH Arranged:    [place] Agency:     Status of Service:  In process, will continue to follow  Medicare Important Message Given:    Date Medicare IM Given:    Medicare IM give by:    Date Additional Medicare IM Given:    Additional Medicare Important Message give by:     If discussed at [place] of Stay Meetings, dates discussed:    Additional Comments:  [name], RN 08/06/2015, 2:54 PM

## 2015-08-06 NOTE — Progress Notes (Signed)
New bag of IV heparin started at this time, not earlier this morning.

## 2015-08-06 NOTE — Progress Notes (Signed)
Initial Nutrition Assessment  DOCUMENTATION CODES:   Not applicable  INTERVENTION:    Diet advancement per MD as mental status improves. Add PO supplements as needed.  NUTRITION DIAGNOSIS:   Increased nutrient needs related to wound healing as evidenced by estimated needs.  GOAL:   Patient will meet greater than or equal to 90% of their needs  MONITOR:   PO intake, Supplement acceptance, Skin, Weight trends, Labs  REASON FOR ASSESSMENT:   Low Braden    ASSESSMENT:   55 y.o. male presenting with altered mental status. 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 Nutrition-Focused physical exam completed. Findings are mild-moderate muscle depletion. Currently on CIWA protocol. Remains NPO due to decreased mentation.  Diet Order:  Diet NPO time specified  Skin:  Wound (see comment) (Left Leg wound to end of AKA stump)  Last BM:  2/23  Height:   Ht Readings from Last 1 Encounters:  08/03/15 6\' 4"  (1.93 m)    Weight:   Wt Readings from Last 1 Encounters:  08/03/15 201 lb 15.1 oz (91.6 kg)    Ideal Body Weight:  78.5 kg  BMI:  Body mass index is 24.59 kg/(m^2).  Estimated Nutritional Needs:   Kcal:  2200-2400  Protein:  120-140 gm  Fluid:  2.2-2.4 L  EDUCATION NEEDS:   No education needs identified at this time  Molli Barrows, [place], [place], [place] Pager [phone] After Hours Pager [phone]

## 2015-08-06 NOTE — Progress Notes (Signed)
ANTICOAGULATION CONSULT NOTE - Follow Up Consult  Pharmacy Consult for heparin  Indication: new PE, hx afib (Xarelto on hold)  No Known Allergies  Patient Measurements: Height: 6\' 4"  (193 cm) Weight: 201 lb 15.1 oz (91.6 kg) IBW/kg (Calculated) : 86.8  Vital Signs: Temp: 97.9 F (36.6 C) (02/24 0816) Temp Source: Oral (02/24 0816) BP: 147/93 mmHg (02/24 0816) Pulse Rate: 76 (02/24 0816)  Labs:  Recent Labs  08/03/15 1349  08/03/15 2237 08/04/15 0228  08/04/15 0828 08/04/15 1337  08/05/15 0619 08/05/15 0855 08/05/15 1807 08/06/15 0619 08/06/15 0649  HGB 17.5*  < >  --  14.8  --   --   --   --   --  15.2  --  15.9  --   HCT 53.4*  < >  --  45.0  --   --   --   --   --  47.0  --  48.5  --   PLT 234  --   --  216  --   --   --   --   --  180  --  213  --   APTT  --   --   --   --   < > 35 24  < > 66*  --  69* 89*  --   LABPROT  --   --   --   --   --   --  13.9  --   --   --   --   --   --   INR  --   --   --   --   --   --  1.05  --   --   --   --   --   --   HEPARINUNFRC  --   --   --   --   --  0.60  --   --  0.52  --   --   --  0.63  CREATININE 0.80  < >  --  0.98  --   --   --   --   --  0.90  --  0.92  --   CKTOTAL 183  --   --   --   --   --   --   --   --   --   --   --   --   TROPONINI 0.04*  --  0.05* 0.07*  --  0.05*  --   --   --   --   --   --   --   < > = values in this interval not displayed.  Estimated Creatinine Clearance: 110.1 mL/min (by C-G formula based on Cr of 0.92).   Assessment: 55 yo male admitted 2/21 with sepsis, AMS, unresponsive.  Found to have PE.  Continues on heparin with therapeutic heparin level and aPTT.  Heparin level now correlates, will d/c aPTTs.  No bleeding noted.  Goal of Therapy:  Heparin level 0.3-0.7 units/ml Monitor platelets by anticoagulation protocol: Yes   Plan:  Continue heparin at 1650 units/hr Daily heparin level and CBC  Today is day#3 IV antibiotics.  Will order Vancomycin trough with AM labs.  [employer], Pharm.D., BCPS Clinical Pharmacist Pager [phone] 08/06/2015 10:56 AM

## 2015-08-06 NOTE — Consult Note (Signed)
ORTHOPAEDIC CONSULTATION  REQUESTING PHYSICIAN: Leeanne Rio, MD  Chief Complaint: Ulcer right AKA  HPI: Steven [name] is a 55 y.o. male who presents with altered mental status. He is status post left BKA and a right AKA. Patient does have an ulcer over the end of the residual limb.  Past Medical History  Diagnosis Date  . Uncontrolled diabetes mellitus ([place])   . HTN (hypertension)   . History of DVT (deep vein thrombosis)   . Personal history of diabetic foot ulcer   . Peripheral vascular disease ([place])     a. Abnl ABIs 2014.  Marland Kitchen Peripheral neuropathy ([place]) 11/19/2011  . Varicose vein     legs  . Osteomyelitis of foot, right, acute ([place]) 08/30/2012  . Personal history of colonic adenomas 05/29/2013  . History of cocaine use   . PAF (paroxysmal atrial fibrillation) ([place])     a. Dx 11/2013 in setting of sepsis/foot infection.  . Tobacco abuse   . H/O ETOH abuse   . Dysrhythmia     afib last admission  . Seasonal allergies   . Adjustment disorder with depressed mood    Past Surgical History  Procedure Laterality Date  . Amputation  04/21/2011    Procedure: AMPUTATION RAY;  Surgeon: Newt Minion, MD;  Location: [place];  Service: Orthopedics;  Laterality: Right;  Rt foot 2nd ray ampt  . I&d extremity  05/03/2011    Procedure: IRRIGATION AND DEBRIDEMENT EXTREMITY;  Surgeon: Newt Minion, MD;  Location: [place];  Service: Orthopedics;  Laterality: Right;  Irrigation and Debridement Right Foot and Place Acell Xenograft  . Toe amputation  04/24/2012    great toe   right foot  . Amputation  04/24/2012    Procedure: AMPUTATION RAY;  Surgeon: Newt Minion, MD;  Location: [place];  Service: Orthopedics;  Laterality: Right;  Right foot 1st ray amputation  . Amputation Left 04/23/2013    Procedure: AMPUTATION RAY;  Surgeon: Newt Minion, MD;  Location: [place];  Service: Orthopedics;  Laterality: Left;  Left Foot 5th Ray Amputation  . Colonoscopy    . Amputation Left 11/21/2013   Procedure: AMPUTATION RAY;  Surgeon: Newt Minion, MD;  Location: [place];  Service: Orthopedics;  Laterality: Left;  Left Foot 1st & 2nd Ray Amputation  . Amputation Right 12/04/2013    Procedure: AMPUTATION BELOW KNEE;  Surgeon: Marianna Payment, MD;  Location: [place];  Service: Orthopedics;  Laterality: Right;  . Amputation Left 12/29/2013    Procedure: LEFT AMPUTATION BELOW KNEE;  Surgeon: Marianna Payment, MD;  Location: [place];  Service: Orthopedics;  Laterality: Left;  . Amputation Right 04/15/2015    Procedure: AMPUTATION ABOVE KNEE, RIGHT;  Surgeon: Newt Minion, MD;  Location: [facility] [place];  Service: Orthopedics;  Laterality: Right;   Social History   Social History  . Marital Status: Single    Spouse Name: N/A  . Number of Children: N/A  . Years of Education: N/A   Social History Main Topics  . Smoking status: Current Every Day Smoker -- 0.50 packs/day for 30 years    Types: Cigarettes    Start date: 06/13/1983  . Smokeless tobacco: Never Used  . Alcohol Use: 3.0 - 3.6 oz/week    5-6 Standard drinks or equivalent per week     Comment: t states he hs rank any alcholol rcently.  drinks off and on  . Drug Use: No     Comment:  ast use of cocane an marjuana. denies  . Sexual Activity: Yes    Birth Control/ Protection: Injection     Comment: on patch   Other Topics Concern  . None   Social History Narrative   Family History  Problem Relation Age of Onset  . Diabetes Mother   . Stroke Brother   . Colon cancer Neg Hx    - negative except otherwise stated in the family history section No Known Allergies Prior to Admission medications   Medication Sig Start Date End Date Taking? Authorizing Provider  acetaminophen (TYLENOL) 325 MG tablet Take 2 tablets (650 mg total) by mouth every 6 (six) hours as needed. Patient taking differently: Take 650 mg by mouth every 6 (six) hours as needed for mild pain.  06/28/15  Yes Hillary Corinda Gubler, MD  cetirizine (ZYRTEC) 10 MG tablet  Take 1 tablet (10 mg total) by mouth daily. 05/04/15  Yes Leeanne Rio, MD  cholecalciferol (VITAMIN D) 1000 units tablet Take 1,000 Units by mouth daily.   Yes Historical Provider, MD  doxycycline (VIBRAMYCIN) 100 MG capsule Take 100 mg by mouth 2 (two) times daily.   Yes Historical Provider, MD  gabapentin (NEURONTIN) 400 MG capsule Take 2 capsules (800 mg total) by mouth 3 (three) times daily. 04/27/15  Yes Ivan Anchors Love, PA-C  insulin aspart (NOVOLOG) 100 UNIT/ML injection Inject 10 units daily with meals. 06/28/15  Yes Hillary Corinda Gubler, MD  insulin glargine (LANTUS) 100 UNIT/ML injection Inject 45 units nightly. 06/28/15  Yes Hillary Corinda Gubler, MD  iron polysaccharides (NIFEREX) 150 MG capsule Take 1 capsule (150 mg total) by mouth daily. 06/28/15  Yes Hillary Corinda Gubler, MD  lisinopril (PRINIVIL,ZESTRIL) 20 MG tablet take 1 tablet by mouth once daily for blood pressure 06/01/15  Yes Leeanne Rio, MD  methocarbamol (ROBAXIN) 500 MG tablet Take 1 tablet (500 mg total) by mouth every 6 (six) hours as needed for muscle spasms. 07/30/15  Yes Leeanne Rio, MD  Multiple Vitamin (MULTIVITAMIN WITH MINERALS) TABS tablet Take 1 tablet by mouth daily. 04/27/15  Yes Ivan Anchors Love, PA-C  rivaroxaban (XARELTO) 20 MG TABS tablet Take 1 tablet (20 mg total) by mouth daily with supper. For Atrial Fibrillation 06/28/15  Yes Hillary Corinda Gubler, MD  SSD 1 % cream Apply 1 application topically daily as needed. Rash/irritation 07/27/15  Yes Historical Provider, MD  traMADol (ULTRAM) 50 MG tablet Take 1 tablet (50 mg total) by mouth 4 (four) times daily -  before meals and at bedtime. 04/27/15  Yes Bary Leriche, PA-C  aspirin EC 81 MG tablet Take 2 tablets (162 mg total) by mouth daily. Patient not taking: Reported on 08/03/2015 04/27/15   Ivan Anchors Love, PA-C  atorvastatin (LIPITOR) 40 MG tablet Take 1 tablet (40 mg total) by mouth daily. Patient not taking: Reported on 04/14/2015  03/31/14   Leeanne Rio, MD  diltiazem (CARDIZEM CD) 120 MG 24 hr capsule Take 1 capsule (120 mg total) by mouth daily. Patient not taking: Reported on 08/03/2015 05/04/15   Leeanne Rio, MD  fluticasone [facility]) 50 MCG/ACT nasal spray Place 2 sprays into both nostrils daily. Patient not taking: Reported on 04/14/2015 09/09/14   Leeanne Rio, MD  folic acid (FOLVITE) 1 MG tablet Take 1 tablet (1 mg total) by mouth daily. Patient not taking: Reported on 04/14/2015 12/18/13   Ivan Anchors Love, PA-C  nicotine (NICODERM CQ - DOSED IN MG/24 HOURS) 21 mg/24hr patch Place  1 patch (21 mg total) onto the skin daily. Patient not taking: Reported on 08/03/2015 04/19/15   Smiley Houseman, MD  oxyCODONE 10 MG TABS Take 0.5-1 tablets (5-10 mg total) by mouth every 6 (six) hours as needed for severe pain. Patient not taking: Reported on 08/03/2015 04/27/15   Ivan Anchors Love, PA-C  pantoprazole (PROTONIX) 40 MG tablet Take 1 tablet (40 mg total) by mouth at bedtime. Patient not taking: Reported on 04/14/2015 09/25/14   Leeanne Rio, MD  polyethylene glycol [facility] Floria Raveling) packet Take 17 g by mouth daily as needed. For constipation Patient not taking: Reported on 08/03/2015 04/27/15   Ivan Anchors Love, PA-C  thiamine 100 MG tablet Take 1 tablet (100 mg total) by mouth daily. Patient not taking: Reported on 08/03/2015 04/27/15   Ivan Anchors Love, PA-C   Dg Femur 1v Right  08/05/2015  CLINICAL DATA:  Pain at distal end of right femur s/p amputation in November 2016. Pt's sister states pt has complained of the pain consistently since the surgery and that he has been prescribed antibiotics for a cyst that developed at the distal portion of the right femur x2 weeks ago. Hx of uncontrolled diabetes, tobacco, cocaine and alcohol abuse. EXAM: RIGHT FEMUR 1 VIEW COMPARISON:  None. FINDINGS: There has been amputation across the distal femur. At the amputated bone end, there is one margin which is less  well-defined, and more irregular. This may reflect osteomyelitis. Heterotopic bone lies along the margins of the shaft just above the amputation site. There are bubbles of soft tissue air that lie laterally, approximately 8.5 cm above the stump. The hip joint is normally spaced and aligned. IMPRESSION: 1. Possible osteomyelitis involving the amputated end of the distal right femur shaft. 2. Distal, lateral soft tissue air. This could reflect necrotizing fascitis. It could potentially be in a deep abscess. Electronically Signed   By: Lajean Manes M.D.   On: 08/05/2015 18:02   - pertinent xrays, CT, MRI studies were reviewed and independently interpreted  Positive ROS: All other systems have been reviewed and were otherwise negative with the exception of those mentioned in the HPI and as above.  Physical Exam: General: Altered mental status, not alert or oriented. Cardiovascular: Ischemic ulcer right AKA Respiratory: No cyanosis, no use of accessory musculature GI: No organomegaly, abdomen is soft and non-tender Skin: Necrotic ulcer 10 mm in diameter right AKA no purulent drainage. No crepitation the soft tissue. Neurologic: Patient does not have protective Sensation intact distally Psychiatric: Patient has altered mental status Lymphatic: No axillary or cervical lymphadenopathy  MUSCULOSKELETAL:  On examination patient has a stable left below the knee amputation right lower extremity he has a 10 mm ulcer with necrotic tissue right AKA. There is no cellulitis no purulence no odor. Radiographs show possible bony changes consistent with osteomyelitis.  Assessment: Assessment: Altered mental status with insensate neuropathic ulcer right above-the-knee amputation.    Plan: Plan: Would continue antibiotics, the air in the soft tissue is consistent with the ulcer, will have Santyl dressing changes started. I do not feel the patient's altered mental status is secondary to his ulcer right AKA. Would  not consider surgery unless patient was alert oriented and able to consent for surgery.  Thank you for the consult and the opportunity to see Mr. Steven [name], [place] [phone] 7:22 AM

## 2015-08-06 NOTE — Progress Notes (Signed)
FPTS Interim Progress Note  Spoke with neurology (Dr. Cheral Marker) regarding patient. Per reviewing patient's chart, neurology continues to believe that patient's mental status is most likely due to permanent brain damage due to prolonged episode of hypoglycemia. Infection from R AKA stump is also a possibility, however, per neuro, this is less likely. Neurology feels that if this is result of hypoglycemia, patient will begin to recover slowly within a few days, but will not return to pre-admission baseline.  New recommendations include continuing scheduled Ativan q6 hrs for one more day to complete 3 days total, followed by Ativan taper.  Neurology will not continue to follow at this time, but recommended re-consult if patient does not improve at all within the next few days.    Verner Mould, MD 08/06/2015, 8:10 PM PGY-1, [place] Medicine Service pager [phone]

## 2015-08-07 LAB — CBC
HEMATOCRIT: 46.1 % (ref 39.0–52.0)
HEMOGLOBIN: 15.4 g/dL (ref 13.0–17.0)
MCH: 28.3 pg (ref 26.0–34.0)
MCHC: 33.4 g/dL (ref 30.0–36.0)
MCV: 84.6 fL (ref 78.0–100.0)
Platelets: 219 10*3/uL (ref 150–400)
RBC: 5.45 MIL/uL (ref 4.22–5.81)
RDW: 15.4 % (ref 11.5–15.5)
WBC: 5.2 10*3/uL (ref 4.0–10.5)

## 2015-08-07 LAB — GLUCOSE, CAPILLARY
GLUCOSE-CAPILLARY: 203 mg/dL — AB (ref 65–99)
GLUCOSE-CAPILLARY: 189 mg/dL — AB (ref 65–99)
Glucose-Capillary: 169 mg/dL — ABNORMAL HIGH (ref 65–99)
Glucose-Capillary: 183 mg/dL — ABNORMAL HIGH (ref 65–99)
Glucose-Capillary: 193 mg/dL — ABNORMAL HIGH (ref 65–99)
Glucose-Capillary: 222 mg/dL — ABNORMAL HIGH (ref 65–99)

## 2015-08-07 LAB — VANCOMYCIN, TROUGH: Vancomycin Tr: 29 ug/mL (ref 10.0–20.0)

## 2015-08-07 LAB — HEPARIN LEVEL (UNFRACTIONATED): Heparin Unfractionated: 0.52 IU/mL (ref 0.30–0.70)

## 2015-08-07 MED ORDER — LORAZEPAM 2 MG/ML IJ SOLN
1.0000 mg | Freq: Four times a day (QID) | INTRAMUSCULAR | Status: DC
  Administered 2015-08-07 – 2015-08-09 (×5): 1 mg via INTRAVENOUS
  Filled 2015-08-07 (×5): qty 1

## 2015-08-07 MED ORDER — HYDRALAZINE HCL 20 MG/ML IJ SOLN
10.0000 mg | INTRAMUSCULAR | Status: DC | PRN
  Administered 2015-08-08: 10 mg via INTRAVENOUS
  Filled 2015-08-07: qty 1

## 2015-08-07 MED ORDER — VANCOMYCIN HCL IN DEXTROSE 1-5 GM/200ML-% IV SOLN
1000.0000 mg | Freq: Two times a day (BID) | INTRAVENOUS | Status: DC
  Administered 2015-08-08 – 2015-08-10 (×6): 1000 mg via INTRAVENOUS
  Filled 2015-08-07 (×9): qty 200

## 2015-08-07 MED ORDER — HYDRALAZINE HCL 20 MG/ML IJ SOLN
5.0000 mg | Freq: Four times a day (QID) | INTRAMUSCULAR | Status: DC
  Administered 2015-08-07 – 2015-08-12 (×18): 5 mg via INTRAVENOUS
  Filled 2015-08-07 (×18): qty 1

## 2015-08-07 NOTE — Progress Notes (Signed)
ANTICOAGULATION CONSULT NOTE - Follow Up Consult  Pharmacy Consult for heparin  Indication: new PE, hx afib (Xarelto on hold)  No Known Allergies  Patient Measurements: Height: 6\' 4"  (193 cm) Weight: 181 lb 3.5 oz (82.2 kg) IBW/kg (Calculated) : 86.8  Vital Signs: Temp: 97.9 F (36.6 C) (02/25 0800) Temp Source: Oral (02/25 0800) BP: 103/77 mmHg (02/25 0800) Pulse Rate: 55 (02/25 0800)  Labs:  Recent Labs  08/04/15 1337  08/05/15 [id number]  08/05/15 0855 08/05/15 1807 08/06/15 0619 08/06/15 0649 08/07/15 0708  HGB  --   --   --   < > 15.2  --  15.9  --  15.4  HCT  --   --   --   --  47.0  --  48.5  --  46.1  PLT  --   --   --   --  180  --  213  --  219  APTT 24  < > 66*  --   --  69* 89*  --   --   LABPROT 13.9  --   --   --   --   --   --   --   --   INR 1.05  --   --   --   --   --   --   --   --   HEPARINUNFRC  --   --  0.52  --   --   --   --  0.63 0.52  CREATININE  --   --   --   --  0.90  --  0.92  --   --   < > = values in this interval not displayed.  Estimated Creatinine Clearance: 104.2 mL/min (by C-G formula based on Cr of 0.92).   Assessment: 55 yo male admitted 2/21 with sepsis, AMS, unresponsive.  Found to have PE.  Continues on heparin with therapeutic heparin level.  No bleeding noted.  Goal of Therapy:  Heparin level 0.3-0.7 units/ml Monitor platelets by anticoagulation protocol: Yes   Plan:  Continue heparin at 1650 units/hr Daily heparin level and CBC  Duru Reiger, Pharm.D., BCPS Clinical Pharmacist Pager [phone] 08/07/2015 9:03 AM

## 2015-08-07 NOTE — Progress Notes (Signed)
Family Medicine Teaching Service Daily Progress Note Intern Pager: [phone]  Patient name: Steven [name] Medical record number: [id number] Date of birth: 11/27/1960 Age: 55 y.o. Gender: male  Primary Care Provider: Chrisandra Netters, MD Consultants: Neurology, ID, ortho Code Status: FULL  Pt Overview and Major Events to Date:  2/21 - admitted to [facility]; one dose of zosyn; started CTX 2/22 - started vanc, amp, acyclovir; acyclovir and amp d/ced; heparin gtt started 2/25 - ativan taper started   Assessment and Plan: Steven [name] is a 55 y.o. male presenting with altered mental status. 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 Also receiving scheduled ativan for persistently elevated CIWA scores. UDS + cocaine. Imaging negative. - Urine Cx (2/21) negative. Blood Cx (2/21) NGTD x 3 days. - NPO pending improved mental status - Neurology signed off, will call with questions - Continue thiamine, folate - Taper ativan  - ID consulted, will see again 2/27 - Continue vancomycin and rocephin (2/21 >> ) to cover possible stump infection  Multiple pulmonary emboli: CTA 2/22: nonobstructive emboli in LLL pulm a. branches. Hemodynamically stable without hypoxemia. Recurrent clot in setting of nonadherence to xarelto regimen.  - Continue heparin gtt - Monitor pulse ox  Hypoglycemia: Initial CBG 30. IDDM, last A1C 11.2 on 04/14/15.  - Hold diabetic medications (Lantus 45u qPM, novolog 10u qAC)  - Monitor CBG q4h: Consider scheduled sliding scale if hyperglycemia noted - Continue D5 1/2NS while NPO  HTN: Home meds: Lisinopril 20mg . BPs uncontrolled at 144-188 / 77-118 overnight, received hydralazine 10mg  prn x1. Worsened by withdrawal state. - Holdinging oral antihypertensives -  Adding hydralazine scheduled q6h to prn dose   Atrial Fibrillation:Outpatient medications: diltiazem 120mg , Xarelto 20mg , ASA 81mg . - Contacted cardiology concerning Diltiazem. Will hold at this time. Recommend Metoprolol IV if needed for HR control, however use with caution given cocaine positive. - Anticoagulation with heparin gtt - Restart oral home medications when able  Alcohol abuse/illicit drug use and tobacco use: UDS + cocaine, negative serum EtOH. CIWA protocol with scheduled ativan 2mg  q6h has controlled symptoms of cocaine intoxication/EtOH withdrawal.  - Continue CIWA (scores 16, 8, 8, 10 over last 12 hours) - Taper ativan from 2mg  to 1mg  q6h this PM  Stump ulcer: s/p R AKA (performed in 11/16 by Dr. Sharol Given). Patient with history of dry gangrene at that location within past few months. Concern for infection. Unable to obtain MRI of R femur yesterday as patient could not remain still. Did obtain plain film of R femur, which showed possible osteo of distal R femur vs necrotizing fasciitis. - Dr. Sharol Given recs: doubt this is cause of AMS. Continue Santyl dressings, and current abx sufficient to cover possible infection - Unable to obtain CT, so will  - Will need to permanently D/C tramadol  FEN/GI: NPO, D5 1/2NS@100cc /hr Prophylaxis: heparin gtt for PE  Disposition: Requires SDU level of observation.   Subjective:  First encounter with patient, though seems stable in comparison to prior exams. Would not respond in any meaningful way to loud voice or touch, though he's not obtunded.   Objective: Temp:  [95.2 F (35.1 C)-98.3 F (36.8 C)] 97.9 F (36.6 C) (02/25 0800) Pulse Rate:  [55-107] 55 (02/25 0800) Resp:  [16-39] 17 (02/25 0700) BP: (103-188)/(77-118) 103/77 mmHg (02/25 0800) SpO2:  [85 %-98 %] 85 % (  02/25 0600) Weight:  [181 lb 3.5 oz (82.2 kg)] 181 lb 3.5 oz (82.2 kg) (02/25 0250) Physical Exam: General: lying in bed in no distress, mildly restless Cardiovascular: RRR,  no murmurs appreciated, good cap refill Respiratory: normal effort, clear breath sounds anterior and laterally Abdomen: soft, non-distended, +BS Extremities: R AKA stump dressed c/d/i without surrounding erythema; L BKA cool and dry Neuro: grunts when touched on extremities or stethoscope on chest. MAEW, no tremor noted.   Laboratory:  Recent Labs Lab 08/05/15 0855 08/06/15 0619 08/07/15 0708  WBC 4.8 5.2 5.2  HGB 15.2 15.9 15.4  HCT 47.0 48.5 46.1  PLT 180 213 219    Recent Labs Lab 08/04/15 0228 08/05/15 0855 08/06/15 0619  NA 138 139 139  K 3.5 3.6 3.5  CL 104 105 110  CO2 22 25 22   BUN 9 <5* <5*  CREATININE 0.98 0.90 0.92  CALCIUM 9.0 8.8* 8.8*  PROT 5.7* 5.8* 6.2*  BILITOT 0.2* 0.3 0.3  ALKPHOS 60 60 62  ALT 13* 17 15*  AST 18 27 20   GLUCOSE 115* 179* 202*    Imaging/Diagnostic Tests: Ct Head Wo Contrast 08/03/2015 IMPRESSION: No acute intracranial pathology.   Ct Angio Chest Pe W/cm &/or Wo Cm 08/04/2015  ADDENDUM REPORT: 08/04/2015 07:44 ADDENDUM: Critical Value/emergent results were called by telephone at the time of interpretation on 08/04/2015 at 7:35 AM to Dr. Junie Panning , who verbally acknowledged these results.  08/04/2015 IMPRESSION: Small pulmonary emboli in branches of the left lower lobe pulmonary artery. These small pulmonary emboli are nonobstructing. No more central pulmonary embolus. No demonstrable right heart strain. Prominence of the ascending thoracic aorta with a measured diameter 4.2 x 4.1 cm. Recommend annual imaging followup by CTA or MRA. This recommendation follows 2010 ACCF/AHA/AATS/ACR/ASA/SCA/SCAI/SIR/STS/SVM Guidelines for the Diagnosis and Management of Patients with Thoracic Aortic Disease. Circulation. 2010; 121: e266-e369 8 mm nodular opacity left upper lobe. Followup of this nodular opacity should be based on Fleischner Society guidelines. If the patient is at high risk for bronchogenic carcinoma, follow-up chest CT at 3-62months is  recommended. If the patient is at low risk for bronchogenic carcinoma, follow-up chest CT at 6-12 months is recommended. This recommendation follows the consensus statement: Guidelines for Management of Small Pulmonary Nodules Detected on CT Scans: A Statement from the [employer] as published in Radiology 2005; 237:395-400. No frank edema or consolidation. Patchy atelectasis bilaterally. No demonstrable adenopathy.    Mr Jeri Cos Wo Contrast 08/03/2015 IMPRESSION: 1. Motion degraded study. 2. No acute intracranial process identified. 3. 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 4. Mild chronic small vessel ischemic disease.   Dg Chest Port 1 View 08/03/2015 IMPRESSION: No active disease.    Patrecia Pour, MD 08/07/2015, 10:02 AM PGY-3, [place] Intern pager: [phone], text pages welcome

## 2015-08-07 NOTE — Consult Note (Signed)
Pharmacy Antibiotic Note  Steven [name] is a 55 y.o. male admitted on 08/03/2015 with sepsis.  Pharmacy has been consulted for vanc dosing. A vancomycin trough today was elevated at 29.   Plan: - DC current  Vancomycin dose - Restart vancomycin tomorrow at 1gm IV Q12H - F/u renal cxn, C&S, clinical status and trough at SS   Height: 6\' 4"  (193 cm) Weight: 181 lb 3.5 oz (82.2 kg) IBW/kg (Calculated) : 86.8  Temp (24hrs), Avg:98 F (36.7 C), Min:97.6 F (36.4 C), Max:98.3 F (36.8 C)   Recent Labs Lab 08/03/15 1349 08/03/15 1432 08/03/15 1657 08/03/15 2245 08/04/15 0228 08/05/15 0855 08/06/15 0619 08/07/15 0708 08/07/15 1531  WBC 16.8*  --   --   --  11.0* 4.8 5.2 5.2  --   CREATININE 0.80  --  0.60*  --  0.98 0.90 0.92  --   --   LATICACIDVEN  --  3.21* 1.98 0.96  --   --   --   --   --   VANCOTROUGH  --   --   --   --   --   --   --   --  29*    Estimated Creatinine Clearance: 104.2 mL/min (by C-G formula based on Cr of 0.92).    No Known Allergies  Antimicrobials this admission: 2/22 Acyclovir >> 2/23 2/22 Ampicillin >> 2/23 2/22 Rocephin >> 2/21 Vanco>> 2/21 Zosyn >>2/21   Dose adjustments this admission: 2/25 VT = 29, changed to 1gm Q12H  Microbiology results: 2/21 blood x2- ngtd 2/21 urine- neg 2/22 MRSA PCR - neg  Thank you for allowing pharmacy to be a part of this patient's care.  Tashonda Pinkus, Rande Lawman 08/07/2015 4:57 PM

## 2015-08-07 NOTE — Progress Notes (Signed)
Patient ID: Steven [name], male   DOB: March 11, 1961, 55 y.o.   MRN: [id number] Patient's right residual limb ulcer is improving. Patient has good beefy granulation tissue there is no cellulitis no crepitation no drainage no signs of infection. This appears to be healing nicely.

## 2015-08-07 NOTE — Progress Notes (Signed)
Subjective: Clinically improving. More alert, but not responding to verbal commands.   Objective: Current vital signs: BP 103/77 mmHg  Pulse 55  Temp(Src) 97.9 F (36.6 C) (Oral)  Resp 17  Ht 6\' 4"  (1.93 m)  Wt 82.2 kg (181 lb 3.5 oz)  BMI 22.07 kg/m2  SpO2 85% Vital signs in last 24 hours: Temp:  [95.2 F (35.1 C)-98.3 F (36.8 C)] 97.9 F (36.6 C) (02/25 0800) Pulse Rate:  [55-107] 55 (02/25 0800) Resp:  [16-39] 17 (02/25 0700) BP: (103-188)/(77-118) 103/77 mmHg (02/25 0800) SpO2:  [85 %-98 %] 85 % (02/25 0600) Weight:  [82.2 kg (181 lb 3.5 oz)] 82.2 kg (181 lb 3.5 oz) (02/25 0250)  Intake/Output from previous day: 02/24 0701 - 02/25 0700 In: 1848.5 [I.V.:948.5; IV Piggyback:900] Out: [id number] [Urine:4150; Stool:3] Intake/Output this shift:   Nutritional status: Diet NPO time specified  Neurologic Exam: Ment: Awakens to tactile stimuli. Does not answer questions or respond to commands. Briefly fixates visually on examiner. Significantly improved relative to my prior examination.  CN: No asymmetry noted.  Motor/Sensory. Moves upper extremities equally to noxious. Lifts both lower extremities off bed. No seizure-like activity or adventitious movements noted.   Lab Results: Basic Metabolic Panel:  Recent Labs Lab 08/03/15 1349 08/03/15 1657 08/03/15 2237 08/04/15 0228 08/04/15 0828 08/05/15 0855 08/06/15 0619  NA 136 140  --  138  --  139 139  K 3.9 3.5  --  3.5  --  3.6 3.5  CL 101 101  --  104  --  105 110  CO2 23  --   --  22  --  25 22  GLUCOSE 120* 111*  --  115*  --  179* 202*  BUN 13 14  --  9  --  <5* <5*  CREATININE 0.80 0.60*  --  0.98  --  0.90 0.92  CALCIUM 10.1  --   --  9.0  --  8.8* 8.8*  MG  --   --  1.3* 1.4* 1.9  --   --   PHOS  --   --  2.5  --   --   --   --     Liver Function Tests:  Recent Labs Lab 08/03/15 1349 08/04/15 0228 08/05/15 0855 08/06/15 0619  AST 27 18 27 20   ALT 17 13* 17 15*  ALKPHOS 79 60 60 62  BILITOT 0.3 0.2*  0.3 0.3  PROT 8.3* 5.7* 5.8* 6.2*  ALBUMIN 3.0* 2.1* 2.2* 2.2*   No results for input(s): LIPASE, AMYLASE in the last 168 hours.  Recent Labs Lab 08/03/15 1345  AMMONIA 30    CBC:  Recent Labs Lab 08/03/15 1349 08/03/15 1657 08/04/15 0228 08/05/15 0855 08/06/15 0619 08/07/15 0708  WBC 16.8*  --  11.0* 4.8 5.2 5.2  NEUTROABS  --   --   --  1.8  --   --   HGB 17.5* 18.4* 14.8 15.2 15.9 15.4  HCT 53.4* 54.0* 45.0 47.0 48.5 46.1  MCV 86.3  --  84.7 86.1 87.2 84.6  PLT 234  --  216 180 213 219    Cardiac Enzymes:  Recent Labs Lab 08/03/15 1349 08/03/15 2237 08/04/15 0228 08/04/15 0828  CKTOTAL 183  --   --   --   TROPONINI 0.04* 0.05* 0.07* 0.05*    Lipid Panel: No results for input(s): CHOL, TRIG, HDL, CHOLHDL, VLDL, LDLCALC in the last 168 hours.  CBG:  Recent Labs Lab 08/06/15 1610 08/06/15 2023  08/07/15 0013 08/07/15 0427 08/07/15 0804  GLUCAP 184* 176* 60* 183* 203*    Microbiology: Results for orders placed or performed during the hospital encounter of 08/03/15  Urine culture     Status: None   Collection Time: 08/03/15 12:30 PM  Result Value Ref Range Status   Specimen Description URINE, RANDOM  Final   Special Requests Normal  Final   Culture NO GROWTH 2 DAYS  Final   Report Status 08/05/2015 FINAL  Final  Blood Culture (routine x 2)     Status: None (Preliminary result)   Collection Time: 08/03/15  2:30 PM  Result Value Ref Range Status   Specimen Description BLOOD LEFT ANTECUBITAL  Final   Special Requests BOTTLES DRAWN AEROBIC AND ANAEROBIC 5CC  Final   Culture NO GROWTH 3 DAYS  Final   Report Status PENDING  Incomplete  Blood Culture (routine x 2)     Status: None (Preliminary result)   Collection Time: 08/03/15  3:00 PM  Result Value Ref Range Status   Specimen Description BLOOD LEFT HAND  Final   Special Requests BOTTLES DRAWN AEROBIC AND ANAEROBIC 5CC  Final   Culture NO GROWTH 3 DAYS  Final   Report Status PENDING  Incomplete   MRSA PCR Screening     Status: None   Collection Time: 08/04/15 11:19 AM  Result Value Ref Range Status   MRSA by PCR NEGATIVE NEGATIVE Final    Comment:        The GeneXpert MRSA Assay (FDA approved for NASAL specimens only), is one component of a comprehensive MRSA colonization surveillance program. It is not intended to diagnose MRSA infection nor to guide or monitor treatment for MRSA infections.     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 Pt's sister states pt has complained of the pain consistently since the surgery and that he has been prescribed antibiotics for a cyst that developed at the distal portion of the right femur x2 weeks ago. Hx of uncontrolled diabetes, tobacco, cocaine and alcohol abuse. EXAM: RIGHT FEMUR 1 VIEW COMPARISON:  None. FINDINGS: There has been amputation across the distal femur. At the amputated bone end, there is one margin which is less well-defined, and more irregular. This may reflect osteomyelitis. Heterotopic bone lies along the margins of the shaft just above the amputation site. There are bubbles of soft tissue air that lie laterally, approximately 8.5 cm above the stump. The hip joint is normally spaced and aligned. IMPRESSION: 1. Possible osteomyelitis involving the amputated end of the distal right femur shaft. 2. Distal, lateral soft tissue air. This could reflect necrotizing fascitis. It could potentially be in a deep abscess. Electronically Signed   By: Lajean Manes M.D.   On: 08/05/2015 18:02    Medications: Medication list reviewed.   Assessment/Plan: 1. New onset seizure. Differential diagnosis is unchanged. Possible etiologies include severe recurrent hypoglycemia, lesional epilepsy, cryptogenic epilepsy, ethanol withdrawal, acute effect of cocaine abuse, possible Neurontin  withdrawal and Ultram use. EEG on 2/22 was abnormal with marked generalized continuous slowing of cerebral activity, which can be seen with metabolic and toxic encephalopathies, but is nonspecific. No evidence of epileptiform activity was recorded. 2. CT head revealed no acute intracranial abnormality. 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  3. Paroxysmal atrial fibnillation. He presented with a clinical picture and history suggestive of EtOH and sympathomimetic abuse. These behaviors present with an increased risk of ICH. If decision is made to anticoagulate as an outpatient, he most likely would need to be discharged to a supervised environment.   Recommendations: 1. Taper off lorazepam.  2. Continue thiamine.  3. Please call if there are additional questions.   Kerney Elbe, MD 08/07/2015, 9:35 AM

## 2015-08-08 ENCOUNTER — Inpatient Hospital Stay (HOSPITAL_COMMUNITY): Payer: Medicaid Other

## 2015-08-08 LAB — CULTURE, BLOOD (ROUTINE X 2)
Culture: NO GROWTH
Culture: NO GROWTH

## 2015-08-08 LAB — CBC
HEMATOCRIT: 43.1 % (ref 39.0–52.0)
Hemoglobin: 14.2 g/dL (ref 13.0–17.0)
MCH: 27.8 pg (ref 26.0–34.0)
MCHC: 32.9 g/dL (ref 30.0–36.0)
MCV: 84.3 fL (ref 78.0–100.0)
Platelets: 202 10*3/uL (ref 150–400)
RBC: 5.11 MIL/uL (ref 4.22–5.81)
RDW: 15.3 % (ref 11.5–15.5)
WBC: 5.1 10*3/uL (ref 4.0–10.5)

## 2015-08-08 LAB — HEPARIN LEVEL (UNFRACTIONATED)
HEPARIN UNFRACTIONATED: 0.16 [IU]/mL — AB (ref 0.30–0.70)
HEPARIN UNFRACTIONATED: 0.47 [IU]/mL (ref 0.30–0.70)
Heparin Unfractionated: <0.1 IU/mL — ABNORMAL LOW (ref 0.30–0.70)

## 2015-08-08 LAB — BASIC METABOLIC PANEL
ANION GAP: 9 (ref 5–15)
BUN: <5 mg/dL — ABNORMAL LOW (ref 6–20)
CHLORIDE: 110 mmol/L (ref 101–111)
CO2: 22 mmol/L (ref 22–32)
Calcium: 8.7 mg/dL — ABNORMAL LOW (ref 8.9–10.3)
Creatinine, Ser: 0.81 mg/dL (ref 0.61–1.24)
GFR calc non Af Amer: >60 mL/min (ref >60)
Glucose, Bld: 166 mg/dL — ABNORMAL HIGH (ref 65–99)
Potassium: 2.8 mmol/L — ABNORMAL LOW (ref 3.5–5.1)
Sodium: 141 mmol/L (ref 135–145)

## 2015-08-08 LAB — GLUCOSE, CAPILLARY
GLUCOSE-CAPILLARY: 158 mg/dL — AB (ref 65–99)
GLUCOSE-CAPILLARY: 164 mg/dL — AB (ref 65–99)
Glucose-Capillary: 151 mg/dL — ABNORMAL HIGH (ref 65–99)
Glucose-Capillary: 170 mg/dL — ABNORMAL HIGH (ref 65–99)
Glucose-Capillary: 192 mg/dL — ABNORMAL HIGH (ref 65–99)
Glucose-Capillary: 210 mg/dL — ABNORMAL HIGH (ref 65–99)

## 2015-08-08 MED ORDER — POTASSIUM CHLORIDE 10 MEQ/100ML IV SOLN
10.0000 meq | INTRAVENOUS | Status: AC
Start: 2015-08-08 — End: 2015-08-08
  Administered 2015-08-08 (×4): 10 meq via INTRAVENOUS

## 2015-08-08 MED ORDER — POTASSIUM CHLORIDE 10 MEQ/100ML IV SOLN
10.0000 meq | INTRAVENOUS | Status: DC
Start: 2015-08-08 — End: 2015-08-08
  Filled 2015-08-08: qty 100

## 2015-08-08 MED ORDER — POTASSIUM CHLORIDE 2 MEQ/ML IV SOLN
INTRAVENOUS | Status: DC
  Administered 2015-08-08 – 2015-08-12 (×6): via INTRAVENOUS
  Filled 2015-08-08 (×17): qty 1000

## 2015-08-08 MED ORDER — METOPROLOL TARTRATE 1 MG/ML IV SOLN: 2.5000 mg | INTRAVENOUS | Status: DC | PRN

## 2015-08-08 NOTE — Progress Notes (Signed)
ANTICOAGULATION CONSULT NOTE - Follow Up Consult  Pharmacy Consult for heparin  Indication: new PE, hx afib (Xarelto on hold)  No Known Allergies  Patient Measurements: Height: 6\' 4"  (193 cm) Weight: 181 lb 3.5 oz (82.2 kg) IBW/kg (Calculated) : 86.8  Vital Signs: Temp: 98.9 F (37.2 C) (02/26 1130) Temp Source: Oral (02/26 1130) BP: 177/109 mmHg (02/26 1130) Pulse Rate: 91 (02/26 1130)  Labs:  Recent Labs  08/05/15 1807  08/06/15 0619  08/07/15 0708 08/08/15 0443 08/08/15 1034  HGB  --   < > 15.9  --  15.4 14.2  --   HCT  --   --  48.5  --  46.1 43.1  --   PLT  --   --  213  --  219 202  --   APTT 69*  --  89*  --   --   --   --   HEPARINUNFRC  --   --   --   < > 0.52 <0.10* 0.16*  CREATININE  --   --  0.92  --   --  0.81  --   < > = values in this interval not displayed.  Estimated Creatinine Clearance: 118.4 mL/min (by C-G formula based on Cr of 0.81).   Assessment: 55 yo male admitted 2/21 with sepsis, AMS, unresponsive.  Found to have PE.    Pt had some IV issues overnight but per RN IV has been running fine since restart at 0400.  Heparin level is subtherapeutic on 1650 units/hr.  No bleeding noted.  Goal of Therapy:  Heparin level 0.3-0.7 units/ml Monitor platelets by anticoagulation protocol: Yes   Plan:  Increase heparin at 1850 units/hr Check heparin level in 6 hours. Daily heparin level and CBC  Antavia Tandy, Pharm.D., BCPS Clinical Pharmacist Pager [phone] 08/08/2015 12:23 PM

## 2015-08-08 NOTE — Progress Notes (Signed)
Steven [name] is a 55 y.o. male patient admitted from ED awake, alert - nonverbal - no acute distress noted.  VSS - Blood pressure 124/97, pulse 86, temperature 98.3 F (36.8 C), temperature source Oral, resp. rate 18, height 6\' 4"  (1.93 m), weight 82.2 kg (181 lb 3.5 oz), SpO2 97 %.    IV in place, occlusive dsg intact without redness.  Orientation to room, and floor completed with information packet given to patient.   Admission INP armband ID verified with patient, and in place.  SR up x 2, fall assessment complete. Call light within reach.  Skin, clean-dry- intact without evidence of bruising, or skin tears.  No evidence of skin break down noted on exam.     Will continue to evaluate and treat per MD orders.  Elon Jester, RN 08/08/2015 10:18 PM

## 2015-08-08 NOTE — Progress Notes (Signed)
ANTICOAGULATION CONSULT NOTE - Follow Up Consult  Pharmacy Consult for heparin  Indication: new PE, hx afib (Xarelto on hold)  No Known Allergies  Patient Measurements: Height: 6\' 4"  (193 cm) Weight: 181 lb 3.5 oz (82.2 kg) IBW/kg (Calculated) : 86.8  Vital Signs: Temp: 98.5 F (36.9 C) (02/26 1503) Temp Source: Oral (02/26 1503) BP: 156/83 mmHg (02/26 1820) Pulse Rate: 86 (02/26 1506)  Labs:  Recent Labs  08/06/15 0619  08/07/15 0708 08/08/15 0443 08/08/15 1034 08/08/15 1912  HGB 15.9  --  15.4 14.2  --   --   HCT 48.5  --  46.1 43.1  --   --   PLT 213  --  219 202  --   --   APTT 89*  --   --   --   --   --   HEPARINUNFRC  --   < > 0.52 <0.10* 0.16* 0.47  CREATININE 0.92  --   --  0.81  --   --   < > = values in this interval not displayed.  Estimated Creatinine Clearance: 118.4 mL/min (by C-G formula based on Cr of 0.81).   Assessment: 55 yo male admitted 2/21 with sepsis, AMS, unresponsive.  Found to have PE and initiated on IV heparin.  Heparin level is now therapeutic. No bleeding noted.   Goal of Therapy:  Heparin level 0.3-0.7 units/ml Monitor platelets by anticoagulation protocol: Yes   Plan:  - Continue heparin gtt 1850 units/hr - F/u AM heparin level to confirm doing - Daily heparin level and CBC  Salome Arnt, PharmD, BCPS Pager # [phone] 08/08/2015 7:58 PM

## 2015-08-08 NOTE — Progress Notes (Addendum)
Report received from RN on [facility]. Pt to be transferred to [facility] room 37.

## 2015-08-08 NOTE — Progress Notes (Signed)
Family Medicine Teaching Service Daily Progress Note Intern Pager: [phone]  Patient name: Steven [name] Medical record number: [id number] Date of birth: January 18, 1961 Age: 55 y.o. Gender: male  Primary Care Provider: Chrisandra Netters, MD Consultants: Neurology, ID, ortho Code Status: FULL  Pt Overview and Major Events to Date:  2/21 - admitted to [facility]; one dose of zosyn; started CTX 2/22 - started vanc, amp, acyclovir; acyclovir and amp d/ced; heparin gtt started 2/25 - ativan taper started   Assessment and Plan: Steven [name] is a 55 y.o. male presenting with altered mental status. 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 More alert this AM without significant restlessness with ativan taper. UDS + cocaine. Imaging negative. - Urine Cx (2/21) negative. Blood Cx (2/21) NGTD x 4 days. - NPO pending improved mental status - Neurology signed off, will call with questions - Continue thiamine, folate - Continue to monitor as ativan is tapered - Continue vancomycin and rocephin (2/21 >> ) to cover possible stump infection  Multiple pulmonary emboli: CTA 2/22: nonobstructive emboli in LLL pulm a. branches. Hemodynamically stable without hypoxemia. Recurrent clot in setting of nonadherence to xarelto regimen.  - Continue heparin gtt - Monitor pulse ox  Hypoglycemia: Resolved. IDDM, last A1C 11.2 on 04/14/15.  - Hold diabetic medications (Lantus 45u qPM, novolog 10u qAC)  - Monitor CBG q4h: Consider scheduled sliding scale if hyperglycemia noted - Continue D5 1/2NS while NPO  Hypokalemia: K 2.8 2/26 AM. Due to no PO. Renal function wnl. - Add KCl 78mEq/L to IVF, so 24 hr dose total will be 48 mEq.  - Give IV potassium x 4 runs - Recheck 2/27  HTN: Home meds: Lisinopril  20mg . BPs uncontrolled at 144-188 / 77-118 overnight, received hydralazine 10mg  prn x1. Worsened by withdrawal state. - Holdinging oral antihypertensives - Improved control with hydralazine scheduled q6h. Still has prn dose as well.   Atrial Fibrillation:Outpatient medications: diltiazem 120mg , Xarelto 20mg , ASA 81mg . - Holding diltiazem.  - Give Metoprolol IV prn HR > 110bpm. Doubt cocaine is still in his system to cause unopposed alpha antagonism.  - Anticoagulation with heparin gtt - Restart oral home medications when able.  Alcohol abuse/illicit drug use and tobacco use: UDS + cocaine, negative serum EtOH.  - Continue CIWA (scores 4 and 8 over last 12 hours) - Tapered ativan from 2mg  to 1mg  q6h overnight, will continue this regimen and monitor agitation. prn dose given x1 overnight. - Will need to permanently D/C tramadol  Stump ulcer: s/p R AKA (performed in 11/16 by Dr. Sharol Given). MRI unobtainable due to motion artifact. Plain film with deep soft tissue air. Area not clinically appearing infected.  - Dr. Sharol Given recs: doubt this is cause of AMS. Continue Santyl dressings, and current abx sufficient to cover possible infection.  - Consider D/C abx. ID to see 2/27.   FEN/GI: NPO, D5 1/2NS@100cc /hr Prophylaxis: heparin gtt for PE  Disposition: Requires SDU level of observation.   Subjective:  More alert this AM, opening eyes to voice or touch, still not following commands. No meaningful response to questions.  Objective: Temp:  [97.9 F (36.6 C)-98.6 F (37 C)] 98.4 F (36.9 C) (02/26 0330) Pulse Rate:  [55-111] 85 (02/26 0330) Resp:  [20-29] 22 (02/26 0330) BP: (103-173)/(77-131) 141/85 mmHg (02/26 0330) SpO2:  [93 %-98 %] 97 % (  02/26 0330) Physical Exam: General: lying in bed in no distress Cardiovascular: RRR, no murmurs appreciated, good cap refill Respiratory: normal effort, clear breath sounds anterior and laterally Abdomen: soft, non-distended, +BS Extremities: R AKA  stump dressed c/d/i without surrounding erythema; L BKA cool and dry Neuro: Opens eyes to touch/voice. No vocalization. MAEW, no tremor noted.   Laboratory:  Recent Labs Lab 08/06/15 0619 08/07/15 0708 08/08/15 0443  WBC 5.2 5.2 5.1  HGB 15.9 15.4 14.2  HCT 48.5 46.1 43.1  PLT 213 219 202    Recent Labs Lab 08/04/15 0228 08/05/15 0855 08/06/15 0619 08/08/15 0443  NA 138 139 139 141  K 3.5 3.6 3.5 2.8*  CL 104 105 110 110  CO2 22 25 22 22   BUN 9 <5* <5* <5*  CREATININE 0.98 0.90 0.92 0.81  CALCIUM 9.0 8.8* 8.8* 8.7*  PROT 5.7* 5.8* 6.2*  --   BILITOT 0.2* 0.3 0.3  --   ALKPHOS 60 60 62  --   ALT 13* 17 15*  --   AST 18 27 20   --   GLUCOSE 115* 179* 202* 166*    Imaging/Diagnostic Tests: Ct Head Wo Contrast 08/03/2015 IMPRESSION: No acute intracranial pathology.   Ct Angio Chest Pe W/cm &/or Wo Cm 08/04/2015  ADDENDUM REPORT: 08/04/2015 07:44 ADDENDUM: Critical Value/emergent results were called by telephone at the time of interpretation on 08/04/2015 at 7:35 AM to Dr. Junie Panning , who verbally acknowledged these results.  08/04/2015 IMPRESSION: Small pulmonary emboli in branches of the left lower lobe pulmonary artery. These small pulmonary emboli are nonobstructing. No more central pulmonary embolus. No demonstrable right heart strain. Prominence of the ascending thoracic aorta with a measured diameter 4.2 x 4.1 cm. Recommend annual imaging followup by CTA or MRA. This recommendation follows 2010 ACCF/AHA/AATS/ACR/ASA/SCA/SCAI/SIR/STS/SVM Guidelines for the Diagnosis and Management of Patients with Thoracic Aortic Disease. Circulation. 2010; 121: [id number] 8 mm nodular opacity left upper lobe. Followup of this nodular opacity should be based on Fleischner Society guidelines. If the patient is at high risk for bronchogenic carcinoma, follow-up chest CT at 3-65months is recommended. If the patient is at low risk for bronchogenic carcinoma, follow-up chest CT at 6-12 months is  recommended. This recommendation follows the consensus statement: Guidelines for Management of Small Pulmonary Nodules Detected on CT Scans: A Statement from the [employer] as published in Radiology 2005; 237:395-400. No frank edema or consolidation. Patchy atelectasis bilaterally. No demonstrable adenopathy.    Mr Jeri Cos Wo Contrast 08/03/2015 IMPRESSION: 1. Motion degraded study. 2. No acute intracranial process identified. 3. 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 4. Mild chronic small vessel ischemic disease.   Dg Chest Port 1 View 08/03/2015 IMPRESSION: No active disease.    Patrecia Pour, MD 08/08/2015, 7:29 AM PGY-3, [place] Intern pager: [phone], text pages welcome

## 2015-08-09 ENCOUNTER — Ambulatory Visit: Payer: Medicaid Other | Admitting: Family Medicine

## 2015-08-09 LAB — GLUCOSE, CAPILLARY
GLUCOSE-CAPILLARY: 152 mg/dL — AB (ref 65–99)
GLUCOSE-CAPILLARY: 153 mg/dL — AB (ref 65–99)
GLUCOSE-CAPILLARY: 161 mg/dL — AB (ref 65–99)
Glucose-Capillary: 133 mg/dL — ABNORMAL HIGH (ref 65–99)
Glucose-Capillary: 142 mg/dL — ABNORMAL HIGH (ref 65–99)

## 2015-08-09 LAB — CBC
HCT: 43.7 % (ref 39.0–52.0)
Hemoglobin: 14.3 g/dL (ref 13.0–17.0)
MCH: 27.7 pg (ref 26.0–34.0)
MCHC: 32.7 g/dL (ref 30.0–36.0)
MCV: 84.5 fL (ref 78.0–100.0)
Platelets: 213 K/uL (ref 150–400)
RBC: 5.17 MIL/uL (ref 4.22–5.81)
RDW: 15.5 % (ref 11.5–15.5)
WBC: 4.9 K/uL (ref 4.0–10.5)

## 2015-08-09 LAB — BASIC METABOLIC PANEL
Anion gap: 11 (ref 5–15)
CALCIUM: 8.9 mg/dL (ref 8.9–10.3)
CO2: 19 mmol/L — ABNORMAL LOW (ref 22–32)
CREATININE: 0.85 mg/dL (ref 0.61–1.24)
Chloride: 109 mmol/L (ref 101–111)
GFR calc Af Amer: >60 mL/min (ref >60)
Glucose, Bld: 176 mg/dL — ABNORMAL HIGH (ref 65–99)
Potassium: 3.2 mmol/L — ABNORMAL LOW (ref 3.5–5.1)
SODIUM: 139 mmol/L (ref 135–145)

## 2015-08-09 LAB — BASIC METABOLIC PANEL WITH GFR
Anion gap: 11 (ref 5–15)
BUN: <5 mg/dL — ABNORMAL LOW (ref 6–20)
CO2: 20 mmol/L — ABNORMAL LOW (ref 22–32)
Calcium: 8.9 mg/dL (ref 8.9–10.3)
Chloride: 109 mmol/L (ref 101–111)
Creatinine, Ser: 0.86 mg/dL (ref 0.61–1.24)
GFR calc Af Amer: >60 mL/min
GFR calc non Af Amer: >60 mL/min
Glucose, Bld: 177 mg/dL — ABNORMAL HIGH (ref 65–99)
Potassium: 3.2 mmol/L — ABNORMAL LOW (ref 3.5–5.1)
Sodium: 140 mmol/L (ref 135–145)

## 2015-08-09 MED ORDER — POTASSIUM CHLORIDE CRYS ER 20 MEQ PO TBCR
40.0000 meq | EXTENDED_RELEASE_TABLET | Freq: Two times a day (BID) | ORAL | Status: DC
  Filled 2015-08-09: qty 2

## 2015-08-09 MED ORDER — DEXTROSE 5 % IV SOLN
2.0000 g | INTRAVENOUS | Status: DC
  Administered 2015-08-10 – 2015-08-12 (×3): 2 g via INTRAVENOUS
  Filled 2015-08-09 (×3): qty 2

## 2015-08-09 MED ORDER — POTASSIUM CHLORIDE 10 MEQ/100ML IV SOLN
10.0000 meq | INTRAVENOUS | Status: AC
  Administered 2015-08-09 (×5): 10 meq via INTRAVENOUS
  Filled 2015-08-09 (×5): qty 100

## 2015-08-09 MED ORDER — ENOXAPARIN SODIUM 80 MG/0.8ML ~~LOC~~ SOLN
80.0000 mg | Freq: Two times a day (BID) | SUBCUTANEOUS | Status: DC
Start: 2015-08-09 — End: 2015-08-12
  Administered 2015-08-09 – 2015-08-12 (×6): 80 mg via SUBCUTANEOUS
  Filled 2015-08-09 (×6): qty 0.8

## 2015-08-09 NOTE — Progress Notes (Signed)
Subjective:  Confused but much more awake   Antibiotics:  Anti-infectives    Start     Dose/Rate Route Frequency Ordered Stop   08/10/15 1100  cefTRIAXone (ROCEPHIN) 2 g in dextrose 5 % 50 mL IVPB     2 g 100 mL/hr over 30 Minutes Intravenous Every 24 hours 08/09/15 1122     08/08/15 1000  vancomycin (VANCOCIN) IVPB 1000 mg/200 mL premix     1,000 mg 200 mL/hr over 60 Minutes Intravenous Every 12 hours 08/07/15 1656     08/04/15 1300  acyclovir (ZOVIRAX) 900 mg in dextrose 5 % 150 mL IVPB  Status:  Discontinued     900 mg 168 mL/hr over 60 Minutes Intravenous Every 8 hours 08/04/15 1135 08/04/15 1628   08/04/15 0000  ampicillin (OMNIPEN) 2 g in sodium chloride 0.9 % 50 mL IVPB  Status:  Discontinued     2 g 150 mL/hr over 20 Minutes Intravenous 6 times per day 08/03/15 2311 08/04/15 1628   08/03/15 2330  vancomycin (VANCOCIN) IVPB 1000 mg/200 mL premix  Status:  Discontinued     1,000 mg 200 mL/hr over 60 Minutes Intravenous Every 8 hours 08/03/15 2005 08/07/15 1650   08/03/15 2330  piperacillin-tazobactam (ZOSYN) IVPB 3.375 g  Status:  Discontinued     3.375 g 12.5 mL/hr over 240 Minutes Intravenous Every 8 hours 08/03/15 2005 08/03/15 2122   08/03/15 2315  cefTRIAXone (ROCEPHIN) 2 g in dextrose 5 % 50 mL IVPB  Status:  Discontinued     2 g 100 mL/hr over 30 Minutes Intravenous Every 12 hours 08/03/15 2311 08/09/15 1122   08/03/15 1500  vancomycin (VANCOCIN) 2,000 mg in sodium chloride 0.9 % 500 mL IVPB     2,000 mg 250 mL/hr over 120 Minutes Intravenous  Once 08/03/15 1456 08/03/15 1859   08/03/15 1500  piperacillin-tazobactam (ZOSYN) IVPB 3.375 g     3.375 g 100 mL/hr over 30 Minutes Intravenous  Once 08/03/15 1456 08/03/15 1626      Medications: Scheduled Meds: . antiseptic oral rinse  7 mL Mouth Rinse q12n4p  . [START ON 08/10/2015] cefTRIAXone (ROCEPHIN)  IV  2 g Intravenous Q24H  . chlorhexidine  15 mL Mouth Rinse BID  . collagenase   Topical Daily  .  enoxaparin (LOVENOX) injection  80 mg Subcutaneous Q12H  . folic acid  1 mg Intravenous Daily  . hydrALAZINE  5 mg Intravenous Q6H  . potassium chloride  10 mEq Intravenous Q1 Hr x 6  . sodium chloride flush  3 mL Intravenous Q12H  . thiamine  100 mg Intravenous Daily  . vancomycin  1,000 mg Intravenous Q12H   Continuous Infusions: . dextrose 5 % and 0.45% NaCl 1,000 mL with potassium chloride 20 mEq infusion 100 mL/hr at 08/09/15 1211   PRN Meds:.acetaminophen, hydrALAZINE, LORazepam, metoprolol    Objective: Weight change:   Intake/Output Summary (Last 24 hours) at 08/09/15 2024 Last data filed at 08/09/15 1847  Gross per 24 hour  Intake 1316.5 ml  Output   1500 ml  Net -183.5 ml   Blood pressure 161/86, pulse 85, temperature 98.8 F (37.1 C), temperature source Oral, resp. rate 20, height 6\' 4"  (1.93 m), weight 181 lb 3.5 oz (82.2 kg), SpO2 98 %. Temp:  [97.7 F (36.5 C)-98.8 F (37.1 C)] 98.8 F (37.1 C) (02/27 1344) Pulse Rate:  [79-93] 85 (02/27 1344) Resp:  [18-20] 20 (02/27 1344) BP: (124-175)/(45-97) 161/86 mmHg (02/27 1344)  SpO2:  [97 %-99 %] 98 % (02/27 0403)  Physical Exam: General: confused but much more awake and tracks better HEENT: anicteric sclera Cardiovascular:Irr irr rhythm, no murmur rubs or gallops Pulmonary: clear to auscultation bilaterally, no wheezing, rales or rhonchi Gastrointestinal: soft nontender, nondistended, normal bowel sounds, Musculoskeletal/skin: stumps right with dressing Neuro: nonfocal  CBC:  CBC Latest Ref Rng 08/09/2015 08/08/2015 08/07/2015  WBC 4.0 - 10.5 K/uL 4.9 5.1 5.2  Hemoglobin 13.0 - 17.0 g/dL 14.3 14.2 15.4  Hematocrit 39.0 - 52.0 % 43.7 43.1 46.1  Platelets 150 - 400 K/uL 213 202 219       BMET  Recent Labs  08/09/15 1310 08/09/15 1315  NA 140 139  K 3.2* 3.2*  CL 109 109  CO2 20* 19*  GLUCOSE 177* 176*  BUN <5* <5*  CREATININE 0.86 0.85  CALCIUM 8.9 8.9     Liver Panel  No results for  input(s): PROT, ALBUMIN, AST, ALT, ALKPHOS, BILITOT, BILIDIR, IBILI in the last 72 hours.     Sedimentation Rate No results for input(s): ESRSEDRATE in the last 72 hours. C-Reactive Protein No results for input(s): CRP in the last 72 hours.  Micro Results: Recent Results (from the past 720 hour(s))  Urine culture     Status: None   Collection Time: 08/03/15 12:30 PM  Result Value Ref Range Status   Specimen Description URINE, RANDOM  Final   Special Requests Normal  Final   Culture NO GROWTH 2 DAYS  Final   Report Status 08/05/2015 FINAL  Final  Blood Culture (routine x 2)     Status: None   Collection Time: 08/03/15  2:30 PM  Result Value Ref Range Status   Specimen Description BLOOD LEFT ANTECUBITAL  Final   Special Requests BOTTLES DRAWN AEROBIC AND ANAEROBIC 5CC  Final   Culture NO GROWTH 5 DAYS  Final   Report Status 08/08/2015 FINAL  Final  Blood Culture (routine x 2)     Status: None   Collection Time: 08/03/15  3:00 PM  Result Value Ref Range Status   Specimen Description BLOOD LEFT HAND  Final   Special Requests BOTTLES DRAWN AEROBIC AND ANAEROBIC 5CC  Final   Culture NO GROWTH 5 DAYS  Final   Report Status 08/08/2015 FINAL  Final  MRSA PCR Screening     Status: None   Collection Time: 08/04/15 11:19 AM  Result Value Ref Range Status   MRSA by PCR NEGATIVE NEGATIVE Final    Comment:        The GeneXpert MRSA Assay (FDA approved for NASAL specimens only), is one component of a comprehensive MRSA colonization surveillance program. It is not intended to diagnose MRSA infection nor to guide or monitor treatment for MRSA infections.     Studies/Results: Dg Chest Port 1 View  08/08/2015  CLINICAL DATA:  Cough, sepsis, altered mental status, unresponsive EXAM: PORTABLE CHEST 1 VIEW COMPARISON:  08/03/2015 FINDINGS: Mild cardiomegaly with vascular congestion and interstitial prominence, likely interstitial edema. No effusions. No acute bony abnormality.  IMPRESSION: Mild cardiomegaly, vascular congestion and early interstitial edema. Electronically Signed   By: Rolm Baptise M.D.   On: 08/08/2015 14:37      Assessment/Plan:  INTERVAL HISTORY:  08/05/15: obtundation persists though he is also receiving sedatives for agitation 08/06/15: no improvement in Neuro exam 2/25--08/08/25  Active Problems:   Altered mental status   Lung nodule seen on imaging study   Seizure (HCC)   Tachycardia   FUO (fever  of unknown origin)   Meningoencephalitis   Polysubstance abuse   Hypoglycemia   Stump pain (HCC)    Steven [name] is a 55 y.o. male with poorly controlled diabetes mellitus, paroxysmal atrial fibrillation polysubstance abuse including cocaine and alcohol use who had been found unresponsive at home with a Glasgow Coma Scale, scale of 9 with extremely low blood sugars and possible seizure activitiy, he was also febrile and there was concern for possible SIRS and later for meningoencephaltiis  #1 Obtundation with concern for possible meningoencephalitis:  I am still highly skeptical of meningoencephalitis  I think his confusion is more likely due to his hypoglcycemia, prolonged time down underperfusion of CNS  On the VERY UNLIKELY chance that this IS a meningoencephalitis I would complete a TOTAL OF TWO WEEK COURSE of his current antibiotics of vancomycin and ceftriaxone--which would also cover his stump site   #2 ? Stump inffection:  Dr. Sharol Given is skeptical for deep infection. Not possible to image his stump at present.  Would re-assess WHEN and IF he has significant neurological improvement to allow for this. Letting him come off abx after 2 week course outlined above may also give [place] further clarity.  I will otherwise sign off for now please call back with further questions.      LOS: 6 days   Alcide Evener 08/09/2015, 8:24 PM

## 2015-08-09 NOTE — Progress Notes (Signed)
Family Medicine Teaching Service Daily Progress Note Intern Pager: [phone]  Patient name: Steven [name] Medical record number: [id number] Date of birth: 11-May-1961 Age: 55 y.o. Gender: male  Primary Care Provider: Chrisandra Netters, MD Consultants: neurology, ID orthopedics Code Status: FULL  Pt Overview and Major Events to Date:  2/21 - admitted to [facility]; one dose of zosyn; started CTX 2/22 - started vanc, amp, acyclovir; acyclovir and amp d/ced; heparin gtt started 2/25 - Ativan taper started   Assessment and Plan: Steven [name] is a 55 y.o. male presenting with altered mental status. PMH is significant for DM, HTN, atrial fibrillation, polysubstance abuse (cocaine, tobacco, alcohol), h/o DVT, PVD, R AKA, L BKA.   Acute encephalopathy: Primary insults include neuroglycopenia, seizure, cocaine effects, and possible stump infection. More alert this AM without significant restlessness with ativan taper. UDS + cocaine. Imaging negative. Urine and blood cultures with no growth (final).  - NPO pending improved mental status - Neurology signed off, will call with questions - Continue thiamine, folate - Continue vancomycin and rocephin (2/21 >> ) to cover possible stump infection - Palliative care consulted - will meet with patient's sister regarding goals of care tomorrow afternoon (2/28) - SLP consulted to determine patient's ability to swallow and thus transition to PO meds  Multiple pulmonary emboli: CTA 2/22: nonobstructive emboli in LLL pulm a. branches. Hemodynamically stable without hypoxemia. Recurrent clot in setting of nonadherence to xarelto regimen.  - Continue heparin gtt - Monitor pulse ox  Hypoglycemia: Resolved. IDDM, last A1C 11.2 on 04/14/15. CBG 133, 152 overnight.  - Hold diabetic medications (Lantus 45U qHS, Vovolog 10U qAC)  - Monitor CBG q4h: Consider scheduled sliding scale if hyperglycemia noted - Continue D5 1/2NS while NPO  Hypokalemia: K 2.8 2/26 AM. Due  to no PO intake. Renal function WNL. - F/u AM BMP  HTN: Home meds: Lisinopril 20mg . Remains HTN at 124-175/45-97 overnight. Worsened by withdrawal state. - Holdinging oral antihypertensives - Improved control with hydralazine scheduled q6h. Still has prn dose as well.   Atrial Fibrillation:Outpatient medications: diltiazem 120mg , Xarelto 20mg , ASA 81mg . Required Metoprolol IV prn HR > 110bpm yesterday. Doubt cocaine is still in his system to cause unopposed alpha antagonism. HR WNL overnight. - Holding diltiazem - Anticoagulation with heparin gtt  - Restart oral home medications when able  Alcohol abuse/illicit drug use and tobacco use: UDS pos for cocaine, neg serum EtOH.  - Continue CIWA (scores 6, 6, 4 overnight) - D/c scheduled Ativan in hopes of improving mental status - Continue to monitor for agitation. May give PRN Ativan if necessary - Tramadol discontinued  Stump ulcer: s/p R AKA (performed in 11/16 by Dr. Sharol Given). MRI unobtainable due to motion artifact. Plain film with deep soft tissue air. Area not clinically appearing infected. Per ortho (Dr. Sharol Given), stump infection is unlikely cause of AMS.  - Per ortho, continue Santyl dressings. Current abx sufficient to cover possible infection.  - ID consulted - appreciate recs   FEN/GI: NPO, D5 1/2NS + 17mEq KCl@100cc /hr Prophylaxis: heparin gtt for PE  Disposition: monitor for clinical improvement  Subjective:  Patient opened eyes to name once, but would not open his eyes again. No acute events overnight.   Objective: Temp:  [97.7 F (36.5 C)-98.5 F (36.9 C)] 97.7 F (36.5 C) (02/27 0011) Pulse Rate:  [79-93] 79 (02/27 0403) Resp:  [12-20] 18 (02/27 0403) BP: (111-175)/(45-97) 141/45 mmHg (02/27 0403) SpO2:  [90 %-99 %] 98 % (02/27 0403) Physical Exam: General: lying  in bed resting comfortably, in NAD Cardiovascular: RRR, no murmurs appreciated Respiratory: CTAB, normal work of breathing on RA Abdomen: soft,  non-tender, non-distended, +BS Neuro: opened eyes once to name, unresponsive otherwise  Laboratory:  Recent Labs Lab 08/06/15 0619 08/07/15 0708 08/08/15 0443  WBC 5.2 5.2 5.1  HGB 15.9 15.4 14.2  HCT 48.5 46.1 43.1  PLT 213 219 202    Recent Labs Lab 08/04/15 0228 08/05/15 0855 08/06/15 0619 08/08/15 0443  NA 138 139 139 141  K 3.5 3.6 3.5 2.8*  CL 104 105 110 110  CO2 22 25 22 22   BUN 9 <5* <5* <5*  CREATININE 0.98 0.90 0.92 0.81  CALCIUM 9.0 8.8* 8.8* 8.7*  PROT 5.7* 5.8* 6.2*  --   BILITOT 0.2* 0.3 0.3  --   ALKPHOS 60 60 62  --   ALT 13* 17 15*  --   AST 18 27 20   --   GLUCOSE 115* 179* 202* 166*    Imaging/Diagnostic Tests: Ct Head Wo Contrast 08/03/2015 IMPRESSION: No acute intracranial pathology.   Ct Angio Chest Pe W/cm &/or Wo Cm 08/04/2015  ADDENDUM REPORT: 08/04/2015 07:44 ADDENDUM: Critical Value/emergent results were called by telephone at the time of interpretation on 08/04/2015 at 7:35 AM to Dr. Junie Panning , who verbally acknowledged these results.   08/04/2015 IMPRESSION: Small pulmonary emboli in branches of the left lower lobe pulmonary artery. These small pulmonary emboli are nonobstructing. No more central pulmonary embolus. No demonstrable right heart strain. Prominence of the ascending thoracic aorta with a measured diameter 4.2 x 4.1 cm. Recommend annual imaging followup by CTA or MRA. This recommendation follows 2010 ACCF/AHA/AATS/ACR/ASA/SCA/SCAI/SIR/STS/SVM Guidelines for the Diagnosis and Management of Patients with Thoracic Aortic Disease. Circulation. 2010; 121: e266-e369 8 mm nodular opacity left upper lobe. Followup of this nodular opacity should be based on Fleischner Society guidelines. If the patient is at high risk for bronchogenic carcinoma, follow-up chest CT at 3-71months is recommended. If the patient is at low risk for bronchogenic carcinoma, follow-up chest CT at 6-12 months is recommended. This recommendation follows the consensus  statement: Guidelines for Management of Small Pulmonary Nodules Detected on CT Scans: A Statement from the [place] as published in Radiology 2005; 237:395-400. No frank edema or consolidation. Patchy atelectasis bilaterally. No demonstrable adenopathy.   Mr Jeri Cos Wo Contrast 08/03/2015 IMPRESSION: 1. Motion degraded study. 2. No acute intracranial process identified. 3. 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 4. Mild chronic small vessel ischemic disease.   Dg Chest Port 1 View 08/03/2015 IMPRESSION: No active disease.    Verner Mould, MD 08/09/2015, 12:55 PM PGY-1, [facility] Intern pager: [phone], text pages welcome

## 2015-08-09 NOTE — Progress Notes (Signed)
Inpatient Diabetes Program Recommendations  AACE/ADA: New Consensus Statement on Inpatient Glycemic Control (2015)  Target Ranges:  Prepandial:   less than 140 mg/dL      Peak postprandial:   less than 180 mg/dL (1-2 hours)      Critically ill patients:  140 - 180 mg/dL  Results for ALAKI, ROSEL (MRN [id number]) as of 08/09/2015 11:01  Ref. Range 08/07/2015 08:04 08/07/2015 12:54 08/07/2015 17:10 08/07/2015 19:47 08/07/2015 23:39 08/08/2015 03:19 08/08/2015 08:06 08/08/2015 12:38 08/08/2015 15:19 08/08/2015 21:01 08/09/2015 00:08 08/09/2015 04:06 08/09/2015 07:48  Glucose-Capillary Latest Ref Range: 65-99 mg/dL 203 (H) 169 (H) 193 (H) 189 (H) 210 (H) 158 (H) 192 (H) 164 (H) 170 (H) 151 (H) 152 (H) 133 (H) 142 (H)   Review of Glycemic Control  Diabetes history: DM2 Outpatient Diabetes medications: Lantus 45 units QHS, Novolog 10 units with meals Current orders for Inpatient glycemic control: CBGs ACHS; no insulin as inpatient  Inpatient Diabetes Program Recommendations: Insulin Outpatient: Initial finger stick glucose at time of arrival in ED on 08/03/15 was 43 mg/dl. Noted that patient has not received any basal or bolus insulin since being admitted. Glucose over the past 48 hours has ranged from 133-210 mg/dl without any insulin at all. At time of dishcarge, MD needs to re-evaluate outpatient insulin dosages. Please have patient follow up with PCP regarding glycemic control.  Thanks, Barnie Alderman, RN, MSN, CDE Diabetes Coordinator Inpatient Diabetes Program [phone] (Team Pager from [place] to [place]) [phone] ([facility] office) [phone] [facility] office) [phone] [facility] office)

## 2015-08-09 NOTE — Progress Notes (Addendum)
No charge note.  Palliative Medicine Team consult was received. A meeting has been scheduled for 2/28 at 2:30 pm with the patient's sister, [name].   If there are urgent needs or questions please call [phone]. Thank you for consulting our team to assist with this patient's care.  Imogene Burn, [place] Palliative Medicine Pager: [phone]

## 2015-08-09 NOTE — Progress Notes (Signed)
ANTICOAGULATION CONSULT NOTE - Follow Up Consult  Pharmacy Consult for Heparin to Lovenox Indication: new PE, hx afib (Xarelto on hold)  No Known Allergies  Patient Measurements: Height: 6\' 4"  (193 cm) Weight: 181 lb 3.5 oz (82.2 kg) IBW/kg (Calculated) : 86.8  Vital Signs: Temp: 98.8 F (37.1 C) (02/27 1344) Temp Source: Oral (02/27 1344) BP: 161/86 mmHg (02/27 1344) Pulse Rate: 85 (02/27 1344)  Labs:  Recent Labs  08/07/15 0708 08/08/15 0443 08/08/15 1034 08/08/15 1912 08/09/15 1310 08/09/15 1315  HGB 15.4 14.2  --   --  14.3  --   HCT 46.1 43.1  --   --  43.7  --   PLT 219 202  --   --  213  --   HEPARINUNFRC 0.52 <0.10* 0.16* 0.47  --   --   CREATININE  --  0.81  --   --  0.86 0.85    Estimated Creatinine Clearance: 112.8 mL/min (by C-G formula based on Cr of 0.85).   Assessment: 55 yo male admitted 2/21 with sepsis, AMS, unresponsive.  Found to have PE and initiated on IV heparin.    Heparin now being transitioned to Lovenox  Goal of Therapy:  Monitor platelets by anticoagulation protocol: Yes   Plan:  DC Heparin  Lovenox 80 mg sq Q 12 hours Follow up CBC  Thank you Anette Guarneri, PharmD [phone]  08/09/2015 3:09 PM

## 2015-08-09 NOTE — Evaluation (Signed)
Clinical/Bedside Swallow Evaluation Patient Details  Name: Steven [name] MRN: [id number] Date of Birth: 1960/09/30  Today's Date: 08/09/2015 Time: SLP Start Time (ACUTE ONLY): 1339 SLP Stop Time (ACUTE ONLY): 1349 SLP Time Calculation (min) (ACUTE ONLY): 10 min  Past Medical History:  Past Medical History  Diagnosis Date  . Uncontrolled diabetes mellitus ([place])   . HTN (hypertension)   . History of DVT (deep vein thrombosis)   . Personal history of diabetic foot ulcer   . Peripheral vascular disease ([place])     a. Abnl ABIs 2014.  Marland Kitchen Peripheral neuropathy ([place]) 11/19/2011  . Varicose vein     legs  . Osteomyelitis of foot, right, acute ([place]) 08/30/2012  . Personal history of colonic adenomas 05/29/2013  . History of cocaine use   . PAF (paroxysmal atrial fibrillation) ([place])     a. Dx 11/2013 in setting of sepsis/foot infection.  . Tobacco abuse   . H/O ETOH abuse   . Dysrhythmia     afib last admission  . Seasonal allergies   . Adjustment disorder with depressed mood    Past Surgical History:  Past Surgical History  Procedure Laterality Date  . Amputation  04/21/2011    Procedure: AMPUTATION RAY;  Surgeon: Newt Minion, MD;  Location: [facility];  Service: Orthopedics;  Laterality: Right;  Rt foot 2nd ray ampt  . I&d extremity  05/03/2011    Procedure: IRRIGATION AND DEBRIDEMENT EXTREMITY;  Surgeon: Newt Minion, MD;  Location: [facility];  Service: Orthopedics;  Laterality: Right;  Irrigation and Debridement Right Foot and Place Acell Xenograft  . Toe amputation  04/24/2012    great toe   right foot  . Amputation  04/24/2012    Procedure: AMPUTATION RAY;  Surgeon: Newt Minion, MD;  Location: [facility];  Service: Orthopedics;  Laterality: Right;  Right foot 1st ray amputation  . Amputation Left 04/23/2013    Procedure: AMPUTATION RAY;  Surgeon: Newt Minion, MD;  Location: [facility];  Service: Orthopedics;  Laterality: Left;  Left Foot 5th Ray Amputation  . Colonoscopy    . Amputation Left  11/21/2013    Procedure: AMPUTATION RAY;  Surgeon: Newt Minion, MD;  Location: [facility];  Service: Orthopedics;  Laterality: Left;  Left Foot 1st & 2nd Ray Amputation  . Amputation Right 12/04/2013    Procedure: AMPUTATION BELOW KNEE;  Surgeon: Marianna Payment, MD;  Location: [facility];  Service: Orthopedics;  Laterality: Right;  . Amputation Left 12/29/2013    Procedure: LEFT AMPUTATION BELOW KNEE;  Surgeon: Marianna Payment, MD;  Location: [facility];  Service: Orthopedics;  Laterality: Left;  . Amputation Right 04/15/2015    Procedure: AMPUTATION ABOVE KNEE, RIGHT;  Surgeon: Newt Minion, MD;  Location: [facility];  Service: Orthopedics;  Laterality: Right;   HPI:  55 y.o. male presenting with altered mental status. PMH is significant for DM, HTN, atrial fibrillation, polysubstance abuse (cocaine, tobacco, alcohol), h/o DVT, PVD, R AKA, L BKA.    Assessment / Plan / Recommendation Clinical Impression  Pt does not open his mouth to command or for acceptance of PO trials. SLP provided thermotactile and gustatory stimulation with ice chips and cold applesauce along his lips, which pt wiped away with his hands despite Max multimodal cueing to open his mouth or lick his lips. Unable to recommend a safe diet at this time. Will continue to follow for possible PO diet pending clearing mentation.    Aspiration Risk  Moderate  aspiration risk    Diet Recommendation NPO   Medication Administration: Via alternative means    Other  Recommendations Oral Care Recommendations: Oral care QID   Follow up Recommendations   (tba)    Frequency and Duration min 2x/week  2 weeks       Prognosis Prognosis for Safe Diet Advancement: Fair Barriers to Reach Goals: Other (Comment) (AMS)      Swallow Study   General HPI: 55 y.o. male presenting with altered mental status. PMH is significant for DM, HTN, atrial fibrillation, polysubstance abuse (cocaine, tobacco, alcohol), h/o DVT, PVD, R AKA, L BKA.  Type of Study:  Bedside Swallow Evaluation Previous Swallow Assessment: none in chart Diet Prior to this Study: NPO Temperature Spikes Noted: Yes (99.6) Respiratory Status: Room air History of Recent Intubation: No Behavior/Cognition: Alert;Doesn't follow directions Oral Cavity Assessment: Other (comment) (UTA - pt would not open mouth) Oral Care Completed by SLP: No (pt would not open his mouth) Oral Cavity - Dentition: Other (Comment) (UTA) Patient Positioning: Upright in bed Baseline Vocal Quality: Other (comment) (only moaning observed - sounded clear)    Oral/Motor/Sensory Function Overall Oral Motor/Sensory Function: Other (comment) (UTA)   Ice Chips Ice chips: Impaired Oral Phase Impairments: Other (comment) (poor oral acceptance)   Thin Liquid Thin Liquid: Not tested    Nectar Thick Nectar Thick Liquid: Not tested   Honey Thick Honey Thick Liquid: Not tested   Puree Puree: Impaired Presentation: Spoon Oral Phase Impairments: Other (comment) (poor oral acceptance)   Solid   GO   Solid: Not tested       Germain Osgood, M.A. CCC-SLP [phone]  Germain Osgood 08/09/2015,2:01 PM

## 2015-08-09 NOTE — Progress Notes (Signed)
Family Medicine Teaching Service Daily Progress Note Intern Pager: [phone]  Patient name: Steven [name] Medical record number: [id number] Date of birth: [birth year] Age: 55 y.o. Gender: male  Primary Care Provider: Chrisandra Netters, MD Consultants: neurology, ID orthopedics Code Status: FULL  Pt Overview and Major Events to Date:  2/21 - admitted to [facility]; one dose of zosyn; started CTX 2/22 - started vanc, amp, acyclovir; acyclovir and amp d/ced; heparin gtt started 2/25 - Ativan taper started  2/27 - scheduled Ativan d/ced; patient more alert; heparin gtt d/ced, Lovenox started  Assessment and Plan: Steven [name] is a 55 y.o. male presenting with altered mental status. PMH is significant for DM, HTN, atrial fibrillation, polysubstance abuse (cocaine, tobacco, alcohol), h/o DVT, PVD, R AKA, L BKA.   Acute encephalopathy: Primary insults include neuroglycopenia, seizure, cocaine effects, and possible stump infection. UDS + cocaine. Imaging negative. Urine and blood cultures with no growth (final). Seen by SLP yesterday who recommended continuing NPO. Seen by ID yesterday, who again feel that meningoencephalitis is unlikely cause of AMS, but recommend completing two week course of vanc and CTX (last dose 08/18/15). More alert since stopping scheduled Ativan yesterday. Now with concern for aphasia, as patient is alert and seems improved but not responding to questions.  - NPO pending improved mental status - Neurology signed off - ID signed off - Continue thiamine, folate - Continue vancomycin and rocephin (2/21 >> ) to cover possible meningoencephalitis and stump infection (last dose 08/18/15) - Palliative care consulted - will meet with patient's sister regarding goals of care this afternoon  Multiple pulmonary emboli: CTA 2/22: nonobstructive emboli in LLL pulm a. branches. Hemodynamically stable without hypoxemia. Recurrent clot in setting of nonadherence to xarelto regimen. Heparin gtt  stopped yesterday and Lovenox started.  - Continue therapeutic dose Lovenox - Monitor pulse ox  Hypoglycemia: Resolved. IDDM, last A1C 11.2 on 04/14/15. CBG 142, 147 overnight.  - Hold diabetic medications (Lantus 45U qHS, Vovolog 10U qAC)  - Monitor CBG q6h - Consider scheduled sliding scale if hyperglycemia noted - Continue D5 1/2NS while NPO  Hypokalemia: K 2.8 2/26 AM. Due to no PO intake. Renal function WNL. K slightly improved to 3.2 (2/27); received KDur.  - F/u AM BMP  HTN: Home meds: Lisinopril 20mg . Remains HTN at 160-169/81-89 white agitated overnight, but improved to 120/87 after receiving Haldol and becoming calmer.  - Holdinging oral antihypertensives - Improved control with hydralazine scheduled q6h. Still has prn dose as well.   Atrial Fibrillation:Outpatient medications: diltiazem 120mg , Xarelto 20mg , ASA 81mg . Required Metoprolol IV prn HR > 110bpm yesterday. Doubt cocaine is still in his system to cause unopposed alpha antagonism. HR slightly elevated when agitated, but mostly WNL overnight.  - Holding diltiazem - Anticoagulation with therapeutic dose Lovenox - Restart oral home medications when able  Alcohol abuse/illicit drug use and tobacco use: UDS pos for cocaine, neg serum EtOH. Discontinued scheduled Ativan yesterday in hopes of improving mental status - Continue CIWA (scores 11, 9, 0 (after Haldol) overnight) - Continue to monitor for agitation. May give PRN Ativan if necessary - Tramadol discontinued  Stump ulcer: s/p R AKA (performed in 11/16 by Dr. Sharol Given). MRI unobtainable due to motion artifact. Plain film with deep soft tissue air. Area not clinically appearing infected. Per ortho (Dr. Sharol Given), stump infection is unlikely cause of AMS. Per ID, continue two week course of vanc and CTX for coverage of possible stump infection and possible meningitis.  - Per ortho, continue Santyl  dressings. Current abx sufficient to cover possible infection.  - ID consulted,  signed off  - Cont vanc, CTX  FEN/GI: NPO, D5 1/2NS + 55mEq KCl@100cc /hr Prophylaxis: therapeutic dose Lovenox for PE  Disposition: monitor for clinical improvement  Subjective:  Patient very agitated over night, and continually tried to get out of bed. Patient actually fell out of bed, but did not hit his head and had no change in neurological status. He received one dose of Haldol, and his agitation improved.  Patient slightly more alert this AM, opening his eyes to his name more frequently than on my prior exams.   Objective: Temp:  [96.2 F (35.7 C)-98.8 F (37.1 C)] 98 F (36.7 C) (02/28 0611) Pulse Rate:  [74-118] 118 (02/28 0804) Resp:  [16-20] 16 (02/28 0611) BP: (120-169)/(81-131) 153/131 mmHg (02/28 0804) SpO2:  [96 %-97 %] 96 % (02/28 [id number]) Physical Exam: General: lying in bed resting comfortably, in NAD Cardiovascular: RRR, no murmurs appreciated Respiratory: CTAB, normal work of breathing on RA Abdomen: soft, non-tender, non-distended, +BS Neuro: opened eyes to name repeatedly, though could not follow commands or answer questions  Laboratory:  Recent Labs Lab 08/07/15 0708 08/08/15 0443 08/09/15 1310  WBC 5.2 5.1 4.9  HGB 15.4 14.2 14.3  HCT 46.1 43.1 43.7  PLT 219 202 213    Recent Labs Lab 08/04/15 0228 08/05/15 0855 08/06/15 0619 08/08/15 0443 08/09/15 1310 08/09/15 1315  NA 138 139 139 141 140 139  K 3.5 3.6 3.5 2.8* 3.2* 3.2*  CL 104 105 110 110 109 109  CO2 22 25 22 22  20* 19*  BUN 9 <5* <5* <5* <5* <5*  CREATININE 0.98 0.90 0.92 0.81 0.86 0.85  CALCIUM 9.0 8.8* 8.8* 8.7* 8.9 8.9  PROT 5.7* 5.8* 6.2*  --   --   --   BILITOT 0.2* 0.3 0.3  --   --   --   ALKPHOS 60 60 62  --   --   --   ALT 13* 17 15*  --   --   --   AST 18 27 20   --   --   --   GLUCOSE 115* 179* 202* 166* 177* 176*    Imaging/Diagnostic Tests: Ct Head Wo Contrast 08/03/2015 IMPRESSION: No acute intracranial pathology.   Ct Angio Chest Pe W/cm &/or Wo Cm 08/04/2015   ADDENDUM REPORT: 08/04/2015 07:44 ADDENDUM: Critical Value/emergent results were called by telephone at the time of interpretation on 08/04/2015 at 7:35 AM to Dr. Junie Panning , who verbally acknowledged these results.   08/04/2015 IMPRESSION: Small pulmonary emboli in branches of the left lower lobe pulmonary artery. These small pulmonary emboli are nonobstructing. No more central pulmonary embolus. No demonstrable right heart strain. Prominence of the ascending thoracic aorta with a measured diameter 4.2 x 4.1 cm. Recommend annual imaging followup by CTA or MRA. This recommendation follows 2010 ACCF/AHA/AATS/ACR/ASA/SCA/SCAI/SIR/STS/SVM Guidelines for the Diagnosis and Management of Patients with Thoracic Aortic Disease. Circulation. 2010; 121: e266-e369 8 mm nodular opacity left upper lobe. Followup of this nodular opacity should be based on Fleischner Society guidelines. If the patient is at high risk for bronchogenic carcinoma, follow-up chest CT at 3-62months is recommended. If the patient is at low risk for bronchogenic carcinoma, follow-up chest CT at 6-12 months is recommended. This recommendation follows the consensus statement: Guidelines for Management of Small Pulmonary Nodules Detected on CT Scans: A Statement from the [employer] as published in Radiology 2005; 237:395-400. No frank edema or consolidation.  Patchy atelectasis bilaterally. No demonstrable adenopathy.   Mr Jeri Cos Wo Contrast 08/03/2015 IMPRESSION: 1. Motion degraded study. 2. No acute intracranial process identified. 3. 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 4. Mild chronic small vessel ischemic disease.   Dg Chest Port 1 View 08/03/2015 IMPRESSION: No active disease.    Verner Mould, MD 08/10/2015, 9:12 AM PGY-1, [place] Intern pager: [phone], text pages welcome

## 2015-08-10 DIAGNOSIS — Z66 Do not resuscitate: Secondary | ICD-10-CM | POA: Insufficient documentation

## 2015-08-10 DIAGNOSIS — Z515 Encounter for palliative care: Secondary | ICD-10-CM | POA: Insufficient documentation

## 2015-08-10 DIAGNOSIS — R1310 Dysphagia, unspecified: Secondary | ICD-10-CM | POA: Insufficient documentation

## 2015-08-10 DIAGNOSIS — Z7189 Other specified counseling: Secondary | ICD-10-CM | POA: Insufficient documentation

## 2015-08-10 LAB — CBC
HCT: 44 % (ref 39.0–52.0)
Hemoglobin: 14.6 g/dL (ref 13.0–17.0)
MCH: 28.1 pg (ref 26.0–34.0)
MCHC: 33.2 g/dL (ref 30.0–36.0)
MCV: 84.6 fL (ref 78.0–100.0)
PLATELETS: 214 10*3/uL (ref 150–400)
RBC: 5.2 MIL/uL (ref 4.22–5.81)
RDW: 15.4 % (ref 11.5–15.5)
WBC: 5.3 10*3/uL (ref 4.0–10.5)

## 2015-08-10 LAB — BASIC METABOLIC PANEL
ANION GAP: 7 (ref 5–15)
BUN: <5 mg/dL — ABNORMAL LOW (ref 6–20)
CALCIUM: 9.1 mg/dL (ref 8.9–10.3)
CO2: 22 mmol/L (ref 22–32)
Chloride: 112 mmol/L — ABNORMAL HIGH (ref 101–111)
Creatinine, Ser: 0.85 mg/dL (ref 0.61–1.24)
GLUCOSE: 130 mg/dL — AB (ref 65–99)
POTASSIUM: 3.6 mmol/L (ref 3.5–5.1)
SODIUM: 141 mmol/L (ref 135–145)

## 2015-08-10 LAB — GLUCOSE, CAPILLARY
GLUCOSE-CAPILLARY: 147 mg/dL — AB (ref 65–99)
GLUCOSE-CAPILLARY: 142 mg/dL — AB (ref 65–99)
GLUCOSE-CAPILLARY: 150 mg/dL — AB (ref 65–99)
GLUCOSE-CAPILLARY: 183 mg/dL — AB (ref 65–99)
Glucose-Capillary: 157 mg/dL — ABNORMAL HIGH (ref 65–99)

## 2015-08-10 MED ORDER — POTASSIUM CHLORIDE 10 MEQ/100ML IV SOLN
INTRAVENOUS | Status: AC
  Administered 2015-08-10: 10 meq
  Filled 2015-08-10: qty 100

## 2015-08-10 MED ORDER — POTASSIUM CHLORIDE 10 MEQ/100ML IV SOLN
INTRAVENOUS | Status: AC
  Filled 2015-08-10: qty 100

## 2015-08-10 MED ORDER — INSULIN ASPART 100 UNIT/ML ~~LOC~~ SOLN
0.0000 [IU] | Freq: Three times a day (TID) | SUBCUTANEOUS | Status: DC
  Administered 2015-08-10 – 2015-08-12 (×5): 2 [IU] via SUBCUTANEOUS
  Administered 2015-08-12: 9 [IU] via SUBCUTANEOUS

## 2015-08-10 MED ORDER — HALOPERIDOL LACTATE 5 MG/ML IJ SOLN
1.0000 mg | Freq: Once | INTRAMUSCULAR | Status: AC
  Administered 2015-08-10: 1 mg via INTRAVENOUS
  Filled 2015-08-10: qty 1

## 2015-08-10 MED ORDER — MORPHINE SULFATE (PF) 2 MG/ML IV SOLN
2.0000 mg | Freq: Once | INTRAVENOUS | Status: AC
  Administered 2015-08-10: 2 mg via INTRAVENOUS
  Filled 2015-08-10: qty 1

## 2015-08-10 NOTE — Progress Notes (Signed)
PT Cancellation Note  Patient Details Name: Steven [name] MRN: [id number] DOB: 06/12/1961   Cancelled Treatment:    Reason Eval/Treat Not Completed: Patient at procedure or test/unavailable. Noted Palliative Care meeting for goals of care (currently at pt's bedside).   Veanna Dower 08/10/2015, 3:02 PM  Pager [phone]

## 2015-08-10 NOTE — Progress Notes (Signed)
Before Fall patient was disoriented x4 post fall no change in neurological status. Patient did not have any c/o pain post fall. Notified Cordelia Poche, MD at [phone]. BP169/79; [id number] RR 18; O298%. Moved patient closer to nursing station for closer observation.

## 2015-08-10 NOTE — Progress Notes (Signed)
Notified MD in regards to patient pulling off telemetry monitor and unable to keep on. Orders to continue monitoring.

## 2015-08-10 NOTE — Consult Note (Signed)
Consultation Note Date: 08/10/2015   Patient Name: Steven [name]  DOB: 1960-07-26  MRN: [id number]  Age / Sex: 55 y.o., male  PCP: Steven Rio, MD Referring Physician: Leeanne Rio, MD  Steven [name] is a 55 year old male with bilateral lower extremity amputations, diabetes mellitus, history of peripheral vascular disease and diabetic foot ulcer with osteomyelitis. He also has a history of cocaine and alcohol abuse as well as atrial fibrillation. He was admitted to [facility] on February 21 with altered mental status and hypoglycemia. He was cocaine positive.   Reason for Consultation: Establishing goals of care    Clinical Assessment/Narrative:  I met with the patient and his sister Steven [name]. There are 4 siblings, but Steven [name] is the only one close to Steven [name] (the patient).  When hospital staff and MDs approach the patient he will not speak, but when his sister is with him he laughs out loud and attempts to speak several phrases.  Per Steven [name], the patient is right handed, but has never been able to read or write.  I discussed his goals of care with Steven [name].  She tells me that in the past Steven [name] made himself a DO NOT RESUSCITATE and she believes she would want to be a DO NOT RESUSCITATE now. However, she is very attached to him and wants him to have full scope of treatment with the exception of heroic measures. We discussed that he will need a skilled nursing facility at discharge and she was accepting of this. 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 She expressed desire for him to be placed anywhere in [place] but preferably at either [facility] or the facility that used to be called [facility].   We discussed nutrition and aspiration. We discussed PEG tubes and recurrent aspiration pneumonia. Steven [name] fed Steven [name] a container of applesauce and he did well.  She gave him sips of water and he aspirated. Steven [name] would like for him to be fed the safest diet possible. She understands and accepts the risk of aspiration.  Certainly if he is able to eat enough to sustain himself, she would like to avoid a PEG tube.  Steven [name] asked to be called by the MD taking care of her brother in order to discuss how he is doing and when he may be appropriate for discharge.  Steven [name] is attempting to figure out whether or not Steven [name] will be a long term SNF resident.  (whether or not to give up his apartment).   Contacts/Participants in Discussion: Sister Steven [name] Primary Decision Maker: Steven [name]   SUMMARY OF RECOMMENDATIONS  Code Status/Advance Care Planning: DNR   Additional Recommendations (Limitations, Scope, Preferences):  Full Scope Treatment with DNR / DNI  Attempt a diet.  Sister understands risk of aspiration.  I will add pureed diet w/ honey thick liquids and add SSI sensitive - to be adjusted by the primary team.  Patient will need SNF on discharge.  **Thank you for allowing the Palliative team to participate in Steven [name] care.  If we may be of further assistance please do not hesitate to call us back.     Psycho-social/Spiritual:  Support System: Adequate   Prognosis: Unable to determine  Discharge Planning: to SNF when appropriate.   Chief Complaint/ Primary Diagnoses: Present on Admission:  . Altered mental status  I have reviewed the medical record, interviewed the patient and family, and examined the patient. The following aspects are  pertinent.  Past Medical History  Diagnosis Date  . Uncontrolled diabetes mellitus ([place])   . HTN (hypertension)   . History of DVT (deep vein thrombosis)   . Personal history of diabetic foot ulcer   . Peripheral vascular disease ([place])     a. Abnl ABIs 2014.  Marland Kitchen Peripheral neuropathy ([place]) 11/19/2011  . Varicose vein     legs  . Osteomyelitis of foot, right, acute ([place]) 08/30/2012  . Personal history of colonic  adenomas 05/29/2013  . History of cocaine use   . PAF (paroxysmal atrial fibrillation) ([place])     a. Dx 11/2013 in setting of sepsis/foot infection.  . Tobacco abuse   . H/O ETOH abuse   . Dysrhythmia     afib last admission  . Seasonal allergies   . Adjustment disorder with depressed mood    Social History   Social History  . Marital Status: Single    Spouse Name: N/A  . Number of Children: N/A  . Years of Education: N/A   Social History Main Topics  . Smoking status: Current Every Day Smoker -- 0.50 packs/day for 30 years    Types: Cigarettes    Start date: 06/13/1983  . Smokeless tobacco: Never Used  . Alcohol Use: 3.0 - 3.6 oz/week    5-6 Standard drinks or equivalent per week     Comment: t states he hs rank any alcholol rcently.  drinks off and on  . Drug Use: No     Comment: ast use of cocane an marjuana. denies  . Sexual Activity: Yes    Birth Control/ Protection: Injection     Comment: on patch   Other Topics Concern  . None   Social History Narrative   Family History  Problem Relation Age of Onset  . Diabetes Mother   . Stroke Brother   . Colon cancer Neg Hx    Scheduled Meds: . antiseptic oral rinse  7 mL Mouth Rinse q12n4p  . cefTRIAXone (ROCEPHIN)  IV  2 g Intravenous Q24H  . chlorhexidine  15 mL Mouth Rinse BID  . collagenase   Topical Daily  . enoxaparin (LOVENOX) injection  80 mg Subcutaneous Q12H  . folic acid  1 mg Intravenous Daily  . hydrALAZINE  5 mg Intravenous Q6H  . sodium chloride flush  3 mL Intravenous Q12H  . thiamine  100 mg Intravenous Daily  . vancomycin  1,000 mg Intravenous Q12H   Continuous Infusions: . dextrose 5 % and 0.45% NaCl 1,000 mL with potassium chloride 20 mEq infusion 100 mL/hr at 08/10/15 1337   PRN Meds:.acetaminophen, hydrALAZINE, LORazepam, metoprolol Medications Prior to Admission:  Prior to Admission medications   Medication Sig Start Date End Date Taking? Authorizing Provider  acetaminophen (TYLENOL) 325  MG tablet Take 2 tablets (650 mg total) by mouth every 6 (six) hours as needed. Patient taking differently: Take 650 mg by mouth every 6 (six) hours as needed for mild pain.  06/28/15  Yes Hillary Corinda Gubler, MD  cetirizine (ZYRTEC) 10 MG tablet Take 1 tablet (10 mg total) by mouth daily. 05/04/15  Yes Steven Rio, MD  cholecalciferol (VITAMIN D) 1000 units tablet Take 1,000 Units by mouth daily.   Yes Historical Provider, MD  doxycycline (VIBRAMYCIN) 100 MG capsule Take 100 mg by mouth 2 (two) times daily.   Yes Historical Provider, MD  gabapentin (NEURONTIN) 400 MG capsule Take 2 capsules (800 mg total) by mouth 3 (three) times daily. 04/27/15  Yes Olin Hauser  S Love, PA-C  insulin aspart (NOVOLOG) 100 UNIT/ML injection Inject 10 units daily with meals. 06/28/15  Yes Hillary Corinda Gubler, MD  insulin glargine (LANTUS) 100 UNIT/ML injection Inject 45 units nightly. 06/28/15  Yes Hillary Corinda Gubler, MD  iron polysaccharides (NIFEREX) 150 MG capsule Take 1 capsule (150 mg total) by mouth daily. 06/28/15  Yes Hillary Corinda Gubler, MD  lisinopril (PRINIVIL,ZESTRIL) 20 MG tablet take 1 tablet by mouth once daily for blood pressure 06/01/15  Yes Steven Rio, MD  methocarbamol (ROBAXIN) 500 MG tablet Take 1 tablet (500 mg total) by mouth every 6 (six) hours as needed for muscle spasms. 07/30/15  Yes Steven Rio, MD  Multiple Vitamin (MULTIVITAMIN WITH MINERALS) TABS tablet Take 1 tablet by mouth daily. 04/27/15  Yes Ivan Anchors Love, PA-C  rivaroxaban (XARELTO) 20 MG TABS tablet Take 1 tablet (20 mg total) by mouth daily with supper. For Atrial Fibrillation 06/28/15  Yes Hillary Corinda Gubler, MD  SSD 1 % cream Apply 1 application topically daily as needed. Rash/irritation 07/27/15  Yes Historical Provider, MD  traMADol (ULTRAM) 50 MG tablet Take 1 tablet (50 mg total) by mouth 4 (four) times daily -  before meals and at bedtime. 04/27/15  Yes Bary Leriche, PA-C  aspirin EC 81 MG  tablet Take 2 tablets (162 mg total) by mouth daily. Patient not taking: Reported on 08/03/2015 04/27/15   Ivan Anchors Love, PA-C  atorvastatin (LIPITOR) 40 MG tablet Take 1 tablet (40 mg total) by mouth daily. Patient not taking: Reported on 04/14/2015 03/31/14   Steven Rio, MD  diltiazem (CARDIZEM CD) 120 MG 24 hr capsule Take 1 capsule (120 mg total) by mouth daily. Patient not taking: Reported on 08/03/2015 05/04/15   Steven Rio, MD  fluticasone [facility]) 50 MCG/ACT nasal spray Place 2 sprays into both nostrils daily. Patient not taking: Reported on 04/14/2015 09/09/14   Steven Rio, MD  folic acid (FOLVITE) 1 MG tablet Take 1 tablet (1 mg total) by mouth daily. Patient not taking: Reported on 04/14/2015 12/18/13   Ivan Anchors Love, PA-C  nicotine (NICODERM CQ - DOSED IN MG/24 HOURS) 21 mg/24hr patch Place 1 patch (21 mg total) onto the skin daily. Patient not taking: Reported on 08/03/2015 04/19/15   Smiley Houseman, MD  oxyCODONE 10 MG TABS Take 0.5-1 tablets (5-10 mg total) by mouth every 6 (six) hours as needed for severe pain. Patient not taking: Reported on 08/03/2015 04/27/15   Ivan Anchors Love, PA-C  pantoprazole (PROTONIX) 40 MG tablet Take 1 tablet (40 mg total) by mouth at bedtime. Patient not taking: Reported on 04/14/2015 09/25/14   Steven Rio, MD  polyethylene glycol [facility] Floria Raveling) packet Take 17 g by mouth daily as needed. For constipation Patient not taking: Reported on 08/03/2015 04/27/15   Ivan Anchors Love, PA-C  thiamine 100 MG tablet Take 1 tablet (100 mg total) by mouth daily. Patient not taking: Reported on 08/03/2015 04/27/15   Ivan Anchors Love, PA-C   No Known Allergies  Review of Systems:  Unable to give.  Very limited speech  Physical Exam  55 yo male.  Awake, alert, NAD, bilateral amputee.  Sister at bedside. CV:  irreg irreg Resp:  No w/c/r Abdomen:  Soft, nt, nd, +bs Neuro:  Follows some commands.  Mostly able to copy someone elses  movement.  Uncertain he completely understands/comprehends.   Vital Signs: BP 156/122 mmHg  Pulse 92  Temp(Src) 98.3 F (36.8 C) (  Oral)  Resp 18  Ht _0  (1.93 m)  Wt 82.2 kg (181 lb 3.5 oz)  BMI 22.07 kg/m2  SpO2 94%  SpO2: SpO2: 94 % O2 Device:SpO2: 94 % O2 Flow Rate: .O2 Flow Rate (L/min): 2 L/min  IO: Intake/output summary:  Intake/Output Summary (Last 24 hours) at 08/10/15 1601 Last data filed at 08/10/15 1405  Gross per 24 hour  Intake      0 ml  Output   1450 ml  Net  -1450 ml    LBM: Last BM Date: 08/09/15 Baseline Weight: Weight: 91.6 kg (201 lb 15.1 oz) Most recent weight: Weight: 82.2 kg (181 lb 3.5 oz)      Palliative Assessment/Data:  Flowsheet Rows        Most Recent Value   Intake Tab    Referral Department  -- [family medicine]   Unit at Time of Referral  Med/Surg Unit   Palliative Care Primary Diagnosis  Neurology   Date Notified  08/08/15   Palliative Care Type  New Palliative care   Date of Admission  08/03/15   Date first seen by Palliative Care  08/09/15   # of days Palliative referral response time  1 Day(s)   # of days IP prior to Palliative referral  5   Clinical Assessment    Palliative Performance Scale Score  30%   Psychosocial & Spiritual Assessment    Palliative Care Outcomes    Patient/Family meeting held?  Yes   Who was at the meeting?  patient and sister, Steven [name] Outcomes  Clarified goals of care   Patient/Family wishes: Interventions discontinued/not started   Mechanical Ventilation      Additional Data Reviewed:  CBC:    Component Value Date/Time   WBC 5.3 08/10/2015 0836   HGB 14.6 08/10/2015 0836   HCT 44.0 08/10/2015 0836   PLT 214 08/10/2015 0836   MCV 84.6 08/10/2015 0836   NEUTROABS 1.8 08/05/2015 0855   LYMPHSABS 2.2 08/05/2015 0855   MONOABS 0.6 08/05/2015 0855   EOSABS 0.1 08/05/2015 0855   BASOSABS 0.1 08/05/2015 0855   Comprehensive Metabolic Panel:    Component Value Date/Time     NA 141 08/10/2015 0836   K 3.6 08/10/2015 0836   CL 112* 08/10/2015 0836   CO2 22 08/10/2015 0836   BUN <5* 08/10/2015 0836   CREATININE 0.85 08/10/2015 0836   CREATININE 0.91 11/26/2014 1125   GLUCOSE 130* 08/10/2015 0836   CALCIUM 9.1 08/10/2015 0836   AST 20 08/06/2015 0619   ALT 15* 08/06/2015 0619   ALKPHOS 62 08/06/2015 0619   BILITOT 0.3 08/06/2015 0619   PROT 6.2* 08/06/2015 0619   ALBUMIN 2.2* 08/06/2015 0619     Time In: 2:00 Time Out: 3:10 Time Total: 70 min Greater than 50%  of this time was spent counseling and coordinating care related to the above assessment and plan.  Signed by: Imogene Burn, PA-C Palliative Medicine Pager: [phone]  08/10/2015, 4:01 PM  Please contact Palliative Medicine Team phone at [phone] for questions and concerns.

## 2015-08-10 NOTE — Progress Notes (Signed)
   08/10/15 0300  Follow Up  MD notified Cordelia Poche  Time MD notified [phone]  Family notified Yes-comment (Left a message to call Floor)  Time family notified [phone]  Additional tests No  Progress note created (see row info) Yes

## 2015-08-10 NOTE — Progress Notes (Signed)
Speech Language Pathology Treatment: Dysphagia  Patient Details Name: Steven [name] MRN: [id number] DOB: March 31, 1961 Today's Date: 08/10/2015 Time: [id number] SLP Time Calculation (min) (ACUTE ONLY): 8 min  Assessment / Plan / Recommendation Clinical Impression  Pt alert today and initially continuing to refuse POs. With thermotactile stimulation with ice chips, he does ultimately open his mouth to take in three ice chip trials. Cues provided for sustained attention during bolus preparation, and immediate coughing follows pharyngeal swallow. Recommend to continue NPO.   HPI HPI: 55 y.o. male presenting with altered mental status. PMH is significant for DM, HTN, atrial fibrillation, polysubstance abuse (cocaine, tobacco, alcohol), h/o DVT, PVD, R AKA, L BKA.       SLP Plan  Continue with current plan of care     Recommendations  Diet recommendations: NPO Medication Administration: Via alternative means             Oral Care Recommendations: Oral care QID Follow up Recommendations:  (tba) Plan: Continue with current plan of care     GO               Steven [name], M.A. CCC-SLP [phone]  Steven [name] 08/10/2015, 2:20 PM

## 2015-08-10 NOTE — Progress Notes (Signed)
Notified MD of patient fall. See MAR for new orders,

## 2015-08-10 NOTE — Progress Notes (Signed)
Active with Arville Go PTA for RN only

## 2015-08-10 NOTE — Progress Notes (Signed)
Notified of patient having c/o pain and patient appearing very anxious. See MAR for new orders at this time

## 2015-08-11 LAB — CBC
HCT: 43.9 % (ref 39.0–52.0)
Hemoglobin: 14.1 g/dL (ref 13.0–17.0)
MCH: 27 pg (ref 26.0–34.0)
MCHC: 32.1 g/dL (ref 30.0–36.0)
MCV: 83.9 fL (ref 78.0–100.0)
PLATELETS: 249 10*3/uL (ref 150–400)
RBC: 5.23 MIL/uL (ref 4.22–5.81)
RDW: 15.4 % (ref 11.5–15.5)
WBC: 5.8 10*3/uL (ref 4.0–10.5)

## 2015-08-11 LAB — GLUCOSE, CAPILLARY
GLUCOSE-CAPILLARY: 175 mg/dL — AB (ref 65–99)
GLUCOSE-CAPILLARY: 91 mg/dL (ref 65–99)
Glucose-Capillary: 152 mg/dL — ABNORMAL HIGH (ref 65–99)
Glucose-Capillary: 159 mg/dL — ABNORMAL HIGH (ref 65–99)
Glucose-Capillary: 161 mg/dL — ABNORMAL HIGH (ref 65–99)

## 2015-08-11 LAB — VANCOMYCIN, TROUGH: Vancomycin Tr: 22 ug/mL — ABNORMAL HIGH (ref 10.0–20.0)

## 2015-08-11 MED ORDER — DILTIAZEM HCL ER COATED BEADS 120 MG PO CP24
120.0000 mg | ORAL_CAPSULE | Freq: Every day | ORAL | Status: DC
Start: 2015-08-11 — End: 2015-08-12
  Administered 2015-08-11: 120 mg via ORAL
  Filled 2015-08-11 (×2): qty 1

## 2015-08-11 MED ORDER — VANCOMYCIN HCL IN DEXTROSE 750-5 MG/150ML-% IV SOLN
750.0000 mg | Freq: Two times a day (BID) | INTRAVENOUS | Status: DC
  Administered 2015-08-11 – 2015-08-12 (×3): 750 mg via INTRAVENOUS
  Filled 2015-08-11 (×4): qty 150

## 2015-08-11 MED ORDER — SODIUM CHLORIDE 0.9% FLUSH
10.0000 mL | INTRAVENOUS | Status: DC | PRN
  Administered 2015-08-12: 10 mL
  Filled 2015-08-11: qty 40

## 2015-08-11 MED ORDER — LISINOPRIL 20 MG PO TABS
20.0000 mg | ORAL_TABLET | Freq: Every day | ORAL | Status: DC
  Administered 2015-08-11: 20 mg via ORAL
  Filled 2015-08-11 (×3): qty 1

## 2015-08-11 NOTE — Progress Notes (Signed)
Nutrition Follow-up  DOCUMENTATION CODES:   Not applicable  INTERVENTION:   -Magic Cup TID with meals  NUTRITION DIAGNOSIS:   Increased nutrient needs related to wound healing as evidenced by estimated needs.  Ongoing  GOAL:   Patient will meet greater than or equal to 90% of their needs  Unmet  MONITOR:   PO intake, Supplement acceptance, Skin, Weight trends, Labs  REASON FOR ASSESSMENT:   Low Braden    ASSESSMENT:   55 y.o. male presenting with altered mental status. 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   Pt was evaluated by SLP on 08/09/15, which recommended NPO with nutrition via alternative means.   Family meeting held on 08/10/15. Plan is for pt to undergo full scope of medical treatment with no heroic measures. Pt was advanced to a dysphagia 1 diet with honey thick liquids.   Pt lying in bed at bedside. Per chart review, pt has been very obtunded earlier this hospitalization, but was able to acknowledge this RD. He responded "yeah" to most questions and was able to acknowledge that he ate food this morning. Per doc flowsheets, meal intake has been poor (10%). RD will add supplement to help optimize nutritional status.   CSW following. Plan to d/c to SNF at discharge.   Labs reviewed: CBGS: 157-175.   Diet Order:  DIET - DYS 1 Room service appropriate?: Yes; Fluid consistency:: Honey Thick  Skin:  Wound (see comment) (open wound on rt stump)  Last BM:  08/11/15  Height:   Ht Readings from Last 1 Encounters:  08/03/15 6\' 4"  (1.93 m)    Weight:   Wt Readings from Last 1 Encounters:  08/07/15 181 lb 3.5 oz (82.2 kg)    Ideal Body Weight:  78.5 kg  BMI:  Body mass index is 22.07 kg/(m^2).  Estimated Nutritional Needs:   Kcal:  2200-2400  Protein:  120-140 gm  Fluid:  2.2-2.4  L  EDUCATION NEEDS:   No education needs identified at this time  Colan Laymon A. Jimmye Norman, RD, LDN, CDE Pager: [phone] After hours Pager: [phone]

## 2015-08-11 NOTE — Consult Note (Signed)
Pharmacy Antibiotic Note  EDRAS STOLTE is a 55 y.o. male admitted on 08/03/2015 with sepsis.  Pharmacy has been consulted for vanc dosing. A vancomycin trough today was elevated at 22.   Plan: -Decrease Vancomycin to 750 mg iv Q 12 - F/u renal cxn, C&S, clinical status and trough at [facility]  Planning 2 weeks of antibiotic therapy   Height: 6\' 4"  (193 cm) Weight: 181 lb 3.5 oz (82.2 kg) IBW/kg (Calculated) : 86.8  Temp (24hrs), Avg:98.5 F (36.9 C), Min:98 F (36.7 C), Max:99 F (37.2 C)   Recent Labs Lab 08/06/15 0619 08/07/15 0708 08/07/15 1531 08/08/15 0443 08/09/15 1310 08/09/15 1315 08/10/15 0836 08/11/15 0642 08/11/15 0854  WBC 5.2 5.2  --  5.1 4.9  --  5.3 5.8  --   CREATININE 0.92  --   --  0.81 0.86 0.85 0.85  --   --   VANCOTROUGH  --   --  29*  --   --   --   --   --  22*    Estimated Creatinine Clearance: 112.8 mL/min (by C-G formula based on Cr of 0.85).    No Known Allergies  Antimicrobials this admission: 2/22 Acyclovir >> 2/23 2/22 Ampicillin >> 2/23 2/22 Rocephin >> 2/21 Vanco>> 2/21 Zosyn >>2/21   Dose adjustments this admission: 2/25 VT = 29, changed to 1gm Q12H 3/1 VT = 22, decrease vancomycin to 750 mg iv Q 12  Microbiology results: 2/21 blood x2- ngtd 2/21 urine- neg 2/22 MRSA PCR - neg  Thank you for allowing pharmacy to be a part of this patient's care.  Tad Moore 08/11/2015 10:56 AM

## 2015-08-11 NOTE — Progress Notes (Signed)
Peripherally Inserted Central Catheter/Midline Placement  The IV Nurse has discussed with the patient and/or persons authorized to consent for the patient, the purpose of this procedure and the potential benefits and risks involved with this procedure.  The benefits include less needle sticks, lab draws from the catheter and patient may be discharged home with the catheter.  Risks include, but not limited to, infection, bleeding, blood clot (thrombus formation), and puncture of an artery; nerve damage and irregular heat beat.  Alternatives to this procedure were also discussed.  PICC/Midline Placement Documentation        Steven [name] 08/11/2015, 2:34 PM

## 2015-08-11 NOTE — Evaluation (Signed)
Occupational Therapy Evaluation Patient Details Name: Steven [name] MRN: [id number] DOB: [birth year] Today's Date: 08/11/2015    History of Present Illness this 55 y.o. male admitted with AMS.  Dx: acute encephalopathy possibly due to  neuroglyopenia, seizure, cocaine, and possibly residual limb infection.   UDS + for cocaine; multiple pulmonary emboli per CTA 2/22, hypoglycemia, hypokalemia, A-Fib, ulcer of residual limb.  PMH includes:  ETOH and cocaine abuse.  Rt AKA, Lt BKA, DM, HTN, DVT   Clinical Impression   Pt admitted with above. He demonstrates the below listed deficits and will benefit from continued OT to maximize safety and independence with BADLs.  Pt presents to OT with impaired communication, impaired cognition, generalized weakness.  His HR was 118-158 at rest so eval was limited to bed level activity.  Pt incontinent of bowel with no awareness.  He required total A for clean up.  He is able to perform simple grooming task with min A to initiate task, but otherwise, unable to engage in any other ADL tasks.  He requires min - mod A for rolling in bed as he requires assist to initiate and follow through with movement.   He will need SNF level rehab at discharge.       Follow Up Recommendations  SNF    Equipment Recommendations  None recommended by OT    Recommendations for Other Services       Precautions / Restrictions Precautions Precautions: Fall      Mobility Bed Mobility Overal bed mobility: Needs Assistance Bed Mobility: Rolling Rolling: Mod assist;Min assist         General bed mobility comments: Pt required assist due to cognitive and communication deficits.  He is unable to follow commands consistently, requiring physical assistance to initiate and complete movement   Transfers                 General transfer comment: did not attempt due to elevated HR     Balance                                            ADL Overall  ADL's : Needs assistance/impaired     Grooming: Wash/dry hands;Wash/dry face;Bed level;Moderate assistance Grooming Details (indicate cue type and reason): Pt washed face with min A to initiate and max A to wash hands Upper Body Bathing: Total assistance;Bed level   Lower Body Bathing: Total assistance;Bed level   Upper Body Dressing : Total assistance;Bed level   Lower Body Dressing: Total assistance;Bed level   Toilet Transfer: Total assistance Toilet Transfer Details (indicate cue type and reason): unable to attempt safely Toileting- Clothing Manipulation and Hygiene: Total assistance;Bed level Toileting - Clothing Manipulation Details (indicate cue type and reason): Pt incontinent of stool.  Assisted with peri care and clean up      Functional mobility during ADLs: Moderate assistance (bed mobility only )       Vision     Perception     Praxis      Pertinent Vitals/Pain Pain Assessment: Faces Faces Pain Scale: No hurt     Hand Dominance Right   Extremity/Trunk Assessment Upper Extremity Assessment Upper Extremity Assessment: Generalized weakness   Lower Extremity Assessment Lower Extremity Assessment: Defer to PT evaluation       Communication Communication Communication: Receptive difficulties;Expressive difficulties   Cognition Arousal/Alertness: Awake/alert Behavior During Therapy: [facility]  for tasks assessed/performed Overall Cognitive Status: Impaired/Different from baseline Area of Impairment: Following commands;Attention   Current Attention Level: Focused   Following Commands: Follows one step commands inconsistently       General Comments: Pt follows intermittent one step commands with gestural and verbal  cues.  Laughs inappropriately in response to commands, or questions asked.  He was able to answer 3 questions with simple 1-2 word responses     General Comments       Exercises       Shoulder Instructions      Home Living Family/patient  expects to be discharged to:: Skilled nursing facility                                        Prior Functioning/Environment Level of Independence: Independent with assistive device(s);Needs assistance  Gait / Transfers Assistance Needed: Per pt's sister, pt was mod I at w/c level  ADL's / Homemaking Assistance Needed: Per pt's sister, pt was mod I with ADLs, but required asist with IADLs.     Comments: Pt uses public transportation.  Had aide 3 hours/day who assisted with meals and household management     OT Diagnosis: Generalized weakness;Cognitive deficits;Altered mental status   OT Problem List: Decreased strength;Decreased activity tolerance;Impaired balance (sitting and/or standing);Decreased cognition;Decreased safety awareness;Decreased knowledge of use of DME or AE;Decreased knowledge of precautions;Cardiopulmonary status limiting activity   OT Treatment/Interventions: Self-care/ADL training;DME and/or AE instruction;Therapeutic activities;Patient/family education;Balance training;Therapeutic exercise    OT Goals(Current goals can be found in the care plan section) Acute Rehab OT Goals Patient Stated Goal: sister is hopeful that he will be able to regain independence  OT Goal Formulation: With patient/family Time For Goal Achievement: 08/25/15 Potential to Achieve Goals: Fair ADL Goals Pt Will Perform Grooming: with supervision;sitting Pt Will Perform Upper Body Bathing: with min assist;sitting Additional ADL Goal #1: Pt will follow simple commands at least 75% of time during ADL tasks Additional ADL Goal #2: Pt will sit EOB with min guard assist while completing simple thearpeutic activities   OT Frequency: Min 2X/week   Barriers to D/C: Decreased caregiver support          Co-evaluation PT/OT/SLP Co-Evaluation/Treatment: Yes Reason for Co-Treatment: For patient/therapist safety   OT goals addressed during session: ADL's and self-care      End of  Session Nurse Communication: Mobility status  Activity Tolerance: Other (comment) (elevated HR ) Patient left: in bed;with call bell/phone within reach;with bed alarm set;with family/visitor present   Time: 1131-1157 OT Time Calculation (min): 26 min Charges:  OT General Charges $OT Visit: 1 Procedure OT Evaluation $OT Eval Moderate Complexity: 1 Procedure G-Codes:    Sabrinna Yearwood M 2015-09-09, 12:34 PM

## 2015-08-11 NOTE — Evaluation (Signed)
Physical Therapy Evaluation Patient Details Name: Steven [name] MRN: [id number] DOB: 1960-07-18 Today's Date: 08/11/2015   History of Present Illness  this 55 y.o. male admitted with AMS.  Dx: acute encephalopathy possibly due to  neuroglyopenia, seizure, cocaine, and possibly residual limb infection.   UDS + for cocaine; multiple pulmonary emboli per CTA 2/22, hypoglycemia, hypokalemia, A-Fib, ulcer of residual limb.  PMH includes:  ETOH and cocaine abuse.  Rt AKA, Lt BKA, DM, HTN, DVT  Clinical Impression  Patient presents with decreased cognition, decreased safety, decreased independence with mobility and ADL;s and will need skilled PT in the acute setting to allow decreased burden of care in next venue.  Recommending SNF level rehab at d/c.    Follow Up Recommendations SNF;Supervision/Assistance - 24 hour    Equipment Recommendations  None recommended by PT    Recommendations for Other Services       Precautions / Restrictions Precautions Precautions: Fall Precaution Comments: L AKA, R BKA      Mobility  Bed Mobility Overal bed mobility: Needs Assistance Bed Mobility: Rolling Rolling: Mod assist;Min assist         General bed mobility comments: Pt required assist due to cognitive and communication deficits.  He is unable to follow commands consistently, requiring physical assistance to initiate and complete movement   Transfers                 General transfer comment: did not attempt due to elevated HR   Ambulation/Gait                Stairs            Wheelchair Mobility    Modified Rankin (Stroke Patients Only)       Balance                                             Pertinent Vitals/Pain Pain Assessment: Faces Faces Pain Scale: Hurts even more Pain Location: perineum with cleansing Pain Descriptors / Indicators: Sore Pain Intervention(s): Other (comment) (barrier cream applied)    Home Living Family/patient  expects to be discharged to:: Skilled nursing facility Living Arrangements: Alone                    Prior Function Level of Independence: Independent with assistive device(s);Needs assistance   Gait / Transfers Assistance Needed: Per pt's sister, pt was mod I at w/c level   ADL's / Homemaking Assistance Needed: Per pt's sister, pt was mod I with ADLs, but required asist with IADLs.    Comments: Pt uses public transportation.  Had aide 3 hours/day who assisted with meals and household management      Hand Dominance   Dominant Hand: Right    Extremity/Trunk Assessment   Upper Extremity Assessment: Defer to OT evaluation           Lower Extremity Assessment: RLE deficits/detail;LLE deficits/detail RLE Deficits / Details: AROM WFL, strength at least 3/5, nor formally tested LLE Deficits / Details: AROM WFL, strength at least 3/5, nor formally tested     Communication   Communication: Receptive difficulties;Expressive difficulties  Cognition Arousal/Alertness: Awake/alert Behavior During Therapy: WFL for tasks assessed/performed Overall Cognitive Status: Impaired/Different from baseline Area of Impairment: Following commands;Attention   Current Attention Level: Focused   Following Commands: Follows one step commands inconsistently;Follows one step commands with increased  time       General Comments: needed multimodal cues for following commands, laughs inappropriately in response to questions, answers with inapporpriate 2-3 word jargon responses    General Comments General comments (skin integrity, edema, etc.): HR 118-159 a-fib, RN aware and deferred EOB activity; Sister arrived in room during evaluation and assisted with info about PLOF    Exercises        Assessment/Plan    PT Assessment Patient needs continued PT services  PT Diagnosis Generalized weakness   PT Problem List Decreased strength;Decreased cognition;Decreased activity tolerance;Decreased  balance;Decreased mobility;Decreased safety awareness  PT Treatment Interventions DME instruction;Balance training;Functional mobility training;Patient/family education;Therapeutic exercise;Therapeutic activities;Wheelchair mobility training   PT Goals (Current goals can be found in the Care Plan section) Acute Rehab PT Goals Patient Stated Goal: sister is hopeful that he will be able to regain independence  PT Goal Formulation: With family Time For Goal Achievement: 08/25/15 Potential to Achieve Goals: Fair    Frequency Min 3X/week   Barriers to discharge        Co-evaluation PT/OT/SLP Co-Evaluation/Treatment: Yes Reason for Co-Treatment: For patient/therapist safety PT goals addressed during session: Mobility/safety with mobility OT goals addressed during session: ADL's and self-care       End of Session   Activity Tolerance: Treatment limited secondary to medical complications (Comment) (elevated HR) Patient left: in bed;with family/visitor present;with bed alarm set;with call bell/phone within reach           Time: 1131-1156 PT Time Calculation (min) (ACUTE ONLY): 25 min   Charges:   PT Evaluation $PT Eval Moderate Complexity: 1 Procedure     PT G CodesReginia Naas 08/14/15, 1:15 PM  Magda Kiel, [place] 08-14-2015

## 2015-08-11 NOTE — Progress Notes (Signed)
Palliative Medicine RN: Following peripherally. Please call if needed. Larina Earthly, RN, BSN, [facility] 08/11/2015 11:15 AM Cell [phone] 8:00-4:00 Monday-Friday Office [phone]

## 2015-08-11 NOTE — Progress Notes (Signed)
Family Medicine Teaching Service Daily Progress Note Intern Pager: [phone]  Patient name: Steven [name] Medical record number: [id number] Date of birth: 1960/12/26 Age: 55 y.o. Gender: male  Primary Care Provider: Chrisandra Netters, MD Consultants: neurology, ID orthopedics Code Status: DNR  Pt Overview and Major Events to Date:  2/21 - admitted to [facility]; one dose of zosyn; started CTX 2/22 - started vanc, amp, acyclovir; acyclovir and amp d/ced; heparin gtt started 2/25 - Ativan taper started  2/27 - scheduled Ativan d/ced; patient more alert; heparin gtt d/ced, Lovenox started  Assessment and Plan: Steven [name] is a 55 y.o. male presenting with altered mental status. PMH is significant for DM, HTN, atrial fibrillation, polysubstance abuse (cocaine, tobacco, alcohol), h/o DVT, PVD, R AKA, L BKA.   Acute encephalopathy: Primary insults include neuroglycopenia, seizure, cocaine effects, and possible stump infection. UDS + cocaine. Imaging negative. Urine and blood cultures with no growth (final). Seen by ID 2/27, who again feel that meningoencephalitis is unlikely cause of AMS, but recommend completing two week course of vanc and CTX (last dose 08/18/15), which would also cover a possible stump infection. . Now with concern for aphasia, as patient is alert and seems improved but not responding to questions.  - Neurology signed off - ID signed off - Continue thiamine, folate - Continue vancomycin and rocephin (2/21 >> ) to cover possible meningoencephalitis and stump infection (last dose 08/18/15) - Palliative care consulted>>> attempt pureed diet with honey thick liquids (sister understands risk of aspiration), full scope tx with DNR/DNI   Multiple pulmonary emboli: CTA 2/22: nonobstructive emboli in LLL pulm a. branches. Hemodynamically stable without hypoxemia. Recurrent clot in setting of nonadherence to xarelto regimen. Heparin gtt stopped 2/27 and Lovenox started.  - Continue  therapeutic dose Lovenox - Monitor pulse ox  Hypoglycemia: Resolved. IDDM, last A1C 11.2 on 04/14/15.  - Holding diabetic home medications (Lantus 45U qHS, Vovolog 10U qAC); will restart as necessary with diet  - SSI initiated with pureed diet  - d/c IVF as patient is now eating   Hypokalemia, Resolved: Patient stable at 3.6 on 2/28 s/p Kdur and K supplementation in fluids. Patient now tolerating diet.    HTN: Home meds: Lisinopril 20mg . Remains HTN. -will restart home lisinopril   Atrial Fibrillation:Outpatient medications: diltiazem 120mg , Xarelto 20mg , ASA 81mg . Required Metoprolol IV prn HR > 110bpm yesterday. Doubt cocaine is still in his system to cause unopposed alpha antagonism. HR slightly elevated when agitated, but mostly WNL overnight.  - will restart diltiazem  - Anticoagulation with therapeutic dose Lovenox   Alcohol abuse/illicit drug use and tobacco use: UDS pos for cocaine, neg serum EtOH. Discontinued scheduled Ativan yesterday in hopes of improving mental status - Continue CIWA (scores 11, 9, 0 (after Haldol) overnight) - Continue to monitor for agitation. May give PRN Ativan if necessary - Tramadol discontinued  Stump ulcer: s/p R AKA (performed in 11/16 by Dr. Sharol Given). MRI unobtainable due to motion artifact. Plain film with deep soft tissue air. Area not clinically appearing infected. Per ortho (Dr. Sharol Given), stump infection is unlikely cause of AMS. Per ID, continue two week course of vanc and CTX for coverage of possible stump infection and possible meningitis.  - Per ortho, continue Santyl dressings. Current abx sufficient to cover possible infection.  - ID consulted, signed off  - Cont vanc, CTX  FEN/GI: Pureed Diet, SLIV  Prophylaxis: therapeutic dose Lovenox for PE  Disposition: likely SNF placement   Subjective:  Patient very agitated  over night, and continually tried to get out of bed. Patient actually fell out of bed, but did not hit his head and had no  change in neurological status. He received one dose of Haldol, and his agitation improved.  Patient slightly more alert this AM, opening his eyes to his name more frequently than on my prior exams.   Objective: Temp:  [98 F (36.7 C)-99 F (37.2 C)] 99 F (37.2 C) (03/01 0611) Pulse Rate:  [92-138] 138 (03/01 0611) Resp:  [18-20] 18 (03/01 0611) BP: (144-177)/(76-122) 144/76 mmHg (03/01 0611) SpO2:  [94 %-99 %] 99 % (03/01 [id number]) Physical Exam: General: lying in bed resting comfortably, in NAD Cardiovascular: RRR, no murmurs appreciated Respiratory: CTAB, normal work of breathing on RA Abdomen: soft, non-tender, non-distended, +BS Neuro: opened eyes to name repeatedly, though could not follow commands or answer questions  Laboratory:  Recent Labs Lab 08/09/15 1310 08/10/15 0836 08/11/15 0642  WBC 4.9 5.3 5.8  HGB 14.3 14.6 14.1  HCT 43.7 44.0 43.9  PLT 213 214 249    Recent Labs Lab 08/05/15 0855 08/06/15 0619  08/09/15 1310 08/09/15 1315 08/10/15 0836  NA 139 139  < > 140 139 141  K 3.6 3.5  < > 3.2* 3.2* 3.6  CL 105 110  < > 109 109 112*  CO2 25 22  < > 20* 19* 22  BUN <5* <5*  < > <5* <5* <5*  CREATININE 0.90 0.92  < > 0.86 0.85 0.85  CALCIUM 8.8* 8.8*  < > 8.9 8.9 9.1  PROT 5.8* 6.2*  --   --   --   --   BILITOT 0.3 0.3  --   --   --   --   ALKPHOS 60 62  --   --   --   --   ALT 17 15*  --   --   --   --   AST 27 20  --   --   --   --   GLUCOSE 179* 202*  < > 177* 176* 130*  < > = values in this interval not displayed.  Imaging/Diagnostic Tests: Ct Head Wo Contrast 08/03/2015 IMPRESSION: No acute intracranial pathology.   Ct Angio Chest Pe W/cm &/or Wo Cm 08/04/2015  ADDENDUM REPORT: 08/04/2015 07:44 ADDENDUM: Critical Value/emergent results were called by telephone at the time of interpretation on 08/04/2015 at 7:35 AM to Dr. Junie Panning , who verbally acknowledged these results.   08/04/2015 IMPRESSION: Small pulmonary emboli in branches of the left  lower lobe pulmonary artery. These small pulmonary emboli are nonobstructing. No more central pulmonary embolus. No demonstrable right heart strain. Prominence of the ascending thoracic aorta with a measured diameter 4.2 x 4.1 cm. Recommend annual imaging followup by CTA or MRA. This recommendation follows 2010 ACCF/AHA/AATS/ACR/ASA/SCA/SCAI/SIR/STS/SVM Guidelines for the Diagnosis and Management of Patients with Thoracic Aortic Disease. Circulation. 2010; 121: [id number] 8 mm nodular opacity left upper lobe. Followup of this nodular opacity should be based on Fleischner Society guidelines. If the patient is at high risk for bronchogenic carcinoma, follow-up chest CT at 3-96months is recommended. If the patient is at low risk for bronchogenic carcinoma, follow-up chest CT at 6-12 months is recommended. This recommendation follows the consensus statement: Guidelines for Management of Small Pulmonary Nodules Detected on CT Scans: A Statement from the [employer] as published in Radiology 2005; 237:395-400. No frank edema or consolidation. Patchy atelectasis bilaterally. No demonstrable adenopathy.   Mr Jeri Cos Wo  Contrast 08/03/2015 IMPRESSION: 1. Motion degraded study. 2. No acute intracranial process identified. 3. 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 4. Mild chronic small vessel ischemic disease.   Dg Chest Port 1 View 08/03/2015 IMPRESSION: No active disease.    Nicolette Bang, DO 08/11/2015, 8:34 AM PGY-1, [facility] Intern pager: [phone], text pages welcome

## 2015-08-11 NOTE — NC FL2 (Signed)
[place] LEVEL OF CARE SCREENING TOOL     IDENTIFICATION  Patient Name: Steven [name] Birthdate: [birth year] Sex: male Admission Date (Current Location): 08/03/2015  [facility] and [place] Number:  Herbalist and Address:  [facility], [place] [address], [place], [place] [address]      Provider Number: [id number]  Attending Physician Name and Address:  Leeanne Rio, MD  Relative Name and Phone Number:  Jeannene Patella, sister, [phone]    Current Level of Care: SNF Recommended Level of Care: [place] Prior Approval Number:    Date Approved/Denied:   PASRR Number: [id number]  Discharge Plan: SNF    Current Diagnoses: Patient Active Problem List   Diagnosis Date Noted  . Palliative care encounter   . Goals of care, counseling/discussion   . Dysphagia   . DNR (do not resuscitate)   . Stump pain ([place])   . Lung nodule seen on imaging study 08/04/2015  . Seizure ([place])   . Tachycardia   . FUO (fever of unknown origin)   . Meningoencephalitis   . Polysubstance abuse   . Hypoglycemia   . Altered mental status 08/03/2015  . Patient cannot afford medications 06/04/2015  . Paroxysmal a-fib ([place])   . Adjustment disorder with depressed mood   . History of left below knee amputation ([place]) 04/22/2015  . Unilateral complete AKA ([place]) 04/19/2015  . Dry gangrene ([place])   . Right leg pain 04/14/2015  . Cough 08/12/2014  . Bilateral hand pain 05/29/2014  . Acne 03/30/2014  . S/P bilateral BKA (below knee amputation) ([place]) 01/22/2014  . Cocaine abuse 12/05/2013  . Tobacco abuse 12/04/2013  . Peripheral vascular disease ([place])   . Atrial fibrillation ([place]) 12/03/2013  . 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 History of colonic polyps 05/29/2013  . Diabetic retinopathy ([place]) 12/25/2012  . HLD (hyperlipidemia) 12/08/2012  . Peripheral neuropathy ([place]) 11/19/2011  . ETOH abuse 03/29/2011  . HTN (hypertension) 12/22/2010   . Diabetes mellitus type II, uncontrolled ([place]) 07/18/2006  . VENOUS INSUFFICIENCY 07/18/2006    Orientation RESPIRATION BLADDER Height & Weight     Self  Normal Incontinent, External catheter (Condom catheter) Weight: 181 lb 3.5 oz (82.2 kg) Height:  6\' 4"  (193 cm)  BEHAVIORAL SYMPTOMS/MOOD NEUROLOGICAL BOWEL NUTRITION STATUS      Incontinent Diet (Please see DC summary)  AMBULATORY STATUS COMMUNICATION OF NEEDS Skin   Extensive Assist Verbally Surgical wounds (Wound on leg)                       Personal Care Assistance Level of Assistance  Bathing, Feeding, Dressing Bathing Assistance: Maximum assistance Feeding assistance: Maximum assistance Dressing Assistance: Maximum assistance     Functional Limitations Info             SPECIAL CARE FACTORS FREQUENCY                       Contractures      Additional Factors Info  Code Status, Allergies, Insulin Sliding Scale Code Status Info: DNR Allergies Info: NKA   Insulin Sliding Scale Info: insulin aspart (novoLOG) injection 0-9 Units 3 times daily with meals       Current Medications (08/11/2015):  This is the current hospital active medication list Current Facility-Administered Medications  Medication Dose Route Frequency Provider Last Rate Last Dose  . acetaminophen (TYLENOL) suppository 325 mg  325 mg Rectal Q4H PRN Burna Cash  Rumley, DO   325 mg at 08/08/15 1134  . antiseptic oral rinse (CPC / CETYLPYRIDINIUM CHLORIDE 0.05%) solution 7 mL  7 mL Mouth Rinse q12n4p Leeanne Rio, MD   7 mL at 08/11/15 1200  . cefTRIAXone (ROCEPHIN) 2 g in dextrose 5 % 50 mL IVPB  2 g Intravenous Q24H Leeanne Rio, MD   2 g at 08/11/15 1017  . chlorhexidine (PERIDEX) 0.12 % solution 15 mL  15 mL Mouth Rinse BID Leeanne Rio, MD   15 mL at 08/11/15 1018  . collagenase (SANTYL) ointment   Topical Daily Meridee Score V, MD      . dextrose 5 % and 0.45% NaCl 1,000 mL with potassium chloride 20 mEq infusion    Intravenous Continuous Patrecia Pour, MD 100 mL/hr at 08/11/15 0745    . enoxaparin (LOVENOX) injection 80 mg  80 mg Subcutaneous Q12H Leeanne Rio, MD   80 mg at 08/11/15 0440  . folic acid injection 1 mg  1 mg Intravenous Daily [place] N Rumley, DO   1 mg at 08/11/15 1018  . hydrALAZINE (APRESOLINE) injection 10 mg  10 mg Intravenous Q4H PRN Smiley Houseman, MD   10 mg at 08/08/15 1144  . hydrALAZINE (APRESOLINE) injection 5 mg  5 mg Intravenous Q6H Patrecia Pour, MD   5 mg at 08/11/15 0706  . insulin aspart (novoLOG) injection 0-9 Units  0-9 Units Subcutaneous TID WC Melton Alar, PA-C   2 Units at 08/11/15 0745  . LORazepam (ATIVAN) injection 2-3 mg  2-3 mg Intravenous Q1H PRN Lorna Few, DO   2 mg at 08/09/15 2145  . metoprolol (LOPRESSOR) injection 2.5-5 mg  2.5-5 mg Intravenous Q3H PRN Patrecia Pour, MD      . sodium chloride flush (NS) 0.9 % injection 3 mL  3 mL Intravenous Q12H [place] N Rumley, DO   10 mL at 08/10/15 2202  . thiamine (B-1) injection 100 mg  100 mg Intravenous Daily [place] N Rumley, DO   100 mg at 08/11/15 1018  . vancomycin (VANCOCIN) IVPB 750 mg/150 ml premix  750 mg Intravenous Q12H Leeanne Rio, MD         Discharge Medications: Please see discharge summary for a list of discharge medications.  Relevant Imaging Results:  Relevant Lab Results:   Additional Information SSN: [place], [place]

## 2015-08-11 NOTE — Progress Notes (Addendum)
Speech Language Pathology Treatment: Dysphagia  Patient Details Name: Steven [name] MRN: [id number] DOB: 01-Feb-1961 Today's Date: 08/11/2015 Time: [id number] SLP Time Calculation (min) (ACUTE ONLY): 12 min  Assessment / Plan / Recommendation Clinical Impression  Pt partially reclined in bed and attempting to self-feed lunch tray upon SLP arrival. Dys 1 diet and honey thick liquids initiated by MD as sister voiced her desire to accept the risks of aspiration. SLP provided assistance with scooping food onto utensils. Likely delayed swallow observed, but with only one cough observed across trials of purees and thickened liquids. Recommend to continue Dys 1 diet but with nectar thick liquids. Pt will need full supervision for safety and assistance with feeding due to altered mentation.   HPI HPI: 55 y.o. male presenting with altered mental status. PMH is significant for DM, HTN, atrial fibrillation, polysubstance abuse (cocaine, tobacco, alcohol), h/o DVT, PVD, R AKA, L BKA.       SLP Plan  Continue with current plan of care     Recommendations  Diet recommendations: Dysphagia 1 (puree);Nectar-thick liquid Liquids provided via: Cup;No straw Medication Administration: Crushed with puree Supervision: Patient able to self feed;Full supervision/cueing for compensatory strategies Compensations: Minimize environmental distractions;Slow rate;Small sips/bites Postural Changes and/or Swallow Maneuvers: Seated upright 90 degrees             Oral Care Recommendations: Oral care BID Follow up Recommendations: Skilled Nursing facility;24 hour supervision/assistance Plan: Continue with current plan of care     GO               Germain Osgood, M.A. CCC-SLP [phone]  Germain Osgood 08/11/2015, 2:01 PM

## 2015-08-12 LAB — C DIFFICILE QUICK SCREEN W PCR REFLEX
C DIFFICILE (CDIFF) INTERP: NEGATIVE
C DIFFICLE (CDIFF) ANTIGEN: NEGATIVE
C Diff toxin: NEGATIVE

## 2015-08-12 LAB — GLUCOSE, CAPILLARY
GLUCOSE-CAPILLARY: 181 mg/dL — AB (ref 65–99)
GLUCOSE-CAPILLARY: 353 mg/dL — AB (ref 65–99)

## 2015-08-12 MED ORDER — HEPARIN SOD (PORK) LOCK FLUSH 100 UNIT/ML IV SOLN
250.0000 [IU] | INTRAVENOUS | Status: AC | PRN
  Administered 2015-08-12: 250 [IU]

## 2015-08-12 MED ORDER — VITAMIN B-1 100 MG PO TABS
100.0000 mg | ORAL_TABLET | Freq: Every day | ORAL | Status: DC
  Filled 2015-08-12: qty 1

## 2015-08-12 MED ORDER — INSULIN ASPART 100 UNIT/ML ~~LOC~~ SOLN: 0.0000 [IU] | Freq: Three times a day (TID) | SUBCUTANEOUS | Status: DC

## 2015-08-12 MED ORDER — HEPARIN SOD (PORK) LOCK FLUSH 100 UNIT/ML IV SOLN: 250.0000 [IU] | INTRAVENOUS | Status: DC | PRN

## 2015-08-12 MED ORDER — HYDROCHLOROTHIAZIDE 12.5 MG PO CAPS: 12.5000 mg | ORAL_CAPSULE | Freq: Two times a day (BID) | ORAL | Status: AC

## 2015-08-12 MED ORDER — FOLIC ACID 1 MG PO TABS
1.0000 mg | ORAL_TABLET | Freq: Every day | ORAL | Status: DC
  Filled 2015-08-12: qty 1

## 2015-08-12 MED ORDER — DILTIAZEM HCL ER COATED BEADS 180 MG PO CP24: 180.0000 mg | ORAL_CAPSULE | Freq: Every day | ORAL | Status: DC

## 2015-08-12 MED ORDER — VANCOMYCIN HCL IN DEXTROSE 750-5 MG/150ML-% IV SOLN: 750.0000 mg | Freq: Two times a day (BID) | INTRAVENOUS | Status: AC

## 2015-08-12 MED ORDER — PROBIOTIC ACIDOPHILUS BIOBEADS PO CAPS: 1.0000 | ORAL_CAPSULE | Freq: Every day | ORAL | Status: AC

## 2015-08-12 MED ORDER — DILTIAZEM HCL ER COATED BEADS 180 MG PO CP24: 180.0000 mg | ORAL_CAPSULE | Freq: Every day | ORAL | Status: AC

## 2015-08-12 MED ORDER — HYDROCHLOROTHIAZIDE 12.5 MG PO CAPS
12.5000 mg | ORAL_CAPSULE | Freq: Two times a day (BID) | ORAL | Status: DC
  Filled 2015-08-12: qty 1

## 2015-08-12 MED ORDER — DILTIAZEM HCL ER COATED BEADS 180 MG PO CP24
180.0000 mg | ORAL_CAPSULE | Freq: Every day | ORAL | Status: DC
  Filled 2015-08-12: qty 1

## 2015-08-12 MED ORDER — ENOXAPARIN SODIUM 80 MG/0.8ML ~~LOC~~ SOLN: 80.0000 mg | Freq: Two times a day (BID) | SUBCUTANEOUS | Status: AC

## 2015-08-12 MED ORDER — DEXTROSE 5 % IV SOLN: 2.0000 g | INTRAVENOUS | Status: AC

## 2015-08-12 MED ORDER — INSULIN GLARGINE 100 UNIT/ML ~~LOC~~ SOLN: SUBCUTANEOUS | Status: DC

## 2015-08-12 NOTE — Progress Notes (Signed)
Patient will DC to: [facility] Anticipated DC date: 08/12/15 Family notified: Sister Transport by: PTAR 4:30pm  CSW signing off.  Cedric Fishman, [place] Social Worker [phone]

## 2015-08-12 NOTE — Progress Notes (Addendum)
Pt becoming more agitated and somewhat combative. Pt is refusing to allow RN to reconnect line and restart IV fluids. Pt is refusing IV team from drawing morning labs. Pt is also refusing to wear telemetry leads. Still awaiting MD orders at this time.

## 2015-08-12 NOTE — Progress Notes (Signed)
Pt is still agitated and somewhat combative. RN able to reconnect and restart IV fluids. Still awaiting orders from MD. Will continue to monitor.

## 2015-08-12 NOTE — Progress Notes (Signed)
ANTICOAGULATION CONSULT NOTE - Follow Up Consult  Pharmacy Consult for Heparin to Lovenox Indication: new PEs, hx afib (Xarelto on hold)  No Known Allergies  Patient Measurements: Height: 6\' 4"  (193 cm) Weight: 181 lb 3.5 oz (82.2 kg) IBW/kg (Calculated) : 86.8  Vital Signs: Temp: 97.5 F (36.4 C) (03/02 0525) Temp Source: Oral (03/02 0525) BP: 172/97 mmHg (03/02 0525) Pulse Rate: 83 (03/02 0525)  Labs:  Recent Labs  08/09/15 1310 08/09/15 1315 08/10/15 0836 08/11/15 0642  HGB 14.3  --  14.6 14.1  HCT 43.7  --  44.0 43.9  PLT 213  --  214 249  CREATININE 0.86 0.85 0.85  --     Estimated Creatinine Clearance: 112.8 mL/min (by C-G formula based on Cr of 0.85).   Assessment: 55 yo male admitted 2/21 with sepsis, AMS, unresponsive.   Found to have multiple PEs  Continues on Lovenox CBC stable  Goal of Therapy:  Monitor platelets by anticoagulation protocol: Yes   Plan:  DC Heparin  Lovenox 80 mg sq Q 12 hours  Can we resume Eldridge Abrahams?  Thank you Anette Guarneri, PharmD [phone]  08/12/2015 11:04 AM

## 2015-08-12 NOTE — Progress Notes (Addendum)
Dr. Ola Spurr made aware patient has had 4 loose BMs today and concern if patient needs to be to be tested for CDIFF. Also, MD made aware that patient is refusing to take daily PO medication including his BP and HR medications. Tried for over 15 minutes to give medications with patient's sister at bedside. Patient still refusing. Patient nodding saying "I ain't going to do that". MD stated she will address with the team.   1215: Patient had another loose, watery, yellow incontinent bowel movement. Patient still has not taken medications. MD made aware. Orders placed.

## 2015-08-12 NOTE — Clinical Social Work Placement (Signed)
   CLINICAL SOCIAL WORK PLACEMENT  NOTE  Date:  08/12/2015  Patient Details  Name: Steven [name] MRN: [id number] Date of Birth: 09-05-1960  Clinical Social Work is seeking post-discharge placement for this patient at the [place] level of care (*CSW will initial, date and re-position this form in  chart as items are completed):  Yes   Patient/family provided with [place] Work Department's list of facilities offering this level of care within the geographic area requested by the patient (or if unable, by the patient's family).  Yes   Patient/family informed of their freedom to choose among providers that offer the needed level of care, that participate in Medicare, Medicaid or managed care program needed by the patient, have an available bed and are willing to accept the patient.  Yes   Patient/family informed of [name]'s ownership interest in [facility] and [facility], as well as of the fact that they are under no obligation to receive care at these facilities.  PASRR submitted to EDS on 08/11/15     PASRR number received on 08/11/15     Existing PASRR number confirmed on       FL2 transmitted to all facilities in geographic area requested by pt/family on 08/11/15     FL2 transmitted to all facilities within larger geographic area on       Patient informed that his/her managed care company has contracts with or will negotiate with certain facilities, including the following:        Yes   Patient/family informed of bed offers received.  Patient chooses bed at [facility]     Physician recommends and patient chooses bed at      Patient to be transferred to [facility] on 08/12/15.  Patient to be transferred to facility by PTAR     Patient family notified on 08/12/15 of transfer.  Name of family member notified:  Sister     PHYSICIAN       Additional Comment:     _______________________________________________ Benard Halsted, [place] 08/12/2015, 4:59 PM

## 2015-08-12 NOTE — Progress Notes (Signed)
I was the MD over night. Never received a page concerning Mr. Bonello. Around 5am I received 1 page that had 6 numbers. Attempted to take out numbers to call the number back, this was unsuccessful. No additional pages were made to any of my 3 pagers (either wrong numbers or pages concerning Mr. Essa). Spoke to daytime RN about protocol for paging- if no response, RN should page MD again in 15 minutes, especially if there is an ongoing issue. Patient doing well now, not currently agitated. Very nice per RN.  Archie Patten, MD [facility] Resident  08/12/2015, 8:51 AM

## 2015-08-12 NOTE — Progress Notes (Signed)
Pt refusing to be cleaned up at this time and becoming agitated. Awaiting orders from MD.

## 2015-08-12 NOTE — Clinical Social Work Note (Signed)
Clinical Social Work Assessment  Patient Details  Name: Steven [name] MRN: [id number] Date of Birth: [birth year]  Date of referral:  08/12/15               Reason for consult:  Facility Placement                Permission sought to share information with:  Facility Sport and exercise psychologist, Family Supports Permission granted to share information::  No (Patient disoriented. Completed assessment w/sister)  Name::     Pam  Agency::  [facility] SNFs  Relationship::  sister  Contact Information:  [phone]  Housing/Transportation Living arrangements for the past 2 months:  [place] of Information:  Other (Comment Required) (Sister) Patient Interpreter Needed:  None Criminal Activity/Legal Involvement Pertinent to Current Situation/Hospitalization:  No - Comment as needed Significant Relationships:  Siblings Lives with:  Self Do you feel safe going back to the place where you live?  No Need for family participation in patient care:  Yes (Comment)  Care giving concerns:  CSW received referral for possible SNF placement at time of discharge. CSW met with patient (disoriented) and patient's sister at bedside regarding PT recommendation of SNF placement at time of discharge. Per patient's sister, patient is currently unable to care for patient at his home given patient's current physical needs and fall risk. Patient's sister expressed understanding of PT recommendation and is agreeable to SNF placement at time of discharge. Patient's sister is also hoping to get patient into long term care as well. CSW to continue to follow and assist with discharge planning needs.   Social Worker assessment / plan:  CSW spoke with patient and patient's sister concerning possibility of rehab at [facility] before returning home.  Employment status:  Disabled (Comment on whether or not currently receiving Disability) Insurance information:  Medicaid In [place] PT Recommendations:  [place] / Referral to community resources:  [place]  Patient/Family's Response to care:  Patient's sister recognizes need for rehab before returning home and is agreeable to a SNF in [place].  Patient/Family's Understanding of and Emotional Response to Diagnosis, Current Treatment, and Prognosis: No questions/concerns about plan or treatment.    Emotional Assessment Appearance:  Appears stated age Attitude/Demeanor/Rapport:   (Appropriate) Affect (typically observed):  Unable to Assess (Patient disoriented) Orientation:  Oriented to Self Alcohol / Substance use:  Illicit Drugs Psych involvement (Current and /or in the community):  No (Comment)  Discharge Needs  Concerns to be addressed:  Care Coordination Readmission within the last 30 days:  No Current discharge risk:  None Barriers to Discharge:  No Barriers Identified   Benard Halsted, [place] 08/12/2015, 3:01 PM

## 2015-08-12 NOTE — Progress Notes (Addendum)
2:22 PM MD paged to make aware CDIFF negative. Also, that SW stated they have a bed at SNF for patient and to clarify if patient's PICC can be d/c. MD verbal order to leave PICC for longer term IV abx at SNF.

## 2015-08-12 NOTE — Progress Notes (Signed)
Family Medicine Teaching Service Daily Progress Note Intern Pager: [phone]  Patient name: Steven [name] Medical record number: [id number] Date of birth: [birth year] Age: 55 y.o. Gender: male  Primary Care Provider: Chrisandra Netters, MD Consultants: neurology, ID orthopedics Code Status: DNR  Pt Overview and Major Events to Date:  2/21 - admitted to [facility]; one dose of zosyn; started CTX 2/22 - started vanc, amp, acyclovir; acyclovir and amp d/ced; heparin gtt started 2/25 - Ativan taper started  2/27 - scheduled Ativan d/ced; patient more alert; heparin gtt d/ced, Lovenox started  Assessment and Plan: [place] RENY is a 55 y.o. male presenting with altered mental status. PMH is significant for DM, HTN, atrial fibrillation, polysubstance abuse (cocaine, tobacco, alcohol), h/o DVT, PVD, R AKA, L BKA.   Acute encephalopathy: Primary insults include neuroglycopenia, seizure, cocaine effects, and possible stump infection. UDS + cocaine. Imaging negative. Urine and blood cultures with no growth (final). Seen by ID 2/27, who again feel that meningoencephalitis is unlikely cause of AMS, but recommend completing two week course of vanc and CTX (last dose 08/18/15), which would also cover a possible stump infection. .  - Neurology signed off - ID signed off - Continue thiamine, folate - Continue vancomycin and rocephin (2/21 >> ) to cover possible meningoencephalitis and stump infection (last dose 08/18/15) - Palliative care consulted>>> attempt diet (sister understands risk of aspiration), full scope tx with DNR/DNI  -SLP ordered dysphagia 1 diet   Multiple pulmonary emboli: CTA 2/22: nonobstructive emboli in LLL pulm a. branches. Hemodynamically stable without hypoxemia. Recurrent clot in setting of nonadherence to xarelto regimen. Heparin gtt stopped 2/27 and Lovenox started.  - Continue therapeutic dose Lovenox - Monitor pulse ox  Hypoglycemia: Resolved. IDDM, last A1C 11.2 on 04/14/15.  -  Holding diabetic home medications (Lantus 45U qHS, Vovolog 10U qAC); will restart as necessary with diet  - SSI initiated with pureed diet  - d/c IVF as patient is now eating   Hypokalemia, Resolved: Patient stable at 3.6 on 2/28 s/p Kdur and K supplementation in fluids. Patient now tolerating diet.    HTN: Home meds: Lisinopril 20mg . Remains HTN. -will restart home lisinopril   Atrial Fibrillation:Outpatient medications: diltiazem 120mg , Xarelto 20mg , ASA 81mg . Required Metoprolol IV prn HR > 110bpm yesterday. Doubt cocaine is still in his system to cause unopposed alpha antagonism. HR slightly elevated when agitated, but mostly WNL overnight.  - will restart diltiazem  - Anticoagulation with therapeutic dose Lovenox   Alcohol abuse/illicit drug use and tobacco use: UDS pos for cocaine, neg serum EtOH. - Continue to monitor for agitation. May give PRN Ativan if necessary - Tramadol discontinued   Stump ulcer: s/p R AKA (performed in 11/16 by Dr. Sharol Given). MRI unobtainable due to motion artifact. Plain film with deep soft tissue air. Area not clinically appearing infected. Per ortho (Dr. Sharol Given), stump infection is unlikely cause of AMS. Per ID, continue two week course of vanc and CTX for coverage of possible stump infection and possible meningitis.  - Per ortho, continue Santyl dressings. Current abx sufficient to cover possible infection.  - ID consulted, signed off  - Cont vanc, CTX  FEN/GI: Dysphagia I Diet, SLIV  Prophylaxis: therapeutic dose Lovenox for PE  Disposition: likely SNF placement   Subjective:  Per nursing, patient agitated overnight. Now calm.   Objective: Temp:  [97.5 F (36.4 C)-99.4 F (37.4 C)] 97.5 F (36.4 C) (03/02 0525) Pulse Rate:  [83-99] 83 (03/02 0525) Resp:  [18-24] 18 (03/02  0525) BP: (139-172)/(71-97) 172/97 mmHg (03/02 0525) SpO2:  [91 %-100 %] 91 % (03/02 0525) Physical Exam: General: lying in bed resting comfortably, in NAD Cardiovascular:  RRR, no murmurs appreciated Respiratory: CTAB, normal work of breathing on RA Abdomen: soft, non-tender, non-distended, +BS Neuro: alert and oriented to person. Thinks it is 55 and he is [place]. This is an improvement in what he has been able to verbalize previously.   Laboratory:  Recent Labs Lab 08/09/15 1310 08/10/15 0836 08/11/15 0642  WBC 4.9 5.3 5.8  HGB 14.3 14.6 14.1  HCT 43.7 44.0 43.9  PLT 213 214 249    Recent Labs Lab 08/05/15 0855 08/06/15 0619  08/09/15 1310 08/09/15 1315 08/10/15 0836  NA 139 139  < > 140 139 141  K 3.6 3.5  < > 3.2* 3.2* 3.6  CL 105 110  < > 109 109 112*  CO2 25 22  < > 20* 19* 22  BUN <5* <5*  < > <5* <5* <5*  CREATININE 0.90 0.92  < > 0.86 0.85 0.85  CALCIUM 8.8* 8.8*  < > 8.9 8.9 9.1  PROT 5.8* 6.2*  --   --   --   --   BILITOT 0.3 0.3  --   --   --   --   ALKPHOS 60 62  --   --   --   --   ALT 17 15*  --   --   --   --   AST 27 20  --   --   --   --   GLUCOSE 179* 202*  < > 177* 176* 130*  < > = values in this interval not displayed.  Imaging/Diagnostic Tests: Ct Head Wo Contrast 08/03/2015 IMPRESSION: No acute intracranial pathology.   Ct Angio Chest Pe W/cm &/or Wo Cm 08/04/2015  ADDENDUM REPORT: 08/04/2015 07:44 ADDENDUM: Critical Value/emergent results were called by telephone at the time of interpretation on 08/04/2015 at 7:35 AM to Dr. Junie Panning , who verbally acknowledged these results.   08/04/2015 IMPRESSION: Small pulmonary emboli in branches of the left lower lobe pulmonary artery. These small pulmonary emboli are nonobstructing. No more central pulmonary embolus. No demonstrable right heart strain. Prominence of the ascending thoracic aorta with a measured diameter 4.2 x 4.1 cm. Recommend annual imaging followup by CTA or MRA. This recommendation follows 2010 ACCF/AHA/AATS/ACR/ASA/SCA/SCAI/SIR/STS/SVM Guidelines for the Diagnosis and Management of Patients with Thoracic Aortic Disease. Circulation. 2010; 121:  e266-e369 8 mm nodular opacity left upper lobe. Followup of this nodular opacity should be based on Fleischner Society guidelines. If the patient is at high risk for bronchogenic carcinoma, follow-up chest CT at 3-54months is recommended. If the patient is at low risk for bronchogenic carcinoma, follow-up chest CT at 6-12 months is recommended. This recommendation follows the consensus statement: Guidelines for Management of Small Pulmonary Nodules Detected on CT Scans: A Statement from the [place] as published in Radiology 2005; 237:395-400. No frank edema or consolidation. Patchy atelectasis bilaterally. No demonstrable adenopathy.   Mr Jeri Cos Wo Contrast 08/03/2015 IMPRESSION: 1. Motion degraded study. 2. No acute intracranial process identified. 3. 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 4. Mild chronic small vessel ischemic disease.   Dg Chest Port 1 View 08/03/2015 IMPRESSION: No active disease.    Nicolette Bang, DO 08/12/2015, 8:47 AM PGY-1, [place] Intern pager: [phone], text pages welcome

## 2015-08-12 NOTE — Progress Notes (Signed)
Speech Language Pathology Treatment: Dysphagia  Patient Details Name: Steven [name] MRN: [id number] DOB: 01-23-1961 Today's Date: 08/12/2015 Time: 1340-1400 SLP Time Calculation (min) (ACUTE ONLY): 20 min  Assessment / Plan / Recommendation Clinical Impression  SLP asked to see pt; RN feels pt has improved for possible diet upgrade prior to d/c. Pt seen with intake of 8 oz of thin liquids via straw. Pts intake automatic with large consecutive swallows, no signs of aspiration. Pt also able to masticate graham cracker appropriately with gums with no cueing needed. Recommend upgrade to dys 3 mechanical soft with thin liquids with continued supervision given poorly sustained attention for meal completion. No SLP f/u needed acutely as swallow function is otherwise WNL.    HPI HPI: 55 y.o. male presenting with altered mental status. PMH is significant for DM, HTN, atrial fibrillation, polysubstance abuse (cocaine, tobacco, alcohol), h/o DVT, PVD, R AKA, L BKA.       SLP Plan  All goals met     Recommendations  Diet recommendations: Dysphagia 3 (mechanical soft);Thin liquid Liquids provided via: Cup;Straw Medication Administration: Crushed with puree Supervision: Patient able to self feed;Full supervision/cueing for compensatory strategies Compensations: Minimize environmental distractions;Slow rate;Small sips/bites Postural Changes and/or Swallow Maneuvers: Seated upright 90 degrees             Oral Care Recommendations: Oral care BID Follow up Recommendations: Skilled Nursing facility;24 hour supervision/assistance Plan: All goals met     GO               Steven Baltimore, MA CCC-SLP [phone]  Lynann Beaver 08/12/2015, 2:00 PM

## 2015-08-12 NOTE — Progress Notes (Addendum)
During report RN stated she paged MD about patient's agitation and unable to give Lovenox. Per day RN assessment patient is calm and following commands. RN contacted pharmacy to retime lovenox and give late dose now. MD aware of plan.

## 2015-08-12 NOTE — Progress Notes (Addendum)
[facility] to be D/C'd Nursing Home per MD order.  Discussed with the patient and all questions fully answered.  VSS. 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  IV catheter discontinued intact. Site without signs and symptoms of complications. Dressing and pressure applied.  Escorted via EMS to [facility], Refoel Palladino C 08/12/2015 5:14 PM

## 2015-08-12 NOTE — Progress Notes (Signed)
Report given to [facility] at [facility]

## 2015-08-13 ENCOUNTER — Encounter (HOSPITAL_COMMUNITY): Payer: Self-pay | Admitting: Emergency Medicine

## 2015-08-13 ENCOUNTER — Emergency Department (HOSPITAL_COMMUNITY)
Admission: EM | Admit: 2015-08-13 | Discharge: 2015-08-14 | Disposition: A | Discharge status: Home / Self Care | Payer: Medicaid Other | Attending: Emergency Medicine | Admitting: Emergency Medicine

## 2015-08-13 ENCOUNTER — Ambulatory Visit (HOSPITAL_COMMUNITY)
Admission: RE | Admit: 2015-08-13 | Discharge: 2015-08-13 | Disposition: A | Discharge status: Home / Self Care | Payer: Medicaid Other | Source: Ambulatory Visit | Attending: Adult Health | Admitting: Adult Health

## 2015-08-13 ENCOUNTER — Ambulatory Visit (HOSPITAL_COMMUNITY): Payer: Medicaid Other

## 2015-08-13 ENCOUNTER — Other Ambulatory Visit: Payer: Self-pay | Admitting: Adult Health

## 2015-08-13 ENCOUNTER — Other Ambulatory Visit: Payer: Self-pay

## 2015-08-13 DIAGNOSIS — F17210 Nicotine dependence, cigarettes, uncomplicated: Secondary | ICD-10-CM | POA: Diagnosis not present

## 2015-08-13 DIAGNOSIS — I10 Essential (primary) hypertension: Secondary | ICD-10-CM | POA: Diagnosis not present

## 2015-08-13 DIAGNOSIS — F0390 Unspecified dementia without behavioral disturbance: Secondary | ICD-10-CM | POA: Insufficient documentation

## 2015-08-13 DIAGNOSIS — Z7982 Long term (current) use of aspirin: Secondary | ICD-10-CM | POA: Insufficient documentation

## 2015-08-13 DIAGNOSIS — Z794 Long term (current) use of insulin: Secondary | ICD-10-CM | POA: Diagnosis not present

## 2015-08-13 DIAGNOSIS — Z86718 Personal history of other venous thrombosis and embolism: Secondary | ICD-10-CM | POA: Insufficient documentation

## 2015-08-13 DIAGNOSIS — Z79899 Other long term (current) drug therapy: Secondary | ICD-10-CM | POA: Diagnosis not present

## 2015-08-13 DIAGNOSIS — B999 Unspecified infectious disease: Secondary | ICD-10-CM | POA: Diagnosis not present

## 2015-08-13 DIAGNOSIS — E119 Type 2 diabetes mellitus without complications: Secondary | ICD-10-CM | POA: Insufficient documentation

## 2015-08-13 DIAGNOSIS — I4891 Unspecified atrial fibrillation: Secondary | ICD-10-CM | POA: Diagnosis not present

## 2015-08-13 DIAGNOSIS — Z86010 Personal history of colonic polyps: Secondary | ICD-10-CM | POA: Diagnosis not present

## 2015-08-13 DIAGNOSIS — Z452 Encounter for adjustment and management of vascular access device: Secondary | ICD-10-CM | POA: Insufficient documentation

## 2015-08-13 DIAGNOSIS — Z8669 Personal history of other diseases of the nervous system and sense organs: Secondary | ICD-10-CM | POA: Insufficient documentation

## 2015-08-13 DIAGNOSIS — Z7901 Long term (current) use of anticoagulants: Secondary | ICD-10-CM | POA: Insufficient documentation

## 2015-08-13 DIAGNOSIS — Z95828 Presence of other vascular implants and grafts: Secondary | ICD-10-CM

## 2015-08-13 MED ORDER — IOHEXOL 300 MG/ML  SOLN
50.0000 mL | Freq: Once | INTRAMUSCULAR | Status: AC | PRN
  Administered 2015-08-13: 5 mL via INTRAVENOUS

## 2015-08-13 MED ORDER — LIDOCAINE HCL 1 % IJ SOLN
INTRAMUSCULAR | Status: DC
Start: 2015-08-13 — End: 2015-08-14
  Filled 2015-08-13: qty 20

## 2015-08-13 MED ORDER — DILTIAZEM HCL 25 MG/5ML IV SOLN
20.0000 mg | Freq: Once | INTRAVENOUS | Status: AC
  Administered 2015-08-13: 20 mg via INTRAVENOUS
  Filled 2015-08-13: qty 5

## 2015-08-13 MED ORDER — VANCOMYCIN HCL IN DEXTROSE 750-5 MG/150ML-% IV SOLN
750.0000 mg | Freq: Once | INTRAVENOUS | Status: AC
  Administered 2015-08-13: 750 mg via INTRAVENOUS
  Filled 2015-08-13: qty 150

## 2015-08-13 MED ORDER — DEXTROSE 5 % IV SOLN
2.0000 g | Freq: Once | INTRAVENOUS | Status: AC
  Administered 2015-08-13: 2 g via INTRAVENOUS
  Filled 2015-08-13: qty 2

## 2015-08-13 MED ORDER — HEPARIN SOD (PORK) LOCK FLUSH 100 UNIT/ML IV SOLN
INTRAVENOUS | Status: AC
  Filled 2015-08-13: qty 5

## 2015-08-13 NOTE — ED Notes (Signed)
Pt bending arm where IV is placed; pt instructed to keep arm straight. Pt continues to bend arm causing IV antibiotics to be administered at a slow rate

## 2015-08-13 NOTE — ED Notes (Signed)
Patient needs Picc line placed due to pulling it out at the nursing home.

## 2015-08-13 NOTE — ED Provider Notes (Signed)
CSN: [id number]     Arrival date & time 08/13/15  1914 History   First MD Initiated Contact with Patient 08/13/15 1921     Chief Complaint  Patient presents with  . Vascular Access Problem     (Consider location/radiation/quality/duration/timing/severity/associated sxs/prior Treatment) Patient is a 55 y.o. male presenting with general illness. The history is provided by the patient.  Illness Severity:  Mild Onset quality:  Sudden Duration:  1 day Timing:  Constant Progression:  Unchanged Chronicity:  New Associated symptoms: no abdominal pain, no chest pain, no congestion, no diarrhea, no fever, no headaches, no myalgias, no rash, no shortness of breath and no vomiting    55 yo M with a chief complaint of a PICC line falling out. Patient has a PICC line in place for vancomycin and Rocephin. No other known issues.  Level V caveat dementia.   Past Medical History  Diagnosis Date  . Uncontrolled diabetes mellitus ([place])   . HTN (hypertension)   . History of DVT (deep vein thrombosis)   . Personal history of diabetic foot ulcer   . Peripheral vascular disease ([place])     a. Abnl ABIs 2014.  Marland Kitchen Peripheral neuropathy ([place]) 11/19/2011  . Varicose vein     legs  . Osteomyelitis of foot, right, acute ([place]) 08/30/2012  . Personal history of colonic adenomas 05/29/2013  . History of cocaine use   . PAF (paroxysmal atrial fibrillation) ([place])     a. Dx 11/2013 in setting of sepsis/foot infection.  . Tobacco abuse   . H/O ETOH abuse   . Dysrhythmia     afib last admission  . Seasonal allergies   . Adjustment disorder with depressed mood    Past Surgical History  Procedure Laterality Date  . Amputation  04/21/2011    Procedure: AMPUTATION RAY;  Surgeon: Newt Minion, MD;  Location: [place];  Service: Orthopedics;  Laterality: Right;  Rt foot 2nd ray ampt  . I&d extremity  05/03/2011    Procedure: IRRIGATION AND DEBRIDEMENT EXTREMITY;  Surgeon: Newt Minion, MD;  Location: [place];  Service:  Orthopedics;  Laterality: Right;  Irrigation and Debridement Right Foot and Place Acell Xenograft  . Toe amputation  04/24/2012    great toe   right foot  . Amputation  04/24/2012    Procedure: AMPUTATION RAY;  Surgeon: Newt Minion, MD;  Location: [place];  Service: Orthopedics;  Laterality: Right;  Right foot 1st ray amputation  . Amputation Left 04/23/2013    Procedure: AMPUTATION RAY;  Surgeon: Newt Minion, MD;  Location: [place];  Service: Orthopedics;  Laterality: Left;  Left Foot 5th Ray Amputation  . Colonoscopy    . Amputation Left 11/21/2013    Procedure: AMPUTATION RAY;  Surgeon: Newt Minion, MD;  Location: [place];  Service: Orthopedics;  Laterality: Left;  Left Foot 1st & 2nd Ray Amputation  . Amputation Right 12/04/2013    Procedure: AMPUTATION BELOW KNEE;  Surgeon: Marianna Payment, MD;  Location: [place];  Service: Orthopedics;  Laterality: Right;  . Amputation Left 12/29/2013    Procedure: LEFT AMPUTATION BELOW KNEE;  Surgeon: Marianna Payment, MD;  Location: [place];  Service: Orthopedics;  Laterality: Left;  . Amputation Right 04/15/2015    Procedure: AMPUTATION ABOVE KNEE, RIGHT;  Surgeon: Newt Minion, MD;  Location: [place];  Service: Orthopedics;  Laterality: Right;   Family History  Problem Relation Age of Onset  . Diabetes Mother   .  Stroke Brother   . Colon cancer Neg Hx    Social History  Substance Use Topics  . Smoking status: Current Every Day Smoker -- 0.50 packs/day for 30 years    Types: Cigarettes    Start date: 06/13/1983  . Smokeless tobacco: Never Used  . Alcohol Use: 3.0 - 3.6 oz/week    5-6 Standard drinks or equivalent per week     Comment: t states he hs rank any alcholol rcently.  drinks off and on    Review of Systems  Unable to perform ROS: Dementia  Constitutional: Negative for fever and chills.  HENT: Negative for congestion and facial swelling.   Eyes: Negative for discharge and visual disturbance.  Respiratory: Negative for shortness of  breath.   Cardiovascular: Negative for chest pain and palpitations.  Gastrointestinal: Negative for vomiting, abdominal pain and diarrhea.  Musculoskeletal: Negative for myalgias and arthralgias.  Skin: Negative for color change and rash.  Neurological: Negative for tremors, syncope and headaches.  Psychiatric/Behavioral: Negative for confusion and dysphoric mood.      Allergies  Review of patient's allergies indicates no known allergies.  Home Medications   Prior to Admission medications   Medication Sig Start Date End Date Taking? Authorizing Provider  aspirin EC 81 MG tablet Take 2 tablets (162 mg total) by mouth daily. 04/27/15  Yes Ivan Anchors Love, PA-C  atorvastatin (LIPITOR) 40 MG tablet Take 1 tablet (40 mg total) by mouth daily. 03/31/14  Yes Leeanne Rio, MD  cefTRIAXone 2 g in dextrose 5 % 50 mL Inject 2 g into the vein daily. 08/13/15 08/16/15 Yes Smiley Houseman, MD  cetirizine (ZYRTEC) 10 MG tablet Take 1 tablet (10 mg total) by mouth daily. 05/04/15  Yes Leeanne Rio, MD  cholecalciferol (VITAMIN D) 1000 units tablet Take 1,000 Units by mouth daily.   Yes Historical Provider, MD  diltiazem (CARDIZEM CD) 180 MG 24 hr capsule Take 1 capsule (180 mg total) by mouth daily. 08/13/15  Yes Smiley Houseman, MD  insulin aspart (NOVOLOG) 100 UNIT/ML injection Inject 0-9 Units into the skin 3 (three) times daily with meals. Sensitive sliding scale 08/12/15  Yes Smiley Houseman, MD  insulin glargine (LANTUS) 100 UNIT/ML injection Inject 25 units nightly. 08/12/15  Yes Smiley Houseman, MD  iron polysaccharides (NIFEREX) 150 MG capsule Take 1 capsule (150 mg total) by mouth daily. 06/28/15  Yes Hillary Corinda Gubler, MD  lisinopril (PRINIVIL,ZESTRIL) 20 MG tablet take 1 tablet by mouth once daily for blood pressure 06/01/15  Yes Leeanne Rio, MD  Multiple Vitamin (MULTIVITAMIN WITH MINERALS) TABS tablet Take 1 tablet by mouth daily. 04/27/15  Yes Ivan Anchors Love,  PA-C  Probiotic Product (PROBIOTIC ACIDOPHILUS) CAPS Take 1 tablet by mouth daily. 08/12/15  Yes Smiley Houseman, MD  acetaminophen (TYLENOL) 325 MG tablet Take 2 tablets (650 mg total) by mouth every 6 (six) hours as needed. Patient not taking: Reported on 08/13/2015 06/28/15   Rogue Bussing, MD  enoxaparin (LOVENOX) 80 MG/0.8ML injection Inject 0.8 mLs (80 mg total) into the skin every 12 (twelve) hours. 08/12/15   Smiley Houseman, MD  gabapentin (NEURONTIN) 400 MG capsule Take 2 capsules (800 mg total) by mouth 3 (three) times daily. Patient not taking: Reported on 08/13/2015 04/27/15   Ivan Anchors Love, PA-C  hydrochlorothiazide (MICROZIDE) 12.5 MG capsule Take 1 capsule (12.5 mg total) by mouth 2 (two) times daily. 08/13/15   Smiley Houseman, MD  methocarbamol (ROBAXIN) 500  MG tablet Take 1 tablet (500 mg total) by mouth every 6 (six) hours as needed for muscle spasms. Patient not taking: Reported on 08/13/2015 07/30/15   Leeanne Rio, MD  SSD 1 % cream Apply 1 application topically daily as needed. Rash/irritation 07/27/15   Historical Provider, MD  Vancomycin (VANCOCIN) 750 MG/150ML SOLN Inject 150 mLs (750 mg total) into the vein every 12 (twelve) hours. Needs 4 more days of antibiotic 08/13/15 08/16/15  Smiley Houseman, MD   BP 151/120 mmHg  Pulse 119  Temp(Src) 98 F (36.7 C) (Oral)  Resp 19  SpO2 95% Physical Exam  Constitutional: He is oriented to person, place, and time. He appears well-developed and well-nourished.  HENT:  Head: Normocephalic and atraumatic.  Eyes: EOM are normal. Pupils are equal, round, and reactive to light.  Neck: Normal range of motion. Neck supple. No JVD present.  Cardiovascular: An irregularly irregular rhythm present. Tachycardia present.  Exam reveals no gallop and no friction rub.   No murmur heard. Pulmonary/Chest: No respiratory distress. He has no wheezes.  Abdominal: He exhibits no distension. There is no tenderness. There is no  rebound and no guarding.  Musculoskeletal: Normal range of motion.  No noted picc line in place to R arm  Neurological: He is alert and oriented to person, place, and time.  Skin: No rash noted. No pallor.  Psychiatric: He has a normal mood and affect. His behavior is normal.  Nursing note and vitals reviewed.   ED Course  Procedures (including critical care time) Labs Review Labs Reviewed - No data to display  Imaging Review Ir Fluoro Guide Cv Line Right  08/13/2015  INDICATION: Patient from a nursing home who had a prior PICC that he pulled out due to altered mental status. He presents today to have replacement. EXAM: ULTRASOUND AND FLUOROSCOPIC GUIDED PICC LINE INSERTION MEDICATIONS: 1% lidocaine CONTRAST:  5 cc FLUOROSCOPY TIME:  240 seconds (4 mGy) COMPLICATIONS: None immediate. TECHNIQUE: The procedure, risks, benefits, and alternatives were explained to the patient's sister and informed written consent was obtained. A timeout was performed prior to the initiation of the procedure. The right upper extremity was prepped with chlorhexidine in a sterile fashion, and a sterile drape was applied covering the operative field. Maximum barrier sterile technique with sterile gowns and gloves were used for the procedure. A timeout was performed prior to the initiation of the procedure. Local anesthesia was provided with 1% lidocaine. Under direct ultrasound guidance, the right brachial vein was accessed with a micropuncture kit after the overlying soft tissues were anesthetized with 1% lidocaine. An ultrasound image was saved for documentation purposes. A guidewire was advanced to the level of the superior caval-atrial junction for measurement purposes and the PICC line was cut to length. A peel-away sheath was placed and a 41 cm, 5 [place], single lumen was inserted to level of the superior caval-atrial junction. A post procedure spot fluoroscopic was obtained. The catheter easily aspirated and flushed  and was secured in place. A dressing was placed. The patient tolerated the procedure well without immediate post procedural complication. FINDINGS: After catheter placement, the tip lies within the superior cavoatrial junction. The catheter aspirates and flushes normally and is ready for immediate use. IMPRESSION: Successful ultrasound and fluoroscopic guided placement of a right brachial vein approach, 41 cm, 5 [place], single lumen PICC with tip at the superior caval-atrial junction. The PICC line is ready for immediate use. Read by: Saverio Danker, PA-C Electronically Signed  By: Lucrezia Europe M.D.   On: 08/13/2015 15:04   Ir US Guide Vasc Access Right  08/13/2015  INDICATION: Patient from a nursing home who had a prior PICC that he pulled out due to altered mental status. He presents today to have replacement. EXAM: ULTRASOUND AND FLUOROSCOPIC GUIDED PICC LINE INSERTION MEDICATIONS: 1% lidocaine CONTRAST:  5 cc FLUOROSCOPY TIME:  240 seconds (4 mGy) COMPLICATIONS: None immediate. TECHNIQUE: The procedure, risks, benefits, and alternatives were explained to the patient's sister and informed written consent was obtained. A timeout was performed prior to the initiation of the procedure. The right upper extremity was prepped with chlorhexidine in a sterile fashion, and a sterile drape was applied covering the operative field. Maximum barrier sterile technique with sterile gowns and gloves were used for the procedure. A timeout was performed prior to the initiation of the procedure. Local anesthesia was provided with 1% lidocaine. Under direct ultrasound guidance, the right brachial vein was accessed with a micropuncture kit after the overlying soft tissues were anesthetized with 1% lidocaine. An ultrasound image was saved for documentation purposes. A guidewire was advanced to the level of the superior caval-atrial junction for measurement purposes and the PICC line was cut to length. A peel-away sheath was placed and  a 41 cm, 5 [place], single lumen was inserted to level of the superior caval-atrial junction. A post procedure spot fluoroscopic was obtained. The catheter easily aspirated and flushed and was secured in place. A dressing was placed. The patient tolerated the procedure well without immediate post procedural complication. FINDINGS: After catheter placement, the tip lies within the superior cavoatrial junction. The catheter aspirates and flushes normally and is ready for immediate use. IMPRESSION: Successful ultrasound and fluoroscopic guided placement of a right brachial vein approach, 41 cm, 5 [place], single lumen PICC with tip at the superior caval-atrial junction. The PICC line is ready for immediate use. Read by: Saverio Danker, PA-C Electronically Signed   By: Lucrezia Europe M.D.   On: 08/13/2015 15:04   I have personally reviewed and evaluated these images and lab results as part of my medical decision-making.   EKG Interpretation   Date/Time:  Friday August 13 2015 19:32:43 EST Ventricular Rate:  125 PR Interval:    QRS Duration: 72 QT Interval:  341 QTC Calculation: 492 R Axis:   38 Text Interpretation:  Atrial fibrillation Anterior infarct, old Abnormal  T, consider ischemia, lateral leads No significant change since last  tracing Confirmed by Gloriajean Okun MD, DANIEL [phone]) on 08/13/2015 10:35:26 PM      Procedure note: Ultrasound Guided Peripheral IV Ultrasound guided peripheral 1.88 inch angiocath IV placement performed by me. Indications: Nursing unable to place IV. Details: The antecubital fossa and upper arm were evaluated with a multifrequency linear probe. Patent brachial veins were noted. 1 attempt was made to cannulate a vein under realtime US guidance with successful cannulation of the vein and catheter placement. There is return of non-pulsatile dark red blood. The patient tolerated the procedure well without complications. Images archived electronically.  CPT codes: [phone] and [phone]  MDM    Final diagnoses:  Status post PICC central line placement    55 yo M with the PICC line removed. We'll contact IV team and see if they're able to replace PICC line. Unable to replace picc line this evening.  Will treat with abx as has not gotten any abx today.  Afib with rates in the 120's.  Will give dose of dilt.  Picc line  will be able to be place in the morning, they are recommending IR as that is how it was placed last time.  Will have return here or cone at 730 am for placement.   10:34 PM:  I have discussed the diagnosis/risks/treatment options with the patient and believe the pt to be eligible for discharge home to follow-up tomorrow here or at [facility] cone for IR placement of picc line. We also discussed returning to the ED immediately if new or worsening sx occur. We discussed the sx which are most concerning (e.g., sudden worsening pain, fever, inability to tolerate by mouth) that necessitate immediate return. Medications administered to the patient during their visit and any new prescriptions provided to the patient are listed below.  Medications given during this visit Medications  vancomycin (VANCOCIN) IVPB 750 mg/150 ml premix (750 mg Intravenous Given 08/13/15 2228)  diltiazem (CARDIZEM) injection 20 mg (20 mg Intravenous Given 08/13/15 2120)  cefTRIAXone (ROCEPHIN) 2 g in dextrose 5 % 50 mL IVPB (2 g Intravenous New Bag/Given 08/13/15 2123)    New Prescriptions   No medications on file    The patient appears reasonably screen and/or stabilized for discharge and I doubt any other medical condition or other [facility] requiring further screening, evaluation, or treatment in the ED at this time prior to discharge.    Deno Etienne, DO 08/13/15 2235

## 2015-08-13 NOTE — ED Notes (Signed)
Deno Etienne attempting IV insertion

## 2015-08-13 NOTE — ED Notes (Signed)
Delay in medication administration due to no IV access; IV team consult ordered

## 2015-08-13 NOTE — ED Notes (Addendum)
[employer] contacted; nurse states pt did not receive any antibiotics today or yesterday at their facility

## 2015-08-14 ENCOUNTER — Emergency Department (HOSPITAL_COMMUNITY): Payer: Medicaid Other

## 2015-08-14 MED ORDER — PROMETHAZINE HCL 25 MG/ML IJ SOLN
25.0000 mg | Freq: Once | INTRAMUSCULAR | Status: DC
  Filled 2015-08-14: qty 1

## 2015-08-14 MED ORDER — HEPARIN SOD (PORK) LOCK FLUSH 100 UNIT/ML IV SOLN
INTRAVENOUS | Status: AC
  Filled 2015-08-14: qty 5

## 2015-08-14 MED ORDER — LIDOCAINE HCL 1 % IJ SOLN
INTRAMUSCULAR | Status: DC | PRN
Start: 2015-08-14 — End: 2015-08-14
  Administered 2015-08-14: 10 mL

## 2015-08-14 MED ORDER — LIDOCAINE HCL 1 % IJ SOLN
INTRAMUSCULAR | Status: AC
  Filled 2015-08-14: qty 20

## 2015-08-14 MED ORDER — HEPARIN SOD (PORK) LOCK FLUSH 100 UNIT/ML IV SOLN
INTRAVENOUS | Status: DC | PRN
  Administered 2015-08-14: 500 [IU]

## 2015-08-14 NOTE — ED Notes (Signed)
Pt instructed to keep arm straight to allow IV antibiotics to flow in

## 2015-08-14 NOTE — ED Notes (Signed)
Patient returned to room 1.  Patient has new picc line placed. Lined flushed by IR with heparin.  Patient ready to DC

## 2015-08-14 NOTE — ED Notes (Signed)
[facility] here to take pt back to [facility].  

## 2015-08-14 NOTE — ED Notes (Signed)
Arm board determined to be ineffective by this nurse; Arm board removed and pt instructed to lie still with both arms flat

## 2015-08-14 NOTE — ED Notes (Signed)
Bed: WA01 Expected date:  Expected time:  Means of arrival:  Comments: Patient in IR for PICC line placement

## 2015-08-14 NOTE — Procedures (Signed)
Successful placement of single lumen PICC line to left basilic vein. Length 43 cm Tip at lower SVC/RA No complications Ready for use.  Sriman Tally PA-C 1:26 PM

## 2015-08-14 NOTE — ED Notes (Signed)
IV Pump beeping; P instructed to keep arm straight; pt straightened arm.

## 2015-08-14 NOTE — ED Notes (Addendum)
Steven [name] sister called and consented for PICC procedure. IR to perform today. EDP LIU spoke with sister. This Probation officer verified her understanding. All questions answered.

## 2015-08-14 NOTE — ED Notes (Signed)
Arm board applied to help pt keep arm straight and allow IV antibiotics to flow in

## 2015-08-14 NOTE — ED Notes (Signed)
Bed: [facility] Expected date:  Expected time:  Means of arrival:  Comments: 

## 2015-08-14 NOTE — ED Notes (Signed)
This nurse evaluated IV Pump that was beeping; IV insertion site redressed to promote flow of antibitotics

## 2015-08-14 NOTE — Discharge Instructions (Signed)
Please apply more protective dressing or keep PICC line covered with long sleeves when not in use as this patient tends to pull out his PICC line quite frequently.   PICC Insertion, Care After Refer to this sheet in the next few weeks. These instructions provide you with information on caring for yourself after your procedure. Your health care provider may also give you more specific instructions. Your treatment has been planned according to current medical practices, but problems sometimes occur. Call your health care provider if you have any problems or questions after your procedure. WHAT TO EXPECT AFTER THE PROCEDURE After your procedure, it is typical to have the following:  Mild discomfort at the insertion site. This should not last more than a day. HOME CARE INSTRUCTIONS  Rest at home for the remainder of the day after the procedure.  You may bend your arm and move it freely. If your PICC is near or at the bend of your elbow, avoid activity with repeated motion at the elbow.  Avoid lifting heavy objects as instructed by your health care provider.  Avoid using a crutch with the arm on the same side as your PICC. You may need to use a walker. Bandage Care  Keep your PICC bandage (dressing) clean and dry to prevent infection.  Ask your health care provider when you may shower. To keep the dressing dry, cover the PICC with plastic wrap and tape before showering. If the dressing does become wet, replace it right after the shower.  Do not soak in the bath, swim, or use hot tubs when you have a PICC.  Change the PICC dressing as instructed by your health care provider.  Change your PICC dressing if it becomes loose or wet. General PICC Care  Check the PICC insertion site daily for leakage, redness, swelling, or pain.  Flush the PICC as directed by your health care provider. Let your health care provider know right away if the PICC is difficult to flush or does not flush. Do not use  force to flush the PICC.  Do not use a syringe that is less than 10 mL to flush the PICC.  Never pull or tug on the PICC.  Avoid blood pressure checks on the arm with the PICC.  Keep your PICC identification card with you at all times.  Do not take the PICC out yourself. Only a trained health care professional should remove the PICC. SEEK MEDICAL CARE IF:  You have pain in your arm, ear, face, or teeth.  You have fever or chills.  You have drainage from the PICC insertion site.  You have redness or palpate a "cord" around the PICC insertion site.  You cannot flush the catheter. SEEK IMMEDIATE MEDICAL CARE IF:  You have swelling in the arm in which the PICC is inserted.   This information is not intended to replace advice given to you by your health care provider. Make sure you discuss any questions you have with your health care provider.   Document Released: 03/19/2013 Document Revised: 06/03/2013 Document Reviewed: 03/19/2013 Elsevier Interactive Patient Education [employer].
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
## 2015-08-14 NOTE — ED Provider Notes (Signed)
Please see previous physicians note regarding patient's presenting history and physical, initial ED course, and associated MDM. Patient presents from nursing facility after PICC line displacement. 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 History of dementia, and is reportedly at his baseline mental status. Vital signs have been stable here in the emergency department. Spoke to IR who will place PICC line. From the ED. Consent was obtained by patient's sister. Line placed, and he continues to appear stable. We'll discharge back to nursing facility.  Forde Dandy, MD 08/14/15 1420

## 2015-08-14 NOTE — ED Notes (Addendum)
Pt sitting with arms crossed; pt instructed to extend arm; pt refused and became combative, shouting "No" repeatedly when this nurse touched his arm

## 2015-08-16 ENCOUNTER — Emergency Department (HOSPITAL_COMMUNITY)
Admission: EM | Admit: 2015-08-16 | Discharge: 2015-08-23 | Disposition: A | Discharge status: Home / Self Care | Payer: Medicaid Other | Attending: Internal Medicine | Admitting: Internal Medicine

## 2015-08-16 ENCOUNTER — Encounter: Payer: Self-pay | Admitting: Internal Medicine

## 2015-08-16 ENCOUNTER — Encounter (HOSPITAL_COMMUNITY): Payer: Self-pay | Admitting: *Deleted

## 2015-08-16 ENCOUNTER — Non-Acute Institutional Stay (SKILLED_NURSING_FACILITY): Payer: Medicaid Other | Admitting: Internal Medicine

## 2015-08-16 DIAGNOSIS — Z89512 Acquired absence of left leg below knee: Secondary | ICD-10-CM

## 2015-08-16 DIAGNOSIS — Z794 Long term (current) use of insulin: Secondary | ICD-10-CM

## 2015-08-16 DIAGNOSIS — E15 Nondiabetic hypoglycemic coma: Secondary | ICD-10-CM

## 2015-08-16 DIAGNOSIS — T383X5A Adverse effect of insulin and oral hypoglycemic [antidiabetic] drugs, initial encounter: Secondary | ICD-10-CM

## 2015-08-16 DIAGNOSIS — Z79899 Other long term (current) drug therapy: Secondary | ICD-10-CM | POA: Diagnosis not present

## 2015-08-16 DIAGNOSIS — I4891 Unspecified atrial fibrillation: Secondary | ICD-10-CM | POA: Insufficient documentation

## 2015-08-16 DIAGNOSIS — IMO0002 Reserved for concepts with insufficient information to code with codable children: Secondary | ICD-10-CM | POA: Diagnosis present

## 2015-08-16 DIAGNOSIS — R4689 Other symptoms and signs involving appearance and behavior: Secondary | ICD-10-CM

## 2015-08-16 DIAGNOSIS — T8789 Other complications of amputation stump: Secondary | ICD-10-CM | POA: Diagnosis not present

## 2015-08-16 DIAGNOSIS — R6510 Systemic inflammatory response syndrome (SIRS) of non-infectious origin without acute organ dysfunction: Secondary | ICD-10-CM | POA: Diagnosis not present

## 2015-08-16 DIAGNOSIS — Z7901 Long term (current) use of anticoagulants: Secondary | ICD-10-CM | POA: Diagnosis not present

## 2015-08-16 DIAGNOSIS — G629 Polyneuropathy, unspecified: Secondary | ICD-10-CM | POA: Diagnosis not present

## 2015-08-16 DIAGNOSIS — Z86718 Personal history of other venous thrombosis and embolism: Secondary | ICD-10-CM | POA: Diagnosis not present

## 2015-08-16 DIAGNOSIS — L97909 Non-pressure chronic ulcer of unspecified part of unspecified lower leg with unspecified severity: Secondary | ICD-10-CM

## 2015-08-16 DIAGNOSIS — E785 Hyperlipidemia, unspecified: Secondary | ICD-10-CM

## 2015-08-16 DIAGNOSIS — I10 Essential (primary) hypertension: Secondary | ICD-10-CM

## 2015-08-16 DIAGNOSIS — F1910 Other psychoactive substance abuse, uncomplicated: Secondary | ICD-10-CM

## 2015-08-16 DIAGNOSIS — E119 Type 2 diabetes mellitus without complications: Secondary | ICD-10-CM | POA: Insufficient documentation

## 2015-08-16 DIAGNOSIS — I2699 Other pulmonary embolism without acute cor pulmonale: Secondary | ICD-10-CM

## 2015-08-16 DIAGNOSIS — G0490 Encephalitis and encephalomyelitis, unspecified: Secondary | ICD-10-CM | POA: Diagnosis not present

## 2015-08-16 DIAGNOSIS — R911 Solitary pulmonary nodule: Secondary | ICD-10-CM | POA: Diagnosis not present

## 2015-08-16 DIAGNOSIS — G621 Alcoholic polyneuropathy: Secondary | ICD-10-CM

## 2015-08-16 DIAGNOSIS — Z5189 Encounter for other specified aftercare: Secondary | ICD-10-CM

## 2015-08-16 DIAGNOSIS — Z89511 Acquired absence of right leg below knee: Secondary | ICD-10-CM

## 2015-08-16 DIAGNOSIS — F17210 Nicotine dependence, cigarettes, uncomplicated: Secondary | ICD-10-CM | POA: Insufficient documentation

## 2015-08-16 DIAGNOSIS — Z7982 Long term (current) use of aspirin: Secondary | ICD-10-CM | POA: Insufficient documentation

## 2015-08-16 DIAGNOSIS — E1165 Type 2 diabetes mellitus with hyperglycemia: Secondary | ICD-10-CM | POA: Diagnosis present

## 2015-08-16 DIAGNOSIS — Z8739 Personal history of other diseases of the musculoskeletal system and connective tissue: Secondary | ICD-10-CM | POA: Diagnosis not present

## 2015-08-16 DIAGNOSIS — F4324 Adjustment disorder with disturbance of conduct: Secondary | ICD-10-CM | POA: Insufficient documentation

## 2015-08-16 DIAGNOSIS — Z008 Encounter for other general examination: Secondary | ICD-10-CM | POA: Diagnosis present

## 2015-08-16 DIAGNOSIS — E160 Drug-induced hypoglycemia without coma: Secondary | ICD-10-CM | POA: Diagnosis present

## 2015-08-16 DIAGNOSIS — E1152 Type 2 diabetes mellitus with diabetic peripheral angiopathy with gangrene: Secondary | ICD-10-CM

## 2015-08-16 DIAGNOSIS — F419 Anxiety disorder, unspecified: Secondary | ICD-10-CM | POA: Insufficient documentation

## 2015-08-16 DIAGNOSIS — F4321 Adjustment disorder with depressed mood: Secondary | ICD-10-CM | POA: Diagnosis present

## 2015-08-16 DIAGNOSIS — F6089 Other specific personality disorders: Secondary | ICD-10-CM

## 2015-08-16 DIAGNOSIS — I480 Paroxysmal atrial fibrillation: Secondary | ICD-10-CM

## 2015-08-16 DIAGNOSIS — Z86018 Personal history of other benign neoplasm: Secondary | ICD-10-CM | POA: Diagnosis not present

## 2015-08-16 HISTORY — DX: Major depressive disorder, single episode, unspecified: F32.9

## 2015-08-16 LAB — URINALYSIS, ROUTINE W REFLEX MICROSCOPIC
Bilirubin Urine: NEGATIVE
Glucose, UA: NEGATIVE mg/dL
Ketones, ur: NEGATIVE mg/dL
Leukocytes, UA: NEGATIVE
Nitrite: NEGATIVE
Specific Gravity, Urine: 1.021 (ref 1.005–1.030)
pH: 5.5 (ref 5.0–8.0)

## 2015-08-16 LAB — COMPREHENSIVE METABOLIC PANEL
ALBUMIN: 2.8 g/dL — AB (ref 3.5–5.0)
ALT: 17 U/L (ref 17–63)
AST: 23 U/L (ref 15–41)
Alkaline Phosphatase: 57 U/L (ref 38–126)
Anion gap: 8 (ref 5–15)
BUN: 7 mg/dL (ref 6–20)
CHLORIDE: 110 mmol/L (ref 101–111)
CO2: 29 mmol/L (ref 22–32)
CREATININE: 1.08 mg/dL (ref 0.61–1.24)
Calcium: 9.1 mg/dL (ref 8.9–10.3)
GFR calc Af Amer: >60 mL/min (ref >60)
GFR calc non Af Amer: >60 mL/min (ref >60)
GLUCOSE: 105 mg/dL — AB (ref 65–99)
Potassium: 2.8 mmol/L — ABNORMAL LOW (ref 3.5–5.1)
SODIUM: 147 mmol/L — AB (ref 135–145)
Total Bilirubin: 0.5 mg/dL (ref 0.3–1.2)
Total Protein: 7.5 g/dL (ref 6.5–8.1)

## 2015-08-16 LAB — RAPID URINE DRUG SCREEN, HOSP PERFORMED
AMPHETAMINES: NOT DETECTED
BENZODIAZEPINES: NOT DETECTED
Barbiturates: NOT DETECTED
Cocaine: NOT DETECTED
Opiates: NOT DETECTED
TETRAHYDROCANNABINOL: NOT DETECTED

## 2015-08-16 LAB — CBC WITH DIFFERENTIAL/PLATELET
BASOS ABS: 0.1 10*3/uL (ref 0.0–0.1)
BASOS PCT: 1 %
EOS ABS: 0.3 10*3/uL (ref 0.0–0.7)
EOS PCT: 4 %
HCT: 43 % (ref 39.0–52.0)
Hemoglobin: 14.4 g/dL (ref 13.0–17.0)
LYMPHS PCT: 38 %
Lymphs Abs: 2.5 10*3/uL (ref 0.7–4.0)
MCH: 28.6 pg (ref 26.0–34.0)
MCHC: 33.5 g/dL (ref 30.0–36.0)
MCV: 85.3 fL (ref 78.0–100.0)
Monocytes Absolute: 0.5 10*3/uL (ref 0.1–1.0)
Monocytes Relative: 8 %
Neutro Abs: 3.2 10*3/uL (ref 1.7–7.7)
Neutrophils Relative %: 49 %
PLATELETS: 246 10*3/uL (ref 150–400)
RBC: 5.04 MIL/uL (ref 4.22–5.81)
RDW: 15.3 % (ref 11.5–15.5)
WBC: 6.5 10*3/uL (ref 4.0–10.5)

## 2015-08-16 LAB — URINE MICROSCOPIC-ADD ON

## 2015-08-16 LAB — CBG MONITORING, ED
GLUCOSE-CAPILLARY: 126 mg/dL — AB (ref 65–99)
GLUCOSE-CAPILLARY: 88 mg/dL (ref 65–99)

## 2015-08-16 LAB — ETHANOL: Alcohol, Ethyl (B): <5 mg/dL (ref <5)

## 2015-08-16 MED ORDER — RISAQUAD PO CAPS
1.0000 | ORAL_CAPSULE | Freq: Every day | ORAL | Status: DC
  Administered 2015-08-17 – 2015-08-23 (×7): 1 via ORAL
  Filled 2015-08-16 (×7): qty 1

## 2015-08-16 MED ORDER — LISINOPRIL 20 MG PO TABS
20.0000 mg | ORAL_TABLET | Freq: Every day | ORAL | Status: DC
  Administered 2015-08-16 – 2015-08-23 (×8): 20 mg via ORAL
  Filled 2015-08-16 (×8): qty 1

## 2015-08-16 MED ORDER — INSULIN GLARGINE 100 UNIT/ML ~~LOC~~ SOLN: 25.0000 [IU] | Freq: Every day | SUBCUTANEOUS | Status: DC

## 2015-08-16 MED ORDER — ENOXAPARIN SODIUM 80 MG/0.8ML ~~LOC~~ SOLN
80.0000 mg | Freq: Two times a day (BID) | SUBCUTANEOUS | Status: DC
  Administered 2015-08-17 – 2015-08-23 (×13): 80 mg via SUBCUTANEOUS
  Filled 2015-08-16 (×16): qty 0.8

## 2015-08-16 MED ORDER — DILTIAZEM HCL ER COATED BEADS 180 MG PO CP24
180.0000 mg | ORAL_CAPSULE | Freq: Every day | ORAL | Status: DC
  Administered 2015-08-16 – 2015-08-23 (×8): 180 mg via ORAL
  Filled 2015-08-16 (×8): qty 1

## 2015-08-16 MED ORDER — ONDANSETRON HCL 4 MG PO TABS: 4.0000 mg | ORAL_TABLET | Freq: Three times a day (TID) | ORAL | Status: DC | PRN

## 2015-08-16 MED ORDER — INSULIN ASPART 100 UNIT/ML ~~LOC~~ SOLN: 0.0000 [IU] | Freq: Three times a day (TID) | SUBCUTANEOUS | Status: DC

## 2015-08-16 MED ORDER — LORATADINE 10 MG PO TABS
10.0000 mg | ORAL_TABLET | Freq: Every day | ORAL | Status: DC
  Administered 2015-08-17 – 2015-08-23 (×7): 10 mg via ORAL
  Filled 2015-08-16 (×7): qty 1

## 2015-08-16 MED ORDER — HYDROCHLOROTHIAZIDE 12.5 MG PO CAPS
12.5000 mg | ORAL_CAPSULE | Freq: Two times a day (BID) | ORAL | Status: DC
  Administered 2015-08-17 – 2015-08-23 (×11): 12.5 mg via ORAL
  Filled 2015-08-16 (×11): qty 1

## 2015-08-16 MED ORDER — ATORVASTATIN CALCIUM 40 MG PO TABS
40.0000 mg | ORAL_TABLET | Freq: Every day | ORAL | Status: DC
  Administered 2015-08-17 – 2015-08-22 (×5): 40 mg via ORAL
  Filled 2015-08-16 (×9): qty 1

## 2015-08-16 MED ORDER — POTASSIUM CHLORIDE CRYS ER 20 MEQ PO TBCR
60.0000 meq | EXTENDED_RELEASE_TABLET | Freq: Once | ORAL | Status: AC
  Administered 2015-08-16: 60 meq via ORAL
  Filled 2015-08-16: qty 3

## 2015-08-16 MED ORDER — SODIUM CHLORIDE 0.9 % IV BOLUS (SEPSIS)
1000.0000 mL | Freq: Once | INTRAVENOUS | Status: AC
  Administered 2015-08-16: 1000 mL via INTRAVENOUS

## 2015-08-16 MED ORDER — ADULT MULTIVITAMIN W/MINERALS CH
1.0000 | ORAL_TABLET | Freq: Every day | ORAL | Status: DC
  Administered 2015-08-17 – 2015-08-23 (×7): 1 via ORAL
  Filled 2015-08-16 (×7): qty 1

## 2015-08-16 MED ORDER — LORAZEPAM 2 MG/ML IJ SOLN
1.0000 mg | Freq: Once | INTRAMUSCULAR | Status: AC
  Administered 2015-08-16: 1 mg via INTRAVENOUS
  Filled 2015-08-16: qty 1

## 2015-08-16 MED ORDER — ASPIRIN EC 81 MG PO TBEC
160.0000 mg | DELAYED_RELEASE_TABLET | Freq: Every day | ORAL | Status: DC
  Administered 2015-08-17 – 2015-08-23 (×7): 162 mg via ORAL
  Filled 2015-08-16 (×7): qty 2
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
## 2015-08-16 NOTE — ED Provider Notes (Signed)
CSN: [id number]     Arrival date & time 08/16/15  1752 History   First MD Initiated Contact with Patient 08/16/15 1834     Chief Complaint  Patient presents with  . IVC   . Aggressive Behavior     (Consider location/radiation/quality/duration/timing/severity/associated sxs/prior Treatment) HPI Comments: Patient here with increased agitation and violent behavior at the nursing home. Per the records, patient attempted to leave the facility after being seen by their physician. Patient was placed on an involuntary commitment and is not allowed to return to nursing home. The nursing home was contacted by the patient's nurse and they state that the patient has not had any recent fever or falls. No medications were given prior to arrival and patient transported by EMS. Patient CBG was over 100  The history is provided by the patient, the EMS personnel and medical records.    Past Medical History  Diagnosis Date  . Uncontrolled diabetes mellitus ([place])   . HTN (hypertension)   . History of DVT (deep vein thrombosis)   . Personal history of diabetic foot ulcer   . Peripheral vascular disease ([place])     a. Abnl ABIs 2014.  Marland Kitchen Peripheral neuropathy ([place]) 11/19/2011  . Varicose vein     legs  . Osteomyelitis of foot, right, acute ([place]) 08/30/2012  . Personal history of colonic adenomas 05/29/2013  . History of cocaine use   . PAF (paroxysmal atrial fibrillation) ([place])     a. Dx 11/2013 in setting of sepsis/foot infection.  . Tobacco abuse   . H/O ETOH abuse   . Dysrhythmia     afib last admission  . Seasonal allergies   . Adjustment disorder with depressed mood    Past Surgical History  Procedure Laterality Date  . Amputation  04/21/2011    Procedure: AMPUTATION RAY;  Surgeon: Newt Minion, MD;  Location: [facility];  Service: Orthopedics;  Laterality: Right;  Rt foot 2nd ray ampt  . I&d extremity  05/03/2011    Procedure: IRRIGATION AND DEBRIDEMENT EXTREMITY;  Surgeon: Newt Minion, MD;   Location: [facility];  Service: Orthopedics;  Laterality: Right;  Irrigation and Debridement Right Foot and Place Acell Xenograft  . Toe amputation  04/24/2012    great toe   right foot  . Amputation  04/24/2012    Procedure: AMPUTATION RAY;  Surgeon: Newt Minion, MD;  Location: [facility];  Service: Orthopedics;  Laterality: Right;  Right foot 1st ray amputation  . Amputation Left 04/23/2013    Procedure: AMPUTATION RAY;  Surgeon: Newt Minion, MD;  Location: [facility];  Service: Orthopedics;  Laterality: Left;  Left Foot 5th Ray Amputation  . Colonoscopy    . Amputation Left 11/21/2013    Procedure: AMPUTATION RAY;  Surgeon: Newt Minion, MD;  Location: [facility];  Service: Orthopedics;  Laterality: Left;  Left Foot 1st & 2nd Ray Amputation  . Amputation Right 12/04/2013    Procedure: AMPUTATION BELOW KNEE;  Surgeon: Marianna Payment, MD;  Location: [facility];  Service: Orthopedics;  Laterality: Right;  . Amputation Left 12/29/2013    Procedure: LEFT AMPUTATION BELOW KNEE;  Surgeon: Marianna Payment, MD;  Location: [facility];  Service: Orthopedics;  Laterality: Left;  . Amputation Right 04/15/2015    Procedure: AMPUTATION ABOVE KNEE, RIGHT;  Surgeon: Newt Minion, MD;  Location: [facility];  Service: Orthopedics;  Laterality: Right;   Family History  Problem Relation Age of Onset  . Diabetes Mother   .  Stroke Brother   . Colon cancer Neg Hx    Social History  Substance Use Topics  . Smoking status: Current Every Day Smoker -- 0.50 packs/day for 30 years    Types: Cigarettes    Start date: 06/13/1983  . Smokeless tobacco: Never Used  . Alcohol Use: 3.0 - 3.6 oz/week    5-6 Standard drinks or equivalent per week     Comment: t states he hs rank any alcholol rcently.  drinks off and on    Review of Systems  Unable to perform ROS: Acuity of condition      Allergies  Review of patient's allergies indicates no known allergies.  Home Medications   Prior to Admission medications   Medication Sig Start  Date End Date Taking? Authorizing Provider  acetaminophen (TYLENOL) 325 MG tablet Take 2 tablets (650 mg total) by mouth every 6 (six) hours as needed. Patient not taking: Reported on 08/13/2015 06/28/15   Rogue Bussing, MD  aspirin EC 81 MG tablet Take 2 tablets (162 mg total) by mouth daily. 04/27/15   Bary Leriche, PA-C  atorvastatin (LIPITOR) 40 MG tablet Take 1 tablet (40 mg total) by mouth daily. 03/31/14   Leeanne Rio, MD  cefTRIAXone 2 g in dextrose 5 % 50 mL Inject 2 g into the vein daily. 08/13/15 08/16/15  Smiley Houseman, MD  cetirizine (ZYRTEC) 10 MG tablet Take 1 tablet (10 mg total) by mouth daily. 05/04/15   Leeanne Rio, MD  cholecalciferol (VITAMIN D) 1000 units tablet Take 1,000 Units by mouth daily.    Historical Provider, MD  diltiazem (CARDIZEM CD) 180 MG 24 hr capsule Take 1 capsule (180 mg total) by mouth daily. 08/13/15   Smiley Houseman, MD  enoxaparin (LOVENOX) 80 MG/0.8ML injection Inject 0.8 mLs (80 mg total) into the skin every 12 (twelve) hours. 08/12/15   Smiley Houseman, MD  gabapentin (NEURONTIN) 400 MG capsule Take 2 capsules (800 mg total) by mouth 3 (three) times daily. Patient not taking: Reported on 08/13/2015 04/27/15   Ivan Anchors Love, PA-C  hydrochlorothiazide (MICROZIDE) 12.5 MG capsule Take 1 capsule (12.5 mg total) by mouth 2 (two) times daily. 08/13/15   Smiley Houseman, MD  insulin aspart (NOVOLOG) 100 UNIT/ML injection Inject 0-9 Units into the skin 3 (three) times daily with meals. Sensitive sliding scale 08/12/15   Smiley Houseman, MD  insulin glargine (LANTUS) 100 UNIT/ML injection Inject 25 units nightly. 08/12/15   Smiley Houseman, MD  iron polysaccharides (NIFEREX) 150 MG capsule Take 1 capsule (150 mg total) by mouth daily. 06/28/15   Hillary Corinda Gubler, MD  lisinopril (PRINIVIL,ZESTRIL) 20 MG tablet take 1 tablet by mouth once daily for blood pressure 06/01/15   Leeanne Rio, MD  methocarbamol (ROBAXIN)  500 MG tablet Take 1 tablet (500 mg total) by mouth every 6 (six) hours as needed for muscle spasms. Patient not taking: Reported on 08/13/2015 07/30/15   Leeanne Rio, MD  Multiple Vitamin (MULTIVITAMIN WITH MINERALS) TABS tablet Take 1 tablet by mouth daily. 04/27/15   Bary Leriche, PA-C  Probiotic Product (PROBIOTIC ACIDOPHILUS) CAPS Take 1 tablet by mouth daily. 08/12/15   Smiley Houseman, MD  SSD 1 % cream Apply 1 application topically daily as needed. Rash/irritation 07/27/15   Historical Provider, MD  Vancomycin (VANCOCIN) 750 MG/150ML SOLN Inject 150 mLs (750 mg total) into the vein every 12 (twelve) hours. Needs 4 more days of antibiotic 08/13/15 08/16/15  Smiley Houseman, MD   BP 187/103 mmHg  Pulse 94  Temp(Src) 98.3 F (36.8 C) (Oral)  Resp 18  SpO2 99% Physical Exam  Constitutional: He appears well-developed and well-nourished.  Non-toxic appearance. No distress.  HENT:  Head: Normocephalic and atraumatic.  Eyes: Conjunctivae, EOM and lids are normal. Pupils are equal, round, and reactive to light.  Neck: Normal range of motion. Neck supple. No tracheal deviation present. No thyroid mass present.  Cardiovascular: Normal rate, regular rhythm and normal heart sounds.  Exam reveals no gallop.   No murmur heard. Pulmonary/Chest: Effort normal and breath sounds normal. No stridor. No respiratory distress. He has no decreased breath sounds. He has no wheezes. He has no rhonchi. He has no rales.  Abdominal: Soft. Normal appearance and bowel sounds are normal. He exhibits no distension. There is no tenderness. There is no rebound and no CVA tenderness.  Musculoskeletal: Normal range of motion. He exhibits no edema or tenderness.  Neurological: He is alert. No cranial nerve deficit or sensory deficit. GCS eye subscore is 4. GCS verbal subscore is 4. GCS motor subscore is 6.  Skin: Skin is warm and dry. No abrasion and no rash noted.  Psychiatric: His mood appears anxious. His  speech is tangential. He is aggressive.  Nursing note and vitals reviewed.   ED Course  Procedures (including critical care time) Labs Review Labs Reviewed  CBG MONITORING, ED - Abnormal; Notable for the following:    Glucose-Capillary 126 (*)    All other components within normal limits  URINE CULTURE  URINALYSIS, ROUTINE W REFLEX MICROSCOPIC (NOT AT [facility])  CBC WITH DIFFERENTIAL/PLATELET  COMPREHENSIVE METABOLIC PANEL  ETHANOL  URINE RAPID DRUG SCREEN, HOSP PERFORMED    Imaging Review No results found. I have personally reviewed and evaluated these images and lab results as part of my medical decision-making.   EKG Interpretation None      MDM   Final diagnoses:  None    Patient given Ativan 1 mg IV push for agitation. Patient with mild hypernatremia and was given 1 L of saline. His mild hypokalemia treated with 60 mEq of potassium. We will recheck a bmet The patient's nurse spoke with the patient's relative states that he is at his baseline currently. Patient did manage to fall out of the bed. He was examined and no evidence of acute injury. He was placed in soft restraints for his protection. He is now medically clear for disposition by psychiatry    Lacretia Leigh, MD 08/16/15 [phone]
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
## 2015-08-16 NOTE — ED Notes (Signed)
This nurse spoke to pt's sister. Pt's sister states the pt has appeared confused, unable to comprehend others and "out of it" since a drug overdose in late February of this year.

## 2015-08-17 DIAGNOSIS — T8789 Other complications of amputation stump: Secondary | ICD-10-CM

## 2015-08-17 DIAGNOSIS — L97909 Non-pressure chronic ulcer of unspecified part of unspecified lower leg with unspecified severity: Secondary | ICD-10-CM | POA: Insufficient documentation

## 2015-08-17 DIAGNOSIS — R6510 Systemic inflammatory response syndrome (SIRS) of non-infectious origin without acute organ dysfunction: Secondary | ICD-10-CM | POA: Insufficient documentation

## 2015-08-17 DIAGNOSIS — F4324 Adjustment disorder with disturbance of conduct: Secondary | ICD-10-CM | POA: Diagnosis present

## 2015-08-17 DIAGNOSIS — E15 Nondiabetic hypoglycemic coma: Secondary | ICD-10-CM | POA: Insufficient documentation

## 2015-08-17 DIAGNOSIS — I2699 Other pulmonary embolism without acute cor pulmonale: Secondary | ICD-10-CM | POA: Insufficient documentation

## 2015-08-17 DIAGNOSIS — R4689 Other symptoms and signs involving appearance and behavior: Secondary | ICD-10-CM | POA: Insufficient documentation

## 2015-08-17 LAB — CBG MONITORING, ED
GLUCOSE-CAPILLARY: 153 mg/dL — AB (ref 65–99)
Glucose-Capillary: 98 mg/dL (ref 65–99)
Glucose-Capillary: 133 mg/dL — ABNORMAL HIGH (ref 65–99)
Glucose-Capillary: 267 mg/dL — ABNORMAL HIGH (ref 65–99)
Glucose-Capillary: 117 mg/dL — ABNORMAL HIGH (ref 65–99)

## 2015-08-17 MED ORDER — STERILE WATER FOR INJECTION IJ SOLN
INTRAMUSCULAR | Status: AC
  Administered 2015-08-17: 1.2 mL
  Filled 2015-08-17: qty 10

## 2015-08-17 MED ORDER — INSULIN GLARGINE 100 UNIT/ML ~~LOC~~ SOLN
25.0000 [IU] | Freq: Every day | SUBCUTANEOUS | Status: DC
  Administered 2015-08-17 – 2015-08-18 (×2): 25 [IU] via SUBCUTANEOUS
  Filled 2015-08-17 (×2): qty 0.25

## 2015-08-17 MED ORDER — LORAZEPAM 1 MG PO TABS
1.0000 mg | ORAL_TABLET | Freq: Once | ORAL | Status: DC
  Filled 2015-08-17 (×2): qty 1

## 2015-08-17 MED ORDER — LORAZEPAM 1 MG PO TABS
1.0000 mg | ORAL_TABLET | ORAL | Status: DC | PRN
Start: 2015-08-17 — End: 2015-08-23
  Administered 2015-08-17 – 2015-08-22 (×10): 1 mg via ORAL
  Filled 2015-08-17 (×9): qty 1

## 2015-08-17 MED ORDER — INSULIN ASPART 100 UNIT/ML ~~LOC~~ SOLN
0.0000 [IU] | Freq: Three times a day (TID) | SUBCUTANEOUS | Status: DC
  Administered 2015-08-17: 5 [IU] via SUBCUTANEOUS
  Administered 2015-08-17: 2 [IU] via SUBCUTANEOUS
  Administered 2015-08-18 – 2015-08-20 (×2): 1 [IU] via SUBCUTANEOUS
  Filled 2015-08-17 (×4): qty 1

## 2015-08-17 MED ORDER — ZIPRASIDONE MESYLATE 20 MG IM SOLR
INTRAMUSCULAR | Status: AC
  Administered 2015-08-17: 20 mg
  Filled 2015-08-17: qty 20

## 2015-08-17 NOTE — Assessment & Plan Note (Signed)
SNF - Patient's diltiazem was increased from 120 mg to 180 mg for better HR and BP control. Additionally, HCTZ was added to his medication regimen. Will continue those meds and isinopril 20 mg daily; will f/u BMP

## 2015-08-17 NOTE — Assessment & Plan Note (Signed)
s/p R AKA (performed in 11/16 by Dr. Sharol Given). MRI unobtainable due to motion artifact. Plain film with deep soft tissue air. Area not clinically appearing infected. Per ortho (Dr. Sharol Given), stump infection is unlikely cause of AMS. Per ID, continue two week course of vanc and CTX for coverage of possible stump infection and possible meningitis.  - Per ortho, continue Santyl dressings. Current abx sufficient to cover possible infection.  - ID consulted, signed off  - Cont vanc, CTX

## 2015-08-17 NOTE — Assessment & Plan Note (Signed)
SNF - Was considered a possibility so pt was d/c on vancomycin and rocephin via PICC; at the time i am seeing im he has pulled his PICC line out for the 3rd time in as many day. Today id pt's ;last day for abx so rocephin 2 gm IM will be given and will d/c last day of vanc

## 2015-08-17 NOTE — Assessment & Plan Note (Addendum)
Pt was found unresponsive and seizing at home . He was noted to have CBG of 30 upon arrival of EMS, which improved only minimally with administration of three amps of D50. CBG stabilized with initiation of D10.  Neurology was consulted, who felt that patient's altered mental status and prolonged unresponsiveness was due to either repeated episodes of extreme hypoglycemia, or a prolonged episode of hypoglycemia, with permanent brain damage. Pt improved, opened eyes, but did not speak or follow commands;  was made DNR but sister continued to want full treatment.

## 2015-08-17 NOTE — ED Notes (Signed)
Pt. CBG 267,RN,Joann made aware.

## 2015-08-17 NOTE — Consult Note (Signed)
Telepsych Consultation   Reason for Consult:  Evaluation for agitation/violent behaviors Referring Physician:  Lacretia Leigh MD Patient Identification: Steven [name] MRN:  [id number] Principal Diagnosis: Vascular Dementia with Agitation Patient Active Problem List   Diagnosis Date Noted  . Adjustment disorder with disturbance of conduct [F43.24] 08/17/2015  . Hypoglycemia, coma ([place]) [E15] 08/17/2015  . SIRS (systemic inflammatory response syndrome) (HCC) [R65.10] 08/17/2015  . Multiple pulmonary emboli ([place]) [I26.99] 08/17/2015  . Ulcer of amputation stump of lower extremity ([place]) [T87.89, L97.909] 08/17/2015  . Aggressive behavior of adult [F60.89] 08/17/2015  . Palliative care encounter [Z51.5]   . Goals of care, counseling/discussion [Z71.89]   . Dysphagia [R13.10]   . DNR (do not resuscitate) [Z66]   . Stump pain ([place]) [T87.89]   . Lung nodule seen on imaging study [R91.1] 08/04/2015  . Seizure ([place]) [R56.9]   . Tachycardia [R00.0]   . FUO (fever of unknown origin) [R50.9]   . 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 Polysubstance abuse [F19.10]   . Hypoglycemia [E16.2]   . Altered mental status [R41.82] 08/03/2015  . Patient cannot afford medications [Z59.6] 06/04/2015  . Paroxysmal a-fib ([place]) [I48.0]   . Adjustment disorder with depressed mood [F43.21]   . History of left below knee amputation ([place]) [T61.443] 04/22/2015  . Unilateral complete AKA ([place]) [Z89.619] 04/19/2015  . Dry gangrene ([place]) [I96]   . Right leg pain [M79.604] 04/14/2015  . Cough [R05] 08/12/2014  . Bilateral hand pain [id number], M79.642] 05/29/2014  . Acne [L70.9] 03/30/2014  . S/P bilateral BKA (below knee amputation) ([place]) [X54.008, Z89.511] 01/22/2014  . Cocaine abuse [F14.10] 12/05/2013  . Tobacco abuse [Z72.0] 12/04/2013  . Peripheral vascular disease ([place]) [I73.9]   . Atrial fibrillation ([place]) [I48.91] 12/03/2013  . Rhinitis medicamentosa [J31.0, T48.5X1A] 07/02/2013  . History of colonic polyps  [Z86.010] 05/29/2013  . Diabetic retinopathy ([place]) [E11.319] 12/25/2012  . HLD (hyperlipidemia) [E78.5] 12/08/2012  . Peripheral neuropathy ([place]) [G62.9] 11/19/2011  . ETOH abuse [F10.10] 03/29/2011  . HTN (hypertension) [I10] 12/22/2010  . Diabetes mellitus type II, uncontrolled ([place]) [E11.65] 07/18/2006  . VENOUS INSUFFICIENCY [I87.2] 07/18/2006    Total Time spent with patient: 15 minutes  Subjective:   Steven [name] is a 55 y.o. male patient admitted to the [facility] ED currently under IVC due to reported agitation and violent behaviors at his nursing home [facility]). No additional collateral information is available from NH staff at this present time, Nor additional subjective information retrievable per the patient due to his long standing Dementia. Per review of previous assessments  Per TTS Staff and [facility] EDP, please see below.  Per IVC patient was seen by his physician today. When he returned to [facility], he attempted to leave. He was brought back into the building by staff. He attempted to leave again and hit a glass door several times with his wheelchair. Patient then struck out at staff as they tried to get him to stop. Staff member Lucy Antigua took out IVC papers on patient.  Past Psychiatric History: Adjustment Disorder with  Mood  Risk to Self: Suicidal Ideation: No Suicidal Intent: No Is patient at risk for suicide?: No Suicidal Plan?: No Access to Means: No What has been your use of drugs/alcohol within the last 12 months?: None How many times?:  (Unknown) Other Self Harm Risks: Trying to hit glass door w/ wheelchair Triggers for Past Attempts: None known Intentional Self Injurious Behavior: None Risk to Others: Homicidal Ideation: No Thoughts of Harm  to Others:  (Pt trying to hit staff when they get near him.) Current Homicidal Intent:  (Unable to assess) Current Homicidal Plan: No Access to Homicidal Means: No Identified Victim: No one History  of harm to others?: Yes Assessment of Violence: On admission Violent Behavior Description: Trying to hit staff at nursing home. Does patient have access to weapons?: No Criminal Charges Pending?: No Does patient have a court date: No Prior Inpatient Therapy: Prior Inpatient Therapy: No Prior Therapy Dates: Unknown Prior Therapy Facilty/Provider(s): Unknown Reason for Treatment: Unknown Prior Outpatient Therapy: Prior Outpatient Therapy: No Prior Therapy Dates: N/A Prior Therapy Facilty/Provider(s): N/A Reason for Treatment: N/A Does patient have an ACCT team?: No Does patient have Intensive In-House Services?  : No Does patient have [facility] services? : No Does patient have P4CC services?: No  Past Medical History:  Past Medical History  Diagnosis Date  . Uncontrolled diabetes mellitus ([place])   . HTN (hypertension)   . History of DVT (deep vein thrombosis)   . Personal history of diabetic foot ulcer   . Peripheral vascular disease ([place])     a. Abnl ABIs 2014.  Marland Kitchen Peripheral neuropathy ([place]) 11/19/2011  . Varicose vein     legs  . Osteomyelitis of foot, right, acute ([place]) 08/30/2012  . Personal history of colonic adenomas 05/29/2013  . History of cocaine use   . PAF (paroxysmal atrial fibrillation) ([place])     a. Dx 11/2013 in setting of sepsis/foot infection.  . Tobacco abuse   . H/O ETOH abuse   . Dysrhythmia     afib last admission  . Seasonal allergies   . Adjustment disorder with depressed mood     Past Surgical History  Procedure Laterality Date  . Amputation  04/21/2011    Procedure: AMPUTATION RAY;  Surgeon: Newt Minion, MD;  Location: [place];  Service: Orthopedics;  Laterality: Right;  Rt foot 2nd ray ampt  . I&d extremity  05/03/2011    Procedure: IRRIGATION AND DEBRIDEMENT EXTREMITY;  Surgeon: Newt Minion, MD;  Location: [place];  Service: Orthopedics;  Laterality: Right;  Irrigation and Debridement Right Foot and Place Acell Xenograft  . Toe amputation  04/24/2012     great toe   right foot  . Amputation  04/24/2012    Procedure: AMPUTATION RAY;  Surgeon: Newt Minion, MD;  Location: [place];  Service: Orthopedics;  Laterality: Right;  Right foot 1st ray amputation  . Amputation Left 04/23/2013    Procedure: AMPUTATION RAY;  Surgeon: Newt Minion, MD;  Location: [place];  Service: Orthopedics;  Laterality: Left;  Left Foot 5th Ray Amputation  . Colonoscopy    . Amputation Left 11/21/2013    Procedure: AMPUTATION RAY;  Surgeon: Newt Minion, MD;  Location: [place];  Service: Orthopedics;  Laterality: Left;  Left Foot 1st & 2nd Ray Amputation  . Amputation Right 12/04/2013    Procedure: AMPUTATION BELOW KNEE;  Surgeon: Marianna Payment, MD;  Location: [place];  Service: Orthopedics;  Laterality: Right;  . Amputation Left 12/29/2013    Procedure: LEFT AMPUTATION BELOW KNEE;  Surgeon: Marianna Payment, MD;  Location: [place];  Service: Orthopedics;  Laterality: Left;  . Amputation Right 04/15/2015    Procedure: AMPUTATION ABOVE KNEE, RIGHT;  Surgeon: Newt Minion, MD;  Location: [place];  Service: Orthopedics;  Laterality: Right;   Family History:  Family History  Problem Relation Age of Onset  . Diabetes Mother   . Stroke Brother   .  Colon cancer Neg Hx    Family Psychiatric  History: unretrievable Social History:  History  Alcohol Use  . 3.0 - 3.6 oz/week  . 5-6 Standard drinks or equivalent per week    Comment: t states he hs rank any alcholol rcently.  drinks off and on     History  Drug Use No    Comment: ast use of cocane an marjuana. denies    Social History   Social History  . Marital Status: Single    Spouse Name: N/A  . Number of Children: N/A  . Years of Education: N/A   Social History Main Topics  . Smoking status: Current Every Day Smoker -- 0.50 packs/day for 30 years    Types: Cigarettes    Start date: 06/13/1983  . Smokeless tobacco: Never Used  . Alcohol Use: 3.0 - 3.6 oz/week    5-6 Standard drinks or equivalent per week      Comment: t states he hs rank any alcholol rcently.  drinks off and on  . Drug Use: No     Comment: ast use of cocane an marjuana. denies  . Sexual Activity: Yes    Birth Control/ Protection: Injection     Comment: on patch   Other Topics Concern  . None   Social History Narrative   Additional Social History:    Allergies:  No Known Allergies  Labs:  Results for orders placed or performed during the hospital encounter of 08/16/15 (from the past 48 hour(s))  CBG monitoring, ED     Status: Abnormal   Collection Time: 08/16/15  6:08 PM  Result Value Ref Range   Glucose-Capillary 126 (H) 65 - 99 mg/dL  CBC with Differential/Platelet     Status: None   Collection Time: 08/16/15  6:50 PM  Result Value Ref Range   WBC 6.5 4.0 - 10.5 K/uL   RBC 5.04 4.22 - 5.81 MIL/uL   Hemoglobin 14.4 13.0 - 17.0 g/dL   HCT 43.0 39.0 - 52.0 %   MCV 85.3 78.0 - 100.0 fL   MCH 28.6 26.0 - 34.0 pg   MCHC 33.5 30.0 - 36.0 g/dL   RDW 15.3 11.5 - 15.5 %   Platelets 246 150 - 400 K/uL   Neutrophils Relative % 49 %   Neutro Abs 3.2 1.7 - 7.7 K/uL   Lymphocytes Relative 38 %   Lymphs Abs 2.5 0.7 - 4.0 K/uL   Monocytes Relative 8 %   Monocytes Absolute 0.5 0.1 - 1.0 K/uL   Eosinophils Relative 4 %   Eosinophils Absolute 0.3 0.0 - 0.7 K/uL   Basophils Relative 1 %   Basophils Absolute 0.1 0.0 - 0.1 K/uL  Comprehensive metabolic panel     Status: Abnormal   Collection Time: 08/16/15  6:50 PM  Result Value Ref Range   Sodium 147 (H) 135 - 145 mmol/L   Potassium 2.8 (L) 3.5 - 5.1 mmol/L   Chloride 110 101 - 111 mmol/L   CO2 29 22 - 32 mmol/L   Glucose, Bld 105 (H) 65 - 99 mg/dL   BUN 7 6 - 20 mg/dL   Creatinine, Ser 1.08 0.61 - 1.24 mg/dL   Calcium 9.1 8.9 - 10.3 mg/dL   Total Protein 7.5 6.5 - 8.1 g/dL   Albumin 2.8 (L) 3.5 - 5.0 g/dL   AST 23 15 - 41 U/L   ALT 17 17 - 63 U/L   Alkaline Phosphatase 57 38 - 126 U/L   Total  Bilirubin 0.5 0.3 - 1.2 mg/dL   GFR calc non Af Amer >60 >60  mL/min   GFR calc Af Amer >60 >60 mL/min    Comment: (NOTE) The eGFR has been calculated using the CKD EPI equation. This calculation has not been validated in all clinical situations. eGFR's persistently <60 mL/min signify possible Chronic Kidney Disease.    Anion gap 8 5 - 15  Ethanol     Status: None   Collection Time: 08/16/15  6:50 PM  Result Value Ref Range   Alcohol, Ethyl (B) <5 <5 mg/dL    Comment:        LOWEST DETECTABLE LIMIT FOR SERUM ALCOHOL IS 5 mg/dL FOR MEDICAL PURPOSES ONLY   Urinalysis, Routine w reflex microscopic (not at [facility])     Status: Abnormal   Collection Time: 08/16/15  7:23 PM  Result Value Ref Range   Color, Urine YELLOW YELLOW   APPearance CLOUDY (A) CLEAR   Specific Gravity, Urine 1.021 1.005 - 1.030   pH 5.5 5.0 - 8.0   Glucose, UA NEGATIVE NEGATIVE mg/dL   Hgb urine dipstick LARGE (A) NEGATIVE   Bilirubin Urine NEGATIVE NEGATIVE   Ketones, ur NEGATIVE NEGATIVE mg/dL   Protein, ur >300 (A) NEGATIVE mg/dL   Nitrite NEGATIVE NEGATIVE   Leukocytes, UA NEGATIVE NEGATIVE  Urine rapid drug screen (hosp performed)     Status: None   Collection Time: 08/16/15  7:23 PM  Result Value Ref Range   Opiates NONE DETECTED NONE DETECTED   Cocaine NONE DETECTED NONE DETECTED   Benzodiazepines NONE DETECTED NONE DETECTED   Amphetamines NONE DETECTED NONE DETECTED   Tetrahydrocannabinol NONE DETECTED NONE DETECTED   Barbiturates NONE DETECTED NONE DETECTED    Comment:        DRUG SCREEN FOR MEDICAL PURPOSES ONLY.  IF CONFIRMATION IS NEEDED FOR ANY PURPOSE, NOTIFY LAB WITHIN 5 DAYS.        LOWEST DETECTABLE LIMITS FOR URINE DRUG SCREEN Drug Class       Cutoff (ng/mL) Amphetamine      1000 Barbiturate      200 Benzodiazepine   384 Tricyclics       665 Opiates          300 Cocaine          300 THC              50   Urine microscopic-add on     Status: Abnormal   Collection Time: 08/16/15  7:23 PM  Result Value Ref Range   Squamous Epithelial /  LPF 0-5 (A) NONE SEEN   WBC, UA 0-5 0 - 5 WBC/hpf   RBC / HPF TOO NUMEROUS TO COUNT 0 - 5 RBC/hpf   Bacteria, UA RARE (A) NONE SEEN   Casts HYALINE CASTS (A) NEGATIVE    Comment: GRANULAR CAST  CBG monitoring, ED     Status: None   Collection Time: 08/16/15 11:27 PM  Result Value Ref Range   Glucose-Capillary 88 65 - 99 mg/dL  CBG monitoring, ED     Status: None   Collection Time: 08/17/15  9:26 AM  Result Value Ref Range   Glucose-Capillary 98 65 - 99 mg/dL  CBG monitoring, ED     Status: Abnormal   Collection Time: 08/17/15 12:13 PM  Result Value Ref Range   Glucose-Capillary 153 (H) 65 - 99 mg/dL  CBG monitoring, ED     Status: Abnormal   Collection Time: 08/17/15  5:46 PM  Result Value Ref Range   Glucose-Capillary 133 (H) 65 - 99 mg/dL  CBG monitoring, ED     Status: Abnormal   Collection Time: 08/17/15  7:25 PM  Result Value Ref Range   Glucose-Capillary 267 (H) 65 - 99 mg/dL   Comment 1 Notify RN   CBG monitoring, ED     Status: Abnormal   Collection Time: 08/17/15 10:02 PM  Result Value Ref Range   Glucose-Capillary 117 (H) 65 - 99 mg/dL    Current Facility-Administered Medications  Medication Dose Route Frequency Provider Last Rate Last Dose  . acidophilus (RISAQUAD) capsule 1 capsule  1 capsule Oral Daily Lacretia Leigh, MD   1 capsule at 08/17/15 1048  . aspirin EC tablet 162 mg  162 mg Oral Daily Lacretia Leigh, MD   162 mg at 08/17/15 1044  . atorvastatin (LIPITOR) tablet 40 mg  40 mg Oral q1800 Lacretia Leigh, MD   40 mg at 08/17/15 1939  . diltiazem (CARDIZEM CD) 24 hr capsule 180 mg  180 mg Oral Daily Lacretia Leigh, MD   180 mg at 08/17/15 1045  . enoxaparin (LOVENOX) injection 80 mg  80 mg Subcutaneous BID Lacretia Leigh, MD   80 mg at 08/17/15 2138  . hydrochlorothiazide (MICROZIDE) capsule 12.5 mg  12.5 mg Oral BID Lacretia Leigh, MD   12.5 mg at 08/17/15 2135  . insulin aspart (novoLOG) injection 0-9 Units  0-9 Units Subcutaneous TID WC Merrily Pew, MD   5  Units at 08/17/15 1934  . insulin glargine (LANTUS) injection 25 Units  25 Units Subcutaneous QHS Merrily Pew, MD   25 Units at 08/17/15 2137  . lisinopril (PRINIVIL,ZESTRIL) tablet 20 mg  20 mg Oral Daily Lacretia Leigh, MD   20 mg at 08/17/15 1048  . loratadine (CLARITIN) tablet 10 mg  10 mg Oral Daily Lacretia Leigh, MD   10 mg at 08/17/15 1044  . LORazepam (ATIVAN) tablet 1 mg  1 mg Oral Once Merrily Pew, MD   1 mg at 08/17/15 1514  . LORazepam (ATIVAN) tablet 1 mg  1 mg Oral Q4H PRN Lacretia Leigh, MD   1 mg at 08/17/15 2146  . multivitamin with minerals tablet 1 tablet  1 tablet Oral Daily Lacretia Leigh, MD   1 tablet at 08/17/15 1044  . ondansetron (ZOFRAN) tablet 4 mg  4 mg Oral Q8H PRN Lacretia Leigh, MD       Current Outpatient Prescriptions  Medication Sig Dispense Refill  . acetaminophen (TYLENOL) 325 MG tablet Take 2 tablets (650 mg total) by mouth every 6 (six) hours as needed. 60 tablet 1  . aspirin EC 81 MG tablet Take 2 tablets (162 mg total) by mouth daily. 60 tablet 0  . atorvastatin (LIPITOR) 40 MG tablet Take 1 tablet (40 mg total) by mouth daily. 30 tablet 11  . cetirizine (ZYRTEC) 10 MG tablet Take 1 tablet (10 mg total) by mouth daily. 90 tablet 3  . cholecalciferol (VITAMIN D) 1000 units tablet Take 1,000 Units by mouth daily.    Marland Kitchen diltiazem (CARDIZEM CD) 180 MG 24 hr capsule Take 1 capsule (180 mg total) by mouth daily.    Marland Kitchen enoxaparin (LOVENOX) 80 MG/0.8ML injection Inject 0.8 mLs (80 mg total) into the skin every 12 (twelve) hours. 0 Syringe   . gabapentin (NEURONTIN) 400 MG capsule Take 2 capsules (800 mg total) by mouth 3 (three) times daily. 180 capsule 0  . hydrochlorothiazide (MICROZIDE) 12.5 MG capsule Take 1 capsule (12.5 mg total)  by mouth 2 (two) times daily.    . insulin aspart (NOVOLOG) 100 UNIT/ML injection Inject 0-9 Units into the skin 3 (three) times daily with meals. Sensitive sliding scale (Patient taking differently: Inject 5 Units into the skin 3  (three) times daily with meals. Inject 5 more units for CBG>150) 10 mL 11  . insulin glargine (LANTUS) 100 UNIT/ML injection Inject 25 units nightly. (Patient taking differently: Inject 25 Units into the skin at bedtime. Inject 25 units nightly.) 10 mL 6  . iron polysaccharides (NIFEREX) 150 MG capsule Take 1 capsule (150 mg total) by mouth daily. 30 capsule 0  . lisinopril (PRINIVIL,ZESTRIL) 20 MG tablet take 1 tablet by mouth once daily for blood pressure 90 tablet 2  . methocarbamol (ROBAXIN) 500 MG tablet Take 1 tablet (500 mg total) by mouth every 6 (six) hours as needed for muscle spasms. 90 tablet 0  . Multiple Vitamin (MULTIVITAMIN WITH MINERALS) TABS tablet Take 1 tablet by mouth daily. 30 tablet 0  . Probiotic Product (PROBIOTIC ACIDOPHILUS) CAPS Take 1 tablet by mouth daily.    Marland Kitchen SSD 1 % cream Apply 1 application topically daily as needed. Rash/irritation  0    Musculoskeletal: Strength & Muscle Tone: couldnt evaluate Gait & Station: couldnt evaluate Patient leans: couldnt evaluate  Psychiatric Specialty Exam: Review of Systems  Unable to perform ROS: dementia  Psychiatric/Behavioral:       Reported aggression, agitation and violent behavior    Blood pressure 144/99, pulse 69, temperature 98.5 F (36.9 C), temperature source Oral, resp. rate 18, SpO2 98 %.There is no weight on file to calculate BMI.  General Appearance: evaluate  Eye Contact::  Minimal  Speech:  Blocked  Volume:  Decreased  Mood:  Dysphoric  Affect:  Flat  Thought Process:  Disorganized  Orientation:  Other:  A & 0 0/3  Thought Content:  Negative  Suicidal Thoughts:  couldnt assess  Homicidal Thoughts:  couldnt assess  Memory:  couldnt assess  Judgement:  Poor  Insight:  Lacking  Psychomotor Activity:  Negative  Concentration:  Poor  Recall:  Poor  Fund of Knowledge:Poor  Language: Poor  Akathisia:  Negative  Handed:  Right  AIMS (if indicated):     Assets:  Social Support  ADL's:  Impaired   Cognition: Impaired,  Severe  Sleep:      Treatment Plan Summary: Plan Continue to monitor for patient safety, stabilization and deescalation of agitation and violent behavior. In regards to medication management recommendations of volatile behavior, per my attending Anikka Marsan MD continue with use of benzodiazepines as needed for agitation. Can use first or secondary generation antipsychotics but contraindicated in setting of prolonged QT interval or uncompensated tachyarrhythmias. Plan for disposition per [facility] EDP, Psychiatry to reevaluate in the AM.  Disposition: per [facility] EDP  SIMON,SPENCER E, PA-C 08/17/2015 10:41 PM  Agree with NP Note and Assessment as above

## 2015-08-17 NOTE — Assessment & Plan Note (Signed)
SNF - cont lipitor 40 mg daily; last LDL 114, HDL 38; assume he is non compliant wit this as well

## 2015-08-17 NOTE — ED Notes (Signed)
TTS at bedside. 

## 2015-08-17 NOTE — ED Notes (Signed)
Contacted [employer] at facility.  She wanted to know if patient was competent enough to return back to facility.  Spoke with our case worker and she stated she would follow up with me.

## 2015-08-17 NOTE — Progress Notes (Signed)
CSW met with patient at bedside to check in. Sitter was present. Patient was cooperative. Patient states he has no questions for CSW.  Sitter states that patient has been cooperative and well behaved for the past 2 hours. CSW will continue to follow up. Per 1st shift CSW, Tammy Blakely/Liason came to evaluate patient today and stated that he is not welcomed back to facility at this time due to earlier behavior today, and that patient will need 24-48 hours free of chemical or physical restraints and no sitter in order to be considered for evaluation again from their facility/[facility].  Willette Brace [phone] ED CSW 08/17/2015 6:37 PM

## 2015-08-17 NOTE — Assessment & Plan Note (Signed)
Patient met SIRS criteria with tachycardia, tachypnea, leukocytosis, and lactic acidosis. He was thus started on vanc and zosynI D was also consulted, who thought AMS was most likely not due to meningitis, and was more likely results of hypoglycemia with concomitant seizures, other metabolic derangements, or 2/2 to stump infection at site of R AKA. Abx were changed to vanc and CTX. MRI and CT of R femur were unable to be obtained due to patient's agitation, however plain film of R femur showed possible osteo vs necrotizing fasciitis. Patient was then seen by ortho, who felt this was not osteo or necrotizing fasciitis, and recommended continuing current antibiotic regimen and applying Santyl dressings to wound.

## 2015-08-17 NOTE — Progress Notes (Addendum)
CSW received telephone call from Achilles Dunk, Lewanda Rife for [facility] regarding patient. CSW informed Liason that patient had been cleared by psychiatry and ready for discharge.She stated the Director of Nursing at the facility would like a report on patient. She provided the contact number for nurse to call and give report, [phone]. CSW spoke with the nurse and provided her with the contact number to call and speak with the Director of Nursing. CSW gave nurse work cell number to call CSW after speaking with Director of Nursing at the facility.   Nurse called CSW and stated the Director of Nursing wanted to know if patient was competent to make his own decisions. Nurse stated Director of Nursing stated patient's sister placed him at the facility last Thursday. Nurse noted no concerns other than patient throwing himself on the floor and placed in restraints. Nurse stated the Director of Nursing at the facility stated that patient throws himself on the floor there as well.   CSW received a call from [place] regarding medication changes or recommendations. CSW staffed with NP and no medication changes have been made. NP stated when asked, patient does not know why he was pushing his wheelchair in the door.   Liason stated she will wait for a call from the Director of Nursing and call CSW back.  2:06pm- CSW received call back from [facility], [place]. She stated she will need to come and see patient before they make a decision for patient to return. She stated she would be here in around one hour.   CSW received a call from nurse stating patient had to received Geodon and Ativan. Achilles Dunk, [facility] came to evalutate patient and she stated they will not take patient as he is at this point and that patient would need twenty-four to forty-eight hours free from chemical or physical restraints and no sitter. She stated once this occurs, she will come and evaluate patient  again.  Genice Rouge [phone] ED CSW  08/17/2015 2:05 PM

## 2015-08-17 NOTE — Assessment & Plan Note (Signed)
SNF - cont neurontin 800 mg TID

## 2015-08-17 NOTE — Assessment & Plan Note (Signed)
SNF - Lantus and Sensitive sliding scale with meals and at bedtime; a1c 9.9, non compliant ; will titrate 1.

## 2015-08-17 NOTE — Assessment & Plan Note (Signed)
[facility] - on dlit for rate ; was on xarelto but is now on lovenox for PE and ASA 162 mg, both will servea sprophylaxis for now

## 2015-08-17 NOTE — Assessment & Plan Note (Signed)
Rec f/u in 3-6 mon to rule out malignancy

## 2015-08-17 NOTE — Assessment & Plan Note (Signed)
Later in afternoon- pt is at door screaming with 2 police officers present, male staff , nursing; I wrote for aTIVAN IM 1 MG TO BE GIVEN; pt will be going to ED for behavoirs unsafe in SNF for other pt's as well as the pt

## 2015-08-17 NOTE — Assessment & Plan Note (Signed)
ETOH, cocaine, tobacco

## 2015-08-17 NOTE — BH Assessment (Signed)
Tele Assessment Note   Steven [name] is an 55 y.o. male.  -Clinician reviewed Dr. Ayesha Rumpf note.  Patient here with increased agitation and violent behavior at the nursing home. Per the records, patient attempted to leave the facility after being seen by their physician. Patient was placed on an involuntary commitment and is not allowed to return to nursing home. The nursing home was contacted by the patient's nurse and they state that the patient has not had any recent fever or falls. No medications were given prior to arrival and patient transported by EMS.  Patient had been given ativan to help with agitation.  Patient was not able to talk to this clinician.  Patient has not had any previous [facility] assessment to refer to .  Patient looked away when clinician attempted to assess him.  Per IVC patient was seen by his physician today.  When he returned to [facility], he attempted to leave.  He was brought back into the building by staff.  He attempted to leave again and hit a glass door several times with his wheelchair.  Patient then struck out at staff as they tried to get him to stop.  Staff member Lucy Antigua took out IVC papers on patient.  -Per Patriciaann Clan, PA patient need to be seen by psychiatry in AM on 03/07.  Patient needs to have 1st opinion completed.   Diagnosis: Dementia  Past Medical History:  Past Medical History  Diagnosis Date  . Uncontrolled diabetes mellitus ([place])   . HTN (hypertension)   . History of DVT (deep vein thrombosis)   . Personal history of diabetic foot ulcer   . Peripheral vascular disease ([place])     a. Abnl ABIs 2014.  Marland Kitchen Peripheral neuropathy ([place]) 11/19/2011  . Varicose vein     legs  . Osteomyelitis of foot, right, acute ([place]) 08/30/2012  . Personal history of colonic adenomas 05/29/2013  . History of cocaine use   . PAF (paroxysmal atrial fibrillation) ([place])     a. Dx 11/2013 in setting of sepsis/foot infection.  . Tobacco abuse   . H/O ETOH  abuse   . Dysrhythmia     afib last admission  . Seasonal allergies   . Adjustment disorder with depressed mood     Past Surgical History  Procedure Laterality Date  . Amputation  04/21/2011    Procedure: AMPUTATION RAY;  Surgeon: Newt Minion, MD;  Location: [place];  Service: Orthopedics;  Laterality: Right;  Rt foot 2nd ray ampt  . I&d extremity  05/03/2011    Procedure: IRRIGATION AND DEBRIDEMENT EXTREMITY;  Surgeon: Newt Minion, MD;  Location: [place];  Service: Orthopedics;  Laterality: Right;  Irrigation and Debridement Right Foot and Place Acell Xenograft  . Toe amputation  04/24/2012    great toe   right foot  . Amputation  04/24/2012    Procedure: AMPUTATION RAY;  Surgeon: Newt Minion, MD;  Location: [place];  Service: Orthopedics;  Laterality: Right;  Right foot 1st ray amputation  . Amputation Left 04/23/2013    Procedure: AMPUTATION RAY;  Surgeon: Newt Minion, MD;  Location: [place];  Service: Orthopedics;  Laterality: Left;  Left Foot 5th Ray Amputation  . Colonoscopy    . Amputation Left 11/21/2013    Procedure: AMPUTATION RAY;  Surgeon: Newt Minion, MD;  Location: [place];  Service: Orthopedics;  Laterality: Left;  Left Foot 1st & 2nd Ray Amputation  . Amputation Right 12/04/2013  Procedure: AMPUTATION BELOW KNEE;  Surgeon: Marianna Payment, MD;  Location: [place];  Service: Orthopedics;  Laterality: Right;  . Amputation Left 12/29/2013    Procedure: LEFT AMPUTATION BELOW KNEE;  Surgeon: Marianna Payment, MD;  Location: [place];  Service: Orthopedics;  Laterality: Left;  . Amputation Right 04/15/2015    Procedure: AMPUTATION ABOVE KNEE, RIGHT;  Surgeon: Newt Minion, MD;  Location: [place];  Service: Orthopedics;  Laterality: Right;    Family History:  Family History  Problem Relation Age of Onset  . Diabetes Mother   . Stroke Brother   . Colon cancer Neg Hx     Social History:  reports that he has been smoking Cigarettes.  He started smoking about 32 years ago. He  has a 15 pack-year smoking history. He has never used smokeless tobacco. He reports that he drinks about 3.0 - 3.6 oz of alcohol per week. He reports that he does not use illicit drugs.  Additional Social History:  Alcohol / Drug Use Pain Medications: See PTA medication list Prescriptions: See PTA medication list Over the Counter: See PTA medication list History of alcohol / drug use?: No history of alcohol / drug abuse (unable to assess)  CIWA: CIWA-Ar BP: 148/91 mmHg Pulse Rate: 84 COWS:    PATIENT STRENGTHS: (choose at least two) Average or above average intelligence Supportive family/friends  Allergies: No Known Allergies  Home Medications:  (Not in a hospital admission)  OB/GYN Status:  No LMP for male patient.  General Assessment Data Location of Assessment: [facility] ED TTS Assessment: In system Is this a Tele or Face-to-Face Assessment?: Face-to-Face Is this an Initial Assessment or a Re-assessment for this encounter?: Initial Assessment Marital status: Single Is patient pregnant?: No Pregnancy Status: No Living Arrangements: Other (Comment) (Lives at [facility]) Can pt return to current living arrangement?: No Admission Status: Involuntary Is patient capable of signing voluntary admission?: No Referral Source: Other ([facility] staff) Insurance type: MCD     Crisis Care Plan Living Arrangements: Other (Comment) (Lives at [facility]) Name of Psychiatrist: Unknown Name of Therapist: Unknown  Education Status Is patient currently in school?: No  Risk to self with the past 6 months Suicidal Ideation: No Has patient been a risk to self within the past 6 months prior to admission? : No Suicidal Intent: No Has patient had any suicidal intent within the past 6 months prior to admission? : No Is patient at risk for suicide?: No Suicidal Plan?: No Has patient had any suicidal plan within the past 6 months prior to admission? : No Access to Means:  No What has been your use of drugs/alcohol within the last 12 months?: None Previous Attempts/Gestures: No How many times?:  (Unknown) Other Self Harm Risks: Trying to hit glass door w/ wheelchair Triggers for Past Attempts: None known Intentional Self Injurious Behavior: None Family Suicide History: No Recent stressful life event(s): Other (Comment) (UTA) Persecutory voices/beliefs?:  (UTA) Depression:  (Unable to assess) Depression Symptoms: Feeling angry/irritable Substance abuse history and/or treatment for substance abuse?:  (Unknown) Suicide prevention information given to non-admitted patients: Not applicable  Risk to Others within the past 6 months Homicidal Ideation: No Does patient have any lifetime risk of violence toward others beyond the six months prior to admission? : Unknown Thoughts of Harm to Others:  (Pt trying to hit staff when they get near him.) Current Homicidal Intent:  (Unable to assess) Current Homicidal Plan: No Access to Homicidal Means: No Identified  Victim: No one History of harm to others?: Yes Assessment of Violence: On admission Violent Behavior Description: Trying to hit staff at nursing home. Does patient have access to weapons?: No Criminal Charges Pending?: No Does patient have a court date: No Is patient on probation?: No  Psychosis Hallucinations: None noted Delusions: None noted  Mental Status Report Appearance/Hygiene: Disheveled, In hospital gown (Bilateral leg amputee) Eye Contact: Unable to Assess Motor Activity: Restlessness Speech: Unable to assess (Pt would not speak) Level of Consciousness: Drowsy, Sedated Mood: Anxious, Helpless, Irritable Affect: Irritable Anxiety Level: Moderate Thought Processes: Unable to Assess Judgement: Unable to Assess Orientation: Unable to assess Obsessive Compulsive Thoughts/Behaviors: Unable to Assess  Cognitive Functioning Concentration: Unable to Assess Memory: Unable to Assess IQ:  Average Insight: Unable to Assess Impulse Control: Poor Appetite: Poor (Unknown) Weight Loss: 0 Weight Gain: 0 Sleep: Unable to Assess Total Hours of Sleep:  (Unknown) Vegetative Symptoms: Unable to Assess  ADLScreening [facility] Assessment Services) Patient's cognitive ability adequate to safely complete daily activities?: Yes Patient able to express need for assistance with ADLs?: Yes Independently performs ADLs?: No  Prior Inpatient Therapy Prior Inpatient Therapy: No Prior Therapy Dates: Unknown Prior Therapy Facilty/Provider(s): Unknown Reason for Treatment: Unknown  Prior Outpatient Therapy Prior Outpatient Therapy: No Prior Therapy Dates: N/A Prior Therapy Facilty/Provider(s): N/A Reason for Treatment: N/A Does patient have an ACCT team?: No Does patient have Intensive In-House Services?  : No Does patient have [facility] services? : No Does patient have P4CC services?: No  ADL Screening (condition at time of admission) Patient's cognitive ability adequate to safely complete daily activities?: Yes Is the patient deaf or have difficulty hearing?: No Does the patient have difficulty seeing, even when wearing glasses/contacts?: Yes Does the patient have difficulty concentrating, remembering, or making decisions?: Yes Patient able to express need for assistance with ADLs?: Yes Does the patient have difficulty dressing or bathing?: Yes Independently performs ADLs?: No Communication: Needs assistance Is this a change from baseline?: Pre-admission baseline Dressing (OT): Needs assistance Is this a change from baseline?: Pre-admission baseline Grooming: Needs assistance Is this a change from baseline?: Pre-admission baseline Feeding: Independent Bathing: Needs assistance Is this a change from baseline?: Pre-admission baseline Toileting: Needs assistance Is this a change from baseline?: Pre-admission baseline In/Out Bed: Needs assistance Is this a change from baseline?:  Pre-admission baseline Walks in Home: Dependent Is this a change from baseline?: Pre-admission baseline Does the patient have difficulty walking or climbing stairs?: Yes (Bi-lateral amputee) Weakness of Legs: Both (Bi-lateral amputee) Weakness of Arms/Hands: None       Abuse/Neglect Assessment (Assessment to be complete while patient is alone) Physical Abuse: Denies Verbal Abuse: Denies Sexual Abuse: Denies Exploitation of patient/patient's resources: Denies Self-Neglect: Denies     Regulatory affairs officer (For Healthcare) Does patient have an advance directive?: No Would patient like information on creating an advanced directive?: No - patient declined information    Additional Information 1:1 In Past 12 Months?: No CIRT Risk: No Elopement Risk: Yes Does patient have medical clearance?: Yes     Disposition:  Disposition Initial Assessment Completed for this Encounter: Yes Disposition of Patient: Other dispositions Other disposition(s): Other (Comment) (Pt to be reviewed by PA)  Curlene Dolphin Ray 08/17/2015 1:30 AM

## 2015-08-17 NOTE — Assessment & Plan Note (Signed)
Patient was found to have multiple pulmonary emboli on CTA on second day of admission. He was started on heparin drip, and continued to oxygenate well on RA. As he remained stable without hypoxemia, he was transitioned to therapeutic dose Lovenox after 5 days of heparin drip.  SNF - continue lovenox for now

## 2015-08-17 NOTE — ED Provider Notes (Addendum)
9:26 PM Pt seen by me and with episodes of agitation-- prn ativan ordered, psych will need to see to recommend meds-spoke with social work, and pts facility ( [facility]) to re-evaluate once mood stabilized   11:10 PM ED ECG REPORT   Date: 08/17/2015  Rate: 89  Rhythm: normal sinus rhythm  QRS Axis: normal  Intervals: normal  ST/T Wave abnormalities: normal  Conduction Disutrbances:pvcs  Narrative Interpretation:   Old EKG Reviewed: none available  I have personally reviewed the EKG tracing and agree with the computerized printout as noted.  Lacretia Leigh, MD 08/17/15 2129  Patient has no signs of prolonged QT at this time and he is in sinus rhythm.  Lacretia Leigh, MD 08/17/15 [phone]

## 2015-08-17 NOTE — Assessment & Plan Note (Signed)
SNF - 2/2 PVD and tob and cocaine abuse ; encourage abstinence

## 2015-08-17 NOTE — ED Notes (Signed)
Patient has calmed down. Pt continues to be mildly restless but not be abusive to staff or attempting to climb out of bed. Performed patient hygiene with changing the sheets, incont pads, and diaper. Applied moisturizing cream to his buttocks to prevent skin breakdown.

## 2015-08-18 ENCOUNTER — Emergency Department (HOSPITAL_COMMUNITY): Payer: Medicaid Other

## 2015-08-18 ENCOUNTER — Encounter (HOSPITAL_COMMUNITY): Payer: Self-pay | Admitting: Nurse Practitioner

## 2015-08-18 DIAGNOSIS — F4324 Adjustment disorder with disturbance of conduct: Secondary | ICD-10-CM | POA: Diagnosis not present

## 2015-08-18 DIAGNOSIS — F329 Major depressive disorder, single episode, unspecified: Secondary | ICD-10-CM | POA: Insufficient documentation

## 2015-08-18 DIAGNOSIS — F32A Depression, unspecified: Secondary | ICD-10-CM

## 2015-08-18 HISTORY — DX: Depression, unspecified: F32.A

## 2015-08-18 LAB — CBG MONITORING, ED
GLUCOSE-CAPILLARY: 58 mg/dL — AB (ref 65–99)
GLUCOSE-CAPILLARY: 275 mg/dL — AB (ref 65–99)
Glucose-Capillary: 101 mg/dL — ABNORMAL HIGH (ref 65–99)
Glucose-Capillary: 121 mg/dL — ABNORMAL HIGH (ref 65–99)

## 2015-08-18 LAB — URINE CULTURE: CULTURE: NO GROWTH

## 2015-08-18 MED ORDER — POTASSIUM CHLORIDE CRYS ER 20 MEQ PO TBCR
40.0000 meq | EXTENDED_RELEASE_TABLET | Freq: Once | ORAL | Status: AC
  Administered 2015-08-18: 40 meq via ORAL
  Filled 2015-08-18: qty 2

## 2015-08-18 MED ORDER — TRAZODONE HCL 50 MG PO TABS
50.0000 mg | ORAL_TABLET | Freq: Every day | ORAL | Status: DC
  Administered 2015-08-18 – 2015-08-22 (×3): 50 mg via ORAL
  Filled 2015-08-18 (×4): qty 1

## 2015-08-18 MED ORDER — NYSTATIN 100000 UNIT/GM EX POWD
Freq: Three times a day (TID) | CUTANEOUS | Status: DC
  Administered 2015-08-18 – 2015-08-23 (×10): via TOPICAL
  Filled 2015-08-18 (×2): qty 15

## 2015-08-18 MED ORDER — CARBAMAZEPINE 200 MG PO TABS
200.0000 mg | ORAL_TABLET | Freq: Two times a day (BID) | ORAL | Status: DC
  Administered 2015-08-18 – 2015-08-20 (×5): 200 mg via ORAL
  Filled 2015-08-18 (×8): qty 1

## 2015-08-18 NOTE — ED Notes (Signed)
Patient transported to X-ray 

## 2015-08-18 NOTE — ED Notes (Signed)
Pt is becoming agitated and wanting to get out of bed. Spoke with Alania-RN, pt is due for more meds. When RN is available-she will administer.

## 2015-08-18 NOTE — ED Provider Notes (Signed)
I examined the patient. He has no complains. He is calm, unrestrained. I have requested SW to ensure they are working on dispo. TTS consulted as wel to ensure psych sees him and puts in recs for d/c vs. Admit. If d/c, we will ask if he needs to be started on any additional meds.  Filed Vitals:   08/18/15 0037 08/18/15 0702  BP: 165/91 150/67  Pulse: 86 60  Temp: 97.2 F (36.2 C) 97.3 F (36.3 C)  Resp: 20 13     Alby Schwabe, MD 08/18/15 [phone]

## 2015-08-18 NOTE — ED Provider Notes (Signed)
Pt confused, trying to get out of bed, nonviolent. Patient placed in a belt restraint to keep him from sliding out of bed.  Quintella Reichert, MD 08/18/15 2320

## 2015-08-18 NOTE — ED Notes (Addendum)
Posey belt removed.  Sitter at bedside.

## 2015-08-18 NOTE — ED Notes (Signed)
Psych team at bedside .

## 2015-08-18 NOTE — ED Notes (Signed)
Pt attempting to slide to end of bed to get off. RN and sitter placed pt back in bed.

## 2015-08-18 NOTE — Consult Note (Signed)
Psychiatric Specialty Exam: Physical Exam  Review of Systems  Unable to perform ROS: mental acuity    Blood pressure 161/98, pulse 71, temperature 97.3 F (36.3 C), temperature source Oral, resp. rate 14, SpO2 98 %.There is no weight on file to calculate BMI.  General Appearance: Casual and Disheveled  Eye Contact::  Minimal  Speech:  Blocked and not talking , nods his head at times.  Volume:  not talking  Mood:  Depressed and answered yes when asked if he is depressed.  Affect:  Congruent, Depressed and Flat  Thought Process:  did not speak at this time.  Orientation:  Other:  unable to obtain  Thought Content:  unable to obtain  Suicidal Thoughts:  unable to obtain  Homicidal Thoughts:  unable to obtain  Memory:  Immediate;   unable to obtain Recent;   unable to btain, patient is not talking Remote;   unable to obtain  Judgement:  Poor  Insight:  unable to obtain  Psychomotor Activity:  Psychomotor Retardation  Concentration:  Fair  Recall:  unable to obtain  Fund of Knowledge:  unable to obtain  Language:  none  Akathisia:  No  Handed:  Right  AIMS (if indicated):     Assets:  Desire for Improvement Housing Social Support Transportation  ADL's:  Intact  Cognition:  WNL  Sleep:      Patient was seen this morning and refuses to speak.  Patient only answered yes when asked if he is depressed.  He could not respond to question and occassionally nods his head.  Patient made no eye contact and was not participating with the interview.  Patient looked away from providers as he was pressed form answers.  We have replaced his low Potassium  and he has been started on a mood stabilizer and antidepressant.   He has been accepted for admission for stabilization and treatment of Depression.  See Collateral information from his sister.  Adjustment disorder with disturbance of conduct   Plan: Repeat BMP in am, seek placement at any facility with available bed.  Charmaine Downs    PMHNP-BC Patient seen face-to-face for psychiatric evaluation, chart reviewed and case discussed with the physician extender and developed treatment plan. Reviewed the information documented and agree with the treatment plan. Corena Pilgrim, MD

## 2015-08-18 NOTE — ED Notes (Addendum)
Pt still attempting to get out of bed, has stumps through bed railing. Sitter and RN placed pt back in bed with foot of bed elevated slightly. Pt still sliding to edge of bed and getting in b/w the railings. MD made aware, posey belt ordered.

## 2015-08-18 NOTE — Progress Notes (Signed)
CSW staffed case with Asst. Social Work Mudlogger. CSW and Asst. Director went in to see patient this morning. CSW obtained information from Nurse Tech who states patient had been sleep and cooperative. Nurse Tech informed the Nurse that patient was in a posey. Nurse stated they would take the posey off of patient.   CSW called and spoke with patient's sister, [name] [id number] to see if she could come and visit patient. She stated she was in [facility] and she was admitted yesterday. She stated she had a knee replacement.   She reports patient was previously at [facility] due to an overdose of insulin and she states patient had a high substance of cocaine in his system. CSW inquired about patient not verbalizing since being hospitalized. She reports patient began communicating less after the overdose. She reports she does not know if he is "faking" or not.   Genice Rouge [id number] ED CSW 08/19/2015 8:14 AM

## 2015-08-18 NOTE — ED Notes (Signed)
Pt given 24oz orange juice.

## 2015-08-18 NOTE — ED Notes (Signed)
Sitter

## 2015-08-18 NOTE — BH Assessment (Signed)
[place] Assessment Progress Note  Per Corena Pilgrim, MD, this pt requires psychiatric hospitalization at this time.  The following facilities have been contacted to seek placement for this pt, with results as noted:  Beds available, information sent, decision pending:  [facility]   At capacity:  Santiago Bumpers, [place] Triage Specialist [phone]

## 2015-08-19 ENCOUNTER — Encounter (HOSPITAL_COMMUNITY): Payer: Self-pay | Admitting: Family Medicine

## 2015-08-19 DIAGNOSIS — E160 Drug-induced hypoglycemia without coma: Secondary | ICD-10-CM | POA: Diagnosis not present

## 2015-08-19 DIAGNOSIS — F4321 Adjustment disorder with depressed mood: Secondary | ICD-10-CM | POA: Diagnosis not present

## 2015-08-19 DIAGNOSIS — E1169 Type 2 diabetes mellitus with other specified complication: Secondary | ICD-10-CM | POA: Diagnosis not present

## 2015-08-19 DIAGNOSIS — F4324 Adjustment disorder with disturbance of conduct: Secondary | ICD-10-CM | POA: Diagnosis not present

## 2015-08-19 DIAGNOSIS — T383X5A Adverse effect of insulin and oral hypoglycemic [antidiabetic] drugs, initial encounter: Secondary | ICD-10-CM

## 2015-08-19 LAB — BASIC METABOLIC PANEL
Anion gap: 9 (ref 5–15)
BUN: 7 mg/dL (ref 6–20)
CO2: 30 mmol/L (ref 22–32)
CREATININE: 0.98 mg/dL (ref 0.61–1.24)
Calcium: 9.2 mg/dL (ref 8.9–10.3)
Chloride: 101 mmol/L (ref 101–111)
Glucose, Bld: 38 mg/dL — CL (ref 65–99)
POTASSIUM: 3 mmol/L — AB (ref 3.5–5.1)
SODIUM: 140 mmol/L (ref 135–145)

## 2015-08-19 LAB — CBG MONITORING, ED
GLUCOSE-CAPILLARY: 79 mg/dL (ref 65–99)
GLUCOSE-CAPILLARY: 83 mg/dL (ref 65–99)
GLUCOSE-CAPILLARY: 154 mg/dL — AB (ref 65–99)
GLUCOSE-CAPILLARY: 127 mg/dL — AB (ref 65–99)
Glucose-Capillary: 86 mg/dL (ref 65–99)
Glucose-Capillary: 35 mg/dL — CL (ref 65–99)
Glucose-Capillary: 92 mg/dL (ref 65–99)

## 2015-08-19 MED ORDER — INSULIN GLARGINE 100 UNIT/ML ~~LOC~~ SOLN
10.0000 [IU] | Freq: Every day | SUBCUTANEOUS | Status: DC
  Administered 2015-08-19: 10 [IU] via SUBCUTANEOUS
  Filled 2015-08-19: qty 0.1

## 2015-08-19 MED ORDER — GLUCAGON HCL RDNA (DIAGNOSTIC) 1 MG IJ SOLR
1.0000 mg | Freq: Once | INTRAMUSCULAR | Status: AC
  Administered 2015-08-19: 1 mg via INTRAMUSCULAR

## 2015-08-19 MED ORDER — POTASSIUM CHLORIDE CRYS ER 20 MEQ PO TBCR
40.0000 meq | EXTENDED_RELEASE_TABLET | Freq: Once | ORAL | Status: AC
  Administered 2015-08-19: 40 meq via ORAL
  Filled 2015-08-19: qty 2

## 2015-08-19 MED ORDER — GLUCAGON HCL RDNA (DIAGNOSTIC) 1 MG IJ SOLR
INTRAMUSCULAR | Status: AC
  Filled 2015-08-19: qty 1

## 2015-08-19 NOTE — ED Notes (Signed)
Pt ate about 50% of food

## 2015-08-19 NOTE — ED Notes (Signed)
Bed: [id number] Expected date:  Expected time:  Means of arrival:  Comments: Hold for TCU 29

## 2015-08-19 NOTE — ED Notes (Signed)
NT fed patient, patient tolerated PO meds crushed in applesauce given by RN

## 2015-08-19 NOTE — ED Provider Notes (Signed)
Hypoglycemia this morning. Improved now. Also noted yesterday morning. May just be matter of decreasing lantus. Medicine consulted for medication recommendations. Appreciate help.  Virgel Manifold, MD 08/19/15 [phone]

## 2015-08-19 NOTE — Progress Notes (Signed)
CSW has referred patient to [facility], and [facility] in attempts to seek inpatient placement.  Willette Brace [id number] ED CSW 08/19/2015 7:18 PM

## 2015-08-19 NOTE — ED Notes (Signed)
Patient belonging bag locked in locker 29 in [place]

## 2015-08-19 NOTE — BH Assessment (Signed)
Dr. Darleene Cleaver and Reginold Agent, NP requested patient to be reassessed today. Writer attempted to complete patient's reassessment. Patient was not able to be awakened. Per nursing staff, patient given was recently given Ativan. Writer will attempt to reassess patient once he awakens.   Per Dr. Darleene Cleaver and Dr. Reginold Agent, NP, patient meets criteria for [facility] inpatient treatment. TTS has referred patient to several [facility] facilities.  Patient is under review @: [facility], [place], [place], [place]

## 2015-08-19 NOTE — ED Notes (Signed)
Patient advised "I'm itching all over".  Will page hospitalist.
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
## 2015-08-20 DIAGNOSIS — T383X5A Adverse effect of insulin and oral hypoglycemic [antidiabetic] drugs, initial encounter: Secondary | ICD-10-CM

## 2015-08-20 DIAGNOSIS — E1169 Type 2 diabetes mellitus with other specified complication: Secondary | ICD-10-CM

## 2015-08-20 DIAGNOSIS — F4324 Adjustment disorder with disturbance of conduct: Secondary | ICD-10-CM | POA: Diagnosis not present

## 2015-08-20 DIAGNOSIS — Z794 Long term (current) use of insulin: Secondary | ICD-10-CM

## 2015-08-20 DIAGNOSIS — E1165 Type 2 diabetes mellitus with hyperglycemia: Secondary | ICD-10-CM

## 2015-08-20 DIAGNOSIS — E160 Drug-induced hypoglycemia without coma: Secondary | ICD-10-CM

## 2015-08-20 LAB — CBG MONITORING, ED
GLUCOSE-CAPILLARY: 81 mg/dL (ref 65–99)
GLUCOSE-CAPILLARY: 106 mg/dL — AB (ref 65–99)
GLUCOSE-CAPILLARY: 148 mg/dL — AB (ref 65–99)
Glucose-Capillary: 68 mg/dL (ref 65–99)
Glucose-Capillary: 263 mg/dL — ABNORMAL HIGH (ref 65–99)

## 2015-08-20 MED ORDER — INSULIN ASPART 100 UNIT/ML ~~LOC~~ SOLN
5.0000 [IU] | Freq: Once | SUBCUTANEOUS | Status: AC
  Administered 2015-08-20: 5 [IU] via SUBCUTANEOUS
  Filled 2015-08-20: qty 1

## 2015-08-20 MED ORDER — DIPHENHYDRAMINE HCL 25 MG PO CAPS: 25.0000 mg | ORAL_CAPSULE | ORAL | Status: DC | PRN

## 2015-08-20 MED ORDER — DIVALPROEX SODIUM 500 MG PO DR TAB
500.0000 mg | DELAYED_RELEASE_TABLET | Freq: Two times a day (BID) | ORAL | Status: DC
Start: 2015-08-20 — End: 2015-08-23
  Administered 2015-08-21 – 2015-08-23 (×4): 500 mg via ORAL
  Filled 2015-08-20 (×5): qty 1

## 2015-08-20 MED ORDER — DIPHENHYDRAMINE HCL 25 MG PO CAPS: 50.0000 mg | ORAL_CAPSULE | Freq: Once | ORAL | Status: DC

## 2015-08-20 MED ORDER — POTASSIUM CHLORIDE CRYS ER 20 MEQ PO TBCR
40.0000 meq | EXTENDED_RELEASE_TABLET | Freq: Two times a day (BID) | ORAL | Status: DC
  Administered 2015-08-20 – 2015-08-21 (×2): 40 meq via ORAL
  Filled 2015-08-20 (×2): qty 2

## 2015-08-20 MED ORDER — INSULIN ASPART PROT & ASPART (70-30 MIX) 100 UNIT/ML ~~LOC~~ SUSP: 5.0000 [IU] | Freq: Once | SUBCUTANEOUS | Status: DC

## 2015-08-20 NOTE — Consult Note (Signed)
[facility] Consult   Reason for Consult:  Agitation  Referring Physician:  EDP Patient Identification: Steven [name] MRN:  [id number] Principal Diagnosis: Adjustment disorder with disturbance of conduct Diagnosis:   Patient Active Problem List   Diagnosis Date Noted  . Adjustment disorder with disturbance of conduct [F43.24] 08/17/2015    Priority: High  . Hypoglycemia due to insulin [E16.0, T38.3X5A] 08/19/2015  . Depression [F32.9] 08/18/2015  . Hypoglycemia, coma ([place]) [E15] 08/17/2015  . SIRS (systemic inflammatory response syndrome) (HCC) [R65.10] 08/17/2015  . Multiple pulmonary emboli ([place]) [I26.99] 08/17/2015  . Ulcer of amputation stump of lower extremity ([place]) [T87.89, L97.909] 08/17/2015  . Aggressive behavior of adult [F60.89] 08/17/2015  . Palliative care encounter [Z51.5]   . Goals of care, counseling/discussion [Z71.89]   . Dysphagia [R13.10]   . DNR (do not resuscitate) [Z66]   . Stump pain ([place]) [T87.89]   . Lung nodule seen on imaging study [R91.1] 08/04/2015  . Seizure ([place]) [R56.9]   . Tachycardia [R00.0]   . FUO (fever of unknown origin) [R50.9]   . 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 Polysubstance abuse [F19.10]   . Hypoglycemia [E16.2]   . Altered mental status [R41.82] 08/03/2015  . Patient cannot afford medications [Z59.6] 06/04/2015  . Paroxysmal a-fib ([place]) [I48.0]   . Adjustment disorder with depressed mood [F43.21]   . History of left below knee amputation ([place]) [S06.301] 04/22/2015  . Unilateral complete AKA ([place]) [Z89.619] 04/19/2015  . Dry gangrene ([place]) [I96]   . Right leg pain [M79.604] 04/14/2015  . Cough [R05] 08/12/2014  . Bilateral hand pain [id number], M79.642] 05/29/2014  . Acne [L70.9] 03/30/2014  . S/P bilateral BKA (below knee amputation) ([place]) [S01.093, Z89.511] 01/22/2014  . Cocaine abuse [F14.10] 12/05/2013  . Tobacco abuse [Z72.0] 12/04/2013  . Peripheral vascular disease ([place]) [I73.9]   . Atrial fibrillation  ([place]) [I48.91] 12/03/2013  . Rhinitis medicamentosa [J31.0, T48.5X1A] 07/02/2013  . History of colonic polyps [Z86.010] 05/29/2013  . Diabetic retinopathy ([place]) [E11.319] 12/25/2012  . HLD (hyperlipidemia) [E78.5] 12/08/2012  . Peripheral neuropathy ([place]) [G62.9] 11/19/2011  . ETOH abuse [F10.10] 03/29/2011  . HTN (hypertension) [I10] 12/22/2010  . Diabetes mellitus type II, uncontrolled ([place]) [E11.65] 07/18/2006  . VENOUS INSUFFICIENCY [I87.2] 07/18/2006    Total Time spent with patient: 30 minutes  Subjective:   Steven [name] is a 55 y.o. male patient admitted with agitation.  HPI:  55 yo male who presented to the ED with an increase in agitation.  He was trying to escape his nursing facility.  Today, he is having issues being redirected and was trying to get out of his bed without assistance, bilateral amputee.  Continues to be confused, blood glucose dropped to 30 and his was transferred from TCU to the medical side of the ED.  Past Psychiatric History: None  Risk to Self: Suicidal Ideation: No Suicidal Intent: No Is patient at risk for suicide?: No Suicidal Plan?: No Access to Means: No What has been your use of drugs/alcohol within the last 12 months?: None How many times?:  (Unknown) Other Self Harm Risks: Trying to hit glass door w/ wheelchair Triggers for Past Attempts: None known Intentional Self Injurious Behavior: None Risk to Others: Homicidal Ideation: No Thoughts of Harm to Others:  (Pt trying to hit staff when they get near him.) Current Homicidal Intent:  (Unable to assess) Current Homicidal Plan: No Access to Homicidal Means: No Identified Victim: No one History of harm to others?: Yes Assessment of Violence:  On admission Violent Behavior Description: Trying to hit staff at nursing home. Does patient have access to weapons?: No Criminal Charges Pending?: No Does patient have a court date: No Prior Inpatient Therapy: Prior Inpatient Therapy: No Prior  Therapy Dates: Unknown Prior Therapy Facilty/Provider(s): Unknown Reason for Treatment: Unknown Prior Outpatient Therapy: Prior Outpatient Therapy: No Prior Therapy Dates: N/A Prior Therapy Facilty/Provider(s): N/A Reason for Treatment: N/A Does patient have an ACCT team?: No Does patient have Intensive In-House Services?  : No Does patient have [facility] services? : No Does patient have P4CC services?: No  Past Medical History:  Past Medical History  Diagnosis Date  . Uncontrolled diabetes mellitus ([place])   . HTN (hypertension)   . History of DVT (deep vein thrombosis)   . Personal history of diabetic foot ulcer   . Peripheral vascular disease ([place])     a. Abnl ABIs 2014.  Marland Kitchen Peripheral neuropathy ([place]) 11/19/2011  . Varicose vein     legs  . Osteomyelitis of foot, right, acute ([place]) 08/30/2012  . Personal history of colonic adenomas 05/29/2013  . History of cocaine use   . PAF (paroxysmal atrial fibrillation) ([place])     a. Dx 11/2013 in setting of sepsis/foot infection.  . Tobacco abuse   . H/O ETOH abuse   . Dysrhythmia     afib last admission  . Seasonal allergies   . Adjustment disorder with depressed mood   . Depression 08/18/2015    Past Surgical History  Procedure Laterality Date  . Amputation  04/21/2011    Procedure: AMPUTATION RAY;  Surgeon: Newt Minion, MD;  Location: [facility];  Service: Orthopedics;  Laterality: Right;  Rt foot 2nd ray ampt  . I&d extremity  05/03/2011    Procedure: IRRIGATION AND DEBRIDEMENT EXTREMITY;  Surgeon: Newt Minion, MD;  Location: [facility];  Service: Orthopedics;  Laterality: Right;  Irrigation and Debridement Right Foot and Place Acell Xenograft  . Toe amputation  04/24/2012    great toe   right foot  . Amputation  04/24/2012    Procedure: AMPUTATION RAY;  Surgeon: Newt Minion, MD;  Location: [facility];  Service: Orthopedics;  Laterality: Right;  Right foot 1st ray amputation  . Amputation Left 04/23/2013    Procedure: AMPUTATION RAY;  Surgeon:  Newt Minion, MD;  Location: [facility];  Service: Orthopedics;  Laterality: Left;  Left Foot 5th Ray Amputation  . Colonoscopy    . Amputation Left 11/21/2013    Procedure: AMPUTATION RAY;  Surgeon: Newt Minion, MD;  Location: [facility];  Service: Orthopedics;  Laterality: Left;  Left Foot 1st & 2nd Ray Amputation  . Amputation Right 12/04/2013    Procedure: AMPUTATION BELOW KNEE;  Surgeon: Marianna Payment, MD;  Location: [facility];  Service: Orthopedics;  Laterality: Right;  . Amputation Left 12/29/2013    Procedure: LEFT AMPUTATION BELOW KNEE;  Surgeon: Marianna Payment, MD;  Location: [facility];  Service: Orthopedics;  Laterality: Left;  . Amputation Right 04/15/2015    Procedure: AMPUTATION ABOVE KNEE, RIGHT;  Surgeon: Newt Minion, MD;  Location: [facility];  Service: Orthopedics;  Laterality: Right;   Family History:  Family History  Problem Relation Age of Onset  . Diabetes Mother   . Stroke Brother   . Colon cancer Neg Hx    Family Psychiatric  History: None Social History:  History  Alcohol Use  . 3.0 - 3.6 oz/week  . 5-6 Standard drinks or equivalent per week  Comment: t states he hs rank any alcholol rcently.  drinks off and on     History  Drug Use No    Comment: ast use of cocane an marjuana. denies    Social History   Social History  . Marital Status: Single    Spouse Name: N/A  . Number of Children: N/A  . Years of Education: N/A   Social History Main Topics  . Smoking status: Current Every Day Smoker -- 0.50 packs/day for 30 years    Types: Cigarettes    Start date: 06/13/1983  . Smokeless tobacco: Never Used  . Alcohol Use: 3.0 - 3.6 oz/week    5-6 Standard drinks or equivalent per week     Comment: t states he hs rank any alcholol rcently.  drinks off and on  . Drug Use: No     Comment: ast use of cocane an marjuana. denies  . Sexual Activity: Yes    Birth Control/ Protection: Injection     Comment: on patch   Other Topics Concern  . None   Social History  Narrative   Additional Social History:    Allergies:  No Known Allergies  Labs:  Results for orders placed or performed during the hospital encounter of 08/16/15 (from the past 48 hour(s))  CBG monitoring, ED     Status: Abnormal   Collection Time: 08/18/15  6:05 PM  Result Value Ref Range   Glucose-Capillary 121 (H) 65 - 99 mg/dL  CBG monitoring, ED     Status: Abnormal   Collection Time: 08/18/15 10:08 PM  Result Value Ref Range   Glucose-Capillary 275 (H) 65 - 99 mg/dL  Basic metabolic panel     Status: Abnormal   Collection Time: 08/19/15  6:20 AM  Result Value Ref Range   Sodium 140 135 - 145 mmol/L   Potassium 3.0 (L) 3.5 - 5.1 mmol/L   Chloride 101 101 - 111 mmol/L   CO2 30 22 - 32 mmol/L   Glucose, Bld 38 (LL) 65 - 99 mg/dL    Comment: CRITICAL RESULT CALLED TO, READ BACK BY AND VERIFIED WITH: Sheffield Slider, RN [phone] 08/19/15 BY A NORMAN    BUN 7 6 - 20 mg/dL   Creatinine, Ser 0.98 0.61 - 1.24 mg/dL   Calcium 9.2 8.9 - 10.3 mg/dL   GFR calc non Af Amer >60 >60 mL/min   GFR calc Af Amer >60 >60 mL/min    Comment: (NOTE) The eGFR has been calculated using the CKD EPI equation. This calculation has not been validated in all clinical situations. eGFR's persistently <60 mL/min signify possible Chronic Kidney Disease.    Anion gap 9 5 - 15  CBG monitoring, ED     Status: None   Collection Time: 08/19/15  7:34 AM  Result Value Ref Range   Glucose-Capillary 79 65 - 99 mg/dL  POC CBG, ED     Status: None   Collection Time: 08/19/15  9:51 AM  Result Value Ref Range   Glucose-Capillary 86 65 - 99 mg/dL  CBG monitoring, ED     Status: Abnormal   Collection Time: 08/19/15 12:02 PM  Result Value Ref Range   Glucose-Capillary 35 (LL) 65 - 99 mg/dL   Comment 1 Notify RN   CBG monitoring, ED     Status: None   Collection Time: 08/19/15 12:36 PM  Result Value Ref Range   Glucose-Capillary 92 65 - 99 mg/dL  CBG monitoring, ED     Status:  None   Collection Time: 08/19/15   5:08 PM  Result Value Ref Range   Glucose-Capillary 83 65 - 99 mg/dL  CBG monitoring, ED     Status: Abnormal   Collection Time: 08/19/15  6:58 PM  Result Value Ref Range   Glucose-Capillary 154 (H) 65 - 99 mg/dL  CBG monitoring, ED     Status: Abnormal   Collection Time: 08/19/15 10:47 PM  Result Value Ref Range   Glucose-Capillary 127 (H) 65 - 99 mg/dL  CBG monitoring, ED     Status: None   Collection Time: 08/20/15  8:11 AM  Result Value Ref Range   Glucose-Capillary 68 65 - 99 mg/dL  CBG monitoring, ED     Status: None   Collection Time: 08/20/15  9:05 AM  Result Value Ref Range   Glucose-Capillary 81 65 - 99 mg/dL  CBG monitoring, ED     Status: Abnormal   Collection Time: 08/20/15 10:39 AM  Result Value Ref Range   Glucose-Capillary 106 (H) 65 - 99 mg/dL    Current Facility-Administered Medications  Medication Dose Route Frequency Provider Last Rate Last Dose  . acidophilus (RISAQUAD) capsule 1 capsule  1 capsule Oral Daily Lacretia Leigh, MD   1 capsule at 08/20/15 1258  . aspirin EC tablet 162 mg  162 mg Oral Daily Lacretia Leigh, MD   162 mg at 08/20/15 1258  . atorvastatin (LIPITOR) tablet 40 mg  40 mg Oral q1800 Lacretia Leigh, MD   40 mg at 08/19/15 2258  . carbamazepine (TEGRETOL) tablet 200 mg  200 mg Oral BID PC Kobie Whidby, MD   200 mg at 08/20/15 1259  . diltiazem (CARDIZEM CD) 24 hr capsule 180 mg  180 mg Oral Daily Lacretia Leigh, MD   180 mg at 08/20/15 1259  . diphenhydrAMINE (BENADRYL) capsule 25 mg  25 mg Oral Q4H PRN Rhetta Mura Schorr, NP      . enoxaparin (LOVENOX) injection 80 mg  80 mg Subcutaneous BID Lacretia Leigh, MD   80 mg at 08/20/15 1300  . hydrochlorothiazide (MICROZIDE) capsule 12.5 mg  12.5 mg Oral BID Lacretia Leigh, MD   12.5 mg at 08/20/15 1300  . insulin aspart (novoLOG) injection 0-9 Units  0-9 Units Subcutaneous TID WC Merrily Pew, MD   Stopped at 08/20/15 0911  . lisinopril (PRINIVIL,ZESTRIL) tablet 20 mg  20 mg Oral Daily Lacretia Leigh, MD   20 mg at 08/20/15 1259  . loratadine (CLARITIN) tablet 10 mg  10 mg Oral Daily Lacretia Leigh, MD   10 mg at 08/20/15 1300  . LORazepam (ATIVAN) tablet 1 mg  1 mg Oral Once Merrily Pew, MD   1 mg at 08/17/15 1514  . LORazepam (ATIVAN) tablet 1 mg  1 mg Oral Q4H PRN Lacretia Leigh, MD   1 mg at 08/20/15 1057  . multivitamin with minerals tablet 1 tablet  1 tablet Oral Daily Lacretia Leigh, MD   1 tablet at 08/20/15 1259  . nystatin (MYCOSTATIN/NYSTOP) topical powder   Topical TID Quintella Reichert, MD   Stopped at 08/20/15 1200  . ondansetron (ZOFRAN) tablet 4 mg  4 mg Oral Q8H PRN Lacretia Leigh, MD      . potassium chloride SA (K-DUR,KLOR-CON) CR tablet 40 mEq  40 mEq Oral BID Patrecia Pour, NP      . traZODone (DESYREL) tablet 50 mg  50 mg Oral QHS Corena Pilgrim, MD   50 mg at 08/20/15 0229   Current Outpatient Prescriptions  Medication Sig Dispense Refill  . acetaminophen (TYLENOL) 325 MG tablet Take 2 tablets (650 mg total) by mouth every 6 (six) hours as needed. 60 tablet 1  . aspirin EC 81 MG tablet Take 2 tablets (162 mg total) by mouth daily. 60 tablet 0  . atorvastatin (LIPITOR) 40 MG tablet Take 1 tablet (40 mg total) by mouth daily. 30 tablet 11  . cetirizine (ZYRTEC) 10 MG tablet Take 1 tablet (10 mg total) by mouth daily. 90 tablet 3  . cholecalciferol (VITAMIN D) 1000 units tablet Take 1,000 Units by mouth daily.    Marland Kitchen diltiazem (CARDIZEM CD) 180 MG 24 hr capsule Take 1 capsule (180 mg total) by mouth daily.    Marland Kitchen enoxaparin (LOVENOX) 80 MG/0.8ML injection Inject 0.8 mLs (80 mg total) into the skin every 12 (twelve) hours. 0 Syringe   . gabapentin (NEURONTIN) 400 MG capsule Take 2 capsules (800 mg total) by mouth 3 (three) times daily. 180 capsule 0  . hydrochlorothiazide (MICROZIDE) 12.5 MG capsule Take 1 capsule (12.5 mg total) by mouth 2 (two) times daily.    . insulin aspart (NOVOLOG) 100 UNIT/ML injection Inject 0-9 Units into the skin 3 (three) times daily with meals.  Sensitive sliding scale (Patient taking differently: Inject 5 Units into the skin 3 (three) times daily with meals. Inject 5 more units for CBG>150) 10 mL 11  . insulin glargine (LANTUS) 100 UNIT/ML injection Inject 25 units nightly. (Patient taking differently: Inject 25 Units into the skin at bedtime. Inject 25 units nightly.) 10 mL 6  . iron polysaccharides (NIFEREX) 150 MG capsule Take 1 capsule (150 mg total) by mouth daily. 30 capsule 0  . lisinopril (PRINIVIL,ZESTRIL) 20 MG tablet take 1 tablet by mouth once daily for blood pressure 90 tablet 2  . methocarbamol (ROBAXIN) 500 MG tablet Take 1 tablet (500 mg total) by mouth every 6 (six) hours as needed for muscle spasms. 90 tablet 0  . Multiple Vitamin (MULTIVITAMIN WITH MINERALS) TABS tablet Take 1 tablet by mouth daily. 30 tablet 0  . Probiotic Product (PROBIOTIC ACIDOPHILUS) CAPS Take 1 tablet by mouth daily.    Marland Kitchen SSD 1 % cream Apply 1 application topically daily as needed. Rash/irritation  0    Musculoskeletal: Strength & Muscle Tone: within normal limits Gait & Station: unable to stand, bilateral amputee Patient leans: N/A  Psychiatric Specialty Exam: Review of Systems  Constitutional: Negative.   HENT: Negative.   Eyes: Negative.   Respiratory: Negative.   Cardiovascular: Negative.   Gastrointestinal: Negative.   Genitourinary: Negative.   Musculoskeletal: Negative.   Skin: Negative.   Neurological: Negative.   Endo/Heme/Allergies: Negative.   Psychiatric/Behavioral: The patient is nervous/anxious.        Agitation    Blood pressure 139/71, pulse 79, temperature 97.8 F (36.6 C), temperature source Axillary, resp. rate 15, SpO2 96 %.There is no weight on file to calculate BMI.  General Appearance: Casual  Eye Contact::  Minimal  Speech:  Garbled and Slurred  Volume:  Normal  Mood:  Anxious, Irritable and agitation  Affect:  Blunt  Thought Process:  Disorganized  Orientation:  Other:  oriented to self  Thought  Content:  cannot assess due to mental status alteration   Suicidal Thoughts:  cannot assess due to mental status alteration  Homicidal Thoughts:  cannot assess due to mental status alteration   Memory:  cannot assess due to mental status alteration   Judgement:  Impaired  Insight:  cannot assess due to  mental status alteration   Psychomotor Activity:  Increased  Concentration:  Poor  Recall:  cannot assess due to mental status alteration   Fund of Knowledge:cannot assess due to mental status alteration   Language: Poor  Akathisia:  No  Handed:  Right  AIMS (if indicated):     Assets:  Housing Leisure Time Resilience  ADL's:  Impaired  Cognition: Impaired,  Severe  Sleep:      Treatment Plan Summary: Daily contact with patient to assess and evaluate symptoms and progress in treatment, Medication management and Plan adjustment disorder with conduct order:  -Crisis stabilization -Medication management:  Discontinue Tegretol 200 mg BID for mood stabilization and started Depakote 500 mg BID for mood stabilization.  Continue Ativan every four hours PRN anxiety, and Trazodone 50 mg at bedtime for sleep. -Individual counseling  Disposition: Recommend psychiatric Inpatient admission when medically cleared.  Waylan Boga, NP 08/20/2015 1:49 PM Patient seen face-to-face for psychiatric evaluation, chart reviewed and case discussed with the physician extender and developed treatment plan. Reviewed the information documented and agree with the treatment plan. Corena Pilgrim, MD

## 2015-08-20 NOTE — ED Notes (Signed)
Bed: [facility] Expected date:  Expected time:  Means of arrival:  Comments: 

## 2015-08-20 NOTE — ED Notes (Addendum)
Called pharmacy to verify next med's due per insulin protocol. Checked CBG 263, sliding scale states Give 5 units insulin. Spoke with pharmacist erin.  Checking with charge nurse before giving    Diet  1 apple sauce and 3 apple juices.

## 2015-08-20 NOTE — ED Notes (Signed)
CBG 148 

## 2015-08-20 NOTE — Progress Notes (Signed)
Entered in d/c instructions  Leeanne Rio Schedule an appointment as soon as possible for a visit This is your assigned Medicaid [place] access doctor If you prefer another contact [place] assigned your doctor *You may receive a bill if you go to any family Dr not assigned to you [place] [address] [phone] Medicaid [place] Access Covered Patient [place]: [place], [place] [address] [email or web address] Use this website to assist with understanding your coverage & to renew application As a Medicaid client you MUST contact DSS/SSI each time you change address, move to another [place] or another state to keep your address updated  [place] Medicaid Transportation to Dr appts if you are have full Medicaid: [phone], [phone]

## 2015-08-20 NOTE — ED Notes (Signed)
Patient transported to CT 

## 2015-08-20 NOTE — ED Notes (Signed)
Pt ADL performed and nystatin administered

## 2015-08-20 NOTE — ED Notes (Signed)
CBG 81.  

## 2015-08-20 NOTE — ED Notes (Signed)
Hospitalist paged.  Patient states has been itching all over.  Patient has made several scratch marks to the skin.

## 2015-08-20 NOTE — Progress Notes (Signed)
I have reviewed the patient's blood sugars and have discontinued the patient's lantus for tonight based on his low normal blood sugar this morning.  Continue low dose SSI.  I will reassess again tomorrow.

## 2015-08-21 DIAGNOSIS — E1169 Type 2 diabetes mellitus with other specified complication: Secondary | ICD-10-CM | POA: Diagnosis not present

## 2015-08-21 DIAGNOSIS — E785 Hyperlipidemia, unspecified: Secondary | ICD-10-CM

## 2015-08-21 DIAGNOSIS — E160 Drug-induced hypoglycemia without coma: Secondary | ICD-10-CM | POA: Diagnosis not present

## 2015-08-21 DIAGNOSIS — I10 Essential (primary) hypertension: Secondary | ICD-10-CM | POA: Diagnosis not present

## 2015-08-21 DIAGNOSIS — E876 Hypokalemia: Secondary | ICD-10-CM

## 2015-08-21 DIAGNOSIS — E1165 Type 2 diabetes mellitus with hyperglycemia: Secondary | ICD-10-CM | POA: Diagnosis not present

## 2015-08-21 DIAGNOSIS — F4324 Adjustment disorder with disturbance of conduct: Secondary | ICD-10-CM | POA: Diagnosis not present

## 2015-08-21 LAB — CBG MONITORING, ED
GLUCOSE-CAPILLARY: 87 mg/dL (ref 65–99)
GLUCOSE-CAPILLARY: 79 mg/dL (ref 65–99)
GLUCOSE-CAPILLARY: 74 mg/dL (ref 65–99)
Glucose-Capillary: 113 mg/dL — ABNORMAL HIGH (ref 65–99)
Glucose-Capillary: 219 mg/dL — ABNORMAL HIGH (ref 65–99)
Glucose-Capillary: 249 mg/dL — ABNORMAL HIGH (ref 65–99)
Glucose-Capillary: 49 mg/dL — ABNORMAL LOW (ref 65–99)

## 2015-08-21 MED ORDER — GLUCOSE 40 % PO GEL
1.0000 | Freq: Once | ORAL | Status: AC
  Administered 2015-08-21: 37.5 g via ORAL

## 2015-08-21 MED ORDER — DEXTROSE 50 % IV SOLN
INTRAVENOUS | Status: AC
  Filled 2015-08-21: qty 50

## 2015-08-21 MED ORDER — INSULIN ASPART 100 UNIT/ML ~~LOC~~ SOLN: 1.0000 [IU] | Freq: Three times a day (TID) | SUBCUTANEOUS | Status: DC

## 2015-08-21 MED ORDER — GLUCOSE 40 % PO GEL
ORAL | Status: AC
Start: 2015-08-21 — End: 2015-08-21
  Filled 2015-08-21: qty 1

## 2015-08-21 MED ORDER — INSULIN ASPART 100 UNIT/ML ~~LOC~~ SOLN
0.0000 [IU] | Freq: Three times a day (TID) | SUBCUTANEOUS | Status: DC
  Administered 2015-08-21: 2 [IU] via SUBCUTANEOUS
  Filled 2015-08-21: qty 1

## 2015-08-21 MED ORDER — METFORMIN HCL 500 MG PO TABS
500.0000 mg | ORAL_TABLET | Freq: Every day | ORAL | Status: DC
  Administered 2015-08-21 – 2015-08-23 (×3): 500 mg via ORAL
  Filled 2015-08-21 (×4): qty 1

## 2015-08-21 NOTE — ED Notes (Signed)
Spoke with Stann Mainland, NP about pt status per Henrine Screws reports that Dr. Lenna Sciara does not want pt to return to [facility] and wants him placed in a geri-psych facility. It was asked why facility is not able to do the same process of finding placement and was advised that Dr. Lenna Sciara wants pt to remain here until he is appropriately placed.

## 2015-08-21 NOTE — ED Notes (Signed)
[facility] called and stated that the pt has too many medical issues to be accepted

## 2015-08-21 NOTE — ED Notes (Addendum)
cbg 79 sliding scale 0 units given   Meal tray order -

## 2015-08-21 NOTE — ED Notes (Signed)
Patient is not and has not been restrained since 0700.

## 2015-08-21 NOTE — BH Assessment (Signed)
Patient was reassessed by TTS.   Patient was able to tell me his first name but was unable to articulate much else during the assessment. Patient was lifting his food tray and then putting it back down. Patient has been eating food as scheduled but has refused medications. Per patient chart yesterday he was trying to get out of bed without assistance although he is a bilateral amputee. Patient appears to be confused at times and has limited verbal communications. Per patients nurse, patients blood glucose was 79 this morning before he ate breakfast and will be checked after lunch. Nurse reports that he did not take medications last night and she would try again soon.   Informed Dr. Louretta Shorten and Earleen Newport, NP of information.and they continue to recommend inpatient treatment at a gerosych facility at this time.   Rosalin Hawking, LCSW Therapeutic Triage Specialist [place] 08/21/2015 11:59 AM

## 2015-08-21 NOTE — ED Notes (Addendum)
Patient refused all night time medications.

## 2015-08-21 NOTE — Clinical Social Work Note (Signed)
CSW called and left a message for pt's sister to obtain collateral information.  A message was left on her voicemail with CSW phone number to call back.  Dede Query, LCSW [place] Worker - Weekend Coverage cell #: [phone]

## 2015-08-21 NOTE — ED Notes (Signed)
SW advised that pt refused by [facility] d/t multiple medical issues

## 2015-08-21 NOTE — Progress Notes (Signed)
Disposition CSW completed additional patient referrals to the following inpatient Geropsychiatry facilities:  [facility] as they stated they did not have) [facility] will continue to follow patient for placement needs.  [place] Disposition CSW [phone]
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
## 2015-08-21 NOTE — ED Notes (Signed)
Bed: [id number] Expected date:  Expected time:  Means of arrival:  Comments: Nurse covering 21

## 2015-08-21 NOTE — ED Notes (Signed)
Patient has not displayed any aggressive behavior since 0700. Patient has been cooperative and fairly easy to redirect.

## 2015-08-22 DIAGNOSIS — E1169 Type 2 diabetes mellitus with other specified complication: Secondary | ICD-10-CM | POA: Diagnosis not present

## 2015-08-22 DIAGNOSIS — E160 Drug-induced hypoglycemia without coma: Secondary | ICD-10-CM | POA: Diagnosis not present

## 2015-08-22 DIAGNOSIS — F4324 Adjustment disorder with disturbance of conduct: Secondary | ICD-10-CM | POA: Diagnosis not present

## 2015-08-22 DIAGNOSIS — E1165 Type 2 diabetes mellitus with hyperglycemia: Secondary | ICD-10-CM | POA: Diagnosis not present

## 2015-08-22 LAB — COMPREHENSIVE METABOLIC PANEL
ALK PHOS: 64 U/L (ref 38–126)
ALT: 22 U/L (ref 17–63)
ANION GAP: 9 (ref 5–15)
AST: 28 U/L (ref 15–41)
Albumin: 2.9 g/dL — ABNORMAL LOW (ref 3.5–5.0)
BILIRUBIN TOTAL: 0.2 mg/dL — AB (ref 0.3–1.2)
BUN: 14 mg/dL (ref 6–20)
CALCIUM: 9.4 mg/dL (ref 8.9–10.3)
CO2: 29 mmol/L (ref 22–32)
Chloride: 102 mmol/L (ref 101–111)
Creatinine, Ser: 1.24 mg/dL (ref 0.61–1.24)
GFR calc non Af Amer: >60 mL/min (ref >60)
Glucose, Bld: 140 mg/dL — ABNORMAL HIGH (ref 65–99)
Potassium: 4.5 mmol/L (ref 3.5–5.1)
Sodium: 140 mmol/L (ref 135–145)
TOTAL PROTEIN: 7.3 g/dL (ref 6.5–8.1)

## 2015-08-22 LAB — CBG MONITORING, ED
GLUCOSE-CAPILLARY: 120 mg/dL — AB (ref 65–99)
GLUCOSE-CAPILLARY: 160 mg/dL — AB (ref 65–99)
GLUCOSE-CAPILLARY: 155 mg/dL — AB (ref 65–99)

## 2015-08-22 LAB — MAGNESIUM: MAGNESIUM: 1.7 mg/dL (ref 1.7–2.4)

## 2015-08-22 MED ORDER — LORAZEPAM 2 MG/ML IJ SOLN
1.0000 mg | Freq: Once | INTRAMUSCULAR | Status: AC
  Administered 2015-08-22: 1 mg via INTRAMUSCULAR
  Filled 2015-08-22: qty 1

## 2015-08-22 NOTE — ED Notes (Addendum)
Pt remains in bed and shook his head when the nurse asked him if he was okay. Pt has all side rails padded and has a sitter at the bedside. Report to [place]. (7:30pm )

## 2015-08-22 NOTE — BH Assessment (Addendum)
TC from [place] at [place]. She said she spoke w/ her Director of Nursing who reports that DON wasn't aware pt was coming back. Writer explained that pt had to be seen by psychiatry and is now psychiatrically clear and ready for discharge. She reports per DON, pt must be assessed by [facility] staff prior to being d/c from [facility]. [place] reports [facility] doesn't have transportation on the weekends, so the facility couldn't pick up pt anyway if pt had been accepted back.   Arnold Long, [place] Therapeutic Triage Specialist [phone]   Writer spoke w/ [place] at [facility] [phone]. Writer told [place] that pt has been psychiatrically cleared and pt has been calm since yesterday am. Writer also mentioned that pt's CBG is within normal range. [place] reports she will call the administrator on call and will Microbiologist back. Writer called [place] back. [place] said she will try to call her Haematologist again and then she will call the administrator on call.   Arnold Long, [place] Therapeutic Triage Specialist [phone]

## 2015-08-22 NOTE — Progress Notes (Signed)
Reviewed BMP and CBGs.  CBG well controlled.  No changes to medications or plan for today.

## 2015-08-22 NOTE — BH Assessment (Signed)
Writer reassessed pt today. He was sitting up in bed. Pt is a bilateral amputee. Pt is poor historian as he is unable to answer any of writer's questions. Pt answers each questions by saying "yeah".   Arnold Long, [place] Therapeutic Triage Specialist

## 2015-08-23 LAB — CBG MONITORING, ED
GLUCOSE-CAPILLARY: 99 mg/dL (ref 65–99)
Glucose-Capillary: 114 mg/dL — ABNORMAL HIGH (ref 65–99)

## 2015-08-23 NOTE — Progress Notes (Signed)
Spoke with Carleene Overlie NP about capacity, Updated on conversations with SW asst director, EDP (Zenia Resides), Dr Louretta Shorten, ED nursing director, request from snf, staff. SAPPU MD for 08/23/15 to be contacted by [facility] NP
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
## 2015-08-23 NOTE — Consult Note (Signed)
55 yo male who presented to the ED initially for trying to leave his SNF and agitation.  His medications were changed and he stabilized over the course of the week.  No psychiatric facilities would take him.  He stabilized yesterday am as he had been calm and cooperative since Saturday.  He was psychiatrically clear yesterday and remains stable today.  Steven [name] needs to return to his SNF.    Waylan Boga, PMH-NP 08/23/2015 Patient seen face-to-face for psychiatric evaluation, chart reviewed and case discussed with the physician extender and developed treatment plan. Reviewed the information documented and agree with the treatment plan. Corena Pilgrim, MD

## 2015-08-23 NOTE — Progress Notes (Signed)
CSW received call back from Tammy at [facility] that patient will need psych eval for capacity before patient can return. RNCM, Kim aware.    Raynaldo Opitz, [facility] Clinical Social Worker cell #: [phone]

## 2015-08-23 NOTE — Progress Notes (Signed)
CSW received message from [place] Liaison/Tammy stating that patient came back to facility and that she was not aware that he was being sent back. She states that she needs a letter or capacity and medication list for patient. CSW made NP aware and psych doctor aware of documentation liaison request.    CSW made CSW Surveyor, quantity of case.  Willette Brace [id number] ED CSW 08/23/2015 9:48 PM

## 2015-08-23 NOTE — ED Notes (Addendum)
Pt is awake and at this time is pleasant and cooperative. Pt remains with a sitter . Both siderails are padded and pt appears comfortable at this time. Pt awakened and took his am meds in applesauce. Pt remains cooperative but sleepy./ Phoned [facility] to give report for the pt to be transferred back,(10:10am)pt has been cleared medically and by pscy. Per the nursing facility the director, Missy , will phone back as they did not plan on the pt returning. Phoned [place] to make her aware. Per [place] work needs to be notified. (10:15am ) Spoke with the charge who will contact the social worker for the pt to be transferred back to his facility. Kelly at : [phone]. Pt remains cooperative and comfortable watching TV. (11:30am ) 12;30pm Tammy, clinical liason,  from the residence the pt came from arrived to see the pt and requested documentation to show he is okay to come back there. Tammy stated she will have to make sure administration. is okay with him coming back. Per Tammy, "he is very sleepy ." Informed Tammy he is on depokote which can make a pt tired .Showed Tammy the discharge note from the psch NP. Tammy requested a copy of this. Phoned charge to make her aware of the current situation. Pts CBG at 12noon is 99. 1pm Phoned [facility] to give report. Pt to be transported via [place]. Report to Lavella Lemons at [facility]. Pt will be transported within the hour. Pt is arousable when spoken to.
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
## 2015-08-23 NOTE — Progress Notes (Signed)
Spoke with Sw asst Mudlogger- note for capacity  Spoke with EDP, [employer], Left message for [employer] NP, [employer]

## 2015-08-23 NOTE — Progress Notes (Signed)
CSW received call from patient's RN, Marcie Bal that patient is ready for discharge back to [facility] SNF. CSW spoke with Tammy at [facility] who states that patient will need to be without a sitter for 24 hours before he can return. CSW encouraged RNCM, Maudie Mercury to have ED Director speak with [place] Surveyor, quantity re: policy.    Raynaldo Opitz, [facility] Clinical Social Worker cell #: [phone]

## 2015-08-23 NOTE — Progress Notes (Signed)
ED CM followed up on pt referrals sent on 08/21/15 that had not responded per notes [facility] called at [phone]  Spoke with Marita [place] who confirms pt was denied related to "age and sex" [address] called [phone] Spoke with Phyliss who states pt denied by Dr Ananias Pilgrim who recommends pt be referred to medical psych Notes are in [facility] for [place] denial of pt -medical acuity 1123 ED Cm left another voice message at [facility] [id number] to inquire about status of referral & to see if facility still at capacity (last note states at capacity until 08/23/15) [phone] called and spoke Olivia Mackie who confirms [facility] is still at capacity today Had 2 openings that were immediately filled with 2 from their ED

## 2015-08-23 NOTE — Progress Notes (Addendum)
ED CM spoke with SW asst supervisor Thedore Mins, about being medically and behaviorally cleared, sitter to be removed (ED policy was followed - Refer to Victorino Dike note)  ED CM spoke with covering ED SW, Claiborne Billings and ED nursing Mudlogger, Santiago Glad. Discussed Santiago Glad spoke with Tammy of [facility] [place] and Tammy informed Santiago Glad she would visit pt at 1 pm today   Med changes: 08/20/15 Discontinued Tegretol 200 mg BID for mood stabilization and started Depakote 500 mg BID for mood stabilization. Continued Ativan every four hours PRN anxiety, and Trazodone 50 mg at bedtime for sleep  This pt has been without restraints since 0700 08/21/15

## 2015-08-23 NOTE — Progress Notes (Signed)
ED CM spoke with Sr Jonnalagada who refers CM back to SAPPU MD/NP for 08/23/15  ED covering SW updated via text

## 2015-08-23 NOTE — ED Notes (Signed)
Attempted once again to perform pt hygiene and remove soiled diaper. Pt continues to refuse for diaper to be changed.

## 2015-08-23 NOTE — Progress Notes (Signed)
ED CM spoke with ED nursing director, Santiago Glad and covering SW, Franquez about pt  SW to speak with [employer], [employer]

## 2015-08-23 NOTE — BH Assessment (Deleted)
[place] Assessment Progress Note  At the request of Corena Pilgrim, MD, this writer called pt's group home to ask about returning pt to them.  Calls were placed and messages left at 10:44 and at 13:29.  Awaiting call back.  Jalene Mullet, [place] Triage Specialist [phone]

## 2015-08-23 NOTE — Progress Notes (Signed)
Pt discussed in SAP[facility] Progression meeting - Pt referrals sent to inpatient geripsych facilities without success. Spoke with ED nursing director, Santiago Glad and ED SW, Claiborne Billings.

## 2015-08-23 NOTE — ED Notes (Signed)
Spoke with Tammy at [facility] [phone] told her that patient was cleared and was ready to return to their facility.  Tammy stated that she would be here to evaluate this patient at 1 pm today.

## 2015-08-23 NOTE — ED Notes (Signed)
Patient remains resting quietly and is cooperative.  Sitter removed.

## 2015-08-23 NOTE — Progress Notes (Signed)
Paged Dr Lamar Benes [phone]

## 2015-08-23 NOTE — ED Notes (Addendum)
Performed pt hygiene. Patient had a large bowel movement. Pt's gown, diaper, incontinence pad, and sheets were changed. Also, applied NYSTATIN powder to patient buttocks after he was cleaned and dry. Pt is drowsy.

## 2015-08-24 ENCOUNTER — Non-Acute Institutional Stay (SKILLED_NURSING_FACILITY): Payer: Medicaid Other | Admitting: Adult Health

## 2015-08-24 ENCOUNTER — Encounter: Payer: Self-pay | Admitting: Adult Health

## 2015-08-24 ENCOUNTER — Telehealth: Payer: Self-pay | Admitting: *Deleted

## 2015-08-24 DIAGNOSIS — E1169 Type 2 diabetes mellitus with other specified complication: Secondary | ICD-10-CM

## 2015-08-24 DIAGNOSIS — I480 Paroxysmal atrial fibrillation: Secondary | ICD-10-CM | POA: Diagnosis not present

## 2015-08-24 DIAGNOSIS — E1165 Type 2 diabetes mellitus with hyperglycemia: Secondary | ICD-10-CM

## 2015-08-24 DIAGNOSIS — Z794 Long term (current) use of insulin: Secondary | ICD-10-CM

## 2015-08-24 DIAGNOSIS — G621 Alcoholic polyneuropathy: Secondary | ICD-10-CM | POA: Diagnosis not present

## 2015-08-24 DIAGNOSIS — Z89512 Acquired absence of left leg below knee: Secondary | ICD-10-CM | POA: Diagnosis not present

## 2015-08-24 DIAGNOSIS — IMO0002 Reserved for concepts with insufficient information to code with codable children: Secondary | ICD-10-CM

## 2015-08-24 DIAGNOSIS — R569 Unspecified convulsions: Secondary | ICD-10-CM

## 2015-08-24 DIAGNOSIS — Z89511 Acquired absence of right leg below knee: Secondary | ICD-10-CM | POA: Diagnosis not present

## 2015-08-24 DIAGNOSIS — I2699 Other pulmonary embolism without acute cor pulmonale: Secondary | ICD-10-CM

## 2015-08-24 NOTE — Progress Notes (Signed)
Patient ID: Steven [name], male   DOB: [birth year], 55 y.o.   MRN: [id number]   Facility:  [facility]       No Known Allergies  Chief Complaint  Patient presents with  . Hospitalization Follow-up    Hospital Follow-up    HPI:  He has been hospitalized due to behavioral issues. His glucose was low. He was held in the ED and treated there for several days and has returned to this facility. He is here for short term therapy. More than likely this does represent a long term placement for him. He is unable to fully participate in the hpi or ros.    Past Medical History  Diagnosis Date  . Uncontrolled diabetes mellitus ([place])   . HTN (hypertension)   . History of DVT (deep vein thrombosis)   . Personal history of diabetic foot ulcer   . Peripheral vascular disease ([place])     a. Abnl ABIs 2014.  Marland Kitchen Peripheral neuropathy ([place]) 11/19/2011  . Varicose vein     legs  . Osteomyelitis of foot, right, acute ([place]) 08/30/2012  . Personal history of colonic adenomas 05/29/2013  . History of cocaine use   . PAF (paroxysmal atrial fibrillation) ([place])     a. Dx 11/2013 in setting of sepsis/foot infection.  . Tobacco abuse   . H/O ETOH abuse   . Dysrhythmia     afib last admission  . Seasonal allergies   . Adjustment disorder with depressed mood   . Depression 08/18/2015    Past Surgical History  Procedure Laterality Date  . Amputation  04/21/2011    Procedure: AMPUTATION RAY;  Surgeon: Newt Minion, MD;  Location: [place];  Service: Orthopedics;  Laterality: Right;  Rt foot 2nd ray ampt  . I&d extremity  05/03/2011    Procedure: IRRIGATION AND DEBRIDEMENT EXTREMITY;  Surgeon: Newt Minion, MD;  Location: [place];  Service: Orthopedics;  Laterality: Right;  Irrigation and Debridement Right Foot and Place Acell Xenograft  . Toe amputation  04/24/2012    great toe   right foot  . Amputation  04/24/2012    Procedure: AMPUTATION RAY;  Surgeon: Newt Minion, MD;  Location: [place];  Service:  Orthopedics;  Laterality: Right;  Right foot 1st ray amputation  . Amputation Left 04/23/2013    Procedure: AMPUTATION RAY;  Surgeon: Newt Minion, MD;  Location: [place];  Service: Orthopedics;  Laterality: Left;  Left Foot 5th Ray Amputation  . Colonoscopy    . Amputation Left 11/21/2013    Procedure: AMPUTATION RAY;  Surgeon: Newt Minion, MD;  Location: [place];  Service: Orthopedics;  Laterality: Left;  Left Foot 1st & 2nd Ray Amputation  . Amputation Right 12/04/2013    Procedure: AMPUTATION BELOW KNEE;  Surgeon: Marianna Payment, MD;  Location: [place];  Service: Orthopedics;  Laterality: Right;  . Amputation Left 12/29/2013    Procedure: LEFT AMPUTATION BELOW KNEE;  Surgeon: Marianna Payment, MD;  Location: [place];  Service: Orthopedics;  Laterality: Left;  . Amputation Right 04/15/2015    Procedure: AMPUTATION ABOVE KNEE, RIGHT;  Surgeon: Newt Minion, MD;  Location: [place];  Service: Orthopedics;  Laterality: Right;    VITAL SIGNS BP 144/82 mmHg  Pulse 87  Temp(Src) 98.4 F (36.9 C) (Oral)  Resp 16  Ht 5\' 4"  (1.626 m)  Wt 181 lb (82.101 kg)  BMI 31.05 kg/m2  SpO2 98%  Patient's Medications  New Prescriptions   No  medications on file  Previous Medications   ACETAMINOPHEN (TYLENOL) 325 MG TABLET    Take 2 tablets (650 mg total) by mouth every 6 (six) hours as needed.   ASPIRIN EC 81 MG TABLET    Take 2 tablets (162 mg total) by mouth daily.   ATORVASTATIN (LIPITOR) 40 MG TABLET    Take 1 tablet (40 mg total) by mouth daily.   DILTIAZEM (CARDIZEM CD) 180 MG 24 HR CAPSULE    Take 1 capsule (180 mg total) by mouth daily.   DIVALPROEX (DEPAKOTE) 500 MG DR TABLET    Take 500 mg by mouth every 12 (twelve) hours.   ENOXAPARIN (LOVENOX) 80 MG/0.8ML INJECTION    Inject 0.8 mLs (80 mg total) into the skin every 12 (twelve) hours.   HYDROCHLOROTHIAZIDE (MICROZIDE) 12.5 MG CAPSULE    Take 1 capsule (12.5 mg total) by mouth 2 (two) times daily.   LISINOPRIL (PRINIVIL,ZESTRIL) 20 MG TABLET     take 1 tablet by mouth once daily for blood pressure   LORATADINE (CLARITIN) 10 MG TABLET    Take 10 mg by mouth daily.   LORAZEPAM (ATIVAN) 1 MG TABLET    Take 1 mg by mouth every 4 (four) hours as needed for anxiety.   METFORMIN (GLUCOPHAGE) 500 MG TABLET    Take 500 mg by mouth daily with breakfast.   MULTIPLE VITAMIN (MULTIVITAMIN WITH MINERALS) TABS TABLET    Take 1 tablet by mouth daily.   ONDANSETRON (ZOFRAN) 4 MG TABLET    Take 4 mg by mouth every 4 (four) hours as needed for nausea or vomiting.   PROBIOTIC PRODUCT (PROBIOTIC ACIDOPHILUS) CAPS    Take 1 tablet by mouth daily.   TRAZODONE (DESYREL) 50 MG TABLET    Take 50 mg by mouth at bedtime.  Modified Medications   No medications on file  Discontinued Medications     SIGNIFICANT DIAGNOSTIC EXAMS  08-03-15: ct of head: No acute intracranial pathology.   08-03-15: MRI brain: 1. Motion degraded study. 2. No acute intracranial process identified. 3. 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 4. Mild chronic small vessel ischemic disease.  08-04-15: ct angio of chest: Small pulmonary emboli in branches of the left lower lobe pulmonary artery. These small pulmonary emboli are nonobstructing. No more central pulmonary embolus. No demonstrable right heart strain.    Prominence of the ascending thoracic aorta with a measured diameter 4.2 x 4.1 cm. Recommend annual imaging followup by CTA or MRA.      8 mm nodular opacity left upper lobe. Followup of this nodular opacity should be based on Fleischner Society guidelines. If the patient is at high risk for bronchogenic carcinoma, follow-up chest CT at 3-42months is recommended. If the patient is at low risk for bronchogenic carcinoma, follow-up chest CT at 6-12 months is recommended.  08-05-15: right femur x-ray: 1. Possible osteomyelitis involving the amputated end of the distal right femur shaft. 2. Distal, lateral soft tissue air. This could reflect necrotizing  fascitis. It could potentially be in a deep abscess.  08-08-15: chest x-ray: Mild cardiomegaly, vascular congestion and early interstitial edema.  08-18-15: chest x-ray: Mild vascular congestion but improved from the prior exam.    LABS REVIEWED:   08-03-15: hgb a1c 9.9; tsh 0.625 08-16-15: wbc 6.5; hgb 14.4; hct 43.0; mcv 85.3; plt 246; glucose 105; bun 7; creat 1.08; k+ 2.8; na++147; liver normal  albumin 2.8 08-19-15: glucose 38; bun 7; creat 0.98; k+ 3.0; na++140 08-22-15: wbc  140; bun 14; creat 1.24; k+ 4.5; na++140; liver normal albumin 2.9; mag 1.7      Review of Systems  Unable to perform ROS: other     Physical Exam  Constitutional: No distress.  Eyes: Conjunctivae are normal.  Neck: Neck supple. No JVD present. No thyromegaly present.  Cardiovascular: Normal rate, regular rhythm and intact distal pulses.   Respiratory: Effort normal and breath sounds normal. No respiratory distress. He has no wheezes.  GI: Soft. Bowel sounds are normal. He exhibits no distension. There is no tenderness.  Musculoskeletal: He exhibits no edema.  Able to move all extremities  Is status post right aka Is status post left bka   Lymphadenopathy:    He has no cervical adenopathy.  Neurological: He is alert.  Skin: Skin is warm and dry. He is not diaphoretic.       ASSESSMENT/ PLAN:  1. Diabetes: at this time; will not restart his insulin; will continue metformin 500 mg daily hgb a1c is 9.9  2. Dyslipidemia: will continue lipitor 40 mg daily   3. Hypertension: will continue cardizem cd 180 mg daily; hctz 12.5 mg twice daily; lisinopril 20 mg daily   4. Multiple pulmonary emboli: is on lovenox therapy through August 2017 is presently stable will monitor  5. Afib: heart rate controlled; will continue cardizem cd 180 mg daily for rate control is on asa 81 mg daily   6. Seizures: no reports of seizure activity present; will continue depakote every 12 hours   7. Allergic rhinitis: will  continue claritin 10 mg daily   8. Depression: will continue trazodone 50 mg nightly   9. Anemia: will restart neferix 150 mg daily   10. Peripheral neuropathy: will restart neurontin 400 mg nightly for pain management     Time spent with patient  50   minutes >50% time spent counseling; reviewing medical record; tests; labs; and developing future plan of care      Ok Edwards NP [facility]  Contact [phone] Monday through Friday 8am- 5pm  After hours call [phone]

## 2015-08-26 ENCOUNTER — Encounter: Payer: Self-pay | Admitting: Internal Medicine

## 2015-08-26 ENCOUNTER — Non-Acute Institutional Stay (SKILLED_NURSING_FACILITY): Payer: Medicaid Other | Admitting: Internal Medicine

## 2015-08-26 DIAGNOSIS — F1910 Other psychoactive substance abuse, uncomplicated: Secondary | ICD-10-CM | POA: Diagnosis not present

## 2015-08-26 DIAGNOSIS — T8789 Other complications of amputation stump: Secondary | ICD-10-CM

## 2015-08-26 DIAGNOSIS — I712 Thoracic aortic aneurysm, without rupture, unspecified: Secondary | ICD-10-CM

## 2015-08-26 DIAGNOSIS — E162 Hypoglycemia, unspecified: Secondary | ICD-10-CM | POA: Diagnosis not present

## 2015-08-26 DIAGNOSIS — E785 Hyperlipidemia, unspecified: Secondary | ICD-10-CM | POA: Diagnosis not present

## 2015-08-26 DIAGNOSIS — I10 Essential (primary) hypertension: Secondary | ICD-10-CM

## 2015-08-26 DIAGNOSIS — R569 Unspecified convulsions: Secondary | ICD-10-CM

## 2015-08-26 DIAGNOSIS — R1310 Dysphagia, unspecified: Secondary | ICD-10-CM

## 2015-08-26 DIAGNOSIS — R911 Solitary pulmonary nodule: Secondary | ICD-10-CM

## 2015-08-26 DIAGNOSIS — L97909 Non-pressure chronic ulcer of unspecified part of unspecified lower leg with unspecified severity: Secondary | ICD-10-CM

## 2015-08-26 DIAGNOSIS — Z794 Long term (current) use of insulin: Secondary | ICD-10-CM

## 2015-08-26 DIAGNOSIS — E1165 Type 2 diabetes mellitus with hyperglycemia: Secondary | ICD-10-CM

## 2015-08-26 DIAGNOSIS — F4324 Adjustment disorder with disturbance of conduct: Secondary | ICD-10-CM

## 2015-08-26 DIAGNOSIS — E1169 Type 2 diabetes mellitus with other specified complication: Secondary | ICD-10-CM | POA: Diagnosis not present

## 2015-08-26 DIAGNOSIS — I2699 Other pulmonary embolism without acute cor pulmonale: Secondary | ICD-10-CM | POA: Diagnosis not present

## 2015-08-26 DIAGNOSIS — I739 Peripheral vascular disease, unspecified: Secondary | ICD-10-CM | POA: Diagnosis not present

## 2015-08-26 DIAGNOSIS — IMO0002 Reserved for concepts with insufficient information to code with codable children: Secondary | ICD-10-CM

## 2015-08-26 DIAGNOSIS — E876 Hypokalemia: Secondary | ICD-10-CM

## 2015-08-30 ENCOUNTER — Other Ambulatory Visit: Payer: Self-pay | Admitting: *Deleted

## 2015-08-30 LAB — BASIC METABOLIC PANEL: GLUCOSE: 259 mg/dL

## 2015-08-30 MED ORDER — LORAZEPAM 1 MG PO TABS: 1.0000 mg | ORAL_TABLET | ORAL | Status: AC | PRN

## 2015-08-30 NOTE — Telephone Encounter (Signed)
[facility]

## 2015-09-01 ENCOUNTER — Encounter: Payer: Self-pay | Admitting: Adult Health

## 2015-09-01 ENCOUNTER — Non-Acute Institutional Stay (SKILLED_NURSING_FACILITY): Payer: Medicaid Other | Admitting: Adult Health

## 2015-09-01 DIAGNOSIS — E1151 Type 2 diabetes mellitus with diabetic peripheral angiopathy without gangrene: Secondary | ICD-10-CM | POA: Diagnosis not present

## 2015-09-01 DIAGNOSIS — E1165 Type 2 diabetes mellitus with hyperglycemia: Secondary | ICD-10-CM

## 2015-09-01 DIAGNOSIS — IMO0002 Reserved for concepts with insufficient information to code with codable children: Secondary | ICD-10-CM

## 2015-09-01 NOTE — Progress Notes (Signed)
Patient ID: Steven [name], male   DOB: Sep 29, 1960, 55 y.o.   MRN: [id number]   Facility:  [facility]       No Known Allergies  Chief Complaint  Patient presents with  . Acute Visit    Diabetes    HPI:  Staff reports that all of his cbg's are >200. He is not voicing any complaints or symptoms of elevated cbg's. The staff is concerned and is wanting to know his medications need to be adjusted.   Past Medical History  Diagnosis Date  . Uncontrolled diabetes mellitus ([place])   . HTN (hypertension)   . History of DVT (deep vein thrombosis)   . Personal history of diabetic foot ulcer   . Peripheral vascular disease ([place])     a. Abnl ABIs 2014.  Marland Kitchen Peripheral neuropathy ([place]) 11/19/2011  . Varicose vein     legs  . Osteomyelitis of foot, right, acute ([place]) 08/30/2012  . Personal history of colonic adenomas 05/29/2013  . History of cocaine use   . PAF (paroxysmal atrial fibrillation) ([place])     a. Dx 11/2013 in setting of sepsis/foot infection.  . Tobacco abuse   . H/O ETOH abuse   . Dysrhythmia     afib last admission  . Seasonal allergies   . Adjustment disorder with depressed mood   . Depression 08/18/2015    Past Surgical History  Procedure Laterality Date  . Amputation  04/21/2011    Procedure: AMPUTATION RAY;  Surgeon: Newt Minion, MD;  Location: [place];  Service: Orthopedics;  Laterality: Right;  Rt foot 2nd ray ampt  . I&d extremity  05/03/2011    Procedure: IRRIGATION AND DEBRIDEMENT EXTREMITY;  Surgeon: Newt Minion, MD;  Location: [place];  Service: Orthopedics;  Laterality: Right;  Irrigation and Debridement Right Foot and Place Acell Xenograft  . Toe amputation  04/24/2012    great toe   right foot  . Amputation  04/24/2012    Procedure: AMPUTATION RAY;  Surgeon: Newt Minion, MD;  Location: [place];  Service: Orthopedics;  Laterality: Right;  Right foot 1st ray amputation  . Amputation Left 04/23/2013    Procedure: AMPUTATION RAY;  Surgeon: Newt Minion, MD;   Location: [place];  Service: Orthopedics;  Laterality: Left;  Left Foot 5th Ray Amputation  . Colonoscopy    . Amputation Left 11/21/2013    Procedure: AMPUTATION RAY;  Surgeon: Newt Minion, MD;  Location: [place];  Service: Orthopedics;  Laterality: Left;  Left Foot 1st & 2nd Ray Amputation  . Amputation Right 12/04/2013    Procedure: AMPUTATION BELOW KNEE;  Surgeon: Marianna Payment, MD;  Location: [place];  Service: Orthopedics;  Laterality: Right;  . Amputation Left 12/29/2013    Procedure: LEFT AMPUTATION BELOW KNEE;  Surgeon: Marianna Payment, MD;  Location: [place];  Service: Orthopedics;  Laterality: Left;  . Amputation Right 04/15/2015    Procedure: AMPUTATION ABOVE KNEE, RIGHT;  Surgeon: Newt Minion, MD;  Location: [place];  Service: Orthopedics;  Laterality: Right;    VITAL SIGNS BP 144/82 mmHg  Pulse 87  Temp(Src) 98.4 F (36.9 C) (Oral)  Resp 16  Ht 5\' 4"  (1.626 m)  Wt 181 lb (82.101 kg)  BMI 31.05 kg/m2  SpO2 98%  Patient's Medications  New Prescriptions   No medications on file  Previous Medications   ACETAMINOPHEN (TYLENOL) 325 MG TABLET    Take 2 tablets (650 mg total) by mouth every 6 (six)  hours as needed.   ASPIRIN EC 81 MG TABLET    Take 2 tablets (162 mg total) by mouth daily.   ATORVASTATIN (LIPITOR) 40 MG TABLET    Take 1 tablet (40 mg total) by mouth daily.   CHOLECALCIFEROL 10000 UNITS TABS    Take 1,000 Units by mouth daily.   DILTIAZEM (CARDIZEM CD) 180 MG 24 HR CAPSULE    Take 1 capsule (180 mg total) by mouth daily.   DIVALPROEX (DEPAKOTE) 500 MG DR TABLET    Take 500 mg by mouth every 12 (twelve) hours.   ENOXAPARIN (LOVENOX) 80 MG/0.8ML INJECTION    Inject 0.8 mLs (80 mg total) into the skin every 12 (twelve) hours.   GABAPENTIN (NEURONTIN) 400 MG CAPSULE    Take 400 mg by mouth at bedtime.   HYDROCHLOROTHIAZIDE (MICROZIDE) 12.5 MG CAPSULE    Take 1 capsule (12.5 mg total) by mouth 2 (two) times daily.   LISINOPRIL (PRINIVIL,ZESTRIL) 20 MG TABLET    take  1 tablet by mouth once daily for blood pressure   LORATADINE (CLARITIN) 10 MG TABLET    Take 10 mg by mouth daily.   LORAZEPAM (ATIVAN) 1 MG TABLET    Take 1 tablet (1 mg total) by mouth every 4 (four) hours as needed for anxiety.   METFORMIN (GLUCOPHAGE) 500 MG TABLET    Take 500 mg by mouth daily with breakfast.   METHOCARBAMOL (ROBAXIN) 500 MG TABLET    Take 500 mg by mouth every 6 (six) hours as needed for muscle spasms.   MULTIPLE VITAMIN (MULTIVITAMIN WITH MINERALS) TABS TABLET    Take 1 tablet by mouth daily.   ONDANSETRON (ZOFRAN) 4 MG TABLET    Take 4 mg by mouth every 4 (four) hours as needed for nausea or vomiting.   PROBIOTIC PRODUCT (PROBIOTIC ACIDOPHILUS) CAPS    Take 1 tablet by mouth daily.   TRAZODONE (DESYREL) 50 MG TABLET    Take 50 mg by mouth at bedtime.   UNABLE TO FIND    Med Name: HSG Mech soft texture, regular consistency  Modified Medications   No medications on file  Discontinued Medications   No medications on file     SIGNIFICANT DIAGNOSTIC EXAMS  08-03-15: ct of head: No acute intracranial pathology.   08-03-15: MRI brain: 1. Motion degraded study. 2. No acute intracranial process identified. 3. 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 4. Mild chronic small vessel ischemic disease.  08-04-15: ct angio of chest: Small pulmonary emboli in branches of the left lower lobe pulmonary artery. These small pulmonary emboli are nonobstructing. No more central pulmonary embolus. No demonstrable right heart strain.    Prominence of the ascending thoracic aorta with a measured diameter 4.2 x 4.1 cm. Recommend annual imaging followup by CTA or MRA.      8 mm nodular opacity left upper lobe. Followup of this nodular opacity should be based on Fleischner Society guidelines. If the patient is at high risk for bronchogenic carcinoma, follow-up chest CT at 3-42months is recommended. If the patient is at low risk for bronchogenic carcinoma, follow-up chest CT  at 6-12 months is recommended.  08-05-15: right femur x-ray: 1. Possible osteomyelitis involving the amputated end of the distal right femur shaft. 2. Distal, lateral soft tissue air. This could reflect necrotizing fascitis. It could potentially be in a deep abscess.  08-08-15: chest x-ray: Mild cardiomegaly, vascular congestion and early interstitial edema.  08-18-15: chest x-ray: Mild vascular congestion but improved  from the prior exam.    LABS REVIEWED:   08-03-15: hgb a1c 9.9; tsh 0.625 08-16-15: wbc 6.5; hgb 14.4; hct 43.0; mcv 85.3; plt 246; glucose 105; bun 7; creat 1.08; k+ 2.8; na++147; liver normal  albumin 2.8 08-19-15: glucose 38; bun 7; creat 0.98; k+ 3.0; na++140 08-22-15: glucose  140; bun 14; creat 1.24; k+ 4.5; na++140; liver normal albumin 2.9; mag 1.7      Review of Systems  Unable to perform ROS: other     Physical Exam  Constitutional: No distress.  Eyes: Conjunctivae are normal.  Neck: Neck supple. No JVD present. No thyromegaly present.  Cardiovascular: Normal rate, regular rhythm and intact distal pulses.   Respiratory: Effort normal and breath sounds normal. No respiratory distress. He has no wheezes.  GI: Soft. Bowel sounds are normal. He exhibits no distension. There is no tenderness.  Musculoskeletal: He exhibits no edema.  Able to move all extremities  Is status post right aka Is status post left bka   Lymphadenopathy:    He has no cervical adenopathy.  Neurological: He is alert.  Skin: Skin is warm and dry. He is not diaphoretic.       ASSESSMENT/ PLAN:  1. Diabetes: hgb a1c is 9.9 will increase lantus to 35 units daily; will change novolog to 5 units with meals with an additional 5 units for cbg >=150  Will continue metformin 500 mg daily will monitor       Ok Edwards NP [facility]  Contact [phone] Monday through Friday 8am- 5pm  After hours call [phone]

## 2015-09-12 ENCOUNTER — Encounter: Payer: Self-pay | Admitting: Internal Medicine

## 2015-09-12 DIAGNOSIS — I712 Thoracic aortic aneurysm, without rupture, unspecified: Secondary | ICD-10-CM | POA: Insufficient documentation

## 2015-09-12 DIAGNOSIS — E876 Hypokalemia: Secondary | ICD-10-CM | POA: Insufficient documentation
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
## 2015-09-20 ENCOUNTER — Telehealth: Payer: Self-pay | Admitting: Family Medicine

## 2015-09-20 NOTE — Telephone Encounter (Signed)
Patient sister calls, patient was placed in SNF, [facility]. Patient began to "act out" and the SW at the facility let him leave, Sister does not know what to do know. Pt not taking meds, and refuses to return to SNF. She would like to speak to Dr. [name] about this matter. Pls call Pam at [phone].

## 2015-09-21 ENCOUNTER — Inpatient Hospital Stay (HOSPITAL_COMMUNITY)
Admission: EM | Admit: 2015-09-21 | Discharge: 2015-09-28 | DRG: 638 | Disposition: A | Discharge status: Skilled Nursing Facility | Payer: Medicaid Other | Attending: Family Medicine | Admitting: Family Medicine

## 2015-09-21 ENCOUNTER — Emergency Department (HOSPITAL_COMMUNITY): Payer: Medicaid Other

## 2015-09-21 ENCOUNTER — Inpatient Hospital Stay (HOSPITAL_COMMUNITY): Payer: Medicaid Other

## 2015-09-21 DIAGNOSIS — I959 Hypotension, unspecified: Secondary | ICD-10-CM | POA: Diagnosis present

## 2015-09-21 DIAGNOSIS — E872 Acidosis, unspecified: Secondary | ICD-10-CM

## 2015-09-21 DIAGNOSIS — E11649 Type 2 diabetes mellitus with hypoglycemia without coma: Principal | ICD-10-CM | POA: Diagnosis present

## 2015-09-21 DIAGNOSIS — Z86711 Personal history of pulmonary embolism: Secondary | ICD-10-CM

## 2015-09-21 DIAGNOSIS — E162 Hypoglycemia, unspecified: Secondary | ICD-10-CM | POA: Diagnosis not present

## 2015-09-21 DIAGNOSIS — A419 Sepsis, unspecified organism: Secondary | ICD-10-CM | POA: Diagnosis present

## 2015-09-21 DIAGNOSIS — Z89511 Acquired absence of right leg below knee: Secondary | ICD-10-CM

## 2015-09-21 DIAGNOSIS — F1910 Other psychoactive substance abuse, uncomplicated: Secondary | ICD-10-CM | POA: Diagnosis present

## 2015-09-21 DIAGNOSIS — R680 Hypothermia, not associated with low environmental temperature: Secondary | ICD-10-CM | POA: Diagnosis present

## 2015-09-21 DIAGNOSIS — R6520 Severe sepsis without septic shock: Secondary | ICD-10-CM

## 2015-09-21 DIAGNOSIS — I480 Paroxysmal atrial fibrillation: Secondary | ICD-10-CM | POA: Diagnosis not present

## 2015-09-21 DIAGNOSIS — E785 Hyperlipidemia, unspecified: Secondary | ICD-10-CM | POA: Diagnosis present

## 2015-09-21 DIAGNOSIS — R451 Restlessness and agitation: Secondary | ICD-10-CM | POA: Diagnosis not present

## 2015-09-21 DIAGNOSIS — F1996 Other psychoactive substance use, unspecified with psychoactive substance-induced persisting amnestic disorder: Secondary | ICD-10-CM | POA: Diagnosis not present

## 2015-09-21 DIAGNOSIS — R4182 Altered mental status, unspecified: Secondary | ICD-10-CM | POA: Diagnosis present

## 2015-09-21 DIAGNOSIS — E876 Hypokalemia: Secondary | ICD-10-CM | POA: Diagnosis present

## 2015-09-21 DIAGNOSIS — Z89512 Acquired absence of left leg below knee: Secondary | ICD-10-CM | POA: Diagnosis not present

## 2015-09-21 DIAGNOSIS — I10 Essential (primary) hypertension: Secondary | ICD-10-CM | POA: Diagnosis present

## 2015-09-21 DIAGNOSIS — I2699 Other pulmonary embolism without acute cor pulmonale: Secondary | ICD-10-CM | POA: Diagnosis present

## 2015-09-21 DIAGNOSIS — I4891 Unspecified atrial fibrillation: Secondary | ICD-10-CM | POA: Diagnosis present

## 2015-09-21 DIAGNOSIS — R569 Unspecified convulsions: Secondary | ICD-10-CM

## 2015-09-21 DIAGNOSIS — E119 Type 2 diabetes mellitus without complications: Secondary | ICD-10-CM

## 2015-09-21 LAB — URINALYSIS, ROUTINE W REFLEX MICROSCOPIC
Bilirubin Urine: NEGATIVE
Glucose, UA: 100 mg/dL — AB
Hgb urine dipstick: NEGATIVE
KETONES UR: NEGATIVE mg/dL
LEUKOCYTES UA: NEGATIVE
NITRITE: NEGATIVE
Specific Gravity, Urine: 1.013 (ref 1.005–1.030)
pH: 7 (ref 5.0–8.0)

## 2015-09-21 LAB — COMPREHENSIVE METABOLIC PANEL
ALT: 16 U/L — ABNORMAL LOW (ref 17–63)
ANION GAP: 12 (ref 5–15)
AST: 19 U/L (ref 15–41)
Albumin: 3.3 g/dL — ABNORMAL LOW (ref 3.5–5.0)
Alkaline Phosphatase: 52 U/L (ref 38–126)
BILIRUBIN TOTAL: 0.6 mg/dL (ref 0.3–1.2)
BUN: 16 mg/dL (ref 6–20)
CO2: 24 mmol/L (ref 22–32)
Calcium: 9.3 mg/dL (ref 8.9–10.3)
Chloride: 104 mmol/L (ref 101–111)
Creatinine, Ser: 1.08 mg/dL (ref 0.61–1.24)
GFR calc Af Amer: >60 mL/min (ref >60)
Glucose, Bld: 97 mg/dL (ref 65–99)
POTASSIUM: 3.4 mmol/L — AB (ref 3.5–5.1)
Sodium: 140 mmol/L (ref 135–145)
TOTAL PROTEIN: 6.9 g/dL (ref 6.5–8.1)

## 2015-09-21 LAB — I-STAT VENOUS BLOOD GAS, ED
ACID-BASE DEFICIT: 2 mmol/L (ref 0.0–2.0)
BICARBONATE: 26.1 meq/L — AB (ref 20.0–24.0)
O2 SAT: 54 %
PO2 VEN: 34 mmHg (ref 31.0–45.0)
TCO2: 28 mmol/L (ref 0–100)
pCO2, Ven: 59.1 mmHg — ABNORMAL HIGH (ref 45.0–50.0)
pH, Ven: 7.253 (ref 7.250–7.300)

## 2015-09-21 LAB — CBC WITH DIFFERENTIAL/PLATELET
BASOS ABS: 0 10*3/uL (ref 0.0–0.1)
Basophils Relative: 0 %
EOS PCT: 1 %
Eosinophils Absolute: 0 10*3/uL (ref 0.0–0.7)
HEMATOCRIT: 42.3 % (ref 39.0–52.0)
Hemoglobin: 13.7 g/dL (ref 13.0–17.0)
LYMPHS PCT: 14 %
Lymphs Abs: 1.1 10*3/uL (ref 0.7–4.0)
MCH: 26.7 pg (ref 26.0–34.0)
MCHC: 32.4 g/dL (ref 30.0–36.0)
MCV: 82.3 fL (ref 78.0–100.0)
MONO ABS: 0.7 10*3/uL (ref 0.1–1.0)
Monocytes Relative: 8 %
Neutro Abs: 6.2 10*3/uL (ref 1.7–7.7)
Neutrophils Relative %: 77 %
PLATELETS: 179 10*3/uL (ref 150–400)
RBC: 5.14 MIL/uL (ref 4.22–5.81)
RDW: 15 % (ref 11.5–15.5)
WBC: 8.1 10*3/uL (ref 4.0–10.5)

## 2015-09-21 LAB — VALPROIC ACID LEVEL

## 2015-09-21 LAB — CBG MONITORING, ED
GLUCOSE-CAPILLARY: 22 mg/dL — AB (ref 65–99)
GLUCOSE-CAPILLARY: 72 mg/dL (ref 65–99)
GLUCOSE-CAPILLARY: 58 mg/dL — AB (ref 65–99)
GLUCOSE-CAPILLARY: 66 mg/dL (ref 65–99)
Glucose-Capillary: <10 mg/dL — CL (ref 65–99)
Glucose-Capillary: 189 mg/dL — ABNORMAL HIGH (ref 65–99)
Glucose-Capillary: 93 mg/dL (ref 65–99)
Glucose-Capillary: 105 mg/dL — ABNORMAL HIGH (ref 65–99)

## 2015-09-21 LAB — LACTIC ACID, PLASMA
LACTIC ACID, VENOUS: 4.6 mmol/L — AB (ref 0.5–2.0)
LACTIC ACID, VENOUS: 2.8 mmol/L — AB (ref 0.5–2.0)
LACTIC ACID, VENOUS: 2.3 mmol/L — AB (ref 0.5–2.0)

## 2015-09-21 LAB — AMMONIA: Ammonia: 33 umol/L (ref 9–35)

## 2015-09-21 LAB — RAPID URINE DRUG SCREEN, HOSP PERFORMED
AMPHETAMINES: NOT DETECTED
BENZODIAZEPINES: POSITIVE — AB
Barbiturates: NOT DETECTED
COCAINE: POSITIVE — AB
OPIATES: NOT DETECTED
Tetrahydrocannabinol: NOT DETECTED

## 2015-09-21 LAB — PROTIME-INR
INR: 1.04 (ref 0.00–1.49)
Prothrombin Time: 13.8 seconds (ref 11.6–15.2)

## 2015-09-21 LAB — URINE MICROSCOPIC-ADD ON
Bacteria, UA: NONE SEEN
SQUAMOUS EPITHELIAL / LPF: NONE SEEN

## 2015-09-21 LAB — TSH: TSH: 1.091 u[IU]/mL (ref 0.350–4.500)

## 2015-09-21 LAB — ETHANOL

## 2015-09-21 LAB — TROPONIN I: Troponin I: <0.03 ng/mL (ref <0.031)

## 2015-09-21 LAB — ACETAMINOPHEN LEVEL

## 2015-09-21 LAB — SALICYLATE LEVEL: Salicylate Lvl: <4 mg/dL (ref 2.8–30.0)

## 2015-09-21 LAB — CK: Total CK: 176 U/L (ref 49–397)

## 2015-09-21 LAB — GLUCOSE, CAPILLARY: GLUCOSE-CAPILLARY: 142 mg/dL — AB (ref 65–99)

## 2015-09-21 MED ORDER — VANCOMYCIN HCL 10 G IV SOLR
1500.0000 mg | Freq: Once | INTRAVENOUS | Status: AC
  Administered 2015-09-21: 1500 mg via INTRAVENOUS
  Filled 2015-09-21: qty 1500

## 2015-09-21 MED ORDER — DEXTROSE 50 % IV SOLN
INTRAVENOUS | Status: AC
  Filled 2015-09-21: qty 100

## 2015-09-21 MED ORDER — LORAZEPAM 1 MG PO TABS: 1.0000 mg | ORAL_TABLET | Freq: Four times a day (QID) | ORAL | Status: AC | PRN

## 2015-09-21 MED ORDER — SODIUM CHLORIDE 0.9 % IV BOLUS (SEPSIS): 500.0000 mL | Freq: Once | INTRAVENOUS | Status: DC

## 2015-09-21 MED ORDER — DEXTROSE-NACL 5-0.45 % IV SOLN
INTRAVENOUS | Status: DC
  Administered 2015-09-21 – 2015-09-22 (×5): via INTRAVENOUS

## 2015-09-21 MED ORDER — SODIUM CHLORIDE 0.9 % IV BOLUS (SEPSIS)
1000.0000 mL | Freq: Once | INTRAVENOUS | Status: AC
  Administered 2015-09-21: 1000 mL via INTRAVENOUS

## 2015-09-21 MED ORDER — PIPERACILLIN-TAZOBACTAM 3.375 G IVPB 30 MIN
3.3750 g | Freq: Once | INTRAVENOUS | Status: AC
  Administered 2015-09-21: 3.375 g via INTRAVENOUS
  Filled 2015-09-21: qty 50

## 2015-09-21 MED ORDER — PIPERACILLIN-TAZOBACTAM 3.375 G IVPB
3.3750 g | Freq: Three times a day (TID) | INTRAVENOUS | Status: DC
  Administered 2015-09-21 – 2015-09-23 (×5): 3.375 g via INTRAVENOUS
  Filled 2015-09-21 (×7): qty 50

## 2015-09-21 MED ORDER — VITAMIN B-1 100 MG PO TABS
100.0000 mg | ORAL_TABLET | Freq: Every day | ORAL | Status: DC
Start: 2015-09-21 — End: 2015-09-28
  Administered 2015-09-21 – 2015-09-28 (×7): 100 mg via ORAL
  Filled 2015-09-21 (×8): qty 1

## 2015-09-21 MED ORDER — LORAZEPAM 2 MG/ML IJ SOLN
1.0000 mg | Freq: Four times a day (QID) | INTRAMUSCULAR | Status: AC | PRN
  Administered 2015-09-22 – 2015-09-24 (×4): 1 mg via INTRAVENOUS
  Filled 2015-09-21 (×5): qty 1

## 2015-09-21 MED ORDER — DIVALPROEX SODIUM 500 MG PO DR TAB
500.0000 mg | DELAYED_RELEASE_TABLET | Freq: Two times a day (BID) | ORAL | Status: DC
  Administered 2015-09-21 – 2015-09-28 (×13): 500 mg via ORAL
  Filled 2015-09-21 (×14): qty 1

## 2015-09-21 MED ORDER — FOLIC ACID 1 MG PO TABS
1.0000 mg | ORAL_TABLET | Freq: Every day | ORAL | Status: DC
  Administered 2015-09-21 – 2015-09-28 (×7): 1 mg via ORAL
  Filled 2015-09-21 (×8): qty 1

## 2015-09-21 MED ORDER — DILTIAZEM HCL ER 90 MG PO CP12
90.0000 mg | ORAL_CAPSULE | Freq: Two times a day (BID) | ORAL | Status: DC
  Administered 2015-09-21 – 2015-09-28 (×13): 90 mg via ORAL
  Filled 2015-09-21 (×14): qty 1

## 2015-09-21 MED ORDER — DEXTROSE 50 % IV SOLN
1.0000 | Freq: Once | INTRAVENOUS | Status: AC
  Administered 2015-09-21: 50 mL via INTRAVENOUS

## 2015-09-21 MED ORDER — ADULT MULTIVITAMIN W/MINERALS CH
1.0000 | ORAL_TABLET | Freq: Every day | ORAL | Status: DC
  Administered 2015-09-21 – 2015-09-28 (×7): 1 via ORAL
  Filled 2015-09-21 (×7): qty 1

## 2015-09-21 MED ORDER — SODIUM CHLORIDE 0.9 % IV BOLUS (SEPSIS): 1000.0000 mL | Freq: Once | INTRAVENOUS | Status: DC

## 2015-09-21 MED ORDER — DEXTROSE-NACL 5-0.45 % IV SOLN: INTRAVENOUS | Status: DC

## 2015-09-21 MED ORDER — ENOXAPARIN SODIUM 100 MG/ML ~~LOC~~ SOLN
1.0000 mg/kg | Freq: Two times a day (BID) | SUBCUTANEOUS | Status: DC
  Administered 2015-09-21 – 2015-09-28 (×12): 90 mg via SUBCUTANEOUS
  Filled 2015-09-21 (×13): qty 1

## 2015-09-21 MED ORDER — VANCOMYCIN HCL IN DEXTROSE 1-5 GM/200ML-% IV SOLN
1000.0000 mg | Freq: Two times a day (BID) | INTRAVENOUS | Status: DC
  Administered 2015-09-22 – 2015-09-23 (×3): 1000 mg via INTRAVENOUS
  Filled 2015-09-21 (×4): qty 200

## 2015-09-21 MED ORDER — SODIUM CHLORIDE 0.9% FLUSH
3.0000 mL | Freq: Two times a day (BID) | INTRAVENOUS | Status: DC
Start: 2015-09-21 — End: 2015-09-28
  Administered 2015-09-21 – 2015-09-28 (×11): 3 mL via INTRAVENOUS

## 2015-09-21 MED ORDER — ATORVASTATIN CALCIUM 40 MG PO TABS
40.0000 mg | ORAL_TABLET | Freq: Every day | ORAL | Status: DC
  Administered 2015-09-22 – 2015-09-27 (×4): 40 mg via ORAL
  Filled 2015-09-21 (×5): qty 1

## 2015-09-21 MED ORDER — ASPIRIN EC 81 MG PO TBEC
81.0000 mg | DELAYED_RELEASE_TABLET | Freq: Every day | ORAL | Status: DC
  Administered 2015-09-21 – 2015-09-28 (×7): 81 mg via ORAL
  Filled 2015-09-21 (×8): qty 1

## 2015-09-21 MED ORDER — THIAMINE HCL 100 MG/ML IJ SOLN
100.0000 mg | Freq: Every day | INTRAMUSCULAR | Status: DC
Start: 2015-09-21 — End: 2015-09-28

## 2015-09-21 MED ORDER — POTASSIUM CHLORIDE CRYS ER 20 MEQ PO TBCR
20.0000 meq | EXTENDED_RELEASE_TABLET | Freq: Once | ORAL | Status: AC
  Administered 2015-09-21: 20 meq via ORAL
  Filled 2015-09-21: qty 1

## 2015-09-21 MED ORDER — DEXTROSE 50 % IV SOLN
INTRAVENOUS | Status: AC
  Filled 2015-09-21: qty 50

## 2015-09-21 NOTE — Telephone Encounter (Signed)
Returned call to sister Jeannene Patella. She reports that on Friday patient left the SNF, stating he didn't want to live there anymore. He is now staying at his apartment ( Pam never stopped paying rent for him).  As far as Pam knows, he's not taking any medication. Pam spoke with a Education officer, museum yesterday who went to see him and reportedly he's doing okay there.  Advised to [facility] that since I've not seen patient since he was in the hospital obtunded, I can't make recommendations about his medications right now. Would need to see him in the clinic. Recommended if she has concerns about his safety or that he might be a danger to himself or others she can call the police and they can involuntarily commit him.  Pam appreciative.  Leeanne Rio, MD

## 2015-09-21 NOTE — ED Provider Notes (Signed)
CSN: [id number]     Arrival date & time 09/21/15  1500 History   First MD Initiated Contact with Patient 09/21/15 1525     Chief Complaint  Patient presents with  . Altered Mental Status     (Consider location/radiation/quality/duration/timing/severity/associated sxs/prior Treatment) Patient is a 55 y.o. male presenting with altered mental status. The history is provided by the patient.  Altered Mental Status Presenting symptoms: confusion, lethargy and partial responsiveness   Severity:  Severe Most recent episode:  Today Episode history:  Single Duration:  12 hours Timing:  Constant Progression:  Worsening Chronicity:  Recurrent Context: drug use and recent illness   Context: not a recent change in medication     No past medical history on file. No past surgical history on file. No family history on file. Social History  Substance Use Topics  . Smoking status: Not on file  . Smokeless tobacco: Not on file  . Alcohol Use: Not on file    Review of Systems  Unable to perform ROS: Mental status change  Psychiatric/Behavioral: Positive for confusion.      Allergies  Review of patient's allergies indicates not on file.  Home Medications   Prior to Admission medications   Not on File   BP 117/74 mmHg  Pulse 88  Temp(Src) 98.4 F (36.9 C) (Oral)  Resp 17  Ht 6' (1.829 m)  Wt 88.905 kg  BMI 26.58 kg/m2  SpO2 97% Physical Exam  Constitutional: He is oriented to person, place, and time.   Chronically ill-appearing male in no distress.  Drowsy but responds to pain. Bilateral lower extremity amputations.  HENT:  Head: Normocephalic and atraumatic.  Mouth/Throat: No oropharyngeal exudate.   No apparent head trauma  Eyes: Conjunctivae are normal. Pupils are equal, round, and reactive to light.  Neck: Normal range of motion. Neck supple.  Cardiovascular: Normal heart sounds.  Tachycardia present.  Exam reveals no friction rub.   No murmur heard. Pulmonary/Chest:  Effort normal and breath sounds normal. No respiratory distress. He has no wheezes.  Abdominal: Soft. He exhibits no distension. There is no tenderness.  Musculoskeletal: He exhibits no edema.  Neurological: He is alert and oriented to person, place, and time. GCS eye subscore is 2. GCS verbal subscore is 3. GCS motor subscore is 5.   Moves all extremities. No obvious cranial nerve deficit. Face is symmetric.   Skin: Skin is warm. No rash noted.  Cool to touch  Nursing note and vitals reviewed.   ED Course  Procedures (including critical care time) Labs Review Labs Reviewed  COMPREHENSIVE METABOLIC PANEL - Abnormal; Notable for the following:    Potassium 3.4 (*)    Albumin 3.3 (*)    ALT 16 (*)    All other components within normal limits  LACTIC ACID, PLASMA - Abnormal; Notable for the following:    Lactic Acid, Venous 4.6 (*)    All other components within normal limits  LACTIC ACID, PLASMA - Abnormal; Notable for the following:    Lactic Acid, Venous 2.8 (*)    All other components within normal limits  URINALYSIS, ROUTINE W REFLEX MICROSCOPIC (NOT AT [facility]) - Abnormal; Notable for the following:    Glucose, UA 100 (*)    Protein, ur >300 (*)    All other components within normal limits  URINE RAPID DRUG SCREEN, HOSP PERFORMED - Abnormal; Notable for the following:    Cocaine POSITIVE (*)    Benzodiazepines POSITIVE (*)    All other  components within normal limits  ACETAMINOPHEN LEVEL - Abnormal; Notable for the following:    Acetaminophen (Tylenol), Serum <10 (*)    All other components within normal limits  URINE MICROSCOPIC-ADD ON - Abnormal; Notable for the following:    Casts HYALINE CASTS (*)    All other components within normal limits  VALPROIC ACID LEVEL - Abnormal; Notable for the following:    Valproic Acid Lvl <10 (*)    All other components within normal limits  LACTIC ACID, PLASMA - Abnormal; Notable for the following:    Lactic Acid, Venous 2.3 (*)    All  other components within normal limits  GLUCOSE, CAPILLARY - Abnormal; Notable for the following:    Glucose-Capillary 142 (*)    All other components within normal limits  GLUCOSE, CAPILLARY - Abnormal; Notable for the following:    Glucose-Capillary 164 (*)    All other components within normal limits  CBG MONITORING, ED - Abnormal; Notable for the following:    Glucose-Capillary 22 (*)    All other components within normal limits  I-STAT VENOUS BLOOD GAS, ED - Abnormal; Notable for the following:    pCO2, Ven 59.1 (*)    Bicarbonate 26.1 (*)    All other components within normal limits  CBG MONITORING, ED - Abnormal; Notable for the following:    Glucose-Capillary 58 (*)    All other components within normal limits  CBG MONITORING, ED - Abnormal; Notable for the following:    Glucose-Capillary <10 (*)    All other components within normal limits  CBG MONITORING, ED - Abnormal; Notable for the following:    Glucose-Capillary 189 (*)    All other components within normal limits  CBG MONITORING, ED - Abnormal; Notable for the following:    Glucose-Capillary 105 (*)    All other components within normal limits  MRSA PCR SCREENING  CULTURE, BLOOD (ROUTINE X 2)  CULTURE, BLOOD (ROUTINE X 2)  ETHANOL  TROPONIN I  CBC WITH DIFFERENTIAL/PLATELET  PROTIME-INR  CK  SALICYLATE LEVEL  TSH  AMMONIA  LACTIC ACID, PLASMA  CBC  BASIC METABOLIC PANEL  CBG MONITORING, ED  CBG MONITORING, ED  CBG MONITORING, ED    Imaging Review Ct Head Wo Contrast  09/21/2015  CLINICAL DATA:  Patient found unresponsive on floor at home. Altered mental status. Initial encounter. EXAM: CT HEAD WITHOUT CONTRAST TECHNIQUE: Contiguous axial images were obtained from the base of the skull through the vertex without intravenous contrast. COMPARISON:  None. FINDINGS: There is no evidence of acute infarction, mass lesion, or intra- or extra-axial hemorrhage on CT. Mild periventricular white matter change likely  reflects small vessel ischemic microangiopathy. The posterior fossa, including the cerebellum, brainstem and fourth ventricle, is within normal limits. The third and lateral ventricles, and basal ganglia are unremarkable in appearance. The cerebral hemispheres are symmetric in appearance, with normal gray-white differentiation. No mass effect or midline shift is seen. There is no evidence of fracture; visualized osseous structures are unremarkable in appearance. The visualized portions of the orbits are within normal limits. The paranasal sinuses and mastoid air cells are well-aerated. No significant soft tissue abnormalities are seen. IMPRESSION: 1. No evidence of traumatic intracranial injury or fracture. 2. Mild small vessel ischemic microangiopathy. Electronically Signed   By: Garald Balding M.D.   On: 09/21/2015 21:21   Dg Chest Portable 1 View  09/21/2015  CLINICAL DATA:  Altered mental status EXAM: PORTABLE CHEST 1 VIEW COMPARISON:  None. FINDINGS: Cardiac shadow is  mildly enlarged. Mild central vascular congestion is noted. No focal confluent infiltrate is seen. No acute bony abnormality is noted. IMPRESSION: Mild vascular congestion. Electronically Signed   By: Inez Catalina M.D.   On: 09/21/2015 16:44   I have personally reviewed and evaluated these images and lab results as part of my medical decision-making.   EKG Interpretation   Date/Time:  Tuesday September 21 2015 15:03:22 EDT Ventricular Rate:  131 PR Interval:    QRS Duration: 77 QT Interval:  322 QTC Calculation: 475 R Axis:   45 Text Interpretation:  Atrial fibrillation Ventricular premature complex  Repol abnrm suggests ischemia, diffuse leads No old tracing to compare  Confirmed by KNAPP  MD-J, JON [id number]) on 09/21/2015 3:48:56 PM Also  confirmed by [facility]  MD-J, JON [id number]), editor Gilford Rile, CCT, [place] ([id number])   on 09/21/2015 4:09:21 PM      MDM   55 yo M with PMHx of HTN, HLD, hypoglycemia, poor medication adherence who  presents with AMS, hypoglycemia and hypothermia. On arrival, pt lethargic, confused but protecting airway with intact gag reflex. No pooling of secretions. Exam is as above. Given hypothermia, hypoglycemia, mild hypotension, will initiate broad work-up with 30 cc/kg bolus, IV ABX, and labs. Of note, pt was reportedly just hospitalized for hypoglycemia 2/2 medication misuse, possible substance abuse. D50 given on arrival here. Will start IVF, d5 drip, and monitor closely.  Labs/imaging as above, EKG non-ischemic. CXR shows mild vascular congestion, no focal PNA. Pt intolerant of CT Head but with no focal neuro deficits to suggest CVA or bleed. Labs as above, CBC with no leukocytosis or anemia. CMP with mild hypokalemia, otherwise unremarkable. Blood gas with mild resp acidosis - placed on BiPAP with improvement in mental status. Initial lactate 4.6 - IVF, IV abx ordered. Repeat glu remains intermittent hypoglycemic - d5 drip increased.  Repeat LA Improving. Pt improved on exam, protecting airway. IV ABX given. Will admit for AMS, hypoglycemia, possible sepsis. PCP in agreement.  Clinical Impression: 1. Hypoglycemia   2. Altered mental status, unspecified altered mental status type   3. Lactic acidosis     Disposition: Admit  Condition: Stable  Pt seen in conjunction with Dr. Andrey Cota, MD 09/22/15 [id number]  Dorie Rank, MD 09/23/15 [id number]

## 2015-09-21 NOTE — ED Notes (Signed)
Spoke with Dr. Ellender Hose about not being able to get second set of blood culture he instructed to begin antibiotics.

## 2015-09-21 NOTE — ED Notes (Signed)
Per EMS, pt was last heard from last night around 2230. Pt was found today at home with altered mental status in supine position. Initial CBG 12. EMS gave an amp of D50 and improved pts CBG to 42. Pt then became combative. EMS gave 5mg  of versed. Pt recently admitted for seizures and hypoglycemia.

## 2015-09-21 NOTE — ED Notes (Signed)
Patient back from CT, fluid bolus still infusing. Patient made aware of transfer to room and agreeable.

## 2015-09-21 NOTE — Progress Notes (Signed)
Patient placed on bipap per MD order.  Patient is currently tolerating well.  Will continue to monitor.  

## 2015-09-21 NOTE — ED Notes (Signed)
CBG 22

## 2015-09-21 NOTE — H&P (Signed)
[facility] Admission History and Physical Service Pager: [phone]  Patient name: Marvyn Crossan Medical record number: [id number] Date of birth: April 28, 1961 Age: 55 y.o. Gender: male  Primary Care Provider: Chrisandra Netters, MD Consultants: None  Code Status: full code - unable to discuss with patient due to altered mental status - appears to have been made DNR during previous admission when not talking or responding to commands, but appears much different than that currently - will attempt to discuss with patient or family members in the AM further  Chief Complaint: found down  Assessment and Plan: [place] Destefano is a 55 y.o. male presenting with altered mental status . 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  # Altered Mental Status: Found unconscious at home by neighbor. GCS score 10. Hypoglycemia managed as below, suspected to be contributing. Also possible that sepsis contributing (see below).  Head CT without acute intracranial abnormalities. No seizure activity noted, but patient does have history of seizure related to hypoglycemia noted in chart. For medication list review, it seems he is supposed be taking Depakote 500 mg twice a day. Unclear if he is taking this. Also possible this could be related to alcohol withdrawal, cocaine use. UDS positive for benzos and cocaine. Post ictal state also possible, though no seizure activity reported. Patient does not appear to be in status epilepticus currently. - Admit to Step Down Unit, [place] attending - Treatment for sepsis and hypoglycemia as below - Follow-up ammonia level - Follow-up EEG - Consider neurology consult in the a.m. - CIWA protocol - consider Haldol prn for agitation - Resume home Depakote and check Depakote level  #Severe Sepsis, unclear source:  On presentation, hypothermic to 94.8, tachycardic (per report), and hypotensive to 98/65.  Lactic acid 4.6 and improved to 2.8 after 2L NS bolus.  No obvious source with noninfectious urine, clear CXR, and no skin rashes or open wounds, and no meningeal signs. WBC normal. - Continue Vanc/Zosyn for broad coverage - f/u UCx and BCx (only one seems to have been collected prior to admin of abx) - f/u repeat lactic acids  # Hypoglycemia, T2DM: History of insulin dependent diabetes with multiple prior admissions for hypoglycemia. Suspected to be contributing to altered mental status. Hypoglycemia noted with initial CBG 12--given 1amp of D50 prior to arrival to ED. Another 4 amps of D50 given and initiated D5. Last A1C 9.9 on 08/03/15. No clear records of insulin dosing at home, also on Metformin. - Hold diabetic medications - Monitor CBGs q4h - Continue D5 1/2 NS  # Hypokalemia: mild, K 3.4 on admission - replete with K Dur 20 mEq 1  - repeat BMET in AM  # HTN: BPs low on presentation, now normotensive. Prescribed Lisinopril 20mg  and HCTZ 12.5 mg daily. - Holding oral antihypertensives in setting of severe sepsis as above - Monitor closely  #h/o multiple PEs in 07/2015: Patient was discharged on treatment dose Lovenox. Unclear if patient is currently taking this or other anticoagulation. No current evidence of acute VTE - Resume treatment dose Lovenox  # Atrial Fibrillation: Currently prescribed Diltiazem 180mg  daily, Lovenox, and Aspirin 81mg .  unclear if patient is taking these regularly - Avoid beta blockers in setting of cocaine use - Continue diltiazem 90 mg twice a day - monitor BP closely - Continue home aspirin - Anticoagulation with Lovenox  # History of Alcohol  Use/Illicit Drug Use: UDS positive for cocaine and benzos.  Patient unable to provide history on last drink or other substance use.  - CIWA protocol - thiamine, folate, and MVI daily  FEN/GI: Continue D5-1/2NS @ 165mL/hr,  carb mod diet Prophylaxis: treatment dose lovenox  Disposition: Admit to SDU, FPTS, attending [place]  History of Present Illness: Philemon Michalak is a 55 y.o. male presenting with AMS.  Per report from EDP and RN, patient was found down earlier today in supine position outside of apartment by a neighbor.  EMS was called and brought patient to ED.  Patient was last known well last night around 2230.  Initial CBG was 12 and EMS gave amp of D50.  With improving CBGs, patient became combative and required versed 5mg .  Patient unable to provide any further history. He does not know why he is currently in the hospital. He denies any pain, shortness of breath, or any other symptoms other than feeling cold.  Review Of Systems: Per HPI with the following additions: Unable to obtain due to mental status  Patient Active Problem List   Diagnosis Date Noted  . Hypoglycemia 09/21/2015  . Altered mental status 09/21/2015  . Severe sepsis ([place]) 09/21/2015  . HTN (hypertension) 09/21/2015  . T2DM (type 2 diabetes mellitus) ([place]) 09/21/2015  . Atrial fibrillation ([place]) 09/21/2015  . Pulmonary embolism ([place]) 09/21/2015  . Polysubstance abuse 09/21/2015  . Seizures ([place]) 09/21/2015  . Hypokalemia 09/21/2015   Past Medical History: No past medical history on file. Past Surgical History: No past surgical history on file. Social History: Social History  Substance Use Topics  . Smoking status: Not on file  . Smokeless tobacco: Not on file  . Alcohol Use: Not on file   Additional social history: patient unable to provide any further history  Please also refer to relevant sections of EMR.  Family History: No family history on file. Allergies and Medications: Not on File No current facility-administered medications on file prior to encounter.   No current outpatient prescriptions on file prior to encounter.    See past medical history, past surgical history, family history, allergies, and  medications in other chart that is linked for merge.  Objective: BP 154/81 mmHg  Pulse 90  Temp(Src) 97.5 F (36.4 C) (Oral)  Resp 19  Ht 6' (1.829 m)  Wt 196 lb (88.905 kg)  BMI 26.58 kg/m2  SpO2 98% Exam: General: sitting up in bed, NAD HEENT: NCAT, slightly dry MM, PERRL, EOMI, no evidence of head injury Neck: Supple, no LAD, no stiffness, full range of motion Cardiovascular: irreg irreg, reg rate, no m/r/g Respiratory: Normal work of breathing, CTAB Abdomen: Soft, NT ND, positive bowel sounds Extremities: Right AKA and left BKA with well-healed stumps, no edema Skin: No rashes or open wounds Neuro: Alert, oriented to self and place, moves all 4 extremities, no gross deficits Psych: Tangential and perseverative  Labs and Imaging: CBC BMET   Recent Labs Lab 09/21/15 1607  WBC 8.1  HGB 13.7  HCT 42.3  PLT 179    Recent Labs Lab 09/21/15 1607  NA 140  K 3.4*  CL 104  CO2 24  BUN 16  CREATININE 1.08  GLUCOSE 97  CALCIUM 9.3      Drugs of Abuse     Component Value Date/Time   LABOPIA NONE DETECTED 09/21/2015 1847   COCAINSCRNUR POSITIVE* 09/21/2015 1847   LABBENZ POSITIVE* 09/21/2015 1847   AMPHETMU NONE DETECTED 09/21/2015 1847   THCU NONE DETECTED  09/21/2015 1847   LABBARB NONE DETECTED 09/21/2015 1847     Urinalysis    Component Value Date/Time   COLORURINE YELLOW 09/21/2015 1847   APPEARANCEUR CLEAR 09/21/2015 1847   LABSPEC 1.013 09/21/2015 1847   PHURINE 7.0 09/21/2015 1847   GLUCOSEU 100* 09/21/2015 1847   HGBUR NEGATIVE 09/21/2015 1847   BILIRUBINUR NEGATIVE 09/21/2015 1847   KETONESUR NEGATIVE 09/21/2015 1847   PROTEINUR >300* 09/21/2015 1847   NITRITE NEGATIVE 09/21/2015 1847   LEUKOCYTESUR NEGATIVE 09/21/2015 1847      Lactic acid 4.6 > 2.8 Troponin < 0.03 CK 176 Alcohol <5 Acetaminophen Q000111Q Salicylate <4 TSH 99991111  EKG: afib, repol abnormality   Ct Head Wo Contrast  09/21/2015  CLINICAL DATA:  Patient found  unresponsive on floor at home. Altered mental status. Initial encounter. EXAM: CT HEAD WITHOUT CONTRAST TECHNIQUE: Contiguous axial images were obtained from the base of the skull through the vertex without intravenous contrast. COMPARISON:  None. FINDINGS: There is no evidence of acute infarction, mass lesion, or intra- or extra-axial hemorrhage on CT. Mild periventricular white matter change likely reflects small vessel ischemic microangiopathy. The posterior fossa, including the cerebellum, brainstem and fourth ventricle, is within normal limits. The third and lateral ventricles, and basal ganglia are unremarkable in appearance. The cerebral hemispheres are symmetric in appearance, with normal gray-white differentiation. No mass effect or midline shift is seen. There is no evidence of fracture; visualized osseous structures are unremarkable in appearance. The visualized portions of the orbits are within normal limits. The paranasal sinuses and mastoid air cells are well-aerated. No significant soft tissue abnormalities are seen. IMPRESSION: 1. No evidence of traumatic intracranial injury or fracture. 2. Mild small vessel ischemic microangiopathy. Electronically Signed   By: Garald Balding M.D.   On: 09/21/2015 21:21   Dg Chest Portable 1 View  09/21/2015  CLINICAL DATA:  Altered mental status EXAM: PORTABLE CHEST 1 VIEW COMPARISON:  None. FINDINGS: Cardiac shadow is mildly enlarged. Mild central vascular congestion is noted. No focal confluent infiltrate is seen. No acute bony abnormality is noted. IMPRESSION: Mild vascular congestion. Electronically Signed   By: Inez Catalina M.D.   On: 09/21/2015 16:44     Virginia Crews, MD 09/21/2015, 9:32 PM PGY-2, [place] Intern pager: [phone], text pages welcome

## 2015-09-21 NOTE — ED Notes (Signed)
CBG 72  

## 2015-09-21 NOTE — ED Notes (Signed)
Unable to review pts screening questions due to mental status.

## 2015-09-21 NOTE — Progress Notes (Signed)
Pharmacy Antibiotic Note  Steven [name] is a 55 y.o. male admitted on 09/21/2015 with sepsis.  Brought in by EMS with CBG of 12 when found with AMS.  Because combative.  Recently admitted for seizures and hypoglycemia.  Pharmacy has been consulted for Vancomycin and Zosyn dosing.  On Admit: Hypothermic (94.8), HR 94, RR 22, WBC 8.1 LA 4.6, nCrCl 78  Plan: --Vancomycin 1500 mg IV x 1, then 1000 mg IV q12h --Zosyn 3.375g IV q8h (extended infusion) --Obtain vanc trough at steady state --Follow renal function, clinical course, and cultures       Temp (24hrs), Avg:94.8 F (34.9 C), Min:94.8 F (34.9 C), Max:94.8 F (34.9 C)  No results for input(s): WBC, CREATININE, LATICACIDVEN, VANCOTROUGH, VANCOPEAK, VANCORANDOM, GENTTROUGH, GENTPEAK, GENTRANDOM, TOBRATROUGH, TOBRAPEAK, TOBRARND, AMIKACINPEAK, AMIKACINTROU, AMIKACIN in the last 168 hours.  CrCl cannot be calculated (Unknown ideal weight.).    Allergies not on file  Antimicrobials this admission: 4/11 Zosyn >>  4/11 Vanc >>   Dose adjustments this admission:   Microbiology results: 4/11 BCx:    Thank you for allowing pharmacy to be a part of this patient's care.  Viann Fish 09/21/2015 3:29 PM

## 2015-09-21 NOTE — ED Notes (Signed)
Attempted to get head CT. Pt became combative and pulled Bi-pap off. MD Isaacs notified. Pt assisted back to bed no obvious distress at this time.

## 2015-09-21 NOTE — Progress Notes (Signed)
ANTICOAGULATION CONSULT NOTE - Initial Consult  Pharmacy Consult for Lovenox Indication: h/o DVT  Patient Measurements: Height: 6' (182.9 cm) Weight: 196 lb (88.905 kg) IBW/kg (Calculated) : 77.6   Vital Signs: Temp: 97.7 F (36.5 C) (04/11 1849) Temp Source: Oral (04/11 1849) BP: 130/103 mmHg (04/11 1930) Pulse Rate: 88 (04/11 1930)  Labs:  Recent Labs  09/21/15 1607  HGB 13.7  HCT 42.3  PLT 179  LABPROT 13.8  INR 1.04  CREATININE 1.08  CKTOTAL 176  TROPONINI <0.03    CrCl cannot be calculated (Unknown ideal weight.).   Medical History: No past medical history on file.  Medications:  No prescriptions prior to admission   Scheduled:  . aspirin EC  81 mg Oral Daily  . [START ON 09/22/2015] atorvastatin  40 mg Oral q1800  . diltiazem  90 mg Oral Q12H  . divalproex  500 mg Oral Q12H  . folic acid  1 mg Oral Daily  . multivitamin with minerals  1 tablet Oral Daily  . piperacillin-tazobactam (ZOSYN)  IV  3.375 g Intravenous Q8H  . sodium chloride flush  3 mL Intravenous Q12H  . thiamine  100 mg Oral Daily   Or  . thiamine  100 mg Intravenous Daily  . [START ON 09/22/2015] vancomycin  1,000 mg Intravenous Q12H   Infusions:  . dextrose 5 % and 0.45% NaCl 150 mL/hr at 09/21/15 1720    Assessment: 55yo male with history of DVT, Afib, HTN and DM2 presents with AMS. Pharmacy is consulted to dose lovenox for h/o DVT.   Goal of Therapy:  Monitor platelets by anticoagulation protocol: Yes   Plan:  Lovenox 1mg /kg subcutaneously q12h Monitor s/sx of bleeding   Andrey Cota. Diona Foley, PharmD, [place] Clinical Pharmacist Pager [phone] 09/21/2015,9:01 PM

## 2015-09-22 ENCOUNTER — Inpatient Hospital Stay (HOSPITAL_COMMUNITY): Payer: Medicaid Other

## 2015-09-22 DIAGNOSIS — E1159 Type 2 diabetes mellitus with other circulatory complications: Secondary | ICD-10-CM

## 2015-09-22 DIAGNOSIS — R4182 Altered mental status, unspecified: Secondary | ICD-10-CM

## 2015-09-22 LAB — BASIC METABOLIC PANEL
ANION GAP: 10 (ref 5–15)
BUN: 16 mg/dL (ref 6–20)
CO2: 21 mmol/L — AB (ref 22–32)
Calcium: 8.5 mg/dL — ABNORMAL LOW (ref 8.9–10.3)
Chloride: 106 mmol/L (ref 101–111)
Creatinine, Ser: 1.09 mg/dL (ref 0.61–1.24)
GFR calc Af Amer: >60 mL/min (ref >60)
GFR calc non Af Amer: >60 mL/min (ref >60)
GLUCOSE: 165 mg/dL — AB (ref 65–99)
POTASSIUM: 4 mmol/L (ref 3.5–5.1)
Sodium: 137 mmol/L (ref 135–145)

## 2015-09-22 LAB — GLUCOSE, CAPILLARY
GLUCOSE-CAPILLARY: 164 mg/dL — AB (ref 65–99)
GLUCOSE-CAPILLARY: 216 mg/dL — AB (ref 65–99)
GLUCOSE-CAPILLARY: 235 mg/dL — AB (ref 65–99)
Glucose-Capillary: 168 mg/dL — ABNORMAL HIGH (ref 65–99)
Glucose-Capillary: 178 mg/dL — ABNORMAL HIGH (ref 65–99)
Glucose-Capillary: 198 mg/dL — ABNORMAL HIGH (ref 65–99)
Glucose-Capillary: 203 mg/dL — ABNORMAL HIGH (ref 65–99)

## 2015-09-22 LAB — CBC
HEMATOCRIT: 33.6 % — AB (ref 39.0–52.0)
Hemoglobin: 10.8 g/dL — ABNORMAL LOW (ref 13.0–17.0)
MCH: 26.2 pg (ref 26.0–34.0)
MCHC: 32.1 g/dL (ref 30.0–36.0)
MCV: 81.6 fL (ref 78.0–100.0)
Platelets: 173 10*3/uL (ref 150–400)
RBC: 4.12 MIL/uL — AB (ref 4.22–5.81)
RDW: 15.1 % (ref 11.5–15.5)
WBC: 12.1 10*3/uL — AB (ref 4.0–10.5)

## 2015-09-22 LAB — LACTIC ACID, PLASMA: LACTIC ACID, VENOUS: 1.9 mmol/L (ref 0.5–2.0)

## 2015-09-22 LAB — MRSA PCR SCREENING: MRSA by PCR: NEGATIVE

## 2015-09-22 NOTE — Discharge Summary (Signed)
[facility] Discharge Summary  Patient name: Steven [name] Medical record number: [id number] Date of birth: 07-07-1960 Age: 55 y.o. Gender: male Date of Admission: 09/21/2015  Date of Discharge: 09/28/2015  Admitting Physician: Lind Covert, MD  Primary Care Provider: Chrisandra Netters, MD Consultants: neuro  Indication for Hospitalization: AMS, hypoglycemia  Discharge Diagnoses/Problem List:  Patient Active Problem List   Diagnosis Date Noted  . Lactic acidosis   . Hypoglycemia 09/21/2015  . Altered mental status 09/21/2015  . Severe sepsis ([place]) 09/21/2015  . HTN (hypertension) 09/21/2015  . T2DM (type 2 diabetes mellitus) ([place]) 09/21/2015  . Atrial fibrillation ([place]) 09/21/2015  . Pulmonary embolism ([place]) 09/21/2015  . Polysubstance abuse 09/21/2015  . Seizures ([place]) 09/21/2015  . Hypokalemia 09/21/2015   Disposition: SNF  Discharge Condition: stable  Discharge Exam: see progress note from day of discharge  Brief Hospital Course:  Patient presented with AMS and hypoglycemia. He was also found to be septic on admission.   AMS: Patient presented after being found down by his neighbor. GCS 10 upon EMS arrival, with CBG of 12. Patient also found to be septic, and had positive UDS (cocaine and benzos), as well as history of seizures, so there are multiple factors that could have contributed to patient's mental status. Head CT negative. Patient also with alcoholism history and concern for possible withdrawal, so he was monitored on CIWA protocol throughout admission.  Patient's mental status improved after admission and continued administration of dextrose, however he remained oriented only to person and place. EEG was performed, which showed no abnormalities.  HIV, RPR, folate, B12, TSH, and ammonia all within normal limits.  Home depakote was resumed, but it was assumed that patient was not taking this at home, given depakote level <10 on  admission.  As mental status improved slightly, agitation increased.  Haldol 5mg  q6h prn given for agitation and patient and staff safety.  Patient was never completed oriented or able to answer questions appropriately.  Psychiatry saw patient on 4/14 and deemed that he lacked capacity to make his own medical decisions or living arrangements.   Hypoglycemia: When EMS arrived, patient's CBG was 12. He was given multiple amps of D50 both en route to and while in ED, with eventual improvement in CBG after 5 amps of D50 and D5 drip. Of note, patient has had multiple episodes of hypoglycemia leading to seizures and subsequent hospitalizations, most recently two months ago.  As CBGs rose, D5 drip was discontinued and eventually Lantus 25 units qhs and SSI was resumed.  Concern for Sepsis: Patient was hypothermic (94.60F), tachycardic, and hypotensive (98/65) on admission, with elevated lactic acid. Sepsis protocol was initiated, and patient was placed on vanc/zosyn. His vital signs improved with fluid and dextrose administration. Blood and urine cultures with no growth at time of discharge.  No clear source of infection was identified by day 2 with negative UA, CXR, no signs of cellulitis, and negative cultures, so antibiotics were discontinued.  Patient remained afebrile and stable through discharge, off of all antibiotics.  HTN:  Home antihypertensives were initially held, given hypotension. As BP increased, lisinopril 20mg  daily resumed.  BP well controlled for remainder of hospitalization.  Home HCTZ never resumed.  History of Alcohol Use/Illicit Drug Use: UDS positive for cocaine and benzos on admission. Monitored on CIWA throughout admission.  Given thiamine, folate, and MVI daily.  H/o multiple PEs in 07/2015: Prescribed lovenox at home, but unclear if patient was taking this.  Lovenox  90 mg q12h was resumed and continued throughout admission.  Electrolytes repleted prn.  All other chronic medical  conditions were stable throughout admission and managed with home regimens.  Issues for Follow Up:  1. Monitor mental status - haldol prn for agitation 2. Monitor BP - can increase lisinopril dose as needed 3. F/u glycemic control and adjust insulin prn  Significant Procedures: none  Significant Labs and Imaging:   Recent Labs Lab 09/23/15 0923 09/25/15 0312 09/27/15 0751  WBC 5.0 5.1 5.3  HGB 12.6* 12.3* 13.9  HCT 37.9* 38.2* 42.3  PLT 177 206 240    Recent Labs Lab 09/21/15 1607  09/23/15 0923 09/24/15 0317 09/25/15 0312 09/26/15 0340 09/27/15 1008  NA 140  < > 139 137 137 137 137  K 3.4*  < > 4.4 4.9 3.9 3.6 4.5  CL 104  < > 107 107 104 102 103  CO2 24  < > 21* 20* 24 25 22   GLUCOSE 97  < > 217* 212* 243* 184* 236*  BUN 16  < > 6 10 9 9 13   CREATININE 1.08  < > 1.04 1.04 0.95 1.01 1.11  CALCIUM 9.3  < > 9.3 9.4 9.8 9.8 9.9  ALKPHOS 52  --   --   --   --   --   --   AST 19  --   --   --   --   --   --   ALT 16*  --   --   --   --   --   --   ALBUMIN 3.3*  --   --   --   --   --   --   < > = values in this interval not displayed.   Drugs of Abuse     Component Value Date/Time   LABOPIA NONE DETECTED 09/21/2015 1847   COCAINSCRNUR POSITIVE* 09/21/2015 1847   LABBENZ POSITIVE* 09/21/2015 1847   AMPHETMU NONE DETECTED 09/21/2015 1847   THCU NONE DETECTED 09/21/2015 1847   LABBARB NONE DETECTED 09/21/2015 1847    Urinalysis    Component Value Date/Time   COLORURINE YELLOW 09/21/2015 1847   APPEARANCEUR CLEAR 09/21/2015 1847   LABSPEC 1.013 09/21/2015 1847   PHURINE 7.0 09/21/2015 1847   GLUCOSEU 100* 09/21/2015 1847   HGBUR NEGATIVE 09/21/2015 1847   BILIRUBINUR NEGATIVE 09/21/2015 1847   KETONESUR NEGATIVE 09/21/2015 1847   PROTEINUR >300* 09/21/2015 1847   NITRITE NEGATIVE 09/21/2015 1847   LEUKOCYTESUR NEGATIVE 09/21/2015 1847    Lactic acid 4.6 > 2.8 > 2.3 > 1.9 Troponin < 0.03 CK 176 Ammonia 33 Alcohol <5 Acetaminophen Q000111Q Salicylate  <4 TSH 99991111  EKG: afib, repol abnormality  Ct Head Wo Contrast  09/21/2015  CLINICAL DATA:  Patient found unresponsive on floor at home. Altered mental status. Initial encounter. EXAM: CT HEAD WITHOUT CONTRAST TECHNIQUE: Contiguous axial images were obtained from the base of the skull through the vertex without intravenous contrast. COMPARISON:  None. FINDINGS: There is no evidence of acute infarction, mass lesion, or intra- or extra-axial hemorrhage on CT. Mild periventricular white matter change likely reflects small vessel ischemic microangiopathy. The posterior fossa, including the cerebellum, brainstem and fourth ventricle, is within normal limits. The third and lateral ventricles, and basal ganglia are unremarkable in appearance. The cerebral hemispheres are symmetric in appearance, with normal gray-white differentiation. No mass effect or midline shift is seen. There is no evidence of fracture; visualized osseous structures are unremarkable in appearance. The  visualized portions of the orbits are within normal limits. The paranasal sinuses and mastoid air cells are well-aerated. No significant soft tissue abnormalities are seen. IMPRESSION: 1. No evidence of traumatic intracranial injury or fracture. 2. Mild small vessel ischemic microangiopathy. Electronically Signed   By: Garald Balding M.D.   On: 09/21/2015 21:21   Dg Chest Portable 1 View  09/21/2015  CLINICAL DATA:  Altered mental status EXAM: PORTABLE CHEST 1 VIEW COMPARISON:  None. FINDINGS: Cardiac shadow is mildly enlarged. Mild central vascular congestion is noted. No focal confluent infiltrate is seen. No acute bony abnormality is noted. IMPRESSION: Mild vascular congestion. Electronically Signed   By: Inez Catalina M.D.   On: 09/21/2015 16:44     Results/Tests Pending at Time of Discharge: none  Discharge Medications:    Medication List    STOP taking these medications        DILTIAZEM CD 120 MG 24 hr capsule  Generic drug:   diltiazem     methocarbamol 500 MG tablet  Commonly known as:  ROBAXIN     OVER THE COUNTER MEDICATION     rivaroxaban 20 MG Tabs tablet  Commonly known as:  XARELTO      TAKE these medications        aspirin EC 81 MG tablet  Take 162 mg by mouth daily.     atorvastatin 40 MG tablet  Commonly known as:  LIPITOR  Take 1 tablet (40 mg total) by mouth daily at 6 PM.     cetirizine 10 MG tablet  Commonly known as:  ZYRTEC  Take 10 mg by mouth at bedtime.     diltiazem 90 MG 12 hr capsule  Commonly known as:  CARDIZEM SR  Take 1 capsule (90 mg total) by mouth every 12 (twelve) hours.     divalproex 500 MG DR tablet  Commonly known as:  DEPAKOTE  Take 1 tablet (500 mg total) by mouth every 12 (twelve) hours.     enoxaparin 150 MG/ML injection  Commonly known as:  LOVENOX  Inject 0.59 mLs (90 mg total) into the skin every 12 (twelve) hours.     haloperidol 5 MG tablet  Commonly known as:  HALDOL  Take 1 tablet (5 mg total) by mouth every 6 (six) hours as needed for agitation.     insulin aspart 100 UNIT/ML injection  Commonly known as:  novoLOG  Inject 0-9 Units into the skin 3 (three) times daily with meals.     insulin glargine 100 UNIT/ML injection  Commonly known as:  LANTUS  Inject 0.25 mLs (25 Units total) into the skin at bedtime.     lisinopril 20 MG tablet  Commonly known as:  PRINIVIL,ZESTRIL  Take 20 mg by mouth daily.        Discharge Instructions: Please refer to Patient Instructions section of EMR for full details.  Patient was counseled important signs and symptoms that should prompt return to medical care, changes in medications, dietary instructions, activity restrictions, and follow up appointments.   Follow-Up Appointments: Follow-up Information    Follow up with Chrisandra Netters, MD.   Specialty:  Family Medicine   Why:  For hospital follow-up after discharge from SNF   Contact information:   [place]  [address] [phone]       Virginia Crews, MD 09/28/2015, 2:02 PM PGY-2, [place]

## 2015-09-22 NOTE — Procedures (Signed)
ELECTROENCEPHALOGRAM REPORT  Date of Study: 09/22/2015  Patient's Name: Romin Freese MRN: [id number] Date of Birth: 10/15/1960  Referring Provider: Lavon Paganini, MD  Indication: 55 year old man with altered mental status and hypoglycemia  Medications: Depakote Ativan ASA 81mg  Lipitor 40mg  Cardizem   Technical Summary: This is a multichannel digital EEG recording, using the international 10-20 placement system with electrodes applied with paste and impedances below 5000 ohms.    Description: The EEG is recorded in the drowsy state with a background that is symmetric and a posterior dominant rhythm of 7 Hz but does reach up to 8 Hz, which is reactive to eye opening and closing.  Diffuse beta activity is seen, with a bilateral frontal preponderance.  No focal or generalized abnormalities are seen.  No focal or generalized epileptiform discharges are seen.  Stage II sleep is not seen.  Hyperventilation and photic stimulation were not performed.  ECG revealed normal cardiac rate and rhythm.  Impression: This is a normal routine EEG of the drowsy state, without activating procedures.  Adam R. Tomi Likens, DO

## 2015-09-22 NOTE — Progress Notes (Signed)
Family Medicine Teaching Service Daily Progress Note Intern Pager: [phone]  Patient name: Steven [name] Medical record number: [id number] Date of birth: 25-Jan-1961 Age: 55 y.o. Gender: male  Primary Care Provider: Chrisandra Netters, MD Consultants: None Code Status: FULL - discussed with patient after MS improved  Pt Overview and Major Events to Date:  4/11 - admitted to FPTS  Assessment and Plan: Steven [name] is a 55 y.o. male presenting with altered mental status . 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  # Altered Mental Status: Found unconscious at home by neighbor. GCS score 10. Hypoglycemia managed as below, suspected to be contributing. Also possible that sepsis contributing (see below). Head CT without acute intracranial abnormalities. No seizure activity noted, but patient does have history of seizure related to hypoglycemia noted in chart. For medication list review, it seems he is supposed be taking Depakote 500 mg twice a day. Unclear if he is taking this. Also possible this could be related to alcohol withdrawal, cocaine use. UDS positive for benzos and cocaine. Post ictal state also possible, though no seizure activity reported. Patient does not appear to be in status epilepticus currently. Home depakote <10 so unsure if taking at home. Ammonia WNL. EEG performed this AM with no abnormalities.  - Treatment for sepsis and hypoglycemia as below - CIWA protocol - Consider Haldol prn for agitation - Resume home Depakote   #Severe Sepsis, unclear source: On presentation, hypothermic to 94.8, tachycardic (per report), and hypotensive to 98/65. Lactic acid 4.6 and improved to 2.8 after 2L NS bolus. No obvious source with noninfectious urine, clear CXR, and no skin rashes or open wounds, and no meningeal signs. Lactic acid normalized. WBC  elevated (12.1 from 8.1 yesterday). Remains afebrile.  - Continue Vanc/Zosyn for broad coverage - f/u UCx and BCx (only one seems to have been collected prior to admin of abx)  # Hypoglycemia, T2DM: History of insulin dependent diabetes with multiple prior admissions for hypoglycemia. Suspected to be contributing to altered mental status. Hypoglycemia noted with initial CBG 12--given 1amp of D50 prior to arrival to ED. Another 4 amps of D50 given and initiated D5. Last A1C 9.9 on 08/03/15. No clear records of insulin dosing at home, also on Metformin. CBGs improved to 168, 178 this AM.  - Hold diabetic medications - Monitor CBGs q4h - Continue D5 1/2 NS  # Hypokalemia: mild, K 3.4 on admission. Improved to 4 after KDur.  - Cont to monitor  # HTN: BPs low on presentation, now slightly hypertensive (150/80, 159/82). Prescribed Lisinopril 20mg  and HCTZ 12.5 mg daily. - Holding oral antihypertensives in setting of severe sepsis as above - Monitor closely  # H/o multiple PEs in 07/2015: Patient was discharged on treatment dose Lovenox. Unclear if patient is currently taking this or other anticoagulation. No current evidence of acute VTE - Resume treatment dose Lovenox  # Atrial Fibrillation: Currently prescribed Diltiazem 180mg  daily, Lovenox, and Aspirin 81mg .Unclear if patient is taking these regularly. - Avoid beta blockers in setting of cocaine use - Continue diltiazem 90 mg twice a day - monitor BP closely - Continue home aspirin - Anticoagulation with Lovenox  # History of Alcohol Use/Illicit Drug Use: UDS positive for cocaine and benzos. Patient unable to provide history on last drink or other substance use. CIWA scores overnight 6 and 0.  - CIWA protocol - thiamine, folate, and  MVI daily  FEN/GI: Continue D5-1/2NS @ 144mL/hr, carb mod diet Prophylaxis: treatment dose lovenox  Disposition: pending medical improvement   Subjective:  Patient sluggish but responding appropriately  to questions this morning. Oriented to person and place (knew specifically he was at [facility]), but unable to name date/year or president. Also did not know why he was in the hospital, and says he cannot remember anything prior to waking up in the hospital.  Has no complaints today. Denies any pain.   Objective: Temp:  [94.8 F (34.9 C)-98.7 F (37.1 C)] 98.6 F (37 C) (04/12 0717) Pulse Rate:  [72-98] 84 (04/12 0800) Resp:  [14-22] 14 (04/12 0800) BP: (98-159)/(62-103) 159/82 mmHg (04/12 1052) SpO2:  [95 %-100 %] 97 % (04/12 0800) FiO2 (%):  [40 %] 40 % (04/11 1741) Weight:  [196 lb (88.905 kg)] 196 lb (88.905 kg) (04/11 2100) Physical Exam: General: lying in bed, lethargic but responding appropriately to questions Cardiovascular: RRR, no murmurs appreciated Respiratory: CTAB, no wheezes or rhonchi Abdomen: soft, non-tender, non-distended, +BS Extremities: R AKA and L BKA, well-healing stumps, no edema or erythema Neuro: Alert, oriented to person and place, moving all four extremities spontaneously, no focal deficits Psych: flat affect; no longer tangential or perseverative   Laboratory:  Recent Labs Lab 09/21/15 1607 09/22/15 0314  WBC 8.1 12.1*  HGB 13.7 10.8*  HCT 42.3 33.6*  PLT 179 173    Recent Labs Lab 09/21/15 1607 09/22/15 0314  NA 140 137  K 3.4* 4.0  CL 104 106  CO2 24 21*  BUN 16 16  CREATININE 1.08 1.09  CALCIUM 9.3 8.5*  PROT 6.9  --   BILITOT 0.6  --   ALKPHOS 52  --   ALT 16*  --   AST 19  --   GLUCOSE 97 165*    Imaging/Diagnostic Tests: Ct Head Wo Contrast 09/21/2015  CLINICAL DATA:  Patient found unresponsive on floor at home. Altered mental status. Initial encounter. EXAM: CT HEAD WITHOUT CONTRAST. IMPRESSION: 1. No evidence of traumatic intracranial injury or fracture. 2. Mild small vessel ischemic microangiopathy.   Dg Chest Portable 1 View 09/21/2015  CLINICAL DATA:  Altered mental status EXAM: PORTABLE CHEST 1 VIEW.  IMPRESSION: Mild vascular congestion. Electronically Signed   By: Inez Catalina M.D.   On: 09/21/2015 16:44    [place], MD 09/22/2015, 12:20 PM PGY-1, [place] Intern pager: [phone], text pages welcome

## 2015-09-22 NOTE — ED Provider Notes (Signed)
Patient presented to the emergency room with hypoglycemia and altered mental status.  Patient was last seen by family members about 10:30 last evening. eMS found the patient unresponsive with a CBG of 12. Patient's blood sugar improved to 42 and he became more combative however he remained lethargic. Physical Exam  BP 117/74 mmHg  Pulse 88  Temp(Src) 98.4 F (36.9 C) (Oral)  Resp 17  Ht 6' (1.829 m)  Wt 88.905 kg  BMI 26.58 kg/m2  SpO2 97%  Physical Exam  Constitutional:  Initially combative, not answering questions  HENT:  Head: Normocephalic and atraumatic.  Right Ear: External ear normal.  Left Ear: External ear normal.  Eyes: Conjunctivae are normal. Right eye exhibits no discharge. Left eye exhibits no discharge. No scleral icterus.  Neck: Neck supple. No tracheal deviation present.  Cardiovascular: Normal rate.   Pulmonary/Chest: Effort normal. No stridor. No respiratory distress.  Musculoskeletal: He exhibits no edema.  Status post bilateral BKA lower extremities  Neurological: Cranial nerve deficit: no gross deficits.  Skin: Skin is warm and dry. No rash noted.  Psychiatric: He has a normal mood and affect.  Nursing note and vitals reviewed.   ED Course  Procedures CRITICAL CARE Performed by: [id number] Total critical care time: 35 minutes Critical care time was exclusive of separately billable procedures and treating other patients. Critical care was necessary to treat or prevent imminent or life-threatening deterioration. Critical care was time spent personally by me on the following activities: development of treatment plan with patient and/or surrogate as well as nursing, discussions with consultants, evaluation of patient's response to treatment, examination of patient, obtaining history from patient or surrogate, ordering and performing treatments and interventions, ordering and review of laboratory studies, ordering and review of radiographic studies, pulse  oximetry and re-evaluation of patient's condition.  MDM Patient's blood sugar slowly improved while he was in the emergency room. He was also treated empirically with antibiotics for possible sepsis.  While in the emergency room the patient became more alert and awake over several hours. Eventually he was sitting up eating and conversing normally.  Admitted to the hospital in stable condition.    Dorie Rank, MD 09/22/15 [phone]

## 2015-09-22 NOTE — Progress Notes (Signed)
EEG completed; results pending.    

## 2015-09-23 DIAGNOSIS — E1151 Type 2 diabetes mellitus with diabetic peripheral angiopathy without gangrene: Secondary | ICD-10-CM | POA: Insufficient documentation

## 2015-09-23 DIAGNOSIS — IMO0002 Reserved for concepts with insufficient information to code with codable children: Secondary | ICD-10-CM | POA: Insufficient documentation

## 2015-09-23 DIAGNOSIS — E1165 Type 2 diabetes mellitus with hyperglycemia: Principal | ICD-10-CM

## 2015-09-23 DIAGNOSIS — E872 Acidosis, unspecified: Secondary | ICD-10-CM | POA: Insufficient documentation

## 2015-09-23 LAB — CBC
HEMATOCRIT: 37.9 % — AB (ref 39.0–52.0)
Hemoglobin: 12.6 g/dL — ABNORMAL LOW (ref 13.0–17.0)
MCH: 27.3 pg (ref 26.0–34.0)
MCHC: 33.2 g/dL (ref 30.0–36.0)
MCV: 82 fL (ref 78.0–100.0)
PLATELETS: 177 10*3/uL (ref 150–400)
RBC: 4.62 MIL/uL (ref 4.22–5.81)
RDW: 15.3 % (ref 11.5–15.5)
WBC: 5 10*3/uL (ref 4.0–10.5)

## 2015-09-23 LAB — BASIC METABOLIC PANEL
ANION GAP: 11 (ref 5–15)
BUN: 6 mg/dL (ref 6–20)
CALCIUM: 9.3 mg/dL (ref 8.9–10.3)
CO2: 21 mmol/L — ABNORMAL LOW (ref 22–32)
Chloride: 107 mmol/L (ref 101–111)
Creatinine, Ser: 1.04 mg/dL (ref 0.61–1.24)
GFR calc Af Amer: >60 mL/min (ref >60)
GLUCOSE: 217 mg/dL — AB (ref 65–99)
Potassium: 4.4 mmol/L (ref 3.5–5.1)
Sodium: 139 mmol/L (ref 135–145)

## 2015-09-23 LAB — GLUCOSE, CAPILLARY
GLUCOSE-CAPILLARY: 183 mg/dL — AB (ref 65–99)
GLUCOSE-CAPILLARY: 208 mg/dL — AB (ref 65–99)
Glucose-Capillary: 203 mg/dL — ABNORMAL HIGH (ref 65–99)
Glucose-Capillary: 221 mg/dL — ABNORMAL HIGH (ref 65–99)
Glucose-Capillary: 229 mg/dL — ABNORMAL HIGH (ref 65–99)
Glucose-Capillary: 279 mg/dL — ABNORMAL HIGH (ref 65–99)

## 2015-09-23 MED ORDER — LORAZEPAM 2 MG/ML IJ SOLN
1.0000 mg | Freq: Once | INTRAMUSCULAR | Status: AC
  Administered 2015-09-23: 1 mg via INTRAMUSCULAR
  Filled 2015-09-23: qty 1

## 2015-09-23 MED ORDER — SODIUM CHLORIDE 0.45 % IV SOLN
INTRAVENOUS | Status: DC
  Administered 2015-09-23: 22:00:00 via INTRAVENOUS
  Administered 2015-09-23: 1000 mL via INTRAVENOUS
  Administered 2015-09-24: 05:00:00 via INTRAVENOUS

## 2015-09-23 NOTE — Progress Notes (Signed)
Family Medicine Teaching Service Daily Progress Note Intern Pager: [phone]  Patient name: Steven [name] Medical record number: [id number] Date of birth: 08/30/1960 Age: 55 y.o. Gender: male  Primary Care Provider: Chrisandra Netters, MD Consultants: None Code Status: FULL - discussed with patient after MS improved  Pt Overview and Major Events to Date:  4/11 - admitted to FPTS  Assessment and Plan: Steven [name] is a 55 y.o. male presenting with altered mental status . 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  # Altered Mental Status: Head CT negative. Differential sepsis, hypoglycemia (CBG 12 prior to arrival to ED), seizure (nl EEG but valproic acid level <10), substance (UDS +cocaine and benzo) & EtOH (level <10). VBG 7.25/60/34/26. Ammonia normal. - CIWA protocol - Consider Haldol prn for agitation - Home Depakote  - Fall precaution -Talked to sister who saw him last 6 days ago. She says he was able to keep up conversation with her at that time. She learned his hospitalization this morning.  -Psych consult for capacity. No answer. Will try again in the afternoon  #Concern for Sepsis, unclear source: resolved. 4/4 SIRS (hypothermia to 94.8, tachycardia (per report), hypotensive to 98/65 and leucocytosis to 12.1) on presentation. Lactic acid 4.6, resolved after fluid. UA only with protienuria, clear CXR, and no skin rashes or open wounds, and no meningeal signs. - f/u BCx-NGTD -F/u CBC -Discontinue Vanc/Zosyn  # Hypoglycemia, T2DM: Hypoglycemia resolved. CBG 12--given 1amp of D50 prior to arrival to ED and nother 4 amps of D50 given and initiated D5. History of AMS 2/2 hypoglycemia in the past. Last A1C 9.9 on 08/03/15. No clear records of insulin dosing at home, also on Metformin.  - Monitor CBGs q4h - SSI thin - Change D5 1/2 NS to 1/2  NS.  # Hypokalemia: mild, K 3.4 >> 4 after KDur.  - f/u BMP  # HTN: slightly hypertensive. Prescribed Lisinopril 20mg  and HCTZ 12.5 mg daily. - Diltiazem SR 90 mg twice a day  - Monitor closely  # H/o multiple PEs in 07/2015: supposedly on lovenox at home - Lovenox 90 mg q12h  # Atrial Fibrillation:  - Diltiazem 90 mg twice a day, Lovenox and Aspirin 81mg . - Avoid beta blockers in setting of cocaine use  # History of Alcohol Use/Illicit Drug Use: UDS positive for cocaine and benzos. CIWA scores overnight 14 and 8. Received lorazepam 1 mg - CIWA protocol - thiamine, folate, and MVI daily  FEN/GI:  -Heart healthy carb mod diet -IVF as above  Prophylaxis: on lovenox  Disposition: transfer to floor with fall precaution  Subjective:  Patient oriented only to self. Barely follows command. No sign of distress or respiratory depression.  Objective: Temp:  [98 F (36.7 C)-98.8 F (37.1 C)] 98 F (36.7 C) (04/13 0347) Pulse Rate:  [71-84] 71 (04/13 0347) Resp:  [14-18] 18 (04/12 2000) BP: (117-159)/(65-90) 145/78 mmHg (04/13 0347) SpO2:  [96 %-99 %] 96 % (04/13 0347) Physical Exam: General: lying in bed, able to sit up in bed Cardiovascular: RRR, no murmurs appreciated Respiratory: CTAB, no wheezes or rhonchi Abdomen: soft, non-tender, non-distended, +BS Extremities: R AKA and L BKA, well-healing stumps, no edema or erythema Neuro: Alert, oriented only to self. Not able assess fully due to poor cooperation. But no gross deficit Psych: flat affect  Laboratory:  Recent Labs Lab 09/21/15 1607 09/22/15 0314  WBC 8.1 12.1*  HGB 13.7 10.8*  HCT 42.3 33.6*  PLT 179 173    Recent Labs Lab 09/21/15 1607 09/22/15 0314  NA 140 137  K 3.4* 4.0  CL 104 106  CO2 24 21*  BUN 16 16  CREATININE 1.08 1.09  CALCIUM 9.3 8.5*  PROT 6.9  --   BILITOT 0.6  --   ALKPHOS 52  --   ALT 16*  --   AST 19  --   GLUCOSE 97 165*   Imaging/Diagnostic Tests: Ct Head Wo  Contrast 09/21/2015  CLINICAL DATA:  Patient found unresponsive on floor at home. Altered mental status. Initial encounter. EXAM: CT HEAD WITHOUT CONTRAST. IMPRESSION: 1. No evidence of traumatic intracranial injury or fracture. 2. Mild small vessel ischemic microangiopathy.   Dg Chest Portable 1 View 09/21/2015  CLINICAL DATA:  Altered mental status EXAM: PORTABLE CHEST 1 VIEW. IMPRESSION: Mild vascular congestion. Electronically Signed   By: Inez Catalina M.D.   On: 09/21/2015 16:44    Mercy Riding, MD 09/23/2015, 7:01 AM PGY-1, [place] Intern pager: [phone], text pages welcome

## 2015-09-23 NOTE — Progress Notes (Signed)
Pt has been alert and oriented to self only.  Pt states he does not know the month, year, or where he is.  RN asked, are you in the [place]? Pt said, "yes, the main [place], that's right".  RN reoriented him, but he continues to be confused upon reassessment.  He urinated/had BM in the bed, pt is very confused about how to use the urinal.  He gets very agitated when his bed alarm goes off and was requesting for his shoes to be put on? RN will continue to monitor.

## 2015-09-23 NOTE — Progress Notes (Signed)
Received report from [facility], [place] - 2S. Informed CIWA, fall within hour, oriented to self only with repeated efforts to get OOB - note Rt AKA, Lt BKA. Called back to [facility] - informed RN 2W02 end of the hall, need to call MD for order for sitter.

## 2015-09-23 NOTE — Progress Notes (Signed)
Patient confused and combative, reported arms were "hot" and pulled out all IV's, as well as name bands.  Before 2nd IV was pulled, attempted to administer Ativan IVP, IV was leaking/was not working correctly.  Became more combative, got an order for Ativan IM .18ml, IM injection was given, and now patient is laying back resting in bed.  Will continue to monitor.   Claudean Kinds, RN

## 2015-09-23 NOTE — Progress Notes (Signed)
Pt found in floor. No apparent injury. Patient confused. Did not know his name when questioned.

## 2015-09-23 NOTE — Progress Notes (Signed)
Family Medicine Teaching Service Daily Progress Note Intern Pager: [phone]  Patient name: Steven [name] Medical record number: [id number] Date of birth: Feb 26, 1961 Age: 55 y.o. Gender: male  Primary Care Provider: Chrisandra Netters, MD Consultants:  Psychiatry Code Status: FULL - discussed with patient after MS improved  Pt Overview and Major Events to Date:  4/11 - admitted to [facility]; vanc/zosyn started 4/13 - vanc/zosyn d/ced  Assessment and Plan: Hewlett Barrueta is a 55 y.o. male presenting with altered mental status . 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  # Altered Mental Status: Head CT negative. Differential sepsis, hypoglycemia (CBG 12 prior to arrival to ED), seizure (nl EEG but valproic acid level <10), substance (UDS +cocaine and benzo) & EtOH (level <10). VBG 7.25/60/34/26. Ammonia normal. CIWA scores 15, 4, 4 overnight.  - CIWA protocol - Ativan PRN agitation - Home Depakote  - Fall precautions - Psych consulted - will evaluate for capacity on 4/17  # Concern for Sepsis, unclear source: resolved. 4/4 SIRS (hypothermia to 94.8, tachycardia (per report), hypotensive to 98/65 and leucocytosis to 12.1) on presentation. Lactic acid 4.6, resolved after fluid. UA only with protienuria, clear CXR, and no skin rashes or open wounds, and no meningeal signs. Remains afebrile. - F/u BCx - NGx2d - F/u CBC  # Hypoglycemia, T2DM: Hypoglycemia resolved. CBG 12 - given 1amp of D50 prior to arrival to ED and another 4 amps of D50 given and initiated D5. History of AMS 2/2 hypoglycemia in the past. Last A1C 9.9 on 08/03/15. No clear records of insulin dosing at home, also on Metformin.  CBGs 195, 203 overnight.  - Monitor CBGs q4h - SSI thin  # Hypokalemia: mild, K 3.4 > 4 after KDur. K 4.9 this AM.  - Continue to monitor  # HTN: slightly  hypertensive. Prescribed Lisinopril 20mg  and HCTZ 12.5 mg daily. BP 178/88, 139/83 overnight.  - Monitor closely and resume antihypertensives as indicated  # H/o multiple PEs in 07/2015: Prescribed lovenox at home. - Lovenox 90 mg q12h  # Atrial Fibrillation:  - Diltiazem 90 mg twice a day, Lovenox and Aspirin 81mg . - Avoid beta blockers in setting of cocaine use  # History of Alcohol Use/Illicit Drug Use: UDS positive for cocaine and benzos. CIWA scores overnight 15, 4, 4.  - CIWA protocol - Thiamine, folate, and MVI daily  FEN/GI:  Heart healthy carb mod diet; 1/2NS@125  mL/hr Prophylaxis: Lovenox  Disposition: pending medical improvement   Subjective:  Patient with agitation overnight requiring Ativan. Very difficult to arouse this AM, despite sternal rub. Patient would not open eyes to command or answer any questions. Moaning intermittently, but no other response. Per nursing, he was more alert earlier in the night, sitting up in bed at times, but still not answering questions appropriately at that time.   Objective: Temp:  [97.7 F (36.5 C)-98.7 F (37.1 C)] 97.7 F (36.5 C) (04/14 0400) Pulse Rate:  [63-79] 74 (04/14 0400) Resp:  [18-19] 18 (04/14 0400) BP: (129-178)/(80-89) 139/83 mmHg (04/14 0400) SpO2:  [96 %-100 %] 99 % (04/14 0400) Weight:  [201 lb 4.8 oz (91.309 kg)] 201 lb 4.8 oz (91.309 kg) (04/14 0400) Physical Exam: General: sleeping in bed, very difficult to arouse despite sternal rub, would not open eyes to command or answer any questions  Cardiovascular: RRR, no murmurs appreciated Respiratory: CTAB, no wheezes or rhonchi Abdomen: soft,  non-tender, non-distended, +BS Extremities: R AKA and L BKA, well-healing stumps, no edema or erythema Neuro: Unable to assess as patient not answering questions or following commands  Laboratory:  Recent Labs Lab 09/21/15 1607 09/22/15 0314 09/23/15 0923  WBC 8.1 12.1* 5.0  HGB 13.7 10.8* 12.6*  HCT 42.3 33.6* 37.9*   PLT 179 173 177    Recent Labs Lab 09/21/15 1607 09/22/15 0314 09/23/15 0923 09/24/15 0317  NA 140 137 139 137  K 3.4* 4.0 4.4 4.9  CL 104 106 107 107  CO2 24 21* 21* 20*  BUN 16 16 6 10   CREATININE 1.08 1.09 1.04 1.04  CALCIUM 9.3 8.5* 9.3 9.4  PROT 6.9  --   --   --   BILITOT 0.6  --   --   --   ALKPHOS 52  --   --   --   ALT 16*  --   --   --   AST 19  --   --   --   GLUCOSE 97 165* 217* 212*   Imaging/Diagnostic Tests: Ct Head Wo Contrast 09/21/2015  CLINICAL DATA:  Patient found unresponsive on floor at home. Altered mental status. Initial encounter. EXAM: CT HEAD WITHOUT CONTRAST. IMPRESSION: 1. No evidence of traumatic intracranial injury or fracture. 2. Mild small vessel ischemic microangiopathy.   Dg Chest Portable 1 View 09/21/2015  CLINICAL DATA:  Altered mental status EXAM: PORTABLE CHEST 1 VIEW. IMPRESSION: Mild vascular congestion. Electronically Signed   By: Inez Catalina M.D.   On: 09/21/2015 16:44    [place], MD 09/24/2015, 7:07 AM PGY-1, [place] Intern pager: [phone], text pages welcome

## 2015-09-23 NOTE — Progress Notes (Signed)
Received return call from tx page to Dr. Cyndia Skeeters - requested order for a sitter for safety for this patient. Transfer to 2W - this RN feels important r/t patient per report and documentation - orient only to self, impaired mobility r/t Rt AKA, Lt BKA; Hx of fall 2C shortly after 1100, high CIWA scores. This floor has moved from 2W02 [end of the hall] to a bed near the NN and a camera room. Dr. Cyndia Skeeters agreed and stated he would order a sitter.

## 2015-09-24 DIAGNOSIS — F1996 Other psychoactive substance use, unspecified with psychoactive substance-induced persisting amnestic disorder: Secondary | ICD-10-CM

## 2015-09-24 LAB — GLUCOSE, CAPILLARY
GLUCOSE-CAPILLARY: 195 mg/dL — AB (ref 65–99)
GLUCOSE-CAPILLARY: 195 mg/dL — AB (ref 65–99)
Glucose-Capillary: 207 mg/dL — ABNORMAL HIGH (ref 65–99)
Glucose-Capillary: 276 mg/dL — ABNORMAL HIGH (ref 65–99)

## 2015-09-24 LAB — BASIC METABOLIC PANEL
Anion gap: 10 (ref 5–15)
BUN: 10 mg/dL (ref 6–20)
CO2: 20 mmol/L — ABNORMAL LOW (ref 22–32)
CREATININE: 1.04 mg/dL (ref 0.61–1.24)
Calcium: 9.4 mg/dL (ref 8.9–10.3)
Chloride: 107 mmol/L (ref 101–111)
GFR calc Af Amer: >60 mL/min (ref >60)
GLUCOSE: 212 mg/dL — AB (ref 65–99)
Potassium: 4.9 mmol/L (ref 3.5–5.1)
SODIUM: 137 mmol/L (ref 135–145)

## 2015-09-24 MED ORDER — LORAZEPAM 2 MG/ML IJ SOLN: 1.0000 mg | Freq: Four times a day (QID) | INTRAMUSCULAR | Status: DC | PRN

## 2015-09-24 MED ORDER — HALOPERIDOL 5 MG PO TABS
5.0000 mg | ORAL_TABLET | Freq: Once | ORAL | Status: AC
  Filled 2015-09-24: qty 1

## 2015-09-24 MED ORDER — HALOPERIDOL LACTATE 5 MG/ML IJ SOLN
5.0000 mg | Freq: Once | INTRAMUSCULAR | Status: AC
  Administered 2015-09-24: 5 mg via INTRAMUSCULAR
  Filled 2015-09-24: qty 1

## 2015-09-24 MED ORDER — LORAZEPAM 1 MG PO TABS: 1.0000 mg | ORAL_TABLET | Freq: Four times a day (QID) | ORAL | Status: DC | PRN

## 2015-09-24 NOTE — Progress Notes (Signed)
[facility] for Lovenox Indication:hx of afib,  Hx of PE (2/17)  Patient Measurements: Height: 6' (182.9 cm) Weight: 201 lb 4.8 oz (91.309 kg) IBW/kg (Calculated) : 77.6   Vital Signs: Temp: 97.7 F (36.5 C) (04/14 0400) Temp Source: Oral (04/14 0400) BP: 139/83 mmHg (04/14 0400) Pulse Rate: 74 (04/14 0400)  Labs:  Recent Labs  09/21/15 1607 09/22/15 0314 09/23/15 0923 09/24/15 0317  HGB 13.7 10.8* 12.6*  --   HCT 42.3 33.6* 37.9*  --   PLT 179 173 177  --   LABPROT 13.8  --   --   --   INR 1.04  --   --   --   CREATININE 1.08 1.09 1.04 1.04  CKTOTAL 176  --   --   --   TROPONINI <0.03  --   --   --     Estimated Creatinine Clearance: 87.1 mL/min (by C-G formula based on Cr of 1.04).   Medical History: No past medical history on file.  Medications:  No prescriptions prior to admission   Scheduled:  . aspirin EC  81 mg Oral Daily  . atorvastatin  40 mg Oral q1800  . diltiazem  90 mg Oral Q12H  . divalproex  500 mg Oral Q12H  . enoxaparin (LOVENOX) injection  1 mg/kg Subcutaneous Q12H  . folic acid  1 mg Oral Daily  . multivitamin with minerals  1 tablet Oral Daily  . sodium chloride flush  3 mL Intravenous Q12H  . thiamine  100 mg Oral Daily   Or  . thiamine  100 mg Intravenous Daily   Assessment: 55yo male with history of DVT, Afib, HTN and DM2 presents with AMS. Pharmacy is consulted to dose lovenox for recent h/o PE.   Renal function is normal, pharmacy will sign off for now and follow peripherally. Will continue cbc q72 hours while hospitalized. CBC ok on 4/13.  Goal of Therapy:  Monitor platelets by anticoagulation protocol: Yes   Plan:  Lovenox 1mg /kg subcutaneously q12h CBC q72h Monitor s/sx of bleeding  Erin Hearing PharmD., BCPS Clinical Pharmacist Pager [phone] 09/24/2015 10:14 AM

## 2015-09-24 NOTE — Evaluation (Signed)
Occupational Therapy Evaluation and Discharge Patient Details Name: Steven [name] MRN: [id number] DOB: 01/29/1961 Today's Date: 09/24/2015    History of Present Illness Pt admitted with AMS.  PMH: DM, HTN, afib, polysubstance abuse, PVD, R AKA, L BKA.   Clinical Impression   Pt reports using a w/c for mobility and transferring independently prior to admission. Pt not able to offer PLOF in ADL and IADL due to impaired cognition. Pt presents with generalized weakness and requires min guard assistance for lateral transfers, min assist for w/c mobility and min assist for seated grooming at sink. Pt will need SNF upon discharge.     Follow Up Recommendations  SNF;Supervision/Assistance - 24 hour    Equipment Recommendations       Recommendations for Other Services       Precautions / Restrictions Precautions Precautions: Fall      Mobility Bed Mobility Overal bed mobility: Needs Assistance Bed Mobility: Supine to Sit     Supine to sit: Supervision     General bed mobility comments: pt able to perform supine to long sitting without physical assist  Transfers Overall transfer level: Needs assistance   Transfers: Lateral/Scoot Transfers          Lateral/Scoot Transfers: Min guard General transfer comment: multimodal cues to for brakes, min guard for safety and to manage clothing    Balance Overall balance assessment: Needs assistance   Sitting balance-Leahy Scale: Good                                      ADL Overall ADL's : Needs assistance/impaired Eating/Feeding: Supervision/ safety;Sitting   Grooming: Wash/dry hands;Minimal assistance;Sitting Grooming Details (indicate cue type and reason): at sink, assist to sequence Upper Body Bathing: Minimal assitance;Sitting   Lower Body Bathing: Minimal assistance;Sitting/lateral leans   Upper Body Dressing : Minimal assistance;Sitting Upper Body Dressing Details (indicate cue type and reason):  assist to orient gown Lower Body Dressing: Minimal assistance;Sitting/lateral leans   Toilet Transfer: Min guard (lateral transfer) Toilet Transfer Details (indicate cue type and reason): simulated to w/c   Toileting - Clothing Manipulation Details (indicate cue type and reason): pt has been urinating in the bed, aware what urinal is used for when questioned     Functional mobility during ADLs: Minimal assistance;Wheelchair (poor negotiation around obstacles and turns)       Estate agent      Pertinent Vitals/Pain Pain Assessment: Faces Faces Pain Scale: No hurt     Hand Dominance     Extremity/Trunk Assessment Upper Extremity Assessment Upper Extremity Assessment: Overall WFL for tasks assessed (fatigues easily)   Lower Extremity Assessment Lower Extremity Assessment: Defer to PT evaluation   Cervical / Trunk Assessment Cervical / Trunk Assessment: Normal   Communication Communication Communication: No difficulties   Cognition Arousal/Alertness: Awake/alert Behavior During Therapy: Flat affect Overall Cognitive Status: Impaired/Different from baseline Area of Impairment: Orientation;Attention;Memory;Following commands;Safety/judgement;Problem solving;Awareness Orientation Level: Disoriented to;Place;Time;Situation Current Attention Level: Focused Memory: Decreased short-term memory;Decreased recall of precautions Following Commands: Follows one step commands inconsistently;Follows one step commands with increased time Safety/Judgement: Decreased awareness of safety;Decreased awareness of deficits Awareness: Intellectual Problem Solving: Slow processing;Decreased initiation;Difficulty sequencing;Requires verbal cues;Requires tactile cues     General Comments       Exercises       Shoulder Instructions      Home Living Family/patient expects to  be discharged to:: Skilled nursing facility                                         Prior Functioning/Environment Level of Independence: Independent with assistive device(s)        Comments: used a manual w/c and performed lateral transfers independently    OT Diagnosis: Cognitive deficits   OT Problem List:     OT Treatment/Interventions:      OT Goals(Current goals can be found in the care plan section) Acute Rehab OT Goals Patient Stated Goal: did not state  OT Frequency:     Barriers to D/C:            Co-evaluation PT/OT/SLP Co-Evaluation/Treatment: Yes Reason for Co-Treatment: Necessary to address cognition/behavior during functional activity   OT goals addressed during session: ADL's and self-care      End of Session Nurse Communication: Mobility status  Activity Tolerance: Patient tolerated treatment well Patient left: in bed;with call bell/phone within reach;with bed alarm set   Time: [id number] OT Time Calculation (min): 27 min Charges:  OT General Charges $OT Visit: 1 Procedure OT Evaluation $OT Eval Moderate Complexity: 1 Procedure G-Codes:    Malka So 09/24/2015, 1:57 PM [phone]

## 2015-09-24 NOTE — NC FL2 (Signed)
[place] MEDICAID FL2 LEVEL OF CARE SCREENING TOOL     IDENTIFICATION  Patient Name: Steven [name] Birthdate: 1960/12/01 Sex: male Admission Date (Current Location): 09/21/2015  [facility] and [place] Number:  Herbalist and Address:  [facility], [place] [address], [place], [place] [address]      Provider Number: [id number]  Attending Physician Name and Address:  Lind Covert, MD  Relative Name and Phone Number:       Current Level of Care: Hospital Recommended Level of Care: [place] Prior Approval Number:    Date Approved/Denied:   PASRR Number: [id number]  Discharge Plan: SNF    Current Diagnoses: Patient Active Problem List   Diagnosis Date Noted  . Lactic acidosis   . Hypoglycemia 09/21/2015  . Altered mental status 09/21/2015  . Severe sepsis ([place]) 09/21/2015  . HTN (hypertension) 09/21/2015  . T2DM (type 2 diabetes mellitus) ([place]) 09/21/2015  . Atrial fibrillation ([place]) 09/21/2015  . Pulmonary embolism ([place]) 09/21/2015  . Polysubstance abuse 09/21/2015  . Seizures ([place]) 09/21/2015  . Hypokalemia 09/21/2015    Orientation RESPIRATION BLADDER Height & Weight     Self  Normal Continent Weight: 91.309 kg (201 lb 4.8 oz) Height:  6' (182.9 cm)  BEHAVIORAL SYMPTOMS/MOOD NEUROLOGICAL BOWEL NUTRITION STATUS  Other (Comment) (Patient has been verbally abusive.)  (NONE) Continent Diet (Carb Modified.)  AMBULATORY STATUS COMMUNICATION OF NEEDS Skin   Limited Assist (Needs assistance with transfers to wheel chair.) Verbally Normal                       Personal Care Assistance Level of Assistance  Bathing, Dressing, Feeding Bathing Assistance: Limited assistance Feeding assistance: Independent Dressing Assistance: Limited assistance     Functional Limitations Info  Sight, Hearing, Speech Sight Info: Adequate Hearing Info: Adequate Speech Info: Adequate    SPECIAL CARE FACTORS FREQUENCY                        Contractures Contractures Info: Not present    Additional Factors Info  Code Status, Allergies Code Status Info: Full Allergies Info: Not on file           Current Medications (09/24/2015):  This is the current hospital active medication list Current Facility-Administered Medications  Medication Dose Route Frequency Provider Last Rate Last Dose  . aspirin EC tablet 81 mg  81 mg Oral Daily Virginia Crews, MD   81 mg at 09/23/15 1159  . atorvastatin (LIPITOR) tablet 40 mg  40 mg Oral q1800 Virginia Crews, MD   40 mg at 09/23/15 2114  . diltiazem (CARDIZEM SR) 12 hr capsule 90 mg  90 mg Oral Q12H Virginia Crews, MD   90 mg at 09/23/15 2114  . divalproex (DEPAKOTE) DR tablet 500 mg  500 mg Oral Q12H Virginia Crews, MD   500 mg at 09/23/15 2114  . enoxaparin (LOVENOX) injection 90 mg  1 mg/kg Subcutaneous Q12H Rebecka Apley, RPH   90 mg at [id number]  . folic acid (FOLVITE) tablet 1 mg  1 mg Oral Daily Virginia Crews, MD   1 mg at 09/23/15 1159  . LORazepam (ATIVAN) tablet 1 mg  1 mg Oral Q6H PRN Virginia Crews, MD       Or  . LORazepam (ATIVAN) injection 1 mg  1 mg Intravenous Q6H PRN Virginia Crews, MD   1 mg at  09/24/15 0858  . multivitamin with minerals tablet 1 tablet  1 tablet Oral Daily Virginia Crews, MD   1 tablet at 09/23/15 1158  . sodium chloride flush (NS) 0.9 % injection 3 mL  3 mL Intravenous Q12H Virginia Crews, MD   3 mL at 09/23/15 1000  . thiamine (VITAMIN B-1) tablet 100 mg  100 mg Oral Daily Virginia Crews, MD   100 mg at 09/23/15 1203   Or  . thiamine (B-1) injection 100 mg  100 mg Intravenous Daily Virginia Crews, MD         Discharge Medications: Please see discharge summary for a list of discharge medications.  Relevant Imaging Results:  Relevant Lab Results:   Additional Information SSN: [id number]  Rigoberto Noel, LCSW

## 2015-09-24 NOTE — Progress Notes (Signed)
Family Medicine Teaching Service Daily Progress Note Intern Pager: [phone]  Patient name: Steven [name] Medical record number: [id number] Date of birth: 11-14-1960 Age: 55 y.o. Gender: male  Primary Care Provider: Chrisandra Netters, MD Consultants:  Psychiatry Code Status: FULL - discussed with patient after MS improved  Pt Overview and Major Events to Date:  4/11 - admitted to [facility]; vanc/zosyn started 4/13 - vanc/zosyn d/ced  Assessment and Plan: Steven [name] is a 55 y.o. male presenting with altered mental status . 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  # Altered Mental Status: Head CT negative. Differential sepsis, hypoglycemia (CBG 12 prior to arrival to ED), seizure (nl EEG but valproic acid level <10), substance (UDS +cocaine and benzo) & EtOH (level <10). VBG 7.25/60/34/26. Ammonia normal. CIWA scores 4, 1 overnight.  - CIWA protocol - Ativan PRN agitation - Home Depakote  - Fall precautions - Psych consulted - will evaluate for capacity on 4/17  # Concern for Sepsis, unclear source: resolved. 4/4 SIRS (hypothermia to 94.8, tachycardia (per report), hypotensive to 98/65 and leucocytosis to 12.1) on presentation. Lactic acid 4.6, resolved after fluid. UA only with protienuria, clear CXR, and no skin rashes or open wounds, and no meningeal signs. Remains afebrile. - F/u BCx - NGx3d - F/u CBC  # Hypoglycemia, T2DM: Hypoglycemia resolved. CBG 12 - given 1amp of D50 prior to arrival to ED and another 4 amps of D50 given and initiated D5. History of AMS 2/2 hypoglycemia in the past. Last A1C 9.9 on 08/03/15. No clear records of insulin dosing at home, also on Metformin.  CBGs 211, 268 overnight.  - Monitor CBGs q4h - SSI thin  # Hypokalemia: mild, K 3.4 > 4 after KDur. K 3.9 this AM.  - Continue to monitor  # HTN: slightly  hypertensive. Prescribed Lisinopril 20mg  and HCTZ 12.5 mg daily. BP 159/80 overnight.  - Monitor closely and resume antihypertensives as indicated  # H/o multiple PEs in 07/2015: Prescribed lovenox at home. - Lovenox 90 mg q12h  # Atrial Fibrillation:  - Diltiazem 90 mg twice a day, Lovenox and Aspirin 81mg  - Avoid beta blockers in setting of cocaine use  # History of Alcohol Use/Illicit Drug Use: UDS positive for cocaine and benzos. CIWA scores overnight 4, 1. - CIWA protocol - Thiamine, folate, and MVI daily  FEN/GI:  Heart healthy carb mod diet Prophylaxis: Lovenox  Disposition: pending medical improvement   Subjective:  Patient very agitated yesterday afternoon. He actually lowered himself out of bed at one point and pulled himself out into the hallway while sitting on a bedsheet. Patient then became combative, and required IV Ativan followed by Haldol for continued agitation/combativeness. Patient rested peacefully overnight.  Patient with no complaints this AM, however would only repeat back everything I said. More alert than on prior exams, however unsure of mental status given refusal to answer orientation questions.   Objective: Temp:  [97.8 F (36.6 C)-98 F (36.7 C)] 98 F (36.7 C) (04/15 0401) Pulse Rate:  [76-80] 76 (04/15 0401) Resp:  [18] 18 (04/15 0401) BP: (158-159)/(80) 158/80 mmHg (04/15 0401) SpO2:  [99 %-100 %] 100 % (04/15 0401) Physical Exam: General: alert, sitting up in bed in NAD Cardiovascular: RRR, no murmurs appreciated Respiratory: CTAB, no wheezes or rhonchi Abdomen: soft, non-tender, non-distended, +BS Extremities: R AKA and L BKA, well-healing stumps, no edema or erythema Neuro:  Unable to assess as patient not answering questions and only repeating back my questions; no focal deficits; awake and alert  Laboratory:  Recent Labs Lab 09/22/15 0314 09/23/15 0923 09/25/15 0312  WBC 12.1* 5.0 5.1  HGB 10.8* 12.6* 12.3*  HCT 33.6* 37.9* 38.2*   PLT 173 177 206    Recent Labs Lab 09/21/15 1607  09/23/15 0923 09/24/15 0317 09/25/15 0312  NA 140  < > 139 137 137  K 3.4*  < > 4.4 4.9 3.9  CL 104  < > 107 107 104  CO2 24  < > 21* 20* 24  BUN 16  < > 6 10 9   CREATININE 1.08  < > 1.04 1.04 0.95  CALCIUM 9.3  < > 9.3 9.4 9.8  PROT 6.9  --   --   --   --   BILITOT 0.6  --   --   --   --   ALKPHOS 52  --   --   --   --   ALT 16*  --   --   --   --   AST 19  --   --   --   --   GLUCOSE 97  < > 217* 212* 243*  < > = values in this interval not displayed. Imaging/Diagnostic Tests: Ct Head Wo Contrast 09/21/2015  CLINICAL DATA:  Patient found unresponsive on floor at home. Altered mental status. Initial encounter. EXAM: CT HEAD WITHOUT CONTRAST. IMPRESSION: 1. No evidence of traumatic intracranial injury or fracture. 2. Mild small vessel ischemic microangiopathy.   Dg Chest Portable 1 View 09/21/2015  CLINICAL DATA:  Altered mental status EXAM: PORTABLE CHEST 1 VIEW. IMPRESSION: Mild vascular congestion. Electronically Signed   By: Inez Catalina M.D.   On: 09/21/2015 16:44    [place], MD 09/25/2015, 7:21 AM PGY-1, [place] Intern pager: [phone], text pages welcome

## 2015-09-24 NOTE — Consult Note (Signed)
[facility] Psychiatry Consult   Reason for Consult:  Altered mental status and capacity evaluation Referring Physician:  Dr. Erin Hearing Patient Identification: Steven [name] MRN:  [id number] Principal Diagnosis: <principal problem not specified> Diagnosis:   Patient Active Problem List   Diagnosis Date Noted  . Lactic acidosis [E87.2]   . Hypoglycemia [E16.2] 09/21/2015  . Altered mental status [R41.82] 09/21/2015  . Severe sepsis ([place]) [A41.9, R65.20] 09/21/2015  . HTN (hypertension) [I10] 09/21/2015  . T2DM (type 2 diabetes mellitus) ([place]) [E11.9] 09/21/2015  . Atrial fibrillation ([place]) [I48.91] 09/21/2015  . Pulmonary embolism ([place]) [I26.99] 09/21/2015  . Polysubstance abuse [F19.10] 09/21/2015  . Seizures ([place]) [R56.9] 09/21/2015  . Hypokalemia [E87.6] 09/21/2015    Total Time spent with patient: 45 minutes  Subjective:   Steven [name] is a 55 y.o. male patient admitted with altered mental status  HPI:  Steven [name] is a 55 y.o. Male seen, chart reviewed and case discussed with the staff nurse for face-to-face psychiatric consultation and evaluation of the posterior evaluation. Patient appeared sitting in his bed without cloths and covering with the bed sheet. Patient has been with altered mental status, poor historian responded briefly with his first name and last name and then started mumbling for the rest of the questions. Reportedly patient suffering with substance abuse, urine drug screen is positive for cocaine and benzodiazepine. Staff RN reported patient was walk away from the previous skilled nursing facility and been staying by himself. Patient has a sister who supervise him from time to time.  Medical history: Patient PMH is significant for T2DM, HTN, atrial fibrillation, polysubstance abuse (cocaine, tobacco, alcohol), h/o PE (07/2015), PVD, AKA on right, BKA on left, ?seizures in setting of hypoglycemia  Past Psychiatric History: History of polysubstance  abuse.  Risk to Self:   Risk to Others:   Prior Inpatient Therapy:   Prior Outpatient Therapy:    Past Medical History: No past medical history on file. No past surgical history on file. Family History: No family history on file. Family Psychiatric  History: unknown  Social History:  History  Alcohol Use: Not on file     History  Drug Use Not on file    Social History   Social History  . Marital Status: Single    Spouse Name: N/A  . Number of Children: N/A  . Years of Education: N/A   Social History Main Topics  . Smoking status: Not on file  . Smokeless tobacco: Not on file  . Alcohol Use: Not on file  . Drug Use: Not on file  . Sexual Activity: Not on file   Other Topics Concern  . Not on file   Social History Narrative  . No narrative on file   Additional Social History:    Allergies:  Not on File  Labs:  Results for orders placed or performed during the hospital encounter of 09/21/15 (from the past 48 hour(s))  Glucose, capillary     Status: Abnormal   Collection Time: 09/22/15 11:54 AM  Result Value Ref Range   Glucose-Capillary 203 (H) 65 - 99 mg/dL  Glucose, capillary     Status: Abnormal   Collection Time: 09/22/15  3:29 PM  Result Value Ref Range   Glucose-Capillary 216 (H) 65 - 99 mg/dL  Glucose, capillary     Status: Abnormal   Collection Time: 09/22/15  7:46 PM  Result Value Ref Range   Glucose-Capillary 235 (H) 65 - 99 mg/dL  Glucose, capillary  Status: Abnormal   Collection Time: 09/22/15 11:38 PM  Result Value Ref Range   Glucose-Capillary 198 (H) 65 - 99 mg/dL  Glucose, capillary     Status: Abnormal   Collection Time: 09/23/15  3:46 AM  Result Value Ref Range   Glucose-Capillary 183 (H) 65 - 99 mg/dL  Glucose, capillary     Status: Abnormal   Collection Time: 09/23/15  8:09 AM  Result Value Ref Range   Glucose-Capillary 229 (H) 65 - 99 mg/dL  Basic metabolic panel     Status: Abnormal   Collection Time: 09/23/15  9:23 AM   Result Value Ref Range   Sodium 139 135 - 145 mmol/L   Potassium 4.4 3.5 - 5.1 mmol/L   Chloride 107 101 - 111 mmol/L   CO2 21 (L) 22 - 32 mmol/L   Glucose, Bld 217 (H) 65 - 99 mg/dL   BUN 6 6 - 20 mg/dL   Creatinine, Ser 1.04 0.61 - 1.24 mg/dL   Calcium 9.3 8.9 - 10.3 mg/dL   GFR calc non Af Amer >60 >60 mL/min   GFR calc Af Amer >60 >60 mL/min    Comment: (NOTE) The eGFR has been calculated using the CKD EPI equation. This calculation has not been validated in all clinical situations. eGFR's persistently <60 mL/min signify possible Chronic Kidney Disease.    Anion gap 11 5 - 15  CBC     Status: Abnormal   Collection Time: 09/23/15  9:23 AM  Result Value Ref Range   WBC 5.0 4.0 - 10.5 K/uL   RBC 4.62 4.22 - 5.81 MIL/uL   Hemoglobin 12.6 (L) 13.0 - 17.0 g/dL   HCT 37.9 (L) 39.0 - 52.0 %   MCV 82.0 78.0 - 100.0 fL   MCH 27.3 26.0 - 34.0 pg   MCHC 33.2 30.0 - 36.0 g/dL   RDW 15.3 11.5 - 15.5 %   Platelets 177 150 - 400 K/uL  Glucose, capillary     Status: Abnormal   Collection Time: 09/23/15 11:54 AM  Result Value Ref Range   Glucose-Capillary 221 (H) 65 - 99 mg/dL  Glucose, capillary     Status: Abnormal   Collection Time: 09/23/15  4:13 PM  Result Value Ref Range   Glucose-Capillary 279 (H) 65 - 99 mg/dL  Glucose, capillary     Status: Abnormal   Collection Time: 09/23/15  8:26 PM  Result Value Ref Range   Glucose-Capillary 208 (H) 65 - 99 mg/dL  Glucose, capillary     Status: Abnormal   Collection Time: 09/23/15 11:44 PM  Result Value Ref Range   Glucose-Capillary 203 (H) 65 - 99 mg/dL  Basic metabolic panel     Status: Abnormal   Collection Time: 09/24/15  3:17 AM  Result Value Ref Range   Sodium 137 135 - 145 mmol/L   Potassium 4.9 3.5 - 5.1 mmol/L   Chloride 107 101 - 111 mmol/L   CO2 20 (L) 22 - 32 mmol/L   Glucose, Bld 212 (H) 65 - 99 mg/dL   BUN 10 6 - 20 mg/dL   Creatinine, Ser 1.04 0.61 - 1.24 mg/dL   Calcium 9.4 8.9 - 10.3 mg/dL   GFR calc non Af  Amer >60 >60 mL/min   GFR calc Af Amer >60 >60 mL/min    Comment: (NOTE) The eGFR has been calculated using the CKD EPI equation. This calculation has not been validated in all clinical situations. eGFR's persistently <60 mL/min signify possible Chronic Kidney  Disease.    Anion gap 10 5 - 15  Glucose, capillary     Status: Abnormal   Collection Time: 09/24/15  3:58 AM  Result Value Ref Range   Glucose-Capillary 195 (H) 65 - 99 mg/dL    Current Facility-Administered Medications  Medication Dose Route Frequency Provider Last Rate Last Dose  . 0.45 % sodium chloride infusion   Intravenous Continuous Virginia Crews, MD 125 mL/hr at 09/24/15 0458    . aspirin EC tablet 81 mg  81 mg Oral Daily Virginia Crews, MD   81 mg at 09/23/15 1159  . atorvastatin (LIPITOR) tablet 40 mg  40 mg Oral q1800 Virginia Crews, MD   40 mg at 09/23/15 2114  . diltiazem (CARDIZEM SR) 12 hr capsule 90 mg  90 mg Oral Q12H Virginia Crews, MD   90 mg at 09/23/15 2114  . divalproex (DEPAKOTE) DR tablet 500 mg  500 mg Oral Q12H Virginia Crews, MD   500 mg at 09/23/15 2114  . enoxaparin (LOVENOX) injection 90 mg  1 mg/kg Subcutaneous Q12H Rebecka Apley, RPH   90 mg at 37/85/88 5027  . folic acid (FOLVITE) tablet 1 mg  1 mg Oral Daily Virginia Crews, MD   1 mg at 09/23/15 1159  . LORazepam (ATIVAN) tablet 1 mg  1 mg Oral Q6H PRN Virginia Crews, MD       Or  . LORazepam (ATIVAN) injection 1 mg  1 mg Intravenous Q6H PRN Virginia Crews, MD   1 mg at 09/24/15 0858  . multivitamin with minerals tablet 1 tablet  1 tablet Oral Daily Virginia Crews, MD   1 tablet at 09/23/15 1158  . sodium chloride flush (NS) 0.9 % injection 3 mL  3 mL Intravenous Q12H Virginia Crews, MD   3 mL at 09/23/15 1000  . thiamine (VITAMIN B-1) tablet 100 mg  100 mg Oral Daily Virginia Crews, MD   100 mg at 09/23/15 1203   Or  . thiamine (B-1) injection 100 mg  100 mg Intravenous Daily Virginia Crews, MD        Musculoskeletal: Strength & Muscle Tone: decreased Gait & Station: BKA and AMS Patient leans: N/A  Psychiatric Specialty Exam: Review of Systems  Unable to perform ROS   Blood pressure 139/83, pulse 74, temperature 97.7 F (36.5 C), temperature source Oral, resp. rate 18, height 6' (1.829 m), weight 91.309 kg (201 lb 4.8 oz), SpO2 99 %.Body mass index is 27.3 kg/(m^2).  General Appearance: Disheveled and Guarded  Eye Contact::  Minimal  Speech:  Blocked, Slow and Slurred  Volume:  Decreased  Mood:  Depressed  Affect:  Flat  Thought Process:  Disorganized and Not coherent  Orientation:  Negative  Thought Content:  NA  Suicidal Thoughts:  No  Homicidal Thoughts:  No  Memory:  NA  Judgement:  Impaired  Insight:  NA  Psychomotor Activity:  Psychomotor Retardation  Concentration:  Poor  Recall:  Poor  Fund of Knowledge:Poor  Language: Poor  Akathisia:  NA  Handed:  Right  AIMS (if indicated):     Assets:  Others:  Deferred  ADL's:  Impaired  Cognition: Impaired,  Severe  Sleep:      Treatment Plan Summary: Patient does not meet criteria for capacity to make his own medical decisions or living arrangements. Referred to the knee in the social service to contact patient's sister regarding guardianship or medical care power  of attorney. Appreciate psychiatric consultation and we sign off as of today Please contact [phone] or [phone] if needs further assistance   Disposition: Patient benefit from skilled nursing facility when medically stable Supportive therapy provided about ongoing stressors.  Durward Parcel., MD 09/24/2015 9:33 AM

## 2015-09-24 NOTE — Evaluation (Signed)
Physical Therapy Evaluation Patient Details Name: Steven [name] MRN: [id number] DOB: [birth year] Today's Date: 09/24/2015   History of Present Illness  Pt admitted with AMS.  PMH: DM, HTN, afib, polysubstance abuse, PVD, R AKA, L BKA.  Clinical Impression  Pt cooperative with PT/OT coeval because he wanted to get OOB.  Pt happy with being in wheelchair and rolling around the floor.  Pt needs 24/7 assist but no skilled needs identified.  Pt does well (mobility wise) doing what he wants to do.  Pt did scoot transfer bed to chair and back to bed with min guard only.    Follow Up Recommendations SNF;Supervision/Assistance - 24 hour    Equipment Recommendations       Recommendations for Other Services       Precautions / Restrictions Precautions Precautions: Fall      Mobility  Bed Mobility Overal bed mobility: Needs Assistance Bed Mobility: Supine to Sit     Supine to sit: Supervision     General bed mobility comments: pt able to perform supine to long sitting without physical assist  Transfers Overall transfer level: Needs assistance   Transfers: Lateral/Scoot Transfers          Lateral/Scoot Transfers: Min guard General transfer comment: multimodal cues to for brakes, min guard for safety and to manage clothing.  pt reports he uses sliding board at home.  pt able to lift body fully to scoot over  Ambulation/Gait                Hotel manager mobility: Yes Wheelchair propulsion: Both upper extremities Wheelchair Assistance Details (indicate cue type and reason): pt propelled wheelchair up and down halls (did 150 feet) - arms fatiqued every 30 feet.  pt needed max cues for brakes and to start propelling.  pt unable to figure out how to turn and maneuver around objects.  Pt was slow to turn and needed cues but was inconsistent with good techqneu to do this.  Pt needed max cues to stay on task.  pt enjoyed  being up in chair  Modified Rankin (Stroke Patients Only)       Balance Overall balance assessment: Needs assistance   Sitting balance-Leahy Scale: Good                                       Pertinent Vitals/Pain Pain Assessment: Faces Faces Pain Scale: No hurt    Home Living Family/patient expects to be discharged to:: Skilled nursing facility                      Prior Function Level of Independence: Independent with assistive device(s)         Comments: used a manual w/c and performed lateral transfers independently     Hand Dominance        Extremity/Trunk Assessment   Upper Extremity Assessment: Overall WFL for tasks assessed           Lower Extremity Assessment: Overall WFL for tasks assessed (residual limbs well healed and left BKA with full knee extension)      Cervical / Trunk Assessment: Normal  Communication   Communication: No difficulties  Cognition Arousal/Alertness: Awake/alert Behavior During Therapy: Flat affect Overall Cognitive Status: Impaired/Different from baseline Area of Impairment: Orientation;Attention;Memory;Safety/judgement;Problem solving;Following commands (pt never knew he  was in the hospital after several times told) Orientation Level: Disoriented to;Place;Time;Situation Current Attention Level: Focused Memory: Decreased short-term memory;Decreased recall of precautions Following Commands: Follows one step commands inconsistently Safety/Judgement: Decreased awareness of safety;Decreased awareness of deficits Awareness: Intellectual Problem Solving: Slow processing;Decreased initiation;Difficulty sequencing;Requires verbal cues;Requires tactile cues      General Comments General comments (skin integrity, edema, etc.): pt with good sitting balance.  residual limbs well healed    Exercises        Assessment/Plan    PT Assessment Patent does not need any further PT services  PT Diagnosis  Generalized weakness;Altered mental status   PT Problem List    PT Treatment Interventions     PT Goals (Current goals can be found in the Care Plan section) Acute Rehab PT Goals Patient Stated Goal: did not state    Frequency     Barriers to discharge        Co-evaluation PT/OT/SLP Co-Evaluation/Treatment: Yes Reason for Co-Treatment: Necessary to address cognition/behavior during functional activity PT goals addressed during session: Mobility/safety with mobility OT goals addressed during session: ADL's and self-care       End of Session   Activity Tolerance: Patient tolerated treatment well Patient left: in bed;with call bell/phone within reach;with bed alarm set Nurse Communication: Mobility status         Time: [id number] PT Time Calculation (min) (ACUTE ONLY): 30 min   Charges:   PT Evaluation $PT Eval Moderate Complexity: 1 Procedure     PT G Codes:        Loyal Buba 09/24/2015, 2:38 PM 09/24/2015   Rande Lawman, PT

## 2015-09-24 NOTE — Clinical Social Work Placement (Signed)
   CLINICAL SOCIAL WORK PLACEMENT  NOTE  Date:  09/24/2015  Patient Details  Name: Steven [name] MRN: [id number] Date of Birth: [birth year]  Clinical Social Work is seeking post-discharge placement for this patient at the [place] level of care (*CSW will initial, date and re-position this form in  chart as items are completed):  Yes   Patient/family provided with [facility] Work Department's list of facilities offering this level of care within the geographic area requested by the patient (or if unable, by the patient's family).  Yes   Patient/family informed of their freedom to choose among providers that offer the needed level of care, that participate in Medicare, Medicaid or managed care program needed by the patient, have an available bed and are willing to accept the patient.  Yes   Patient/family informed of [facility]'s ownership interest in [facility] and [facility], as well as of the fact that they are under no obligation to receive care at these facilities.  PASRR submitted to EDS on       PASRR number received on       Existing PASRR number confirmed on 09/24/15     FL2 transmitted to all facilities in geographic area requested by pt/family on 09/24/15     FL2 transmitted to all facilities within larger geographic area on       Patient informed that his/her managed care company has contracts with or will negotiate with certain facilities, including the following:            Patient/family informed of bed offers received.  Patient chooses bed at       Physician recommends and patient chooses bed at      Patient to be transferred to   on  .  Patient to be transferred to facility by       Patient family notified on   of transfer.  Name of family member notified:        PHYSICIAN Please prepare priority discharge summary, including medications, Please prepare prescriptions, Please sign FL2     Additional Comment:      _______________________________________________ Rigoberto Noel, LCSW 09/24/2015, 3:23 PM

## 2015-09-24 NOTE — Progress Notes (Signed)
Pt became agitated when the nurse said that the pt was not safe to use the bedside commode. Pt then grabbed the commode and tried to take it away from the nurse. When the nurse got the commode away, pt then lowered himself onto the fall mats, and continued to scoot on his rear down the hallway from room 15 to room 5. At this point security came up to place the pt in a wheelchair and escort him back to his room. During this process, pt grabbed a security guard by the ankle. When security finally restrained him, pt was placed into his bed, IV ativan given. And MD notified. Orders noted. When security left the room, pt calm in the bed. Bed alarm on. Call bell within reach. Will continue to monitor the pt.

## 2015-09-24 NOTE — Progress Notes (Signed)
Inpatient Diabetes Program Recommendations  AACE/ADA: New Consensus Statement on Inpatient Glycemic Control (2015)  Target Ranges:  Prepandial:   less than 140 mg/dL      Peak postprandial:   less than 180 mg/dL (1-2 hours)      Critically ill patients:  140 - 180 mg/dL   Review of Glycemic Control  Results for Steven [name], Steven [name] (MRN [id number]) as of 09/24/2015 14:17  Ref. Range 09/23/2015 16:13 09/23/2015 20:26 09/23/2015 23:44 09/24/2015 03:58 09/24/2015 12:49  Glucose-Capillary Latest Ref Range: 65-99 mg/dL 279 (H) 208 (H) 203 (H) 195 (H) 276 (H)   Diabetes history: yes, noted in HX Outpatient Diabetes medications: none noted in med rec Current orders for Inpatient glycemic control: none  Inpatient Diabetes Program Recommendations:   Correction (SSI): add Novolog moderate scale per Glycemic Control order-set HgbA1C: order to assess prehospital glucose control Thank you  Raoul Pitch BSN, RN,CDE Inpatient Diabetes Coordinator [phone] (team pager)

## 2015-09-24 NOTE — Clinical Social Work Note (Signed)
Clinical Social Work Assessment  Patient Details  Name: Steven [name] MRN: [id number] Date of Birth: 1961-04-12  Date of referral:  09/24/15               Reason for consult:  Facility Placement, Discharge Planning                Permission sought to share information with:    Permission granted to share information::  No (Patient is unable to give consent as he lacks capacity. CSW spoke with sister Steven [name].)  Name::        Agency::     Relationship::     Contact Information:     Housing/Transportation Living arrangements for the past 2 months:  [place], [place] of Information:  Other (Comment Required) (Sister Steven [name] and Steven [name] with [facility]) Patient Interpreter Needed:  None Criminal Activity/Legal Involvement Pertinent to Current Situation/Hospitalization:  No - Comment as needed Significant Relationships:  Siblings Lives with:  Self Do you feel safe going back to the place where you live?  No Need for family participation in patient care:  Yes (Comment)  Care giving concerns:  Patient's sister list many concerns. She agrees that the patient cannot be at home alone but is apprehensive about placement due to negative experiences in the past.   Facilities manager / plan:  CSW spoke with patient's sister Steven [name] by phone. CSW explained that the patient has been recommended for SNF placement at discharge. Steven [name] states that she is unable to provide much assistance with discharge planning because she has recently had a knee replacement and cannot leave her home. CSW explained that her assistance will be needed to sign the patient in and at least provide verbal consents to an accepting facility. Steven [name] states that she would be agreeable to providing verbal consents for the patient's treatment. The sister states that the patient was recently at [facility]. The patient left the facility AMA. The patient has been evaluated by psych and it  has been determined that he does not have capacity. CSW explained to the sister that the patient's SNF options will be limited. After speaking with Steven [name] from [facility], it appears the patient's Medicaid has termed which will also make placement difficult. CSW explained SNF search/placement process to sister and answered the sister's questions. CSW will followup with any available bed offers.   Employment status:  Disabled (Comment on whether or not currently receiving Disability) Insurance information:  Medicaid In [place] PT Recommendations:  [place] / Referral to community resources:  [place] (Referral for long term SNF placement has been made.)  Patient/Family's Response to care:  The patient's family appears appreciative of the care the patient is receiving.  Patient/Family's Understanding of and Emotional Response to Diagnosis, Current Treatment, and Prognosis:  The patient's sister is worried about the patient as she would like for him to stay in a facility long term but historically the patient leaves facilities AMA. Sister appears to have fair understanding of the reason for patient's admission and a good understanding of the patient's post DC needs.   Emotional Assessment Appearance:  Appears stated age Attitude/Demeanor/Rapport:  Unable to Assess Affect (typically observed):  Unable to Assess Orientation:  Oriented to Self Alcohol / Substance use:  Illicit Drugs Psych involvement (Current and /or in the community):  Yes (Comment) (Patient lacks capacity.)  Discharge Needs  Concerns to be addressed:  Discharge Planning Concerns Readmission within  the last 30 days:  No Current discharge risk:  Chronically ill, Cognitively Impaired, Physical Impairment, Substance Abuse Barriers to Discharge:  Active Substance Use, Other (Patient will be difficult to place due to his behaviors, substance abuse history, and  termed Medicaid.)   Liz Beach MSW, [place], [place], [id number]

## 2015-09-25 LAB — CBC
HCT: 38.2 % — ABNORMAL LOW (ref 39.0–52.0)
Hemoglobin: 12.3 g/dL — ABNORMAL LOW (ref 13.0–17.0)
MCH: 26.2 pg (ref 26.0–34.0)
MCHC: 32.2 g/dL (ref 30.0–36.0)
MCV: 81.4 fL (ref 78.0–100.0)
PLATELETS: 206 10*3/uL (ref 150–400)
RBC: 4.69 MIL/uL (ref 4.22–5.81)
RDW: 14.8 % (ref 11.5–15.5)
WBC: 5.1 10*3/uL (ref 4.0–10.5)

## 2015-09-25 LAB — GLUCOSE, CAPILLARY
Glucose-Capillary: 204 mg/dL — ABNORMAL HIGH (ref 65–99)
Glucose-Capillary: 211 mg/dL — ABNORMAL HIGH (ref 65–99)
Glucose-Capillary: 264 mg/dL — ABNORMAL HIGH (ref 65–99)
Glucose-Capillary: 268 mg/dL — ABNORMAL HIGH (ref 65–99)

## 2015-09-25 LAB — BASIC METABOLIC PANEL
Anion gap: 9 (ref 5–15)
BUN: 9 mg/dL (ref 6–20)
CO2: 24 mmol/L (ref 22–32)
Calcium: 9.8 mg/dL (ref 8.9–10.3)
Chloride: 104 mmol/L (ref 101–111)
Creatinine, Ser: 0.95 mg/dL (ref 0.61–1.24)
GFR calc Af Amer: >60 mL/min (ref >60)
GLUCOSE: 243 mg/dL — AB (ref 65–99)
POTASSIUM: 3.9 mmol/L (ref 3.5–5.1)
Sodium: 137 mmol/L (ref 135–145)

## 2015-09-25 MED ORDER — HALOPERIDOL 5 MG PO TABS
5.0000 mg | ORAL_TABLET | Freq: Three times a day (TID) | ORAL | Status: AC | PRN
  Administered 2015-09-26: 5 mg via ORAL
  Filled 2015-09-25 (×2): qty 1

## 2015-09-25 MED ORDER — ONDANSETRON HCL 4 MG PO TABS: 4.0000 mg | ORAL_TABLET | Freq: Three times a day (TID) | ORAL | Status: DC | PRN

## 2015-09-25 MED ORDER — HALOPERIDOL LACTATE 5 MG/ML IJ SOLN: 5.0000 mg | Freq: Three times a day (TID) | INTRAMUSCULAR | Status: AC | PRN

## 2015-09-25 MED ORDER — HALOPERIDOL LACTATE 5 MG/ML IJ SOLN: 5.0000 mg | Freq: Once | INTRAMUSCULAR | Status: AC | PRN

## 2015-09-25 MED ORDER — INSULIN GLARGINE 100 UNIT/ML ~~LOC~~ SOLN
25.0000 [IU] | Freq: Every day | SUBCUTANEOUS | Status: DC
  Administered 2015-09-25 – 2015-09-27 (×3): 25 [IU] via SUBCUTANEOUS
  Filled 2015-09-25 (×4): qty 0.25

## 2015-09-25 MED ORDER — HALOPERIDOL 5 MG PO TABS
5.0000 mg | ORAL_TABLET | Freq: Once | ORAL | Status: AC | PRN
  Administered 2015-09-25: 5 mg via ORAL
  Filled 2015-09-25: qty 1

## 2015-09-26 LAB — BASIC METABOLIC PANEL
Anion gap: 10 (ref 5–15)
BUN: 9 mg/dL (ref 6–20)
CO2: 25 mmol/L (ref 22–32)
CREATININE: 1.01 mg/dL (ref 0.61–1.24)
Calcium: 9.8 mg/dL (ref 8.9–10.3)
Chloride: 102 mmol/L (ref 101–111)
GFR calc Af Amer: >60 mL/min (ref >60)
GLUCOSE: 184 mg/dL — AB (ref 65–99)
POTASSIUM: 3.6 mmol/L (ref 3.5–5.1)
SODIUM: 137 mmol/L (ref 135–145)

## 2015-09-26 LAB — CULTURE, BLOOD (ROUTINE X 2)
CULTURE: NO GROWTH
Culture: NO GROWTH

## 2015-09-26 LAB — GLUCOSE, CAPILLARY
GLUCOSE-CAPILLARY: 144 mg/dL — AB (ref 65–99)
GLUCOSE-CAPILLARY: 216 mg/dL — AB (ref 65–99)
GLUCOSE-CAPILLARY: 244 mg/dL — AB (ref 65–99)
GLUCOSE-CAPILLARY: 93 mg/dL (ref 65–99)
Glucose-Capillary: 265 mg/dL — ABNORMAL HIGH (ref 65–99)
Glucose-Capillary: 100 mg/dL — ABNORMAL HIGH (ref 65–99)
Glucose-Capillary: 231 mg/dL — ABNORMAL HIGH (ref 65–99)

## 2015-09-26 MED ORDER — HALOPERIDOL LACTATE 5 MG/ML IJ SOLN: 5.0000 mg | Freq: Four times a day (QID) | INTRAMUSCULAR | Status: DC | PRN

## 2015-09-26 MED ORDER — PNEUMOCOCCAL VAC POLYVALENT 25 MCG/0.5ML IJ INJ
0.5000 mL | INJECTION | INTRAMUSCULAR | Status: AC
  Administered 2015-09-27: 0.5 mL via INTRAMUSCULAR

## 2015-09-26 MED ORDER — HALOPERIDOL 5 MG PO TABS
5.0000 mg | ORAL_TABLET | Freq: Four times a day (QID) | ORAL | Status: DC | PRN
  Administered 2015-09-27: 5 mg via ORAL
  Filled 2015-09-26 (×2): qty 1

## 2015-09-26 NOTE — Progress Notes (Signed)
Family Medicine Teaching Service Daily Progress Note Intern Pager: [phone]  Patient name: Steven [name] Medical record number: [id number] Date of birth: 04/28/1961 Age: 55 y.o. Gender: male  Primary Care Provider: Chrisandra Netters, MD Consultants:  Psychiatry Code Status: FULL - discussed with patient after MS improved  Pt Overview and Major Events to Date:  4/11 - admitted to [facility]; vanc/zosyn started 4/13 - vanc/zosyn d/ced  Assessment and Plan: Steven [name] is a 55 y.o. male presenting with altered mental status . 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  # Altered Mental Status: Head CT negative. Differential sepsis, hypoglycemia (CBG 12 prior to arrival to ED), seizure (nl EEG but valproic acid level <10), substance (UDS +cocaine and benzo) & EtOH (level <10). VBG 7.25/60/34/26. Ammonia normal. CIWA scores 4, 2 overnight.  - CIWA protocol - Ativan PRN agitation - Home Depakote  - Fall precautions - Psych consulted - will evaluate for capacity on 4/17  # Concern for Sepsis, unclear source: resolved. 4/4 SIRS (hypothermia to 94.8, tachycardia (per report), hypotensive to 98/65 and leucocytosis to 12.1) on presentation. Lactic acid 4.6, resolved after fluid. UA only with protienuria, clear CXR, and no skin rashes or open wounds, and no meningeal signs. Remains afebrile. - F/u BCx - NGx4d - F/u CBC  # Hypoglycemia, T2DM: Hypoglycemia resolved. CBG 12 - given 1amp of D50 prior to arrival to ED and another 4 amps of D50 given and initiated D5. History of AMS 2/2 hypoglycemia in the past. Last A1C 9.9 on 08/03/15. No clear records of insulin dosing at home, also on Metformin.  CBGs 155, 216 overnight.  - Monitor CBGs q4h - SSI thin  # Hypokalemia: mild, K 3.4 > 4 after KDur. K 3.6 this AM.  - Continue to monitor  # HTN: slightly  hypertensive. Prescribed Lisinopril 20mg  and HCTZ 12.5 mg daily. BP 165/88, 139/88 overnight.  - Monitor closely and resume antihypertensives as indicated  # H/o multiple PEs in 07/2015: Prescribed lovenox at home. - Lovenox 90 mg q12h  # Atrial Fibrillation:  - Diltiazem 90 mg twice a day, Lovenox and Aspirin 81mg  - Avoid beta blockers in setting of cocaine use  # History of Alcohol Use/Illicit Drug Use: UDS positive for cocaine and benzos. CIWA scores overnight 4, 2. - CIWA protocol - Thiamine, folate, and MVI daily  FEN/GI:  Heart healthy carb mod diet Prophylaxis: Lovenox  Disposition: pending medical improvement   Subjective:  Some agitation yesterday evening, but no acute events overnight. Patient with no complaints this AM.   Objective: Temp:  [98 F (36.7 C)-98.7 F (37.1 C)] 98 F (36.7 C) (04/16 0410) Pulse Rate:  [78-94] 78 (04/16 0410) Resp:  [18] 18 (04/16 0410) BP: (139-202)/(88-115) 139/88 mmHg (04/16 0410) SpO2:  [100 %] 100 % (04/16 0410) Weight:  [191 lb 6.4 oz (86.818 kg)] 191 lb 6.4 oz (86.818 kg) (04/16 0410) Physical Exam: General: alert, sitting up in bed in NAD Cardiovascular: RRR, no murmurs appreciated Respiratory: CTAB, no wheezes or rhonchi Abdomen: soft, non-tender, non-distended, +BS Extremities: R AKA and L BKA, well-healing stumps, no edema or erythema Neuro: alert to person and place; answers questions appropriately; no focal deficits  Laboratory:  Recent Labs Lab 09/22/15 0314 09/23/15 0923 09/25/15 0312  WBC 12.1* 5.0 5.1  HGB 10.8* 12.6* 12.3*  HCT 33.6* 37.9* 38.2*  PLT 173 177 206  Recent Labs Lab 09/21/15 1607  09/24/15 0317 09/25/15 0312 09/26/15 0340  NA 140  < > 137 137 137  K 3.4*  < > 4.9 3.9 3.6  CL 104  < > 107 104 102  CO2 24  < > 20* 24 25  BUN 16  < > 10 9 9   CREATININE 1.08  < > 1.04 0.95 1.01  CALCIUM 9.3  < > 9.4 9.8 9.8  PROT 6.9  --   --   --   --   BILITOT 0.6  --   --   --   --   ALKPHOS 52   --   --   --   --   ALT 16*  --   --   --   --   AST 19  --   --   --   --   GLUCOSE 97  < > 212* 243* 184*  < > = values in this interval not displayed. Imaging/Diagnostic Tests: Ct Head Wo Contrast 09/21/2015  CLINICAL DATA:  Patient found unresponsive on floor at home. Altered mental status. Initial encounter. EXAM: CT HEAD WITHOUT CONTRAST. IMPRESSION: 1. No evidence of traumatic intracranial injury or fracture. 2. Mild small vessel ischemic microangiopathy.   Dg Chest Portable 1 View 09/21/2015  CLINICAL DATA:  Altered mental status EXAM: PORTABLE CHEST 1 VIEW. IMPRESSION: Mild vascular congestion. Electronically Signed   By: Inez Catalina M.D.   On: 09/21/2015 16:44    [place], MD 09/26/2015, 7:18 AM PGY-1, [place] Intern pager: [phone], text pages welcome

## 2015-09-26 NOTE — Progress Notes (Signed)
Threw the bedside table across the room about 1045. Continues to state, "I am leaving here." Was given Haldol 5 mg PO for agitation.

## 2015-09-27 LAB — BASIC METABOLIC PANEL
Anion gap: 12 (ref 5–15)
BUN: 13 mg/dL (ref 6–20)
CALCIUM: 9.9 mg/dL (ref 8.9–10.3)
CO2: 22 mmol/L (ref 22–32)
CREATININE: 1.11 mg/dL (ref 0.61–1.24)
Chloride: 103 mmol/L (ref 101–111)
GFR calc Af Amer: >60 mL/min (ref >60)
GFR calc non Af Amer: >60 mL/min (ref >60)
GLUCOSE: 236 mg/dL — AB (ref 65–99)
Potassium: 4.5 mmol/L (ref 3.5–5.1)
Sodium: 137 mmol/L (ref 135–145)

## 2015-09-27 LAB — GLUCOSE, CAPILLARY
GLUCOSE-CAPILLARY: 158 mg/dL — AB (ref 65–99)
GLUCOSE-CAPILLARY: 193 mg/dL — AB (ref 65–99)
GLUCOSE-CAPILLARY: 249 mg/dL — AB (ref 65–99)
Glucose-Capillary: 166 mg/dL — ABNORMAL HIGH (ref 65–99)
Glucose-Capillary: 211 mg/dL — ABNORMAL HIGH (ref 65–99)

## 2015-09-27 LAB — CBC
HCT: 42.3 % (ref 39.0–52.0)
HEMOGLOBIN: 13.9 g/dL (ref 13.0–17.0)
MCH: 26.9 pg (ref 26.0–34.0)
MCHC: 32.9 g/dL (ref 30.0–36.0)
MCV: 81.8 fL (ref 78.0–100.0)
Platelets: 240 10*3/uL (ref 150–400)
RBC: 5.17 MIL/uL (ref 4.22–5.81)
RDW: 15.1 % (ref 11.5–15.5)
WBC: 5.3 10*3/uL (ref 4.0–10.5)

## 2015-09-27 LAB — VITAMIN B12: Vitamin B-12: 554 pg/mL (ref 180–914)

## 2015-09-27 LAB — FOLATE: Folate: 63 ng/mL (ref >5.9)

## 2015-09-27 NOTE — Progress Notes (Signed)
Family Medicine Teaching Service Daily Progress Note Intern Pager: [phone]  Patient name: Steven [name] Medical record number: [id number] Date of birth: 1960-10-18 Age: 55 y.o. Gender: male  Primary Care Provider: Chrisandra Netters, MD Consultants:  Psychiatry Code Status: FULL - discussed with patient after MS improved  Pt Overview and Major Events to Date:  4/11 - admitted to [facility]; vanc/zosyn started 4/13 - vanc/zosyn d/ced  Assessment and Plan: Steven [name] is a 55 y.o. male presenting with altered mental status . 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  # Altered Mental Status: Head CT negative. Differential sepsis, hypoglycemia (CBG 12 prior to arrival to ED), seizure (nl EEG but valproic acid level <10), substance (UDS +cocaine and benzo) & EtOH (level <10). VBG 7.25/60/34/26. Ammonia normal. HIV and RPR neg, folate and B12 WNL. CIWA scores 2, 2, 1 overnight. Continued daily agitation requiring Haldol. - CIWA protocol - Haldol 5mg  PRN - Home Depakote  - Fall precautions - Psych consulted - will evaluate for capacity   # Concern for Sepsis, unclear source: resolved. 4/4 SIRS (hypothermia to 94.8, tachycardia (per report), hypotensive to 98/65 and leucocytosis to 12.1) on presentation. Lactic acid 4.6, resolved after fluid. UA only with protienuria, clear CXR, and no skin rashes or open wounds, and no meningeal signs. Remains afebrile. WBC 5.3.  - F/u BCx - NGx5d - final  # Hypoglycemia, T2DM: Hypoglycemia resolved. CBG 12 - given 1amp of D50 prior to arrival to ED and another 4 amps of D50 given and initiated D5. History of AMS 2/2 hypoglycemia in the past. Last A1C 9.9 on 08/03/15. No clear records of insulin dosing at home, also on Metformin.  CBGs 148, 166, 158, 256 overnight.  - Monitor CBGs q4h - SSI thin  # Hypokalemia: mild,  K 3.4 > 4 after KDur. K 4.5 (4/17).  - Continue to monitor  # HTN: slightly hypertensive. Prescribed Lisinopril 20mg  and HCTZ 12.5 mg daily. BP 173/87, 137/73 overnight.  - Resume lisinopril given recent hypertension  # H/o multiple PEs in 07/2015: Prescribed lovenox at home. - Lovenox 90 mg q12h  # Atrial Fibrillation:  - Diltiazem 90 mg twice a day, Lovenox and Aspirin 81mg  - Avoid beta blockers in setting of cocaine use  # History of Alcohol Use/Illicit Drug Use: UDS positive for cocaine and benzos. CIWA scores overnight 2, 2, 1. - CIWA protocol - Thiamine, folate, and MVI daily  FEN/GI:  Heart healthy carb mod diet Prophylaxis: Lovenox  Disposition: pending medical improvement   Subjective:  No acute events overnight. Patient required one dose of Haldol yesterday for agitation, but was calm otherwise. Patient still awaiting psych evaluation for mental capacity.   Objective: Temp:  [97.9 F (36.6 C)-98.3 F (36.8 C)] 97.9 F (36.6 C) (04/18 0631) Pulse Rate:  [67-76] 76 (04/18 0631) Resp:  [18] 18 (04/18 0631) BP: (137-173)/(73-87) 137/73 mmHg (04/18 0631) SpO2:  [98 %-100 %] 98 % (04/18 0631) Physical Exam: General: resting comfortably in bed in NAD; easily able to arouse Cardiovascular: RRR, no murmurs appreciated Respiratory: CTAB, no wheezes or rhonchi Abdomen: soft, non-tender, non-distended, +BS Extremities: R AKA and L BKA, well-healing stumps, no edema or erythema Neuro: alert to person only; no focal deficits; answering questions appropriately though incorrectly  Laboratory:  Recent Labs Lab 09/23/15 0923 09/25/15 0312 09/27/15 0751  WBC 5.0 5.1 5.3  HGB 12.6* 12.3* 13.9  HCT 37.9* 38.2* 42.3  PLT 177 206 240    Recent Labs Lab 09/21/15 1607  09/25/15 0312 09/26/15 0340 09/27/15 1008  NA 140  < > 137 137 137  K 3.4*  < > 3.9 3.6 4.5  CL 104  < > 104 102 103  CO2 24  < > 24 25 22   BUN 16  < > 9 9 13   CREATININE 1.08  < > 0.95 1.01 1.11   CALCIUM 9.3  < > 9.8 9.8 9.9  PROT 6.9  --   --   --   --   BILITOT 0.6  --   --   --   --   ALKPHOS 52  --   --   --   --   ALT 16*  --   --   --   --   AST 19  --   --   --   --   GLUCOSE 97  < > 243* 184* 236*  < > = values in this interval not displayed. Imaging/Diagnostic Tests: Ct Head Wo Contrast 09/21/2015  CLINICAL DATA:  Patient found unresponsive on floor at home. Altered mental status. Initial encounter. EXAM: CT HEAD WITHOUT CONTRAST. IMPRESSION: 1. No evidence of traumatic intracranial injury or fracture. 2. Mild small vessel ischemic microangiopathy.   Dg Chest Portable 1 View 09/21/2015  CLINICAL DATA:  Altered mental status EXAM: PORTABLE CHEST 1 VIEW. IMPRESSION: Mild vascular congestion. Electronically Signed   By: Inez Catalina M.D.   On: 09/21/2015 16:44    [place], MD 09/28/2015, 7:29 AM PGY-1, [facility] Intern pager: [phone], text pages welcome

## 2015-09-27 NOTE — Progress Notes (Addendum)
CSW is continuing to follow pt for placement needs- CSW supervisor helping with placment  CSW sent referral to [facility]- they are reviewing clinicals for possible admission.  Domenica Reamer, [facility] Social Worker [phone]

## 2015-09-27 NOTE — Progress Notes (Signed)
Inpatient Diabetes Program Recommendations  AACE/ADA: New Consensus Statement on Inpatient Glycemic Control (2015)  Target Ranges:  Prepandial:   less than 140 mg/dL      Peak postprandial:   less than 180 mg/dL (1-2 hours)      Critically ill patients:  140 - 180 mg/dL   Results for HINES, CARRAHER (MRN [id number]) as of 09/27/2015 09:57  Ref. Range 09/26/2015 08:01 09/26/2015 10:33 09/26/2015 11:27 09/26/2015 15:55 09/27/2015 00:02 09/27/2015 05:00  Glucose-Capillary Latest Ref Range: 65-99 mg/dL 93 244 (H) 100 (H) 231 (H) 249 (H) 193 (H)   Review of Glycemic Control  Diabetes history: DM 2 Outpatient Diabetes medications: Lantus 45 units Current orders for Inpatient glycemic control: Lantus 25 units  Inpatient Diabetes Program Recommendations: Correction (SSI): Glucose ranging from 100-200's at times reaching above inpatient goal of 180. Please consider Novolog Correction + HS scale while inpatient. HgbA1C: order to assess prehospital glucose control   Thanks,  Tama Headings RN, MSN, [facility] Inpatient Diabetes Coordinator Team Pager [phone] (8a-5p)

## 2015-09-27 NOTE — Progress Notes (Signed)
Family Medicine Teaching Service Daily Progress Note Intern Pager: [phone]  Patient name: Steven [name] Medical record number: [id number] Date of birth: [birth year] Age: 55 y.o. Gender: male  Primary Care Provider: Chrisandra Netters, MD Consultants:  Psychiatry Code Status: FULL - discussed with patient after MS improved  Pt Overview and Major Events to Date:  4/11 - admitted to [facility]; vanc/zosyn started 4/13 - vanc/zosyn d/ced  Assessment and Plan: Timthy Nordmann is a 55 y.o. male presenting with altered mental status . 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  # Altered Mental Status: Head CT negative. Differential sepsis, hypoglycemia (CBG 12 prior to arrival to ED), seizure (nl EEG but valproic acid level <10), substance (UDS +cocaine and benzo) & EtOH (level <10). VBG 7.25/60/34/26. Ammonia normal. CIWA scores 1,0 overnight. Continued daily agitation requiring Haldol. - CIWA protocol - Haldol 5mg  PRN - Home Depakote  - Fall precautions - Psych consulted - will evaluate for capacity today  # Concern for Sepsis, unclear source: resolved. 4/4 SIRS (hypothermia to 94.8, tachycardia (per report), hypotensive to 98/65 and leucocytosis to 12.1) on presentation. Lactic acid 4.6, resolved after fluid. UA only with protienuria, clear CXR, and no skin rashes or open wounds, and no meningeal signs. Remains afebrile. WBC 5.3.  - F/u BCx - NGx5d - final  # Hypoglycemia, T2DM: Hypoglycemia resolved. CBG 12 - given 1amp of D50 prior to arrival to ED and another 4 amps of D50 given and initiated D5. History of AMS 2/2 hypoglycemia in the past. Last A1C 9.9 on 08/03/15. No clear records of insulin dosing at home, also on Metformin.  CBGs 193, 249 overnight.  - Monitor CBGs q4h - SSI thin  # Hypokalemia: mild, K 3.4 > 4 after KDur. K 4.5 this AM.  -  Continue to monitor  # HTN: slightly hypertensive. Prescribed Lisinopril 20mg  and HCTZ 12.5 mg daily. BP 135/76, 140/83 overnight.  - Monitor closely and resume antihypertensives as indicated  # H/o multiple PEs in 07/2015: Prescribed lovenox at home. - Lovenox 90 mg q12h  # Atrial Fibrillation:  - Diltiazem 90 mg twice a day, Lovenox and Aspirin 81mg  - Avoid beta blockers in setting of cocaine use  # History of Alcohol Use/Illicit Drug Use: UDS positive for cocaine and benzos. CIWA scores overnight 1,0. - CIWA protocol - Thiamine, folate, and MVI daily  FEN/GI:  Heart healthy carb mod diet Prophylaxis: Lovenox  Disposition: pending medical improvement   Subjective:  Patient very aggressive yesterday, and threw a table across the room. Has been requiring Haldol for agitation daily. No acute events overnight after receiving Haldol. Oriented to person and place this AM, but only repeats back any other questions asked.   Objective: Temp:  [98 F (36.7 C)-98.3 F (36.8 C)] 98 F (36.7 C) (04/17 0502) Pulse Rate:  [69-73] 69 (04/17 0502) Resp:  [18] 18 (04/17 0502) BP: (135-140)/(76-83) 140/83 mmHg (04/17 0502) SpO2:  [98 %-99 %] 99 % (04/17 0502) Weight:  [190 lb 12.8 oz (86.546 kg)] 190 lb 12.8 oz (86.546 kg) (04/17 0502) Physical Exam: General: alert, sitting up in bed in NAD eating breakfast Cardiovascular: RRR, no murmurs appreciated Respiratory: CTAB, no wheezes or rhonchi Abdomen: soft, non-tender, non-distended, +BS Extremities: R AKA and L BKA, well-healing stumps, no edema or erythema Neuro: alert to person and place; no focal deficits; repeats back any questions other than person  and place  Laboratory:  Recent Labs Lab 09/23/15 0923 09/25/15 0312 09/27/15 0751  WBC 5.0 5.1 5.3  HGB 12.6* 12.3* 13.9  HCT 37.9* 38.2* 42.3  PLT 177 206 240    Recent Labs Lab 09/21/15 1607  09/25/15 0312 09/26/15 0340 09/27/15 1008  NA 140  < > 137 137 137  K 3.4*  < >  3.9 3.6 4.5  CL 104  < > 104 102 103  CO2 24  < > 24 25 22   BUN 16  < > 9 9 13   CREATININE 1.08  < > 0.95 1.01 1.11  CALCIUM 9.3  < > 9.8 9.8 9.9  PROT 6.9  --   --   --   --   BILITOT 0.6  --   --   --   --   ALKPHOS 52  --   --   --   --   ALT 16*  --   --   --   --   AST 19  --   --   --   --   GLUCOSE 97  < > 243* 184* 236*  < > = values in this interval not displayed. Imaging/Diagnostic Tests: Ct Head Wo Contrast 09/21/2015  CLINICAL DATA:  Patient found unresponsive on floor at home. Altered mental status. Initial encounter. EXAM: CT HEAD WITHOUT CONTRAST. IMPRESSION: 1. No evidence of traumatic intracranial injury or fracture. 2. Mild small vessel ischemic microangiopathy.   Dg Chest Portable 1 View 09/21/2015  CLINICAL DATA:  Altered mental status EXAM: PORTABLE CHEST 1 VIEW. IMPRESSION: Mild vascular congestion. Electronically Signed   By: Inez Catalina M.D.   On: 09/21/2015 16:44    [employer], MD 09/27/2015, 11:56 AM PGY-1, [place] Intern pager: [phone], text pages welcome

## 2015-09-28 LAB — GLUCOSE, CAPILLARY
GLUCOSE-CAPILLARY: 256 mg/dL — AB (ref 65–99)
Glucose-Capillary: 148 mg/dL — ABNORMAL HIGH (ref 65–99)
Glucose-Capillary: 211 mg/dL — ABNORMAL HIGH (ref 65–99)

## 2015-09-28 LAB — RPR: RPR Ser Ql: NONREACTIVE

## 2015-09-28 LAB — HIV ANTIBODY (ROUTINE TESTING W REFLEX): HIV Screen 4th Generation wRfx: NONREACTIVE

## 2015-09-28 MED ORDER — INSULIN ASPART 100 UNIT/ML ~~LOC~~ SOLN: 0.0000 [IU] | Freq: Three times a day (TID) | SUBCUTANEOUS | Status: AC

## 2015-09-28 MED ORDER — DILTIAZEM HCL ER 90 MG PO CP12: 90.0000 mg | ORAL_CAPSULE | Freq: Two times a day (BID) | ORAL | Status: AC

## 2015-09-28 MED ORDER — INSULIN GLARGINE 100 UNIT/ML ~~LOC~~ SOLN: 25.0000 [IU] | Freq: Every day | SUBCUTANEOUS | Status: AC

## 2015-09-28 MED ORDER — INSULIN ASPART 100 UNIT/ML ~~LOC~~ SOLN: 0.0000 [IU] | Freq: Three times a day (TID) | SUBCUTANEOUS | Status: DC

## 2015-09-28 MED ORDER — HALOPERIDOL 5 MG PO TABS: 5.0000 mg | ORAL_TABLET | Freq: Four times a day (QID) | ORAL | Status: AC | PRN

## 2015-09-28 MED ORDER — ATORVASTATIN CALCIUM 40 MG PO TABS: 40.0000 mg | ORAL_TABLET | Freq: Every day | ORAL | Status: AC

## 2015-09-28 MED ORDER — DIVALPROEX SODIUM 500 MG PO DR TAB: 500.0000 mg | DELAYED_RELEASE_TABLET | Freq: Two times a day (BID) | ORAL | Status: AC

## 2015-09-28 MED ORDER — ENOXAPARIN SODIUM 150 MG/ML ~~LOC~~ SOLN: 1.0000 mg/kg | Freq: Two times a day (BID) | SUBCUTANEOUS | Status: AC

## 2015-09-28 MED ORDER — ENOXAPARIN SODIUM 100 MG/ML ~~LOC~~ SOLN: 1.0000 mg/kg | Freq: Two times a day (BID) | SUBCUTANEOUS | Status: DC

## 2015-09-28 MED ORDER — LISINOPRIL 10 MG PO TABS
20.0000 mg | ORAL_TABLET | Freq: Every day | ORAL | Status: DC
  Administered 2015-09-28: 20 mg via ORAL
  Filled 2015-09-28: qty 4

## 2015-09-28 NOTE — Clinical Social Work Placement (Signed)
   CLINICAL SOCIAL WORK PLACEMENT  NOTE  Date:  09/28/2015  Patient Details  Name: Steven [name] MRN: [id number] Date of Birth: July 26, 1960  Clinical Social Work is seeking post-discharge placement for this patient at the [place] level of care (*CSW will initial, date and re-position this form in  chart as items are completed):  Yes   Patient/family provided with [facility] Work Department's list of facilities offering this level of care within the geographic area requested by the patient (or if unable, by the patient's family).  Yes   Patient/family informed of their freedom to choose among providers that offer the needed level of care, that participate in Medicare, Medicaid or managed care program needed by the patient, have an available bed and are willing to accept the patient.  Yes   Patient/family informed of [facility]'s ownership interest in [facility] and [facility], as well as of the fact that they are under no obligation to receive care at these facilities.  PASRR submitted to EDS on       PASRR number received on       Existing PASRR number confirmed on 09/24/15     FL2 transmitted to all facilities in geographic area requested by pt/family on 09/24/15     FL2 transmitted to all facilities within larger geographic area on       Patient informed that his/her managed care company has contracts with or will negotiate with certain facilities, including the following:        Yes   Patient/family informed of bed offers received.  Patient chooses bed at Other - please specify in the comment section below: [facility])     Physician recommends and patient chooses bed at      Patient to be transferred to Other - please specify in the comment section below: [facility]) on 09/28/15.  Patient to be transferred to facility by Ambulance     Patient family notified on 09/28/15 of transfer.  Name of family  member notified:  Pam     PHYSICIAN Please prepare priority discharge summary, including medications, Please prepare prescriptions, Please sign FL2     Additional Comment:  Per MD patient ready for DC to [facility]. RN, patient, patient's family, and facility notified of DC. RN given number for report. DC packet on chart. Ambulance transport requested for patient. CSW AD has signed patient in. CSW signing off.   _______________________________________________ Rigoberto Noel, LCSW 09/28/2015, 3:02 PM

## 2015-09-28 NOTE — Clinical Social Work Note (Signed)
Patient has been offered a bed by [facility]. CSW AD to assist with signing patient into the facility and will have conversation with sister regarding need to assume responsibility for signing patient in once able. CSW has paged MD to request DC documents.  Liz Beach MSW, [place], [place], [id number]

## 2015-09-28 NOTE — Progress Notes (Signed)
Pt in stable condition,iv taken out, report called in to the nurse at [facility].pt taken off the floor by EMS

## 2015-09-28 NOTE — Discharge Instructions (Signed)
Confusion Confusion is the inability to think with your usual speed or clarity. Confusion may come on quickly or slowly over time. How quickly the confusion comes on depends on the cause. Confusion can be due to any number of causes. CAUSES   Concussion, head injury, or head trauma.  Seizures.  Stroke.  Fever.  Brain tumor.  Age related decreased brain function (dementia).  Heightened emotional states like rage or terror.  Mental illness in which the person loses the ability to determine what is real and what is not (hallucinations).  Infections such as a urinary tract infection (UTI).  Toxic effects from alcohol, drugs, or prescription medicines.  Dehydration and an imbalance of salts in the body (electrolytes).  Lack of sleep.  Low blood sugar (diabetes).  Low levels of oxygen from conditions such as chronic lung disorders.  Drug interactions or other medicine side effects.  Nutritional deficiencies, especially niacin, thiamine, vitamin C, or vitamin B.  Sudden drop in body temperature (hypothermia).  Change in routine, such as when traveling or hospitalized. SIGNS AND SYMPTOMS  People often describe their thinking as cloudy or unclear when they are confused. Confusion can also include feeling disoriented. That means you are unaware of where or who you are. You may also not know what the date or time is. If confused, you may also have difficulty paying attention, remembering, and making decisions. Some people also act aggressively when they are confused.  DIAGNOSIS  The medical evaluation of confusion may include:  Blood and urine tests.  X-rays.  Brain and nervous system tests.  Analyzing your brain waves (electroencephalogram or EEG).  Magnetic resonance imaging (MRI) of your head.  Computed tomography (CT) scan of your head.  Mental status tests in which your health care provider may ask many questions. Some of these questions may seem silly or strange,  but they are a very important test to help diagnose and treat confusion. TREATMENT  An admission to the hospital may not be needed, but a person with confusion should not be left alone. Stay with a family member or friend until the confusion clears. Avoid alcohol, pain relievers, or sedative drugs until you have fully recovered. Do not drive until directed by your health care provider. HOME CARE INSTRUCTIONS  What family and friends can do:  To find out if someone is confused, ask the person to state his or her name, age, and the date. If the person is unsure or answers incorrectly, he or she is confused.  Always introduce yourself, no matter how well the person knows you.  Often remind the person of his or her location.  Place a calendar and clock near the confused person.  Help the person with his or her medicines. You may want to use a pill box, an alarm as a reminder, or give the person each dose as prescribed.  Talk about current events and plans for the day.  Try to keep the environment calm, quiet, and peaceful.  Make sure the person keeps follow-up visits with his or her health care provider. PREVENTION  Ways to prevent confusion:  Avoid alcohol.  Eat a balanced diet.  Get enough sleep.  Take medicine only as directed by your health care provider.  Do not become isolated. Spend time with other people and make plans for your days.  Keep careful watch on your blood sugar levels if you are diabetic. SEEK IMMEDIATE MEDICAL CARE IF:   You develop severe headaches, repeated vomiting, seizures, blackouts, or   slurred speech.  There is increasing confusion, weakness, numbness, restlessness, or personality changes.  You develop a loss of balance, have marked dizziness, feel uncoordinated, or fall.  You have delusions, hallucinations, or develop severe anxiety.  Your family members think you need to be rechecked.   This information is not intended to replace advice given  to you by your health care provider. Make sure you discuss any questions you have with your health care provider.   Document Released: 07/06/2004 Document Revised: 06/19/2014 Document Reviewed: 07/04/2013 Elsevier Interactive Patient Education 2016 [employer]  

## 2016-02-11 DEATH — deceased

## 2017-01-04 IMAGING — CR DG CHEST 2V
2 series · 2 of 2 positions shown · non-contrast
Comparison: 08/08/2015

CLINICAL DATA: Dementia with agitation

EXAM:
CHEST  2 VIEW

[x chest ap]
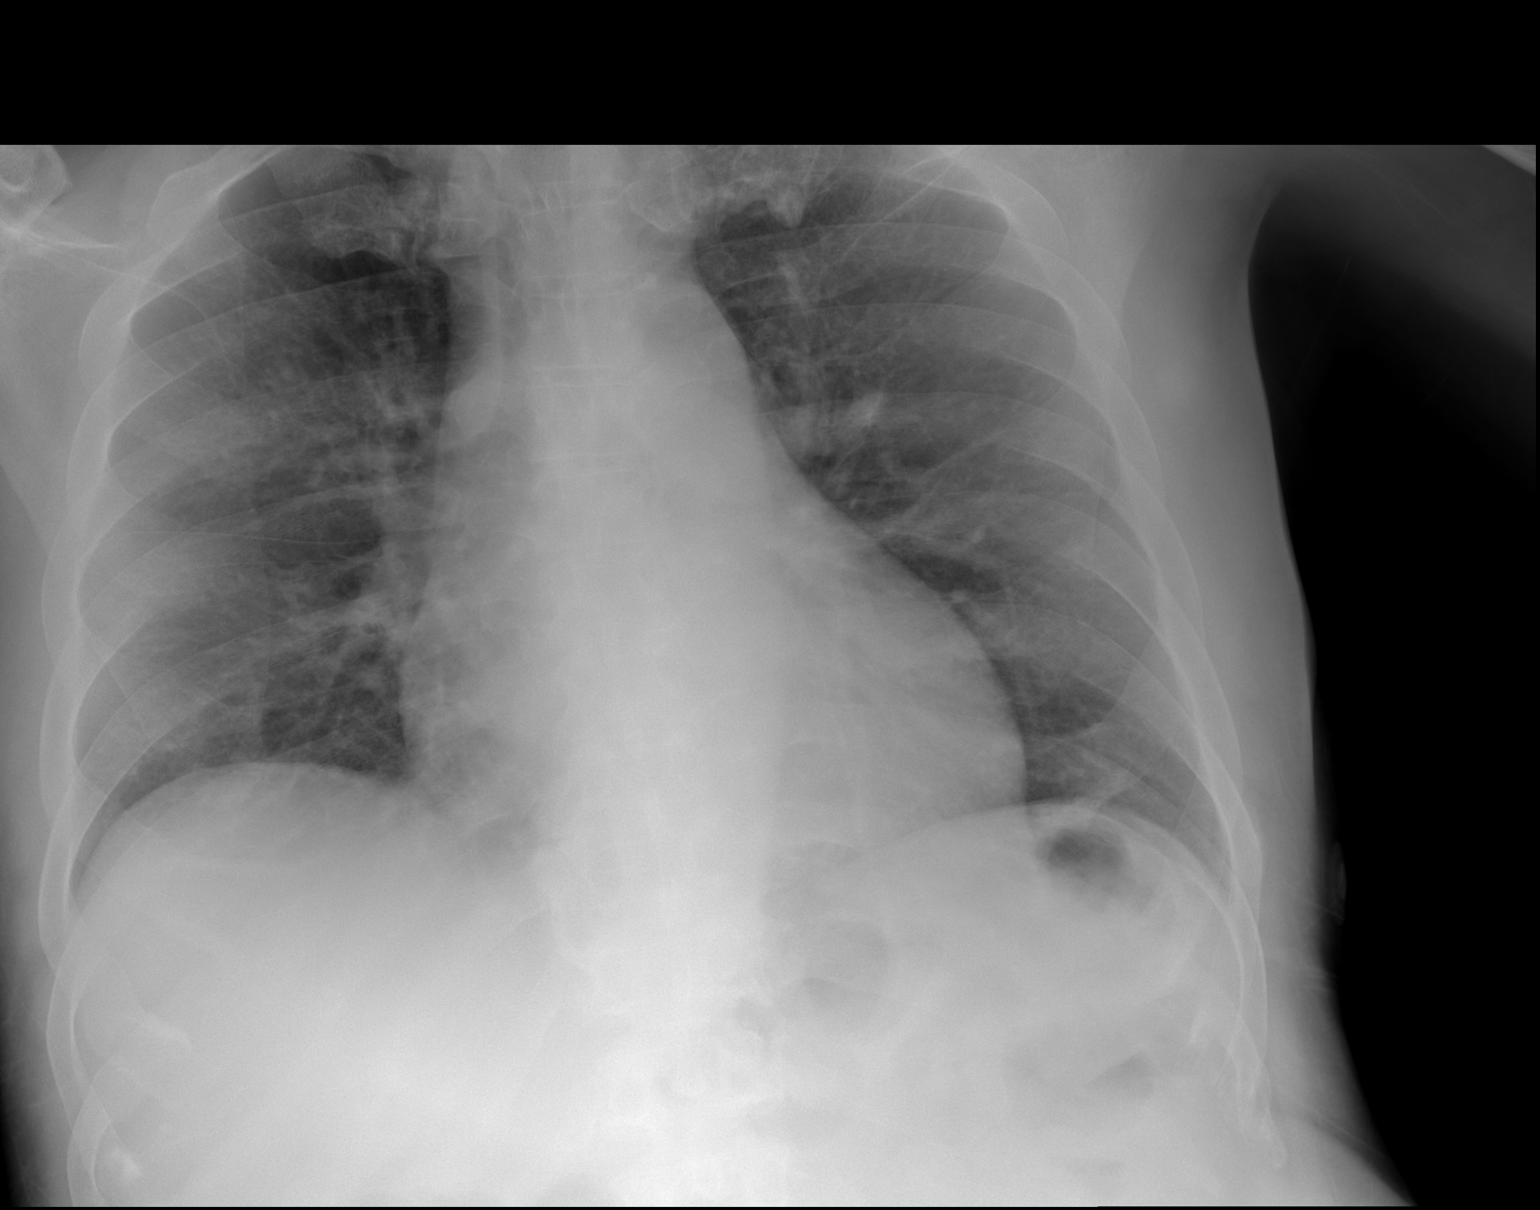

[w chest lat]
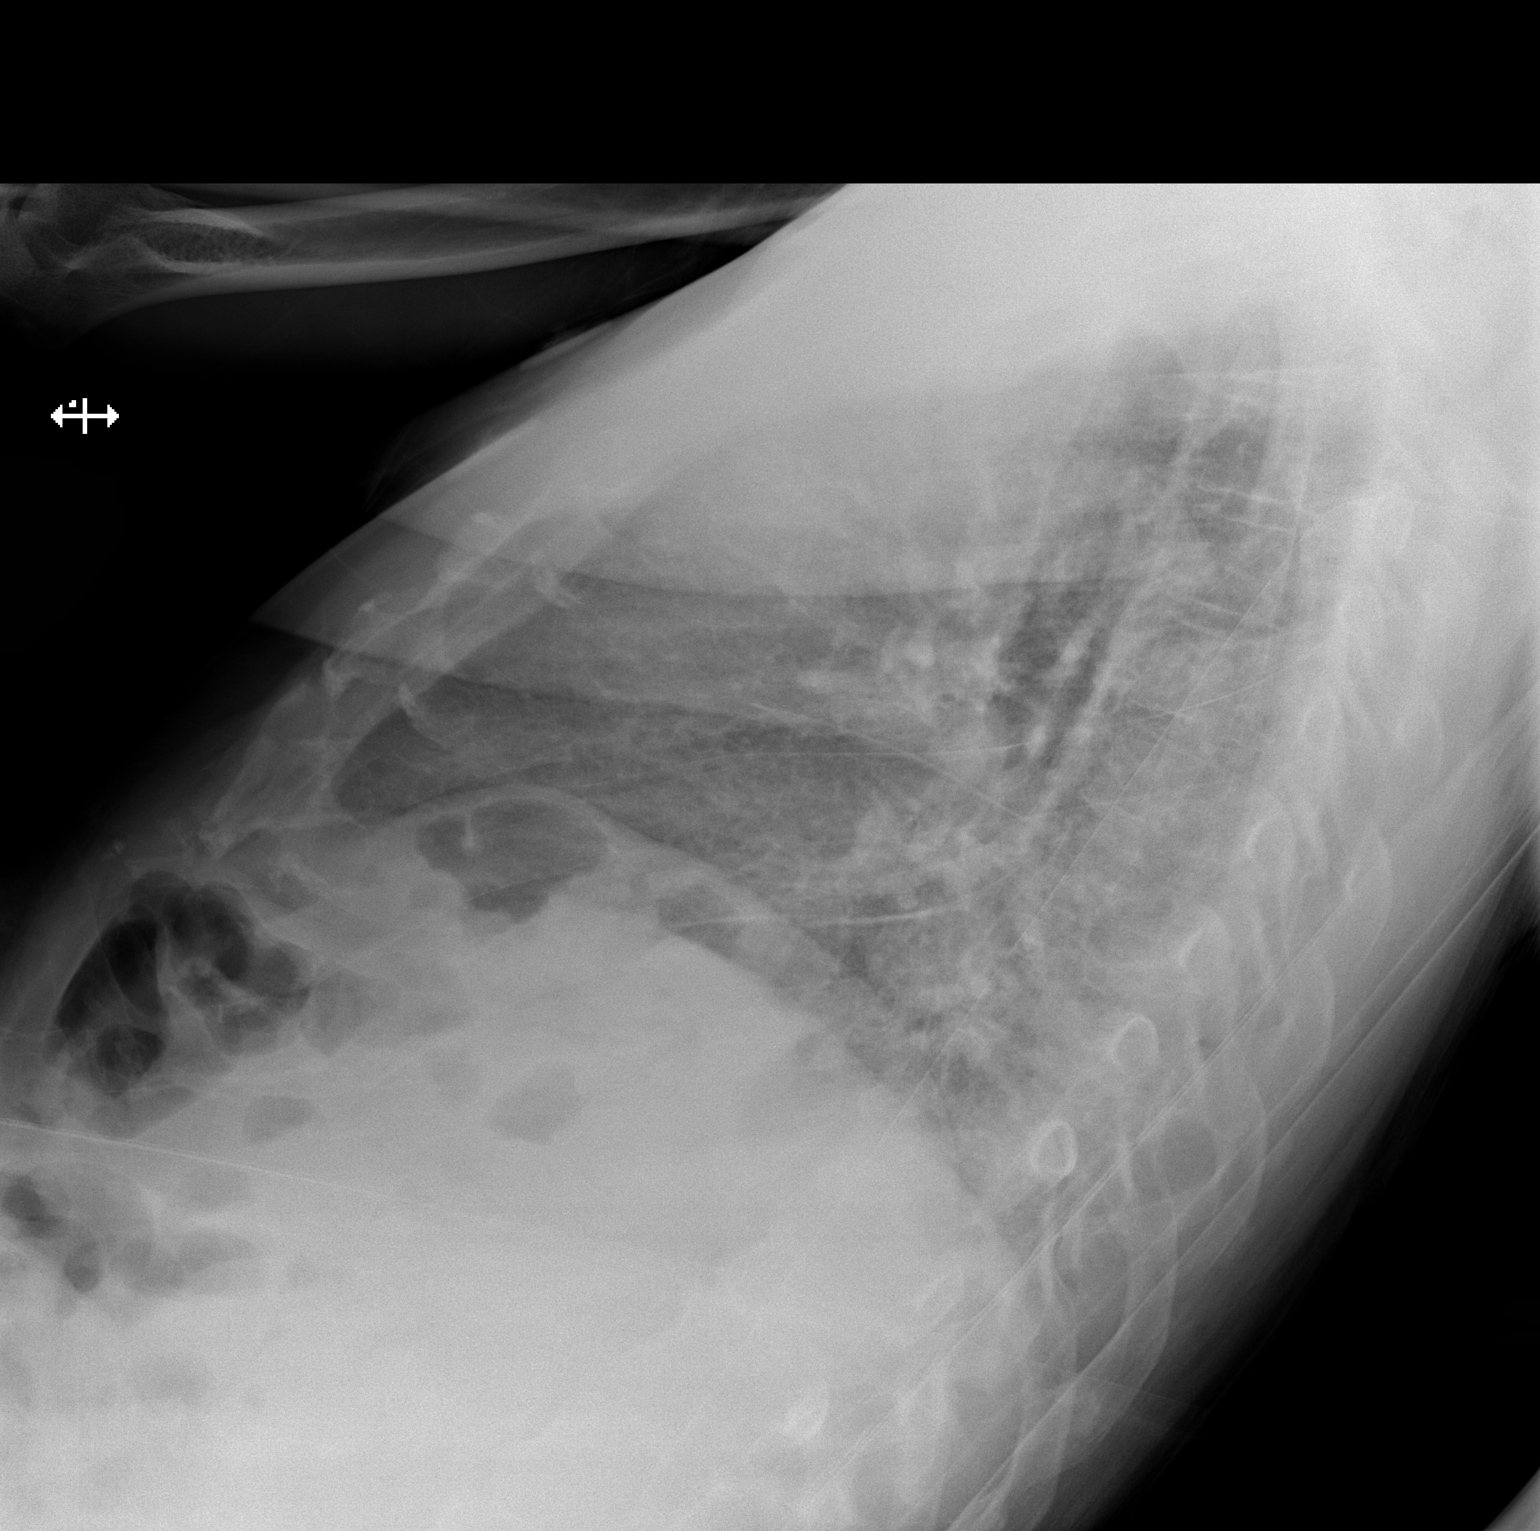

[2 of 2 positions shown; findings below may reference images not displayed]

FINDINGS: Cardiac shadow is at the upper limits of normal in size but stable.
The lungs are well aerated bilaterally. Mild increased central
vascular congestion is noted but improved from the prior exam. No
focal infiltrate is seen.
IMPRESSION: Mild vascular congestion but improved from the prior exam.
